# Patient Record
Sex: Female | Born: 1960
Health system: Southern US, Community
[De-identification: ages and names within clinical notes are randomized; demographics above are authoritative.]

## PROBLEM LIST (undated history)

## (undated) DIAGNOSIS — R112 Nausea with vomiting, unspecified: Secondary | ICD-10-CM

## (undated) DIAGNOSIS — E041 Nontoxic single thyroid nodule: Secondary | ICD-10-CM

## (undated) DIAGNOSIS — M8580 Other specified disorders of bone density and structure, unspecified site: Secondary | ICD-10-CM

## (undated) DIAGNOSIS — K589 Irritable bowel syndrome without diarrhea: Secondary | ICD-10-CM

## (undated) DIAGNOSIS — T7840XA Allergy, unspecified, initial encounter: Secondary | ICD-10-CM

## (undated) DIAGNOSIS — E785 Hyperlipidemia, unspecified: Secondary | ICD-10-CM

## (undated) DIAGNOSIS — C801 Malignant (primary) neoplasm, unspecified: Secondary | ICD-10-CM

## (undated) DIAGNOSIS — Z9889 Other specified postprocedural states: Secondary | ICD-10-CM

## (undated) HISTORY — DX: Allergy, unspecified, initial encounter: T78.40XA

## (undated) HISTORY — DX: Irritable bowel syndrome, unspecified: K58.9

## (undated) HISTORY — DX: Hyperlipidemia, unspecified: E78.5

## (undated) HISTORY — DX: Other specified disorders of bone density and structure, unspecified site: M85.80

## (undated) HISTORY — DX: Malignant (primary) neoplasm, unspecified: C80.1

---

## 1986-10-16 HISTORY — PX: DILATION AND CURETTAGE OF UTERUS: SHX78

## 1989-10-16 HISTORY — PX: ABDOMINAL HYSTERECTOMY: SHX81

## 2000-08-16 ENCOUNTER — Encounter: Admission: RE | Admit: 2000-08-16 | Discharge: 2000-08-16 | Payer: Self-pay | Admitting: Family Medicine

## 2000-08-16 ENCOUNTER — Encounter: Payer: Self-pay | Admitting: Family Medicine

## 2001-06-26 ENCOUNTER — Other Ambulatory Visit: Admission: RE | Admit: 2001-06-26 | Discharge: 2001-06-26 | Payer: Self-pay | Admitting: Obstetrics and Gynecology

## 2003-12-08 ENCOUNTER — Encounter: Admission: RE | Admit: 2003-12-08 | Discharge: 2003-12-08 | Payer: Self-pay | Admitting: Family Medicine

## 2003-12-10 ENCOUNTER — Other Ambulatory Visit: Admission: RE | Admit: 2003-12-10 | Discharge: 2003-12-10 | Payer: Self-pay | Admitting: Pediatrics

## 2004-12-08 ENCOUNTER — Encounter: Admission: RE | Admit: 2004-12-08 | Discharge: 2004-12-08 | Payer: Self-pay | Admitting: Family Medicine

## 2005-03-29 ENCOUNTER — Ambulatory Visit: Payer: Self-pay | Admitting: Internal Medicine

## 2005-04-28 ENCOUNTER — Encounter: Admission: RE | Admit: 2005-04-28 | Discharge: 2005-04-28 | Payer: Self-pay | Admitting: Obstetrics and Gynecology

## 2005-04-28 IMAGING — US US PELVIS COMPLETE MODIFY
1 series · 13 of 25 positions shown · non-contrast
Comparison: None.

CLINICAL DATA: 44-year-old female, status-post hysterectomy with pelvic pain. 
TRANSABDOMINAL AND TRANSVAGINAL PELVIC ULTRASOUND:
TECHNIQUE: Both transabdominal and transvaginal ultrasound examinations of the pelvis were performed including evaluation of the uterus, ovaries, adnexal regions, and pelvic cul-de-sac.

[Series 1: unknown · 0.29mm/px · 13 of 59 slices shown]
[im 1/59]
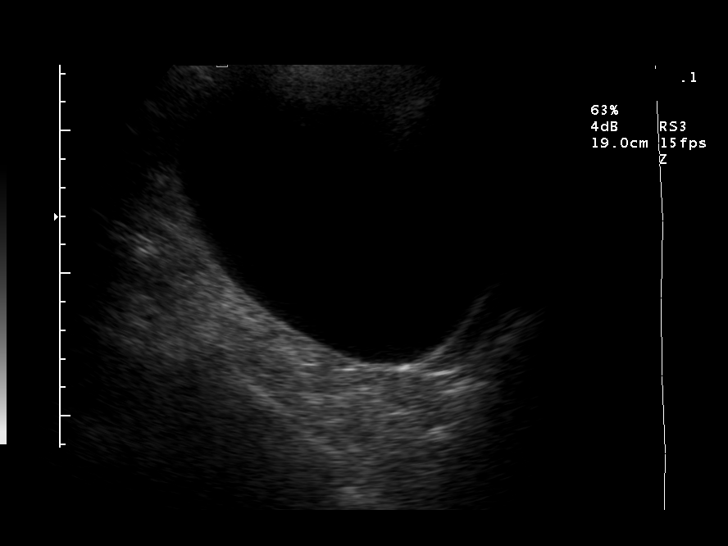
[im 5/59]
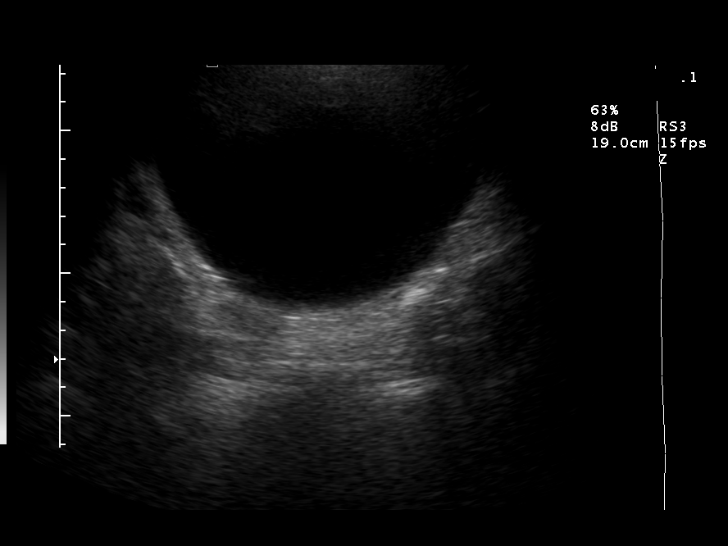
[im 10/59]
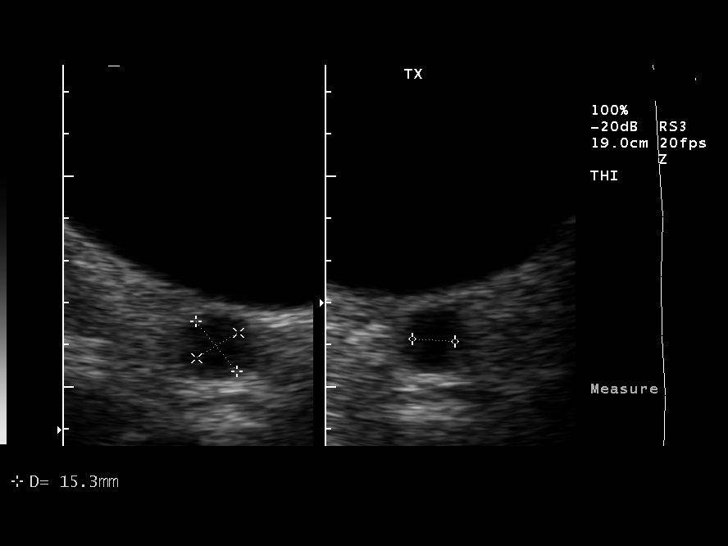
[im 15/59]
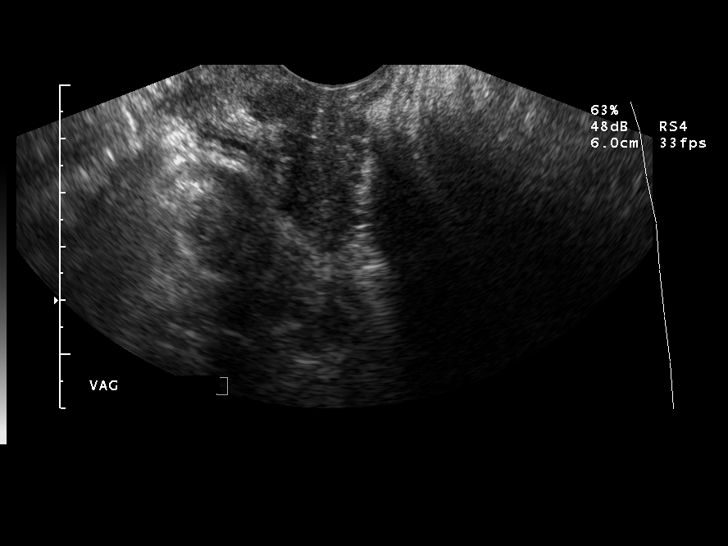
[im 20/59]
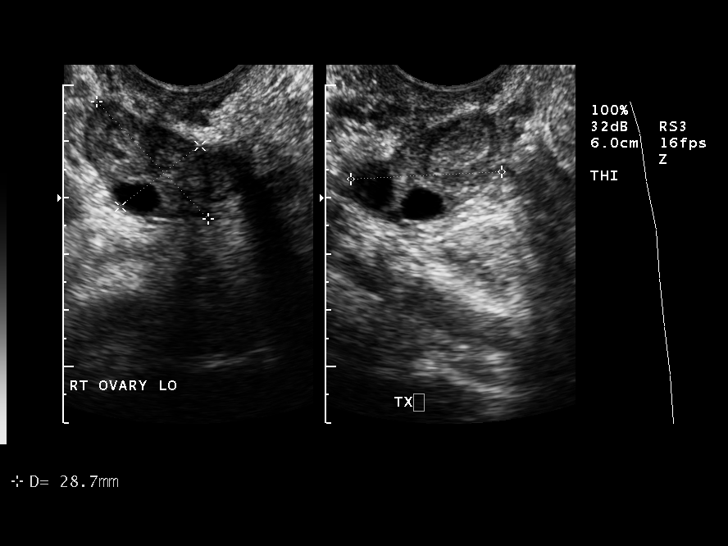
[im 25/59]
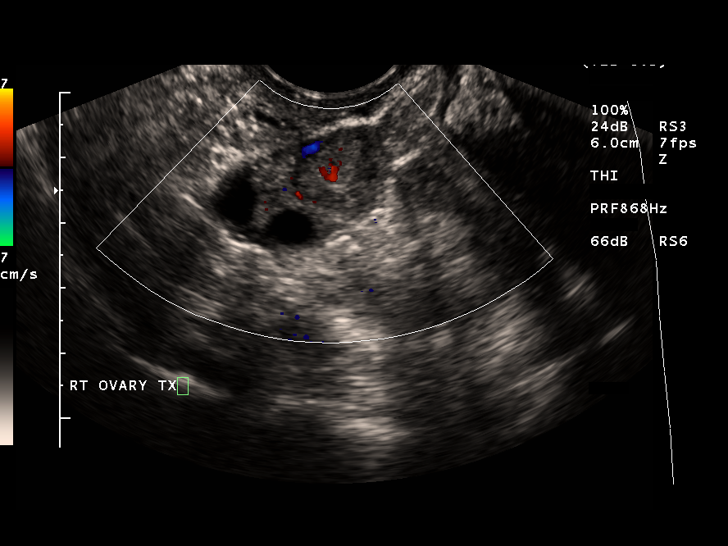
[im 30/59]
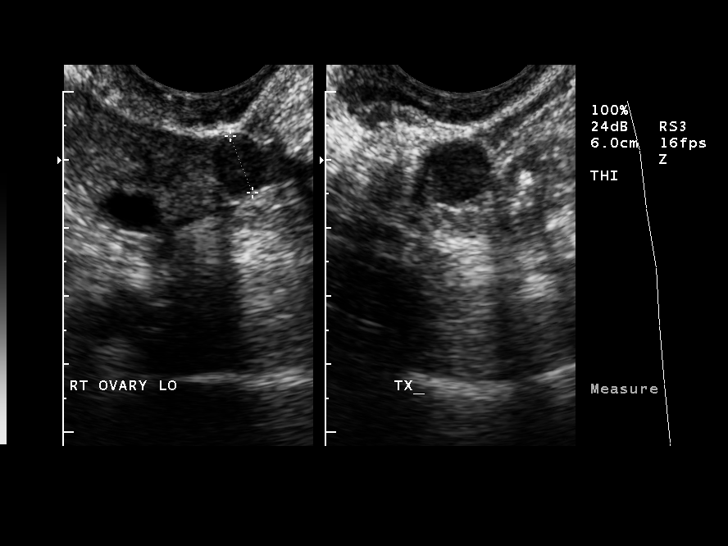
[im 34/59]
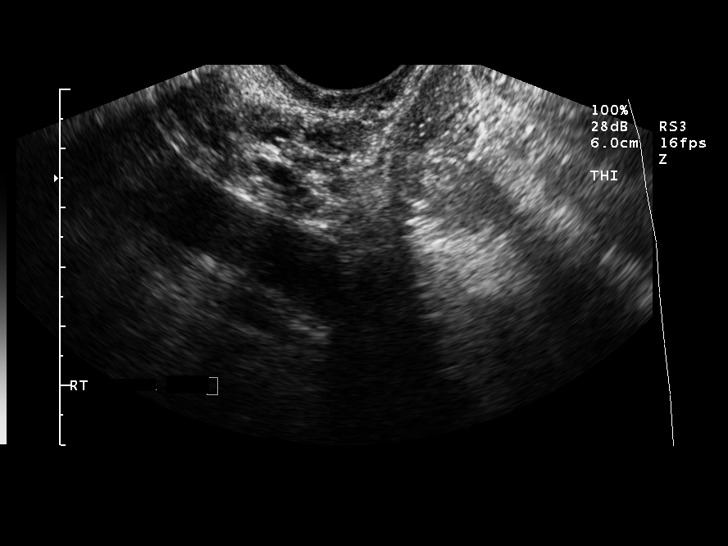
[im 39/59]
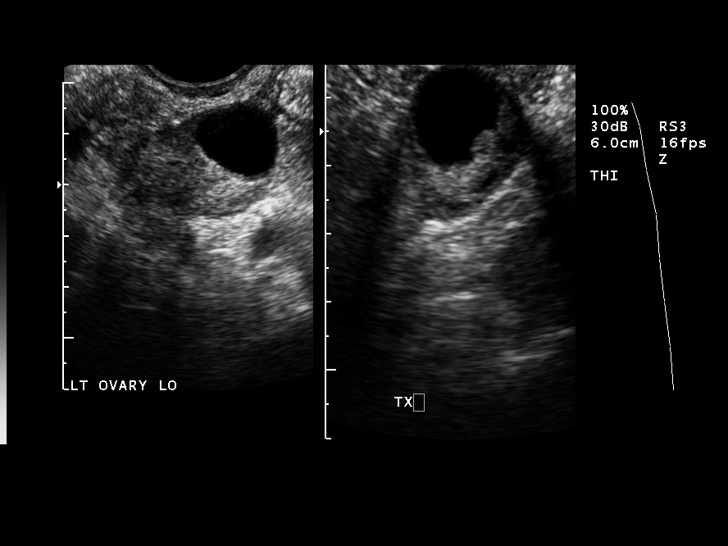
[im 44/59]
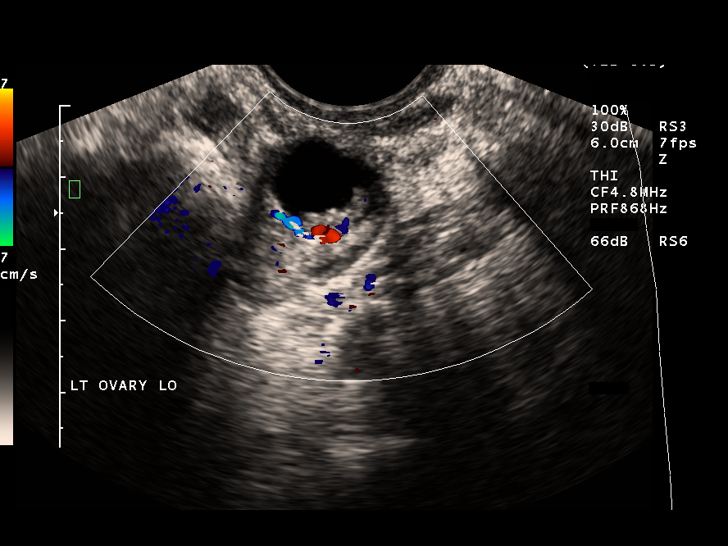
[im 49/59]
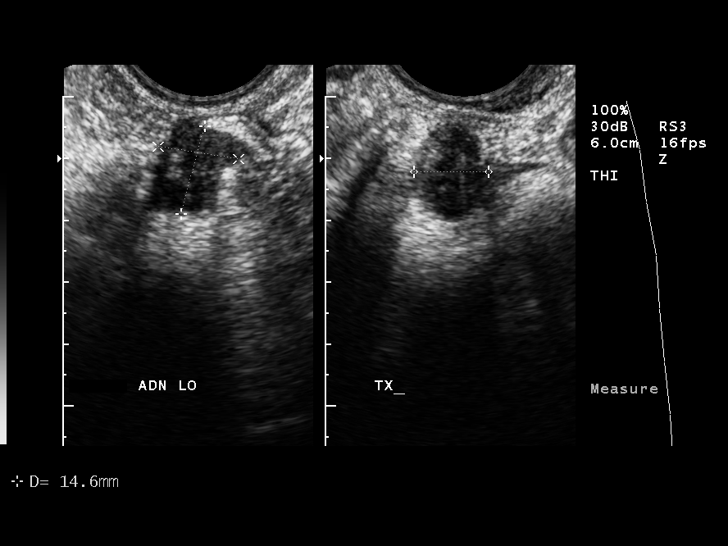
[im 54/59]
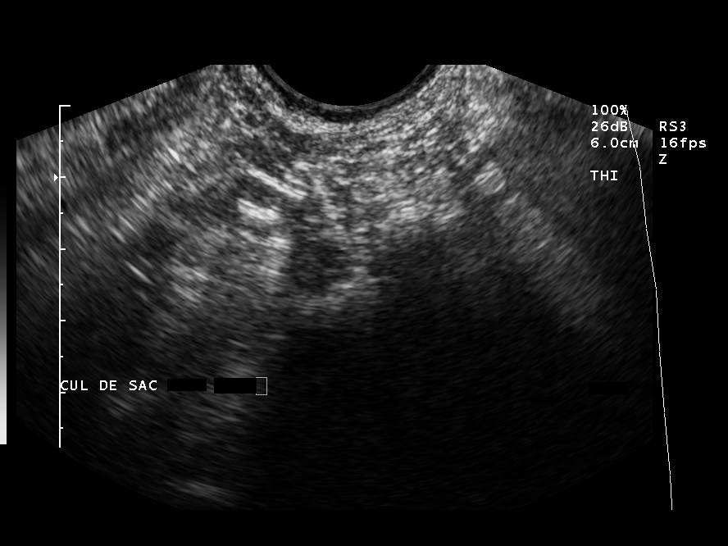
[im 59/59]
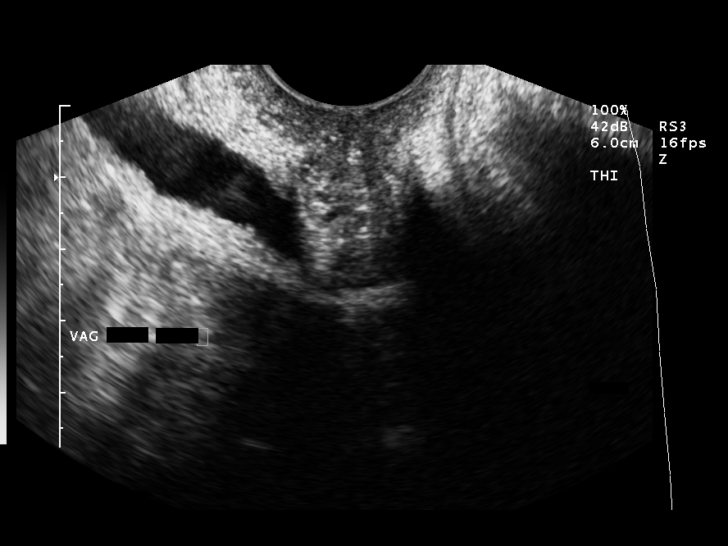

[13 of 25 positions shown; findings below may reference images not displayed]

FINDINGS: The uterus is surgically absent.  
  The right ovary measures 2.9 x 1.8 x 2.7 cm.  Within the right ovary, there is a 16 x 15 x 14 mm hypoechoic solid-appearing area which is non-specific in appearance and may represent a hemorrhagic or debris filled ovarian follicle versus small cyst.  
The left ovary measures 3.3 x 2.0 x 2.0 cm.  The left ovary contains a 19 x 15 x 16 mm complex cyst with septation and an area of marginal nodularity versus debris.  
Additionally, adjacent to the left ovary but separate, there is a 15 x 13 x 12 mm hypoechoic mid  left adnexal abnormality without significant vascularity however this does not appear to peristalse like bowel.  Adjacent adnexal masse is not excluded. 
No free fluid.
IMPRESSION: 1.  Surgically absent uterus.  
2.  16 mm right ovarian hypoechoic lesion, query hemorrhagic or debris-filled cyst.  
3.  19 mm complex left ovarian septated cyst with peripheral nodular component versus layering debris.  
4.  15 mm midleft adnexal paraovarian hypoechoic lesion which appears separate from the ovary.  Adnexal mass is not excluded. 
5.  Further evaluation could be performed with pelvic MRI or followup ultrasound following two menstrual cycles.  l

## 2005-09-01 ENCOUNTER — Ambulatory Visit: Payer: Self-pay | Admitting: Internal Medicine

## 2005-12-12 ENCOUNTER — Encounter: Admission: RE | Admit: 2005-12-12 | Discharge: 2005-12-12 | Payer: Self-pay | Admitting: Obstetrics and Gynecology

## 2005-12-12 IMAGING — MG MM MAMMO SCREENING
4 series · 4 of 4 positions shown · non-contrast
Comparison: none

SCREENING MAMMOGRAM:
There is a fibroglandular pattern.  A possible mass is noted in the left breast.  Spot compression 
views and possibly sonography are recommended for further evaluation.  In the right breast, no 
masses or malignant type calcifications are identified.  Compared with prior studies.

[R CC]
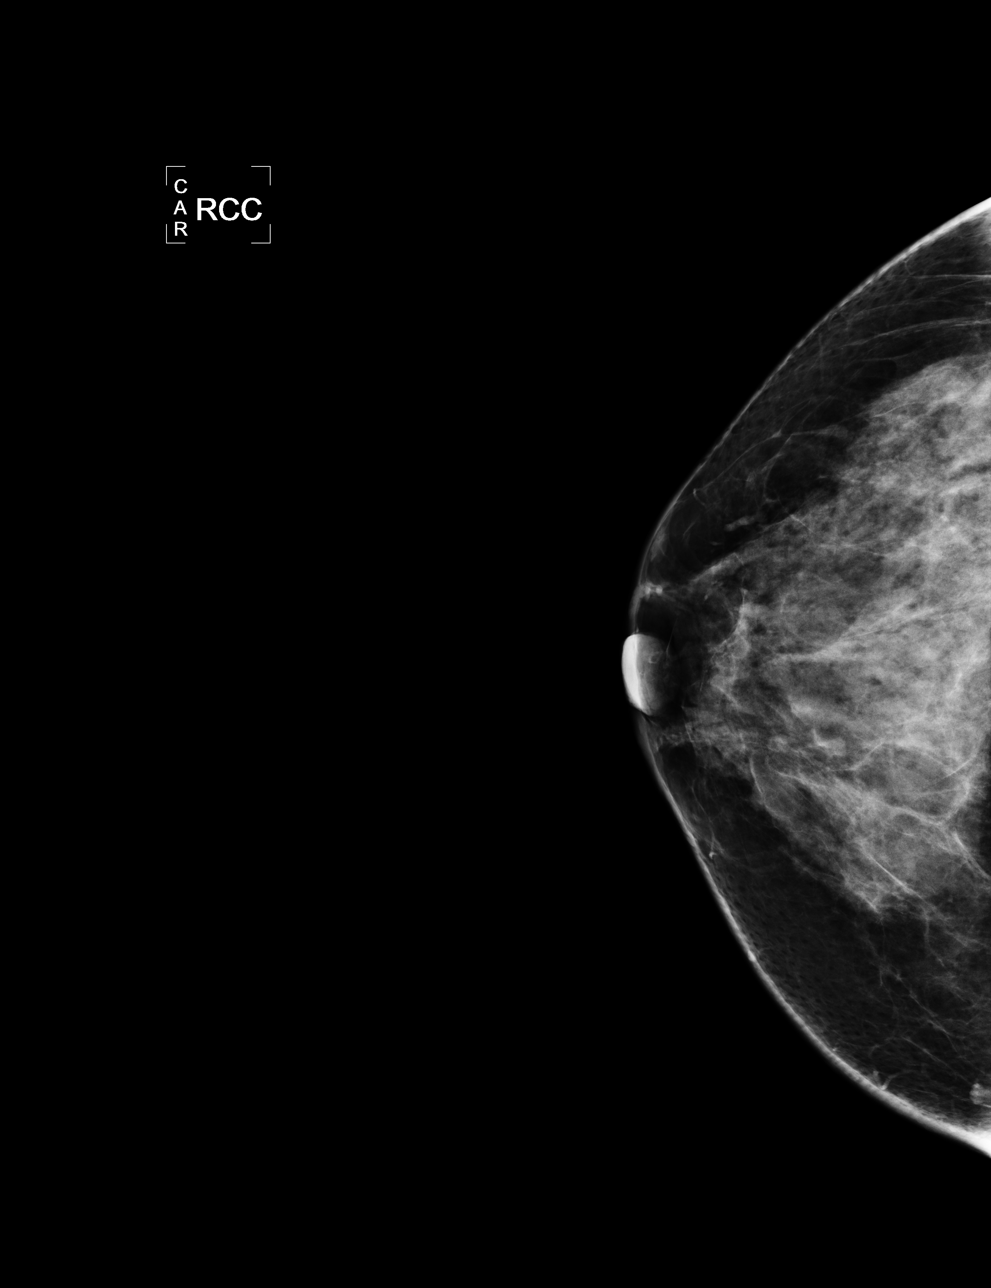

[L CC]
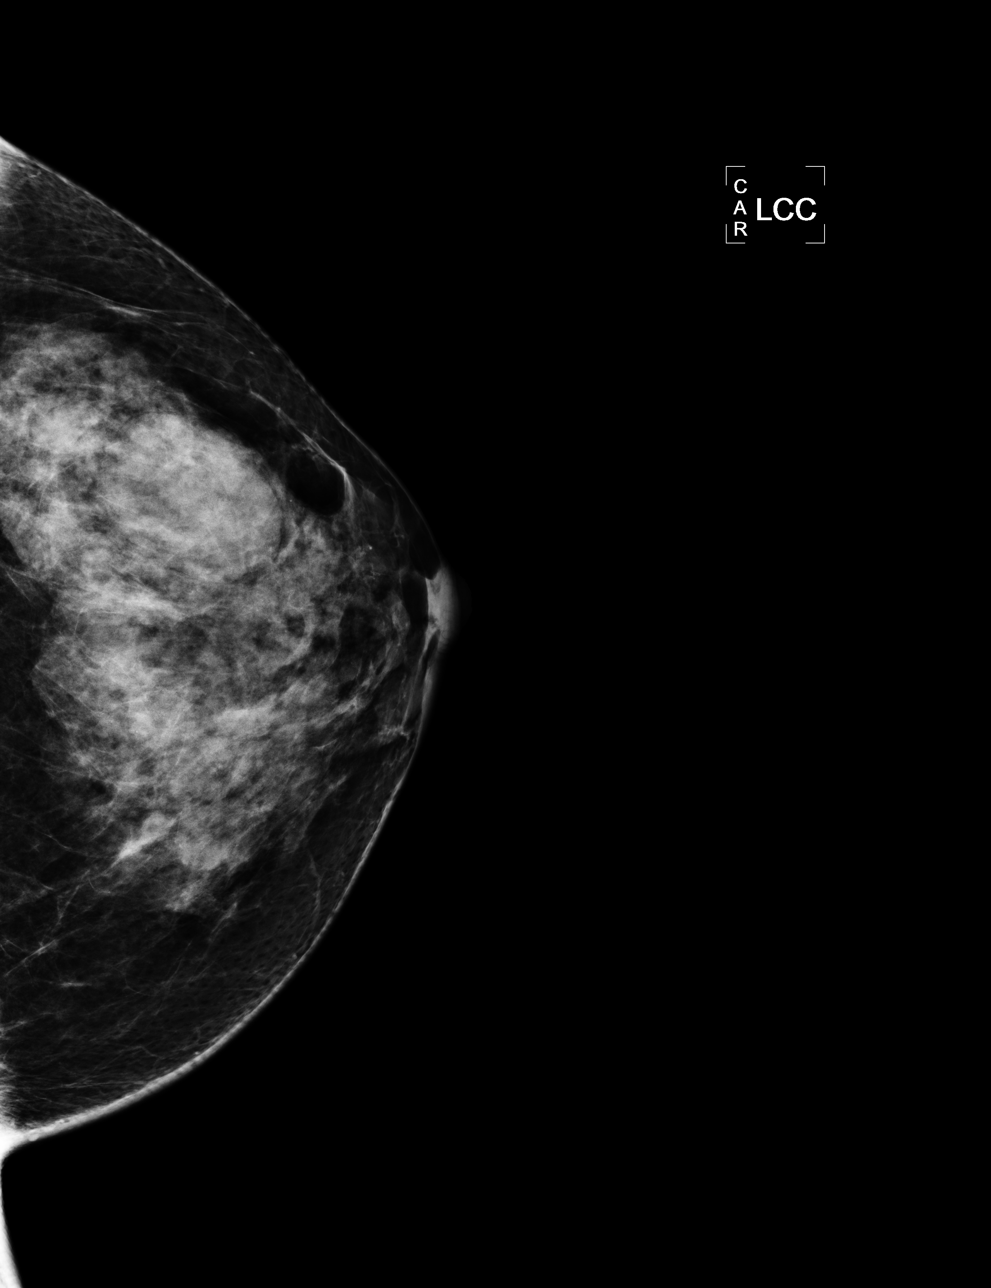

[L MLO]
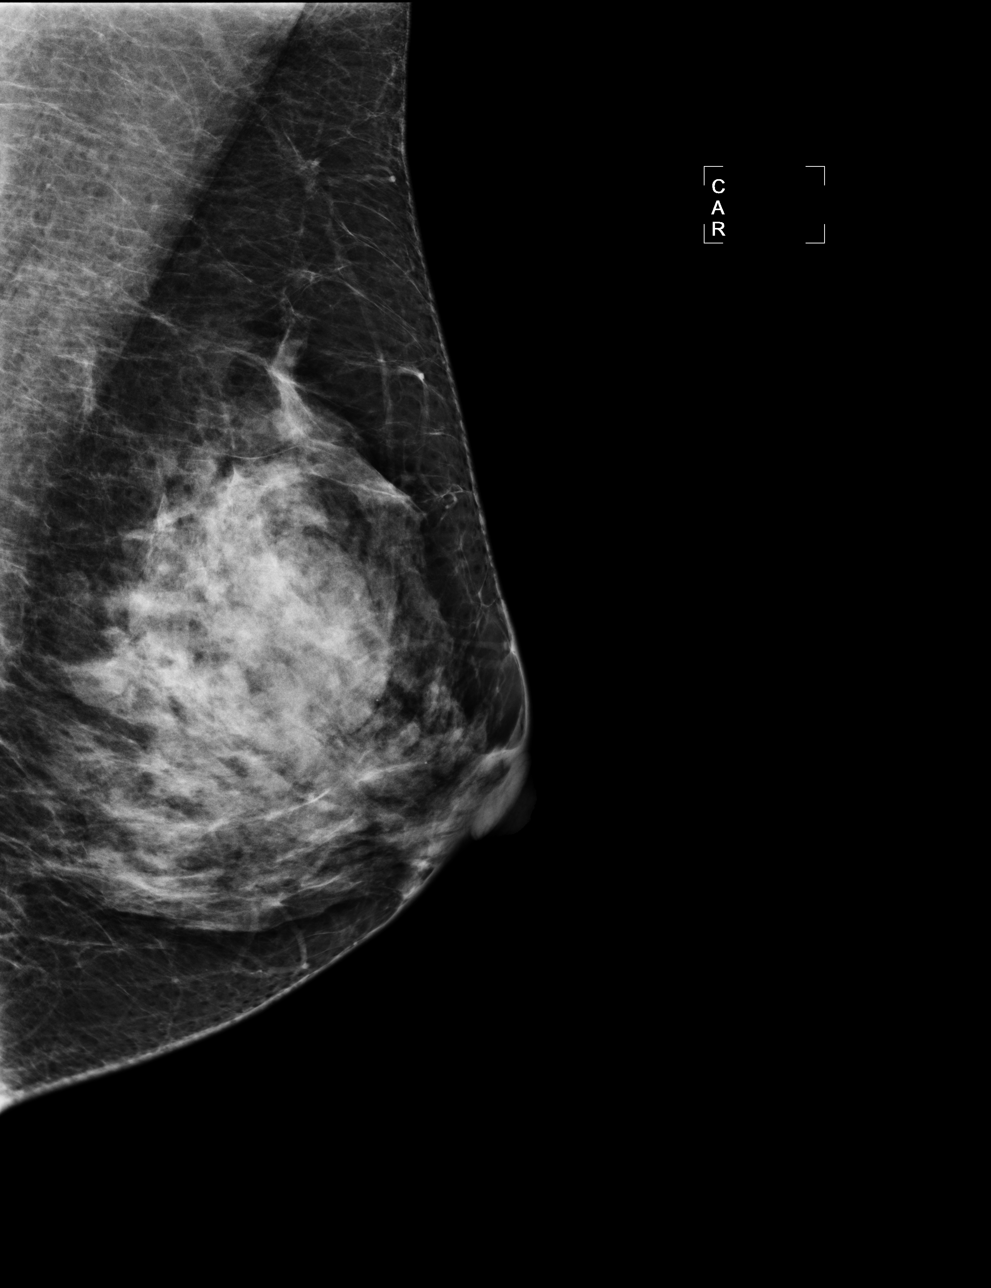

[R MLO]
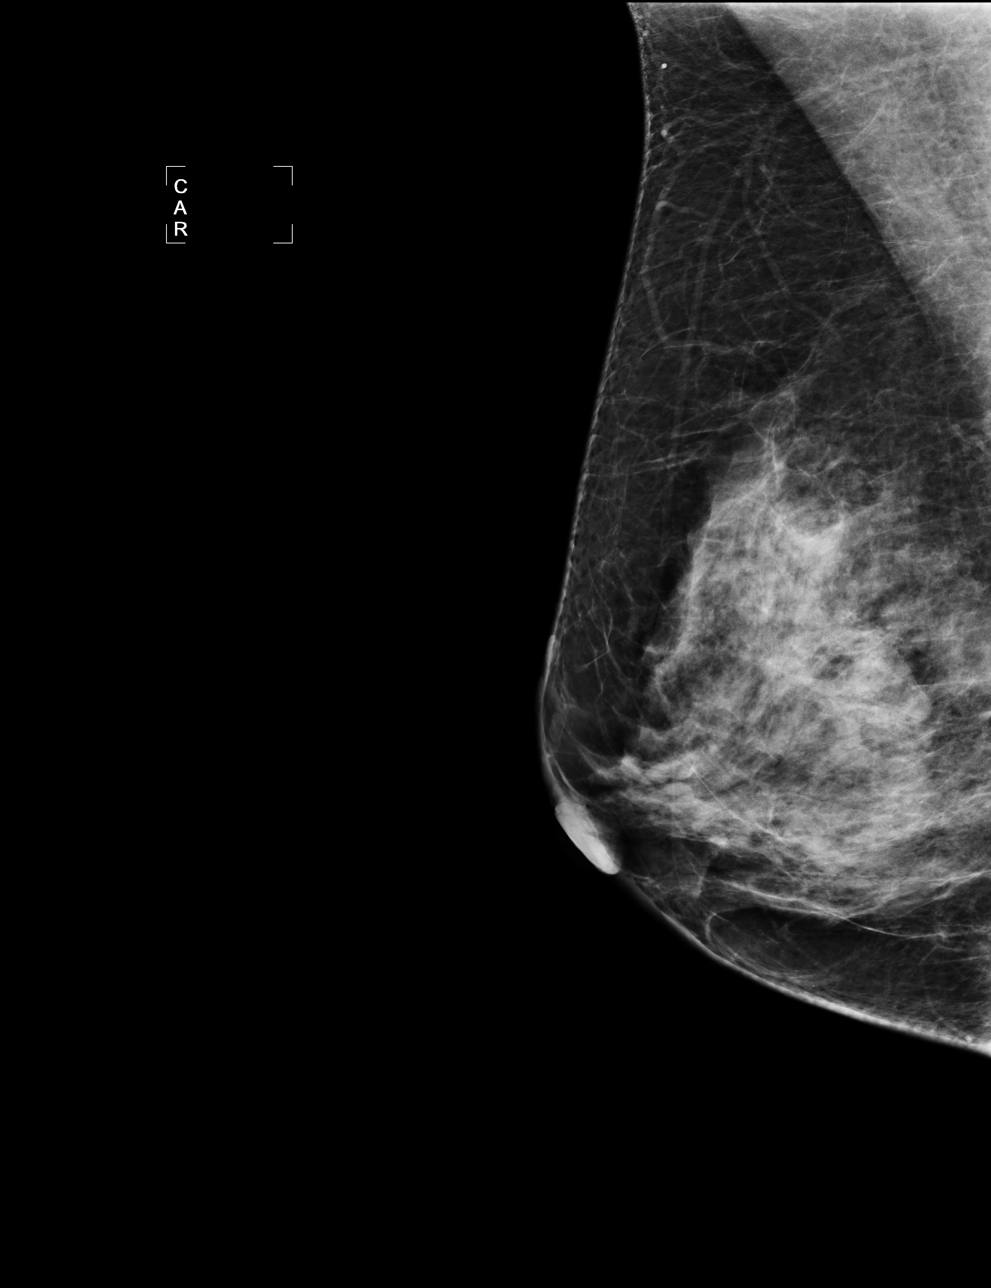

[4 of 4 positions shown; findings below may reference images not displayed]

IMPRESSION: Possible mass, left breast.  Additional evaluation is indicated.  The patient will be contacted for
additional studies and a supplementary report will follow.  No specific mammographic evidence of 
malignancy, right breast.

ASSESSMENT: Need additional imaging evaluation and/or prior mammograms for comparison - BI-RADS 0

Further imaging of the left breast.

## 2005-12-25 ENCOUNTER — Encounter: Admission: RE | Admit: 2005-12-25 | Discharge: 2005-12-25 | Payer: Self-pay | Admitting: Obstetrics and Gynecology

## 2005-12-25 IMAGING — US UNKNOWN US STUDY
1 series · 4 of 4 positions shown · non-contrast
Comparison: none

LEFT BREAST ULTRASOUND:
CLINICAL DATA: The patient returns for evaluation of an obscured mass in the left upper outer 
quadrant noted on screening study of [DATE].

On physical examination, I palpate a mobile 3 cm mass at 2 o'clock in zone 2 on the left.  
Sonography demonstrates a simple cyst in this location measuring 3.0 x 3.1 x 1.3 cm.  No solid 
mass, distortion or shadowing to suggest malignancy is identified.

[Series 1: unknown us study · 4 of 4 slices shown]
[im 1/4]
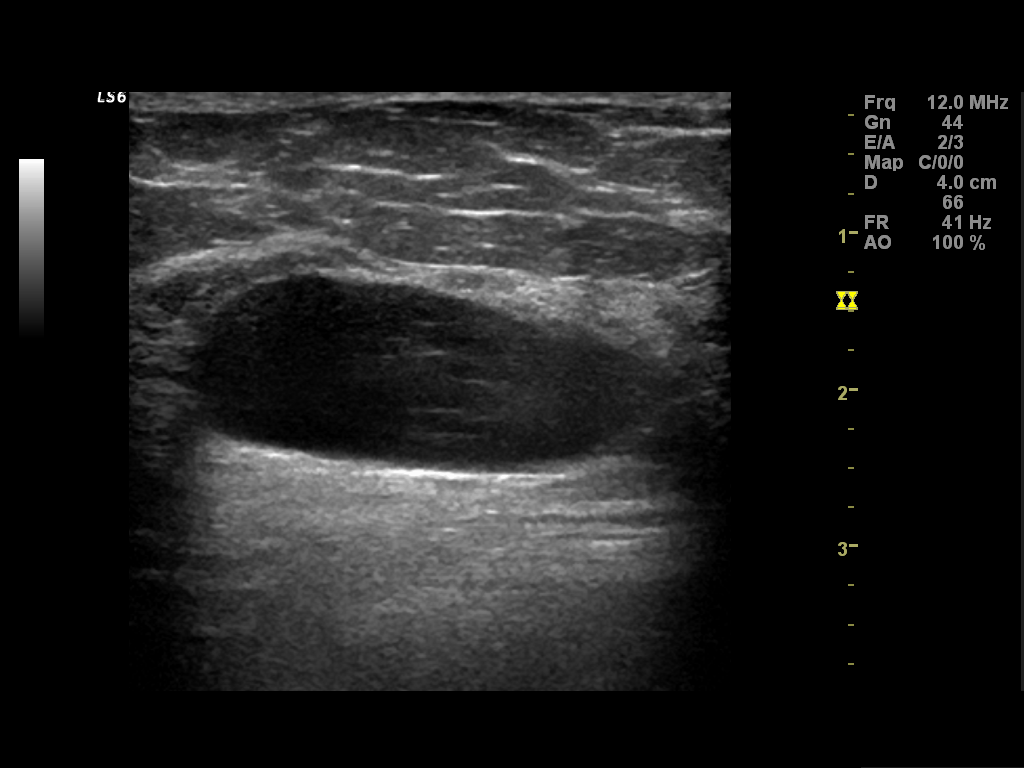
[im 2/4]
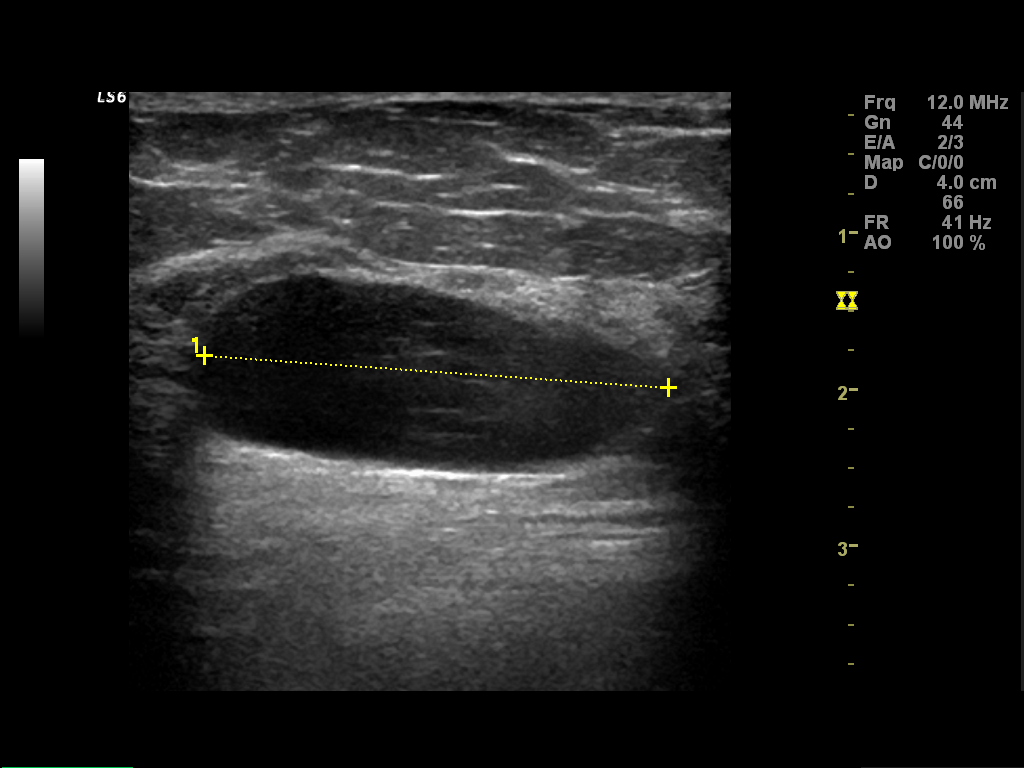
[im 3/4]
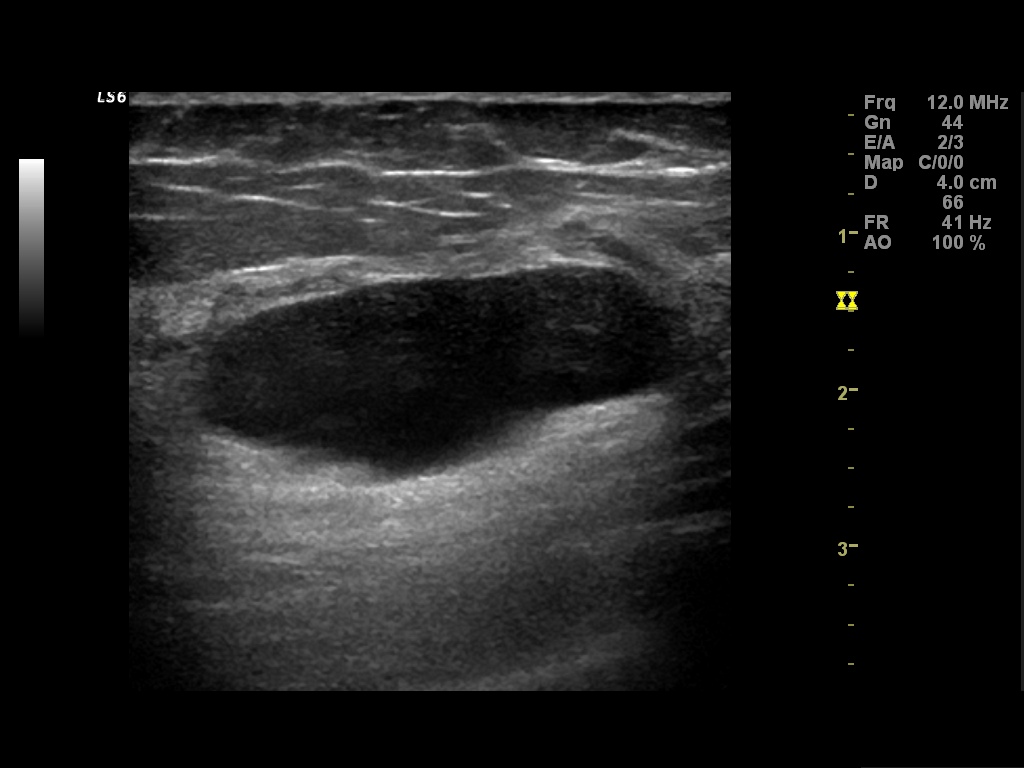
[im 4/4]
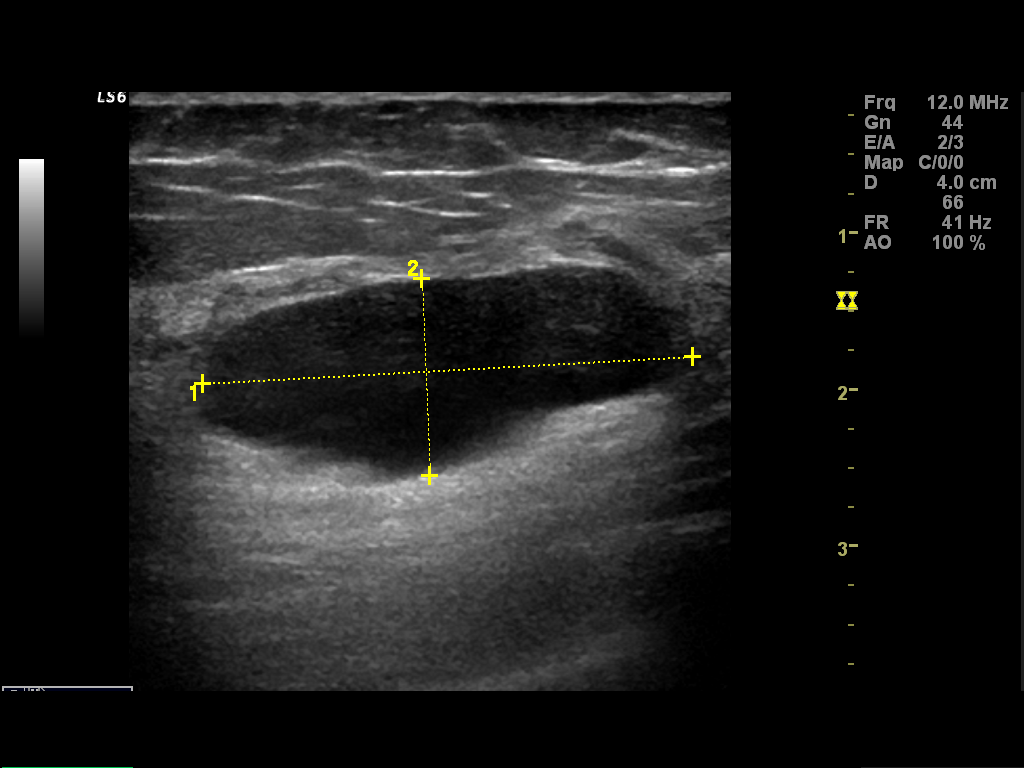

[4 of 4 positions shown; findings below may reference images not displayed]

IMPRESSION: The obscured mass noted on the recent screening mammogram corresponds to a simple cyst at 
sonography.  No mammographic or sonographic evidence of malignancy.  Yearly screening mammography 
is suggested.

ASSESSMENT: Benign - BI-RADS 2

Screening mammogram of both breasts in 1 year.

## 2006-05-29 ENCOUNTER — Encounter: Admission: RE | Admit: 2006-05-29 | Discharge: 2006-05-29 | Payer: Self-pay | Admitting: Internal Medicine

## 2006-05-29 IMAGING — US US SOFT TISSUE HEAD/NECK
1 series · 13 of 25 positions shown · non-contrast
Comparison: Prior thyroid ultrasound performed at [HOSPITAL] dated [DATE] and report and results from a left thyroid nodule needle aspirate biopsy performed at [HOSPITAL] on [DATE].

CLINICAL DATA: Thyroid enlargement with history of left thyroid nodule and previous left thyroid nodule needle aspiration.  The patient complains of periodic dysphagia with sensation of food stuck in her throat. 
SOFT TISSUE HEAD/NECK ULTRASOUND:
TECHNIQUE: Ultrasound examination of the soft tissues was performed in the area of clinical concern.

[Series 1: unknown · 0.07mm/px · 13 of 44 slices shown]
[im 1/44]
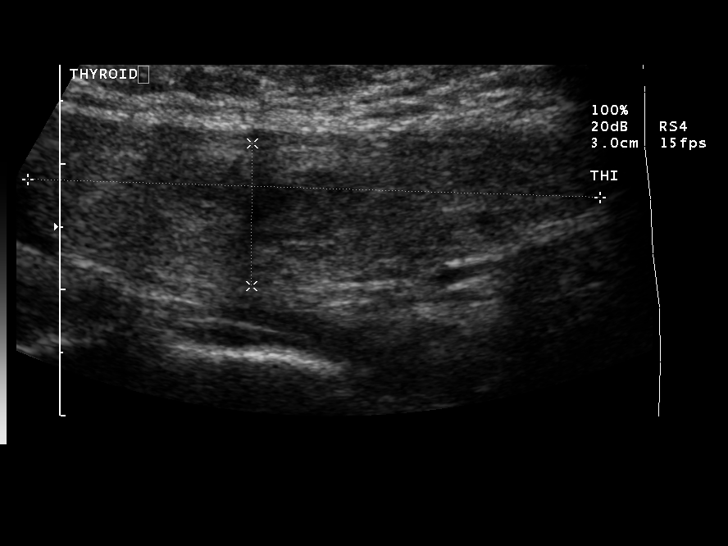
[im 4/44]
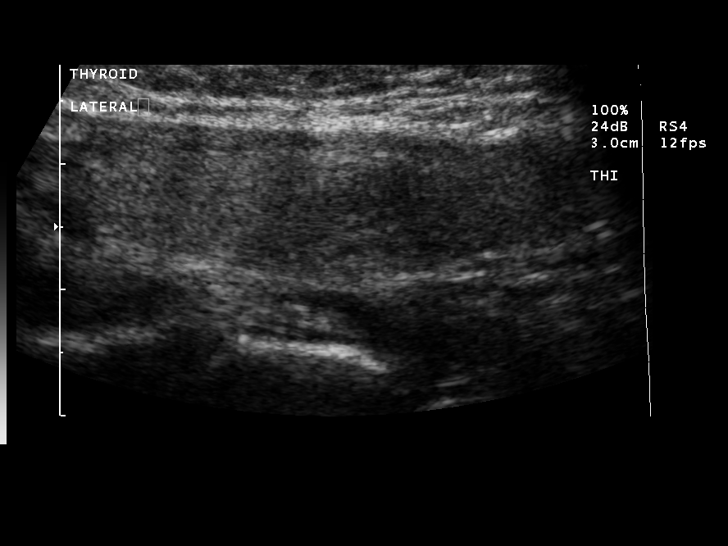
[im 8/44]
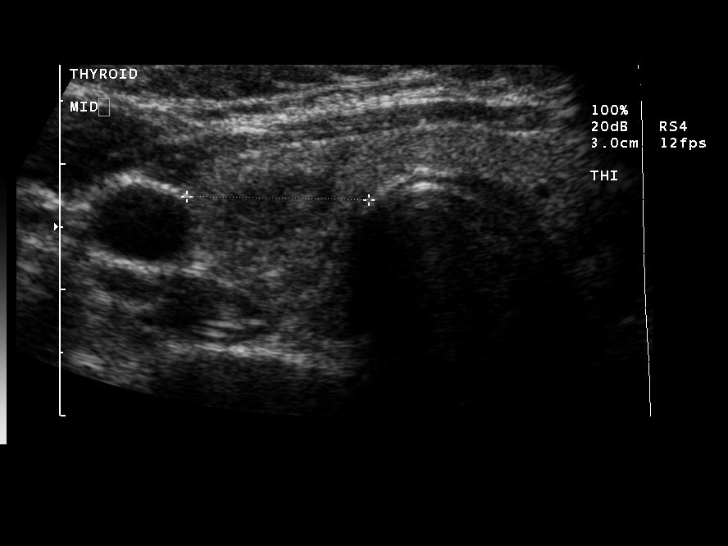
[im 11/44]
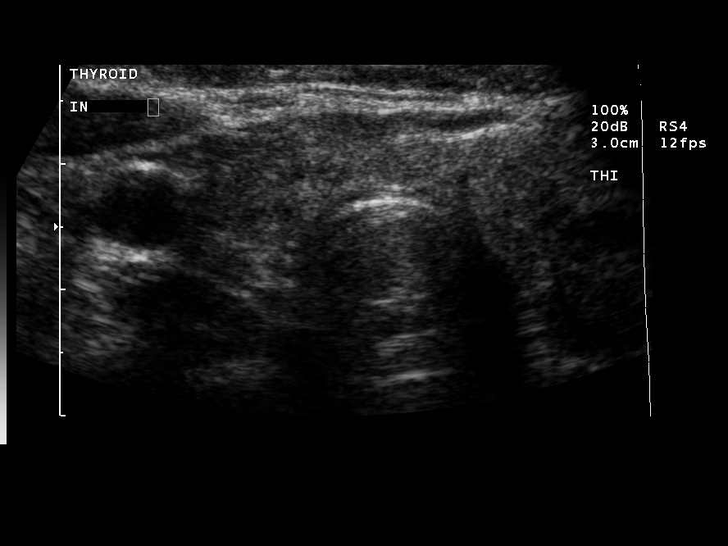
[im 15/44]
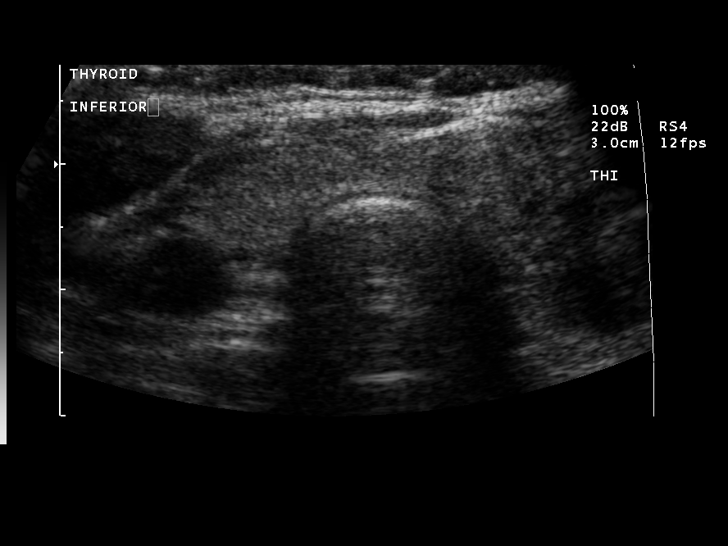
[im 18/44]
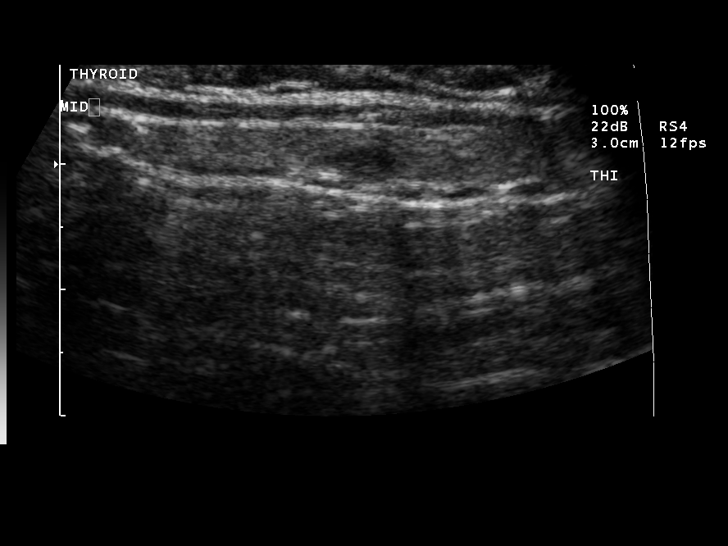
[im 22/44]
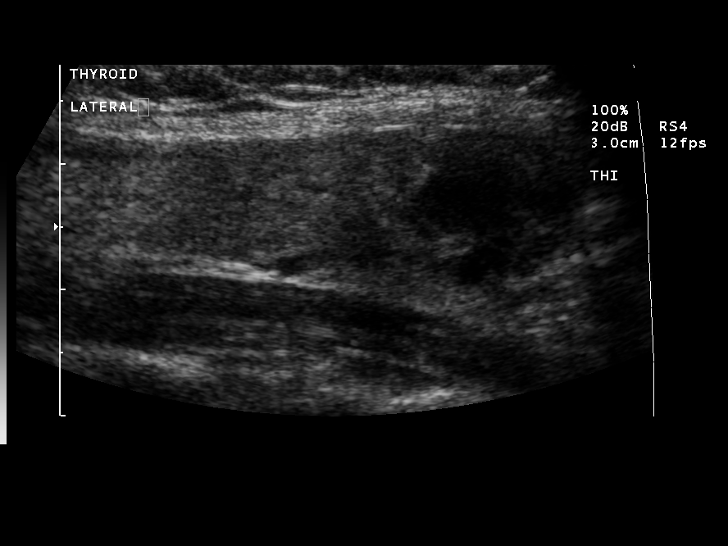
[im 26/44]
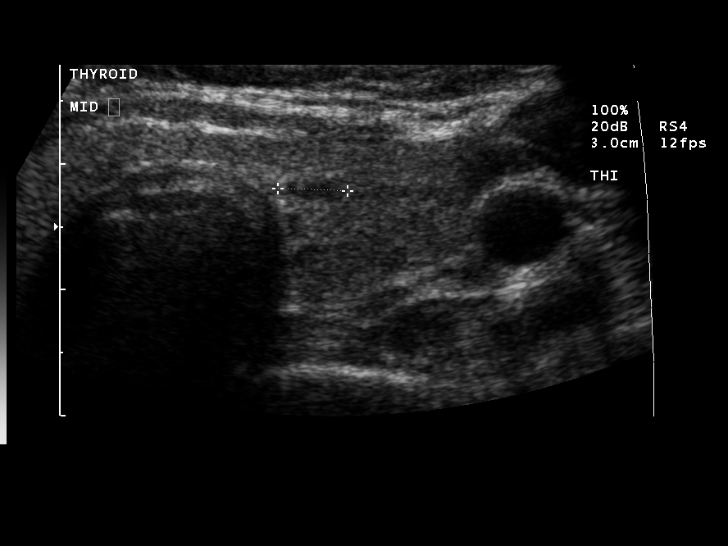
[im 29/44]
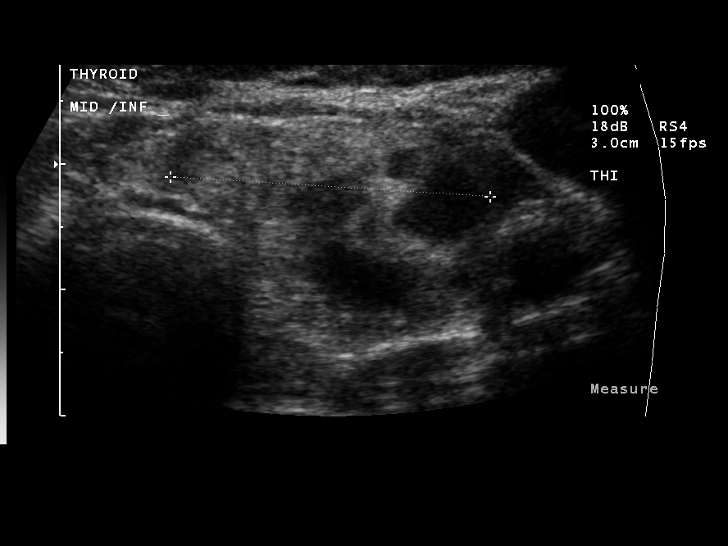
[im 33/44]
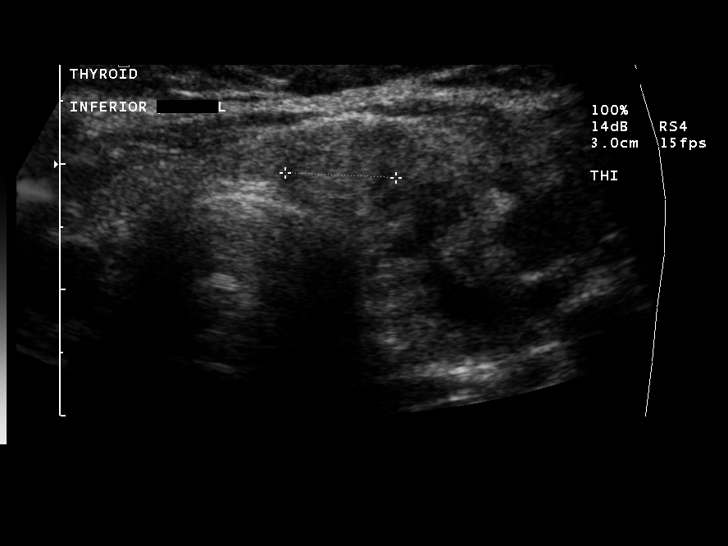
[im 36/44]
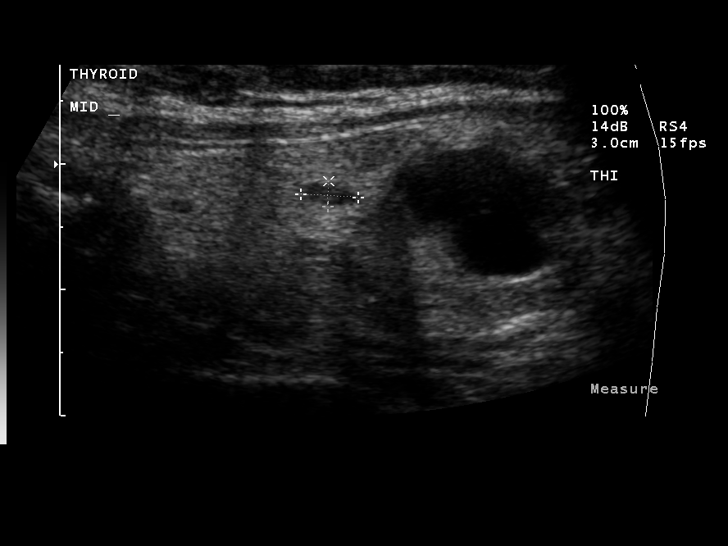
[im 40/44]
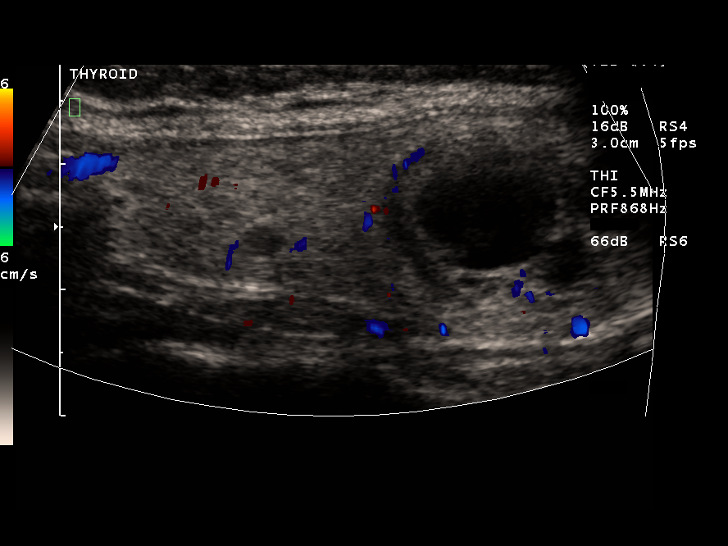
[im 44/44]
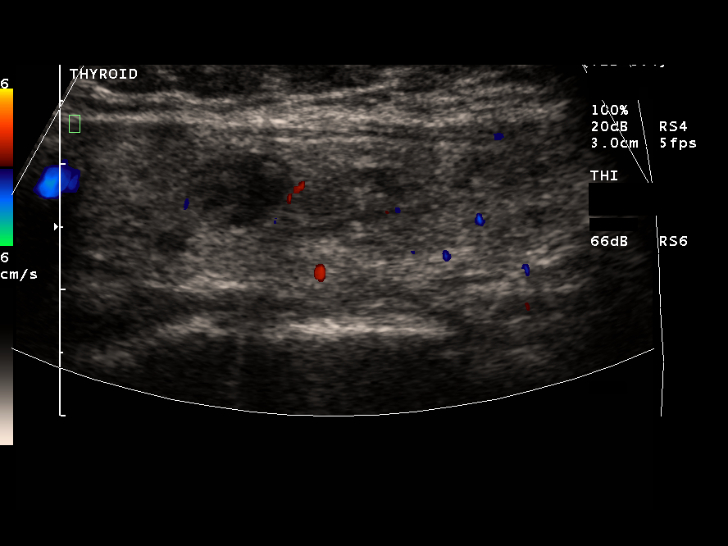

[13 of 25 positions shown; findings below may reference images not displayed]

FINDINGS: The right lobe of the thyroid measures 4.6 x 1.1 x 1.5cm.  The left lobe measures 4.7 x 1.9 x 2.2cm.  The thyroid isthmus measures 4mm in the midline.  Thyroid echotexture is heterogeneous with multiple nodules noted.  There remains a dominant nodule in the inferior aspect of the left lobe which on the current study measures 2.3 x 1.8 x 1.8cm.  This shows little change in overall size since the prior ultrasound examination.  The only change in sonographic appearance is some increase in central cystic component compared to the prior study which showed smaller central cystic areas.  Five other tiny nodular and cystic areas are noted in the left lobe.  The largest measures 1.2cm in greatest diameter.  Others range in size from 5 to 6mm in greatest diameter. 
Three nodular areas are present in the right lobe.  A 9mm predominantly cystic area of nodularity is present in the superior right lobe.  A 10mm predominantly solid area of nodularity is present in the mid portion of the lobe.  A small 5mm solid nodule is present in the inferior right lobe.
IMPRESSION: Overall stable size of a dominant left thyroid nodule.  The only change since the prior ultrasound examination in [5O] is further evidence of central cystic degeneration.  Although the central cystic portion could be sampled and decompressed by needle aspiration, it is doubtful that this would have any affect on the patient?s symptoms given stability of overall size.  The overall size of the gland is also not significantly enlarged.  It may be worth while to obtain a follow up ultrasound to follow thyroid nodules especially if there is any increasing palpable abnormality on physical examination.

## 2006-06-12 ENCOUNTER — Other Ambulatory Visit: Admission: RE | Admit: 2006-06-12 | Discharge: 2006-06-12 | Payer: Self-pay | Admitting: Internal Medicine

## 2006-12-26 ENCOUNTER — Encounter: Admission: RE | Admit: 2006-12-26 | Discharge: 2006-12-26 | Payer: Self-pay | Admitting: Obstetrics and Gynecology

## 2006-12-26 IMAGING — MG MM SCREEN MAMMOGRAM BILATERAL
4 series · 4 of 4 positions shown · non-contrast
Comparison: none

DG SCREEN MAMMOGRAM BILATERAL
Bilateral CC and MLO view(s) were taken.

DIGITAL SCREENING MAMMOGRAM WITH CAD:
There is a dense fibroglandular pattern.  A possible mass is noted in the right breast.  Spot 
compression views and possibly sonography are recommended for further evaluation.  In the left 
breast, no masses or malignant type calcifications are identified.  Compared with prior studies.

[R CC]
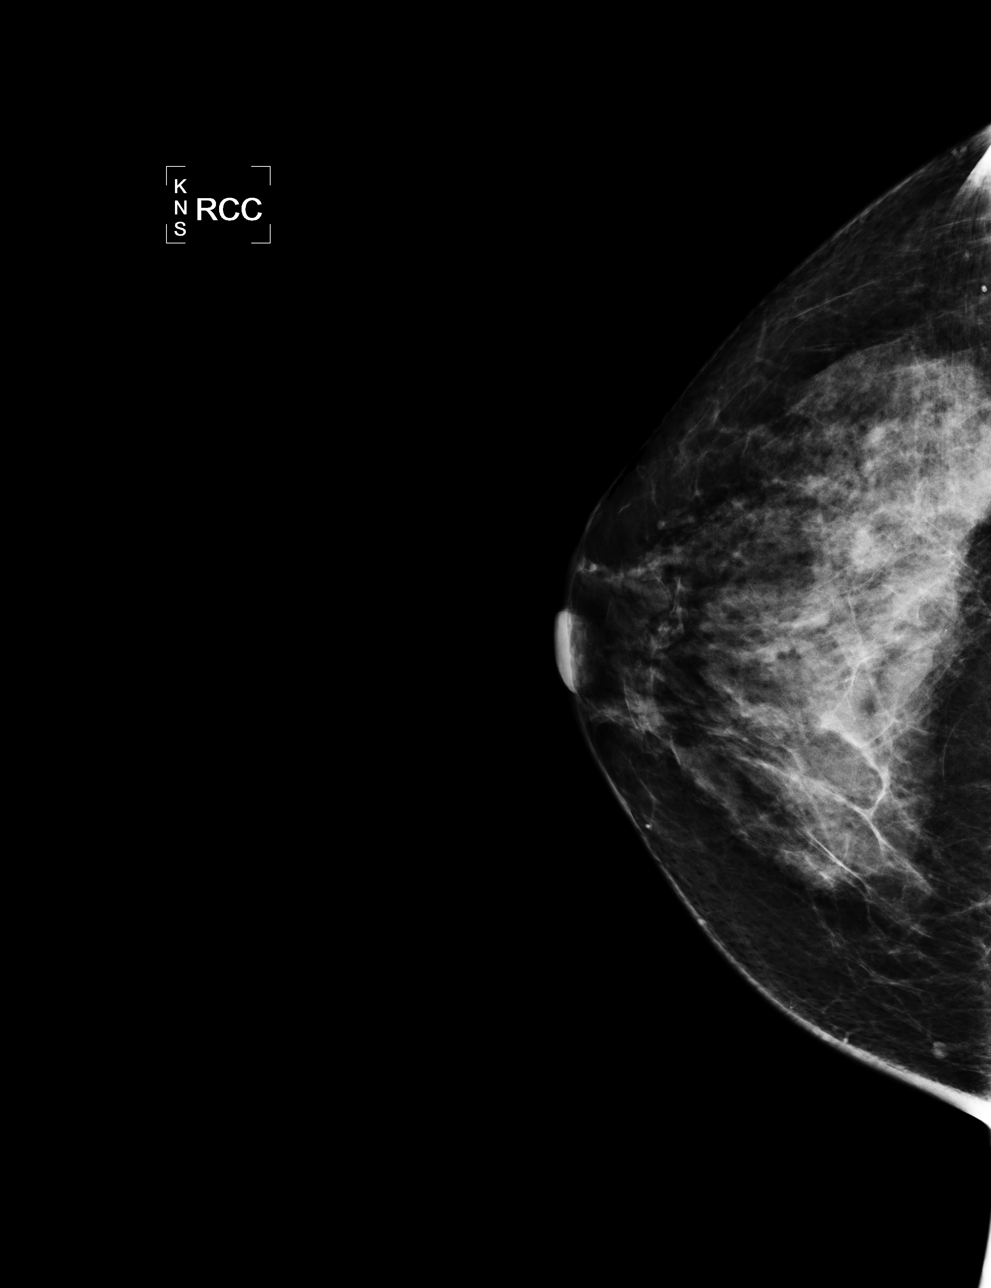

[L CC]
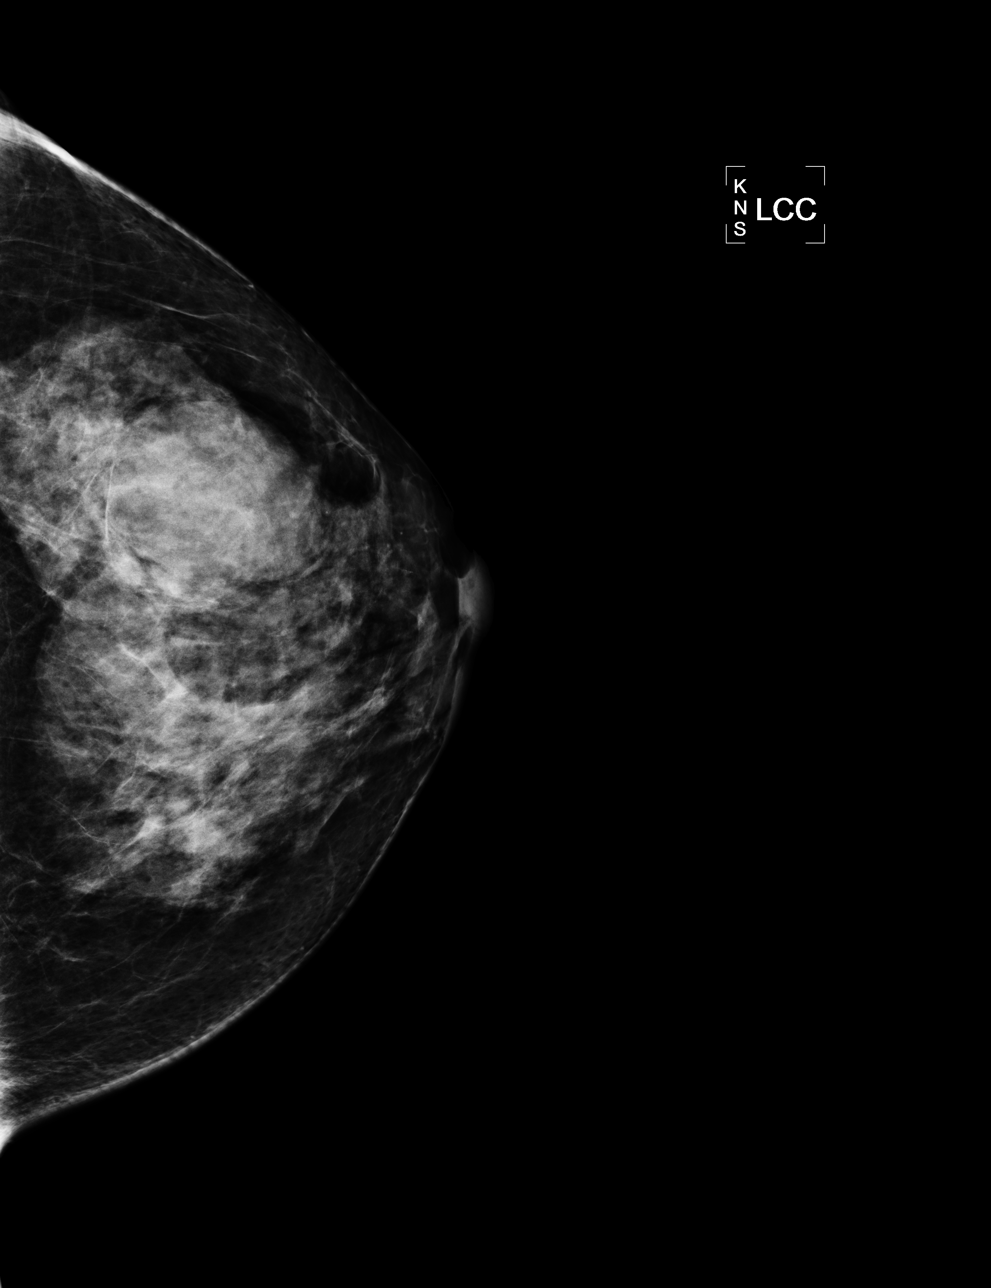

[L MLO]
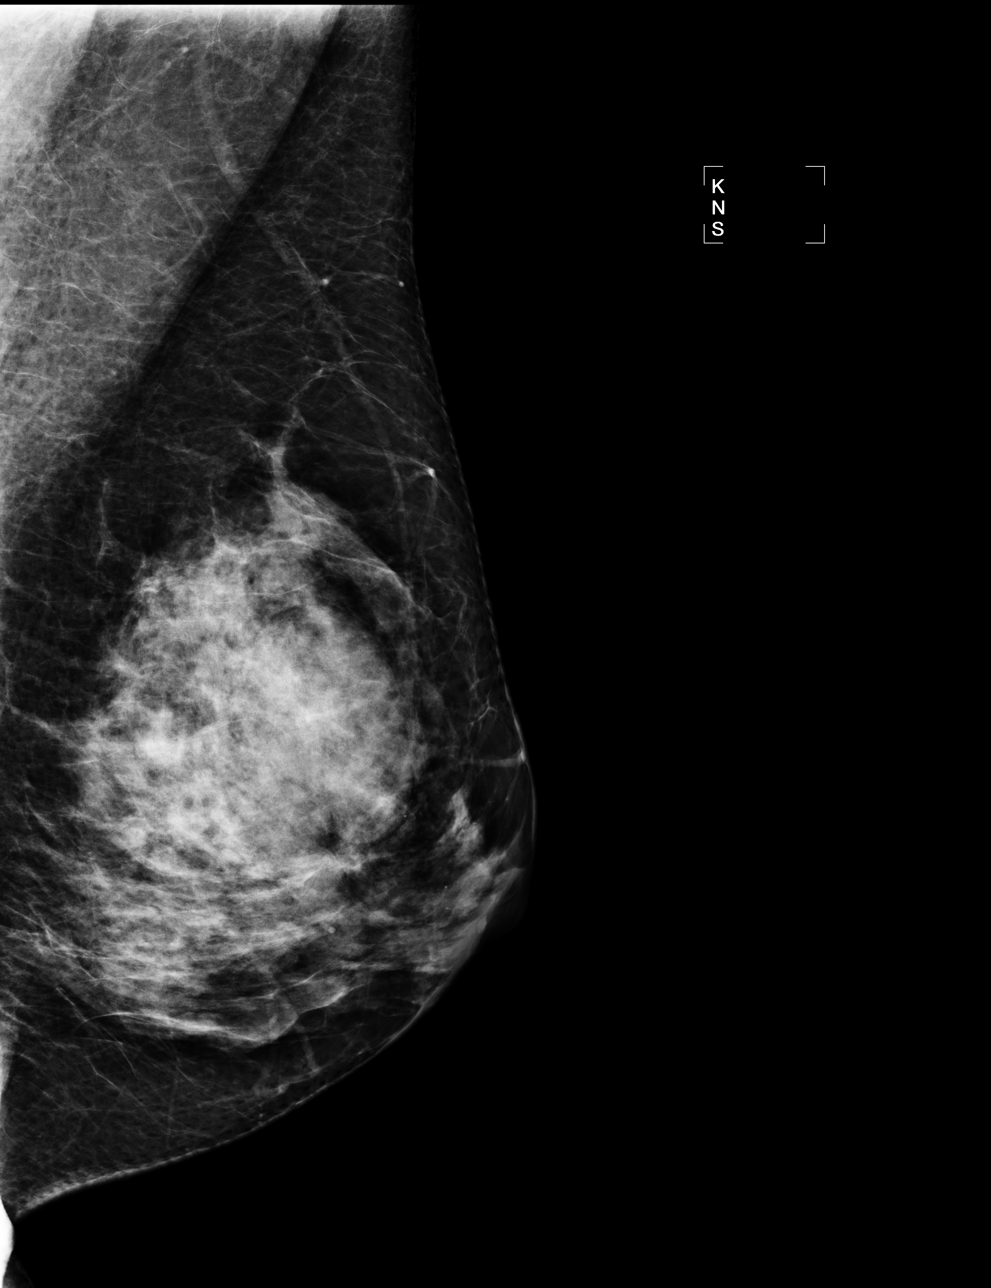

[R MLO]
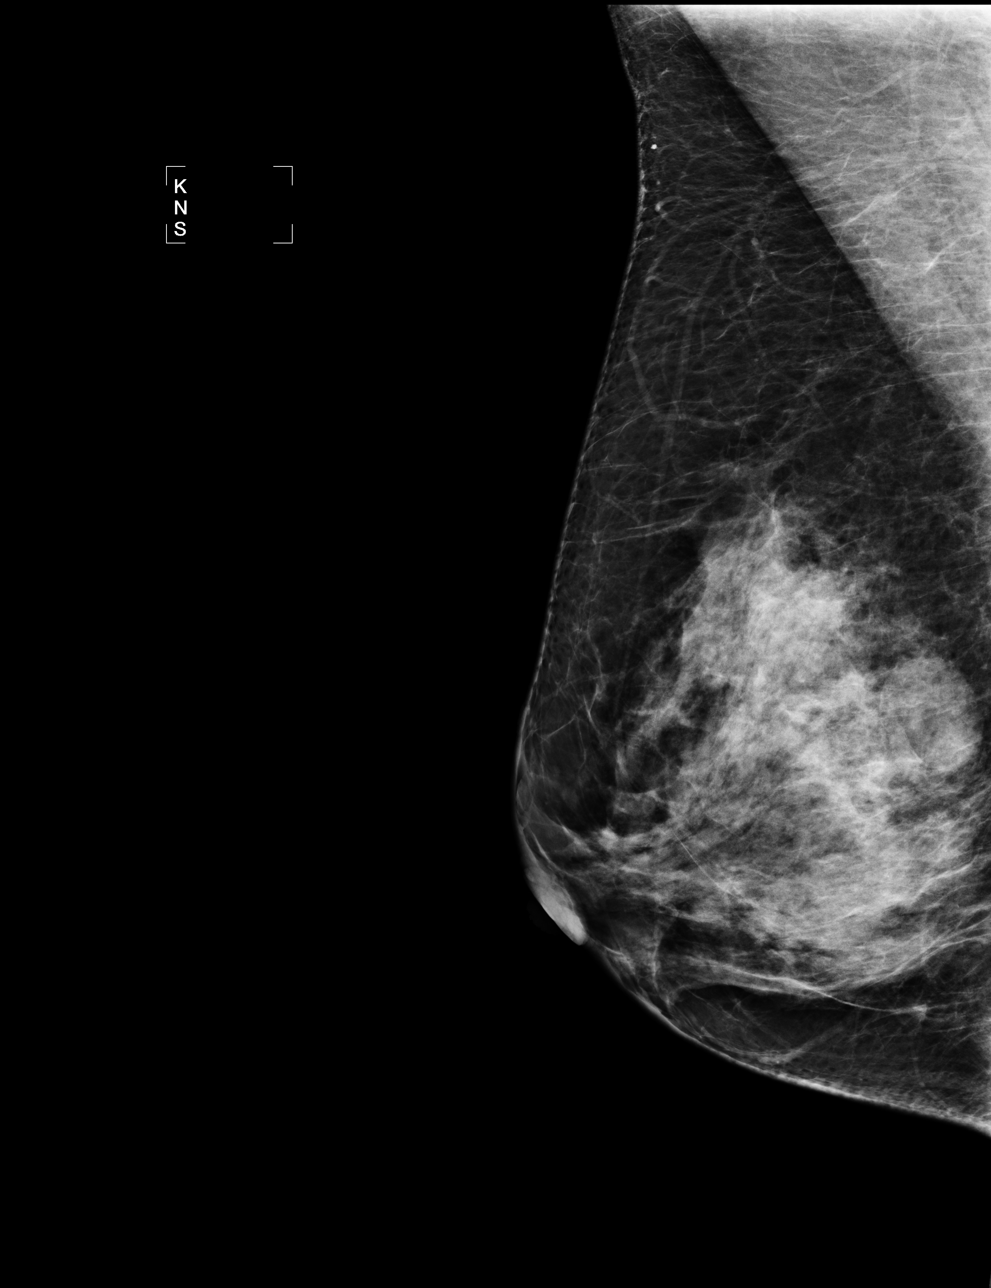

[4 of 4 positions shown; findings below may reference images not displayed]

IMPRESSION: Possible mass, right breast.  Additional evaluation is indicated.  The patient will be contacted 
for additional studies and a supplementary report will follow.  No specific mammographic evidence 
of malignancy, left breast.

ASSESSMENT: Need additional imaging evaluation and/or prior mammograms for comparison - BI-RADS 0

Further imaging of the right breast.
ANALYZED BY COMPUTER AIDED DETECTION. , THIS PROCEDURE WAS A DIGITAL MAMMOGRAM.

## 2007-01-08 ENCOUNTER — Encounter: Admission: RE | Admit: 2007-01-08 | Discharge: 2007-01-08 | Payer: Self-pay | Admitting: Obstetrics and Gynecology

## 2007-01-08 IMAGING — US UNKNOWN US STUDY
1 series · 4 of 4 positions shown · non-contrast
Comparison: [DATE], [DATE], and [DATE].

[REDACTED] RIGHT
CC and MLO view(s) were taken of the right breast.

RIGHT BREAST ULTRASOUND
DIGITAL LIMITED RIGHT  DIAGNOSTIC MAMMOGRAM WITH CAD AND RIGHT BREAST ULTRASOUND:
CLINICAL DATA: Screening call-back for right breast mass.

[Series 1: unknown us study · 4 of 4 slices shown]
[im 1/4]
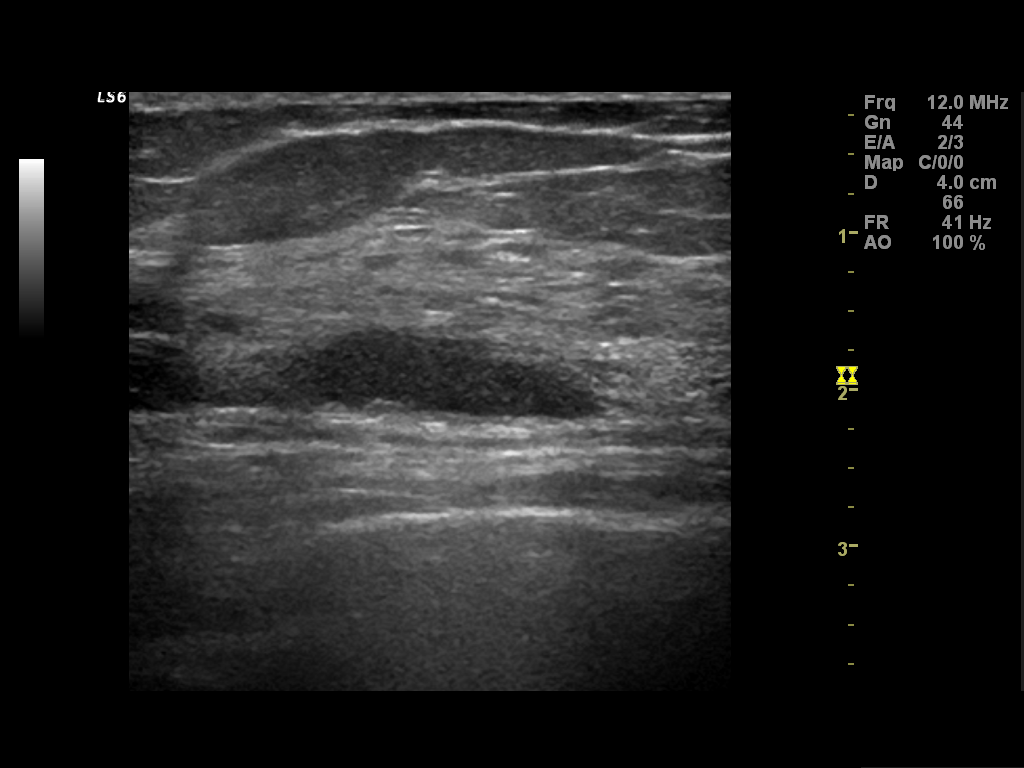
[im 2/4]
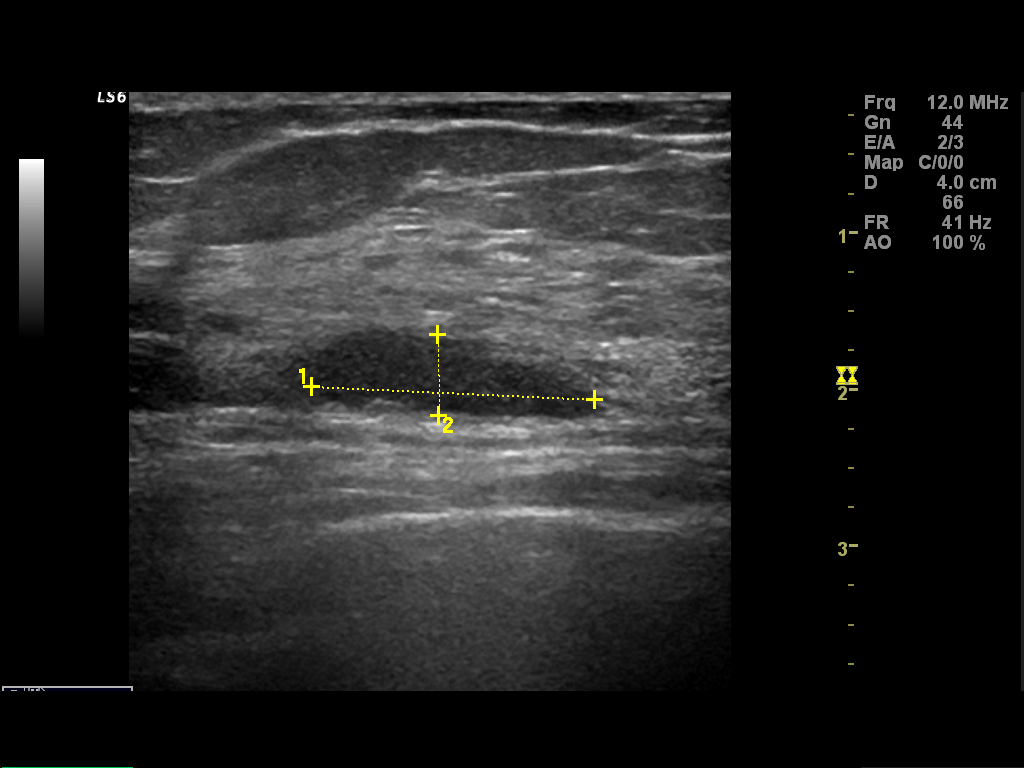
[im 3/4]
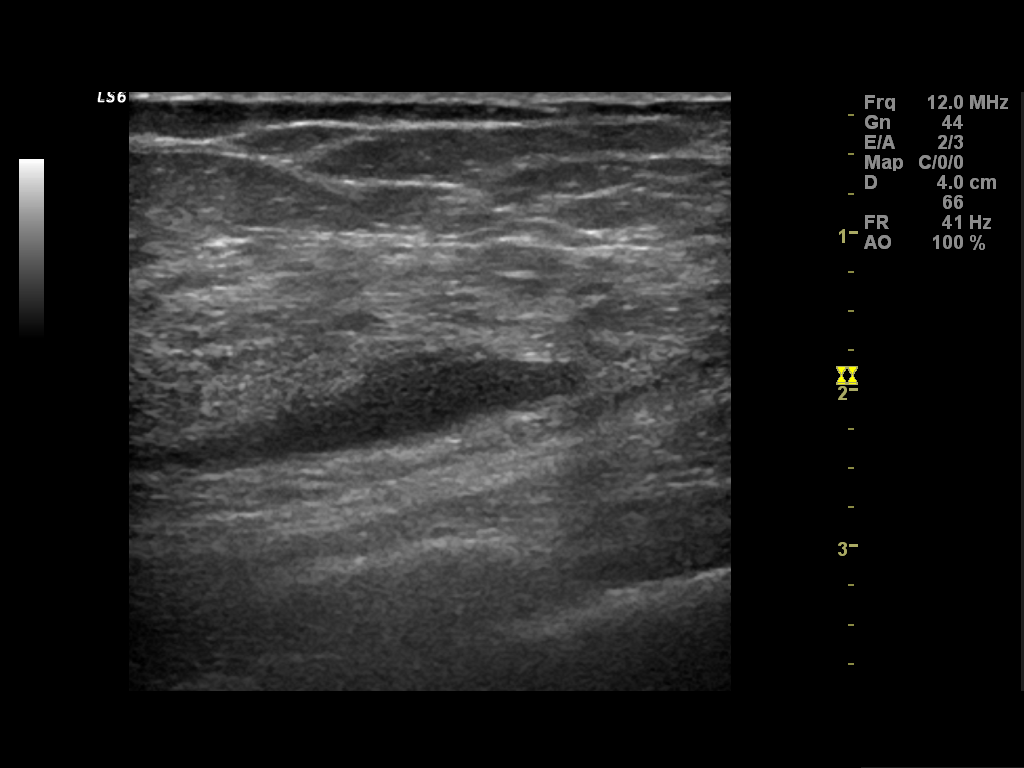
[im 4/4]
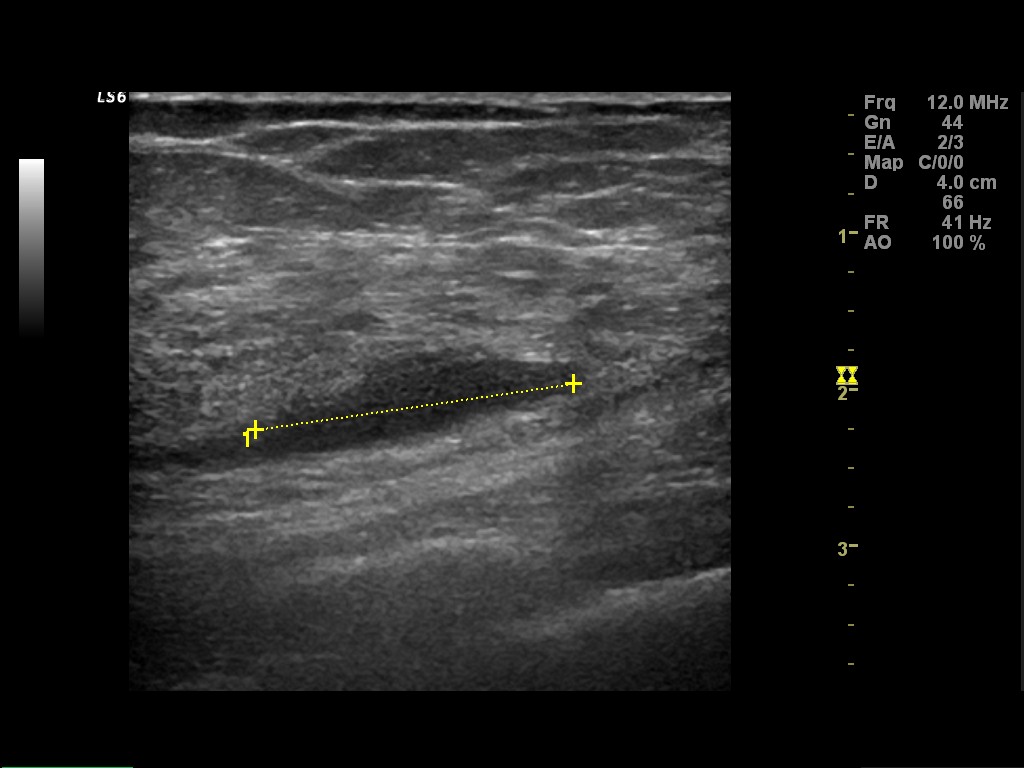

[4 of 4 positions shown; findings below may reference images not displayed]

Additional views confirm the presence of a circumscribed oval mass in the right breast 9 o'clock 
location.

Ultrasound of this area demonstrates a simple-appearing cyst measuring 2.1 x 1.8 x 0.5 cm.
IMPRESSION: No evidence for malignancy; the finding on screening mammography corresponds to a simple-appearing 
cyst in the right breast 9 o'clock location.  Screening mammography recommended in one year.  
Findings and recommendations discussed with the patient and provided in written form at the time of
the study.

ASSESSMENT: Negative - BI-RADS 1

Screening mammogram of both breasts in 1 year.
ANALYZED BY COMPUTER AIDED DETECTION. ,

## 2007-01-08 IMAGING — MG MM DIAGNOSTIC LTD RIGHT
2 series · 2 of 2 positions shown · non-contrast
Comparison: [DATE], [DATE], and [DATE].

[REDACTED] RIGHT
CC and MLO view(s) were taken of the right breast.

RIGHT BREAST ULTRASOUND
DIGITAL LIMITED RIGHT  DIAGNOSTIC MAMMOGRAM WITH CAD AND RIGHT BREAST ULTRASOUND:
CLINICAL DATA: Screening call-back for right breast mass.

[R MLO]
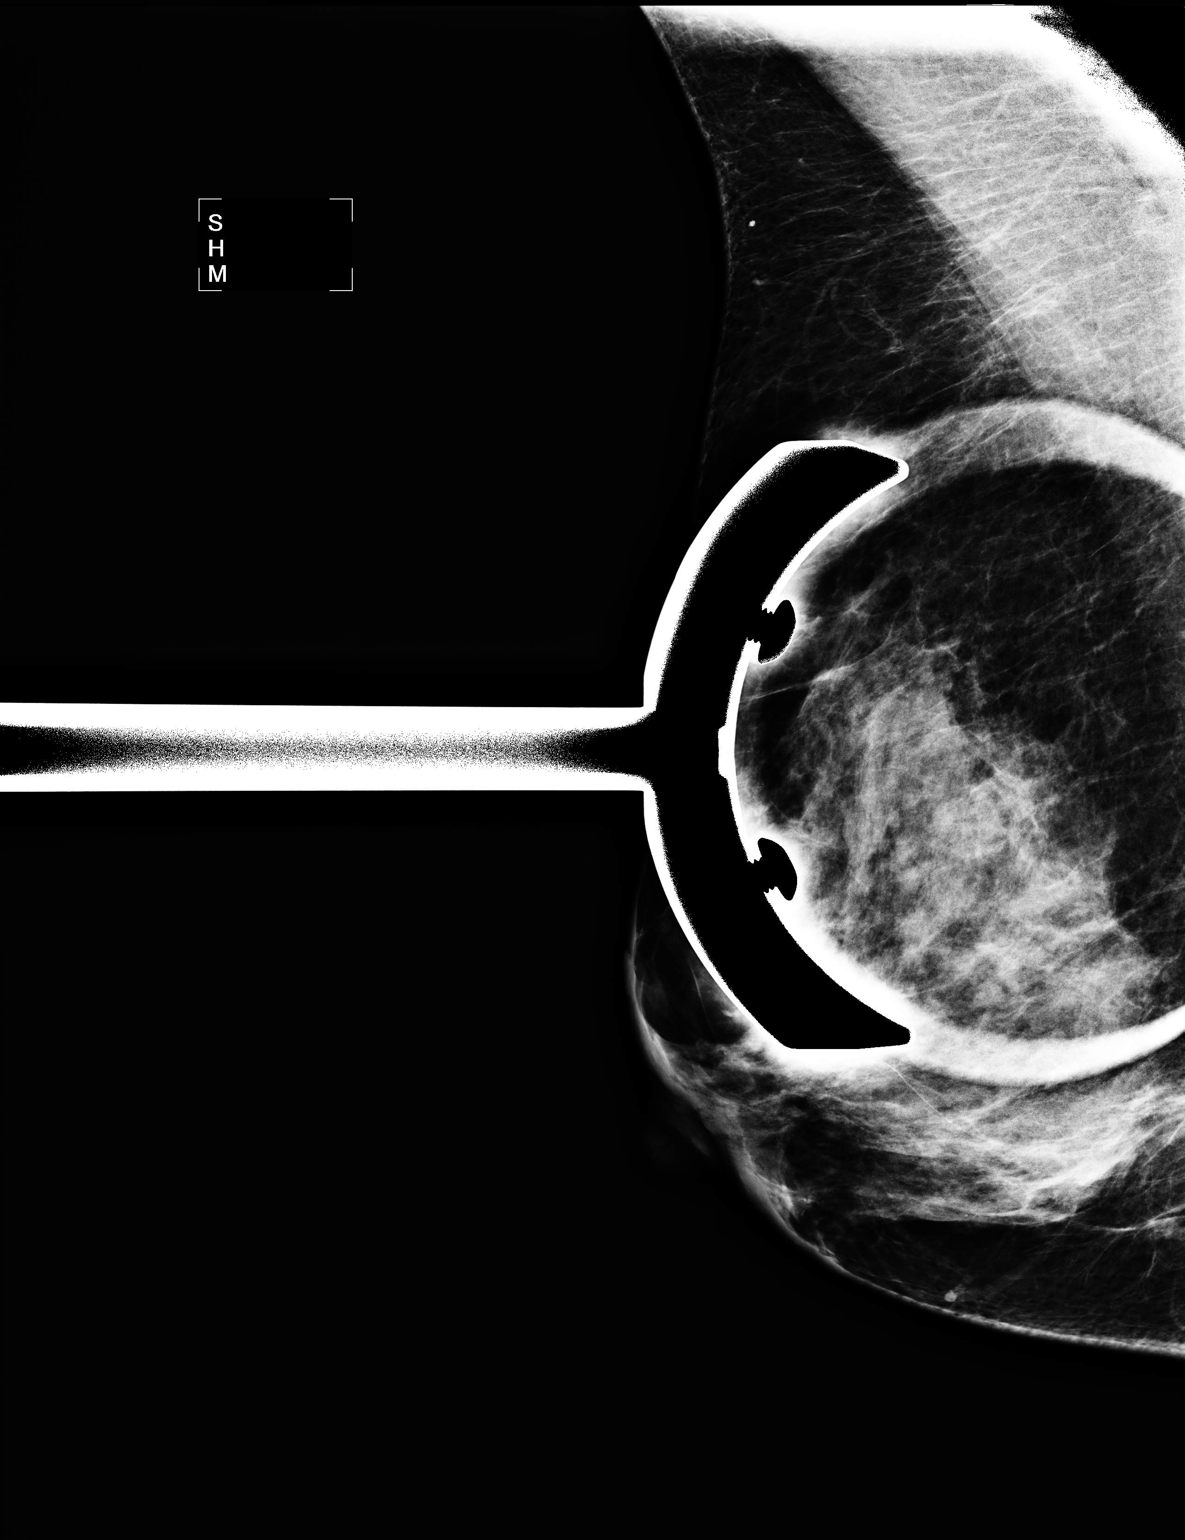

[R XCCL]
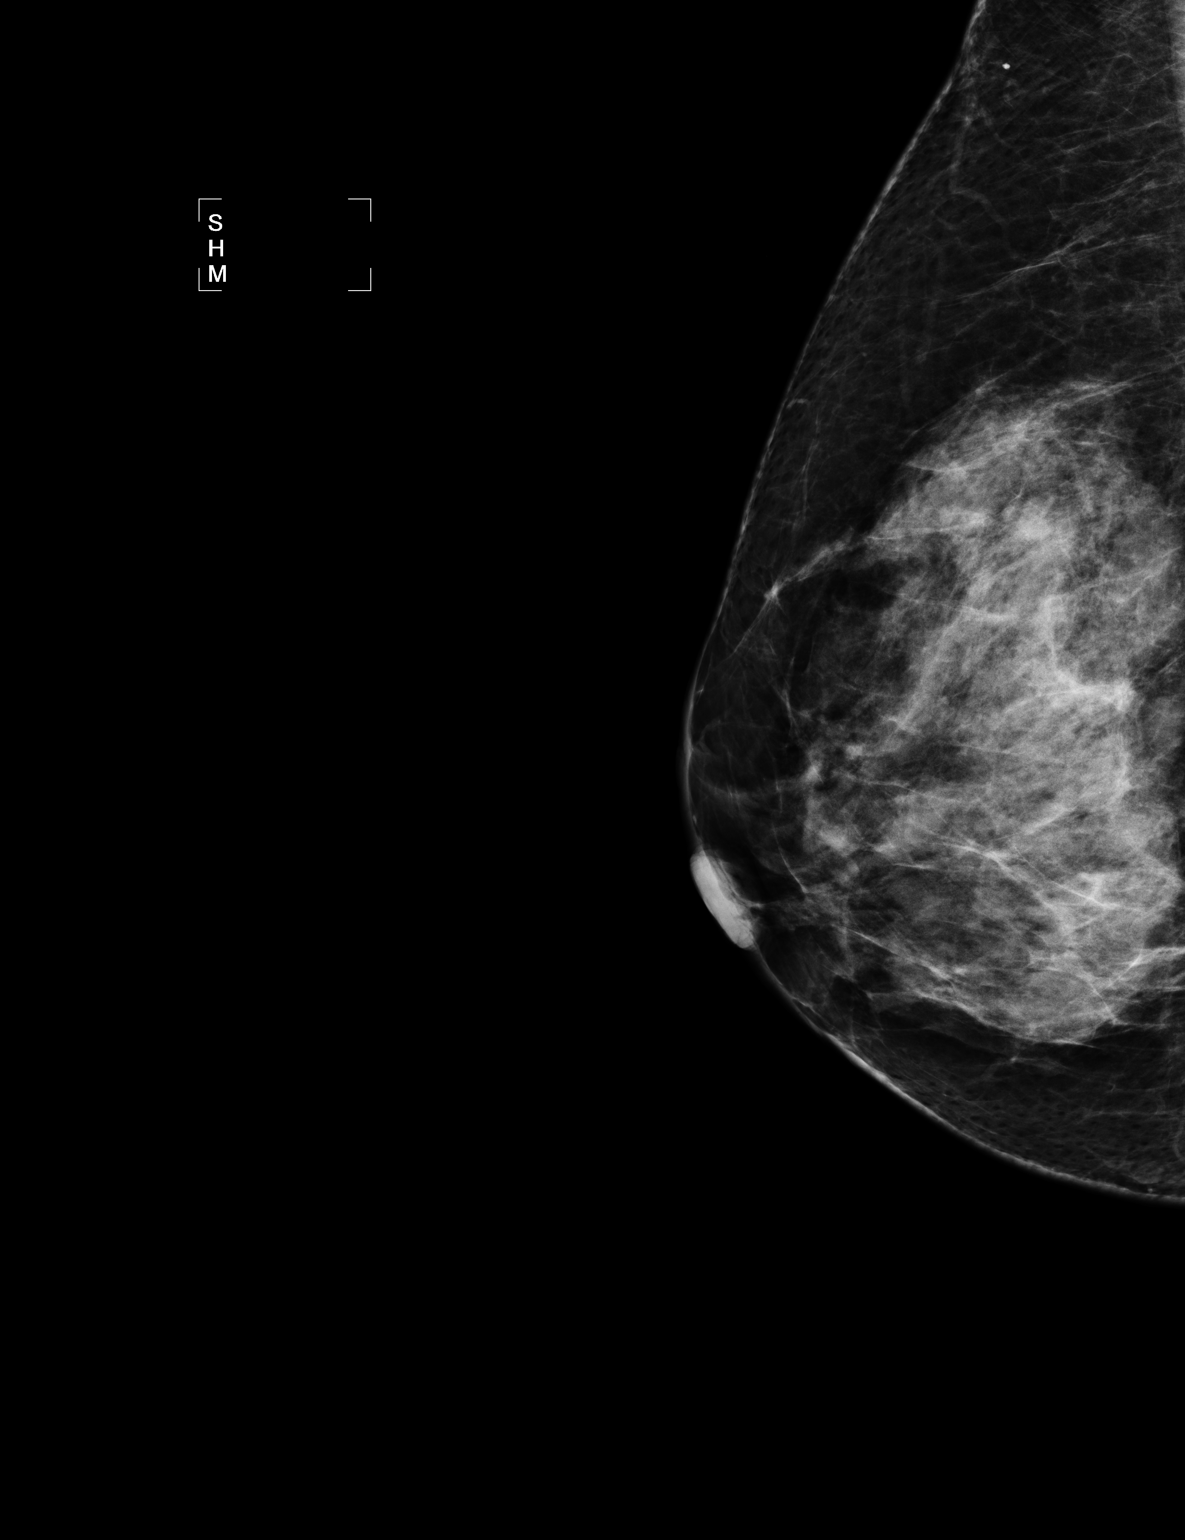

[2 of 2 positions shown; findings below may reference images not displayed]

Additional views confirm the presence of a circumscribed oval mass in the right breast 9 o'clock 
location.

Ultrasound of this area demonstrates a simple-appearing cyst measuring 2.1 x 1.8 x 0.5 cm.
IMPRESSION: No evidence for malignancy; the finding on screening mammography corresponds to a simple-appearing 
cyst in the right breast 9 o'clock location.  Screening mammography recommended in one year.  
Findings and recommendations discussed with the patient and provided in written form at the time of
the study.

ASSESSMENT: Negative - BI-RADS 1

Screening mammogram of both breasts in 1 year.
ANALYZED BY COMPUTER AIDED DETECTION. ,

## 2007-06-25 ENCOUNTER — Ambulatory Visit (HOSPITAL_COMMUNITY): Admission: RE | Admit: 2007-06-25 | Discharge: 2007-06-25 | Payer: Self-pay | Admitting: Internal Medicine

## 2007-06-25 IMAGING — US US SOFT TISSUE HEAD/NECK
1 series · 14 of 25 positions shown · non-contrast
Comparison: Ultrasound of [DATE].

CLINICAL DATA: Enlarged thyroid. 
 THYROID ULTRASOUND:
TECHNIQUE: Ultrasound examination of the thyroid gland and adjacent soft tissue structures was performed.

[Series 1: unknown · 0.07mm/px · 14 of 38 slices shown]
[im 1/38]
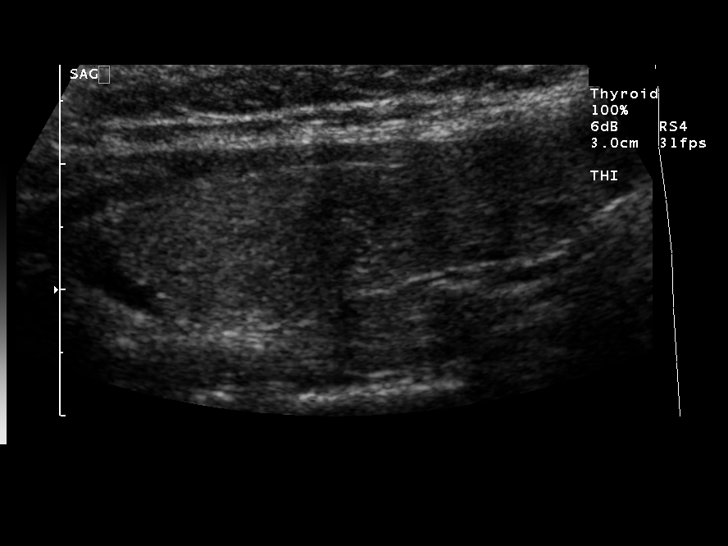
[im 4/38]
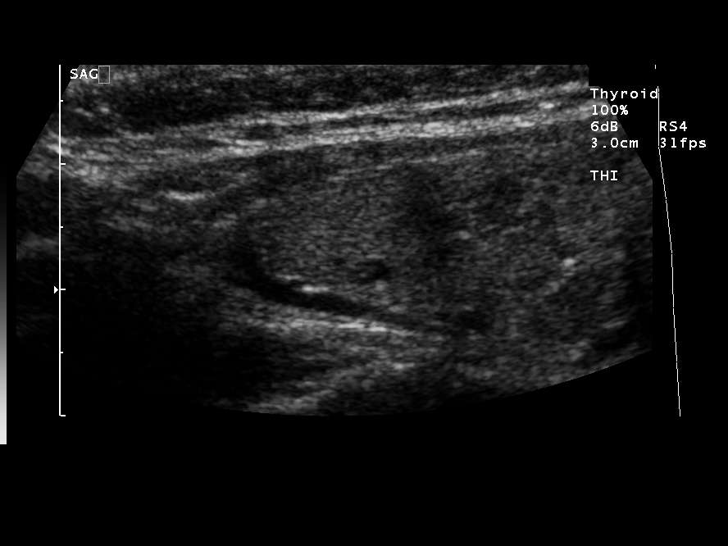
[im 7/38]
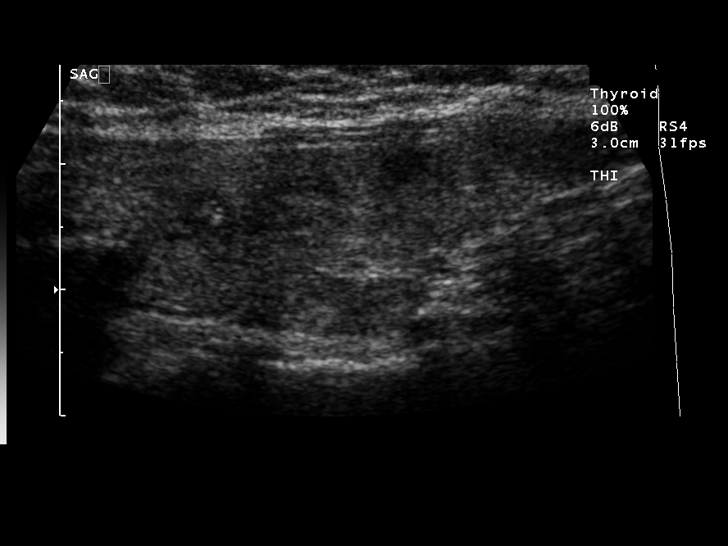
[im 10/38]
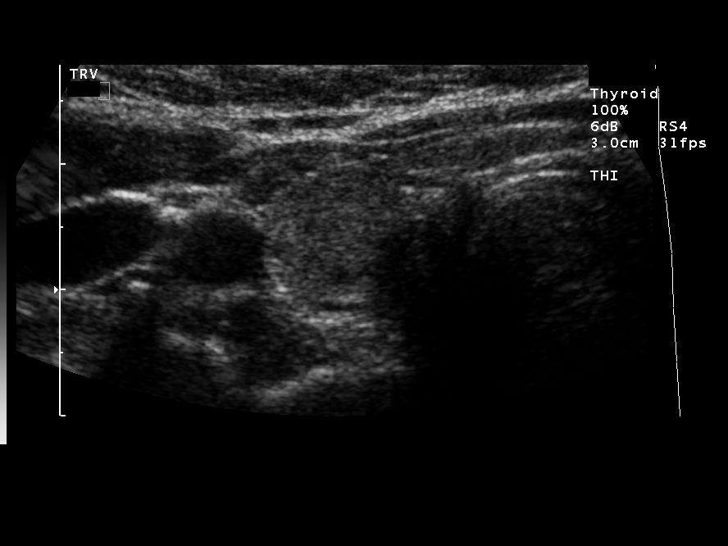
[im 13/38]
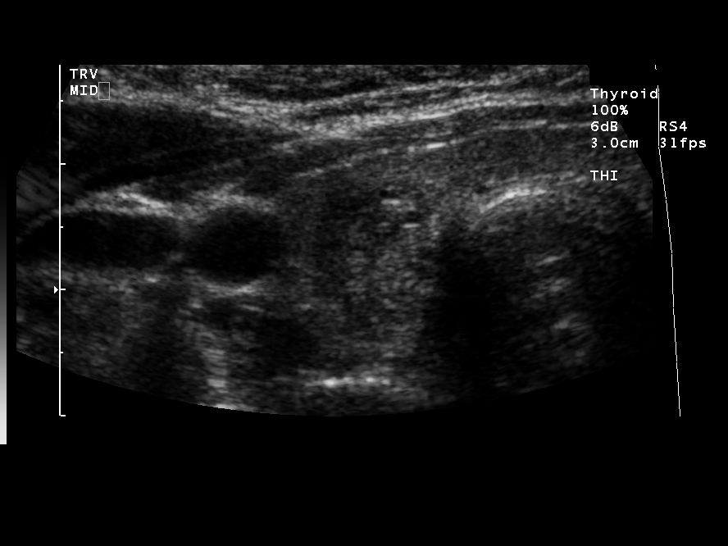
[im 14/38]
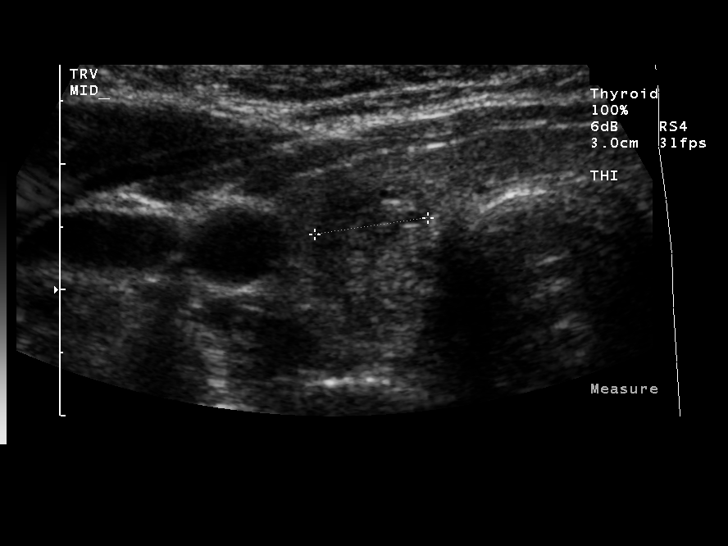
[im 17/38]
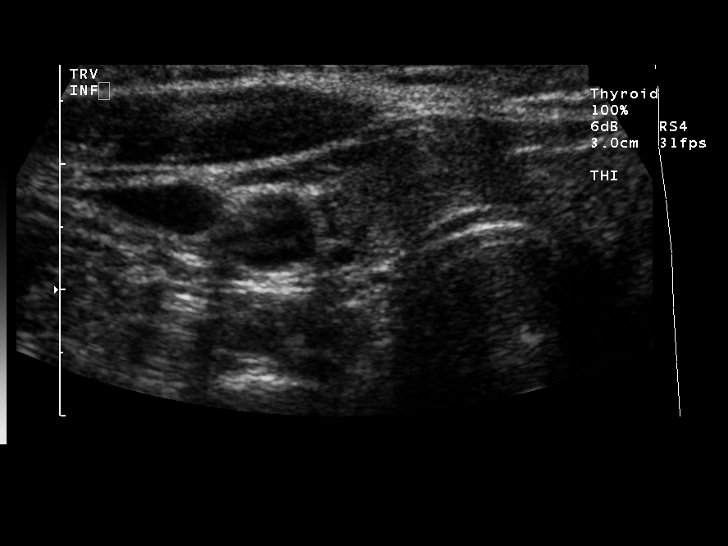
[im 21/38]
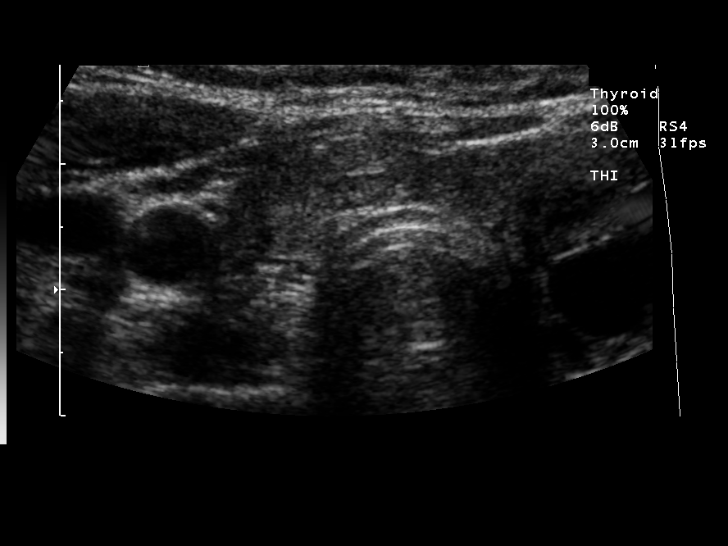
[im 24/38]
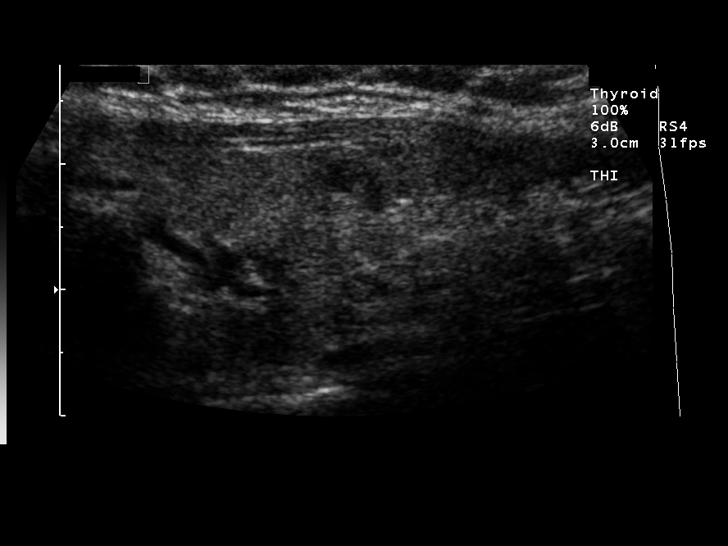
[im 25/38]
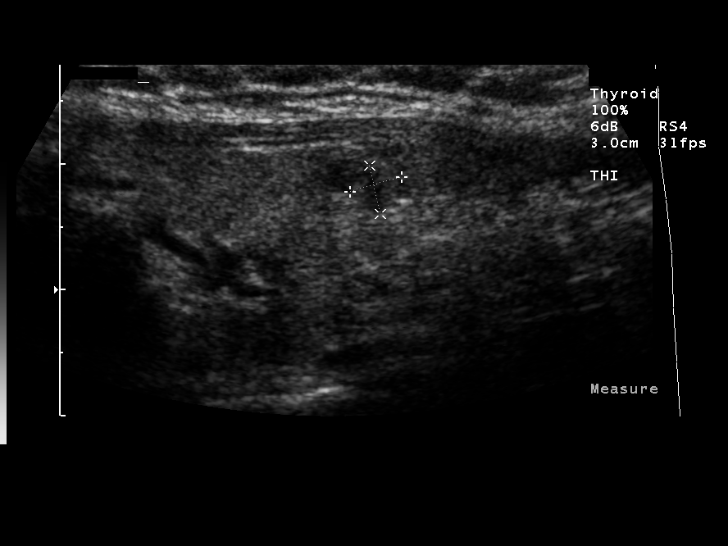
[im 28/38]
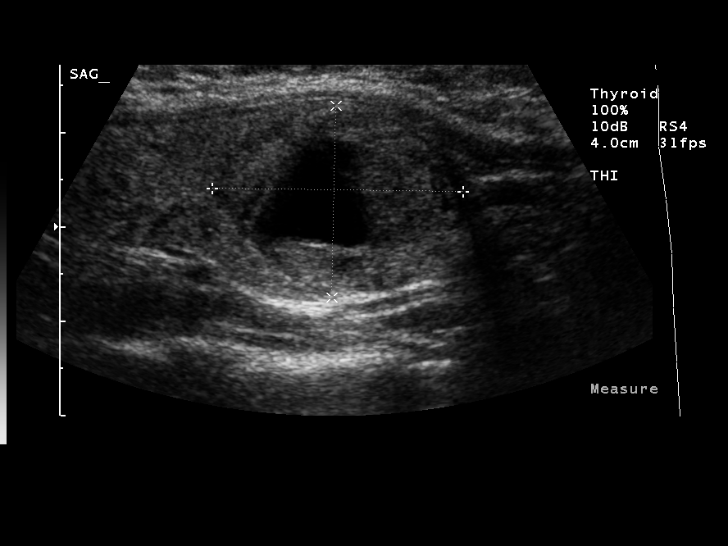
[im 31/38]
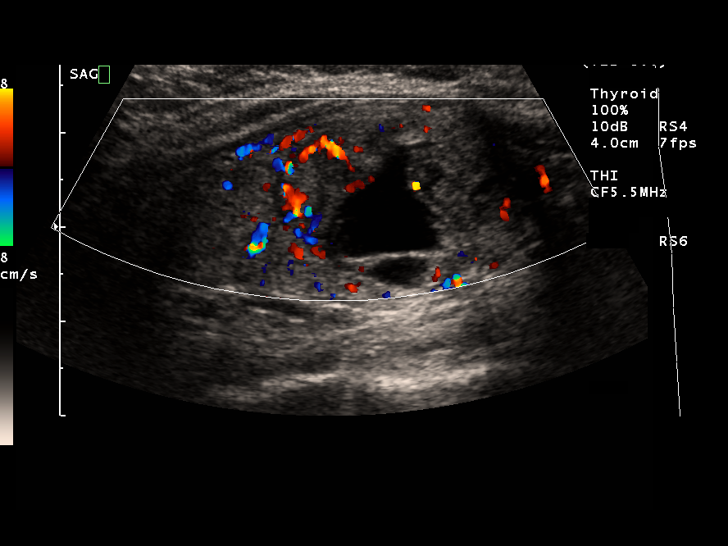
[im 34/38]
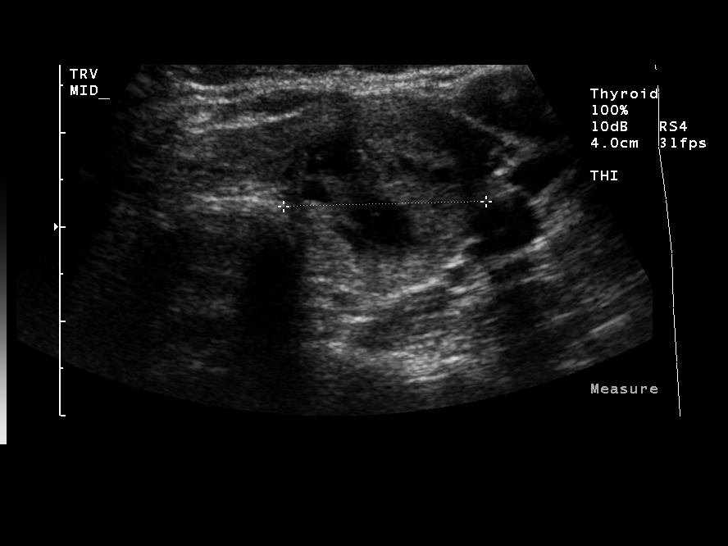
[im 38/38]
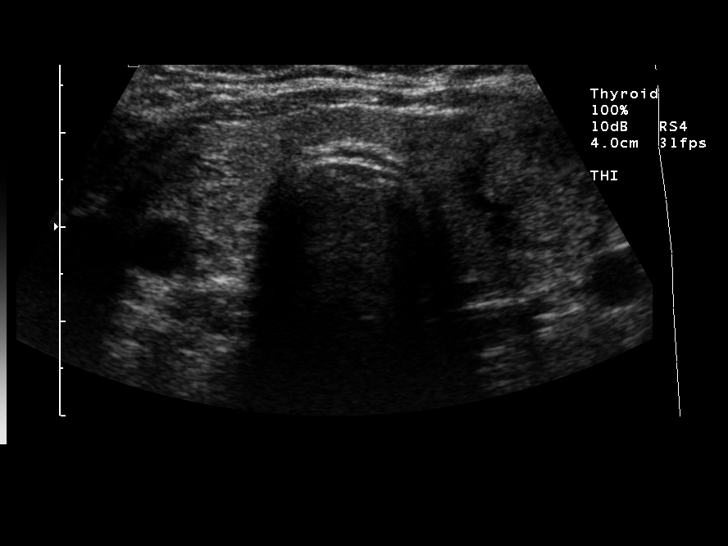

[14 of 25 positions shown; findings below may reference images not displayed]

FINDINGS: The thyroid gland has not changed significantly in size. The right lobe measures 4.5 cm sagittally with a depth of 1.5 cm and width of 1.4 cm.  The left lobe measures 5.2 x 2.2 x 2.1 cm with the isthmus measuring 4 mm.  The gland is inhomogeneous and there are nodules bilaterally.  The largest nodule in the left lower pole has increased slightly in size measuring 2.7 x 2.0 x 2.1 cm compared to a prior measurement of 2.3 x 1.8 x 1.8 cm.  Smaller nodules are noted bilaterally.
IMPRESSION: Slight increase in size of complex nodule in left lower pole now measuring 2.7 x 2.0 x 2.1 cm.  No other significant change.

## 2008-01-06 ENCOUNTER — Ambulatory Visit (HOSPITAL_COMMUNITY): Admission: RE | Admit: 2008-01-06 | Discharge: 2008-01-06 | Payer: Self-pay | Admitting: Internal Medicine

## 2008-01-06 IMAGING — US US SOFT TISSUE HEAD/NECK
1 series · 14 of 25 positions shown · non-contrast
Comparison: Prior studies dated [DATE] and [DATE].

CLINICAL DATA: Goiter with left thyroid nodule.  On Synthroid.  Follow-up.
THYROID ULTRASOUND ? [DATE]:
TECHNIQUE: Ultrasound examination of the thyroid gland and adjacent soft tissue structures was performed.

[Series 1: unknown · 0.09mm/px · 14 of 36 slices shown]
[im 1/36]
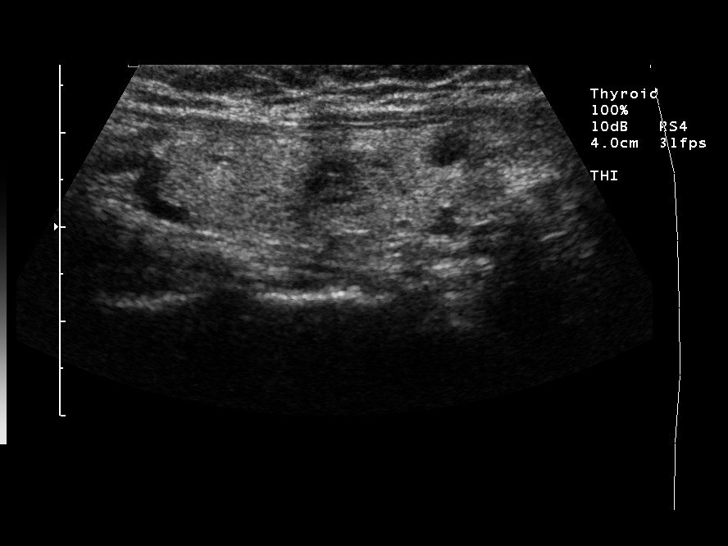
[im 3/36]
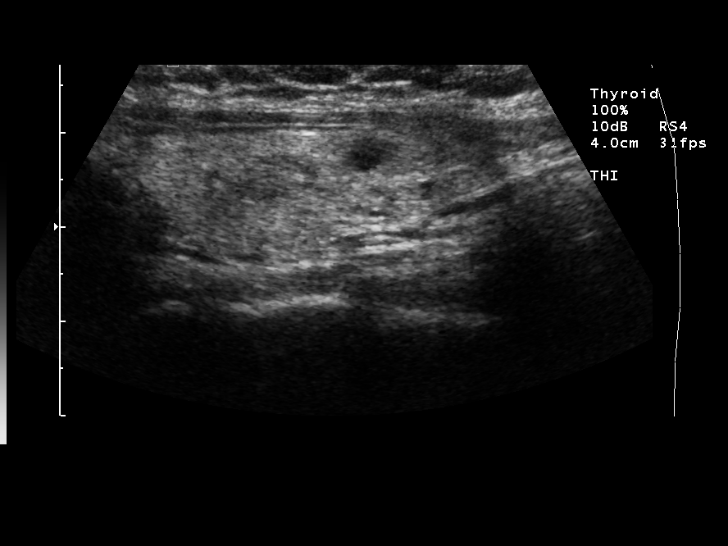
[im 6/36]
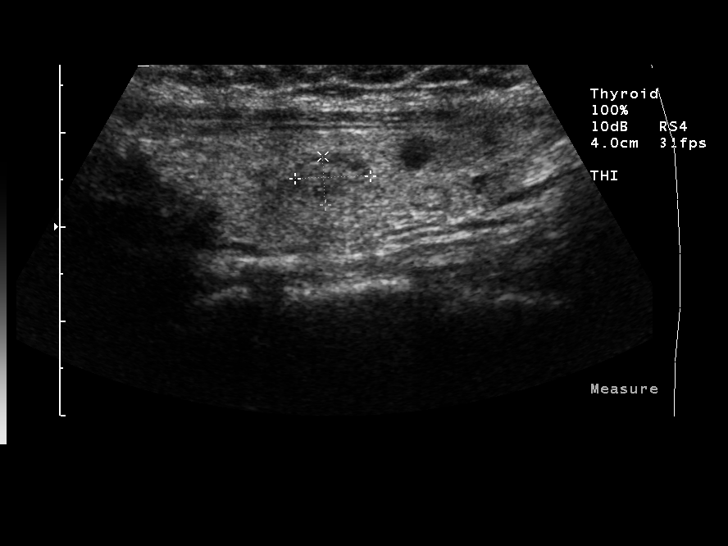
[im 9/36]
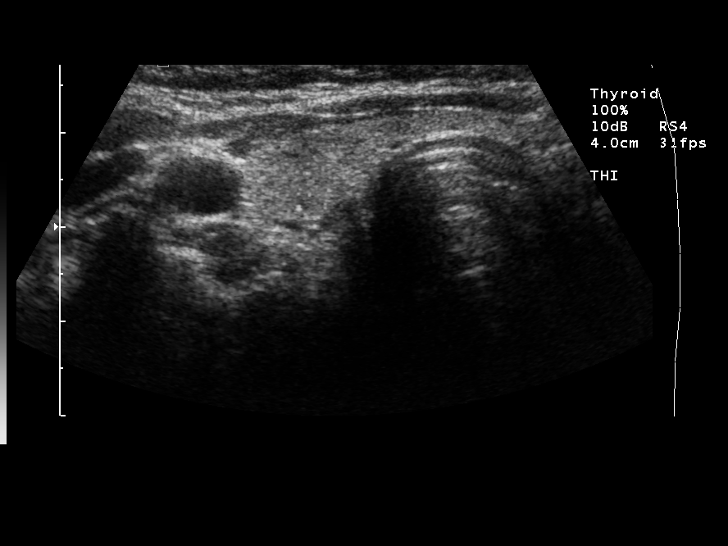
[im 12/36]
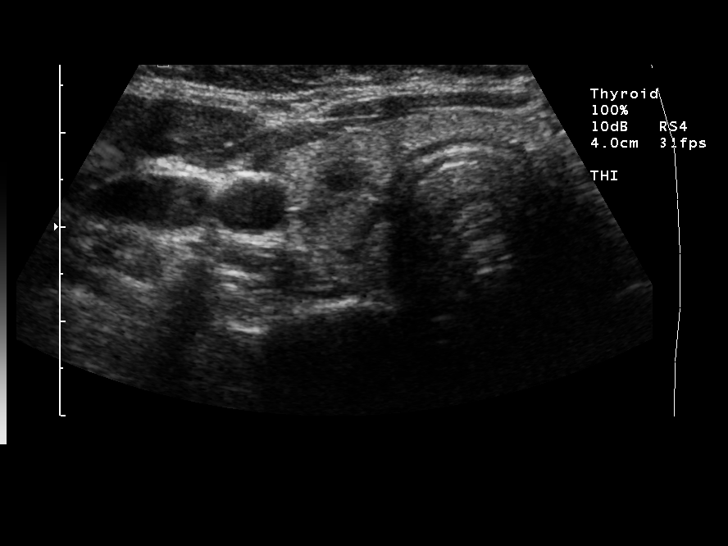
[im 14/36]
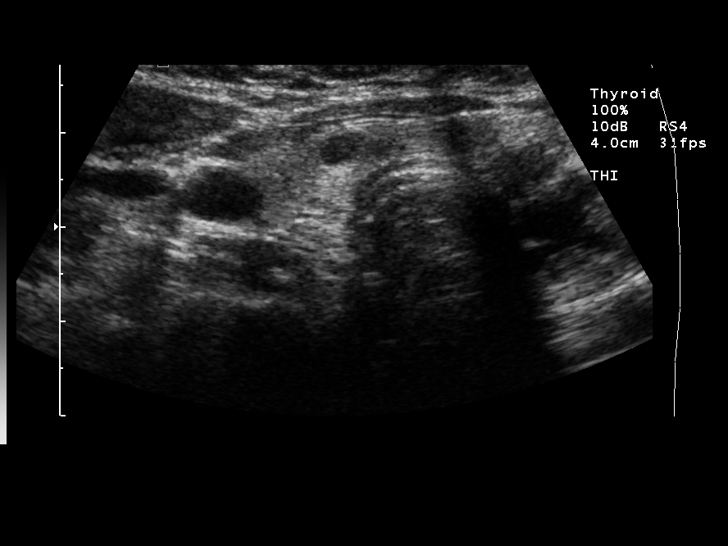
[im 17/36]
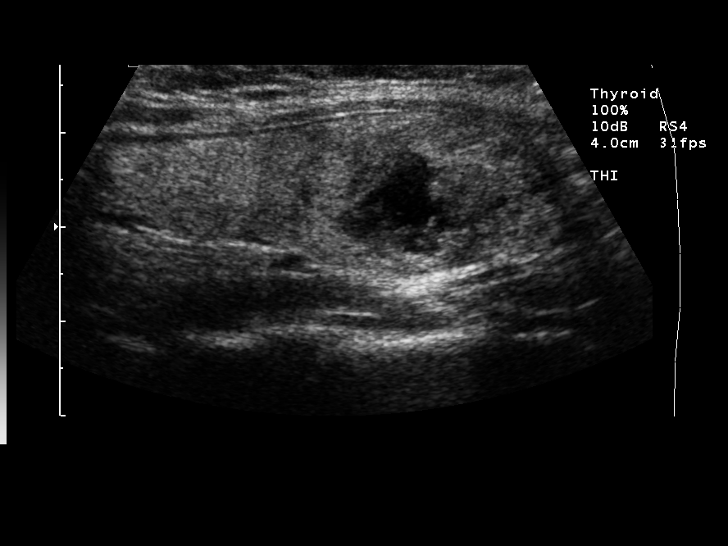
[im 19/36]
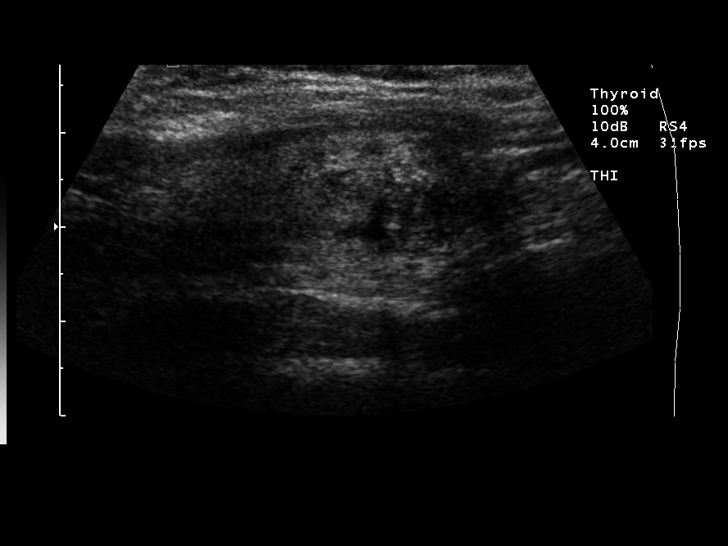
[im 22/36]
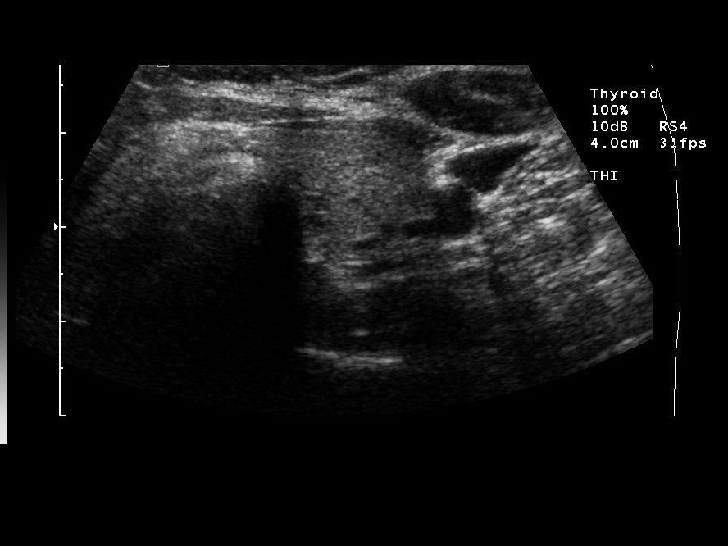
[im 24/36]
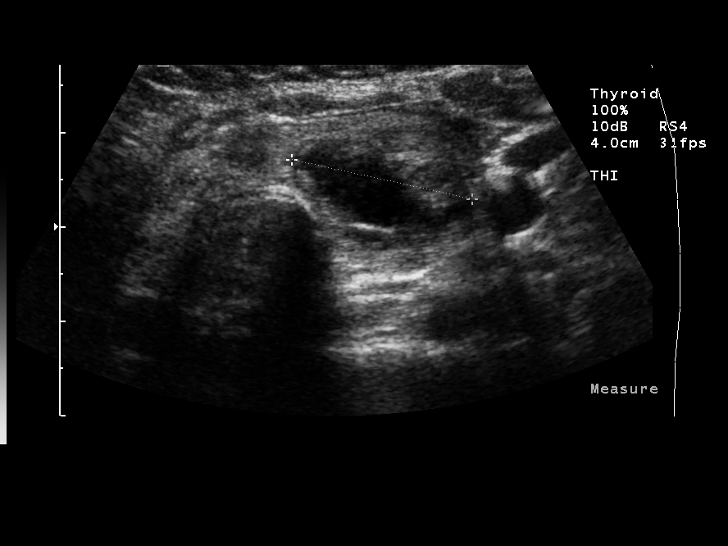
[im 27/36]
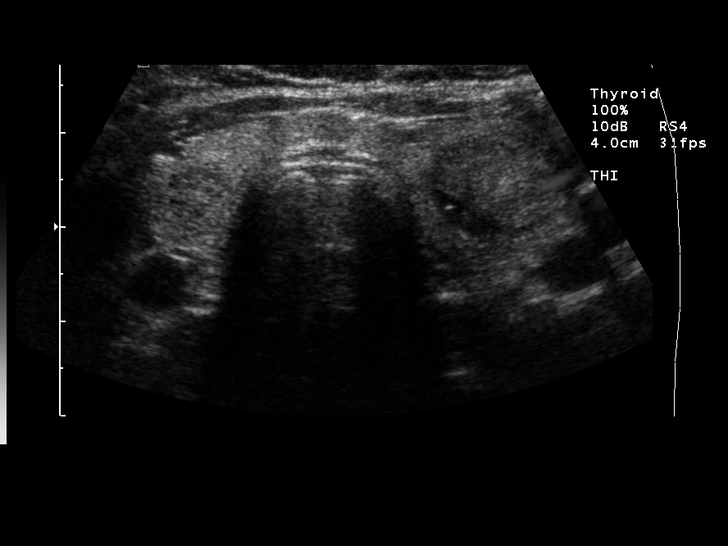
[im 30/36]
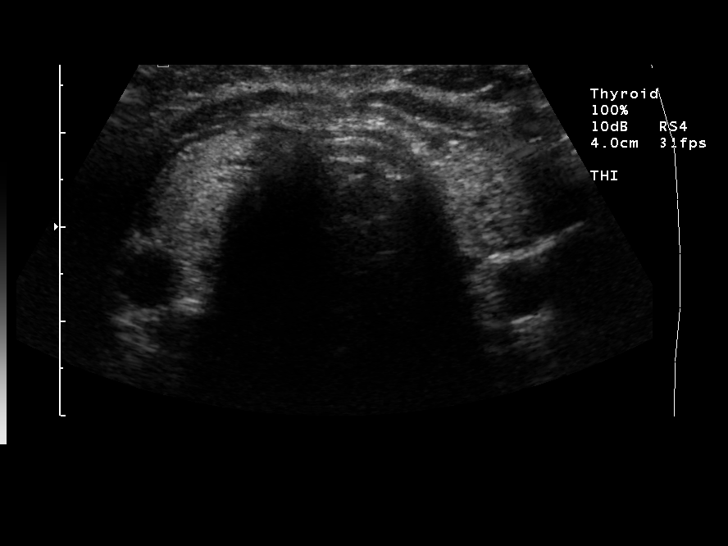
[im 33/36]
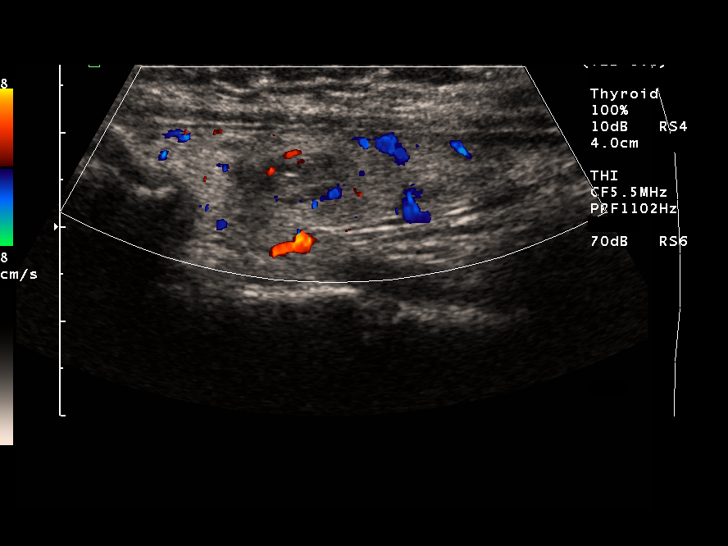
[im 36/36]
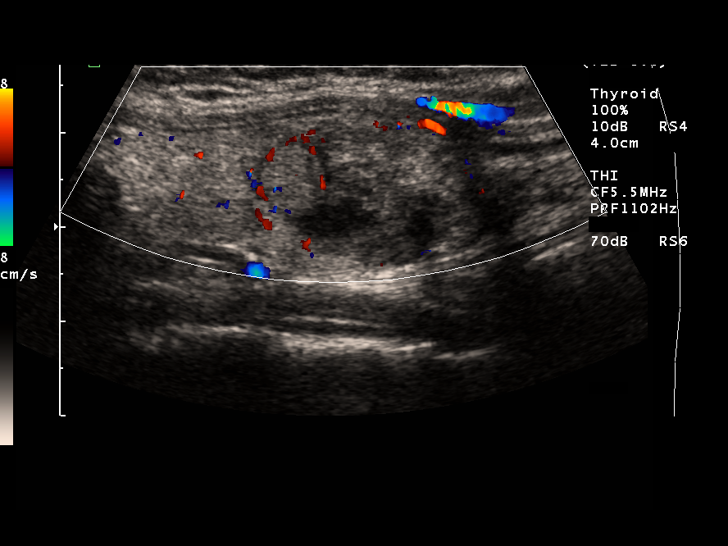

[14 of 25 positions shown; findings below may reference images not displayed]

FINDINGS: Diffuse heterogeneity of the thyroid echotexture is again noted with multiple nodules bilaterally. The overall dimensions of the right lobe are 4.5 x 1.2 x 1.2 cm.  The left lobe measures 5.2 x 1.8 x 2.2 cm.  The dominant nodule inferiorly in the left lobe measures 2.8 x 1.7 x 2.0 cm compared with 2.7 x 2.0 x 2.1 cm on the most recent examination.  Compared with the prior studies, this nodule appears less cystic currently.  It does demonstrate areas of central blood flow with color Doppler.  The additional nodules do not appear significantly changed.
IMPRESSION: 2.  Otherwise stable examination with smaller nodules bilaterally.

## 2008-02-27 ENCOUNTER — Encounter: Admission: RE | Admit: 2008-02-27 | Discharge: 2008-02-27 | Payer: Self-pay | Admitting: Obstetrics and Gynecology

## 2008-02-27 IMAGING — MG MM SCREEN MAMMOGRAM BILATERAL
4 series · 4 of 4 positions shown · non-contrast
Comparison: none

DG SCREEN MAMMOGRAM BILATERAL
Bilateral CC and MLO view(s) were taken.

DIGITAL SCREENING MAMMOGRAM WITH CAD:
The breast tissue is heterogeneously dense.  No masses or malignant type calcifications are 
identified.  Compared with prior studies.

[R CC]
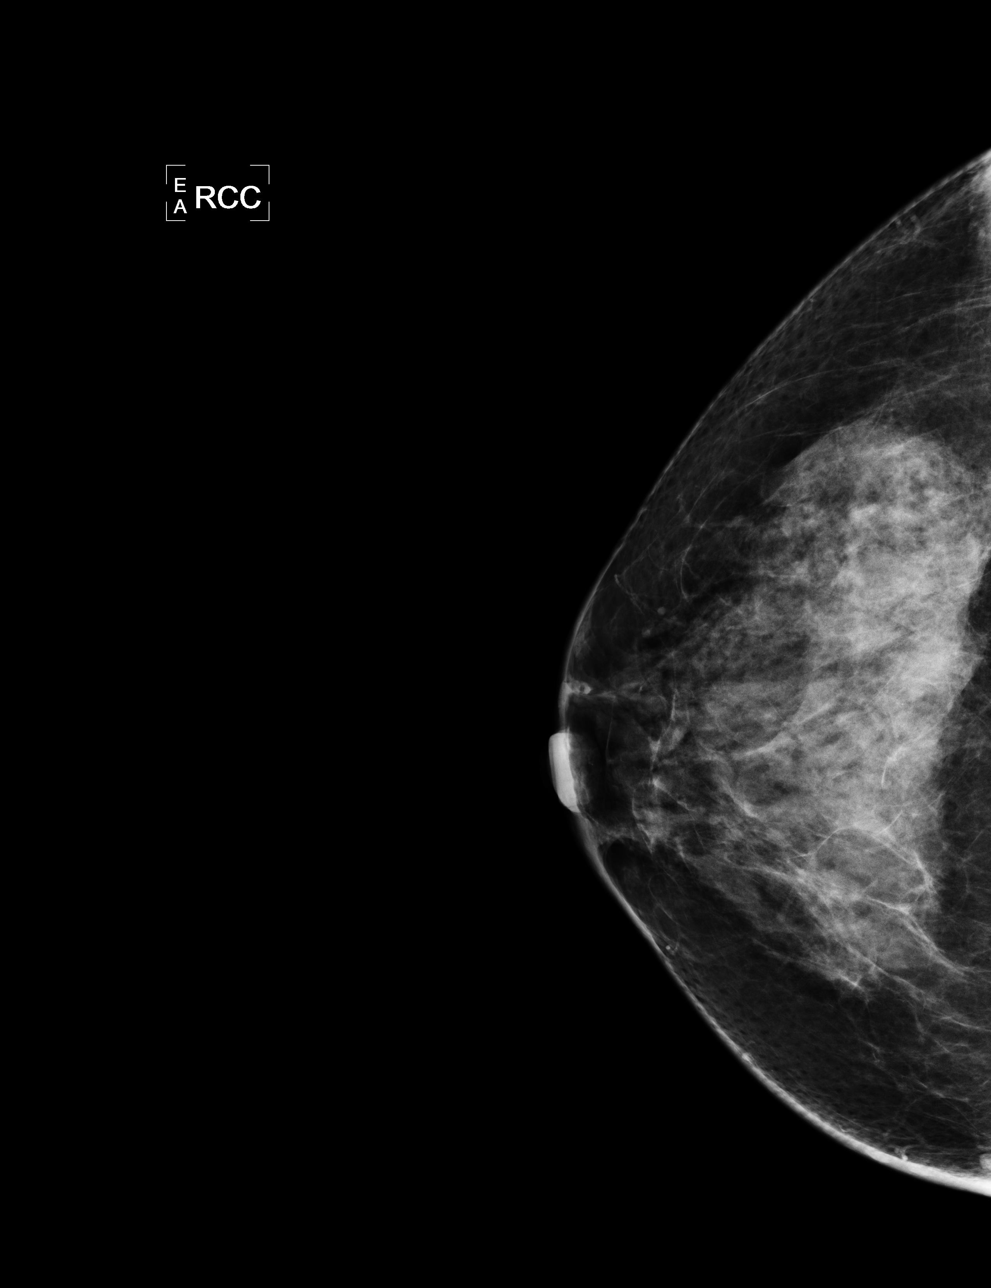

[L CC]
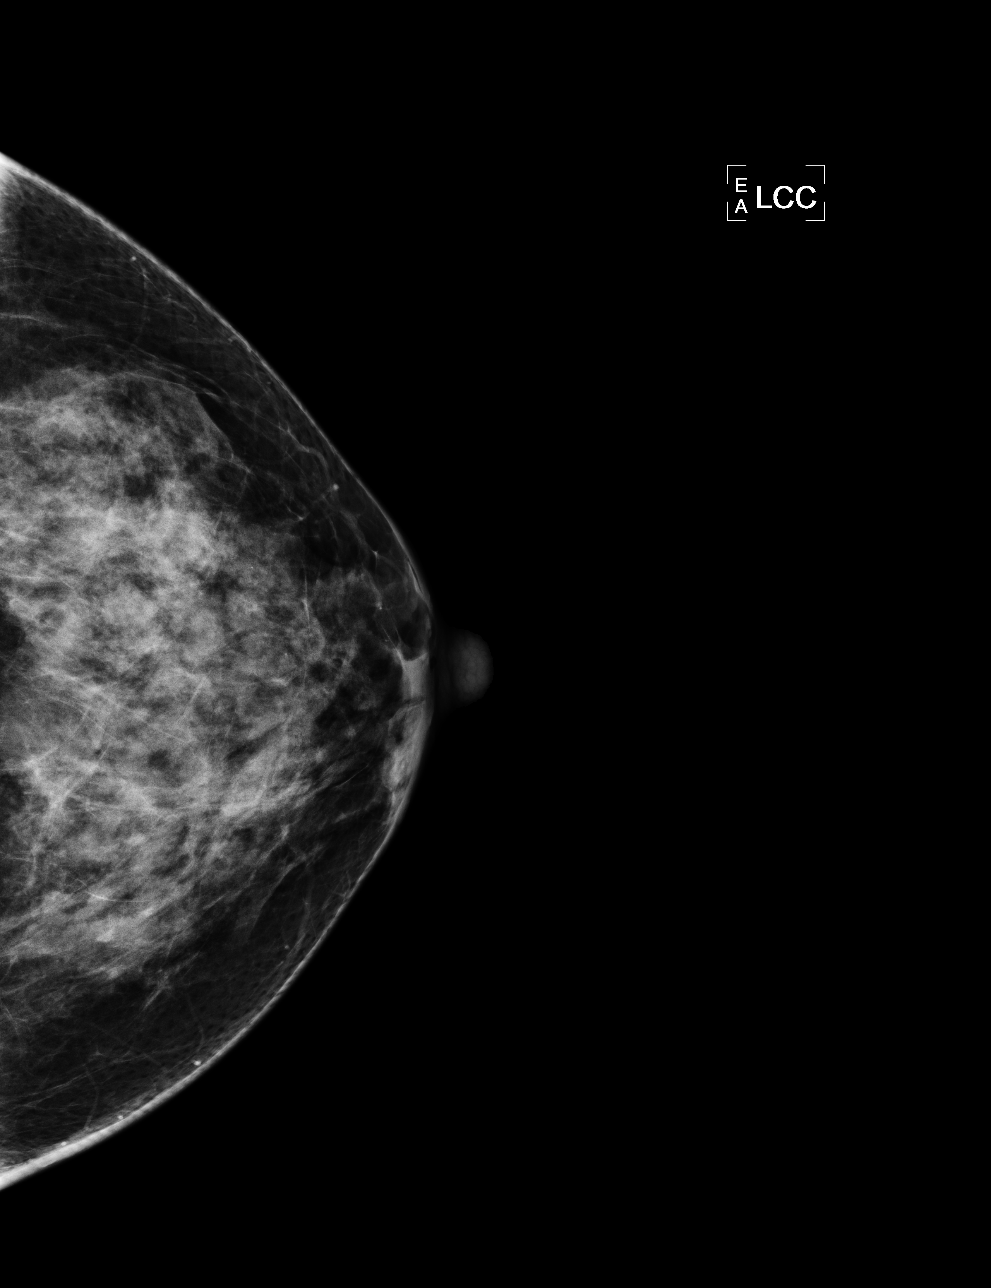

[L MLO]
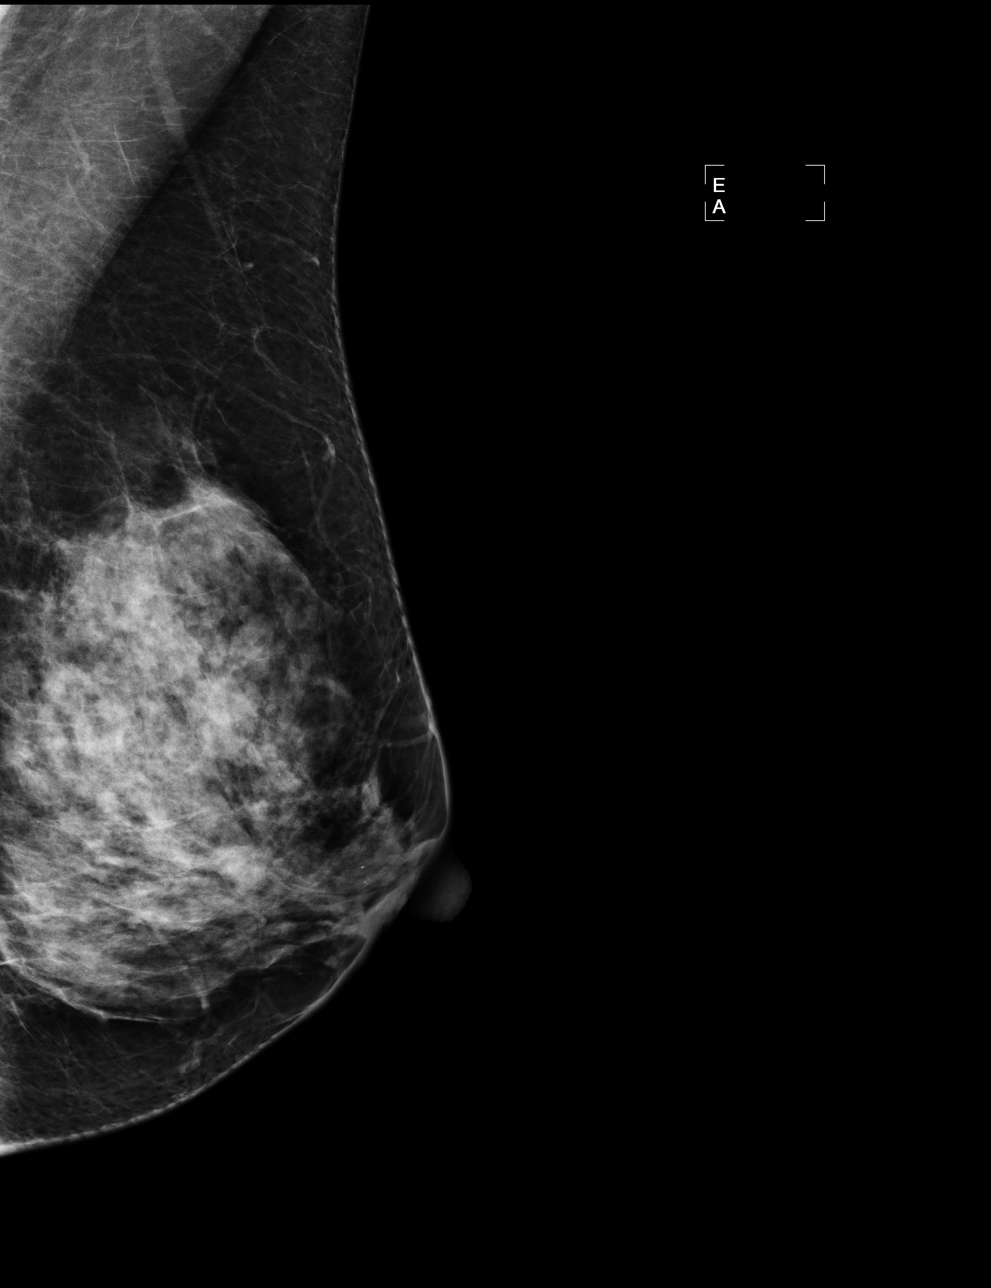

[R MLO]
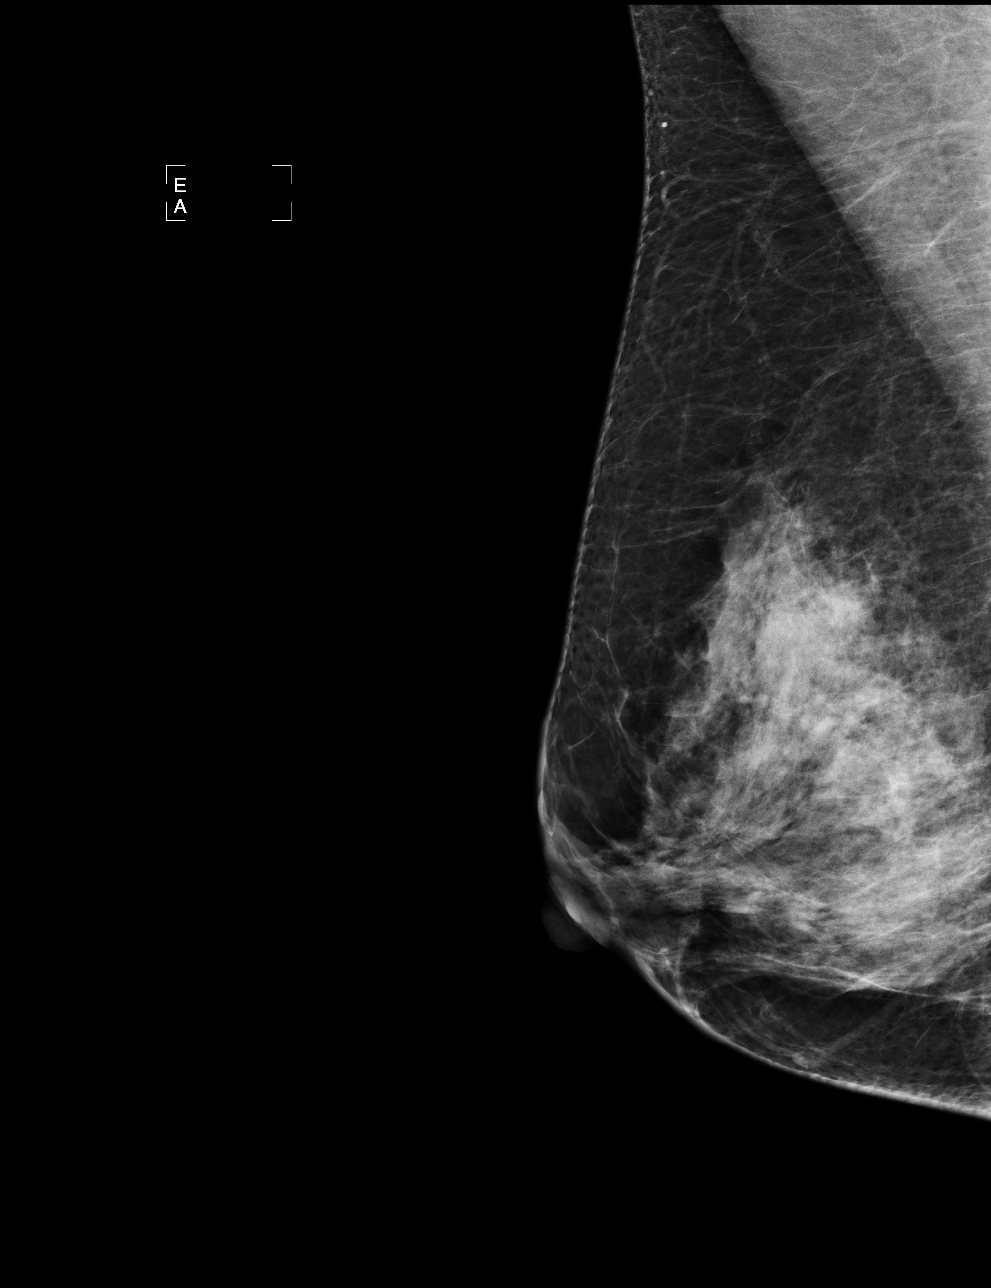

[4 of 4 positions shown; findings below may reference images not displayed]

IMPRESSION: No specific mammographic evidence of malignancy.  Next screening mammogram is recommended in one 
year.

ASSESSMENT: Negative - BI-RADS 1

Screening mammogram in 1 year.
ANALYZED BY COMPUTER AIDED DETECTION. , THIS PROCEDURE WAS A DIGITAL MAMMOGRAM.

## 2008-07-08 ENCOUNTER — Other Ambulatory Visit: Admission: RE | Admit: 2008-07-08 | Discharge: 2008-07-08 | Payer: Self-pay | Admitting: Obstetrics and Gynecology

## 2008-07-08 ENCOUNTER — Ambulatory Visit: Payer: Self-pay | Admitting: Women's Health

## 2008-07-08 ENCOUNTER — Encounter: Payer: Self-pay | Admitting: Women's Health

## 2008-11-30 ENCOUNTER — Ambulatory Visit: Payer: Self-pay | Admitting: Women's Health

## 2009-03-26 ENCOUNTER — Encounter: Admission: RE | Admit: 2009-03-26 | Discharge: 2009-03-26 | Payer: Self-pay | Admitting: Gynecology

## 2009-03-26 IMAGING — MG MM SCREEN MAMMOGRAM BILATERAL
4 series · 4 of 4 positions shown · non-contrast
Comparison: none

DG SCREEN MAMMOGRAM BILATERAL
Bilateral CC and MLO view(s) were taken.

DIGITAL SCREENING MAMMOGRAM WITH CAD:
There are scattered fibroglandular densities.  A possible mass is noted in the right breast.  Spot 
compression views and possibly sonography are recommended for further evaluation.  In the left 
breast, no masses or malignant type calcifications are identified.  Compared with prior studies.

[R CC]
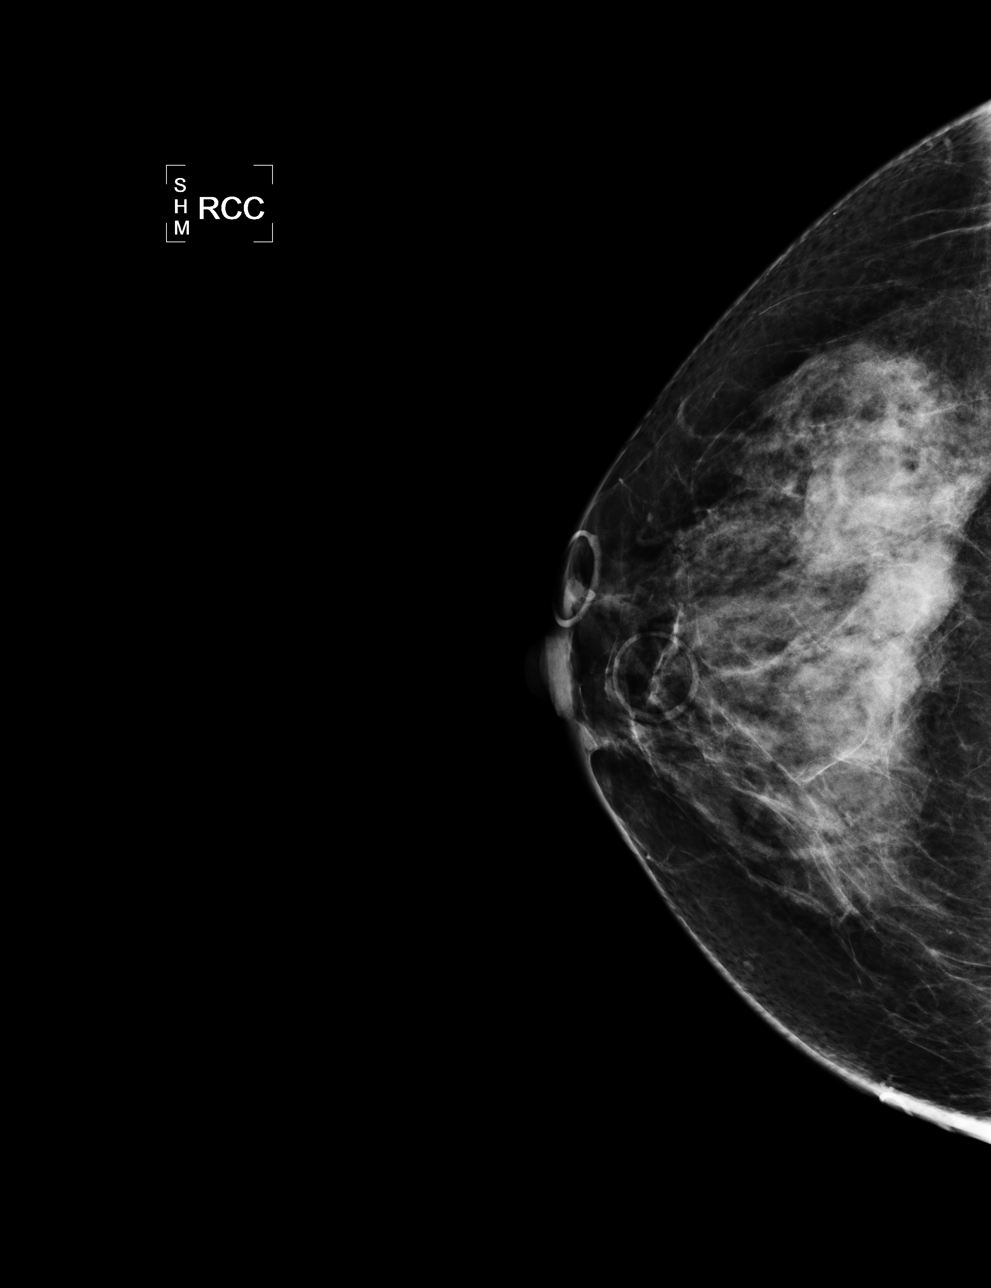

[L CC]
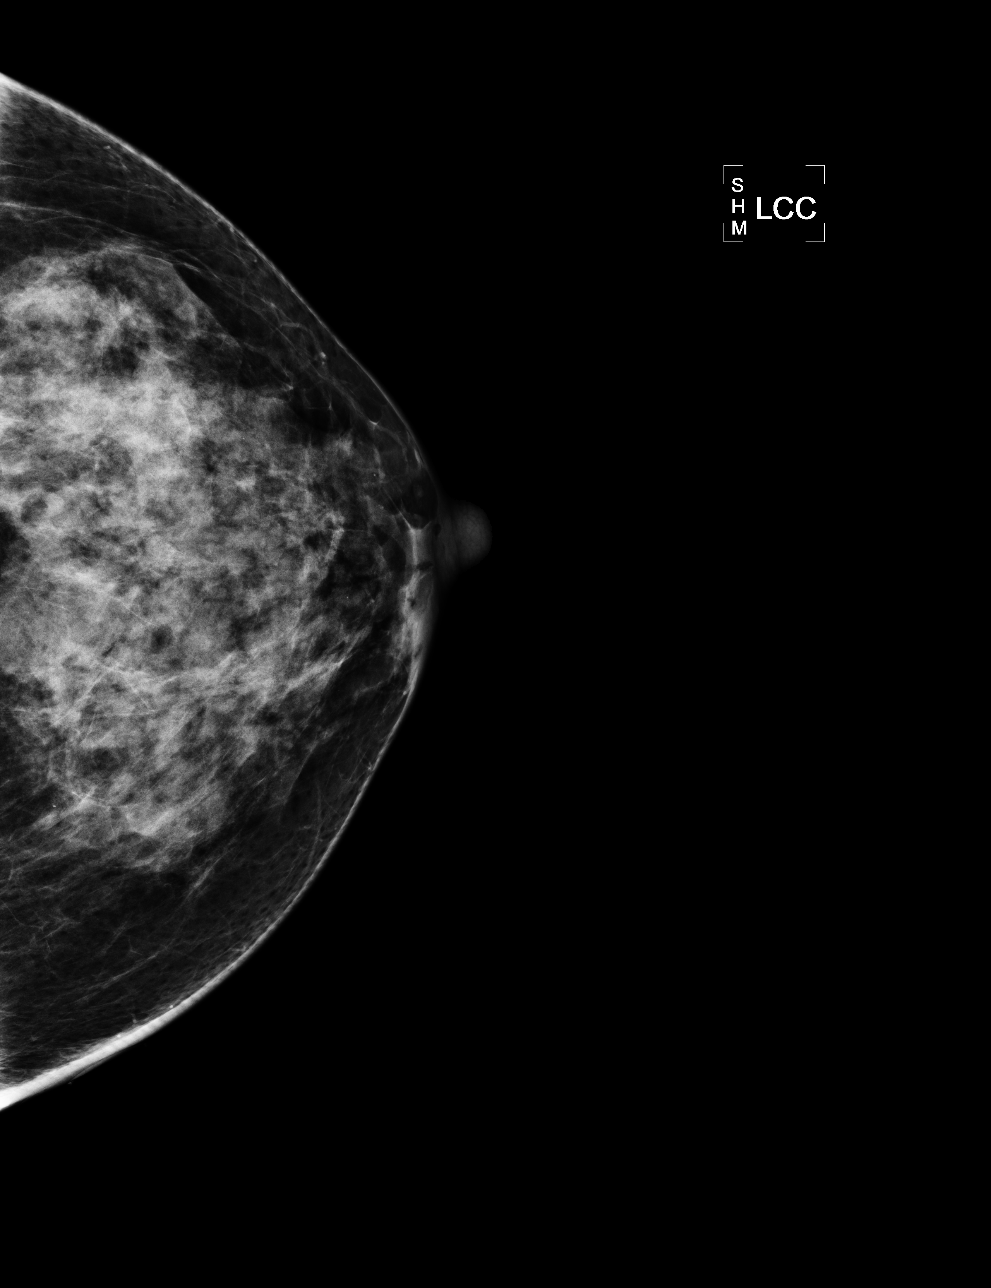

[L MLO]
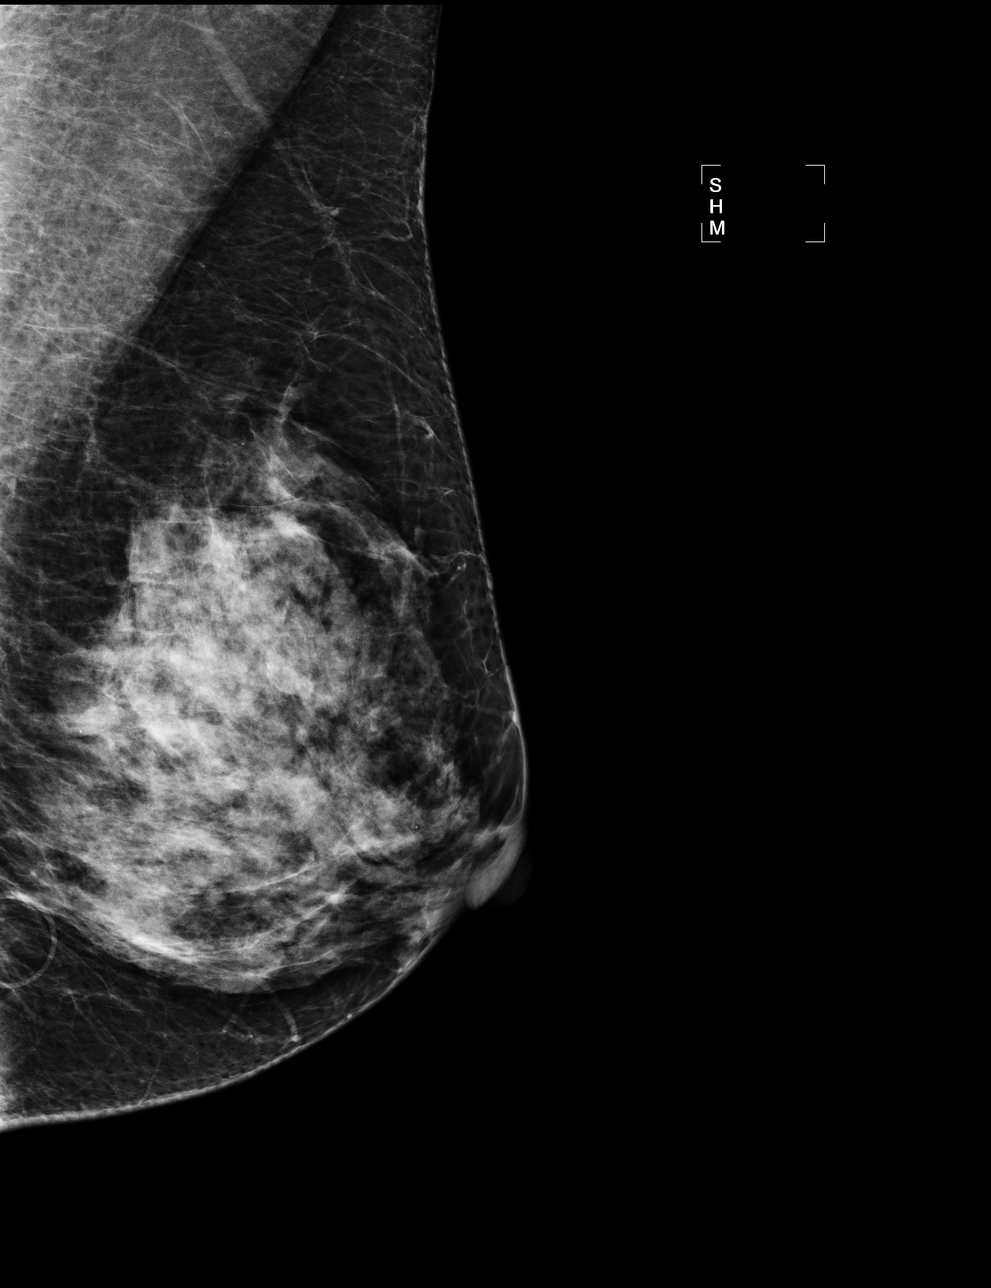

[R MLO]
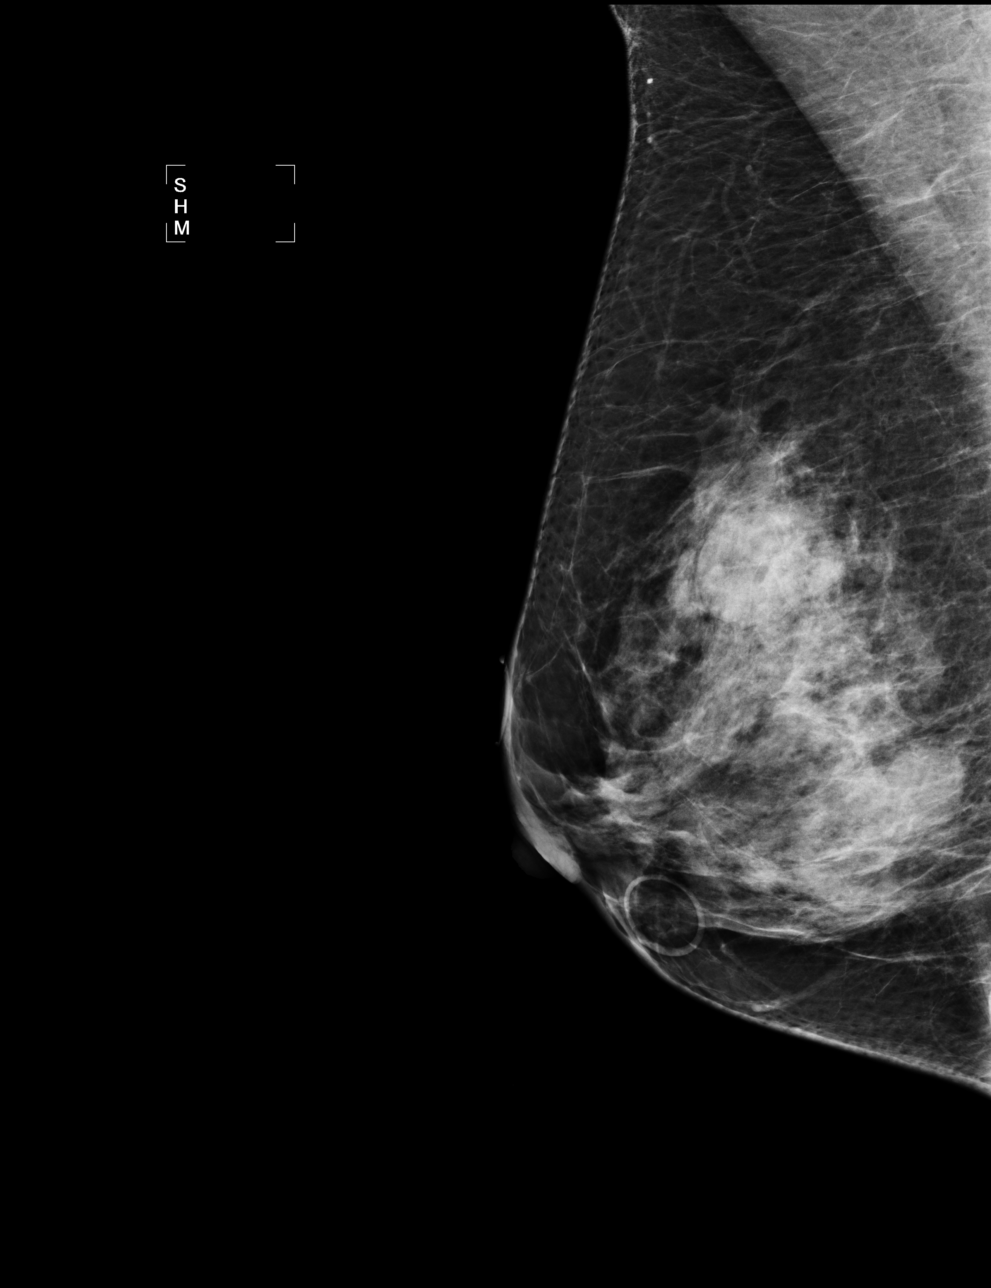

[4 of 4 positions shown; findings below may reference images not displayed]

IMPRESSION: Possible mass, right breast.  Additional evaluation is indicated.  The patient will be contacted 
for additional studies and a supplementary report will follow.  No specific mammographic evidence 
of malignancy, left breast.

ASSESSMENT: Need additional imaging evaluation and/or prior mammograms for comparison - BI-RADS 0

Further imaging of the right breast.
ANALYZED BY COMPUTER AIDED DETECTION. , THIS PROCEDURE WAS A DIGITAL MAMMOGRAM.

## 2009-04-09 ENCOUNTER — Encounter: Admission: RE | Admit: 2009-04-09 | Discharge: 2009-04-09 | Payer: Self-pay | Admitting: Gynecology

## 2009-04-09 IMAGING — MG MM DIAGNOSTIC LTD RIGHT
2 series · 2 of 2 positions shown · non-contrast
Comparison: [DATE] and [DATE]

CLINICAL DATA: Patient presents for additional views of the right
breast as a follow up to recent screening mammogram from [DATE]
demonstrating possible mass in the upper outer right breast.

DIGITAL DIAGNOSTIC  RIGHT  MAMMOGRAM   AND RIGHT BREAST
ULTRASOUND:

[R CC]
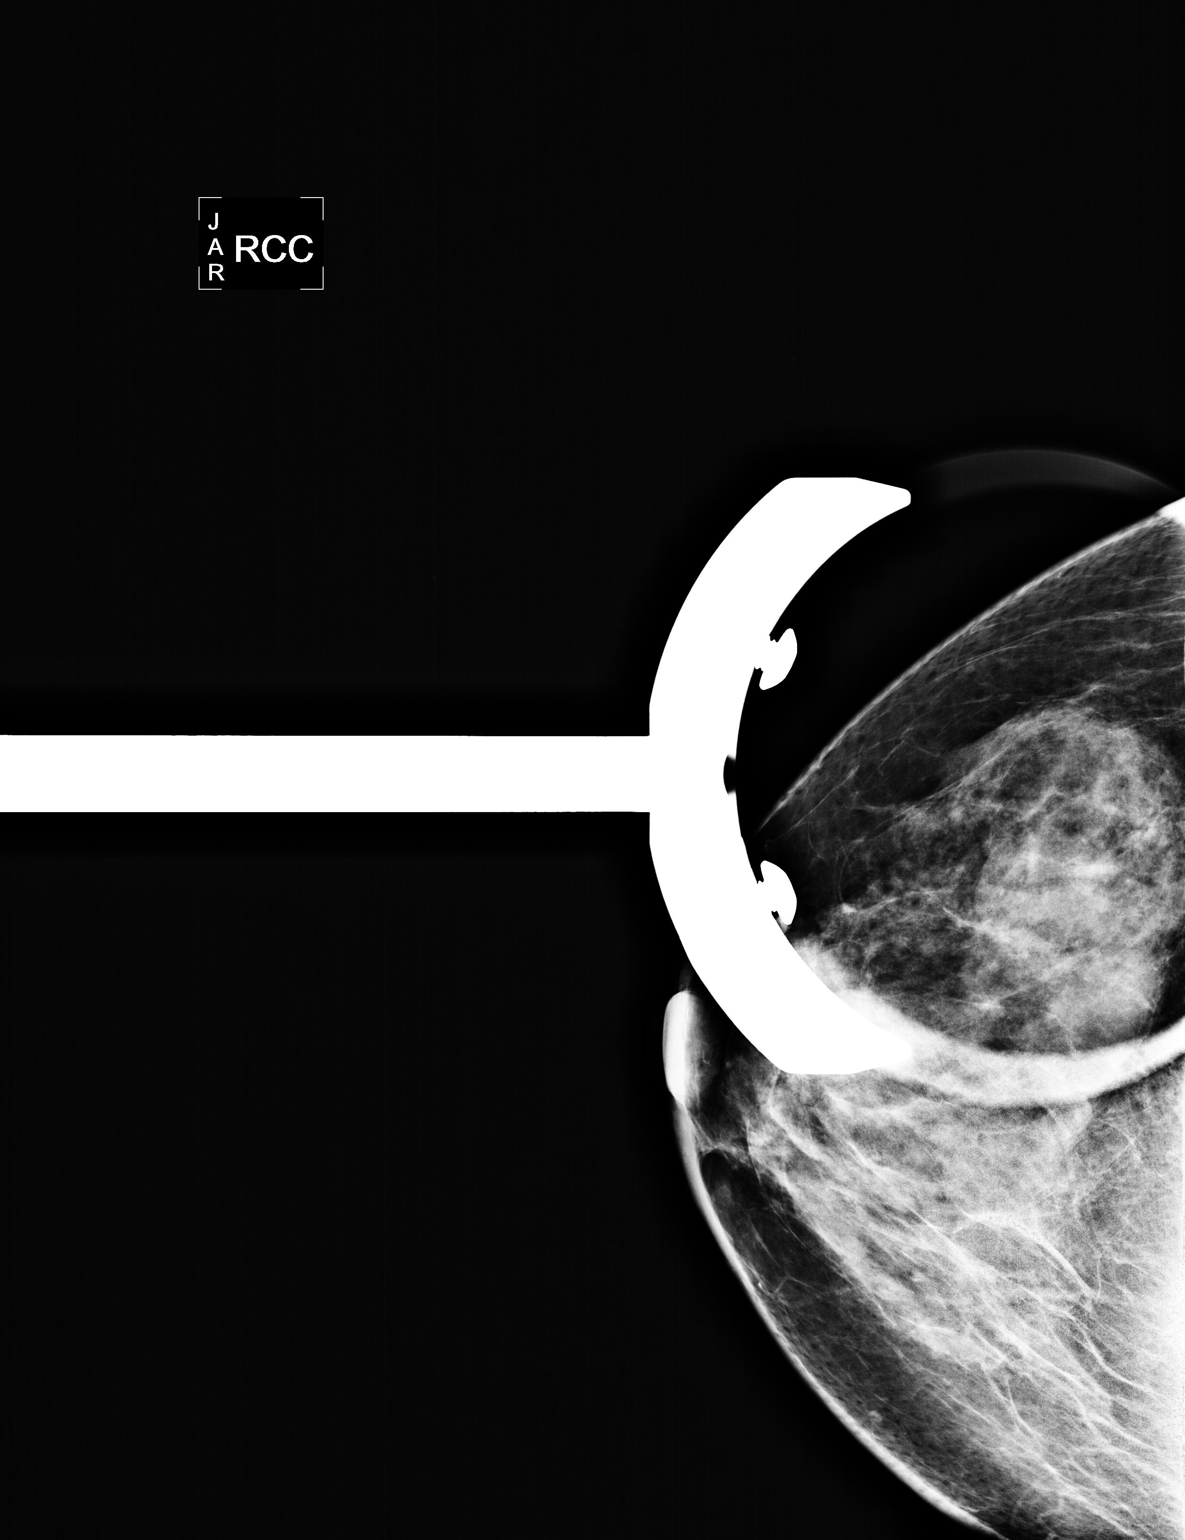

[R MLO]
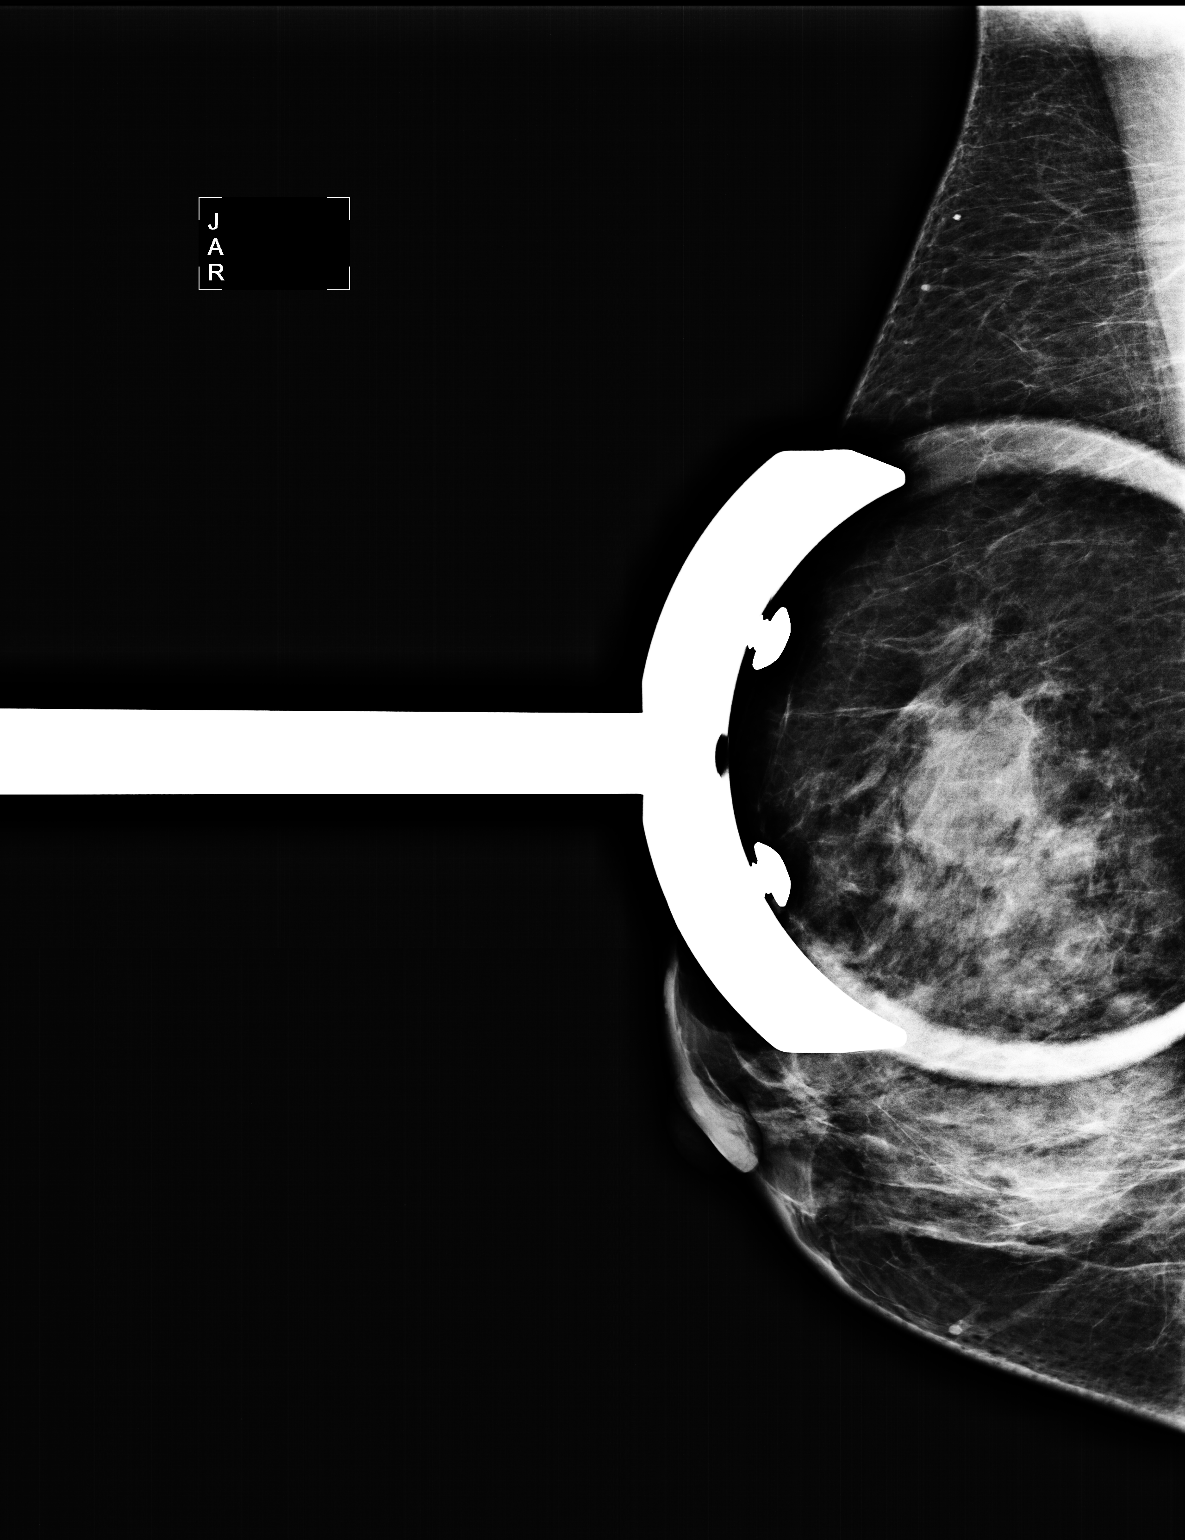

[2 of 2 positions shown; findings below may reference images not displayed]

FINDINGS: Spot compression images again demonstrate to a partially
circumscribed 1.5 to 2 cm mass in the upper outer right breast.

Ultrasound is performed, showing a well defined ovoid 1.8 cm simple
cyst at the 10 o'clock position of the right breast 5 cm from the
nipple corresponding to the mammographic abnormality.
IMPRESSION: 1.8 cm simple cyst at the 10 o'clock position of the right breast.

Recommendations:  Recommend continued annual bilateral screening
mammographic followup.

BI-RADS CATEGORY 2:  Benign finding(s).

## 2009-04-09 IMAGING — US UNKNOWN US STUDY
1 series · 4 of 4 positions shown · non-contrast
Comparison: [DATE] and [DATE]

CLINICAL DATA: Patient presents for additional views of the right
breast as a follow up to recent screening mammogram from [DATE]
demonstrating possible mass in the upper outer right breast.

DIGITAL DIAGNOSTIC  RIGHT  MAMMOGRAM   AND RIGHT BREAST
ULTRASOUND:

[Series 1: unknown us study · 4 of 4 slices shown]
[im 1/4]
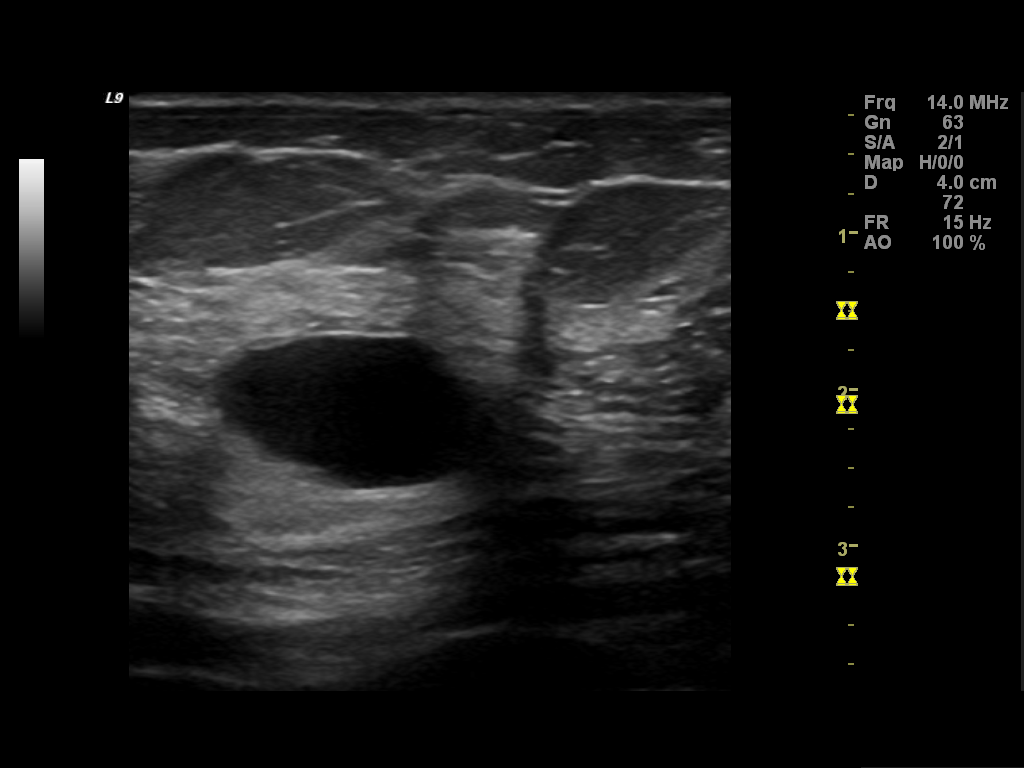
[im 2/4]
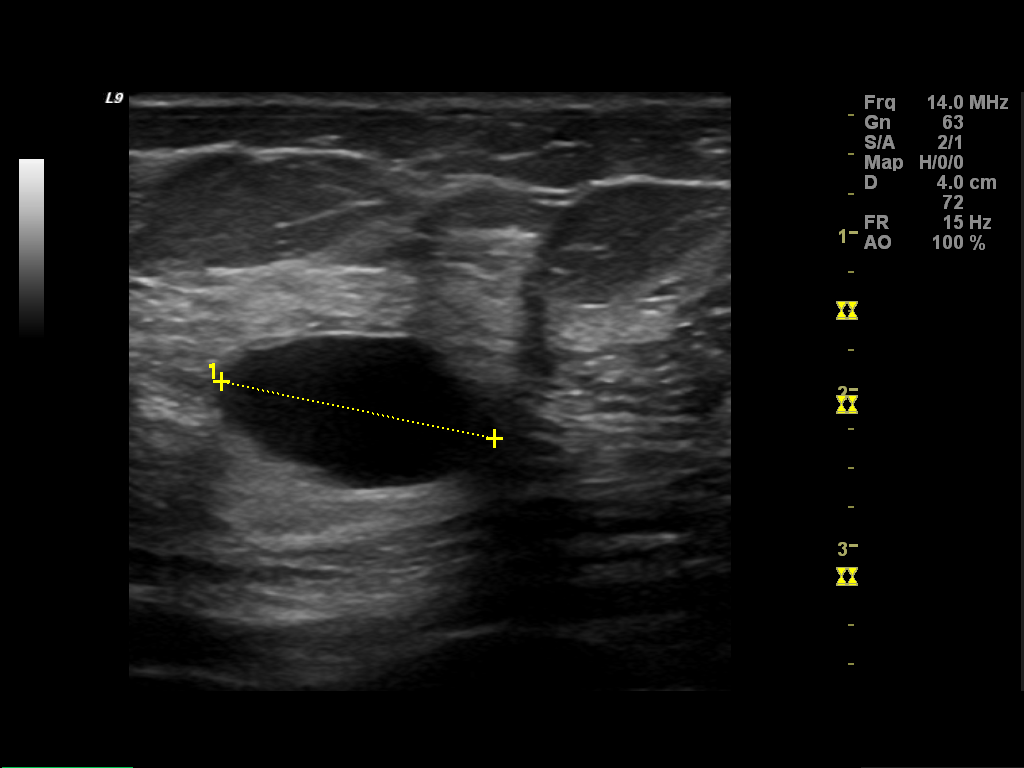
[im 3/4]
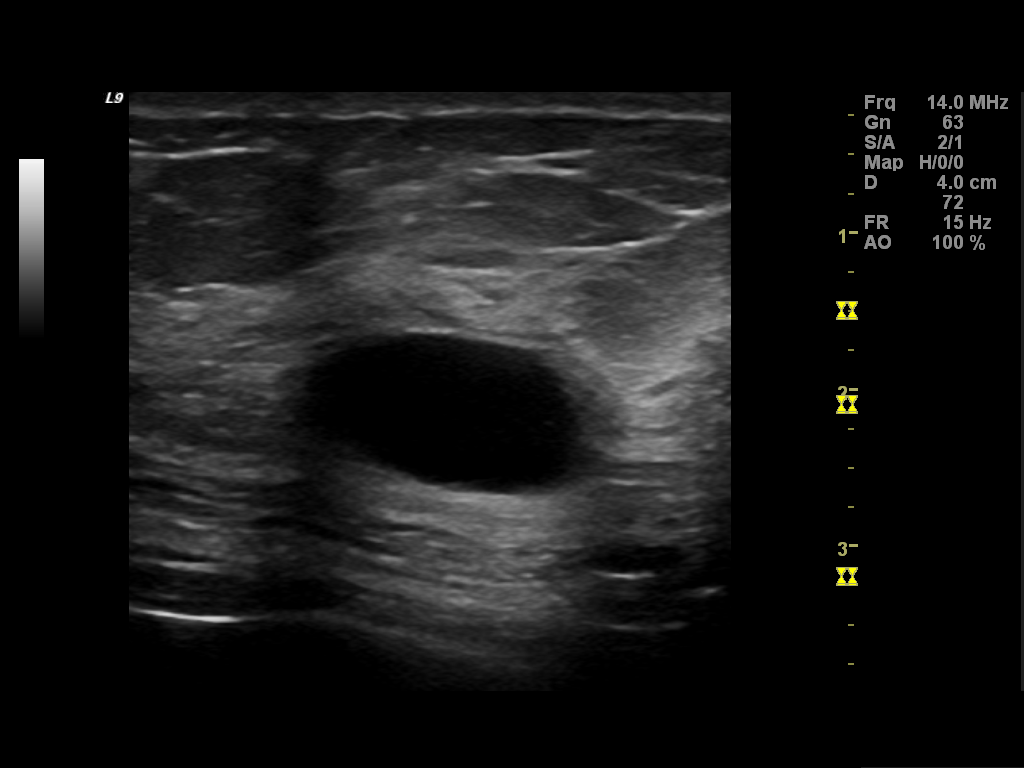
[im 4/4]
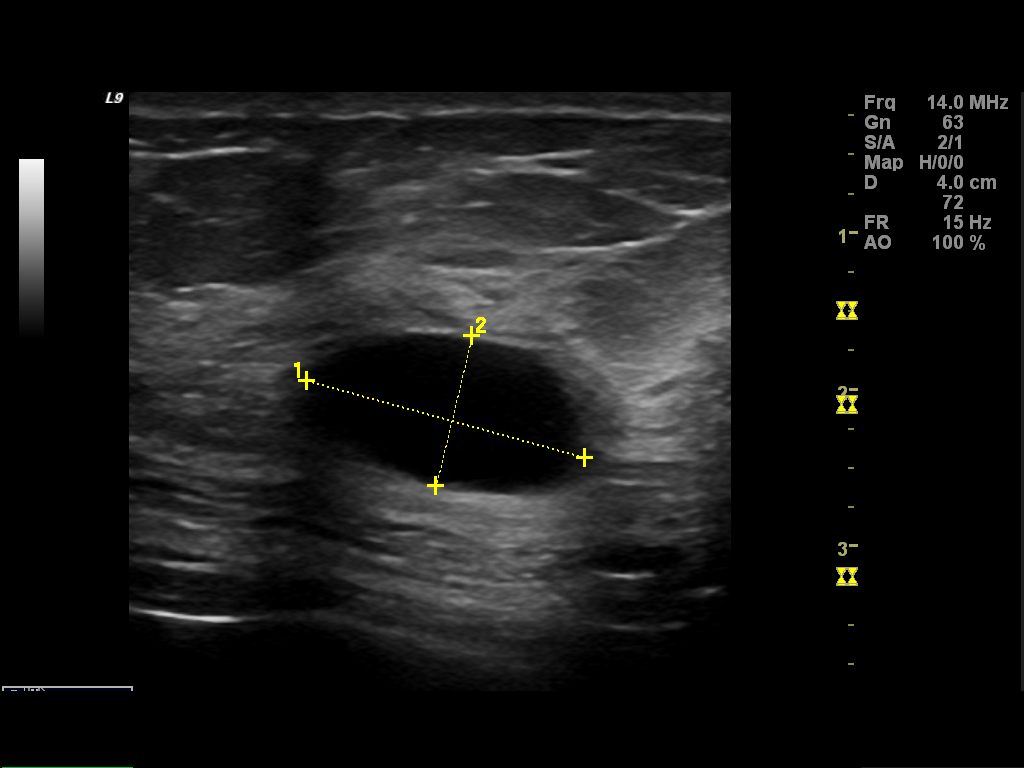

[4 of 4 positions shown; findings below may reference images not displayed]

FINDINGS: Spot compression images again demonstrate to a partially
circumscribed 1.5 to 2 cm mass in the upper outer right breast.

Ultrasound is performed, showing a well defined ovoid 1.8 cm simple
cyst at the 10 o'clock position of the right breast 5 cm from the
nipple corresponding to the mammographic abnormality.
IMPRESSION: 1.8 cm simple cyst at the 10 o'clock position of the right breast.

Recommendations:  Recommend continued annual bilateral screening
mammographic followup.

BI-RADS CATEGORY 2:  Benign finding(s).

## 2009-10-29 ENCOUNTER — Ambulatory Visit: Payer: Self-pay | Admitting: Women's Health

## 2009-11-05 ENCOUNTER — Ambulatory Visit: Payer: Self-pay | Admitting: Women's Health

## 2009-11-05 ENCOUNTER — Other Ambulatory Visit: Admission: RE | Admit: 2009-11-05 | Discharge: 2009-11-05 | Payer: Self-pay | Admitting: Obstetrics and Gynecology

## 2009-12-03 ENCOUNTER — Ambulatory Visit (HOSPITAL_COMMUNITY): Admission: RE | Admit: 2009-12-03 | Discharge: 2009-12-03 | Payer: Self-pay | Admitting: Obstetrics and Gynecology

## 2009-12-03 IMAGING — US US SOFT TISSUE HEAD/NECK
1 series · 14 of 25 positions shown · non-contrast
Comparison: Thyroid ultrasound [DATE]

CLINICAL DATA: Thyroid nodule.  Hyperthyroidism.

THYROID ULTRASOUND
TECHNIQUE: Ultrasound examination of the thyroid gland and
adjacent soft tissues was performed.

[Series 1: us soft tissue head/neck · 0.07mm/px · 14 of 49 slices shown]
[im 1/49]
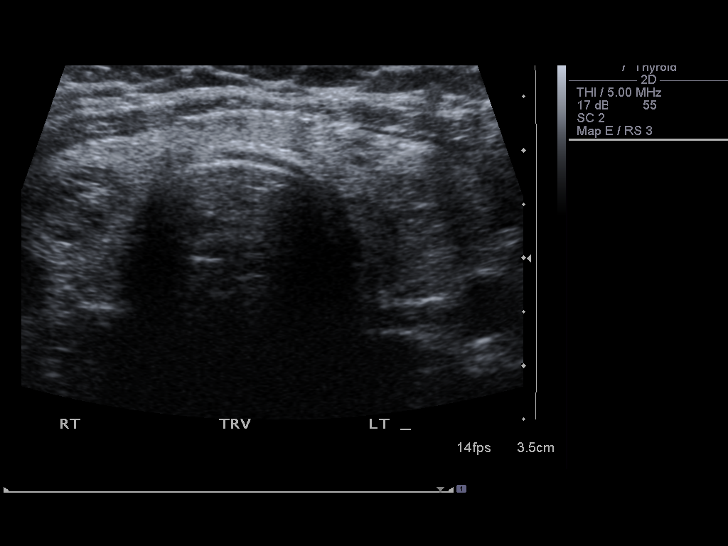
[im 5/49]
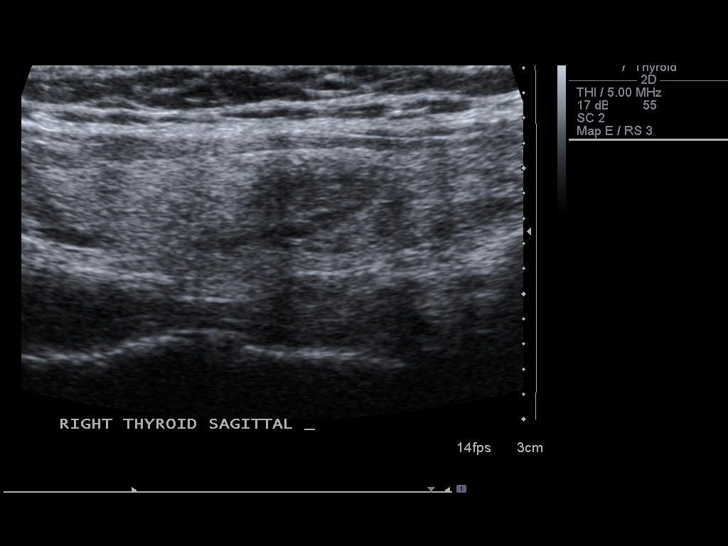
[im 9/49]
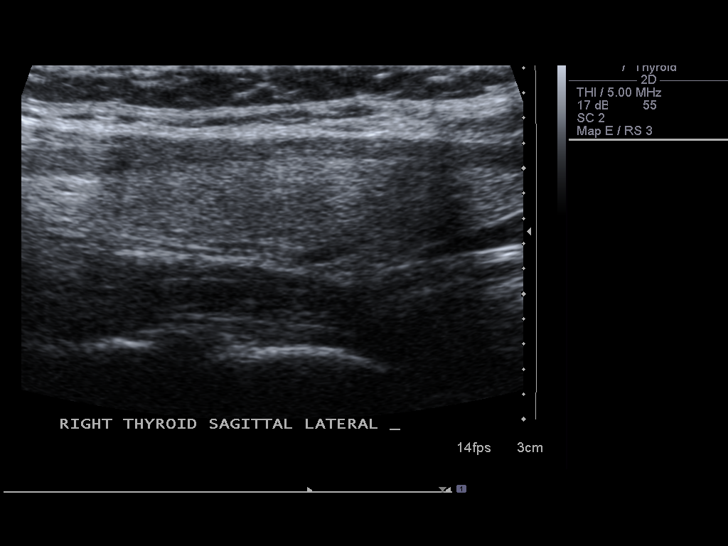
[im 13/49]
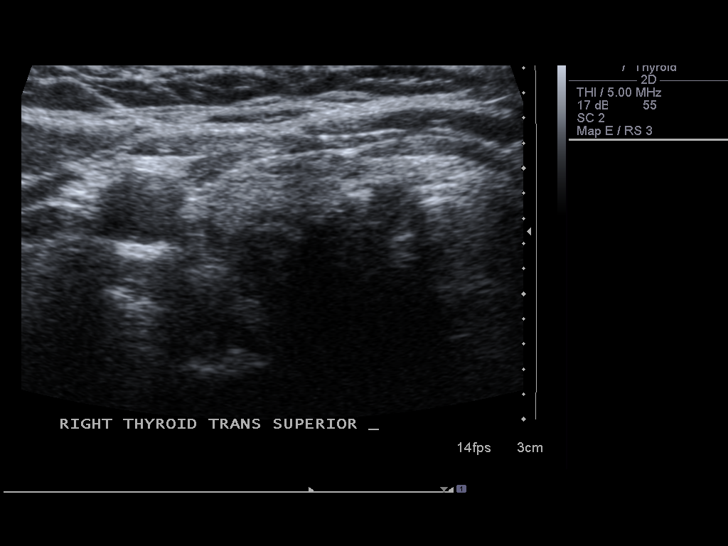
[im 17/49]
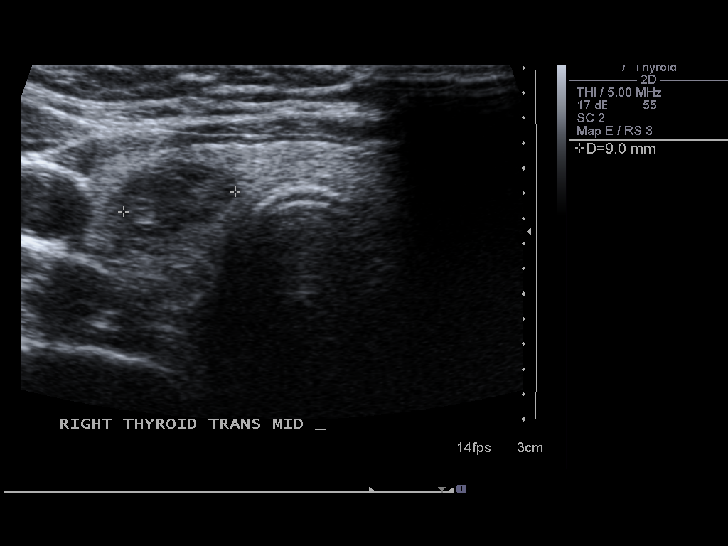
[im 19/49]
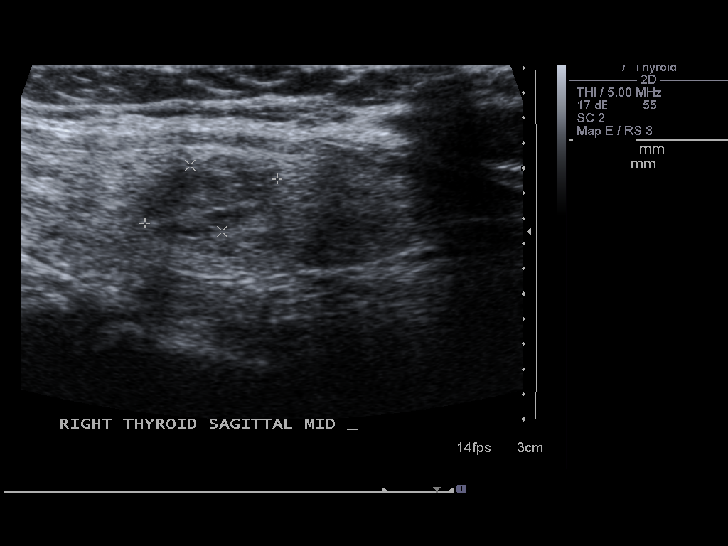
[im 23/49]
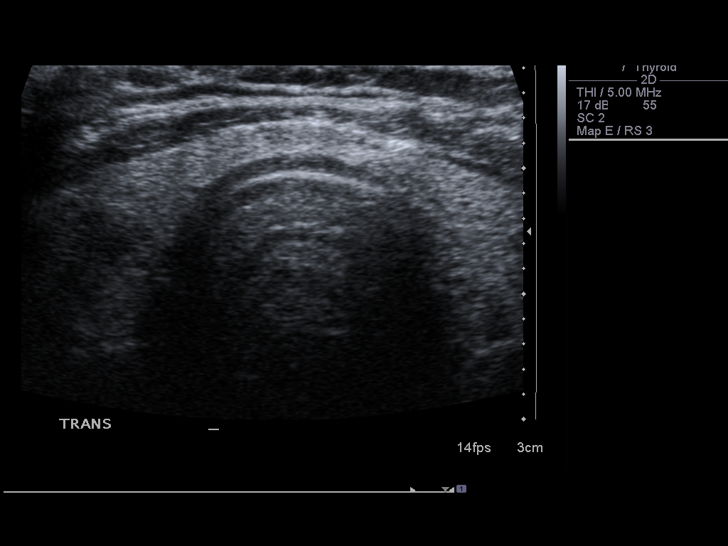
[im 27/49]
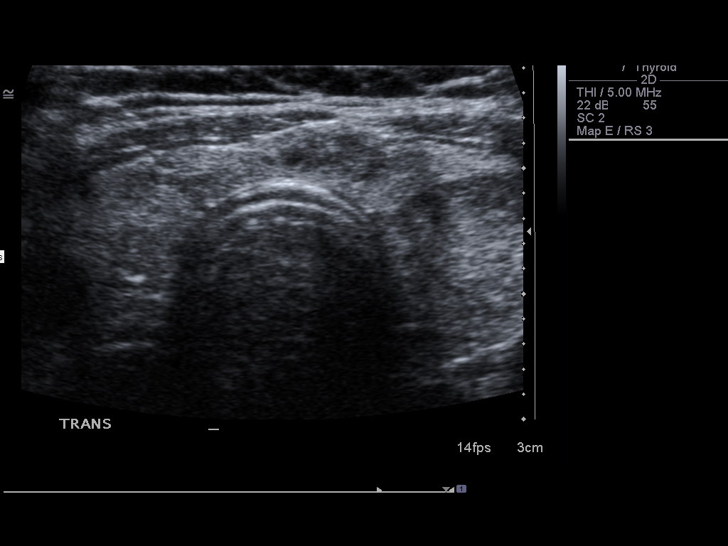
[im 31/49]
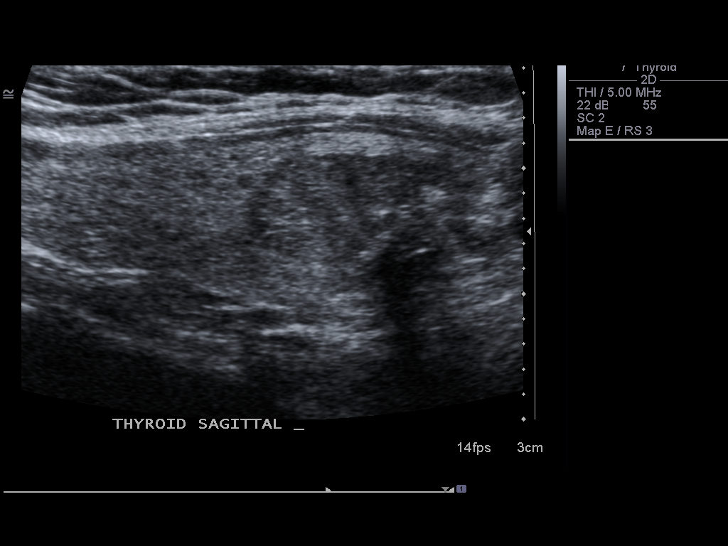
[im 33/49]
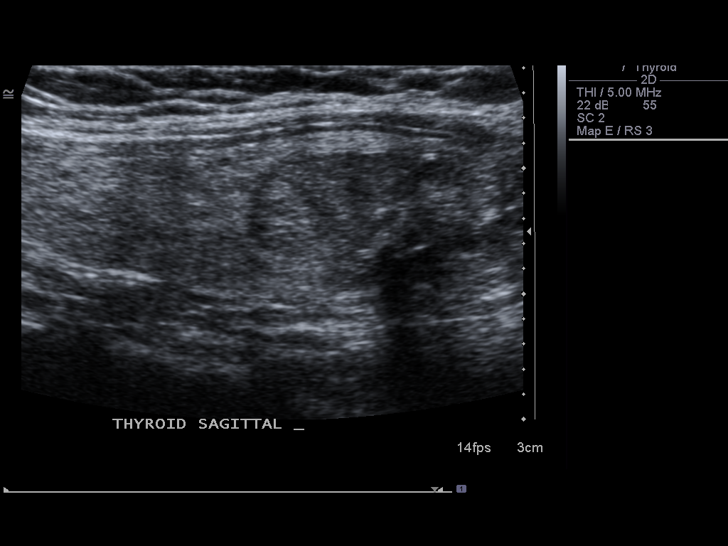
[im 37/49]
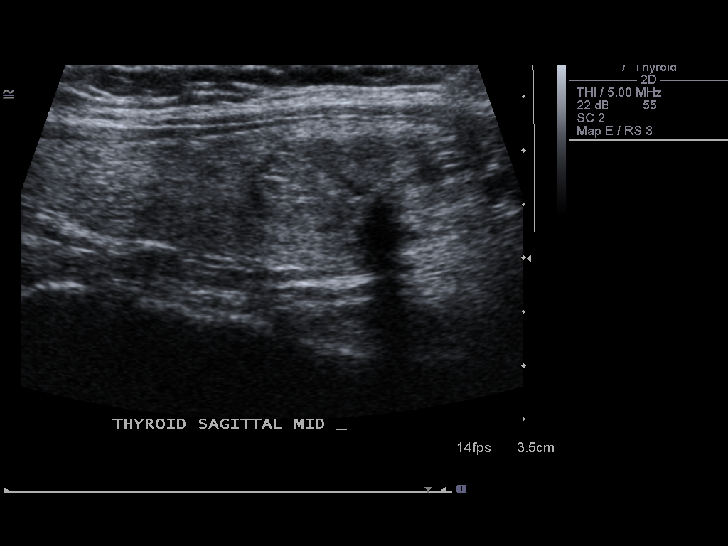
[im 41/49]
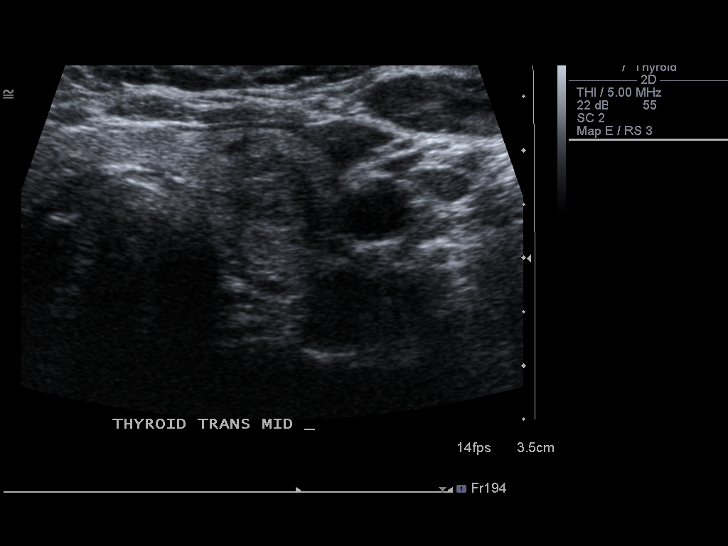
[im 45/49]
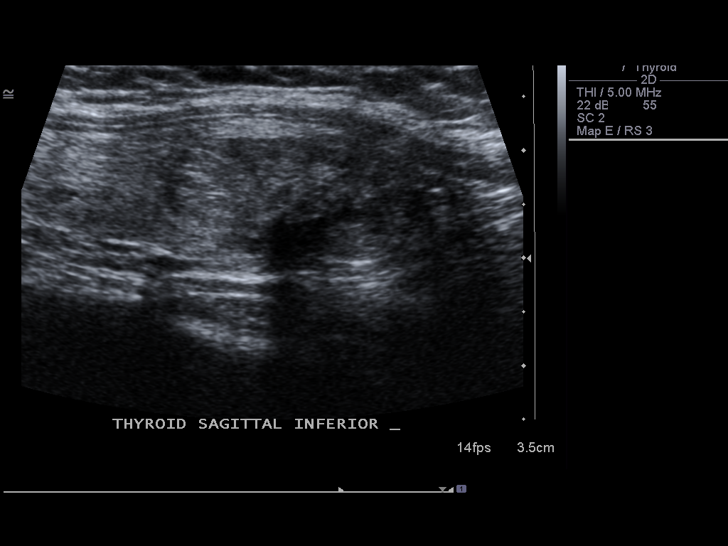
[im 49/49]
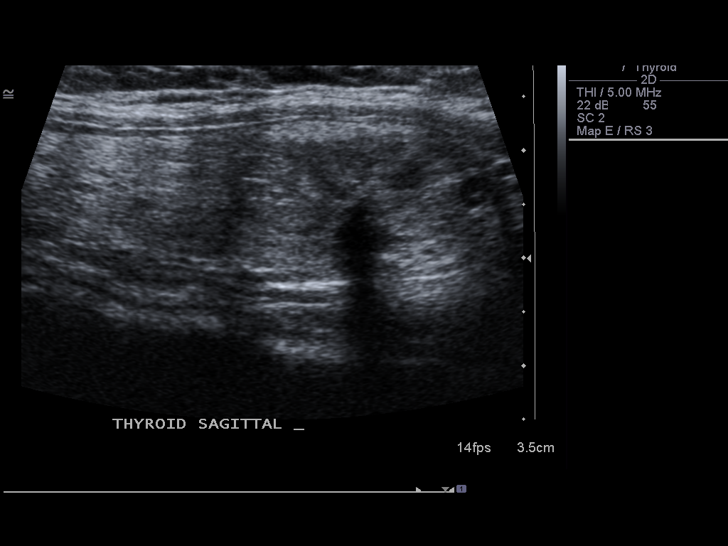

[14 of 25 positions shown; findings below may reference images not displayed]

FINDINGS: The right lobe measures 4.0 x 1.2 x 1.3 cm.  The left
lobe measures 4.7 x 1.3 x 1.3 cm.  The isthmus measures 0.5 cm.
The echo texture is diffusely heterogeneous.  There are bilateral
focal thyroid nodules.  There is a 1.2 x 0.6 x 0.9 cm nodule in the
mid right thyroid lobe.  This previously measured 0.8 x 0.5 x
cm.  There is an isthmic nodule which measures 1.0 x 0.4 x 0.6 cm.
The dominant lesion in the left thyroid lobe measures 2.6 x 1.2 x
1.5 cm.  This is stable.  It previously measured 2.8 x 1.7 x
cm.  No new nodules are seen.
IMPRESSION: 1.  Bilateral thyroid nodules.  The mid right thyroid lobe lesion
has increased slightly in size since the prior examination.  The
dominant nodule in the left thyroid lobe is stable to slightly
smaller.
2.  Recommend follow-up thyroid ultrasound examination in 1 year.

## 2010-02-11 ENCOUNTER — Ambulatory Visit (HOSPITAL_COMMUNITY): Admission: RE | Admit: 2010-02-11 | Discharge: 2010-02-11 | Payer: Self-pay | Admitting: Internal Medicine

## 2010-02-11 IMAGING — RF DG ESOPHAGUS
9 of 15 series · 12 of 24 positions shown · non-contrast
Comparison: None.

CLINICAL DATA: Dysphagia

ESOPHOGRAM/BARIUM SWALLOW
TECHNIQUE: Combined double contrast and single contrast
examination performed using effervescent crystals, thick barium
liquid, and thin barium liquid.
Fluoroscopy time:  1.9 minutes.

[Series 1: run · 2 of 7 slices shown (1 of 9)]
[im 2/7]
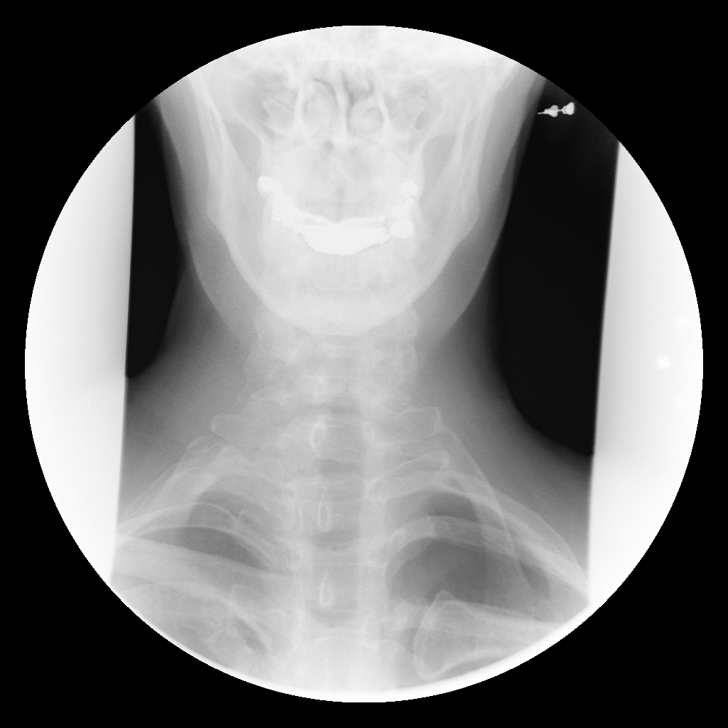
[im 5/7]
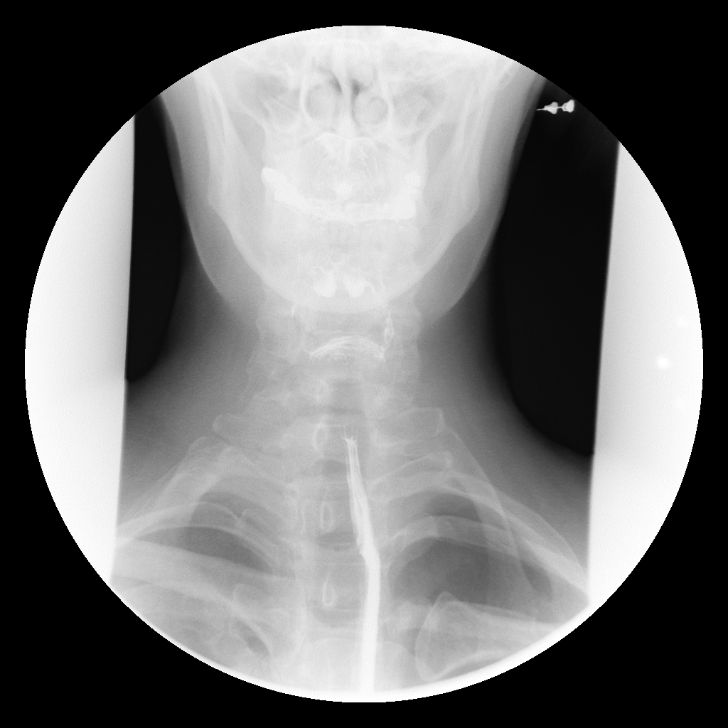

[Series 2: run · 2 of 5 slices shown (2 of 9)]
[im 1/5]
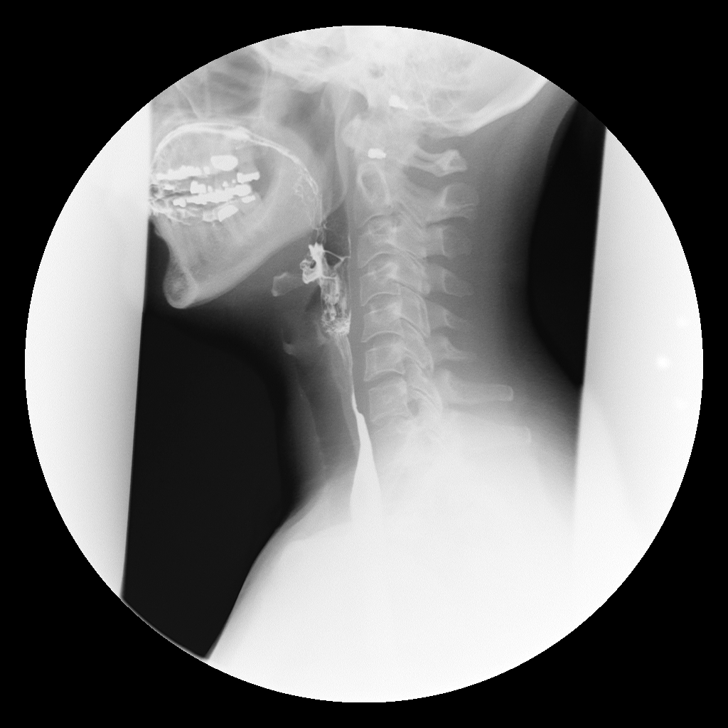
[im 5/5]
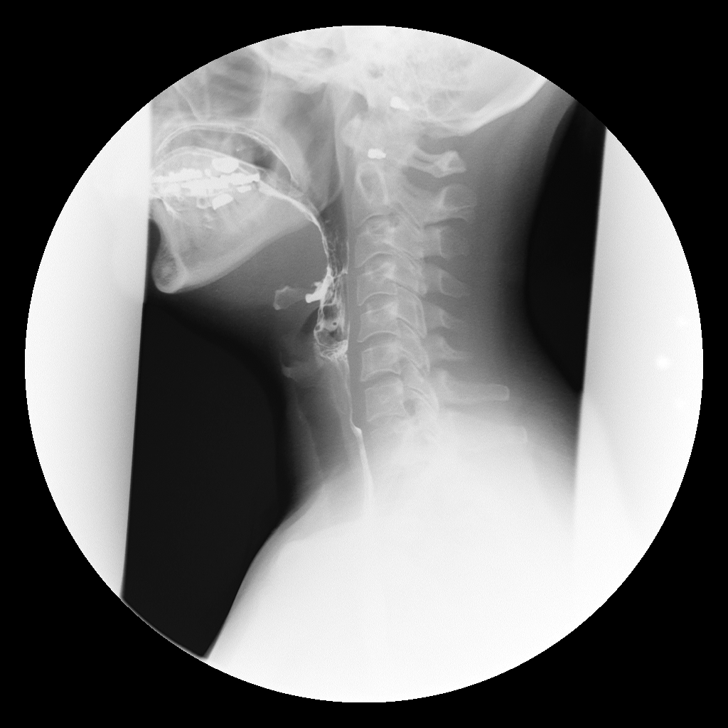

[Series 4: run · 1 of 1 slices shown (3 of 9)]
[im 1/1]
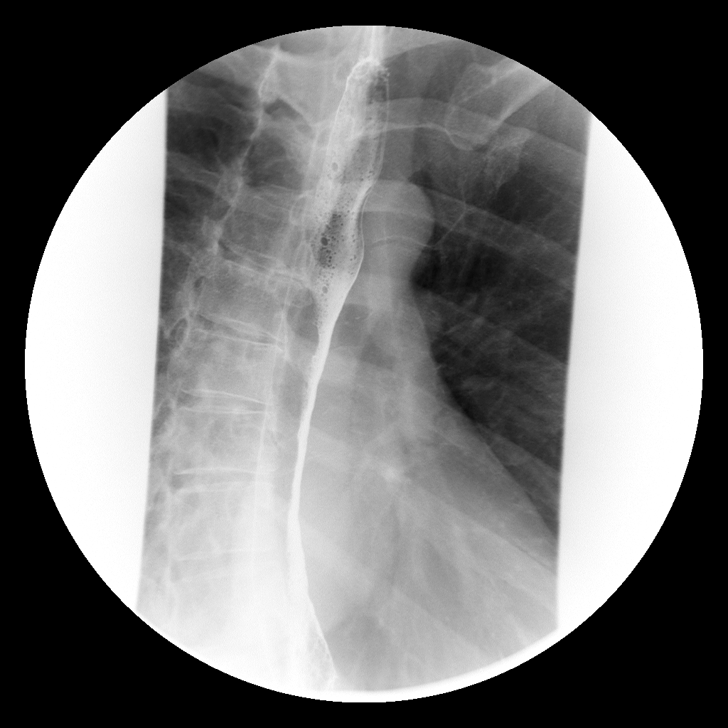

[Series 6: run · 2 of 6 slices shown (4 of 9)]
[im 1/6]
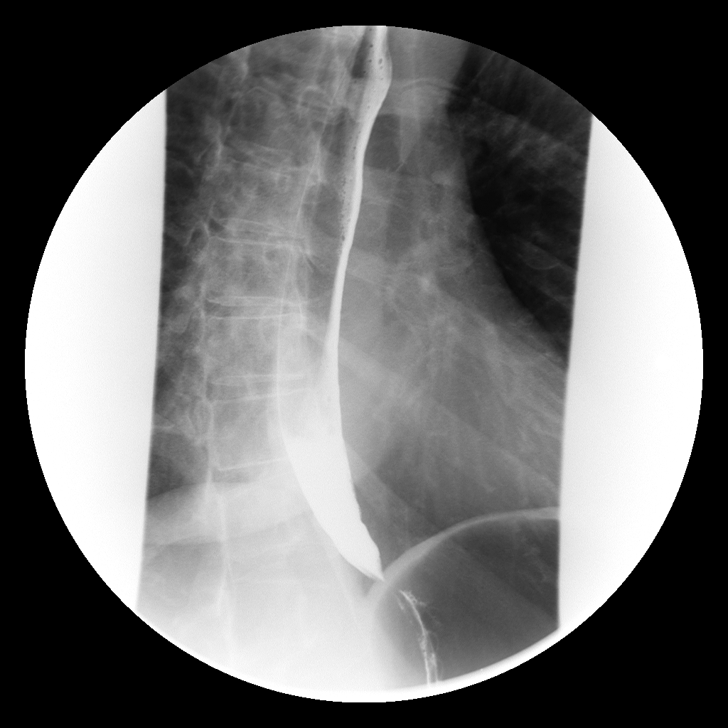
[im 4/6]
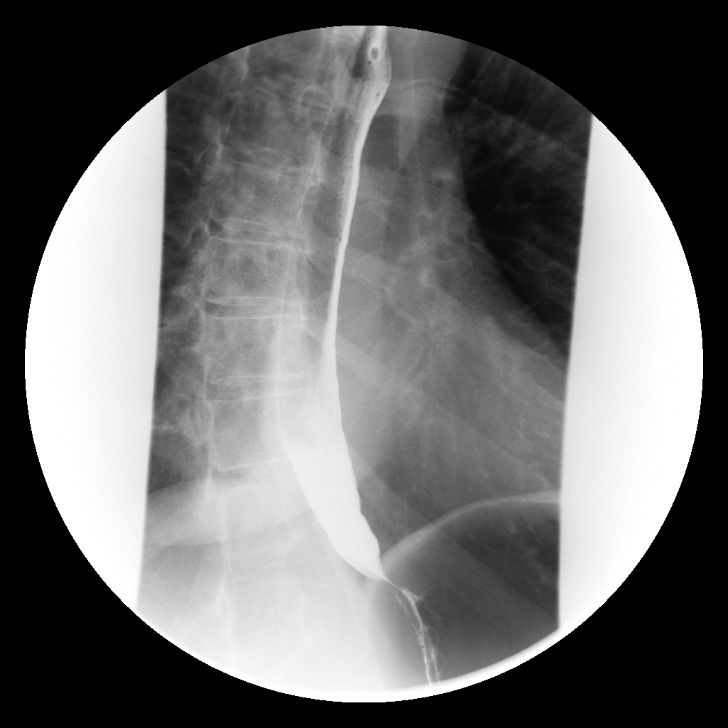

[Series 7: run · 1 of 1 slices shown (5 of 9)]
[im 1/1]
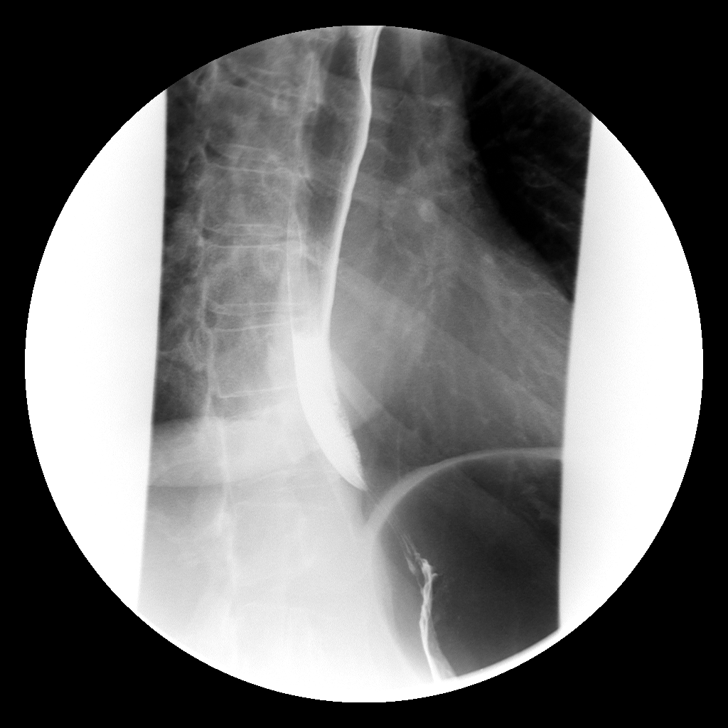

[Series 9: run · 1 of 1 slices shown (6 of 9)]
[im 1/1]
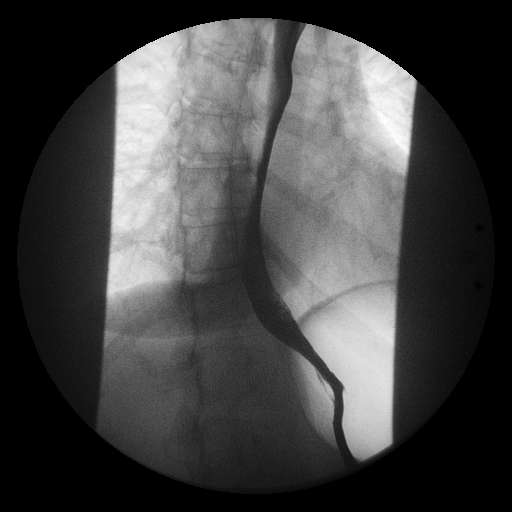

[Series 11: run · 1 of 1 slices shown (7 of 9)]
[im 1/1]
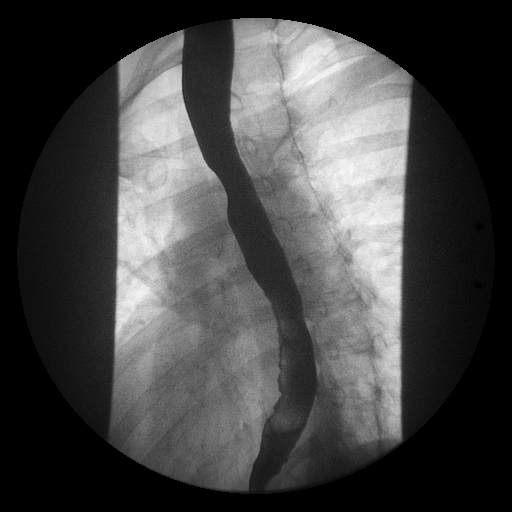

[Series 13: run · 1 of 1 slices shown (8 of 9)]
[im 1/1]
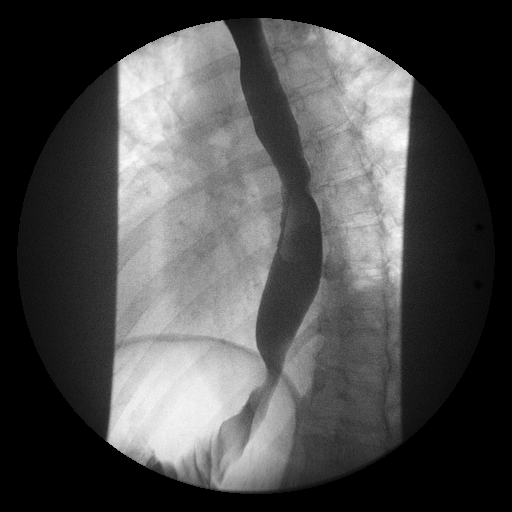

[Series 15: run · 1 of 1 slices shown (9 of 9)]
[im 1/1]
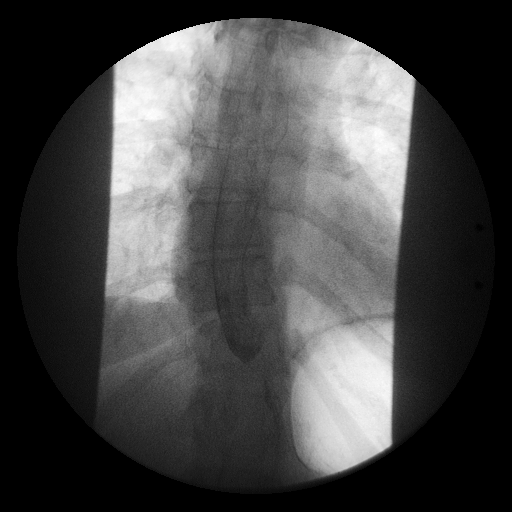

[12 of 24 positions shown; findings below may reference images not displayed]

FINDINGS: Frontal and lateral views of the hypopharynx while
swallowing are normal.

Double contrast imaging of the esophagus shows no evidence for
mucosal ulceration, esophageal mass, esophageal stricture, or
esophageal diverticulum.  No evidence for hiatal hernia.

On the RAO prone positioning, there is destruction of the primary
peristaltic wave in the mid and lower esophagus on all swallows,
consistent with nonspecific esophageal motility disorder.

A 13 mm barium tablet passes readily into the stomach when
administered with water.
IMPRESSION: Nonspecific esophageal motility disorder.  Otherwise normal exam.

## 2010-07-08 ENCOUNTER — Encounter: Admission: RE | Admit: 2010-07-08 | Discharge: 2010-07-08 | Payer: Self-pay | Admitting: Gynecology

## 2010-07-08 IMAGING — MG MM DIGITAL SCREENING
4 series · 4 of 4 positions shown · non-contrast
Comparison: none

DG SCREEN MAMMOGRAM BILATERAL
Bilateral CC and MLO view(s) were taken.

DIGITAL SCREENING MAMMOGRAM WITH CAD:
The breast tissue is extremely dense.  A possible mass is noted in the left breast.  Spot 
compression views and possibly sonography are recommended for further evaluation.  In the right 
breast, no masses or malignant type calcifications are identified.  Compared with prior studies.
Images were processed with CAD.

[R CC]
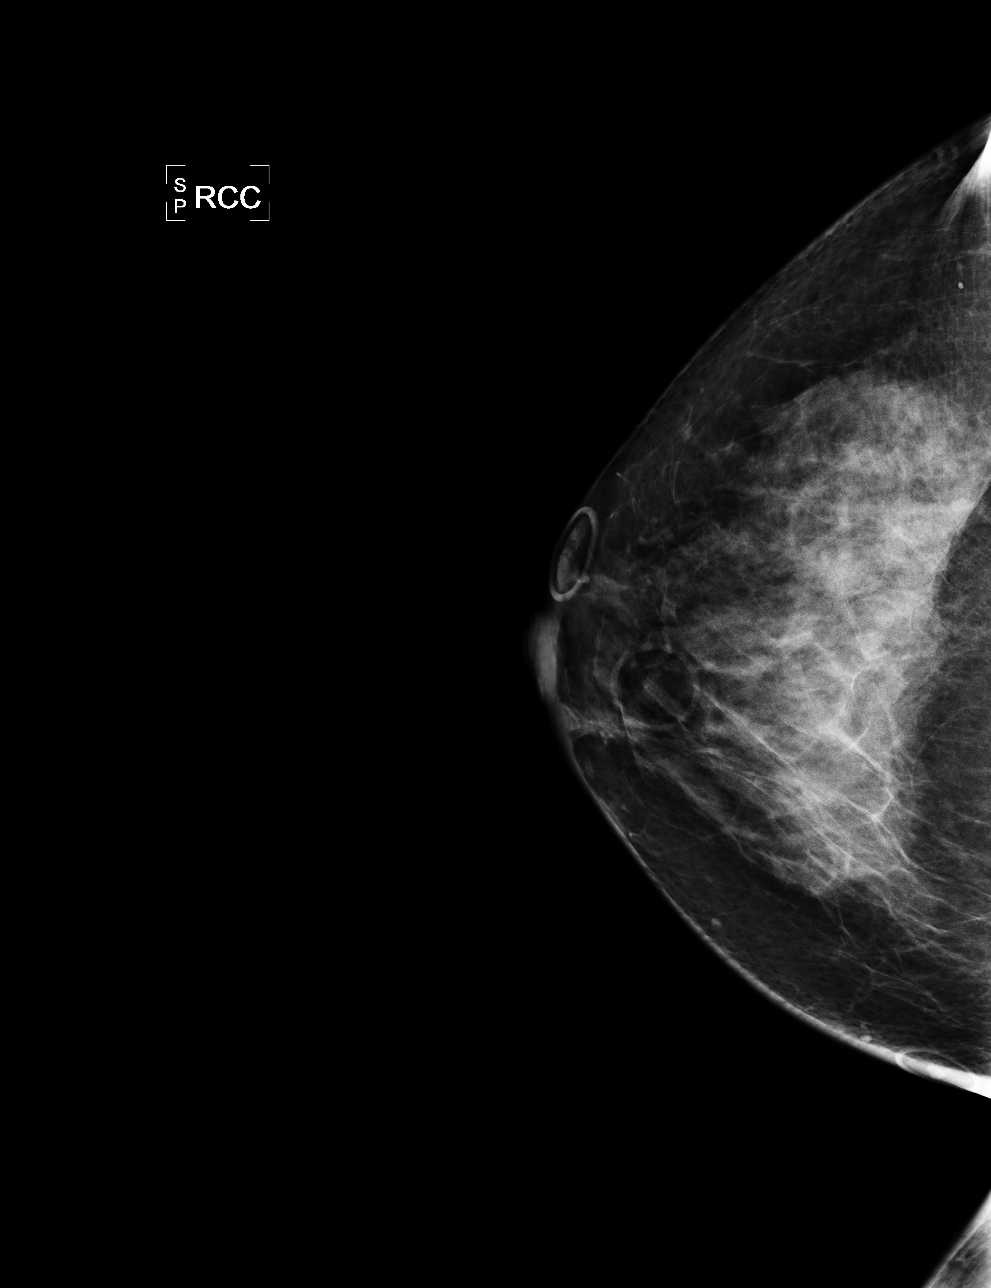

[L CC]
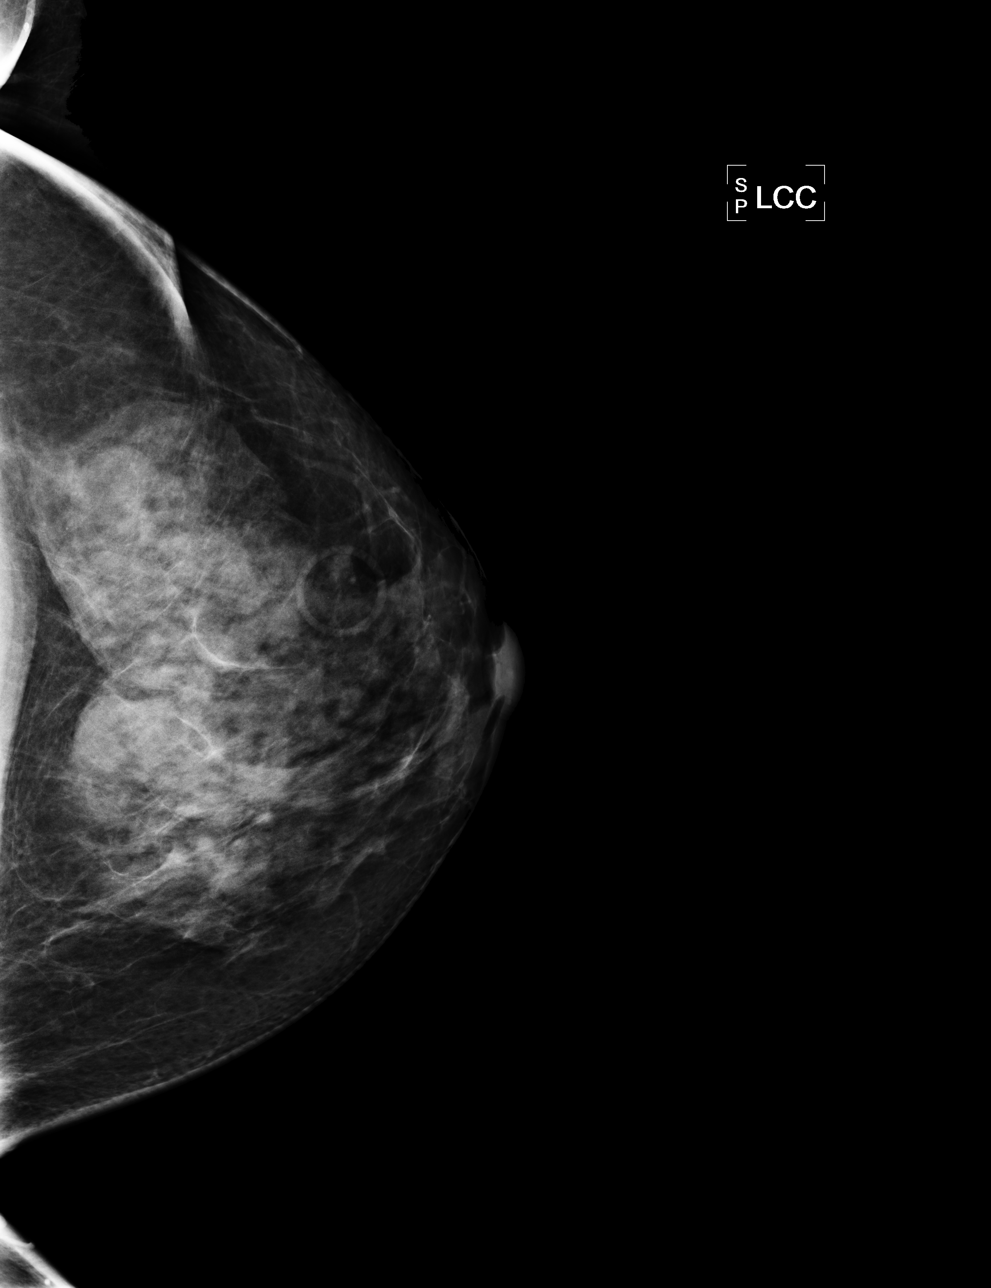

[L MLO]
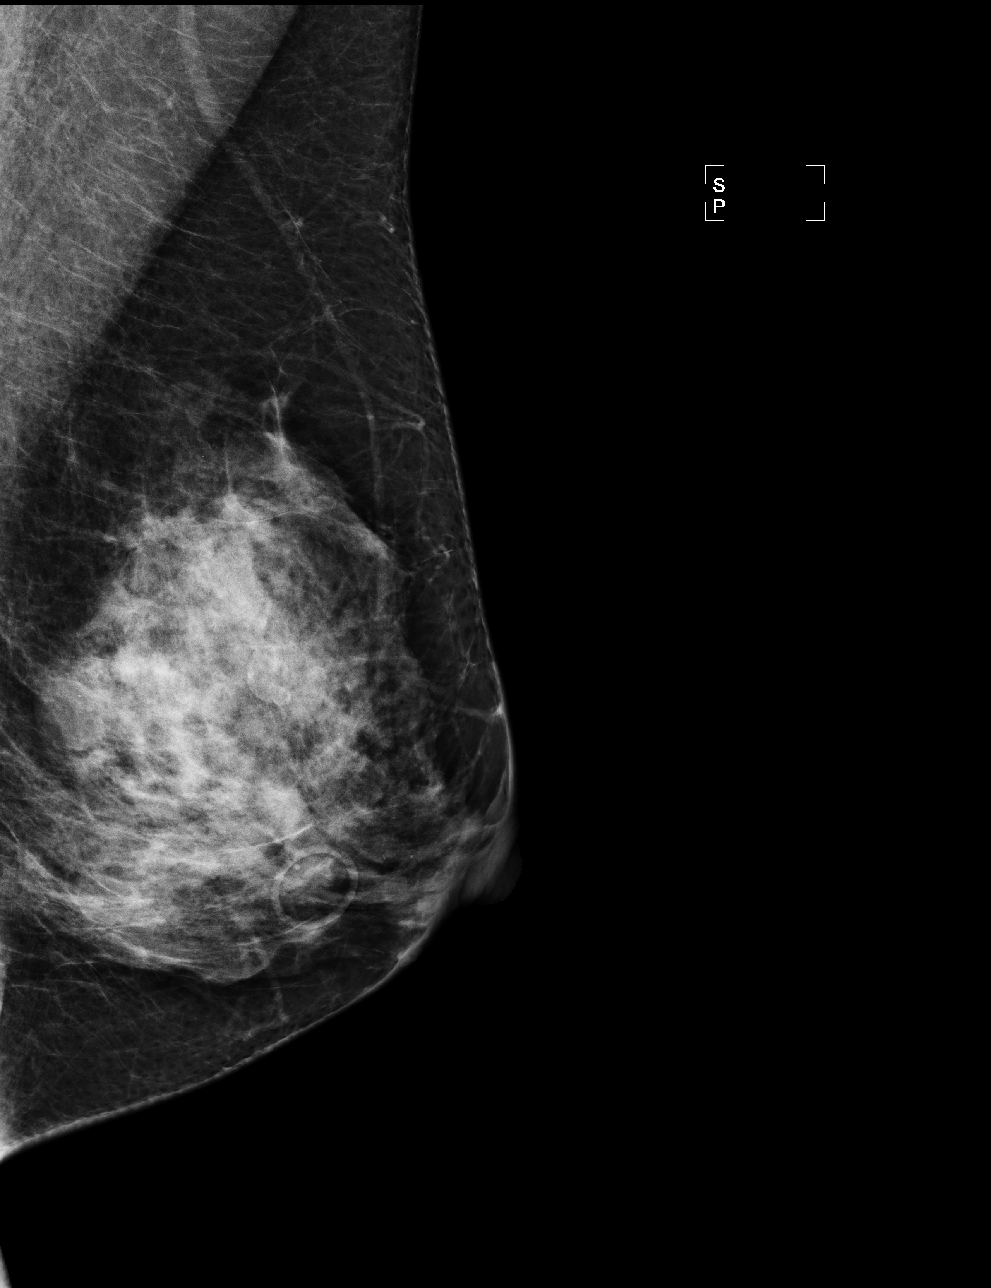

[R MLO]
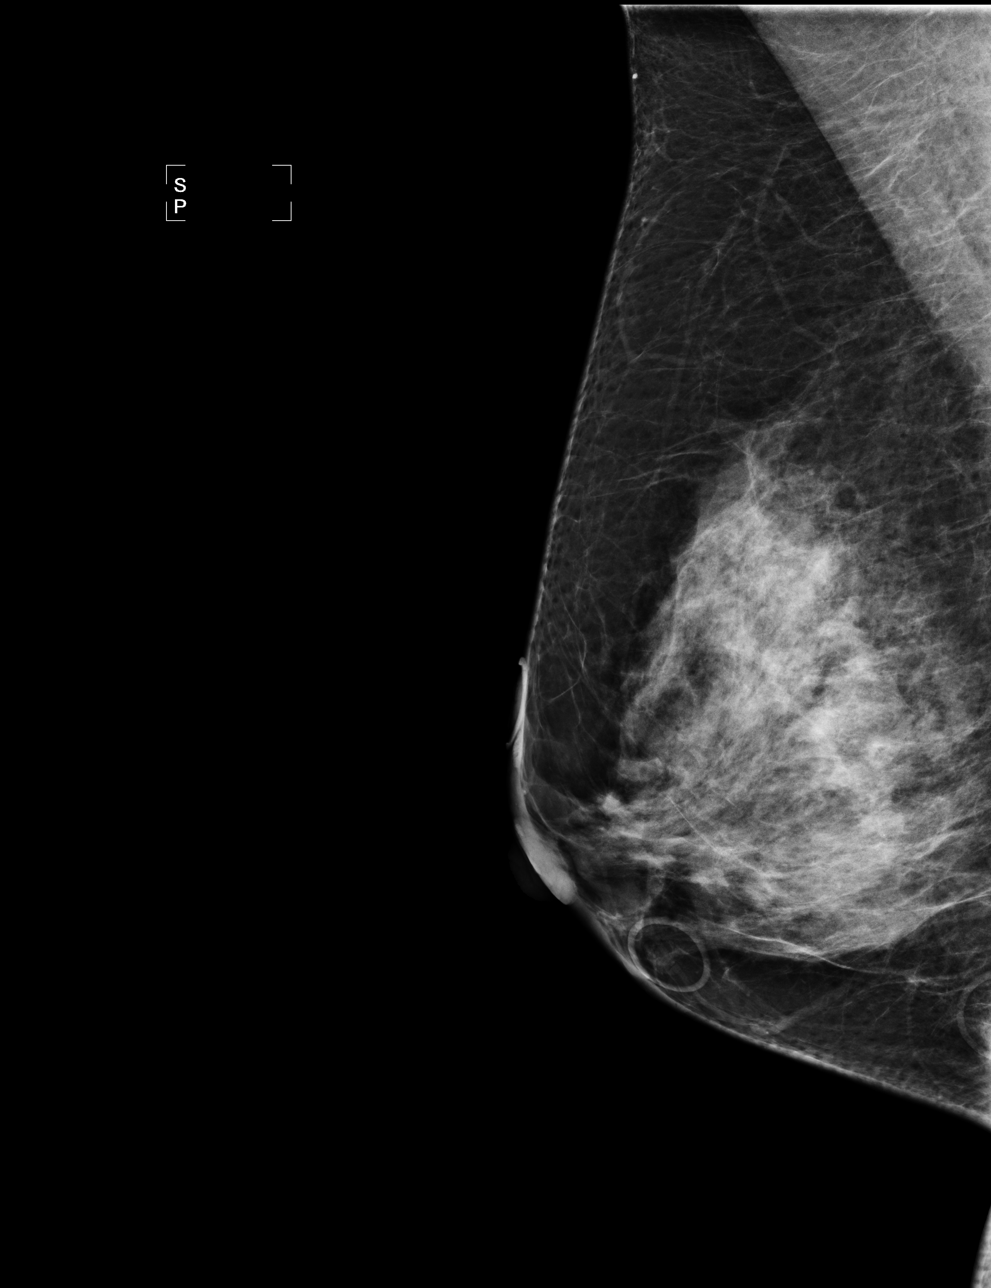

[4 of 4 positions shown; findings below may reference images not displayed]

IMPRESSION: Possible mass, left breast.  Additional evaluation is indicated.  The patient will be contacted for
additional studies and a supplementary report will follow.  No specific mammographic evidence of 
malignancy, right breast.

ASSESSMENT: Need additional imaging evaluation and/or prior mammograms for comparison - BI-RADS 0

Further imaging of the left breast.
,

## 2010-07-15 ENCOUNTER — Encounter: Admission: RE | Admit: 2010-07-15 | Discharge: 2010-07-15 | Payer: Self-pay | Admitting: Obstetrics and Gynecology

## 2010-07-15 IMAGING — MG MM DIGITAL DIAG LTD L
2 series · 2 of 2 positions shown · non-contrast
Comparison: With priors

CLINICAL DATA: Abnormal left screening mammogram

DIGITAL DIAGNOSTIC LEFT MAMMOGRAM WITH CAD

[L MLO]
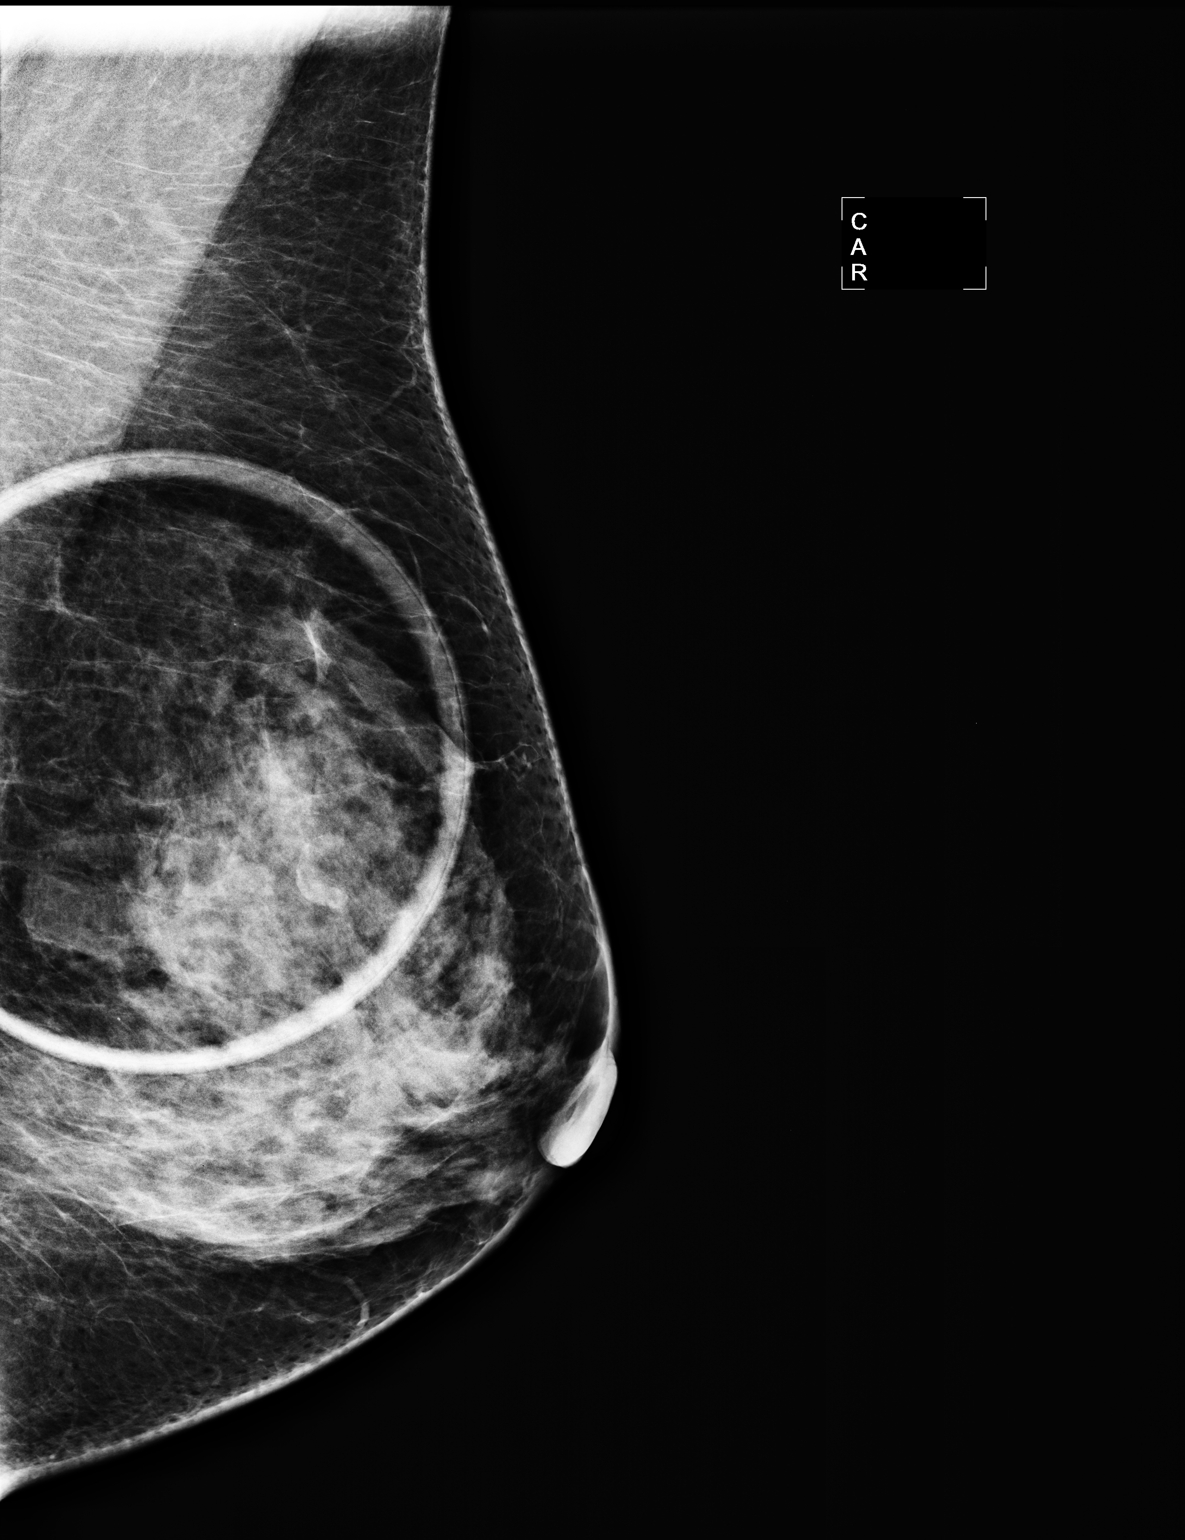

[L ML]
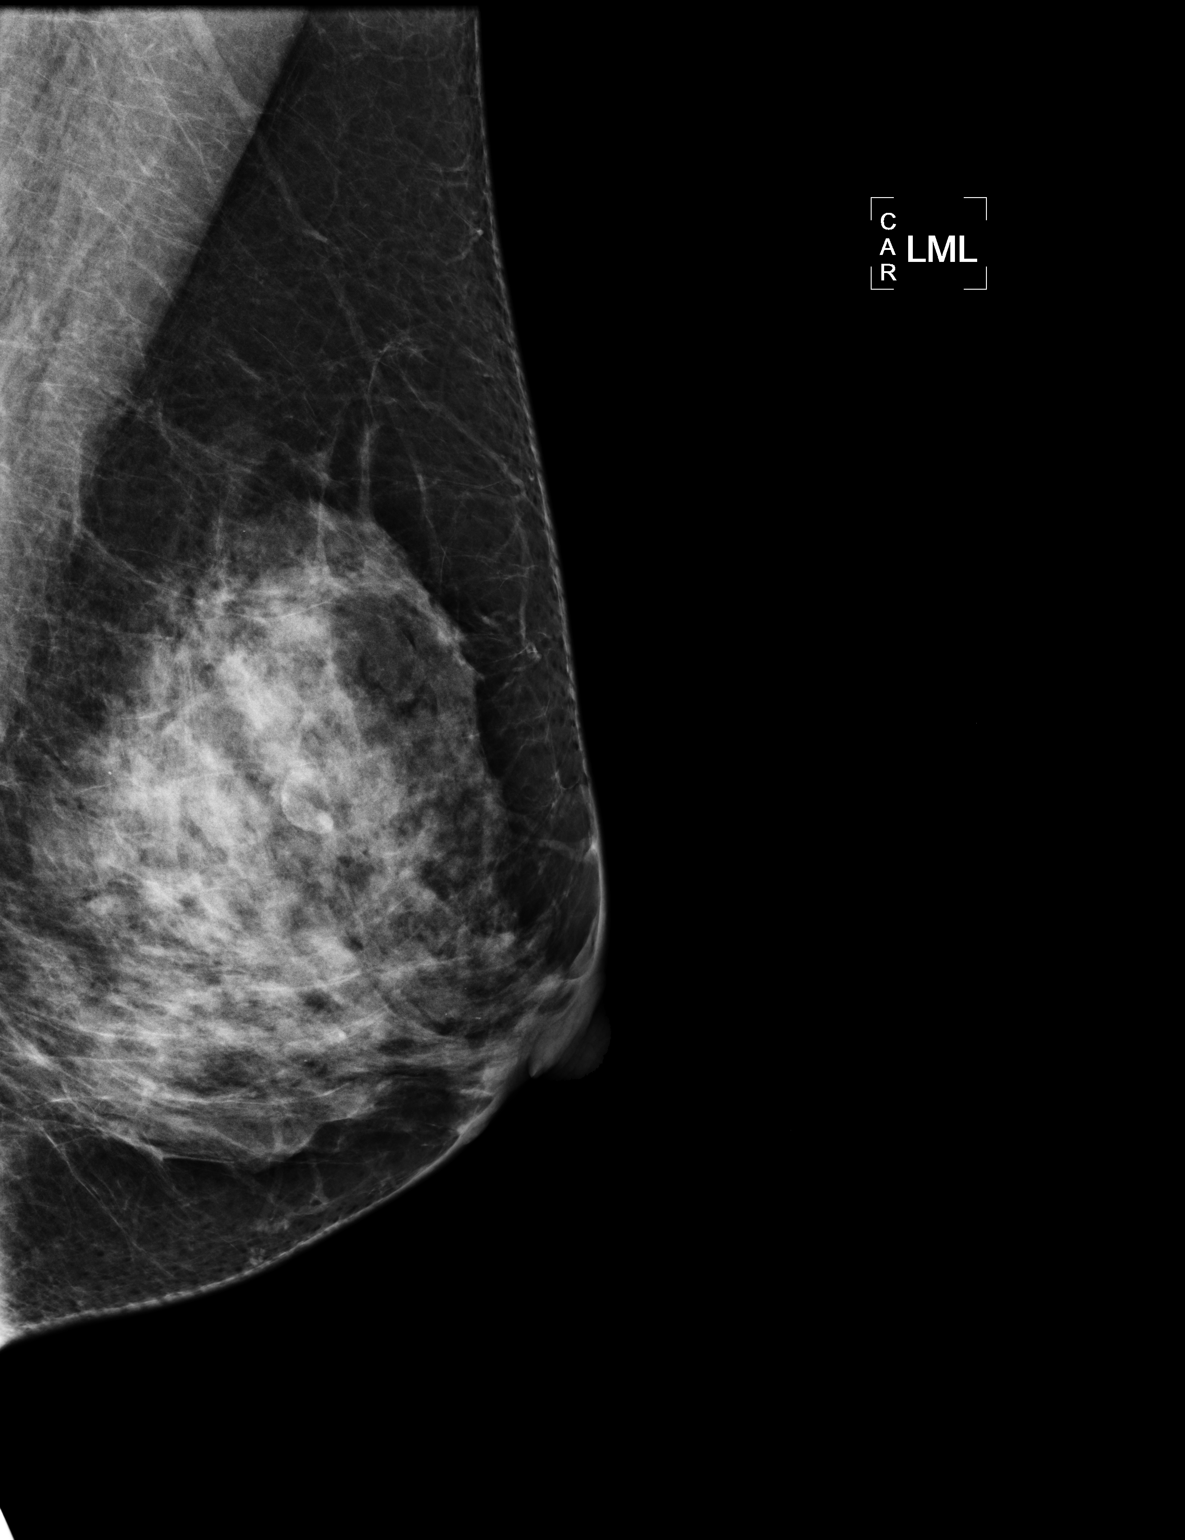

[2 of 2 positions shown; findings below may reference images not displayed]

FINDINGS: Spot compression and true lateral views of the left
breast were performed.  There is a dense fibroglandular pattern.
No persistent mass, distortion or malignant-type
microcalcifications are identified.
Mammographic images were processed with CAD.
IMPRESSION: No evidence of malignancy in the left breast.  Screening mammogram
in 1 year is recommended.

BI-RADS CATEGORY 1:  Negative.

## 2010-07-27 ENCOUNTER — Other Ambulatory Visit: Admission: RE | Admit: 2010-07-27 | Discharge: 2010-07-27 | Payer: Self-pay | Admitting: Obstetrics and Gynecology

## 2010-07-27 ENCOUNTER — Ambulatory Visit: Payer: Self-pay | Admitting: Women's Health

## 2010-11-06 ENCOUNTER — Encounter: Payer: Self-pay | Admitting: Obstetrics and Gynecology

## 2011-06-21 ENCOUNTER — Other Ambulatory Visit: Payer: Self-pay | Admitting: Gynecology

## 2011-06-21 DIAGNOSIS — Z1231 Encounter for screening mammogram for malignant neoplasm of breast: Secondary | ICD-10-CM

## 2011-07-12 ENCOUNTER — Ambulatory Visit: Payer: Self-pay

## 2011-07-18 ENCOUNTER — Ambulatory Visit
Admission: RE | Admit: 2011-07-18 | Discharge: 2011-07-18 | Disposition: A | Discharge status: Home / Self Care | Payer: No Typology Code available for payment source | Source: Ambulatory Visit | Attending: Gynecology | Admitting: Gynecology

## 2011-07-18 DIAGNOSIS — Z1231 Encounter for screening mammogram for malignant neoplasm of breast: Secondary | ICD-10-CM

## 2011-07-18 IMAGING — MG MM DIGITAL SCREENING {[facility]}
4 series · 4 of 4 positions shown · non-contrast
Comparison: none

DG SCREEN MAMMOGRAM BILATERAL
Bilateral CC and MLO view(s) were taken.

DIGITAL SCREENING MAMMOGRAM WITH CAD:
There are scattered fibroglandular densities.  Possible masses are noted in the left breast.  Spot 
compression views and possibly sonography are recommended for further evaluation.  In the right 
breast, no masses or malignant type calcifications are identified.  Compared with prior studies.
Images were processed with CAD.

[R CC]
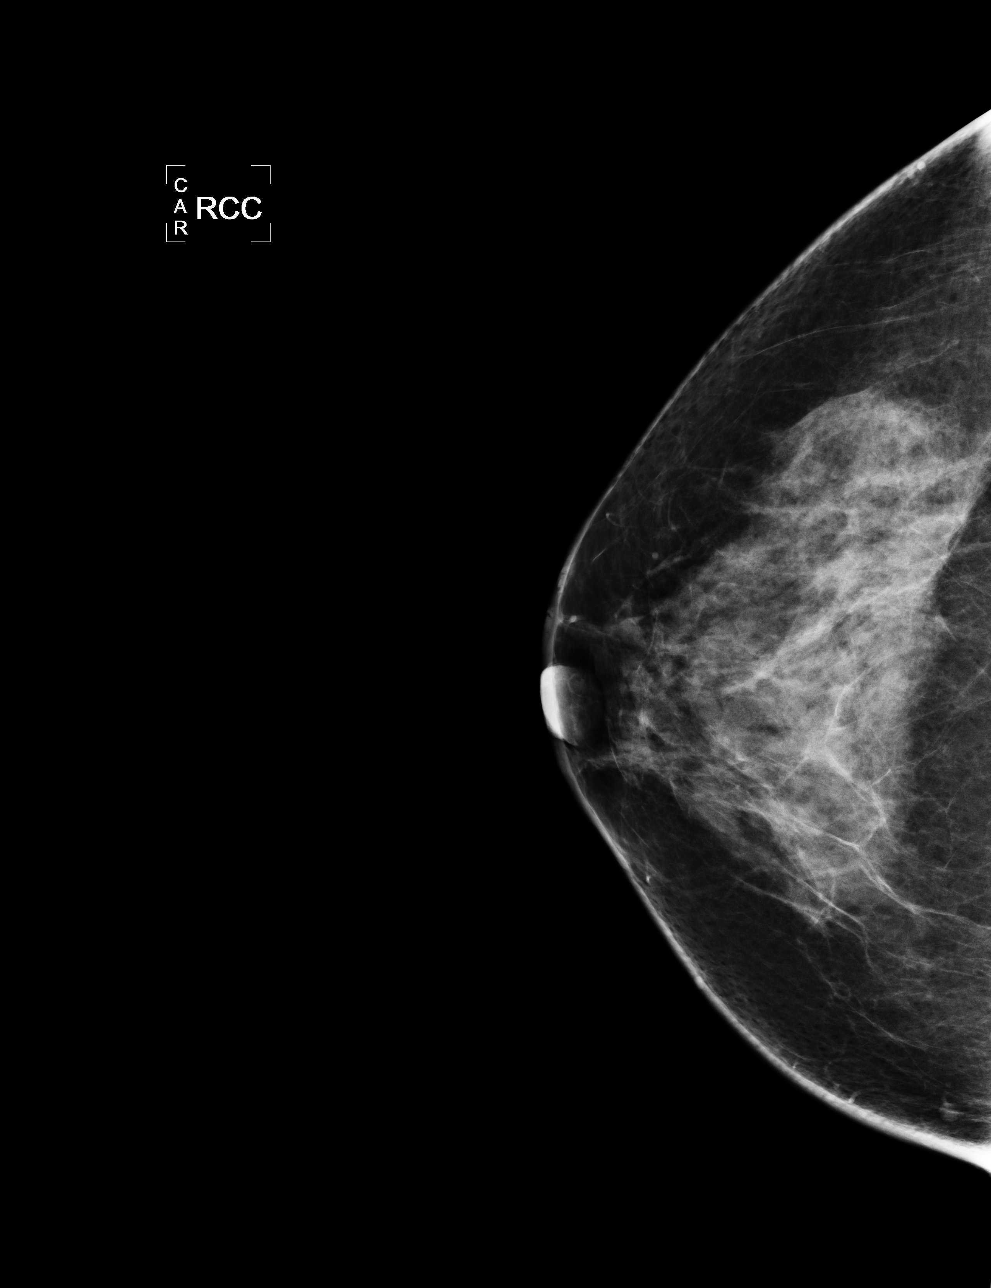

[L CC]
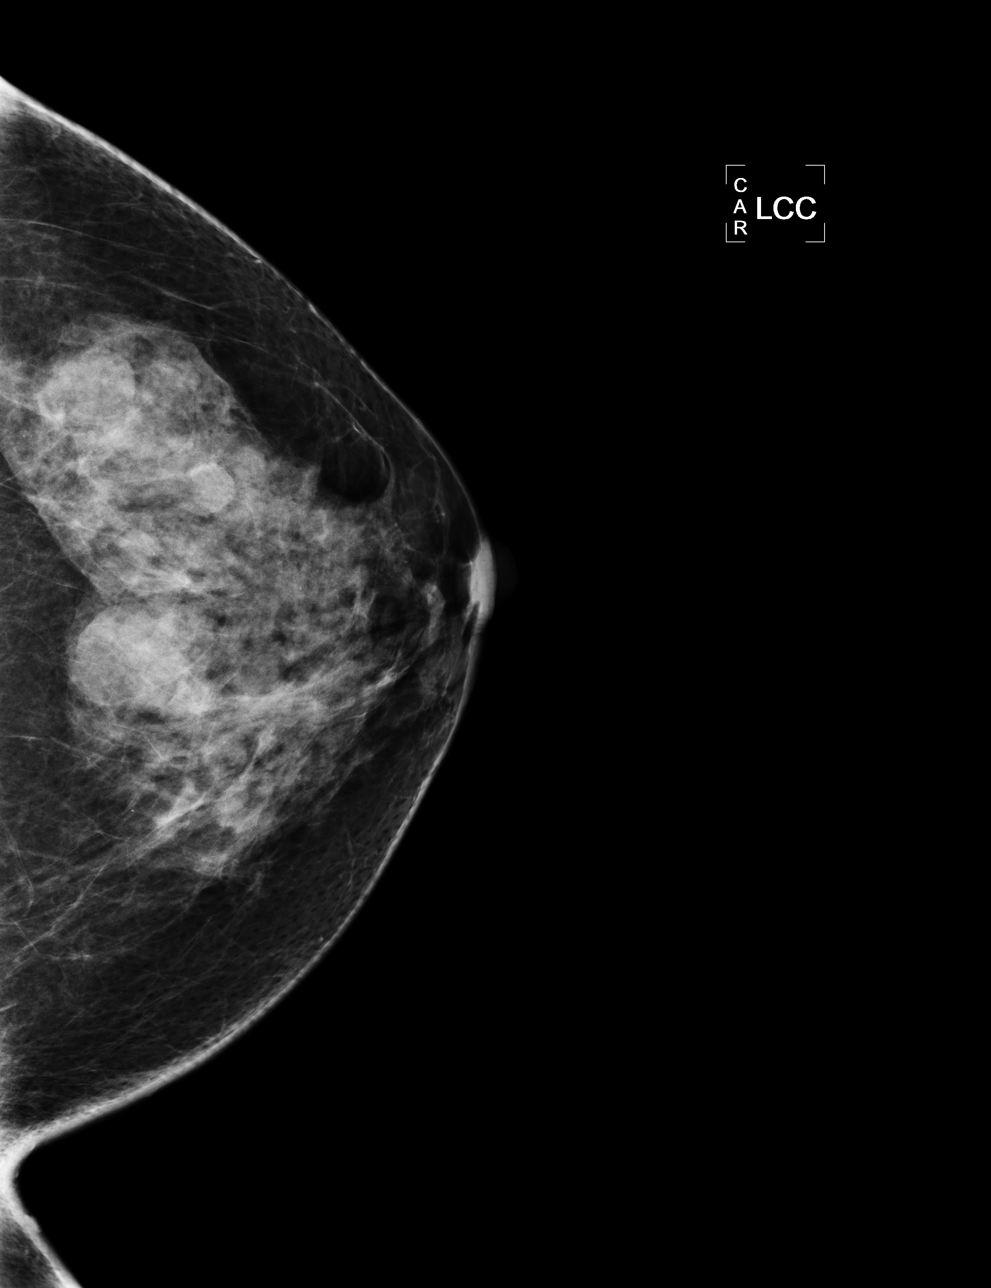

[L MLO]
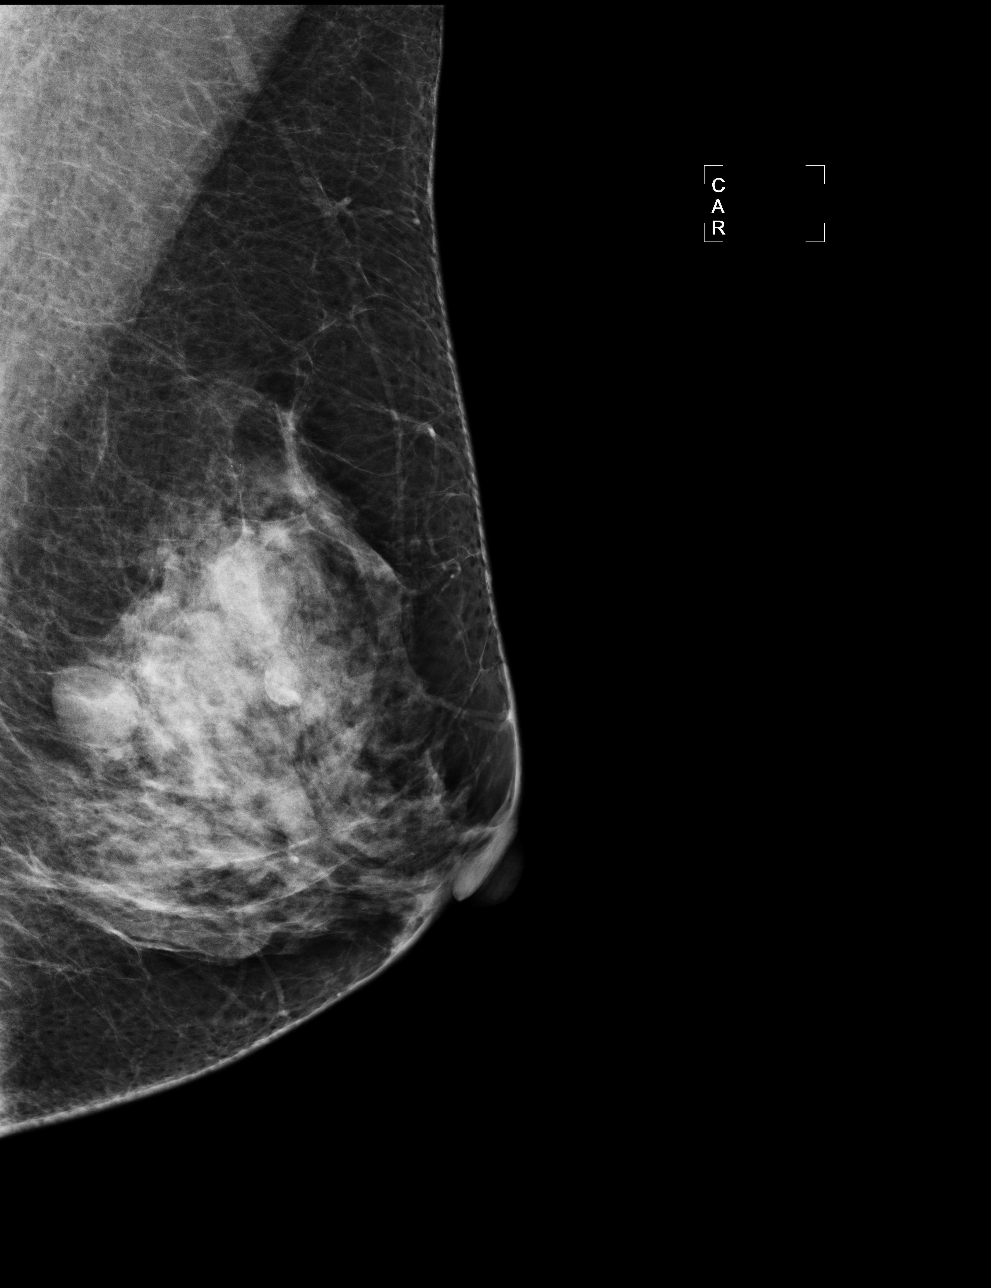

[R MLO]
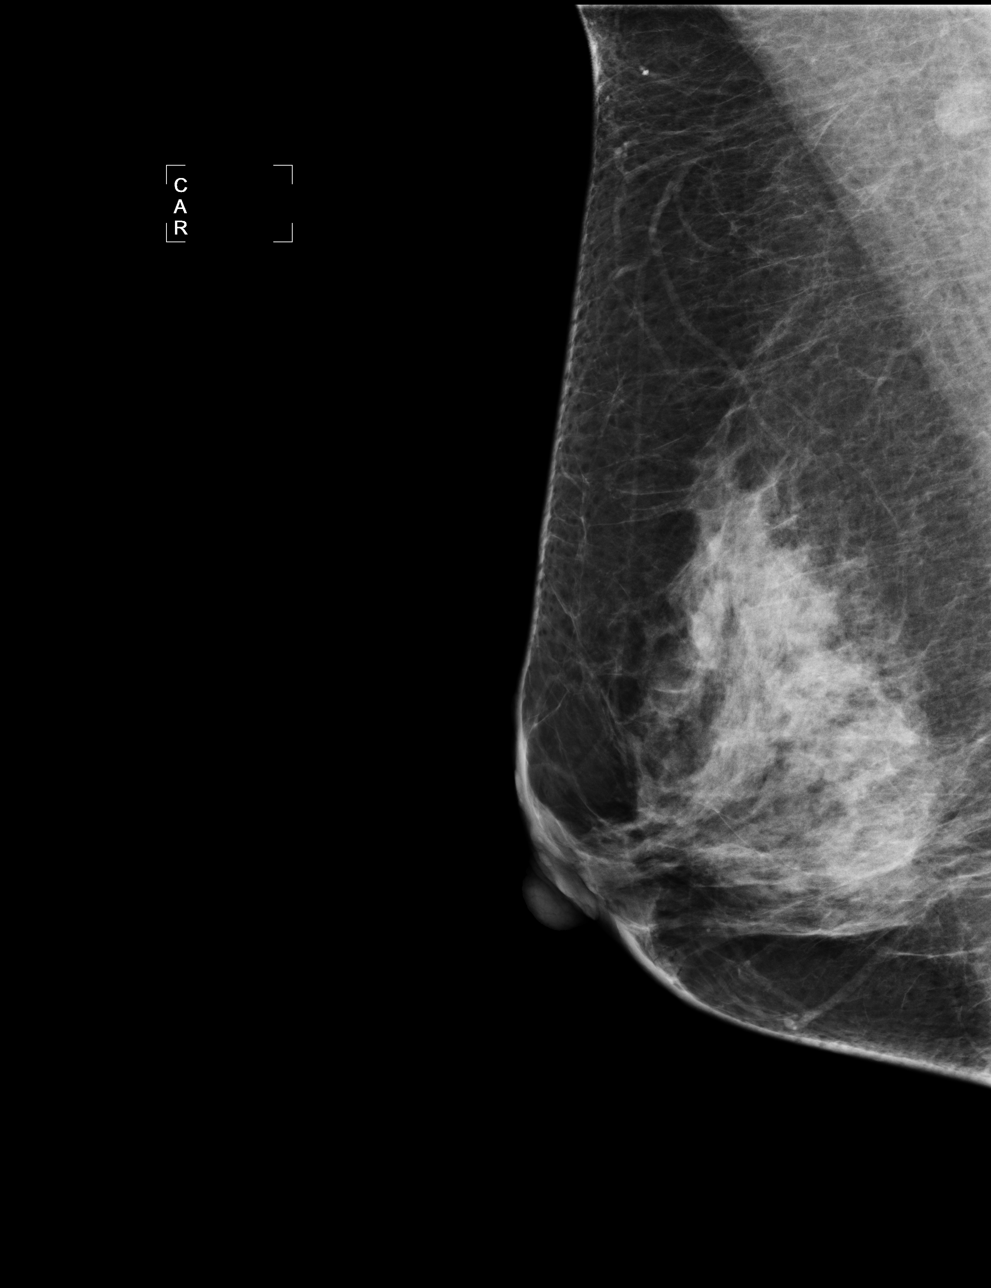

[4 of 4 positions shown; findings below may reference images not displayed]

IMPRESSION: Possible masses, left breast.  Additional evaluation is indicated.  The patient will be contacted 
for additional studies and a supplementary report will follow.  No specific mammographic evidence 
of malignancy, right breast.

ASSESSMENT: Need additional imaging evaluation and/or prior mammograms for comparison - BI-RADS 0

Further imaging of the left breast.
,

## 2011-07-21 ENCOUNTER — Encounter: Payer: Self-pay | Admitting: Anesthesiology

## 2011-07-24 ENCOUNTER — Other Ambulatory Visit: Payer: Self-pay | Admitting: Gynecology

## 2011-07-25 ENCOUNTER — Other Ambulatory Visit: Payer: Self-pay | Admitting: *Deleted

## 2011-07-25 DIAGNOSIS — N63 Unspecified lump in unspecified breast: Secondary | ICD-10-CM

## 2011-07-31 ENCOUNTER — Other Ambulatory Visit (HOSPITAL_COMMUNITY)
Admission: RE | Admit: 2011-07-31 | Discharge: 2011-07-31 | Disposition: A | Discharge status: Home / Self Care | Payer: PRIVATE HEALTH INSURANCE | Source: Ambulatory Visit | Attending: Gynecology | Admitting: Gynecology

## 2011-07-31 ENCOUNTER — Encounter: Payer: Self-pay | Admitting: Women's Health

## 2011-07-31 ENCOUNTER — Ambulatory Visit (INDEPENDENT_AMBULATORY_CARE_PROVIDER_SITE_OTHER): Payer: PRIVATE HEALTH INSURANCE | Admitting: Women's Health

## 2011-07-31 VITALS — BP 110/70 | Ht 66.25 in | Wt 152.0 lb

## 2011-07-31 DIAGNOSIS — N951 Menopausal and female climacteric states: Secondary | ICD-10-CM

## 2011-07-31 DIAGNOSIS — Z01419 Encounter for gynecological examination (general) (routine) without abnormal findings: Secondary | ICD-10-CM

## 2011-07-31 DIAGNOSIS — Z833 Family history of diabetes mellitus: Secondary | ICD-10-CM

## 2011-07-31 DIAGNOSIS — Z780 Asymptomatic menopausal state: Secondary | ICD-10-CM

## 2011-07-31 DIAGNOSIS — M899 Disorder of bone, unspecified: Secondary | ICD-10-CM

## 2011-07-31 DIAGNOSIS — M8580 Other specified disorders of bone density and structure, unspecified site: Secondary | ICD-10-CM

## 2011-07-31 LAB — VITAMIN D 25 HYDROXY (VIT D DEFICIENCY, FRACTURES): Vit D, 25-Hydroxy: 44 ng/mL (ref 30–89)

## 2011-07-31 MED ORDER — ESTRADIOL 0.05 MG/24HR TD PTWK: 1.0000 | MEDICATED_PATCH | TRANSDERMAL | Status: DC

## 2011-07-31 NOTE — Progress Notes (Signed)
SHALANA JARDIN Nov 17, 1960 [id number]    History:    The patient presents for annual exam.  States is having numerous hot flushes, poor sleep.   Past medical history, past surgical history, family history and social history were all reviewed and documented in the EPIC chart.   ROS:  A  ROS was performed and pertinent positives and negatives are included in the history.  Exam:  Filed Vitals:   07/31/11 0809  BP: 110/70    General appearance:  Normal Head/Neck:  Normal, without cervical or supraclavicular adenopathy. Thyroid:  Symmetrical, normal in size, without palpable masses or nodularity. Respiratory  Effort:  Normal  Auscultation:  Clear without wheezing or rhonchi Cardiovascular  Auscultation:  Regular rate, without rubs, murmurs or gallops  Edema/varicosities:  Not grossly evident Abdominal  Soft,nontender, without masses, guarding or rebound.  Liver/spleen:  No organomegaly noted  Hernia:  None appreciated  Skin  Inspection:  Grossly normal  Palpation:  Grossly normal Neurologic/psychiatric  Orientation:  Normal with appropriate conversation.  Mood/affect:  Normal  Genitourinary    Breasts: Examined lying and sitting.     Right: Without masses, retractions, discharge or axillary adenopathy.     Left: Without masses, retractions, discharge or axillary adenopathy.   Inguinal/mons:  Normal without inguinal adenopathy  External genitalia:  Normal  BUS/Urethra/Skene's glands:  Normal  Bladder:  Normal  Vagina:  Normal, atrophic  Cervix:  absent  Uterus:  absent  Adnexa/parametria:     Rt: Without masses or tenderness.   Lt: Without masses or tenderness.  Anus and perineum: Normal  Digital rectal exam: Normal sphincter tone without palpated masses or tenderness  Assessment/Plan:  50 y.o.MWF G0  for annual exam. History of a vag hysterectomy for endometriosis and menorrhagia in 91. States has had increased hot flushes this past year with poor sleep. Options were  reviewed, WH[facility] study reviewed, ERT with risks of blood clots, strokes, breast cancer. Negative colonoscopy in 2010,  DEXA in 2011,  Osteopenia T score -1.6. History of a thyroid nodule with a negative biopsy, normal TSH. She does see Dr. Chestine Spore for followup. Numerous moles, will schedule dermatology skin check.  Postmenopausal with symptoms  Plan: ERT, Climara 0.05 patch one per week, proper use, risks reviewed, will call if no relief of symptoms, vaginal lubricants encouraged for intercourse. SBEs, annual mammogram, encouraged exercise, has had a 4 pound weight gain from last year. Reviewed importance of cutting calories increasing activity for weight maintenance and bone health. Calcium rich diet encouraged, vitamin D 2000 daily encouraged. Will check a CBC, glucose, vitamin D, UA and Pap had a normal lipid profile last year will come fasting next year for lipid profile.   Harrington Challenger [facility], 8:38 AM 07/31/2011

## 2011-08-08 ENCOUNTER — Ambulatory Visit
Admission: RE | Admit: 2011-08-08 | Discharge: 2011-08-08 | Disposition: A | Discharge status: Home / Self Care | Payer: PRIVATE HEALTH INSURANCE | Source: Ambulatory Visit | Attending: Gynecology | Admitting: Gynecology

## 2011-08-08 ENCOUNTER — Other Ambulatory Visit: Payer: Self-pay | Admitting: Gynecology

## 2011-08-08 DIAGNOSIS — N63 Unspecified lump in unspecified breast: Secondary | ICD-10-CM

## 2011-08-08 IMAGING — MG MM DIGITAL DIAGNOSTIC LIMITED*L*
2 series · 2 of 2 positions shown · non-contrast
Comparison: Multiple priors most recently [DATE]

CLINICAL DATA: Abnormal screening, left breast

DIGITAL DIAGNOSTIC LEFT MAMMOGRAM WITHOUT CAD AND LEFT BREAST
ULTRASOUND:

[L CC]
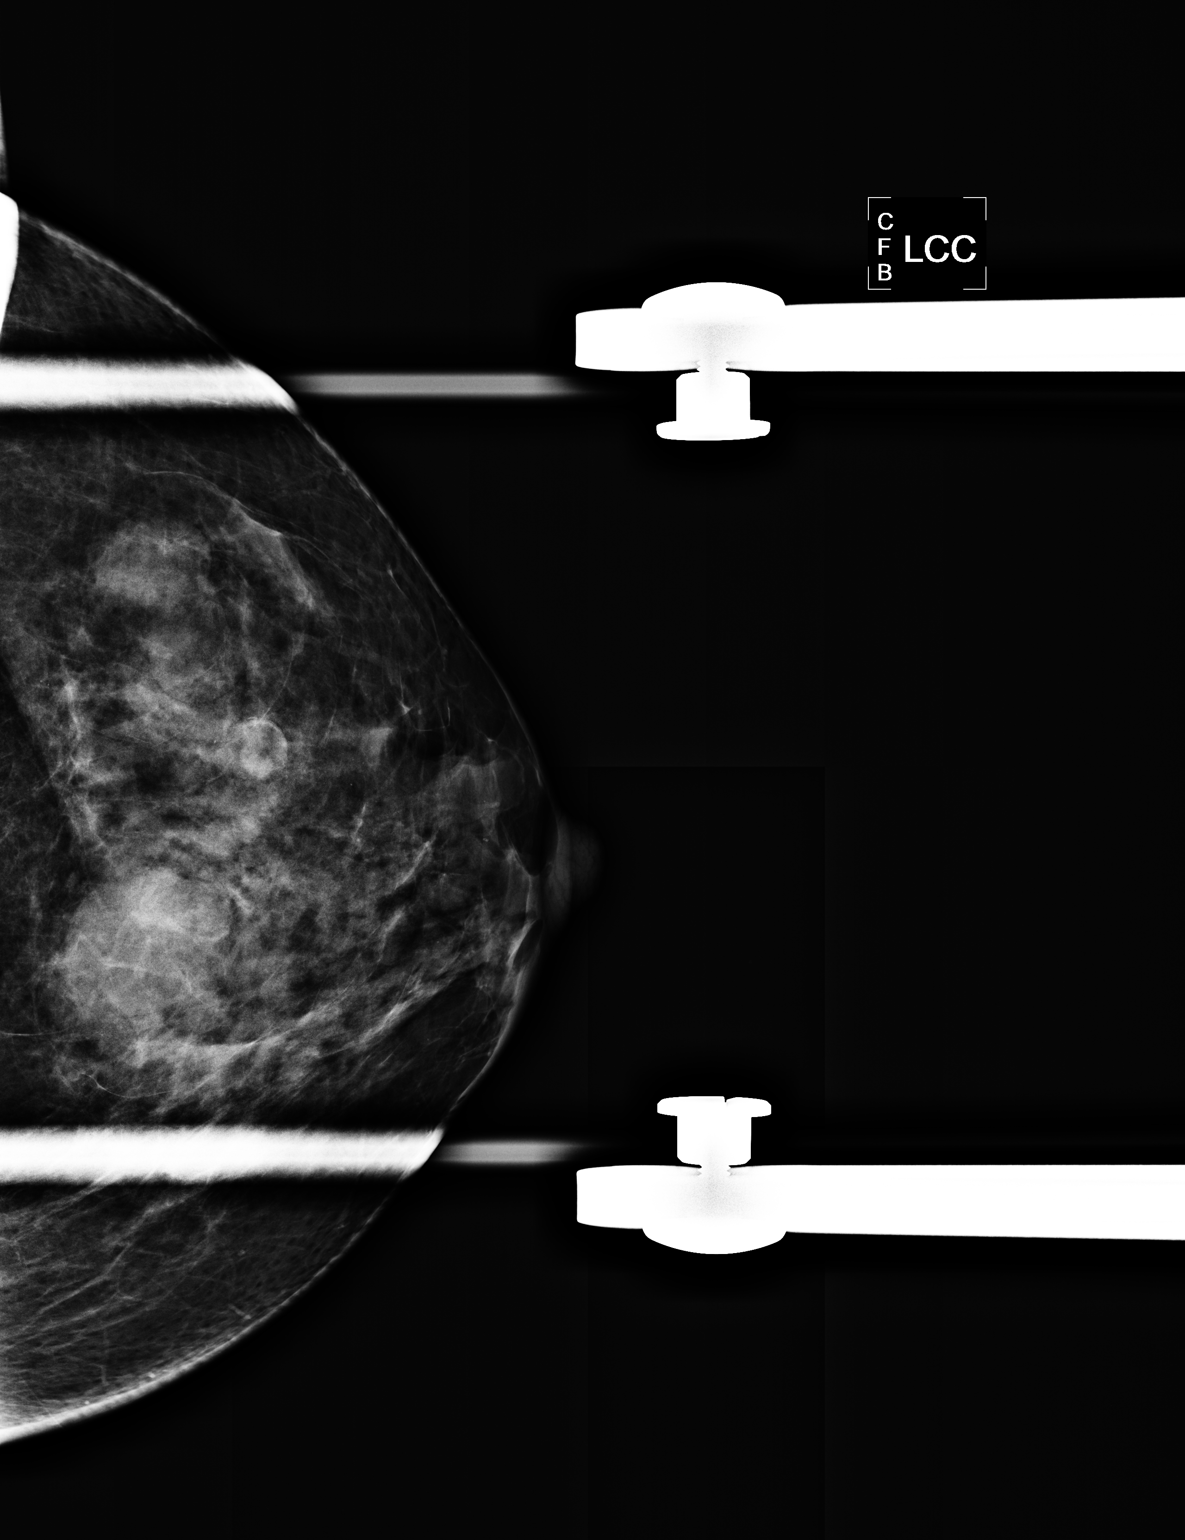

[L MLO]
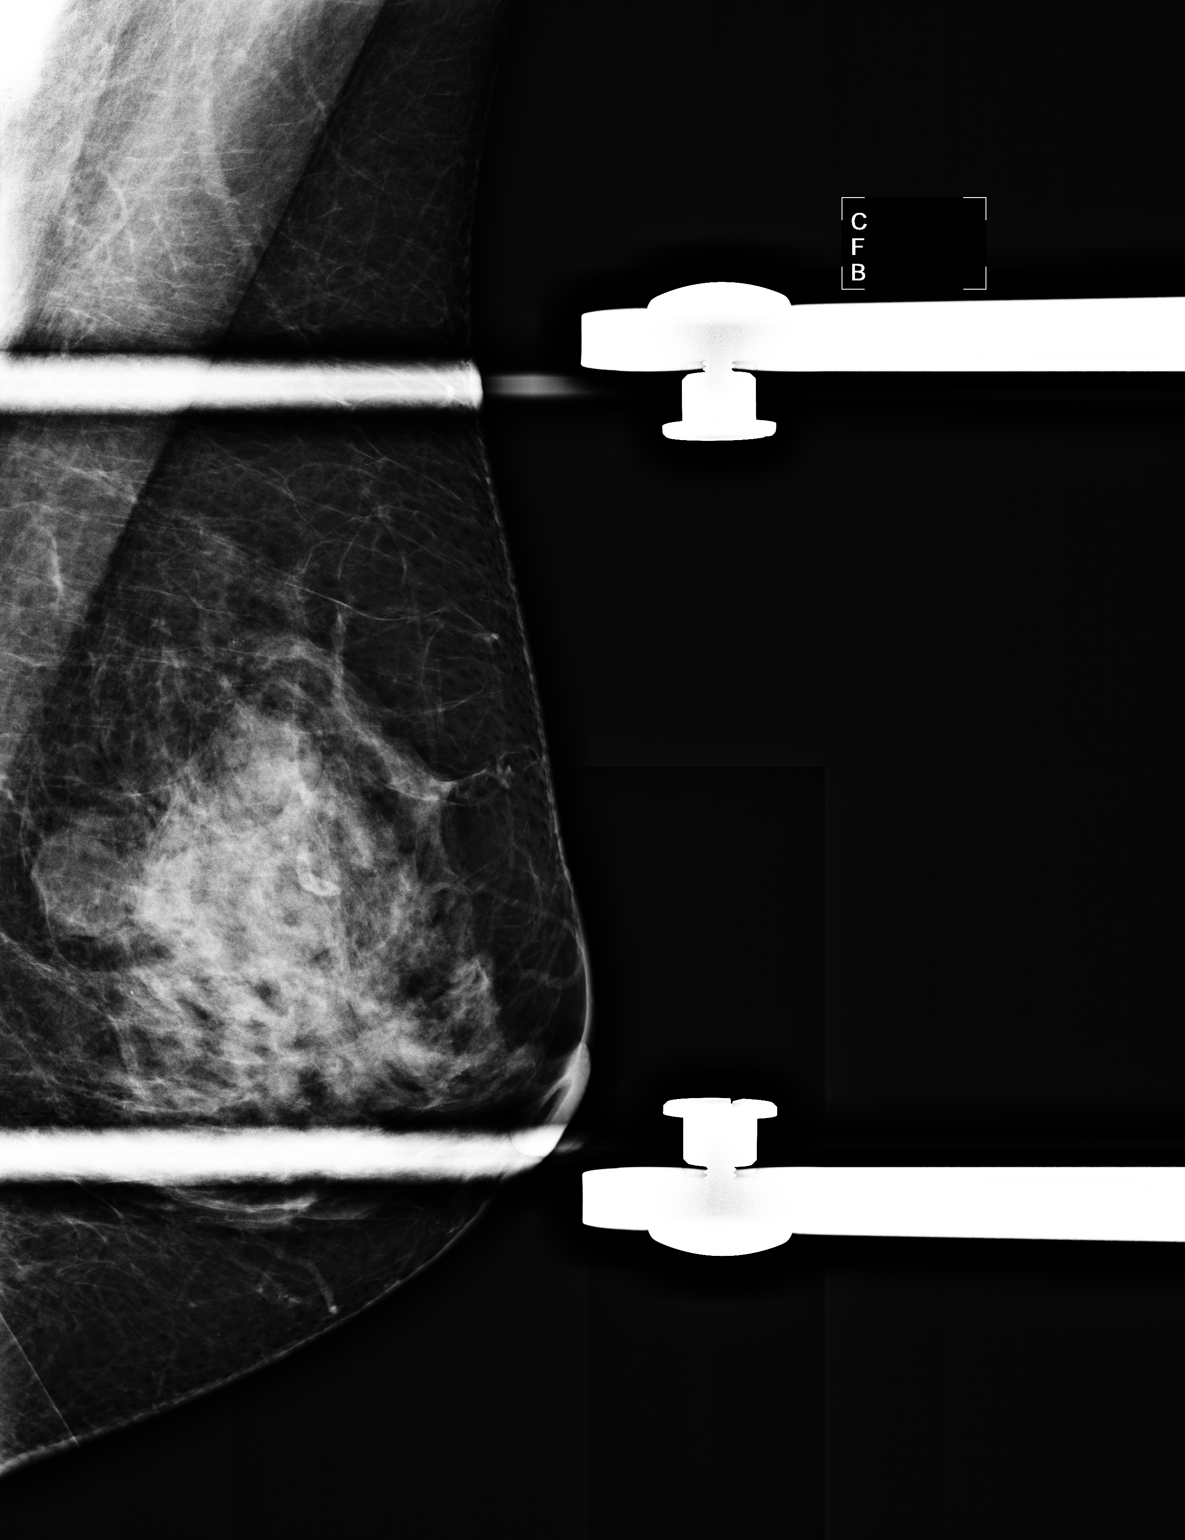

[2 of 2 positions shown; findings below may reference images not displayed]

FINDINGS: Spot compression views in the upper-outer quadrant of
the left breast confirm the presence of a three round, iso- to  low
density masses with circumscribed margins at 12 o'clock,
posteriorly, 2 o'clock posteriorly, and middle third.

On physical exam, I palpate a firm, mobile mass in the 12 o'clock
position, 2 cm the nipple.  No other mass is palpated in the upper-
outer quadrant.

Ultrasound is performed, showing cysts in the 12 o'clock position,
2 cm from the nipple and in the 2 o'clock position, 3 cm the nipple
measuring 2.0 x 1.4 x 1.9 cm and 1.5 x 1.0 x 1.3 cm respectively.
These correspond to the two larger mass is seen mammographically.
A hypoechoic mass with posterior acoustic enhancement and
circumscribed margins is imaged in the 2 o'clock position, 2 cm the
nipple measuring 1.1 x 0.8 x 0.8 cm.  This is likely a mildly
complicated cyst. However, a short-term interval follow-up is
recommended to ensure stability of this area as it is not totally
anechoic.
IMPRESSION: Probably benign findings, left breast detailed above.  A 6-month
follow-up is recommended.

BI-RADS CATEGORY 3:  Probably benign finding(s) - short interval
follow-up suggested.

## 2011-08-08 IMAGING — US US BREAST LEFT
1 series · 12 of 12 positions shown · non-contrast
Comparison: Multiple priors most recently [DATE]

CLINICAL DATA: Abnormal screening, left breast

DIGITAL DIAGNOSTIC LEFT MAMMOGRAM WITHOUT CAD AND LEFT BREAST
ULTRASOUND:

[Series 1: us breast left · 12 of 12 slices shown]
[im 1/12]
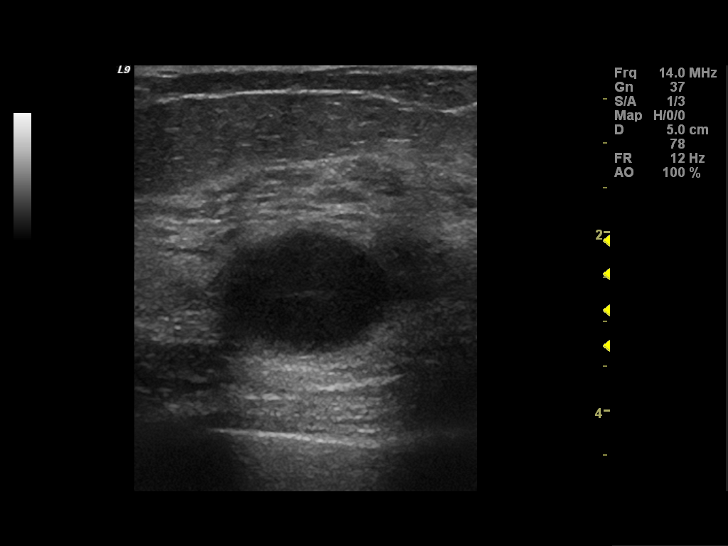
[im 2/12]
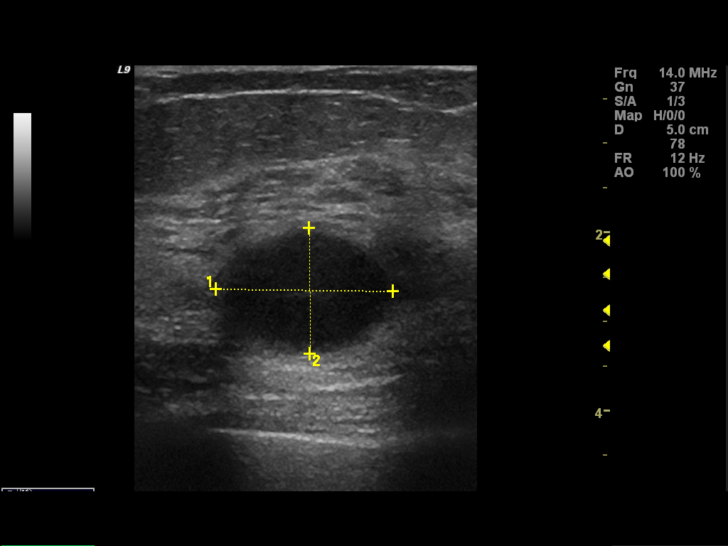
[im 3/12]
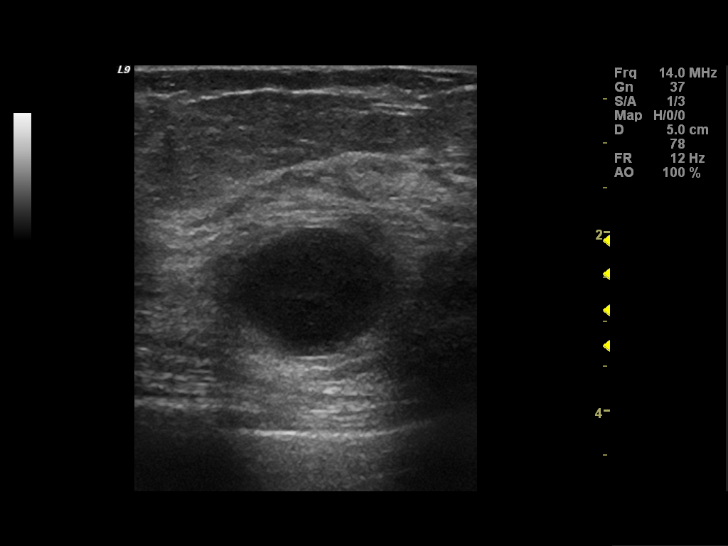
[im 4/12]
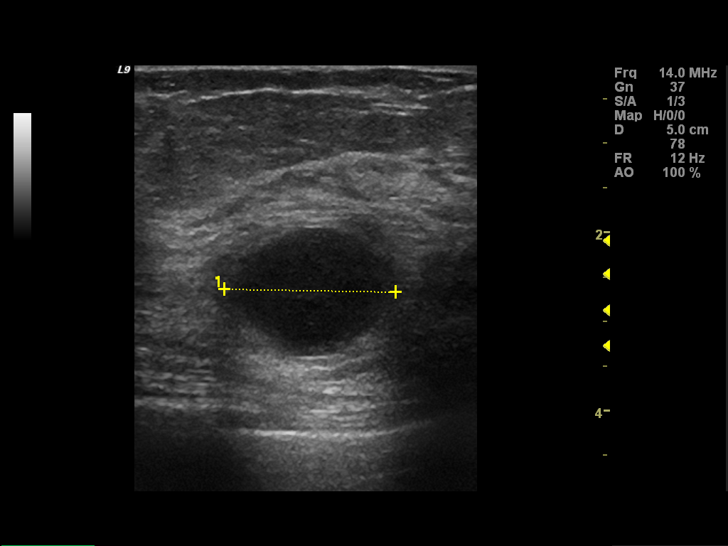
[im 5/12]
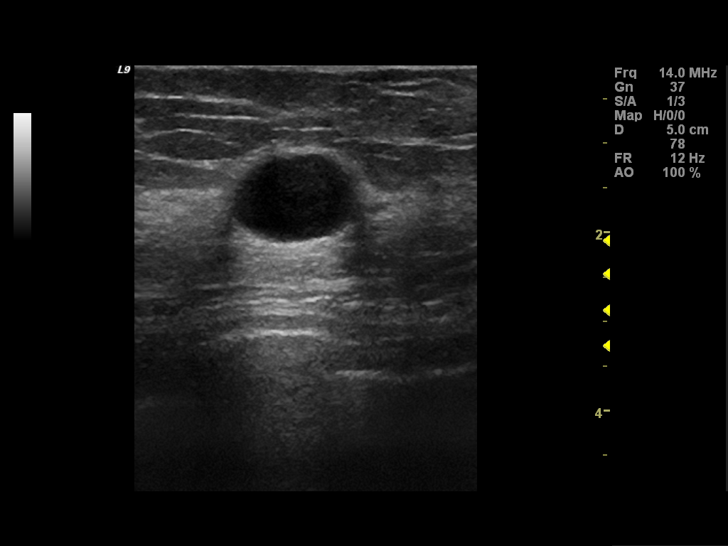
[im 6/12]
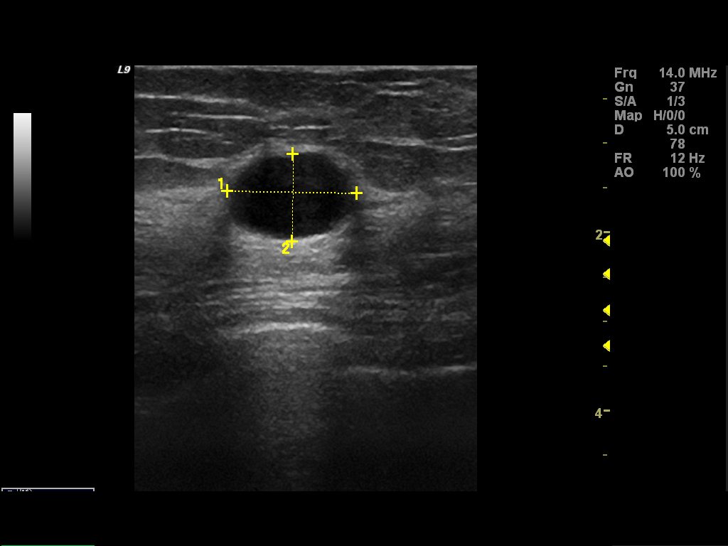
[im 7/12]
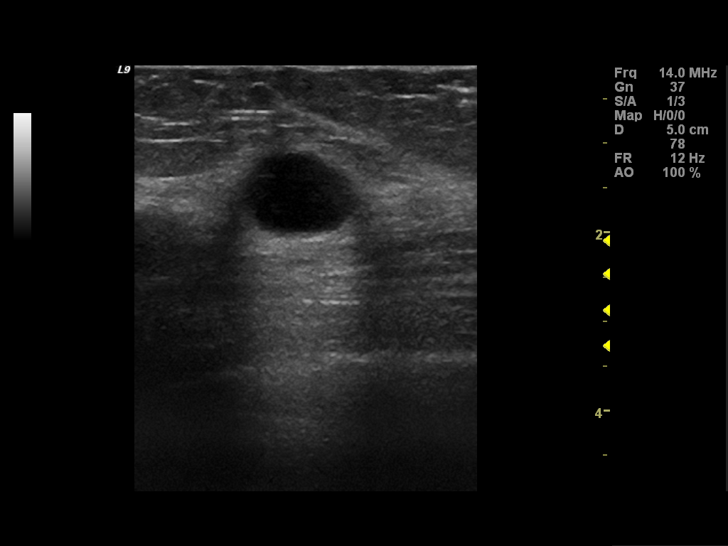
[im 8/12]
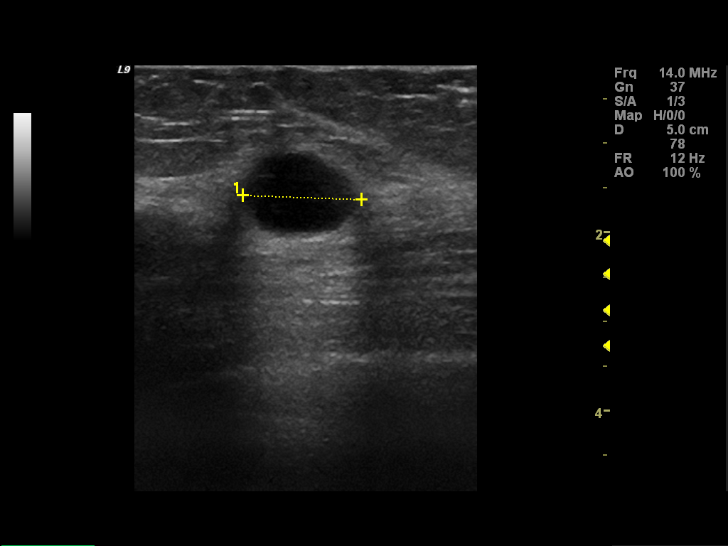
[im 9/12]
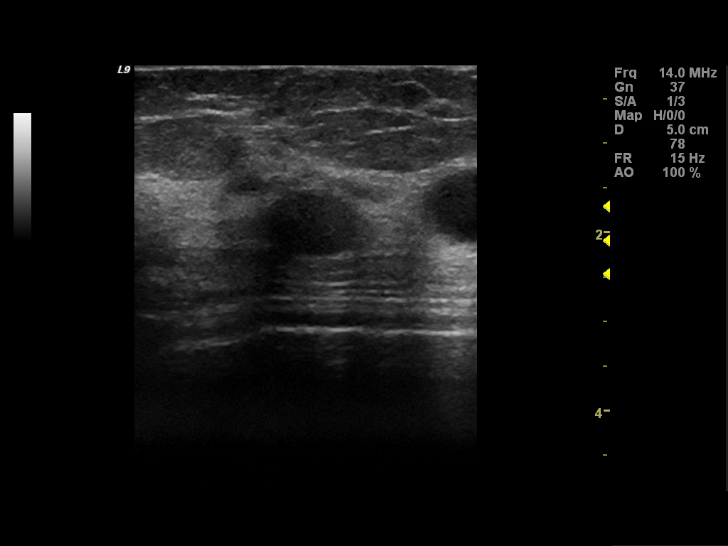
[im 10/12]
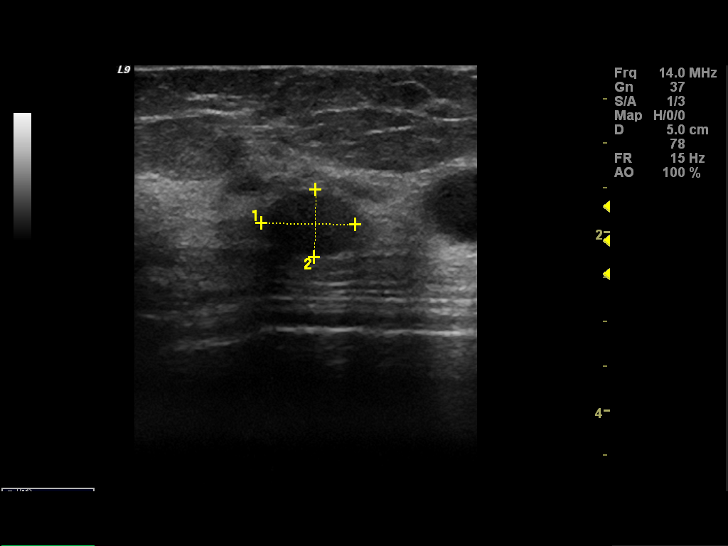
[im 11/12]
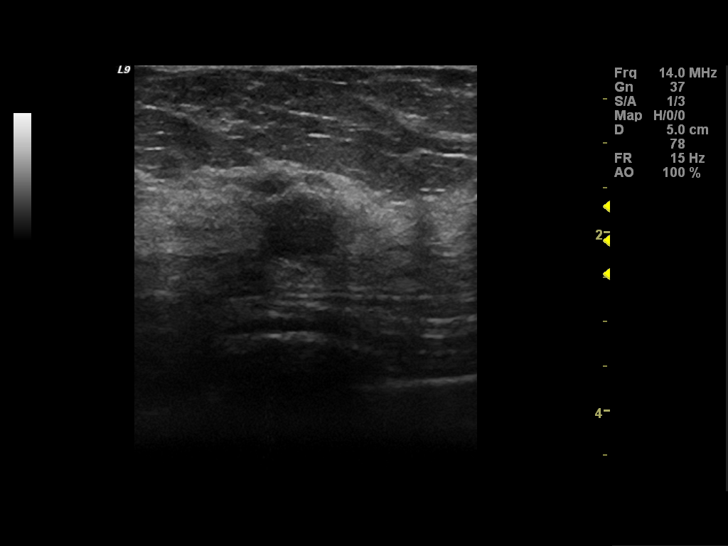
[im 12/12]
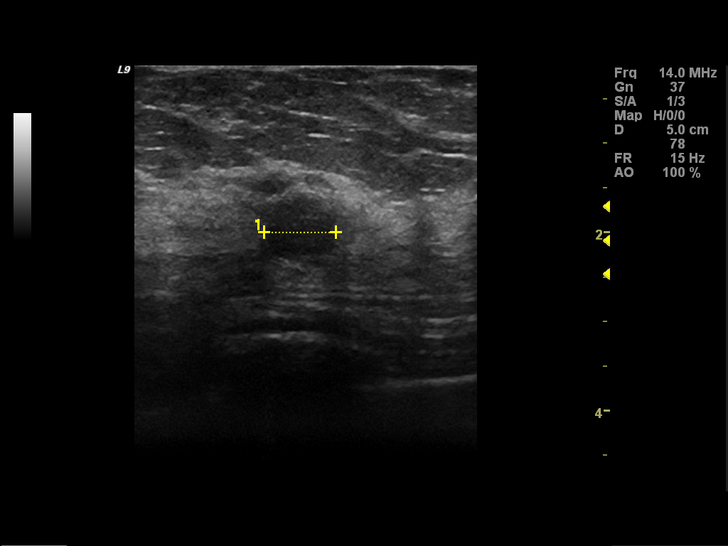

[12 of 12 positions shown; findings below may reference images not displayed]

FINDINGS: Spot compression views in the upper-outer quadrant of
the left breast confirm the presence of a three round, iso- to  low
density masses with circumscribed margins at 12 o'clock,
posteriorly, 2 o'clock posteriorly, and middle third.

On physical exam, I palpate a firm, mobile mass in the 12 o'clock
position, 2 cm the nipple.  No other mass is palpated in the upper-
outer quadrant.

Ultrasound is performed, showing cysts in the 12 o'clock position,
2 cm from the nipple and in the 2 o'clock position, 3 cm the nipple
measuring 2.0 x 1.4 x 1.9 cm and 1.5 x 1.0 x 1.3 cm respectively.
These correspond to the two larger mass is seen mammographically.
A hypoechoic mass with posterior acoustic enhancement and
circumscribed margins is imaged in the 2 o'clock position, 2 cm the
nipple measuring 1.1 x 0.8 x 0.8 cm.  This is likely a mildly
complicated cyst. However, a short-term interval follow-up is
recommended to ensure stability of this area as it is not totally
anechoic.
IMPRESSION: Probably benign findings, left breast detailed above.  A 6-month
follow-up is recommended.

BI-RADS CATEGORY 3:  Probably benign finding(s) - short interval
follow-up suggested.

## 2011-08-11 ENCOUNTER — Telehealth: Payer: Self-pay | Admitting: *Deleted

## 2011-08-11 NOTE — Telephone Encounter (Signed)
Patient had lm requesting lab results, informed all normal.

## 2011-11-20 ENCOUNTER — Ambulatory Visit: Payer: BC Managed Care – PPO | Admitting: Family Medicine

## 2011-11-20 VITALS — BP 130/75 | HR 80 | Temp 97.5°F | Resp 16 | Ht 66.0 in | Wt 152.0 lb

## 2011-11-20 DIAGNOSIS — J310 Chronic rhinitis: Secondary | ICD-10-CM

## 2011-11-20 DIAGNOSIS — J309 Allergic rhinitis, unspecified: Secondary | ICD-10-CM

## 2011-11-20 DIAGNOSIS — J019 Acute sinusitis, unspecified: Secondary | ICD-10-CM

## 2011-11-20 MED ORDER — AZITHROMYCIN 250 MG PO TABS: ORAL_TABLET | ORAL | Status: AC

## 2011-11-20 NOTE — Progress Notes (Signed)
  [facility]:  Office Visit  Chief Complaint:  Chief Complaint  Patient presents with  . Headache    since last night  . Sinus Problem    HPI: Sharon Kirk is a 51 y.o. female who complains of  Sinus headaches x 2 days, congestion runny nose, dry cough, ear pressure. No fevers, chills. + Sick contacts at work. Tried Mucinex Dm, Zytac and Ibuprofen without releif. + HA. No h/o allergies or asthma. Non smoker.   Past Medical History  Diagnosis Date  . Hypothyroid 06/2010    THYROID NODULES, NEG BX 2009  . IBS (irritable bowel syndrome)   . Osteopenia     FEMORAL NECK   Past Surgical History  Procedure Date  . Abdominal hysterectomy 1991  . Dilation and curettage of uterus 1988   History   Social History  . Marital Status: Married    Spouse Name: N/A    Number of Children: N/A  . Years of Education: N/A   Social History Main Topics  . Smoking status: Never Smoker   . Smokeless tobacco: Never Used  . Alcohol Use: No  . Drug Use: No  . Sexually Active: No   Other Topics Concern  . None   Social History Narrative  . None   Family History  Problem Relation Age of Onset  . Diabetes Brother   . Hypertension Brother   . Cancer Maternal Grandmother     COLON  . Cancer Paternal Grandfather     PROSTATE  . Hypertension Mother   . Diabetes Brother    Allergies  Allergen Reactions  . Codeine     ROS: The patient denies fevers, chills, night sweats, unintentional weight loss, chest pain, palpitations, wheezing, dyspnea on exertion, nausea, vomiting, abdominal pain, dysuria, hematuria, melena, numbness, weakness, or tingling.   All other systems have been reviewed and were otherwise negative with the exception of those mentioned in the HPI and as above.    PHYSICAL EXAM: Filed Vitals:   11/20/11 0810  BP: 130/75  Pulse: 80  Temp: 97.5 F (36.4 C)  Resp: 16   Filed Vitals:   11/20/11 0810  Height: 5\' 6"  (1.676 m)  Weight: 152 lb  (68.947 kg)   Body mass index is 24.53 kg/(m^2).  General: Alert, no acute distress HEENT:  Normocephalic, atraumatic, oropharynx patent. No exudates.Bilateral TM normal. + Sinus pressure max bilaterally. Cardiovascular:  Regular rate and rhythm, no rubs murmurs or gallops.  No Carotid bruits, radial pulse intact. No pedal edema.  Respiratory: Clear to auscultation bilaterally.  No wheezes, rales, or rhonchi.  No cyanosis, no use of accessory musculature GI: No organomegaly, abdomen is soft and non-tender, positive bowel sounds.  No masses. Skin: No rashes. Neurologic: Facial musculature symmetric. Psychiatric: Patient is appropriate throughout our interaction. Lymphatic: No axillary or cervical lymphadenopathy Musculoskeletal: Gait intact.     ASSESSMENT/PLAN: Encounter Diagnoses  Name Primary?  . Sinusitis acute Yes  . Rhinitis     1. Patient given Z-pack and sxs treatement. Asked to take Z-pack after 3 days of trying sx treatment if no improvement.  F/u prn   Anelis Hrivnak PHUONG, DO 11/20/2011 8:32 AM

## 2011-11-20 NOTE — Patient Instructions (Signed)

## 2011-11-20 NOTE — Progress Notes (Deleted)
  Subjective:    Patient ID: Sharon Kirk, female    DOB: 1961-01-13, 51 y.o.   MRN: [id number]  HPI    Review of Systems     Objective:   Physical Exam        Assessment & Plan:

## 2011-12-25 ENCOUNTER — Other Ambulatory Visit: Payer: Self-pay | Admitting: Dermatology

## 2012-01-24 ENCOUNTER — Telehealth: Payer: Self-pay | Admitting: *Deleted

## 2012-01-24 DIAGNOSIS — Z780 Asymptomatic menopausal state: Secondary | ICD-10-CM

## 2012-01-24 MED ORDER — ESTRADIOL 0.05 MG/24HR TD PTWK: 1.0000 | MEDICATED_PATCH | TRANSDERMAL | Status: DC

## 2012-01-24 NOTE — Telephone Encounter (Signed)
Pt called requesting 90 day supply of her estradiol 0.5 mg patches to [facility]. rx sent to pharmacy, pt informed with this as well.

## 2012-02-20 ENCOUNTER — Other Ambulatory Visit: Payer: Self-pay | Admitting: Gynecology

## 2012-02-20 DIAGNOSIS — N6002 Solitary cyst of left breast: Secondary | ICD-10-CM

## 2012-02-28 ENCOUNTER — Other Ambulatory Visit: Payer: Self-pay | Admitting: *Deleted

## 2012-02-28 DIAGNOSIS — IMO0001 Reserved for inherently not codable concepts without codable children: Secondary | ICD-10-CM

## 2012-02-28 DIAGNOSIS — N6002 Solitary cyst of left breast: Secondary | ICD-10-CM

## 2012-02-29 ENCOUNTER — Other Ambulatory Visit: Payer: Self-pay

## 2012-02-29 ENCOUNTER — Ambulatory Visit
Admission: RE | Admit: 2012-02-29 | Discharge: 2012-02-29 | Disposition: A | Discharge status: Home / Self Care | Payer: BC Managed Care – PPO | Source: Ambulatory Visit | Attending: Gynecology | Admitting: Gynecology

## 2012-02-29 DIAGNOSIS — N6002 Solitary cyst of left breast: Secondary | ICD-10-CM

## 2012-02-29 DIAGNOSIS — IMO0001 Reserved for inherently not codable concepts without codable children: Secondary | ICD-10-CM

## 2012-02-29 IMAGING — MG MM DIGITAL DIAGNOSTIC UNILAT*L*
2 series · 2 of 2 positions shown · non-contrast
Comparison: [DATE] and [DATE] as well as [DATE] and
[DATE].

CLINICAL DATA: Patient presents for a 6-month follow-up diagnostic
left breast mammogram and ultrasound to evaluate a mass at the 2
o'clock position.

DIGITAL DIAGNOSTIC LEFT MAMMOGRAM WITH CAD AND LEFT BREAST
ULTRASOUND:

[L CC]
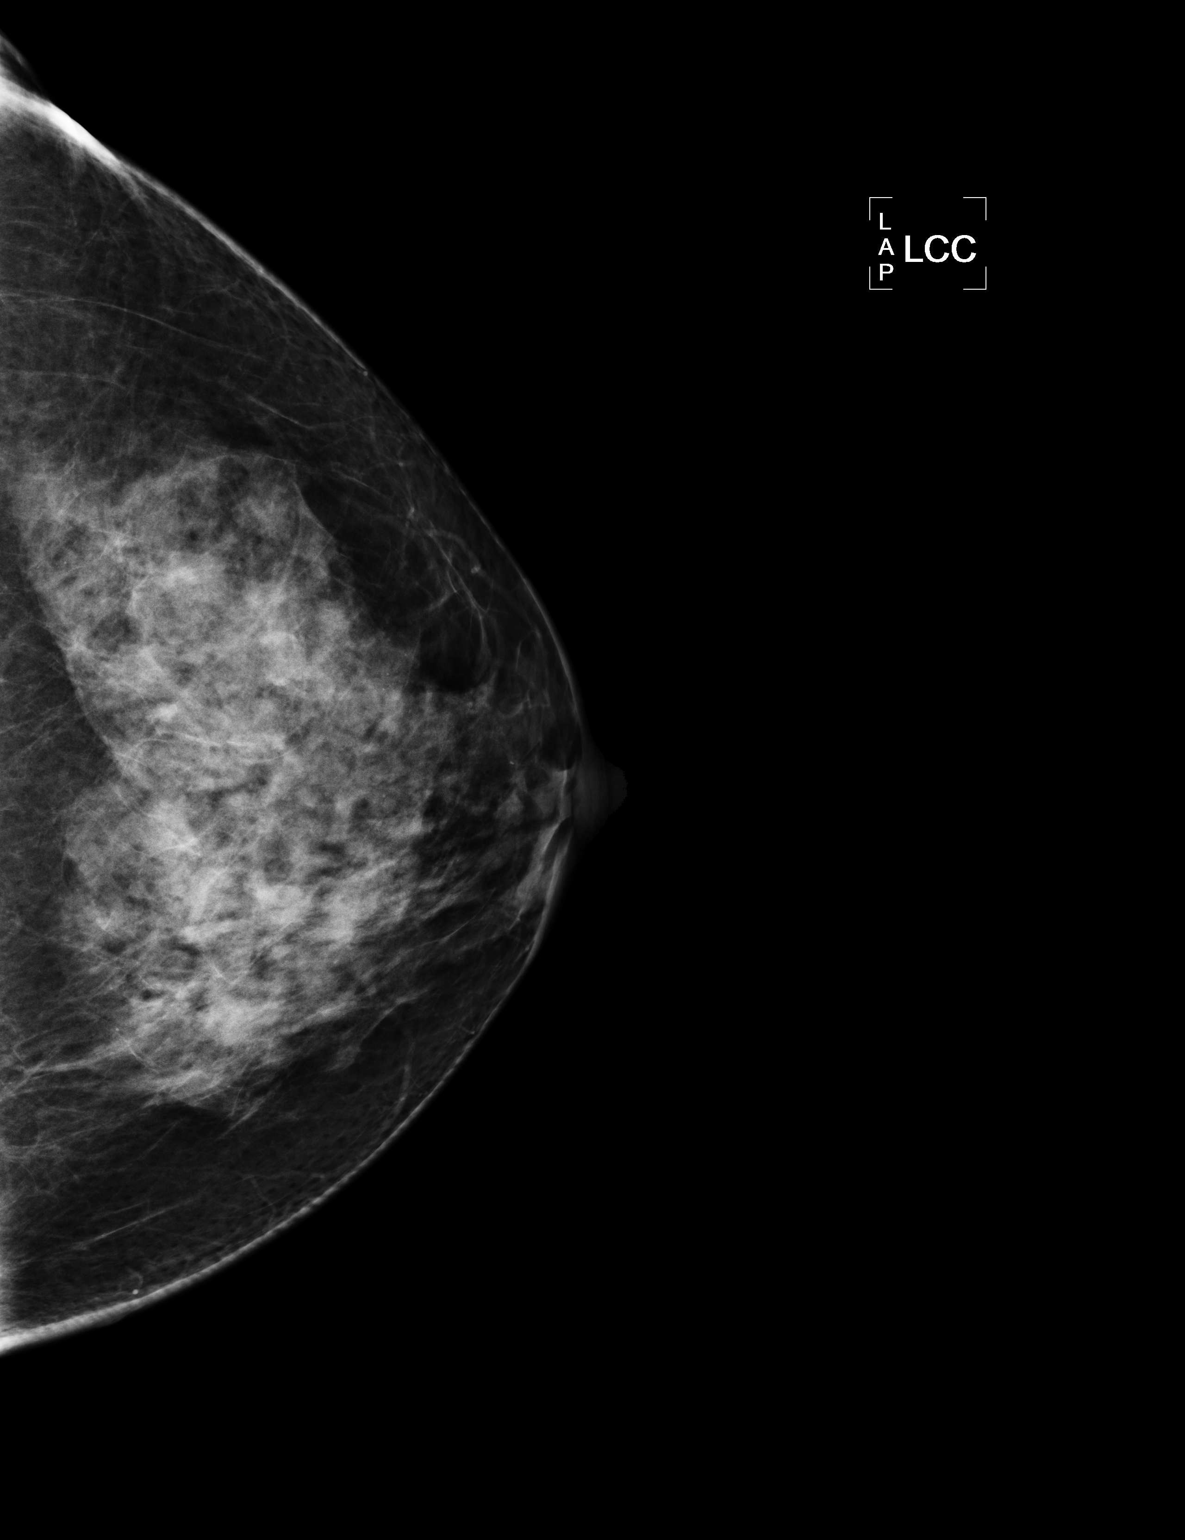

[L MLO]
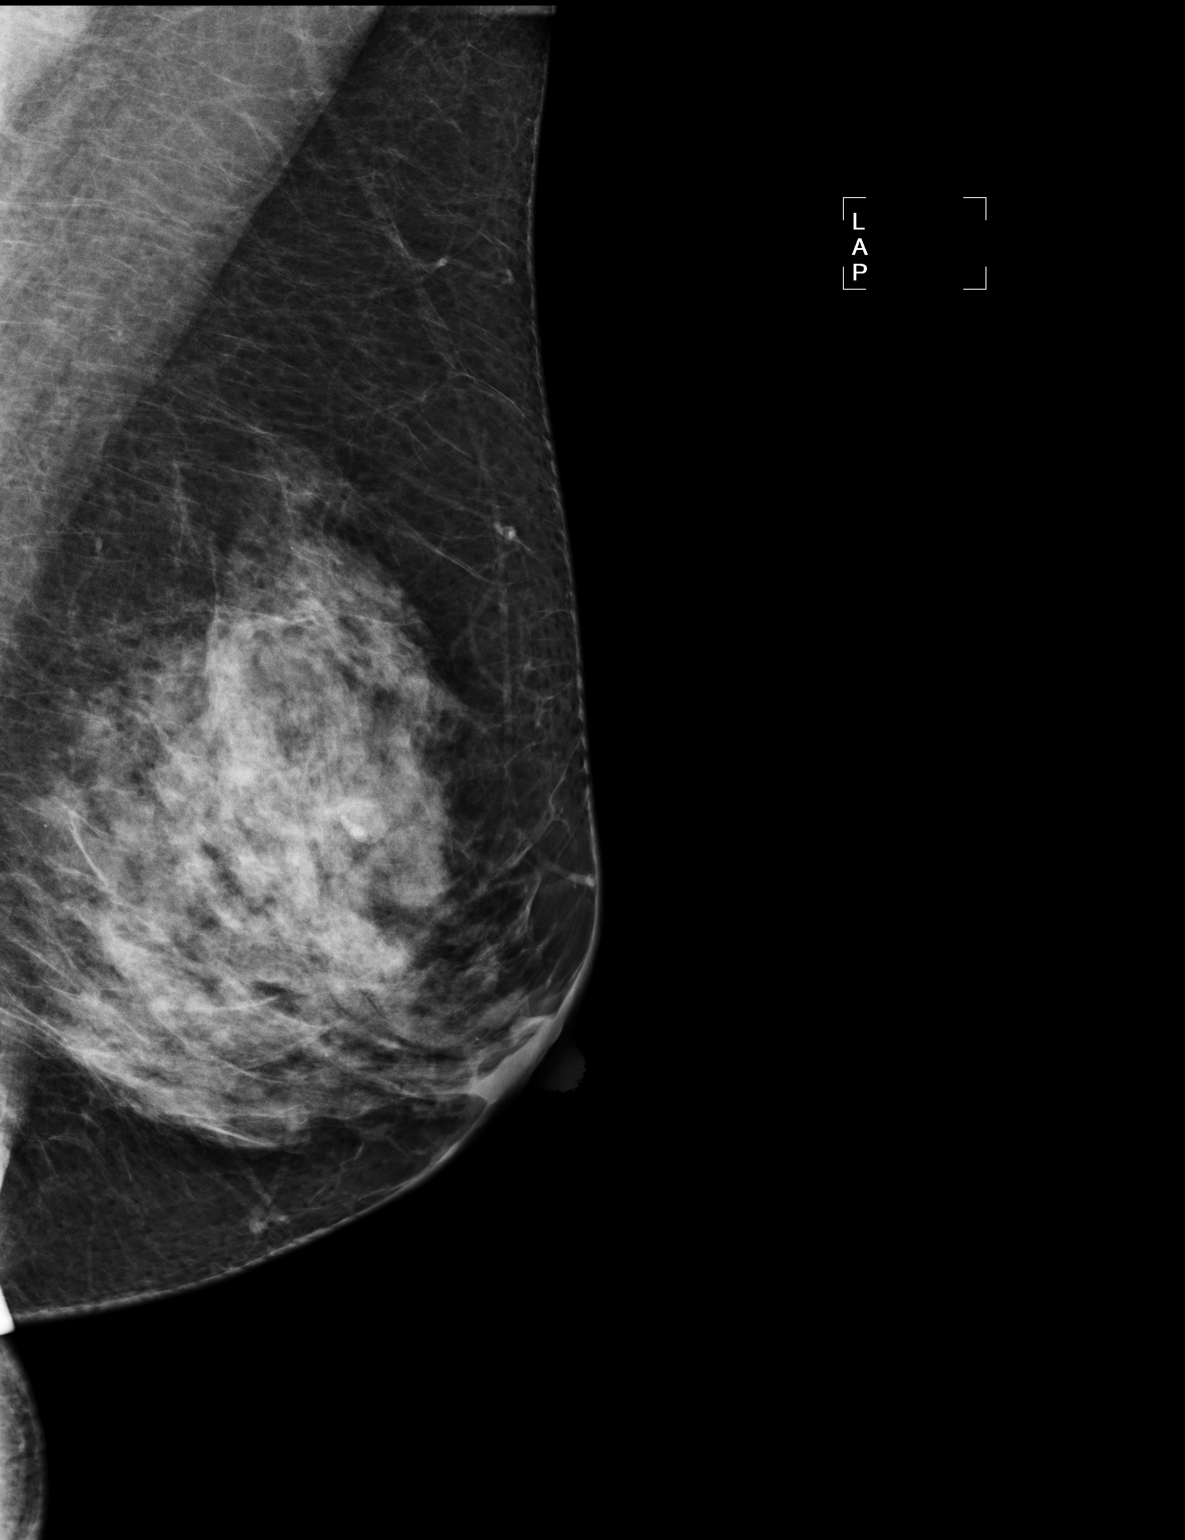

[2 of 2 positions shown; findings below may reference images not displayed]

FINDINGS: Exam demonstrates heterogeneously dense fibroglandular
tissue.  The previously seen cysts at the 12 and 2 o'clock position
have resolved.  The previously seen small mass at the 2 o'clock
position is unchanged to slightly smaller.  Remainder of the exam
is unchanged.
Mammographic images were processed with CAD.

Ultrasound is performed, showing continued evidence of a well-
defined rounded hypoechoic mass with mild increased through
transmission at the 2 o'clock position 2 cm from the nipple.  This
is slightly smaller measuring 7 x 7 x 9 mm and likely represents a
complicated cyst.
IMPRESSION: 7 x 7 x 9 mm probable complicated cyst at the 2 o'clock position of
the left breast 2 cm from the nipple unchanged to slightly smaller.

Recommendations:  Recommend an additional 6-month follow-up
diagnostic left breast mammogram and ultrasound as this will
coincide with the patient's annual mammogram in [DATE].

BI-RADS CATEGORY 3:  Probably benign finding(s) - short interval
follow-up suggested.

## 2012-02-29 IMAGING — US US BREAST*L*
1 series · 4 of 4 positions shown · non-contrast
Comparison: [DATE] and [DATE] as well as [DATE] and
[DATE].

CLINICAL DATA: Patient presents for a 6-month follow-up diagnostic
left breast mammogram and ultrasound to evaluate a mass at the 2
o'clock position.

DIGITAL DIAGNOSTIC LEFT MAMMOGRAM WITH CAD AND LEFT BREAST
ULTRASOUND:

[Series 1: us breast*left* · 4 of 4 slices shown]
[im 1/4]
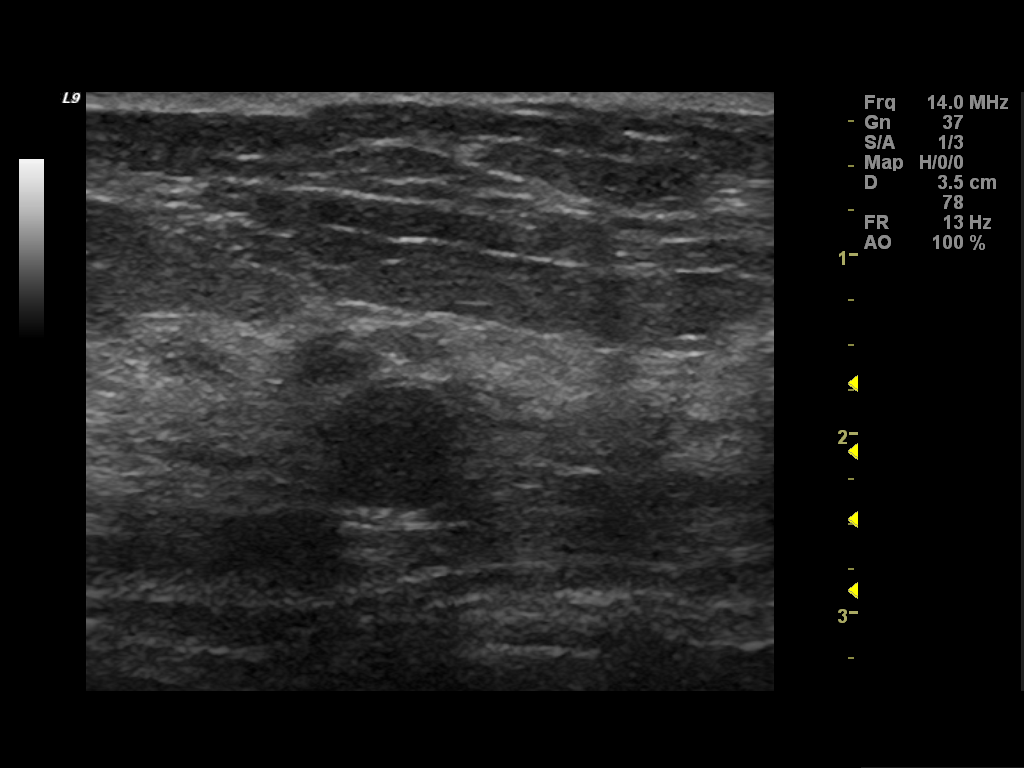
[im 2/4]
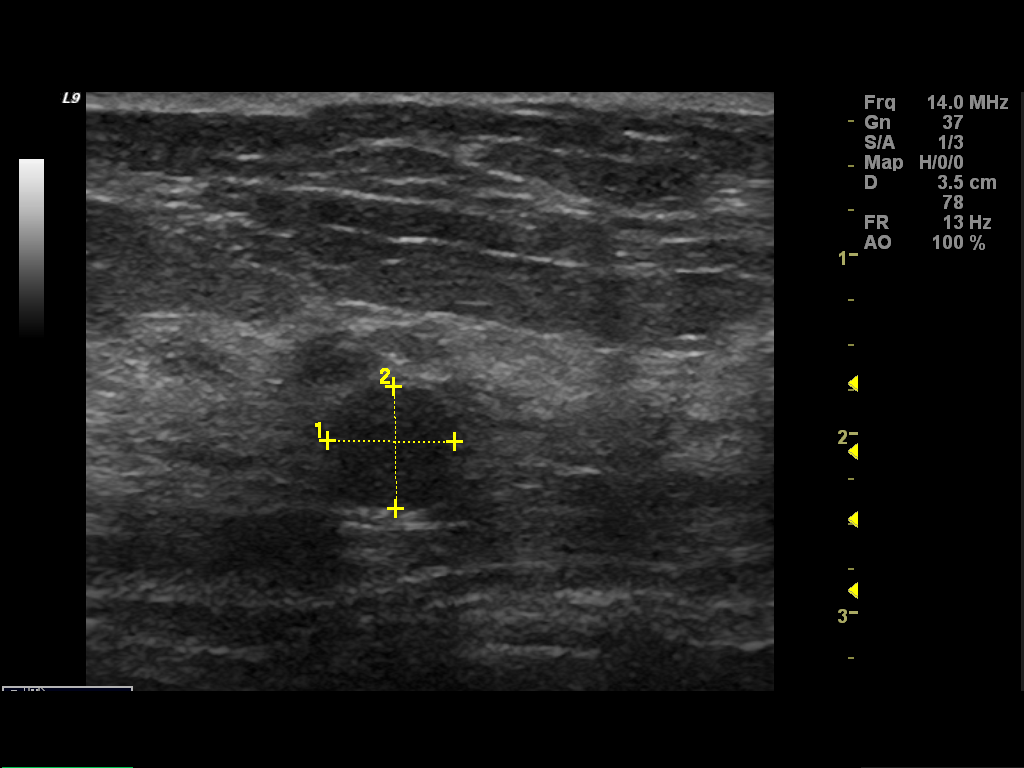
[im 3/4]
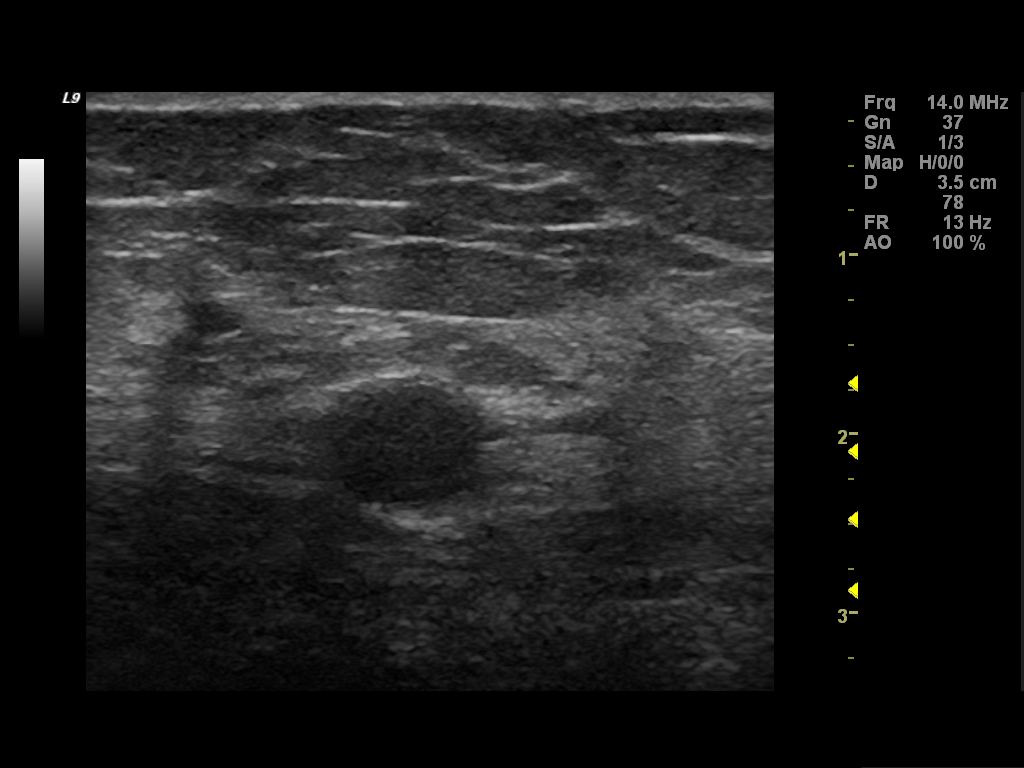
[im 4/4]
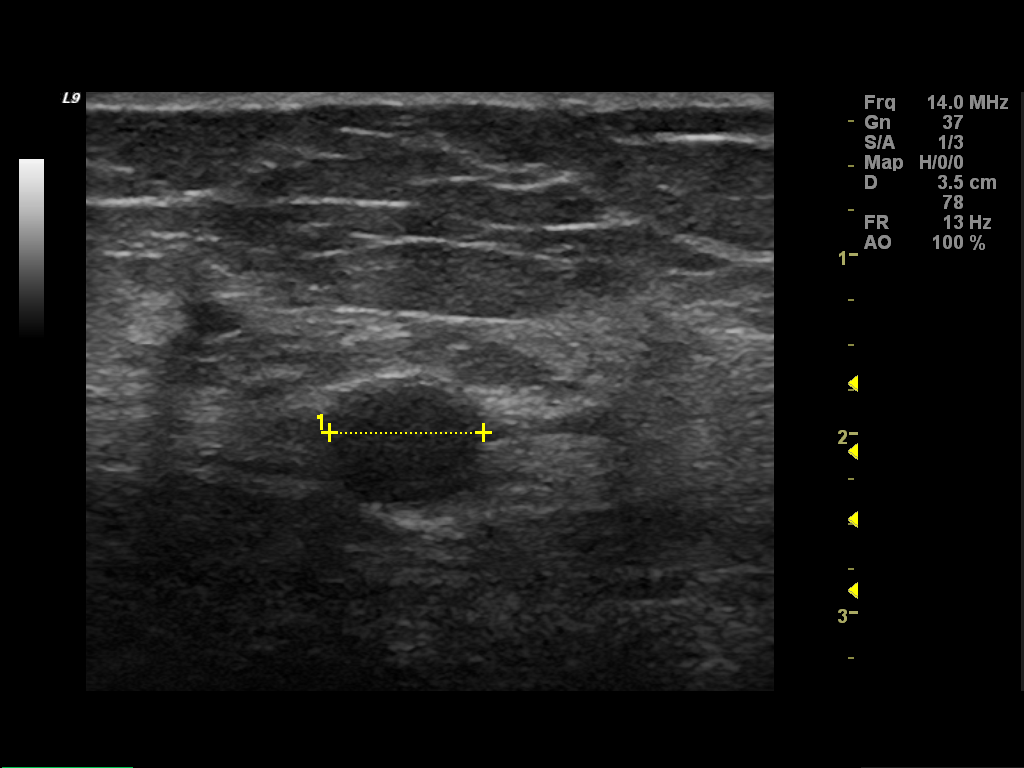

[4 of 4 positions shown; findings below may reference images not displayed]

FINDINGS: Exam demonstrates heterogeneously dense fibroglandular
tissue.  The previously seen cysts at the 12 and 2 o'clock position
have resolved.  The previously seen small mass at the 2 o'clock
position is unchanged to slightly smaller.  Remainder of the exam
is unchanged.
Mammographic images were processed with CAD.

Ultrasound is performed, showing continued evidence of a well-
defined rounded hypoechoic mass with mild increased through
transmission at the 2 o'clock position 2 cm from the nipple.  This
is slightly smaller measuring 7 x 7 x 9 mm and likely represents a
complicated cyst.
IMPRESSION: 7 x 7 x 9 mm probable complicated cyst at the 2 o'clock position of
the left breast 2 cm from the nipple unchanged to slightly smaller.

Recommendations:  Recommend an additional 6-month follow-up
diagnostic left breast mammogram and ultrasound as this will
coincide with the patient's annual mammogram in [DATE].

BI-RADS CATEGORY 3:  Probably benign finding(s) - short interval
follow-up suggested.

## 2012-06-19 ENCOUNTER — Other Ambulatory Visit: Payer: Self-pay | Admitting: *Deleted

## 2012-06-19 DIAGNOSIS — N6009 Solitary cyst of unspecified breast: Secondary | ICD-10-CM

## 2012-07-23 ENCOUNTER — Ambulatory Visit
Admission: RE | Admit: 2012-07-23 | Discharge: 2012-07-23 | Disposition: A | Discharge status: Home / Self Care | Payer: BC Managed Care – PPO | Source: Ambulatory Visit | Attending: Gynecology | Admitting: Gynecology

## 2012-07-23 DIAGNOSIS — N6009 Solitary cyst of unspecified breast: Secondary | ICD-10-CM

## 2012-07-23 IMAGING — US US BREAST*L*
1 series · 8 of 8 positions shown · non-contrast
Comparison: With priors

CLINICAL DATA: Short-term interval follow-up of a probable cyst in
the left breast

DIGITAL DIAGNOSTIC BILATERAL MAMMOGRAM WITH CAD AND LEFT BREAST
ULTRASOUND:

[Series 1: us breast*left* · 8 of 8 slices shown]
[im 1/8]
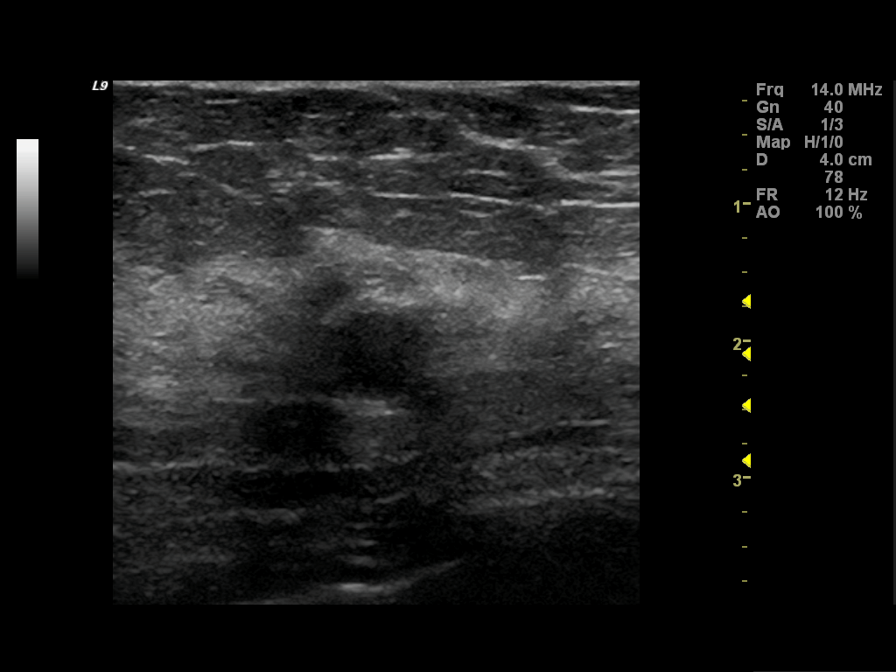
[im 2/8]
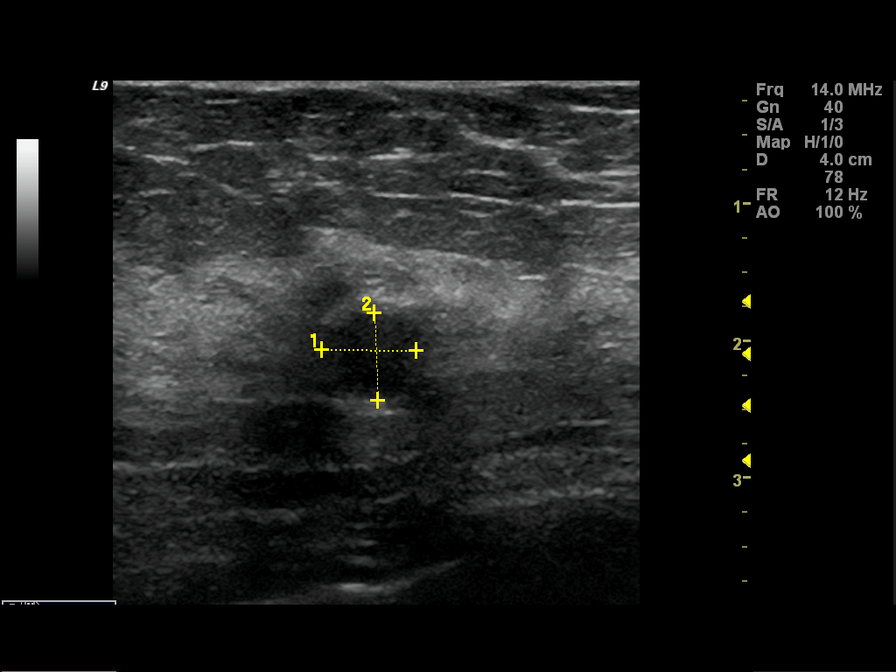
[im 3/8]
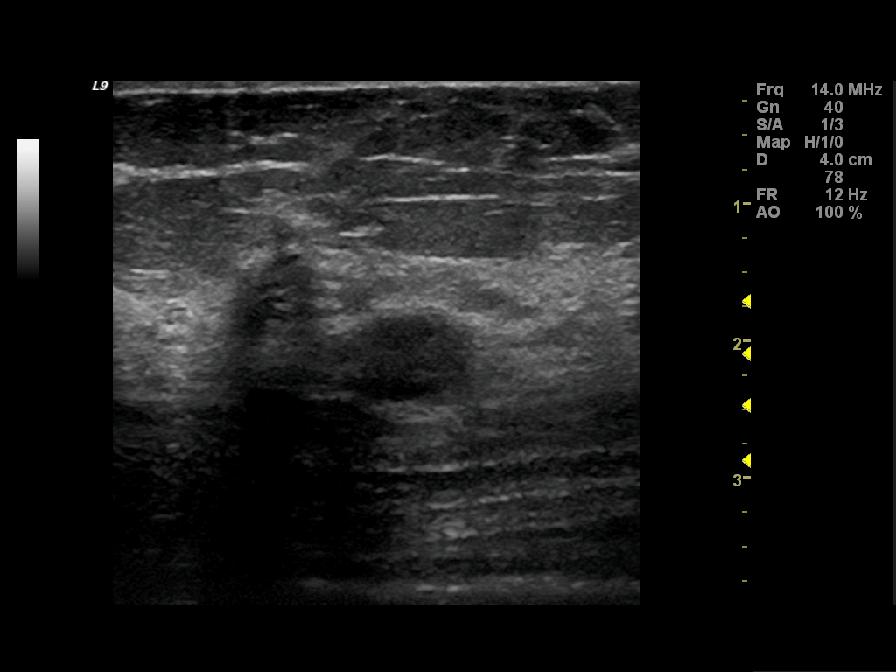
[im 4/8]
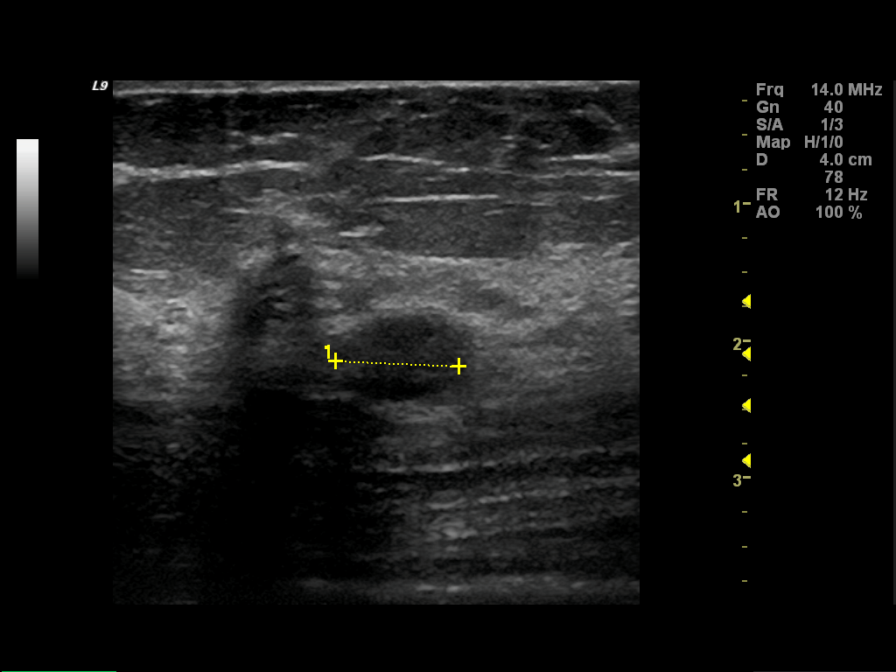
[im 5/8]
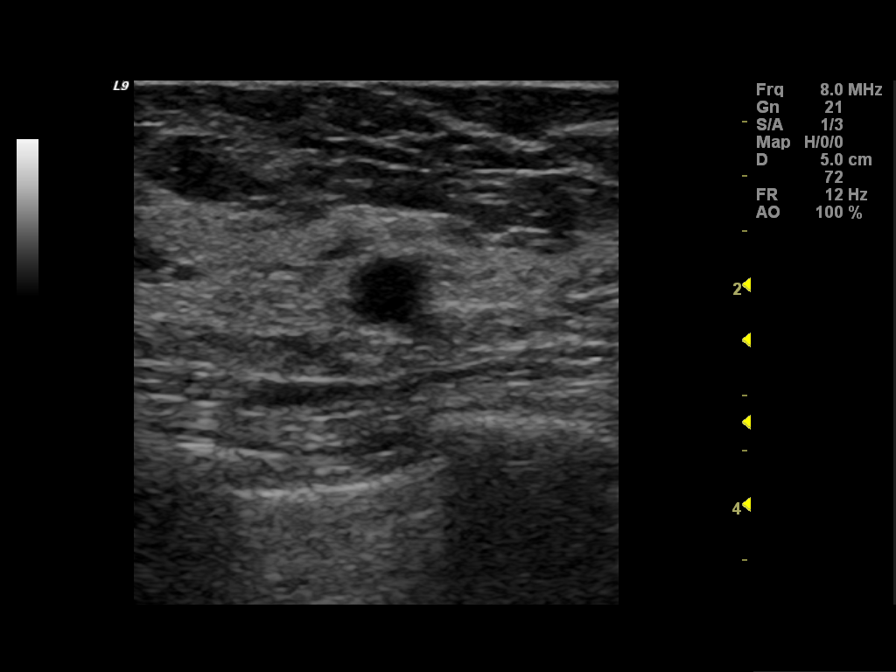
[im 6/8]
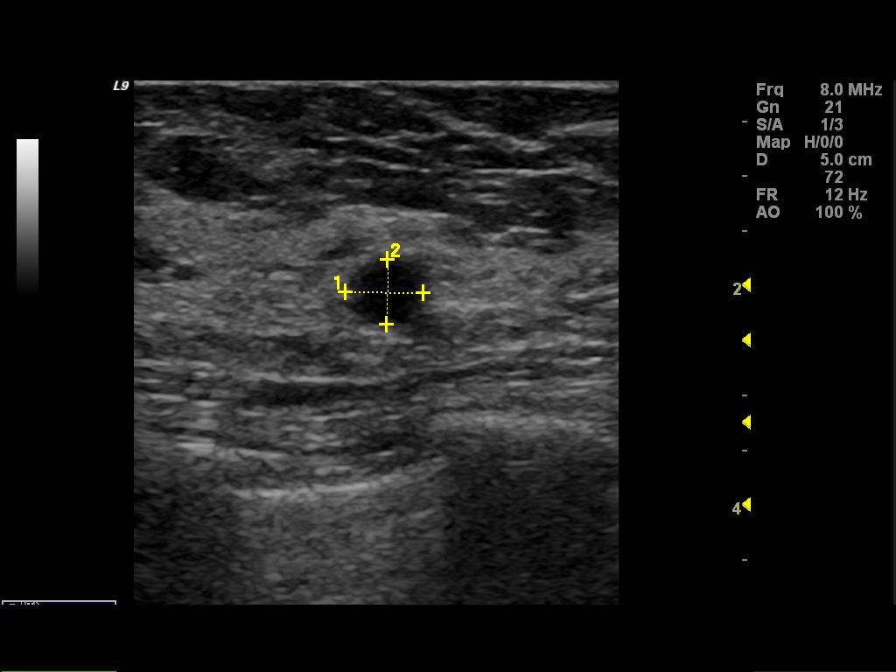
[im 7/8]
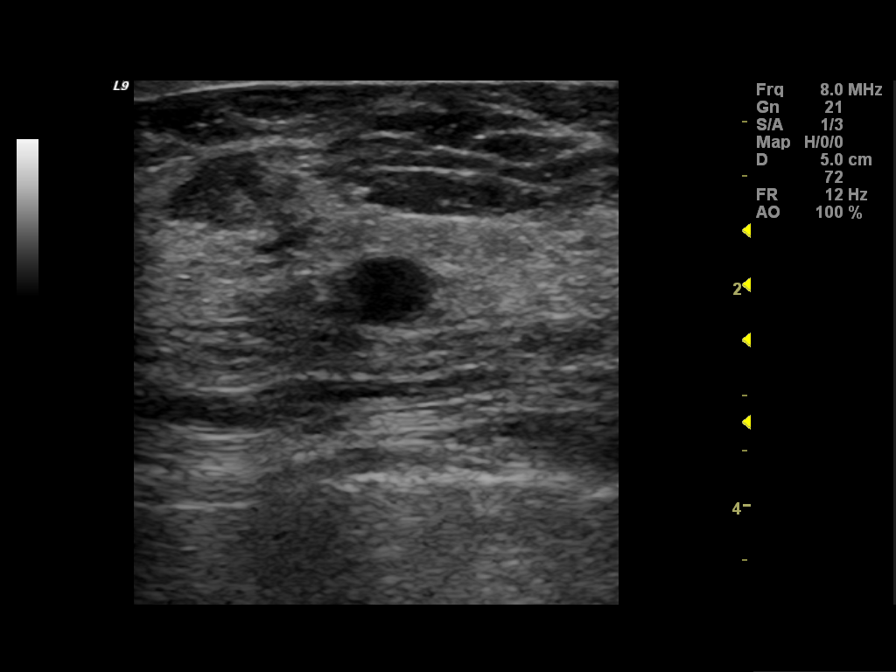
[im 8/8]
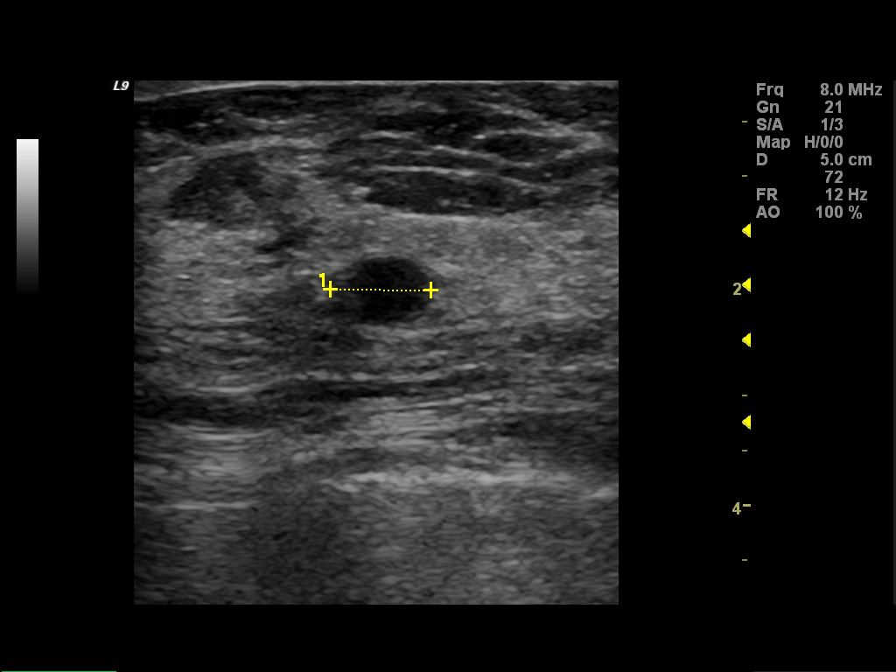

[8 of 8 positions shown; findings below may reference images not displayed]

FINDINGS: There is a dense fibroglandular pattern.  There is no
suspicious mass or malignant-type microcalcifications in either
breast.
Mammographic images were processed with CAD.

On physical exam, I do not palpate a mass in the left breast.

Ultrasound is performed, showing there is a stable, well-
circumscribed, hypoechoic mass with increased through transmission
in the left breast at 2 o'clock 2 cm from the nipple measuring 7 x
7 x 9 mm.  This is stable, when compared to the prior ultrasounds
dated [DATE] and [DATE].
IMPRESSION: Stable appearance of a probable benign lesion in the left breast.

RECOMMENDATION:
Bilateral diagnostic mammogram and left breast ultrasound in 12
months is recommended to complete 2-year follow-up.

BI-RADS CATEGORY 3:  Probably benign finding(s) - short interval
follow-up suggested.

## 2012-07-23 IMAGING — MG MM DIGITAL DIAGNOSTIC BILAT
4 series · 4 of 4 positions shown · non-contrast
Comparison: With priors

CLINICAL DATA: Short-term interval follow-up of a probable cyst in
the left breast

DIGITAL DIAGNOSTIC BILATERAL MAMMOGRAM WITH CAD AND LEFT BREAST
ULTRASOUND:

[R CC]
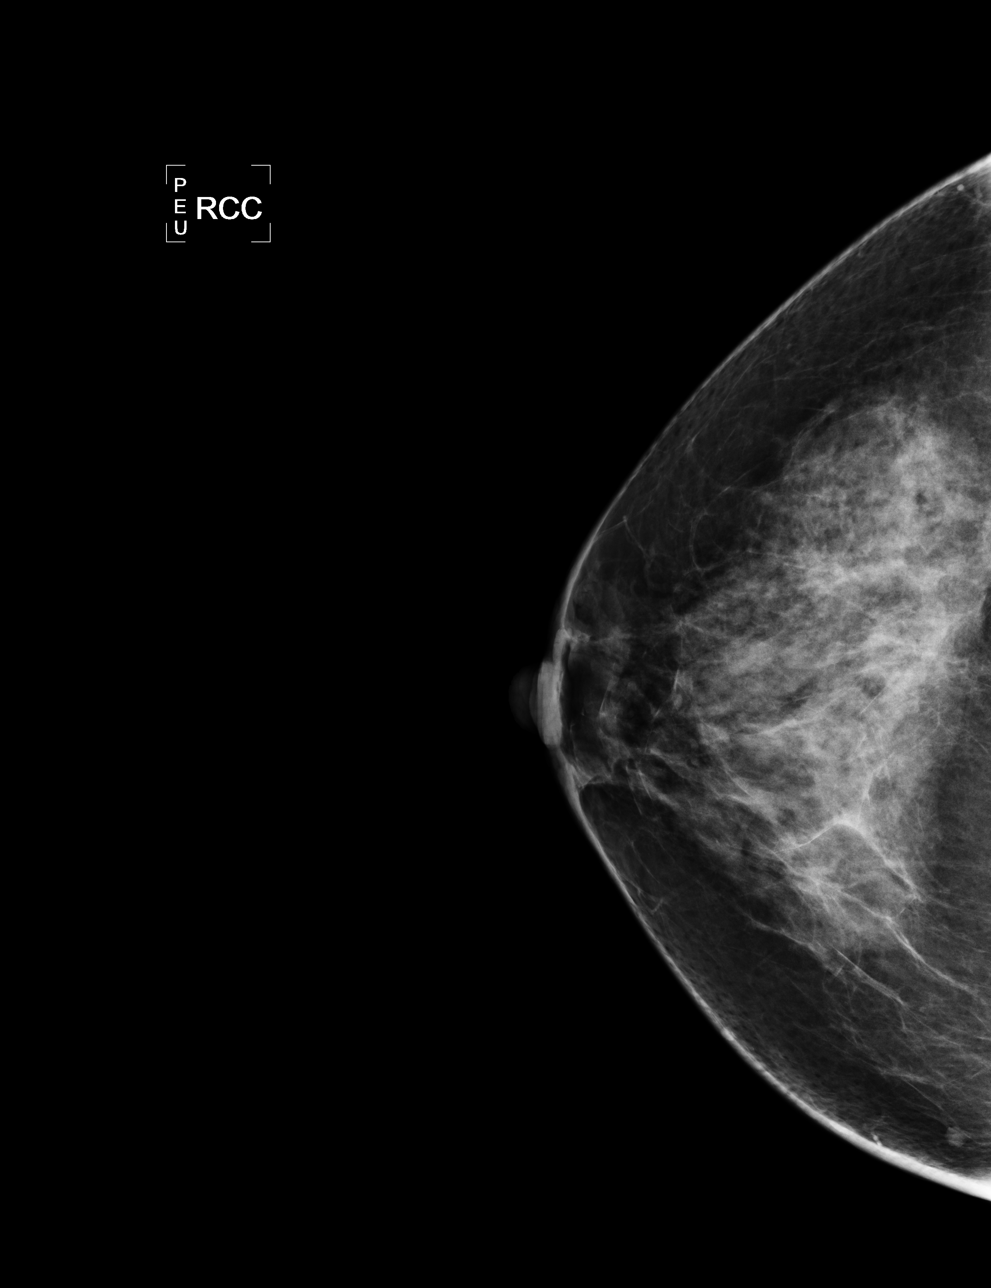

[L CC]
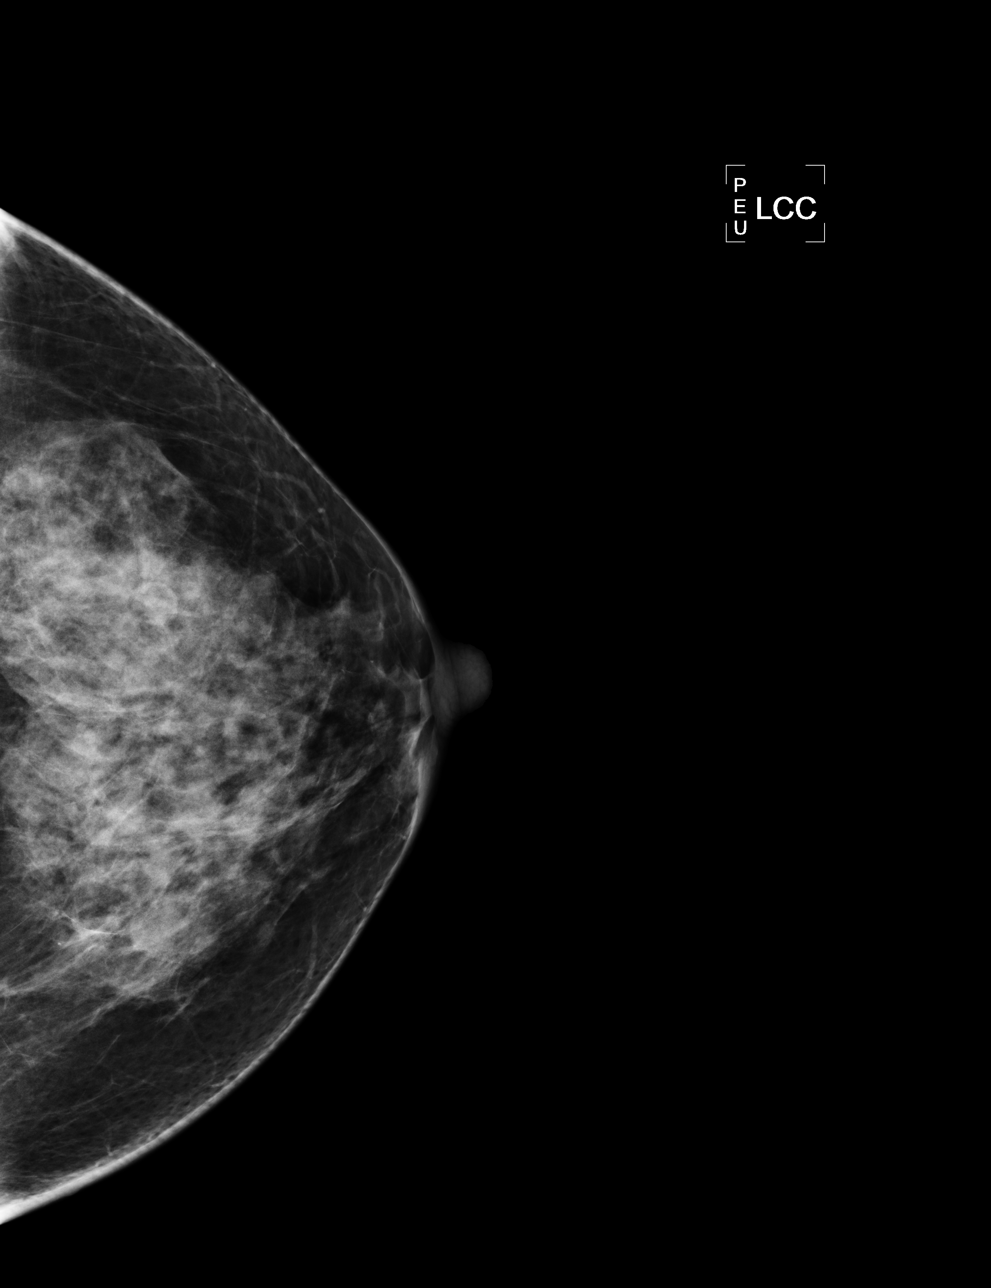

[L MLO]
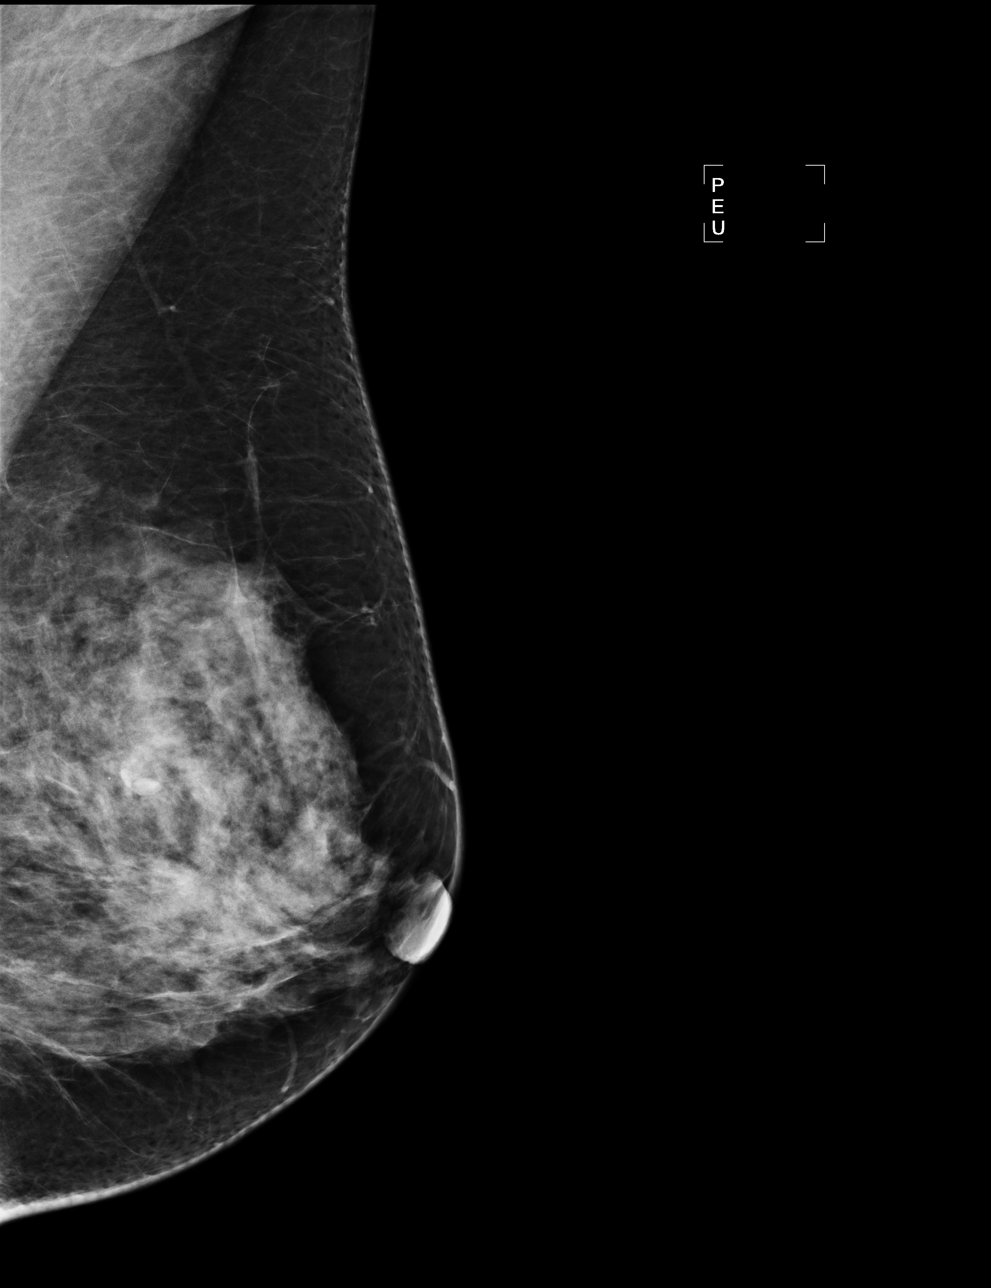

[R MLO]
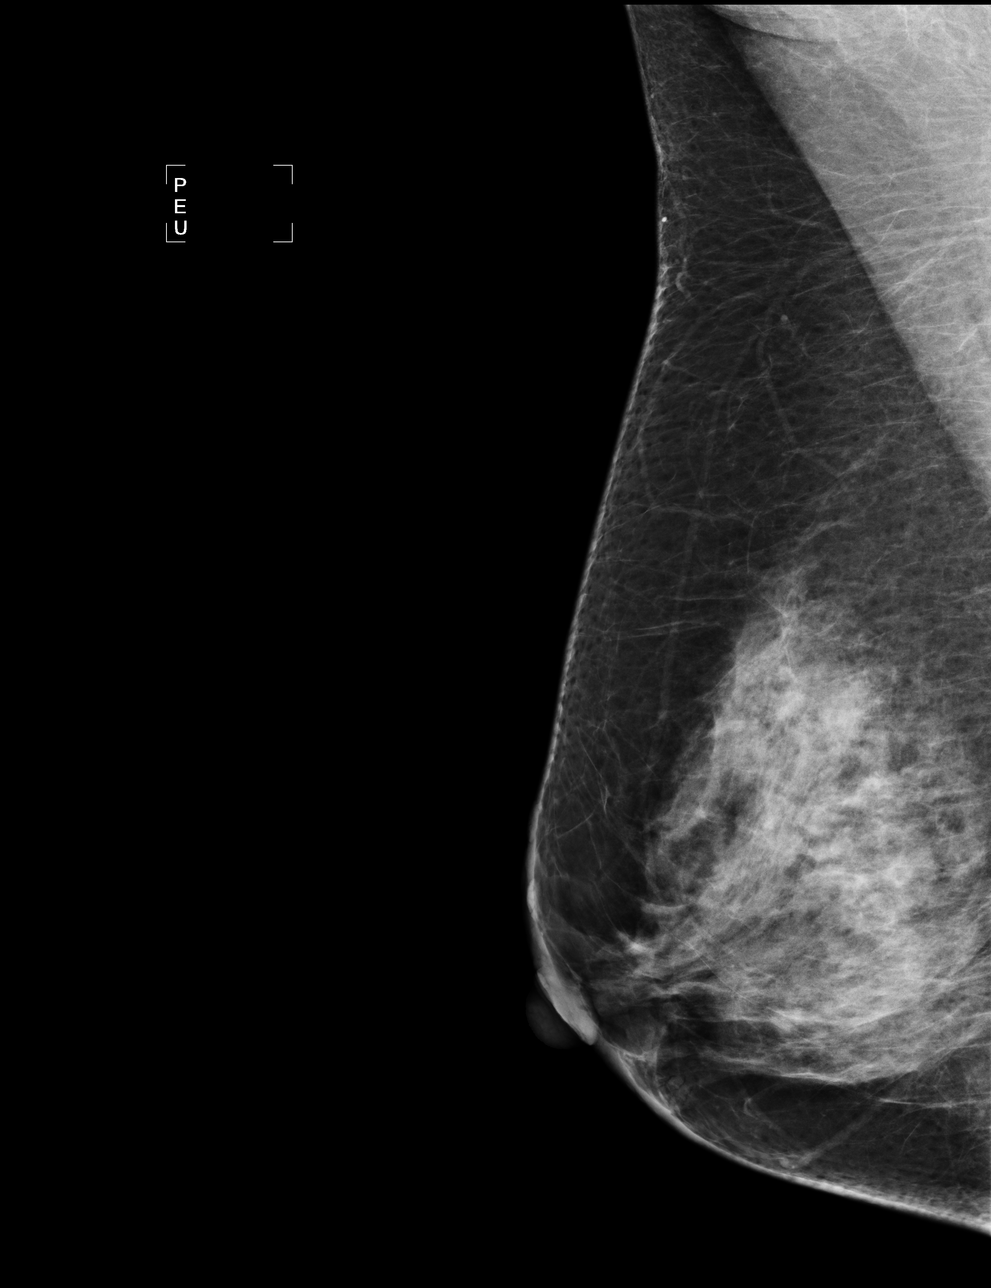

[4 of 4 positions shown; findings below may reference images not displayed]

FINDINGS: There is a dense fibroglandular pattern.  There is no
suspicious mass or malignant-type microcalcifications in either
breast.
Mammographic images were processed with CAD.

On physical exam, I do not palpate a mass in the left breast.

Ultrasound is performed, showing there is a stable, well-
circumscribed, hypoechoic mass with increased through transmission
in the left breast at 2 o'clock 2 cm from the nipple measuring 7 x
7 x 9 mm.  This is stable, when compared to the prior ultrasounds
dated [DATE] and [DATE].
IMPRESSION: Stable appearance of a probable benign lesion in the left breast.

RECOMMENDATION:
Bilateral diagnostic mammogram and left breast ultrasound in 12
months is recommended to complete 2-year follow-up.

BI-RADS CATEGORY 3:  Probably benign finding(s) - short interval
follow-up suggested.

## 2012-08-14 ENCOUNTER — Encounter: Payer: Self-pay | Admitting: Women's Health

## 2012-08-14 ENCOUNTER — Ambulatory Visit (INDEPENDENT_AMBULATORY_CARE_PROVIDER_SITE_OTHER): Payer: BC Managed Care – PPO | Admitting: Women's Health

## 2012-08-14 VITALS — BP 120/64 | Ht 66.0 in | Wt 154.0 lb

## 2012-08-14 DIAGNOSIS — E079 Disorder of thyroid, unspecified: Secondary | ICD-10-CM

## 2012-08-14 DIAGNOSIS — M949 Disorder of cartilage, unspecified: Secondary | ICD-10-CM

## 2012-08-14 DIAGNOSIS — M899 Disorder of bone, unspecified: Secondary | ICD-10-CM

## 2012-08-14 DIAGNOSIS — Z833 Family history of diabetes mellitus: Secondary | ICD-10-CM

## 2012-08-14 DIAGNOSIS — M8580 Other specified disorders of bone density and structure, unspecified site: Secondary | ICD-10-CM

## 2012-08-14 DIAGNOSIS — Z780 Asymptomatic menopausal state: Secondary | ICD-10-CM

## 2012-08-14 DIAGNOSIS — Z13220 Encounter for screening for lipoid disorders: Secondary | ICD-10-CM

## 2012-08-14 DIAGNOSIS — E039 Hypothyroidism, unspecified: Secondary | ICD-10-CM | POA: Insufficient documentation

## 2012-08-14 DIAGNOSIS — Z01419 Encounter for gynecological examination (general) (routine) without abnormal findings: Secondary | ICD-10-CM

## 2012-08-14 LAB — CBC WITH DIFFERENTIAL/PLATELET
Basophils Absolute: 0 10*3/uL (ref 0.0–0.1)
Basophils Relative: 1 % (ref 0–1)
Eosinophils Absolute: 0.1 10*3/uL (ref 0.0–0.7)
Eosinophils Relative: 1 % (ref 0–5)
HCT: 40.6 % (ref 36.0–46.0)
Hemoglobin: 14.5 g/dL (ref 12.0–15.0)
Lymphocytes Relative: 38 % (ref 12–46)
Lymphs Abs: 1.7 10*3/uL (ref 0.7–4.0)
MCH: 29 pg (ref 26.0–34.0)
MCHC: 35.7 g/dL (ref 30.0–36.0)
MCV: 81.2 fL (ref 78.0–100.0)
Monocytes Absolute: 0.2 10*3/uL (ref 0.1–1.0)
Monocytes Relative: 5 % (ref 3–12)
Neutro Abs: 2.4 10*3/uL (ref 1.7–7.7)
Neutrophils Relative %: 55 % (ref 43–77)
Platelets: 215 10*3/uL (ref 150–400)
RBC: 5 MIL/uL (ref 3.87–5.11)
RDW: 13.3 % (ref 11.5–15.5)
WBC: 4.4 10*3/uL (ref 4.0–10.5)

## 2012-08-14 LAB — TSH: TSH: 0.99 u[IU]/mL (ref 0.350–4.500)

## 2012-08-14 LAB — LIPID PANEL
Cholesterol: 236 mg/dL — ABNORMAL HIGH (ref 0–200)
HDL: 49 mg/dL (ref >39)
LDL Cholesterol: 161 mg/dL — ABNORMAL HIGH (ref 0–99)
Total CHOL/HDL Ratio: 4.8 Ratio
Triglycerides: 130 mg/dL (ref <150)
VLDL: 26 mg/dL (ref 0–40)

## 2012-08-14 LAB — GLUCOSE, RANDOM: Glucose, Bld: 83 mg/dL (ref 70–99)

## 2012-08-14 MED ORDER — ESTRADIOL 0.05 MG/24HR TD PTWK: 1.0000 | MEDICATED_PATCH | TRANSDERMAL | Status: DC

## 2012-08-14 NOTE — Patient Instructions (Signed)

## 2012-08-14 NOTE — Progress Notes (Addendum)
Sharon Kirk 1961/04/03 [id number]    History:    The patient presents for annual exam.  Postmenopausal on estradiol patch without complaint.  TVH 1991 for endometriosis and menorrhagia. Osteopenic T score -1.6 femoral neck 2011. Hypothyroid currently on no Synthroid per Dr. Chestine Spore. Ultrasound for thyroid nodules 2011. Normal colonoscopy 2010. History of normal Paps and mammograms. 2013 breast ultrasound  with benign findings.   Past medical history, past surgical history, family history and social history were all reviewed and documented in the EPIC chart. Works at [employer]. Brothers diabetes, mother hypertensive,   ROS:  A  ROS was performed and pertinent positives and negatives are included in the history.  Exam:  Filed Vitals:   08/14/12 0808  BP: 120/64    General appearance:  Normal Head/Neck:  Normal, without cervical or supraclavicular adenopathy. Thyroid:  Symmetrical, normal in size, without palpable masses or nodularity. Respiratory  Effort:  Normal  Auscultation:  Clear without wheezing or rhonchi Cardiovascular  Auscultation:  Regular rate, without rubs, murmurs or gallops  Edema/varicosities:  Not grossly evident Abdominal  Soft,nontender, without masses, guarding or rebound.  Liver/spleen:  No organomegaly noted  Hernia:  None appreciated  Skin  Inspection:  Grossly normal  Palpation:  Grossly normal Neurologic/psychiatric  Orientation:  Normal with appropriate conversation.  Mood/affect:  Normal  Genitourinary    Breasts: Examined lying and sitting.     Right: Without masses, retractions, discharge or axillary adenopathy.     Left: Without masses, retractions, discharge or axillary adenopathy.   Inguinal/mons:  Normal without inguinal adenopathy  External genitalia:  Normal  BUS/Urethra/Skene's glands:  Normal  Bladder:  Normal  Vagina:  Normal  Cervix:  Absent  Uterus:  Absent  Adnexa/parametria:     Rt: Without masses or tenderness.   Lt: Without  masses or tenderness.  Anus and perineum: Normal  Digital rectal exam: Normal sphincter tone without palpated masses or tenderness  Assessment/Plan:  51 y.o. M. WF G0 for annual exam with no complaints.  Postmenopausal TVH estradiol patch Hypothyroid currently on no Synthroid Osteopenic T score -1.6 femoral neck 06/2010  Plan: Estradiol patch 0.05 weekly prescription, proper use, slight risk for blood clots, strokes, breast cancer reviewed and accepted. States has numerous hot flushes with poor sleep without.(Stopped patch for one month and became symptomatic with hot flushes.)  SBE's, continue annual mammogram, 3-D mammography reviewed. DEXA will schedule. Reviewed importance of exercise for bone health, continue vitamin D 2000 daily. CBC, TSH, lipid panel, glucose, UA. Reviewed new screening Pap guidelines. Home Hemoccult card given with instructions to return.    Harrington Challenger [facility], 8:38 AM 08/14/2012

## 2012-08-15 LAB — URINALYSIS W MICROSCOPIC + REFLEX CULTURE
Bilirubin Urine: NEGATIVE
Casts: NONE SEEN
Crystals: NONE SEEN
Glucose, UA: NEGATIVE mg/dL
Hgb urine dipstick: NEGATIVE
Ketones, ur: NEGATIVE mg/dL
Leukocytes, UA: NEGATIVE
Nitrite: NEGATIVE
Protein, ur: NEGATIVE mg/dL
Specific Gravity, Urine: 1.022 (ref 1.005–1.030)
Urobilinogen, UA: 0.2 mg/dL (ref 0.0–1.0)
pH: 7 (ref 5.0–8.0)

## 2012-08-16 ENCOUNTER — Other Ambulatory Visit: Payer: Self-pay | Admitting: Women's Health

## 2012-08-16 DIAGNOSIS — N390 Urinary tract infection, site not specified: Secondary | ICD-10-CM

## 2012-08-16 DIAGNOSIS — E7800 Pure hypercholesterolemia, unspecified: Secondary | ICD-10-CM

## 2012-08-16 MED ORDER — SULFAMETHOXAZOLE-TRIMETHOPRIM 800-160 MG PO TABS: 1.0000 | ORAL_TABLET | Freq: Two times a day (BID) | ORAL | Status: DC

## 2012-08-17 LAB — URINE CULTURE: Colony Count: >=100000

## 2012-08-28 ENCOUNTER — Other Ambulatory Visit: Payer: Self-pay | Admitting: Anesthesiology

## 2012-08-28 DIAGNOSIS — Z1211 Encounter for screening for malignant neoplasm of colon: Secondary | ICD-10-CM

## 2012-08-29 ENCOUNTER — Other Ambulatory Visit: Payer: Self-pay | Admitting: Women's Health

## 2012-08-29 DIAGNOSIS — Z1211 Encounter for screening for malignant neoplasm of colon: Secondary | ICD-10-CM

## 2012-08-30 ENCOUNTER — Other Ambulatory Visit: Payer: BC Managed Care – PPO

## 2012-08-30 DIAGNOSIS — N390 Urinary tract infection, site not specified: Secondary | ICD-10-CM

## 2012-08-31 LAB — URINALYSIS W MICROSCOPIC + REFLEX CULTURE
Bacteria, UA: NONE SEEN
Bilirubin Urine: NEGATIVE
Casts: NONE SEEN
Crystals: NONE SEEN
Glucose, UA: NEGATIVE mg/dL
Hgb urine dipstick: NEGATIVE
Ketones, ur: NEGATIVE mg/dL
Leukocytes, UA: NEGATIVE
Nitrite: NEGATIVE
Protein, ur: NEGATIVE mg/dL
Specific Gravity, Urine: 1.012 (ref 1.005–1.030)
Urobilinogen, UA: 0.2 mg/dL (ref 0.0–1.0)
pH: 6 (ref 5.0–8.0)

## 2012-09-19 ENCOUNTER — Ambulatory Visit (INDEPENDENT_AMBULATORY_CARE_PROVIDER_SITE_OTHER): Payer: BC Managed Care – PPO

## 2012-09-19 ENCOUNTER — Other Ambulatory Visit: Payer: Self-pay | Admitting: Obstetrics and Gynecology

## 2012-09-19 DIAGNOSIS — M8580 Other specified disorders of bone density and structure, unspecified site: Secondary | ICD-10-CM

## 2012-09-19 DIAGNOSIS — M899 Disorder of bone, unspecified: Secondary | ICD-10-CM

## 2013-01-16 ENCOUNTER — Other Ambulatory Visit: Payer: Self-pay | Admitting: Internal Medicine

## 2013-01-16 DIAGNOSIS — E049 Nontoxic goiter, unspecified: Secondary | ICD-10-CM

## 2013-01-21 ENCOUNTER — Ambulatory Visit (HOSPITAL_COMMUNITY): Payer: BC Managed Care – PPO

## 2013-01-22 ENCOUNTER — Ambulatory Visit (HOSPITAL_COMMUNITY)
Admission: RE | Admit: 2013-01-22 | Discharge: 2013-01-22 | Disposition: A | Discharge status: Home / Self Care | Payer: BC Managed Care – PPO | Source: Ambulatory Visit | Attending: Internal Medicine | Admitting: Internal Medicine

## 2013-01-22 DIAGNOSIS — E049 Nontoxic goiter, unspecified: Secondary | ICD-10-CM

## 2013-01-22 DIAGNOSIS — E042 Nontoxic multinodular goiter: Secondary | ICD-10-CM | POA: Insufficient documentation

## 2013-01-22 IMAGING — US US SOFT TISSUE HEAD/NECK
1 series · 13 of 25 positions shown · non-contrast
Comparison: Thyroid ultrasound - [DATE]; [DATE]

CLINICAL DATA: Enlarged thyroid gland

THYROID ULTRASOUND
TECHNIQUE: Ultrasound examination of the thyroid gland and adjacent
soft tissues was performed.

[Series 1: us soft tissue head/neck · 0.08mm/px · 13 of 93 slices shown]
[im 1/93]
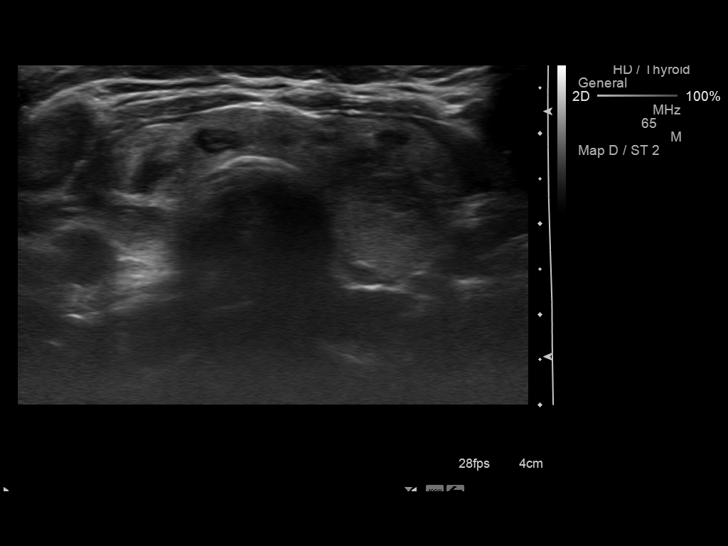
[im 8/93]
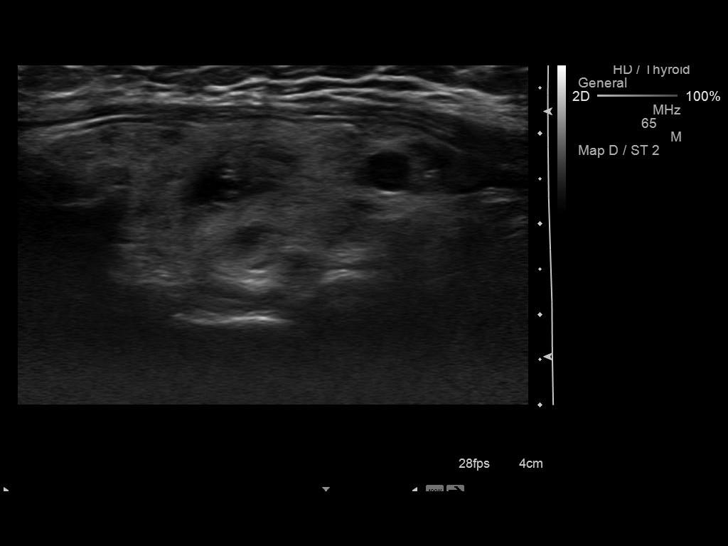
[im 16/93]
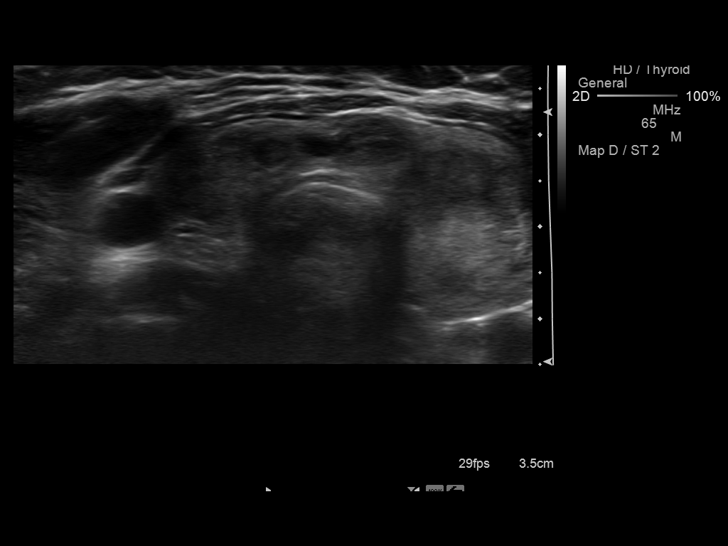
[im 24/93]
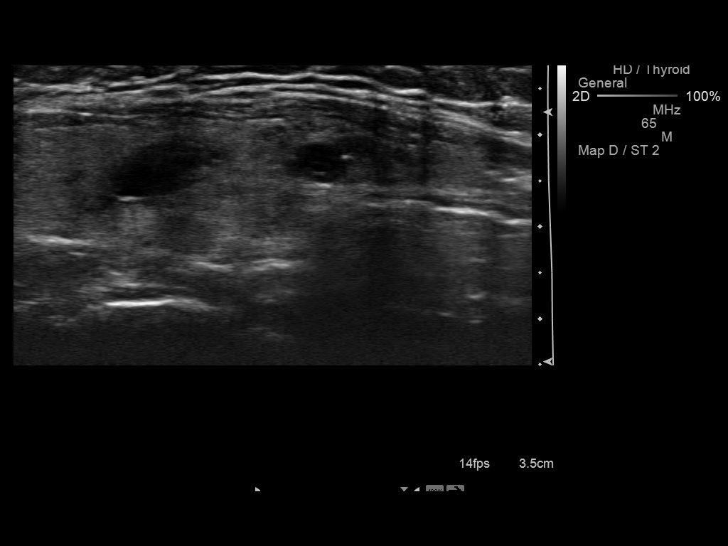
[im 31/93]
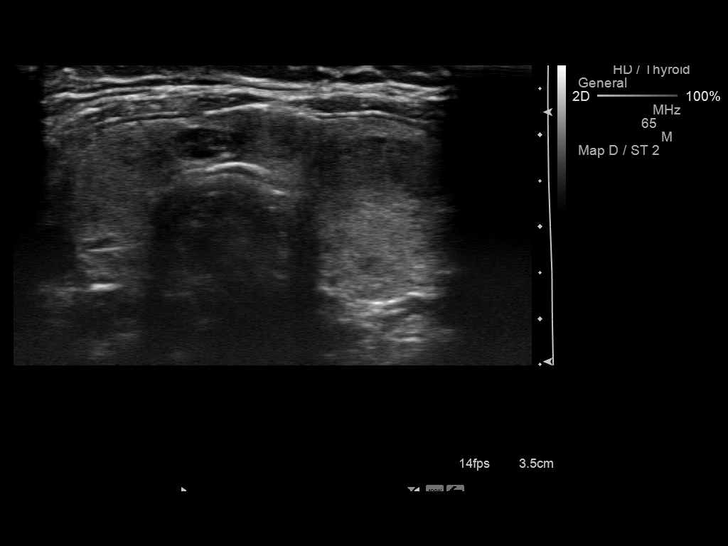
[im 39/93]
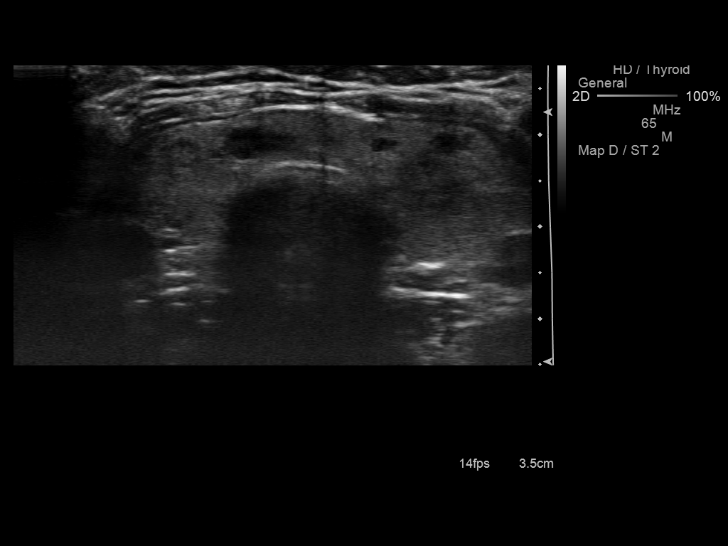
[im 47/93]
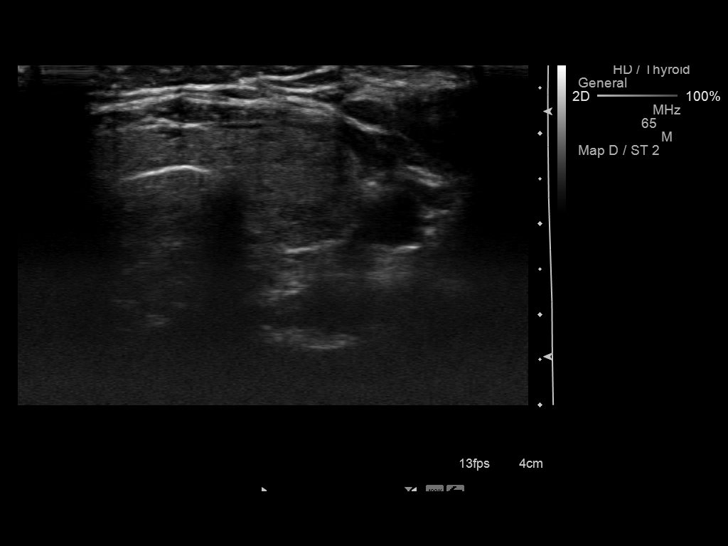
[im 54/93]
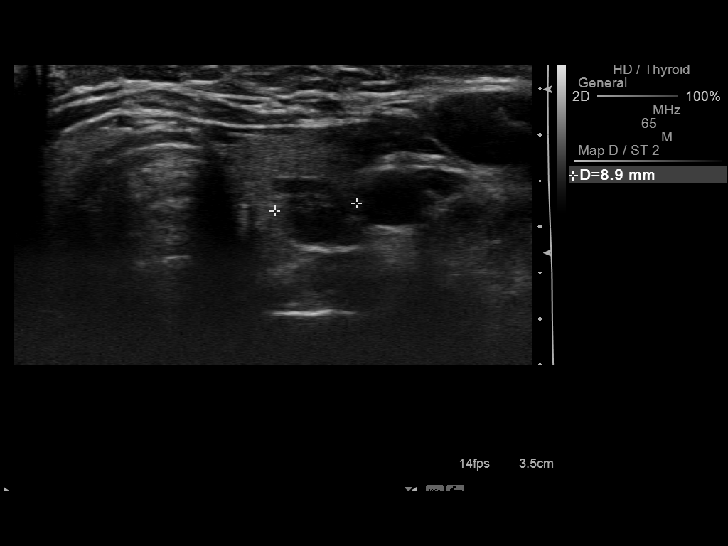
[im 62/93]
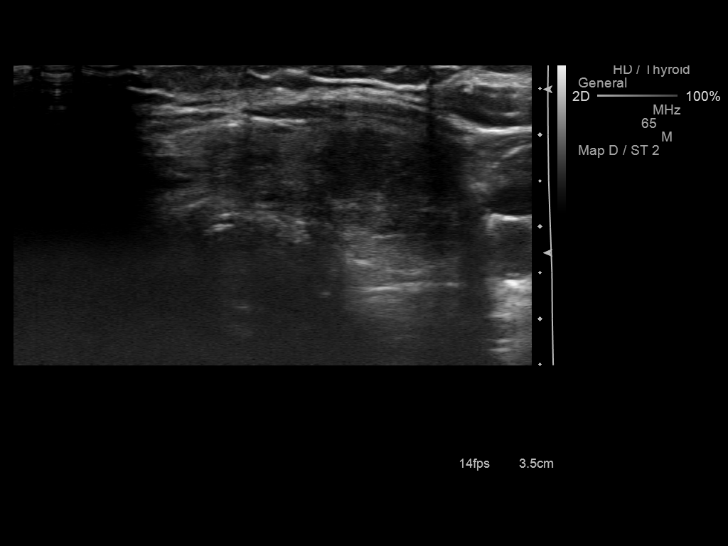
[im 70/93]
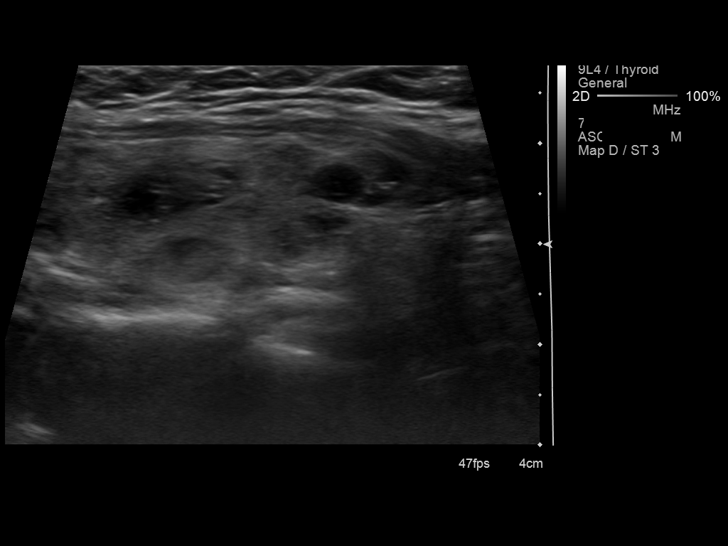
[im 77/93]
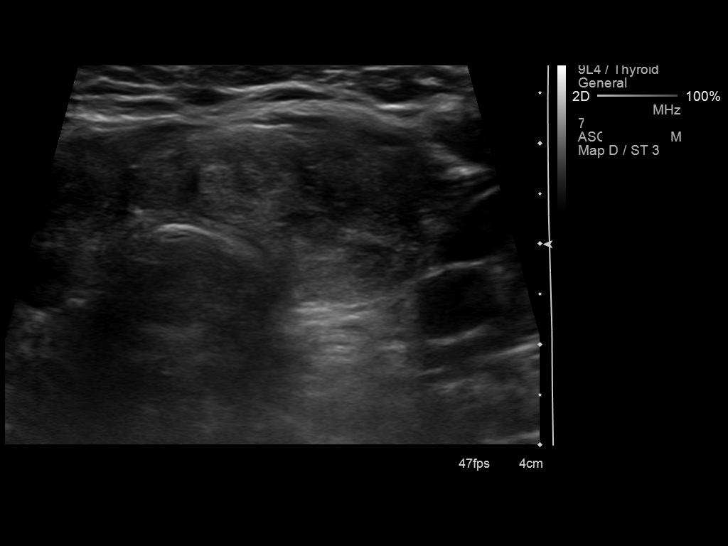
[im 85/93]
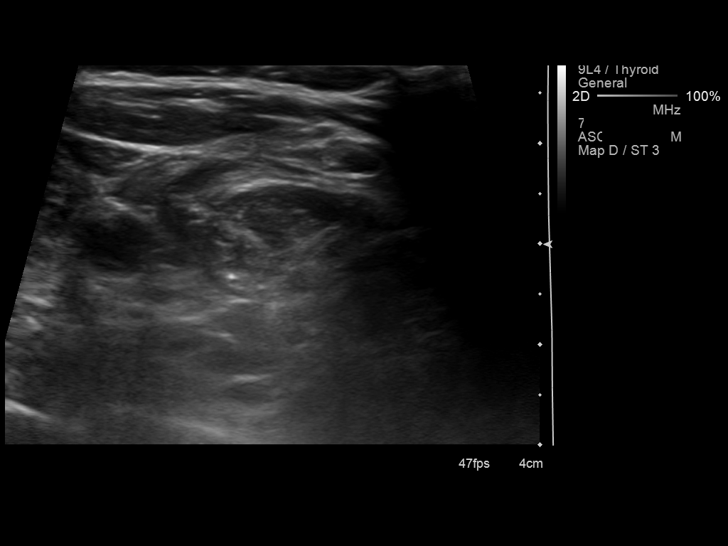
[im 93/93]
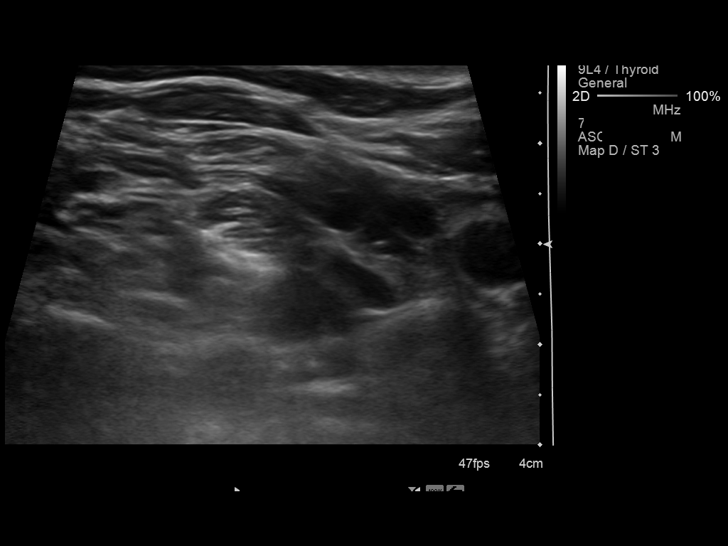

[13 of 25 positions shown; findings below may reference images not displayed]

FINDINGS: There is diffuse heterogeneity of thyroid parenchymal echotexture.

Right thyroid lobe:  4.8 x 1.7 x 1.6 cm
Left thyroid lobe:  5.3 x 2.0 x 2.0 cm
Isthmus:  0.8 cm in diameter.

Focal nodules:

Right, mid/superior- 1.6 x 0.9 x 1.1 cm, partially cystic,
partially solid, minimally increased in size in the interval,
previously, 1.1 cm in greatest dimension.

Right, anterior - 1.2 x 0.7 x 1.1 cm, partially cystic, partially
solid, possibly new

Isthmus - 1.1 x 0.7 x 1.1 cm, slightly hypoechoic, solid, minimally
increased, previously, 1 cm in greatest diameter.

Left, inferior - 2.5 x 1.6 x 2.0 cm, mixed echogenic, solid,
unchanged, previously 2.6 x 1.2 x 1.5 cm.

Left, superior/mid - 1.3 x 0.8 x 0.9 cm, possibly new

Lymphadenopathy:  None visualized.
IMPRESSION: 1.  Overall findings compatible with multinodular goiter.
2.  Minimal increase in the now approximately 1.6 cm nodule within
the mid/superior aspects of the right lobe of the thyroid.  This
nodule now meets size criteria for percutaneous sampling as
indicated.  This recommendation follows the consensus statement:
Management of Thyroid Nodules Detected as US:  Society of
Radiologists in Ultrasound Consensus Conference Statement.
3. Possible development of new bilateral thyroid nodules, the right
measuring 1.2 cm and the left measuring 1.3 cm, neither of which
reaches size criteria for percutaneous sampling.  Continued
attention on 1 year follow-up ultrasound is recommended.
4.  Unchanged size of dominant approximately 2.5 cm nodule within
the inferior aspect of the left lobe of the thyroid.  By report,
this nodule has been previously biopsied - correlation with biopsy
results is recommended, though stability since the [M2] examination
suggests a benign etiology.

## 2013-02-02 ENCOUNTER — Ambulatory Visit (INDEPENDENT_AMBULATORY_CARE_PROVIDER_SITE_OTHER): Payer: BC Managed Care – PPO | Admitting: Physician Assistant

## 2013-02-02 VITALS — BP 103/68 | HR 72 | Temp 98.1°F | Resp 16 | Ht 66.0 in | Wt 160.0 lb

## 2013-02-02 DIAGNOSIS — J329 Chronic sinusitis, unspecified: Secondary | ICD-10-CM

## 2013-02-02 DIAGNOSIS — R059 Cough, unspecified: Secondary | ICD-10-CM

## 2013-02-02 DIAGNOSIS — R05 Cough: Secondary | ICD-10-CM

## 2013-02-02 MED ORDER — BENZONATATE 100 MG PO CAPS: 100.0000 mg | ORAL_CAPSULE | Freq: Three times a day (TID) | ORAL | Status: DC | PRN

## 2013-02-02 MED ORDER — GUAIFENESIN ER 1200 MG PO TB12: 1.0000 | ORAL_TABLET | Freq: Two times a day (BID) | ORAL | Status: DC | PRN

## 2013-02-02 MED ORDER — AMOXICILLIN 875 MG PO TABS: 875.0000 mg | ORAL_TABLET | Freq: Two times a day (BID) | ORAL | Status: DC

## 2013-02-02 NOTE — Patient Instructions (Signed)
Discontinue any decongestant-containing products. Continue the Claritin and saline nasal spray. Get plenty of rest and drink at least 64 ounces of water daily.

## 2013-02-02 NOTE — Progress Notes (Signed)
  Subjective:    Patient ID: Sharon Kirk, female    DOB: November 09, 1960, 52 y.o.   MRN: [id number]  HPI This 52 y.o. female presents for evaluation of "sinus infection."  3 days of nasa; congestion and drainage, with cough. Taking claritin daily without benefit.Tried multiple OTC products without benefit, including saline nasal spray ("and I don't do nasal sprays").  Some fever/chills.  No GI symptoms/GU symptoms.  Has achiness.  Non-productive cough. Facial pain and pressure.  PMH, SurgHx, Soc Hx, FHx, current medications, allergies and problem list reviewed and updated.  Review of Systems As above.  No SOB, Dizziness, CP.    Objective:   Physical Exam Blood pressure 103/68, pulse 72, temperature 98.1 F (36.7 C), temperature source Oral, resp. rate 16, height 5\' 6"  (1.676 m), weight 160 lb (72.576 kg), last menstrual period 06/30/1990, SpO2 99.00%. Body mass index is 25.84 kg/(m^2). Well-developed, well nourished WF who is awake, alert and oriented, in NAD. HEENT: [place]/AT, PERRL, EOMI.  Sclera and conjunctiva are clear.  EAC are patent, TMs are normal in appearance. Nasal mucosa is congested, pink and moist. OP is clear. Neck: supple, non-tender, no lymphadenopathy, thyromegaly. Heart: RRR, no murmur Lungs: normal effort, CTA Extremities: no cyanosis, clubbing or edema. Skin: warm and dry without rash. Psychologic: good mood and appropriate affect, normal speech and behavior.        Assessment & Plan:  Sinusitis - Plan: Guaifenesin (MUCINEX MAXIMUM STRENGTH) 1200 MG TB12, amoxicillin (AMOXIL) 875 MG tablet  Cough - Plan: benzonatate (TESSALON) 100 MG capsule   Patient Instructions  Discontinue any decongestant-containing products. Continue the Claritin and saline nasal spray. Get plenty of rest and drink at least 64 ounces of water daily.    Fernande Bras, PA-C Physician Assistant-Certified [facility]

## 2013-02-03 ENCOUNTER — Encounter: Payer: Self-pay | Admitting: Radiology

## 2013-06-12 ENCOUNTER — Other Ambulatory Visit: Payer: Self-pay | Admitting: Women's Health

## 2013-06-12 DIAGNOSIS — N649 Disorder of breast, unspecified: Secondary | ICD-10-CM

## 2013-07-29 ENCOUNTER — Ambulatory Visit
Admission: RE | Admit: 2013-07-29 | Discharge: 2013-07-29 | Disposition: A | Discharge status: Home / Self Care | Payer: BC Managed Care – PPO | Source: Ambulatory Visit | Attending: Women's Health | Admitting: Women's Health

## 2013-07-29 DIAGNOSIS — N649 Disorder of breast, unspecified: Secondary | ICD-10-CM

## 2013-07-29 IMAGING — MG MM DIAGNOSTIC BILATERAL
4 series · 4 of 4 positions shown · non-contrast
Comparison: Priors including [DATE]

CLINICAL DATA: Patient for 2 year followup of left breast mass

EXAM:
DIGITAL DIAGNOSTIC BILATERAL MAMMOGRAM; ULTRASOUND LEFT BREAST

[R CC]
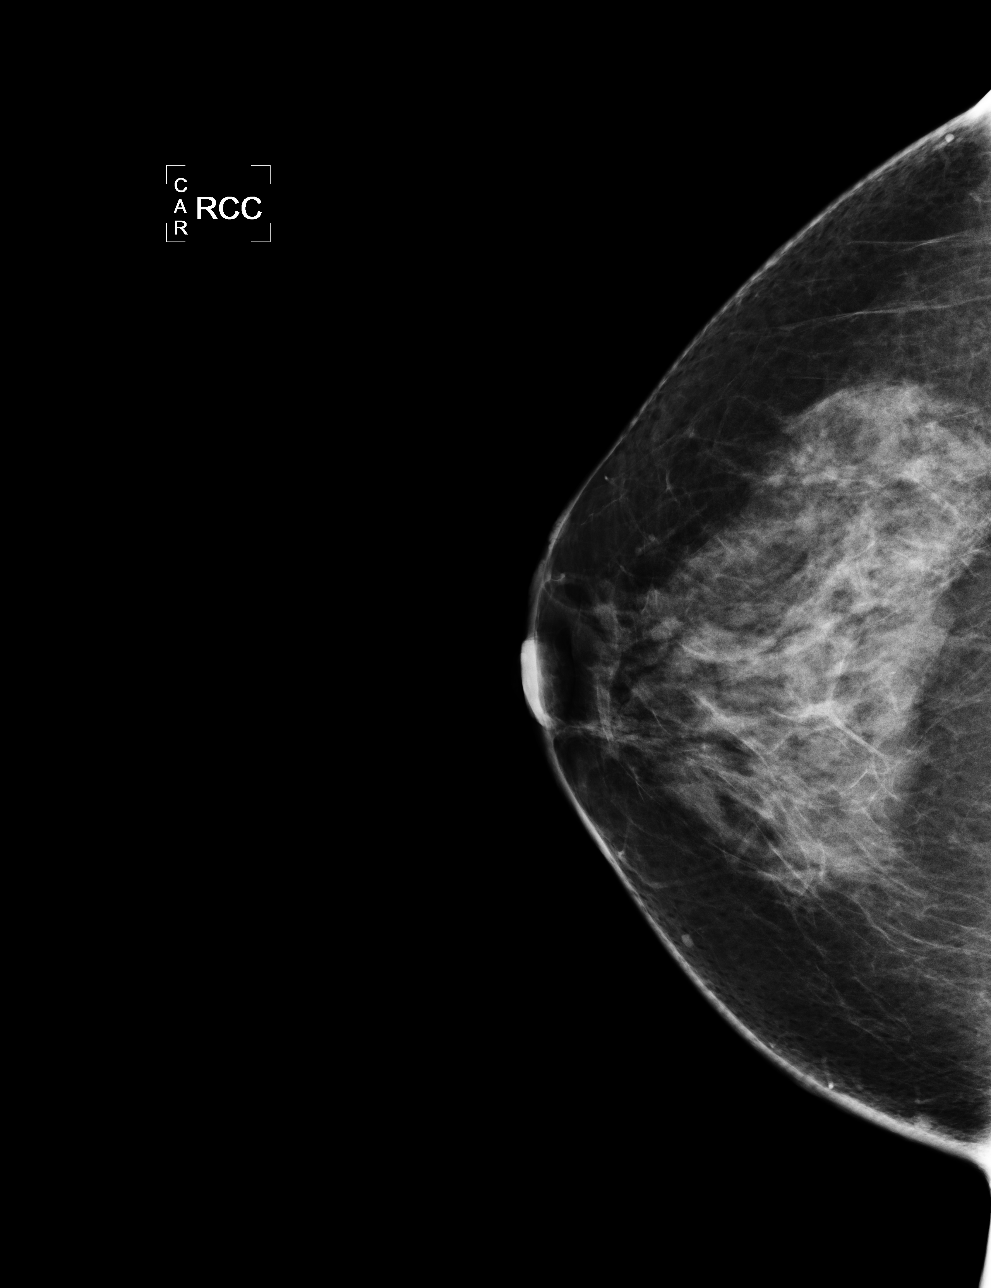

[L CC]
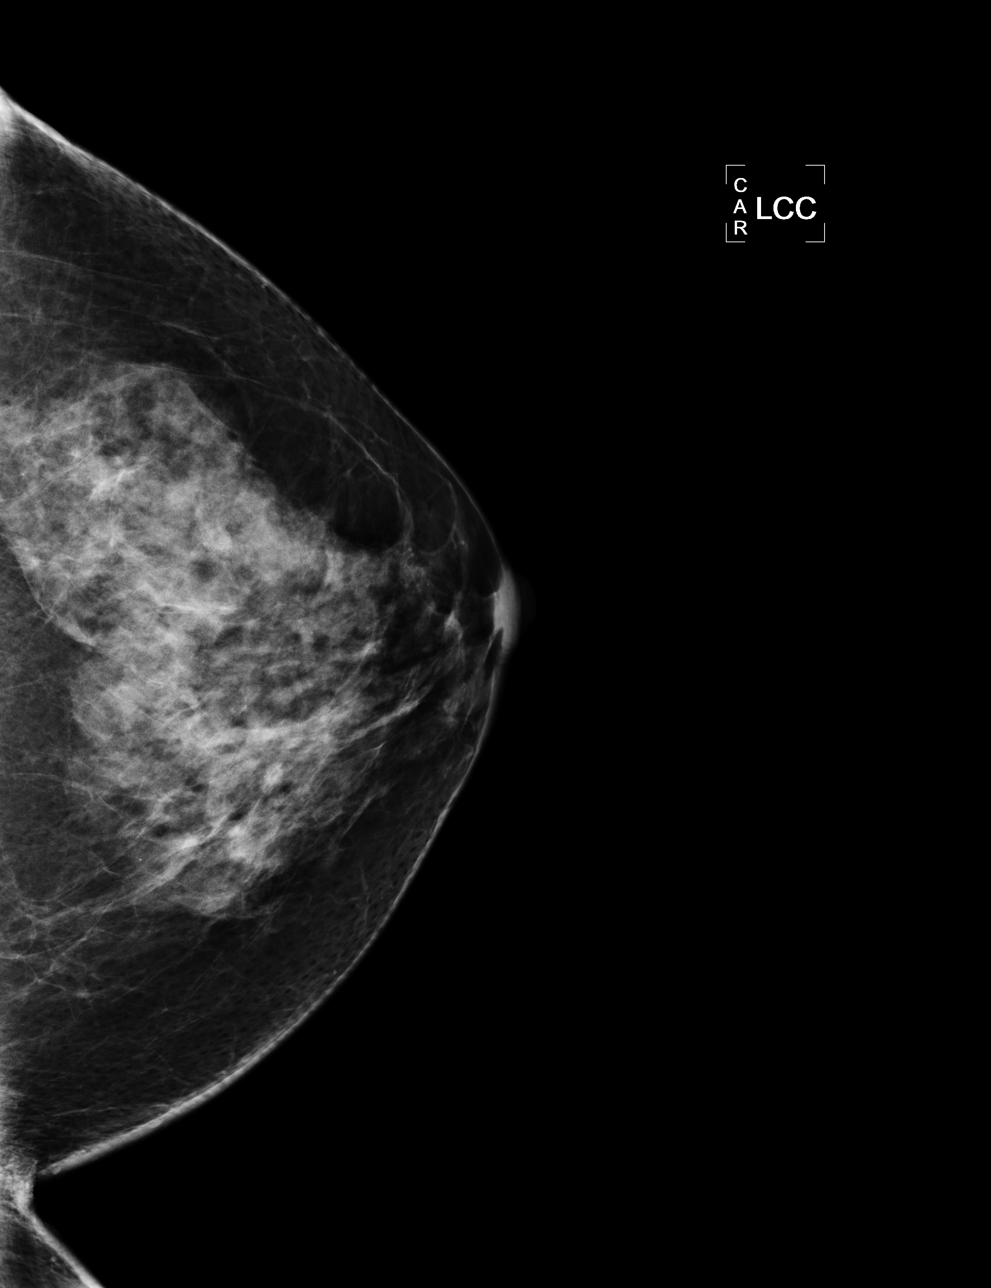

[L MLO]
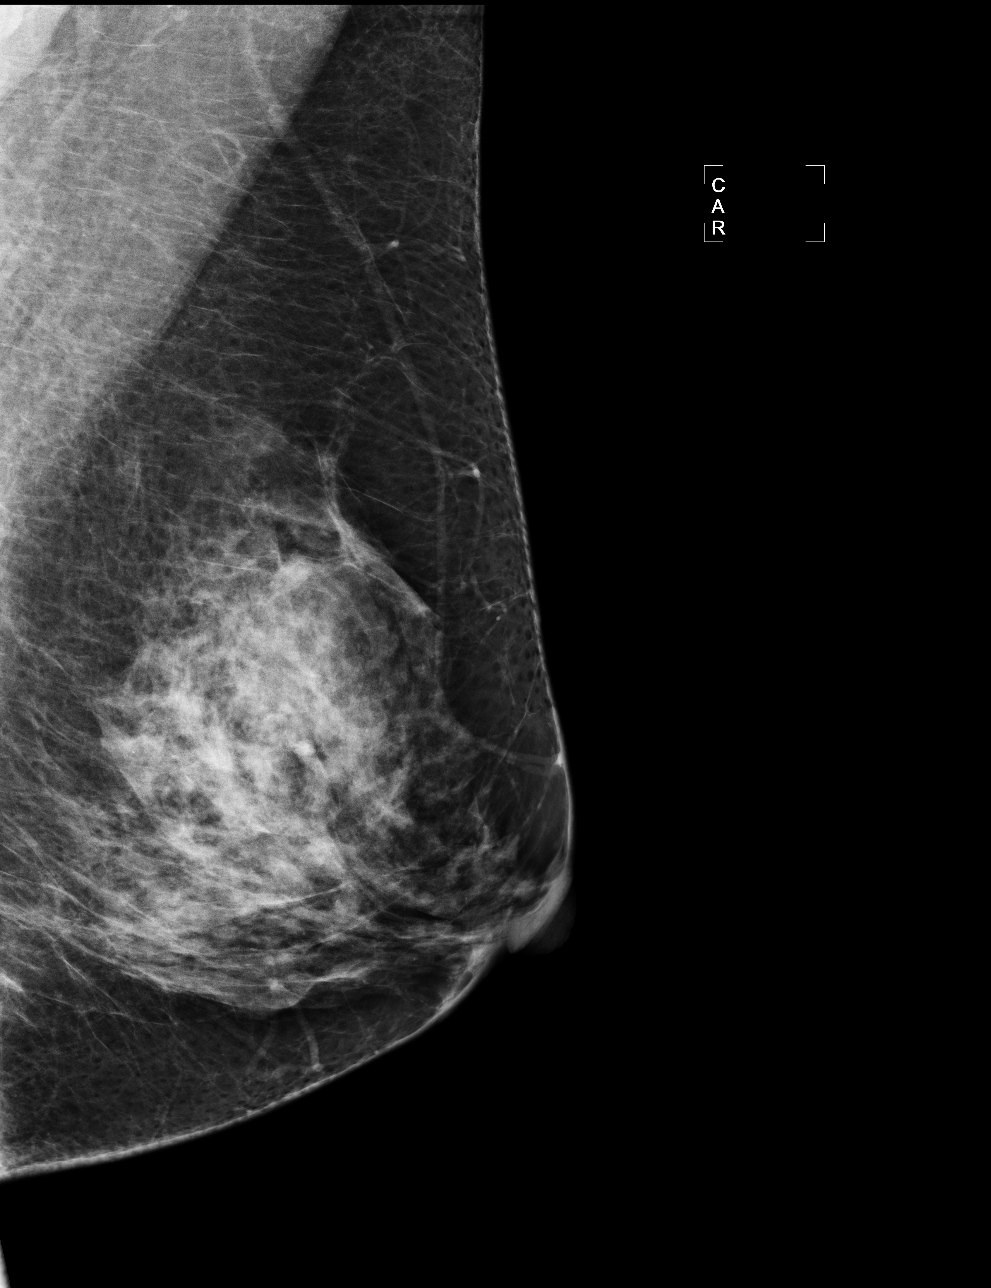

[R MLO]
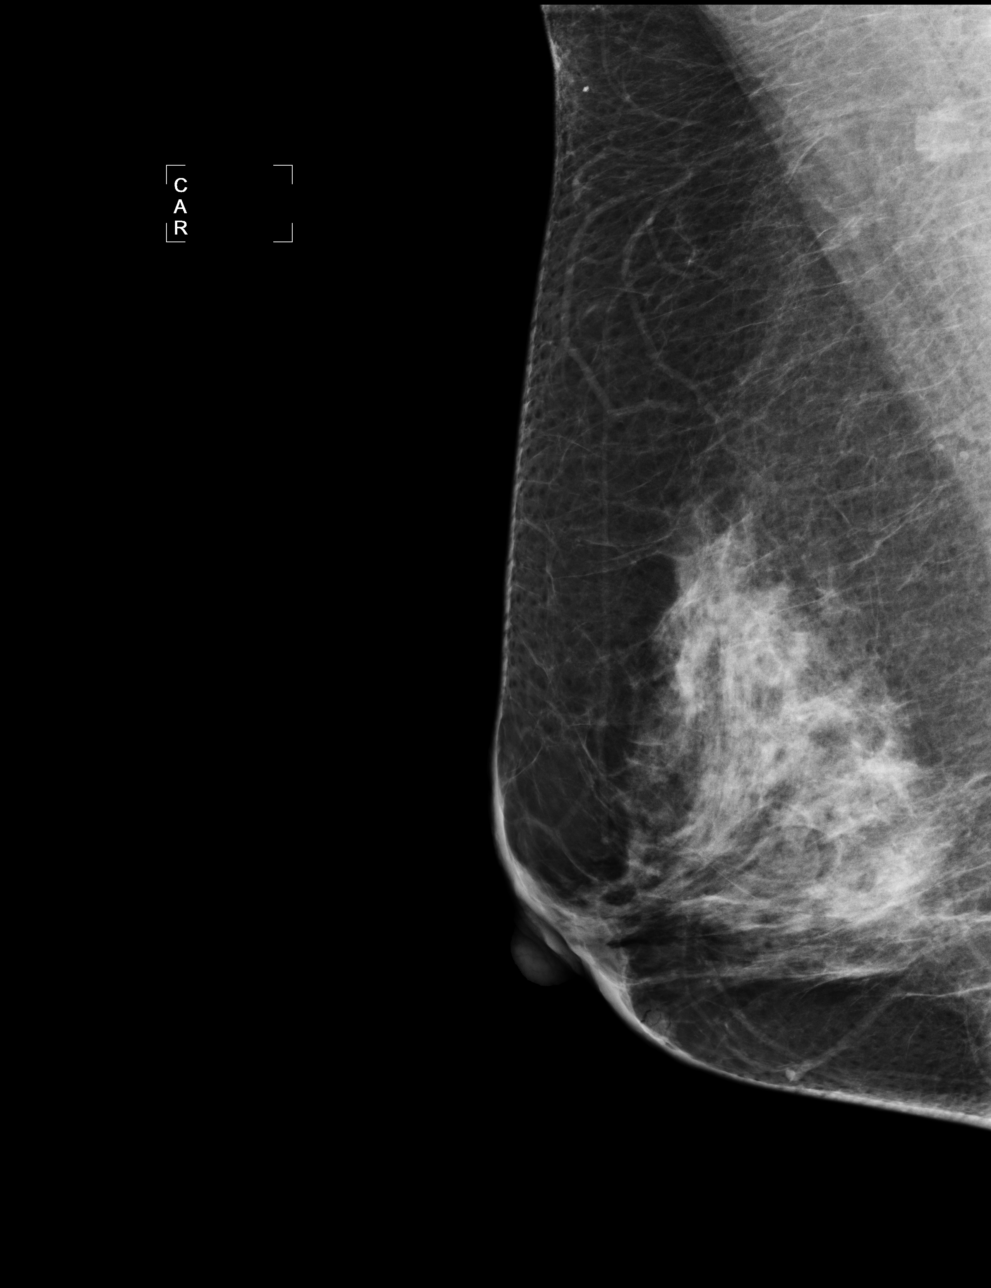

[4 of 4 positions shown; findings below may reference images not displayed]

ACR Breast Density Category c: The breasts are heterogeneously
dense, which may obscure small masses.
FINDINGS: No concerning masses, calcifications or nonsurgical architectural
distortion.

Mammographic images were processed with CAD.

On physical exam I palpated no discrete mass within the left breast.

Ultrasound is performed, showing a 0.7 x 0.5 x 0.7 cm oval
hypoechoic mass within the left breast 2 o'clock position. This is
unchanged in size when compared to prior examinations including
[DATE] where it measured 1.0 x 0.8 x 0.8 cm.
IMPRESSION: Mass within the left breast has been stable for 2 years, compatible
with a benign etiology. No mammographic evidence for malignancy.

RECOMMENDATION:
Screening mammogram in one year.(Code:[WK])

I have discussed the findings and recommendations with the patient.
Results were also provided in writing at the conclusion of the
visit. If applicable, a reminder letter will be sent to the patient
regarding the next appointment.

BI-RADS CATEGORY  2: Benign Finding(s)

## 2013-07-29 IMAGING — US US BREAST*L*
1 series · 4 of 4 positions shown · non-contrast
Comparison: Priors including [DATE]

CLINICAL DATA: Patient for 2 year followup of left breast mass

EXAM:
DIGITAL DIAGNOSTIC BILATERAL MAMMOGRAM; ULTRASOUND LEFT BREAST

[Series 1: us breast*left* · 4 of 4 slices shown]
[im 1/4]
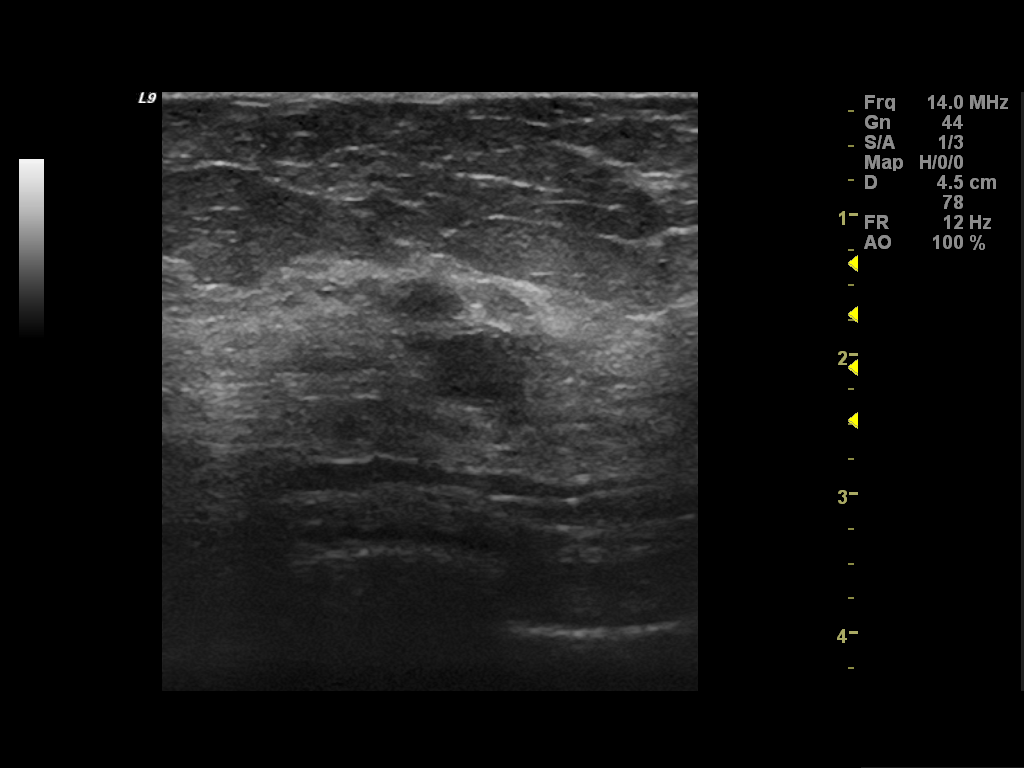
[im 2/4]
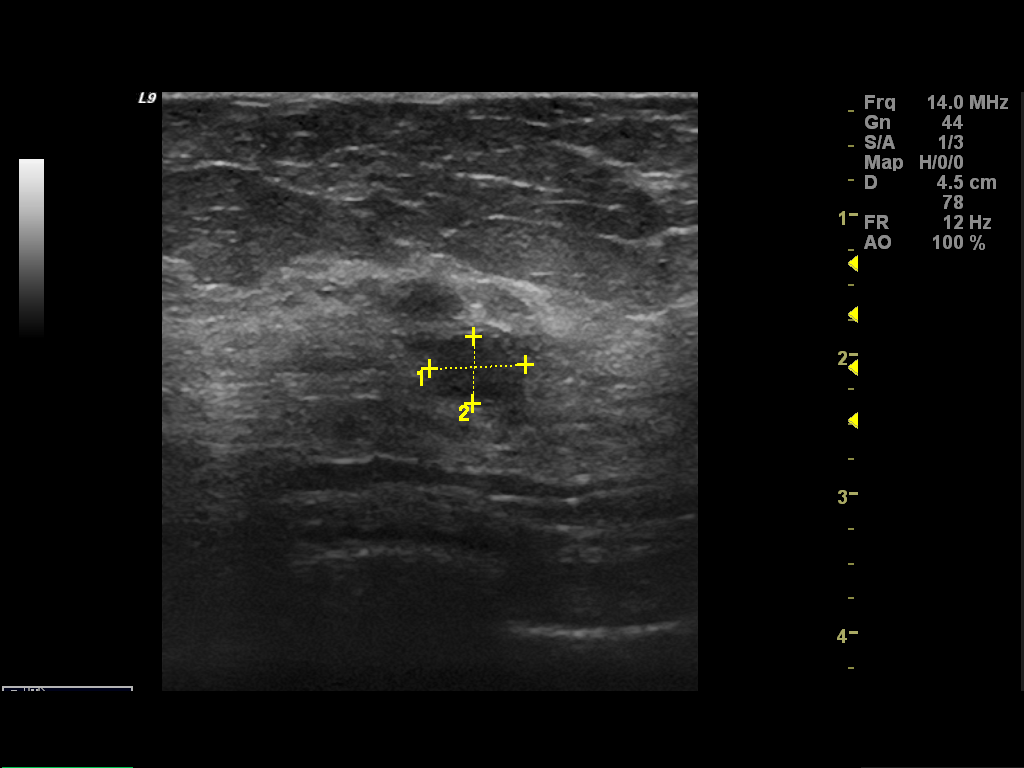
[im 3/4]
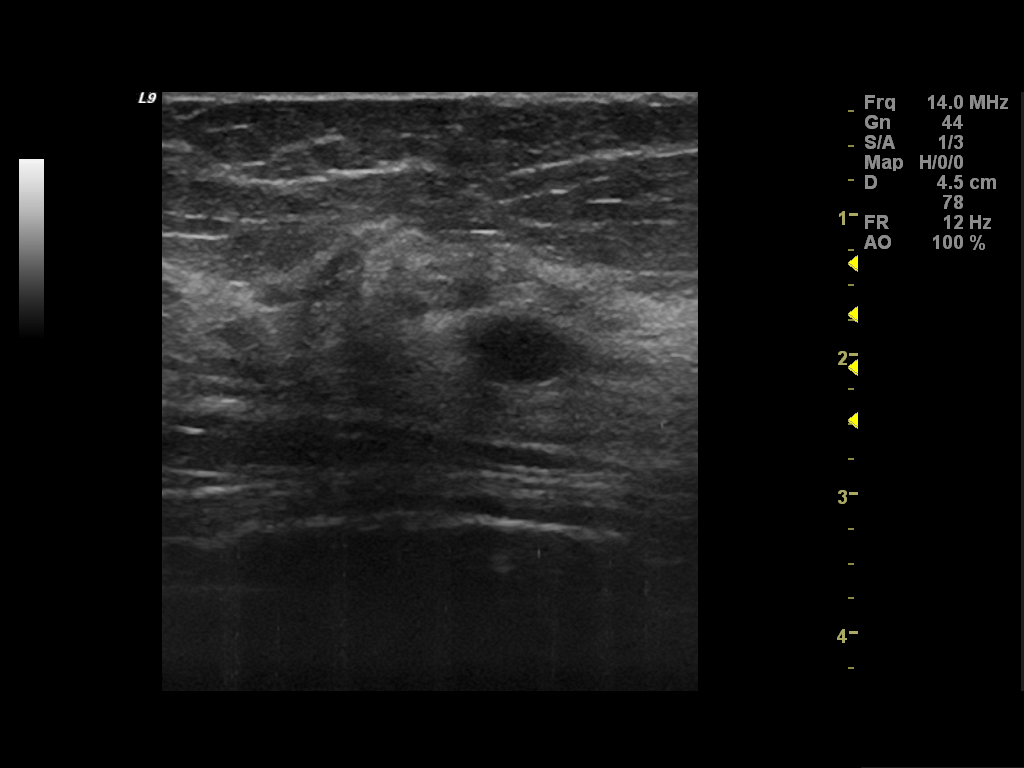
[im 4/4]
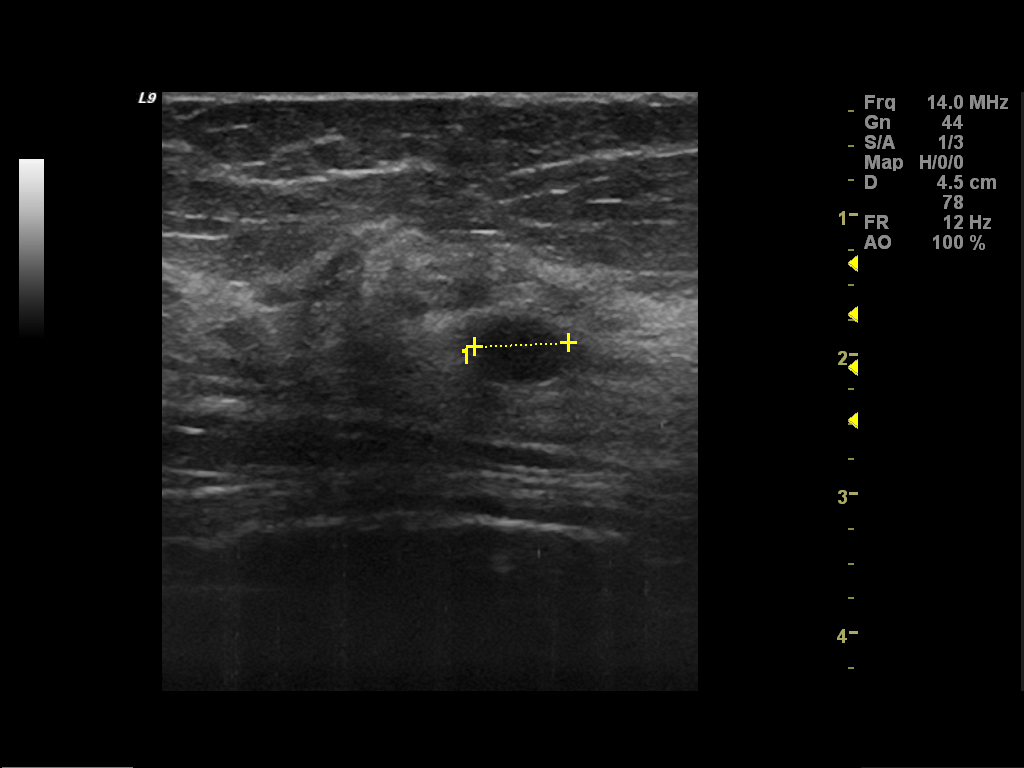

[4 of 4 positions shown; findings below may reference images not displayed]

ACR Breast Density Category c: The breasts are heterogeneously
dense, which may obscure small masses.
FINDINGS: No concerning masses, calcifications or nonsurgical architectural
distortion.

Mammographic images were processed with CAD.

On physical exam I palpated no discrete mass within the left breast.

Ultrasound is performed, showing a 0.7 x 0.5 x 0.7 cm oval
hypoechoic mass within the left breast 2 o'clock position. This is
unchanged in size when compared to prior examinations including
[DATE] where it measured 1.0 x 0.8 x 0.8 cm.
IMPRESSION: Mass within the left breast has been stable for 2 years, compatible
with a benign etiology. No mammographic evidence for malignancy.

RECOMMENDATION:
Screening mammogram in one year.(Code:[WK])

I have discussed the findings and recommendations with the patient.
Results were also provided in writing at the conclusion of the
visit. If applicable, a reminder letter will be sent to the patient
regarding the next appointment.

BI-RADS CATEGORY  2: Benign Finding(s)

## 2013-08-15 ENCOUNTER — Ambulatory Visit (INDEPENDENT_AMBULATORY_CARE_PROVIDER_SITE_OTHER): Payer: BC Managed Care – PPO | Admitting: Women's Health

## 2013-08-15 ENCOUNTER — Encounter: Payer: Self-pay | Admitting: Women's Health

## 2013-08-15 VITALS — BP 108/68 | Ht 65.5 in | Wt 161.0 lb

## 2013-08-15 DIAGNOSIS — M899 Disorder of bone, unspecified: Secondary | ICD-10-CM

## 2013-08-15 DIAGNOSIS — Z01419 Encounter for gynecological examination (general) (routine) without abnormal findings: Secondary | ICD-10-CM

## 2013-08-15 DIAGNOSIS — Z13220 Encounter for screening for lipoid disorders: Secondary | ICD-10-CM

## 2013-08-15 DIAGNOSIS — Z833 Family history of diabetes mellitus: Secondary | ICD-10-CM

## 2013-08-15 DIAGNOSIS — N951 Menopausal and female climacteric states: Secondary | ICD-10-CM

## 2013-08-15 DIAGNOSIS — E039 Hypothyroidism, unspecified: Secondary | ICD-10-CM

## 2013-08-15 DIAGNOSIS — Z780 Asymptomatic menopausal state: Secondary | ICD-10-CM

## 2013-08-15 DIAGNOSIS — M8580 Other specified disorders of bone density and structure, unspecified site: Secondary | ICD-10-CM

## 2013-08-15 LAB — CBC WITH DIFFERENTIAL/PLATELET
Basophils Absolute: 0 10*3/uL (ref 0.0–0.1)
Basophils Relative: 0 % (ref 0–1)
Eosinophils Absolute: 0.1 10*3/uL (ref 0.0–0.7)
Eosinophils Relative: 1 % (ref 0–5)
HCT: 43.9 % (ref 36.0–46.0)
Hemoglobin: 15 g/dL (ref 12.0–15.0)
Lymphocytes Relative: 35 % (ref 12–46)
Lymphs Abs: 1.6 10*3/uL (ref 0.7–4.0)
MCH: 28.7 pg (ref 26.0–34.0)
MCHC: 34.2 g/dL (ref 30.0–36.0)
MCV: 84.1 fL (ref 78.0–100.0)
Monocytes Absolute: 0.3 10*3/uL (ref 0.1–1.0)
Monocytes Relative: 5 % (ref 3–12)
Neutro Abs: 2.8 10*3/uL (ref 1.7–7.7)
Neutrophils Relative %: 59 % (ref 43–77)
Platelets: 287 10*3/uL (ref 150–400)
RBC: 5.22 MIL/uL — ABNORMAL HIGH (ref 3.87–5.11)
RDW: 14.3 % (ref 11.5–15.5)
WBC: 4.7 10*3/uL (ref 4.0–10.5)

## 2013-08-15 LAB — LIPID PANEL
Cholesterol: 231 mg/dL — ABNORMAL HIGH (ref 0–200)
HDL: 51 mg/dL (ref >39)
LDL Cholesterol: 148 mg/dL — ABNORMAL HIGH (ref 0–99)
Total CHOL/HDL Ratio: 4.5 Ratio
Triglycerides: 159 mg/dL — ABNORMAL HIGH (ref <150)
VLDL: 32 mg/dL (ref 0–40)

## 2013-08-15 LAB — GLUCOSE, RANDOM: Glucose, Bld: 88 mg/dL (ref 70–99)

## 2013-08-15 MED ORDER — ESTRADIOL 0.05 MG/24HR TD PTWK: 1.0000 | MEDICATED_PATCH | TRANSDERMAL | Status: DC

## 2013-08-15 NOTE — Patient Instructions (Signed)
Health Recommendations for Postmenopausal Women Respected and ongoing research has looked at the most common causes of death, disability, and poor quality of life in postmenopausal women. The causes include heart disease, diseases of blood vessels, diabetes, depression, cancer, and bone loss (osteoporosis). Many things can be done to help lower the chances of developing these and other common problems: CARDIOVASCULAR DISEASE Heart Disease: A heart attack is a medical emergency. Know the signs and symptoms of a heart attack. Below are things women can do to reduce their risk for heart disease.   Do not smoke. If you smoke, quit.  Aim for a healthy weight. Being overweight causes many preventable deaths. Eat a healthy and balanced diet and drink an adequate amount of liquids.  Get moving. Make a commitment to be more physically active. Aim for 30 minutes of activity on most, if not all days of the week.  Eat for heart health. Choose a diet that is low in saturated fat and cholesterol and eliminate trans fat. Include whole grains, vegetables, and fruits. Read and understand the labels on food containers before buying.  Know your numbers. Ask your caregiver to check your blood pressure, cholesterol (total, HDL, LDL, triglycerides) and blood glucose. Work with your caregiver on improving your entire clinical picture.  High blood pressure. Limit or stop your table salt intake (try salt substitute and food seasonings). Avoid salty foods and drinks. Read labels on food containers before buying. Eating well and exercising can help control high blood pressure. STROKE  Stroke is a medical emergency. Stroke may be the result of a blood clot in a blood vessel in the brain or by a brain hemorrhage (bleeding). Know the signs and symptoms of a stroke. To lower the risk of developing a stroke:  Avoid fatty foods.  Quit smoking.  Control your diabetes, blood pressure, and irregular heart rate. THROMBOPHLEBITIS  (BLOOD CLOT) OF THE LEG  Becoming overweight and leading a stationary lifestyle may also contribute to developing blood clots. Controlling your diet and exercising will help lower the risk of developing blood clots. CANCER SCREENING  Breast Cancer: Take steps to reduce your risk of breast cancer.  You should practice "breast self-awareness." This means understanding the normal appearance and feel of your breasts and should include breast self-examination. Any changes detected, no matter how small, should be reported to your caregiver.  After age 40, you should have a clinical breast exam (CBE) every year.  Starting at age 40, you should consider having a mammogram (breast X-ray) every year.  If you have a family history of breast cancer, talk to your caregiver about genetic screening.  If you are at high risk for breast cancer, talk to your caregiver about having an MRI and a mammogram every year.  Intestinal or Stomach Cancer: Tests to consider are a rectal exam, fecal occult blood, sigmoidoscopy, and colonoscopy. Women who are high risk may need to be screened at an earlier age and more often.  Cervical Cancer:  Beginning at age 30, you should have a Pap test every 3 years as long as the past 3 Pap tests have been normal.  If you have had past treatment for cervical cancer or a condition that could lead to cancer, you need Pap tests and screening for cancer for at least 20 years after your treatment.  If you had a hysterectomy for a problem that was not cancer or a condition that could lead to cancer, then you no longer need Pap tests.    If you are between ages 65 and 70, and you have had normal Pap tests going back 10 years, you no longer need Pap tests.  If Pap tests have been discontinued, risk factors (such as a new sexual partner) need to be reassessed to determine if screening should be resumed.  Some medical problems can increase the chance of getting cervical cancer. In these  cases, your caregiver may recommend more frequent screening and Pap tests.  Uterine Cancer: If you have vaginal bleeding after reaching menopause, you should notify your caregiver.  Ovarian cancer: Other than yearly pelvic exams, there are no reliable tests available to screen for ovarian cancer at this time except for yearly pelvic exams.  Lung Cancer: Yearly chest X-rays can detect lung cancer and should be done on high risk women, such as cigarette smokers and women with chronic lung disease (emphysema).  Skin Cancer: A complete body skin exam should be done at your yearly examination. Avoid overexposure to the sun and ultraviolet light lamps. Use a strong sun block cream when in the sun. All of these things are important in lowering the risk of skin cancer. MENOPAUSE Menopause Symptoms: Hormone therapy products are effective for treating symptoms associated with menopause:  Moderate to severe hot flashes.  Night sweats.  Mood swings.  Headaches.  Tiredness.  Loss of sex drive.  Insomnia.  Other symptoms. Hormone replacement carries certain risks, especially in older women. Women who use or are thinking about using estrogen or estrogen with progestin treatments should discuss that with their caregiver. Your caregiver will help you understand the benefits and risks. The ideal dose of hormone replacement therapy is not known. The Food and Drug Administration (FDA) has concluded that hormone therapy should be used only at the lowest doses and for the shortest amount of time to reach treatment goals.  OSTEOPOROSIS Protecting Against Bone Loss and Preventing Fracture: If you use hormone therapy for prevention of bone loss (osteoporosis), the risks for bone loss must outweigh the risk of the therapy. Ask your caregiver about other medications known to be safe and effective for preventing bone loss and fractures. To guard against bone loss or fractures, the following is recommended:  If  you are less than age 50, take 1000 mg of calcium and at least 600 mg of Vitamin D per day.  If you are greater than age 50 but less than age 70, take 1200 mg of calcium and at least 600 mg of Vitamin D per day.  If you are greater than age 70, take 1200 mg of calcium and at least 800 mg of Vitamin D per day. Smoking and excessive alcohol intake increases the risk of osteoporosis. Eat foods rich in calcium and vitamin D and do weight bearing exercises several times a week as your caregiver suggests. DIABETES Diabetes Melitus: If you have Type I or Type 2 diabetes, you should keep your blood sugar under control with diet, exercise and recommended medication. Avoid too many sweets, starchy and fatty foods. Being overweight can make control more difficult. COGNITION AND MEMORY Cognition and Memory: Menopausal hormone therapy is not recommended for the prevention of cognitive disorders such as Alzheimer's disease or memory loss.  DEPRESSION  Depression may occur at any age, but is common in elderly women. The reasons may be because of physical, medical, social (loneliness), or financial problems and needs. If you are experiencing depression because of medical problems and control of symptoms, talk to your caregiver about this. Physical activity and   exercise may help with mood and sleep. Community and volunteer involvement may help your sense of value and worth. If you have depression and you feel that the problem is getting worse or becoming severe, talk to your caregiver about treatment options that are best for you. ACCIDENTS  Accidents are common and can be serious in the elderly woman. Prepare your house to prevent accidents. Eliminate throw rugs, place hand bars in the bath, shower and toilet areas. Avoid wearing high heeled shoes or walking on wet, snowy, and icy areas. Limit or stop driving if you have vision or hearing problems, or you feel you are unsteady with you movements and  reflexes. HEPATITIS C Hepatitis C is a type of viral infection affecting the liver. It is spread mainly through contact with blood from an infected person. It can be treated, but if left untreated, it can lead to severe liver damage over years. Many people who are infected do not know that the virus is in their blood. If you are a "baby-boomer", it is recommended that you have one screening test for Hepatitis C. IMMUNIZATIONS  Several immunizations are important to consider having during your senior years, including:   Tetanus, diptheria, and pertussis booster shot.  Influenza every year before the flu season begins.  Pneumonia vaccine.  Shingles vaccine.  Others as indicated based on your specific needs. Talk to your caregiver about these. Document Released: 11/24/2005 Document Revised: 09/18/2012 Document Reviewed: 07/20/2008 Exit[employer] Patient Information 2014 [employer].  

## 2013-08-15 NOTE — Progress Notes (Signed)
Sharon Kirk 07-27-1961 [id number]    History:    The patient presents for annual exam.  Postmenopausal on estradiol 0.05 patch weekly with good relief of symptoms. TVH 91 for endometriosis and menorrhagia.  Normal Paps and mammograms. Breast ultrasound 2013 with repeat  stable to return to annual screening. 2013 DEXA T score -1 at left femoral neck.Negative colonoscopy 2010. Hypothyroid had been on no medications recently started on 50 mcg even days and 75 mcg 5 days of the week. She's had a negative ultrasound for nodules in 2011. Lipid panel 2013 cholesterol 236, LDL 161  Past medical history, past surgical history, family history and social history were all reviewed and documented in the EPIC chart. Works [employer]. Mother hypertension, brother diabetes.   ROS:  A  ROS was performed and pertinent positives and negatives are included in the history.  Exam:  Filed Vitals:   08/15/13 0811  BP: 108/68    General appearance:  Normal Head/Neck:  Normal, without cervical or supraclavicular adenopathy. Thyroid:  Symmetrical, normal in size, without palpable masses or nodularity. Respiratory  Effort:  Normal  Auscultation:  Clear without wheezing or rhonchi Cardiovascular  Auscultation:  Regular rate, without rubs, murmurs or gallops  Edema/varicosities:  Not grossly evident Abdominal  Soft,nontender, without masses, guarding or rebound.  Liver/spleen:  No organomegaly noted  Hernia:  None appreciated  Skin  Inspection:  Grossly normal  Palpation:  Grossly normal Neurologic/psychiatric  Orientation:  Normal with appropriate conversation.  Mood/affect:  Normal  Genitourinary    Breasts: Examined lying and sitting.     Right: Without masses, retractions, discharge or axillary adenopathy.     Left: Without masses, retractions, discharge or axillary adenopathy.   Inguinal/mons:  Normal without inguinal adenopathy  External genitalia:  Normal  BUS/Urethra/Skene's glands:   Normal  Bladder:  Normal  Vagina:  Normal  Cervix:  Absent  Adnexa/parametria:     Rt: Without masses or tenderness.   Lt: Without masses or tenderness.  Anus and perineum: Normal  Digital rectal exam: Normal sphincter tone without palpated masses or tenderness  Assessment/Plan:  52 y.o. M. WF G0 for annual exam with no complaints.  Postmenopausal TVH on estradiol patch Elevated cholesterol Hypothyroid recent start on Synthroid managed Dr. Kemper Durie  Plan: Estradiol patch 0.05 weekly, prescription, proper use, risk for blood clots, strokes, breast cancer reviewed. States has numerous hot flushes when off. SBE's, continue annual mammogram, increase regular exercise, decrease saturated fat, Fish oil supplement daily, vitamin D 2000 daily. Reviewed importance of regular exercise, home safety and fall prevention discussed. Reviewed if lipid panel remains elevated refer to primary care for possible medication.   Harrington [facility], 8:53 AM 08/15/2013

## 2013-08-16 LAB — URINALYSIS W MICROSCOPIC + REFLEX CULTURE
Bilirubin Urine: NEGATIVE
Casts: NONE SEEN
Crystals: NONE SEEN
Glucose, UA: NEGATIVE mg/dL
Hgb urine dipstick: NEGATIVE
Ketones, ur: NEGATIVE mg/dL
Leukocytes, UA: NEGATIVE
Nitrite: NEGATIVE
Protein, ur: NEGATIVE mg/dL
Specific Gravity, Urine: 1.019 (ref 1.005–1.030)
Urobilinogen, UA: 0.2 mg/dL (ref 0.0–1.0)
pH: 8 (ref 5.0–8.0)

## 2013-08-18 ENCOUNTER — Other Ambulatory Visit: Payer: Self-pay | Admitting: Women's Health

## 2013-08-18 DIAGNOSIS — E7800 Pure hypercholesterolemia, unspecified: Secondary | ICD-10-CM

## 2013-08-18 LAB — URINE CULTURE: Colony Count: >=100000

## 2013-08-18 MED ORDER — SULFAMETHOXAZOLE-TMP DS 800-160 MG PO TABS: 1.0000 | ORAL_TABLET | Freq: Two times a day (BID) | ORAL | Status: DC

## 2013-08-21 ENCOUNTER — Other Ambulatory Visit: Payer: Self-pay

## 2013-08-29 ENCOUNTER — Other Ambulatory Visit: Payer: Self-pay | Admitting: Women's Health

## 2013-08-29 DIAGNOSIS — N390 Urinary tract infection, site not specified: Secondary | ICD-10-CM

## 2013-09-05 ENCOUNTER — Other Ambulatory Visit: Payer: BC Managed Care – PPO

## 2013-09-05 DIAGNOSIS — N390 Urinary tract infection, site not specified: Secondary | ICD-10-CM

## 2013-09-05 LAB — URINALYSIS W MICROSCOPIC + REFLEX CULTURE
Bacteria, UA: NONE SEEN
Bilirubin Urine: NEGATIVE
Casts: NONE SEEN
Crystals: NONE SEEN
Glucose, UA: NEGATIVE mg/dL
Hgb urine dipstick: NEGATIVE
Ketones, ur: NEGATIVE mg/dL
Leukocytes, UA: NEGATIVE
Nitrite: NEGATIVE
Protein, ur: NEGATIVE mg/dL
RBC / HPF: NONE SEEN RBC/hpf (ref <3)
Specific Gravity, Urine: 1.025 (ref 1.005–1.030)
Urobilinogen, UA: 0.2 mg/dL (ref 0.0–1.0)
WBC, UA: NONE SEEN WBC/hpf (ref <3)
pH: 6.5 (ref 5.0–8.0)

## 2013-11-12 ENCOUNTER — Other Ambulatory Visit: Payer: BC Managed Care – PPO

## 2013-11-12 DIAGNOSIS — E7800 Pure hypercholesterolemia, unspecified: Secondary | ICD-10-CM

## 2013-11-12 LAB — LIPID PANEL
Cholesterol: 220 mg/dL — ABNORMAL HIGH (ref 0–200)
HDL: 51 mg/dL (ref >39)
LDL Cholesterol: 145 mg/dL — ABNORMAL HIGH (ref 0–99)
Total CHOL/HDL Ratio: 4.3 Ratio
Triglycerides: 122 mg/dL (ref <150)
VLDL: 24 mg/dL (ref 0–40)

## 2014-01-01 ENCOUNTER — Ambulatory Visit (INDEPENDENT_AMBULATORY_CARE_PROVIDER_SITE_OTHER): Payer: BC Managed Care – PPO | Admitting: Emergency Medicine

## 2014-01-01 VITALS — BP 120/80 | HR 85 | Temp 97.8°F | Resp 16 | Ht 65.5 in | Wt 163.0 lb

## 2014-01-01 DIAGNOSIS — J029 Acute pharyngitis, unspecified: Secondary | ICD-10-CM

## 2014-01-01 DIAGNOSIS — R6889 Other general symptoms and signs: Secondary | ICD-10-CM

## 2014-01-01 LAB — POCT CBC
Granulocyte percent: 78.4 %G (ref 37–80)
HCT, POC: 45.7 % (ref 37.7–47.9)
Hemoglobin: 14.6 g/dL (ref 12.2–16.2)
Lymph, poc: 0.8 (ref 0.6–3.4)
MCH, POC: 28.3 pg (ref 27–31.2)
MCHC: 31.9 g/dL (ref 31.8–35.4)
MCV: 88.8 fL (ref 80–97)
MID (cbc): 0.3 (ref 0–0.9)
MPV: 8.3 fL (ref 0–99.8)
POC Granulocyte: 4 (ref 2–6.9)
POC LYMPH PERCENT: 14.8 %L (ref 10–50)
POC MID %: 6.8 %M (ref 0–12)
Platelet Count, POC: 240 10*3/uL (ref 142–424)
RBC: 5.15 M/uL (ref 4.04–5.48)
RDW, POC: 14.9 %
WBC: 5.1 10*3/uL (ref 4.6–10.2)

## 2014-01-01 LAB — POCT INFLUENZA A/B
Influenza A, POC: NEGATIVE
Influenza B, POC: NEGATIVE

## 2014-01-01 LAB — POCT RAPID STREP A (OFFICE): Rapid Strep A Screen: NEGATIVE

## 2014-01-01 MED ORDER — FIRST-DUKES MOUTHWASH MT SUSP: OROMUCOSAL | Status: DC

## 2014-01-01 NOTE — Progress Notes (Signed)
   Subjective:    Patient ID: Sharon Kirk, female    DOB: 16-Sep-1961, 53 y.o.   MRN: [id number]  HPI 53 year old female pt presents with cough, chills, body aches, congestion, and sore throat. It started Monday. The sore throat has progressively become worse; she can hardly swallow. Pt visited a rest home on Sunday. No flu shot this year. Pt has tried OTC medication and salt water gargle; nothing has seemed to help much.    Review of Systems  Constitutional: Positive for chills.  HENT: Positive for congestion, sore throat, trouble swallowing and voice change.   Respiratory: Positive for cough.        Objective:   Physical Exam patient is alert and cooperative she is nontoxic appearing. Her TMs were clear nose was slightly congested. She has had a tonsillectomy. There is mild redness of the posterior pharynx. She has a large nontender goiter her chest was clear to auscultation and percussion her heart was regular rate without murmurs.        Results for orders placed in visit on 01/01/14  POCT INFLUENZA A/B      Result Value Ref Range   Influenza A, POC Negative     Influenza B, POC Negative    POCT RAPID STREP A (OFFICE)      Result Value Ref Range   Rapid Strep A Screen Negative  Negative  POCT CBC      Result Value Ref Range   WBC 5.1  4.6 - 10.2 K/uL   Lymph, poc 0.8  0.6 - 3.4   POC LYMPH PERCENT 14.8  10 - 50 %L   MID (cbc) 0.3  0 - 0.9   POC MID % 6.8  0 - 12 %M   POC Granulocyte 4.0  2 - 6.9   Granulocyte percent 78.4  37 - 80 %G   RBC 5.15  4.04 - 5.48 M/uL   Hemoglobin 14.6  12.2 - 16.2 g/dL   HCT, POC 45.7  37.7 - 47.9 %   MCV 88.8  80 - 97 fL   MCH, POC 28.3  27 - 31.2 pg   MCHC 31.9  31.8 - 35.4 g/dL   RDW, POC 14.9     Platelet Count, POC 240  142 - 424 K/uL   MPV 8.3  0 - 99.8 fL    Assessment & Plan:  This appears to be a viral-type pharyngitis. We'll treat symptomatically for now. She is outside of the window for Flu  treatment.

## 2014-01-01 NOTE — Patient Instructions (Signed)
Sore Throat A sore throat is pain, burning, irritation, or scratchiness of the throat. There is often pain or tenderness when swallowing or talking. A sore throat may be accompanied by other symptoms, such as coughing, sneezing, fever, and swollen neck glands. A sore throat is often the first sign of another sickness, such as a cold, flu, strep throat, or mononucleosis (commonly known as mono). Most sore throats go away without medical treatment. CAUSES  The most common causes of a sore throat include:  A viral infection, such as a cold, flu, or mono.  A bacterial infection, such as strep throat, tonsillitis, or whooping cough.  Seasonal allergies.  Dryness in the air.  Irritants, such as smoke or pollution.  Gastroesophageal reflux disease (GERD). HOME CARE INSTRUCTIONS   Only take over-the-counter medicines as directed by your caregiver.  Drink enough fluids to keep your urine clear or pale yellow.  Rest as needed.  Try using throat sprays, lozenges, or sucking on hard candy to ease any pain (if older than 4 years or as directed).  Sip warm liquids, such as broth, herbal tea, or warm water with honey to relieve pain temporarily. You may also eat or drink cold or frozen liquids such as frozen ice pops.  Gargle with salt water (mix 1 tsp salt with 8 oz of water).  Do not smoke and avoid secondhand smoke.  Put a cool-mist humidifier in your bedroom at night to moisten the air. You can also turn on a hot shower and sit in the bathroom with the door closed for 5 10 minutes. SEEK IMMEDIATE MEDICAL CARE IF:  You have difficulty breathing.  You are unable to swallow fluids, soft foods, or your saliva.  You have increased swelling in the throat.  Your sore throat does not get better in 7 days.  You have nausea and vomiting.  You have a fever or persistent symptoms for more than 2 3 days.  You have a fever and your symptoms suddenly get worse. MAKE SURE YOU:   Understand  these instructions.  Will watch your condition.  Will get help right away if you are not doing well or get worse. Document Released: 11/09/2004 Document Revised: 09/18/2012 Document Reviewed: 06/09/2012 Exit[employer] Patient Information 2014 [employer].  

## 2014-01-03 ENCOUNTER — Telehealth: Payer: Self-pay

## 2014-01-03 LAB — CULTURE, GROUP A STREP: Organism ID, Bacteria: NORMAL

## 2014-01-03 NOTE — Telephone Encounter (Signed)
Patient called stated she seen Dr. Everlene Farrier on Thursday around 8:30, Patient states she is not feeling better. Complains of aches and pains, dry cough, ears throbbing. Patient request for Dr. Everlene Farrier to call in an antibiotic at [facility] on [address] [phone].

## 2014-01-04 NOTE — Telephone Encounter (Signed)
Patient notified and voiced understanding.

## 2014-01-04 NOTE — Telephone Encounter (Signed)
Dr. Perfecto Kingdom note indicates that pt has a viral illness.  Antibiotics will not be effective.  Recommend ibuprofen and/or tylenol or aches and pains.  Lots of water.  Continue Mucinex DM.  If she feels like she is worsening, she should come back in

## 2014-01-04 NOTE — Telephone Encounter (Signed)
The patient called again to state she is not feeling better, and now has a lot of mucus in her chest.  The patient stated she is taking Mucinex DM with little relief.  The patient requests return call and an antibiotic called in to the [facility] on [address].  The patient requests return call at [phone].

## 2014-06-02 ENCOUNTER — Other Ambulatory Visit: Payer: Self-pay

## 2014-06-02 DIAGNOSIS — Z1231 Encounter for screening mammogram for malignant neoplasm of breast: Secondary | ICD-10-CM

## 2014-07-30 ENCOUNTER — Ambulatory Visit
Admission: RE | Admit: 2014-07-30 | Discharge: 2014-07-30 | Disposition: A | Discharge status: Home / Self Care | Payer: BC Managed Care – PPO | Source: Ambulatory Visit

## 2014-07-30 ENCOUNTER — Encounter: Payer: Self-pay | Admitting: Women's Health

## 2014-07-30 DIAGNOSIS — Z1231 Encounter for screening mammogram for malignant neoplasm of breast: Secondary | ICD-10-CM

## 2014-08-20 ENCOUNTER — Ambulatory Visit (INDEPENDENT_AMBULATORY_CARE_PROVIDER_SITE_OTHER): Payer: BC Managed Care – PPO | Admitting: Women's Health

## 2014-08-20 ENCOUNTER — Encounter: Payer: Self-pay | Admitting: Women's Health

## 2014-08-20 VITALS — BP 110/80 | Ht 66.0 in | Wt 158.0 lb

## 2014-08-20 DIAGNOSIS — Z780 Asymptomatic menopausal state: Secondary | ICD-10-CM

## 2014-08-20 DIAGNOSIS — Z833 Family history of diabetes mellitus: Secondary | ICD-10-CM

## 2014-08-20 DIAGNOSIS — Z01419 Encounter for gynecological examination (general) (routine) without abnormal findings: Secondary | ICD-10-CM

## 2014-08-20 DIAGNOSIS — Z13220 Encounter for screening for lipoid disorders: Secondary | ICD-10-CM

## 2014-08-20 LAB — CBC WITH DIFFERENTIAL/PLATELET
Basophils Absolute: 0 10*3/uL (ref 0.0–0.1)
Basophils Relative: 0 % (ref 0–1)
Eosinophils Absolute: 0 10*3/uL (ref 0.0–0.7)
Eosinophils Relative: 1 % (ref 0–5)
HCT: 41.8 % (ref 36.0–46.0)
Hemoglobin: 14 g/dL (ref 12.0–15.0)
Lymphocytes Relative: 35 % (ref 12–46)
Lymphs Abs: 1.5 10*3/uL (ref 0.7–4.0)
MCH: 28.2 pg (ref 26.0–34.0)
MCHC: 33.5 g/dL (ref 30.0–36.0)
MCV: 84.3 fL (ref 78.0–100.0)
Monocytes Absolute: 0.3 10*3/uL (ref 0.1–1.0)
Monocytes Relative: 6 % (ref 3–12)
Neutro Abs: 2.5 10*3/uL (ref 1.7–7.7)
Neutrophils Relative %: 58 % (ref 43–77)
Platelets: 246 10*3/uL (ref 150–400)
RBC: 4.96 MIL/uL (ref 3.87–5.11)
RDW: 14.3 % (ref 11.5–15.5)
WBC: 4.3 10*3/uL (ref 4.0–10.5)

## 2014-08-20 LAB — LIPID PANEL
Cholesterol: 203 mg/dL — ABNORMAL HIGH (ref 0–200)
HDL: 56 mg/dL (ref >39)
LDL Cholesterol: 130 mg/dL — ABNORMAL HIGH (ref 0–99)
Total CHOL/HDL Ratio: 3.6 Ratio
Triglycerides: 84 mg/dL (ref <150)
VLDL: 17 mg/dL (ref 0–40)

## 2014-08-20 LAB — GLUCOSE, RANDOM: Glucose, Bld: 86 mg/dL (ref 70–99)

## 2014-08-20 MED ORDER — ESTRADIOL 0.05 MG/24HR TD PTWK: 0.0500 mg | MEDICATED_PATCH | TRANSDERMAL | Status: DC

## 2014-08-20 NOTE — Progress Notes (Signed)
Sharon Kirk 1961/06/29 [id number]    History:    Presents for annual exam.  TVH for menorrhagia and endometriosis on Climara patch with occasional hot flushes. Normal Pap and mammogram history. 2010 normal negative colonoscopy. 2013  DEXA Tscore -1. Numerous moles, has an annual skin check Dr. Allyson Sabal.  Past medical history, past surgical history, family history and social history were all reviewed and documented in the EPIC chart. Works at [employer]. Mother hypertension. Brother diabetes.  ROS:  A  12 point ROS was performed and pertinent positives and negatives are included.  Exam:  Filed Vitals:   08/20/14 0800  BP: 110/80    General appearance:  Normal Thyroid:  Symmetrical, normal in size, without palpable masses or nodularity. Respiratory  Auscultation:  Clear without wheezing or rhonchi Cardiovascular  Auscultation:  Regular rate, without rubs, murmurs or gallops  Edema/varicosities:  Not grossly evident Abdominal  Soft,nontender, without masses, guarding or rebound.  Liver/spleen:  No organomegaly noted  Hernia:  None appreciated  Skin  Inspection:  Grossly normal   Breasts: Examined lying and sitting.     Right: Without masses, retractions, discharge or axillary adenopathy.     Left: Without masses, retractions, discharge or axillary adenopathy. Gentitourinary   Inguinal/mons:  Normal without inguinal adenopathy  External genitalia:  Normal  BUS/Urethra/Skene's glands:  Normal  Vagina:  Normal  Cervix: absent  Uterus: absent  Adnexa/parametria:     Rt: Without masses or tenderness.   Lt: Without masses or tenderness.  Anus and perineum: Normal  Digital rectal exam: Normal sphincter tone without palpated masses or tenderness  Assessment/Plan:  53 y.o. MWF G0 for annual exam.     TVH on HRT with occasional hot flushes hypothyroid-primary care manages  Plan: Climara patch 0.05 weekly, prescription, proper use, risk for blood clots, strokes, breast cancer reviewed.  SBE's, continue annual mammogram 3-D tomography reviewed and encouraged history of dense breasts. Encouraged regular exercise and calcium rich diet, vitamin D 2000,  history of normal vitamin D level. Repeat DEXA next year, CBC, lipid panel, glucose, UA, health screening form completed for work.  Huel Cote [facility], 8:38 AM 08/20/2014

## 2014-08-20 NOTE — Patient Instructions (Signed)
Health Recommendations for Postmenopausal Women Respected and ongoing research has looked at the most common causes of death, disability, and poor quality of life in postmenopausal women. The causes include heart disease, diseases of blood vessels, diabetes, depression, cancer, and bone loss (osteoporosis). Many things can be done to help lower the chances of developing these and other common problems. CARDIOVASCULAR DISEASE Heart Disease: A heart attack is a medical emergency. Know the signs and symptoms of a heart attack. Below are things women can do to reduce their risk for heart disease.   Do not smoke. If you smoke, quit.  Aim for a healthy weight. Being overweight causes many preventable deaths. Eat a healthy and balanced diet and drink an adequate amount of liquids.  Get moving. Make a commitment to be more physically active. Aim for 30 minutes of activity on most, if not all days of the week.  Eat for heart health. Choose a diet that is low in saturated fat and cholesterol and eliminate trans fat. Include whole grains, vegetables, and fruits. Read and understand the labels on food containers before buying.  Know your numbers. Ask your caregiver to check your blood pressure, cholesterol (total, HDL, LDL, triglycerides) and blood glucose. Work with your caregiver on improving your entire clinical picture.  High blood pressure. Limit or stop your table salt intake (try salt substitute and food seasonings). Avoid salty foods and drinks. Read labels on food containers before buying. Eating well and exercising can help control high blood pressure. STROKE  Stroke is a medical emergency. Stroke may be the result of a blood clot in a blood vessel in the brain or by a brain hemorrhage (bleeding). Know the signs and symptoms of a stroke. To lower the risk of developing a stroke:  Avoid fatty foods.  Quit smoking.  Control your diabetes, blood pressure, and irregular heart rate. THROMBOPHLEBITIS  (BLOOD CLOT) OF THE LEG  Becoming overweight and leading a stationary lifestyle may also contribute to developing blood clots. Controlling your diet and exercising will help lower the risk of developing blood clots. CANCER SCREENING  Breast Cancer: Take steps to reduce your risk of breast cancer.  You should practice "breast self-awareness." This means understanding the normal appearance and feel of your breasts and should include breast self-examination. Any changes detected, no matter how small, should be reported to your caregiver.  After age 40, you should have a clinical breast exam (CBE) every year.  Starting at age 40, you should consider having a mammogram (breast X-ray) every year.  If you have a family history of breast cancer, talk to your caregiver about genetic screening.  If you are at high risk for breast cancer, talk to your caregiver about having an MRI and a mammogram every year.  Intestinal or Stomach Cancer: Tests to consider are a rectal exam, fecal occult blood, sigmoidoscopy, and colonoscopy. Women who are high risk may need to be screened at an earlier age and more often.  Cervical Cancer:  Beginning at age 30, you should have a Pap test every 3 years as long as the past 3 Pap tests have been normal.  If you have had past treatment for cervical cancer or a condition that could lead to cancer, you need Pap tests and screening for cancer for at least 20 years after your treatment.  If you had a hysterectomy for a problem that was not cancer or a condition that could lead to cancer, then you no longer need Pap tests.    If you are between ages 65 and 70, and you have had normal Pap tests going back 10 years, you no longer need Pap tests.  If Pap tests have been discontinued, risk factors (such as a new sexual partner) need to be reassessed to determine if screening should be resumed.  Some medical problems can increase the chance of getting cervical cancer. In these  cases, your caregiver may recommend more frequent screening and Pap tests.  Uterine Cancer: If you have vaginal bleeding after reaching menopause, you should notify your caregiver.  Ovarian Cancer: Other than yearly pelvic exams, there are no reliable tests available to screen for ovarian cancer at this time except for yearly pelvic exams.  Lung Cancer: Yearly chest X-rays can detect lung cancer and should be done on high risk women, such as cigarette smokers and women with chronic lung disease (emphysema).  Skin Cancer: A complete body skin exam should be done at your yearly examination. Avoid overexposure to the sun and ultraviolet light lamps. Use a strong sun block cream when in the sun. All of these things are important for lowering the risk of skin cancer. MENOPAUSE Menopause Symptoms: Hormone therapy products are effective for treating symptoms associated with menopause:  Moderate to severe hot flashes.  Night sweats.  Mood swings.  Headaches.  Tiredness.  Loss of sex drive.  Insomnia.  Other symptoms. Hormone replacement carries certain risks, especially in older women. Women who use or are thinking about using estrogen or estrogen with progestin treatments should discuss that with their caregiver. Your caregiver will help you understand the benefits and risks. The ideal dose of hormone replacement therapy is not known. The Food and Drug Administration (FDA) has concluded that hormone therapy should be used only at the lowest doses and for the shortest amount of time to reach treatment goals.  OSTEOPOROSIS Protecting Against Bone Loss and Preventing Fracture If you use hormone therapy for prevention of bone loss (osteoporosis), the risks for bone loss must outweigh the risk of the therapy. Ask your caregiver about other medications known to be safe and effective for preventing bone loss and fractures. To guard against bone loss or fractures, the following is recommended:  If  you are younger than age 50, take 1000 mg of calcium and at least 600 mg of Vitamin D per day.  If you are older than age 50 but younger than age 70, take 1200 mg of calcium and at least 600 mg of Vitamin D per day.  If you are older than age 70, take 1200 mg of calcium and at least 800 mg of Vitamin D per day. Smoking and excessive alcohol intake increases the risk of osteoporosis. Eat foods rich in calcium and vitamin D and do weight bearing exercises several times a week as your caregiver suggests. DIABETES Diabetes Mellitus: If you have type I or type 2 diabetes, you should keep your blood sugar under control with diet, exercise, and recommended medication. Avoid starchy and fatty foods, and too many sweets. Being overweight can make diabetes control more difficult. COGNITION AND MEMORY Cognition and Memory: Menopausal hormone therapy is not recommended for the prevention of cognitive disorders such as Alzheimer's disease or memory loss.  DEPRESSION  Depression may occur at any age, but it is common in elderly women. This may be because of physical, medical, social (loneliness), or financial problems and needs. If you are experiencing depression because of medical problems and control of symptoms, talk to your caregiver about this. Physical   activity and exercise may help with mood and sleep. Community and volunteer involvement may improve your sense of value and worth. If you have depression and you feel that the problem is getting worse or becoming severe, talk to your caregiver about which treatment options are best for you. ACCIDENTS  Accidents are common and can be serious in elderly woman. Prepare your house to prevent accidents. Eliminate throw rugs, place hand bars in bath, shower, and toilet areas. Avoid wearing high heeled shoes or walking on wet, snowy, and icy areas. Limit or stop driving if you have vision or hearing problems, or if you feel you are unsteady with your movements and  reflexes. HEPATITIS C Hepatitis C is a type of viral infection affecting the liver. It is spread mainly through contact with blood from an infected person. It can be treated, but if left untreated, it can lead to severe liver damage over the years. Many people who are infected do not know that the virus is in their blood. If you are a "baby-boomer", it is recommended that you have one screening test for Hepatitis C. IMMUNIZATIONS  Several immunizations are important to consider having during your senior years, including:   Tetanus, diphtheria, and pertussis booster shot.  Influenza every year before the flu season begins.  Pneumonia vaccine.  Shingles vaccine.  Others, as indicated based on your specific needs. Talk to your caregiver about these. Document Released: 11/24/2005 Document Revised: 02/16/2014 Document Reviewed: 07/20/2008 ExitCare Patient Information 2015 [employer]. This information is not intended to replace advice given to you by your health care provider. Make sure you discuss any questions you have with your health care provider.  

## 2014-08-21 ENCOUNTER — Other Ambulatory Visit: Payer: Self-pay | Admitting: Women's Health

## 2014-08-21 ENCOUNTER — Telehealth: Payer: Self-pay

## 2014-08-21 ENCOUNTER — Encounter: Payer: Self-pay | Admitting: Women's Health

## 2014-08-21 DIAGNOSIS — Z780 Asymptomatic menopausal state: Secondary | ICD-10-CM

## 2014-08-21 LAB — URINALYSIS W MICROSCOPIC + REFLEX CULTURE
Bilirubin Urine: NEGATIVE
Casts: NONE SEEN
Crystals: NONE SEEN
Glucose, UA: NEGATIVE mg/dL
Hgb urine dipstick: NEGATIVE
Ketones, ur: NEGATIVE mg/dL
Leukocytes, UA: NEGATIVE
Nitrite: NEGATIVE
Protein, ur: NEGATIVE mg/dL
Specific Gravity, Urine: 1.02 (ref 1.005–1.030)
Urobilinogen, UA: 0.2 mg/dL (ref 0.0–1.0)
pH: 7 (ref 5.0–8.0)

## 2014-08-21 MED ORDER — ESTRADIOL 0.05 MG/24HR TD PTWK: 0.0500 mg | MEDICATED_PATCH | TRANSDERMAL | Status: DC

## 2014-08-21 NOTE — Telephone Encounter (Signed)
Patient called to request that her est patch be sent to [facility] instead of [facility].  Rx reordered to [facility]. Patient informed.

## 2014-08-24 LAB — URINE CULTURE: Colony Count: >=100000

## 2014-08-25 ENCOUNTER — Other Ambulatory Visit: Payer: Self-pay | Admitting: Women's Health

## 2014-08-25 MED ORDER — NITROFURANTOIN MONOHYD MACRO 100 MG PO CAPS: 100.0000 mg | ORAL_CAPSULE | Freq: Two times a day (BID) | ORAL | Status: DC

## 2015-05-12 ENCOUNTER — Other Ambulatory Visit: Payer: Self-pay | Admitting: Internal Medicine

## 2015-05-12 DIAGNOSIS — E042 Nontoxic multinodular goiter: Secondary | ICD-10-CM

## 2015-05-18 ENCOUNTER — Ambulatory Visit (HOSPITAL_COMMUNITY)
Admission: RE | Admit: 2015-05-18 | Discharge: 2015-05-18 | Disposition: A | Discharge status: Home / Self Care | Payer: BLUE CROSS/BLUE SHIELD | Source: Ambulatory Visit | Attending: Internal Medicine | Admitting: Internal Medicine

## 2015-05-18 DIAGNOSIS — E042 Nontoxic multinodular goiter: Secondary | ICD-10-CM | POA: Insufficient documentation

## 2015-05-18 DIAGNOSIS — E039 Hypothyroidism, unspecified: Secondary | ICD-10-CM | POA: Diagnosis not present

## 2015-07-02 ENCOUNTER — Other Ambulatory Visit: Payer: Self-pay

## 2015-07-02 DIAGNOSIS — Z1231 Encounter for screening mammogram for malignant neoplasm of breast: Secondary | ICD-10-CM

## 2015-07-08 ENCOUNTER — Other Ambulatory Visit: Payer: Self-pay | Admitting: General Surgery

## 2015-07-08 ENCOUNTER — Other Ambulatory Visit: Payer: Self-pay

## 2015-07-08 DIAGNOSIS — E041 Nontoxic single thyroid nodule: Secondary | ICD-10-CM

## 2015-07-08 NOTE — Addendum Note (Signed)
Addended by: Odis Hollingshead on: 07/08/2015 02:22 PM   Modules accepted: Orders

## 2015-07-17 HISTORY — PX: THYROID SURGERY: SHX805

## 2015-08-05 ENCOUNTER — Ambulatory Visit
Admission: RE | Admit: 2015-08-05 | Discharge: 2015-08-05 | Disposition: A | Discharge status: Home / Self Care | Payer: BLUE CROSS/BLUE SHIELD | Source: Ambulatory Visit | Attending: General Surgery | Admitting: General Surgery

## 2015-08-05 ENCOUNTER — Other Ambulatory Visit (HOSPITAL_COMMUNITY)
Admission: RE | Admit: 2015-08-05 | Discharge: 2015-08-05 | Disposition: A | Discharge status: Home / Self Care | Payer: BLUE CROSS/BLUE SHIELD | Source: Ambulatory Visit | Attending: General Surgery | Admitting: General Surgery

## 2015-08-05 ENCOUNTER — Encounter: Payer: Self-pay | Admitting: Women's Health

## 2015-08-05 ENCOUNTER — Ambulatory Visit
Admission: RE | Admit: 2015-08-05 | Discharge: 2015-08-05 | Disposition: A | Discharge status: Home / Self Care | Payer: BLUE CROSS/BLUE SHIELD | Source: Ambulatory Visit

## 2015-08-05 DIAGNOSIS — E041 Nontoxic single thyroid nodule: Secondary | ICD-10-CM | POA: Insufficient documentation

## 2015-08-05 DIAGNOSIS — Z1231 Encounter for screening mammogram for malignant neoplasm of breast: Secondary | ICD-10-CM

## 2015-08-05 IMAGING — US US THYROID BIOPSY
1 series · 13 of 15 positions shown · non-contrast
Comparison: US soft tissue head/neck [DATE]

MEDICATIONS:
5 cc 1% lidocaine

COMPLICATIONS:
None immediate

INDICATION: Indeterminate thyroid nodule

L thyroid nodule 3.2 x 1.8 x 2.0 cm
EXAM:
ULTRASOUND GUIDED FINE NEEDLE ASPIRATION OF INDETERMINATE THYROID
NODULE
TECHNIQUE: Informed written consent was obtained from the patient after a
discussion of the risks, benefits and alternatives to treatment.
Questions regarding the procedure were encouraged and answered. A
timeout was performed prior to the initiation of the procedure.

[Series 1: us thyroid biopsy · 0.08mm/px · 15 acquisitions, 13 frames shown]
[im 1/15]
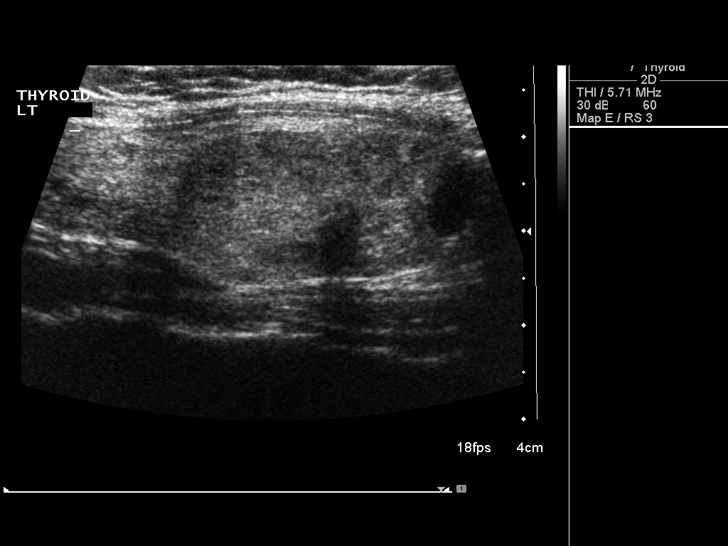
[im 2/15]
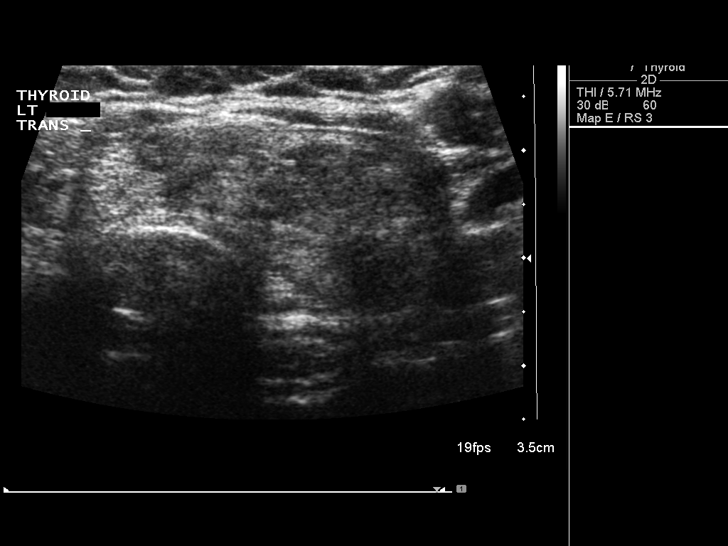
[im 3/15]
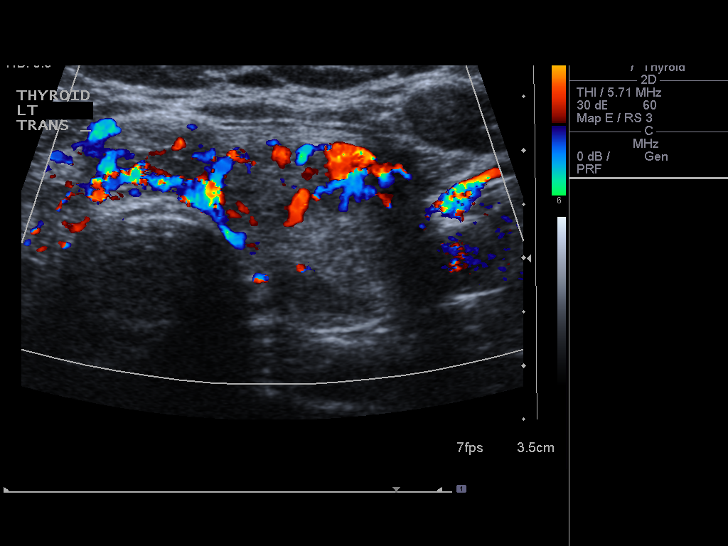
[im 5/15]
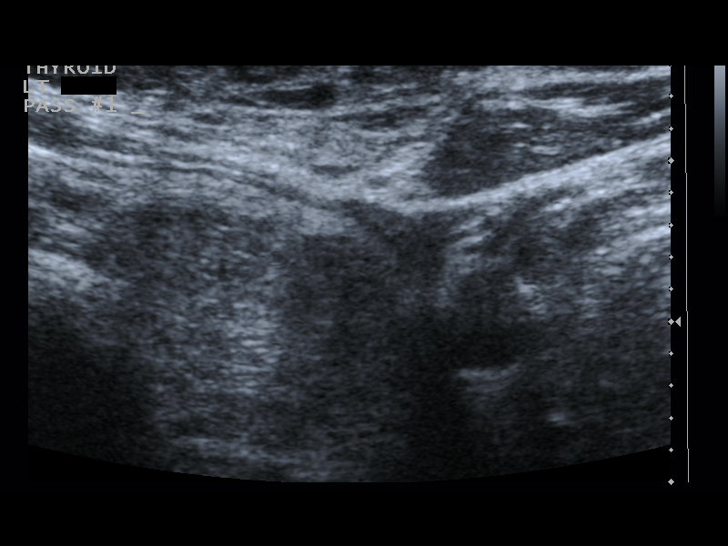
[im 6/15]
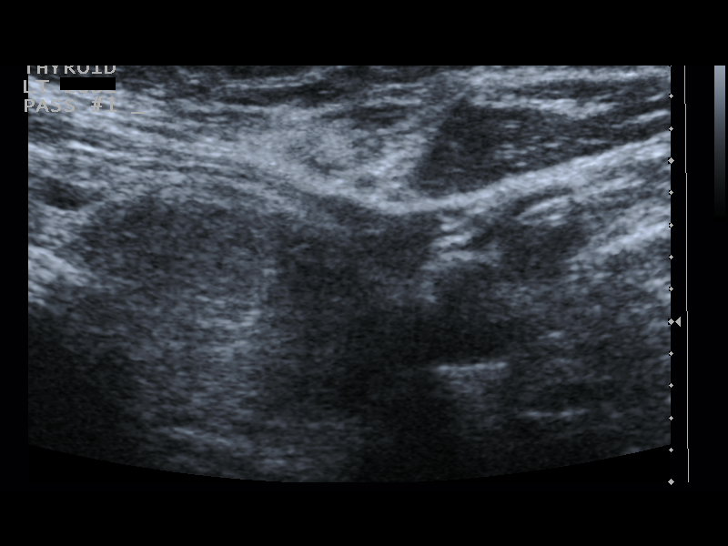
[im 7/15]
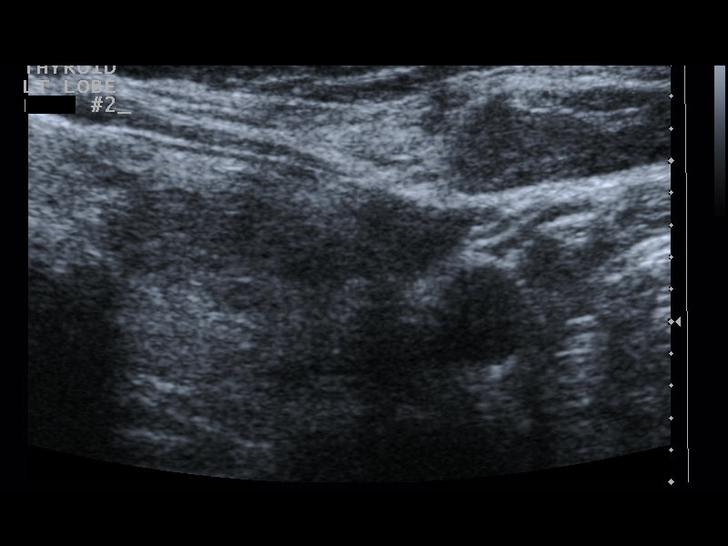
[im 8/15]
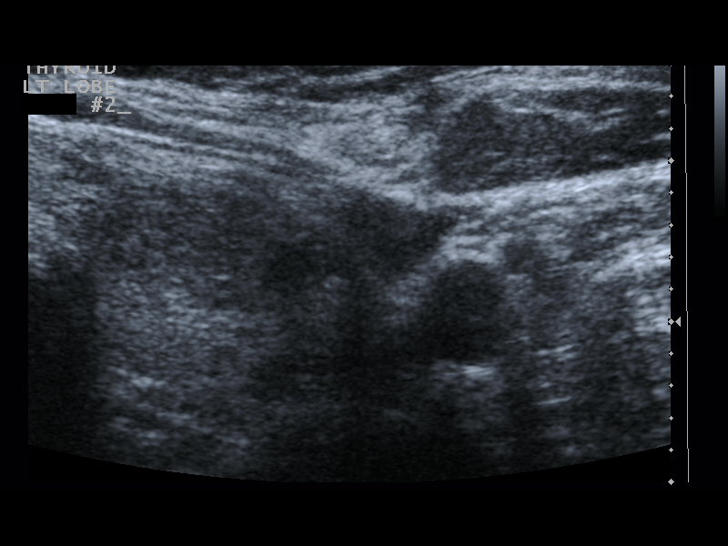
[im 9/15]
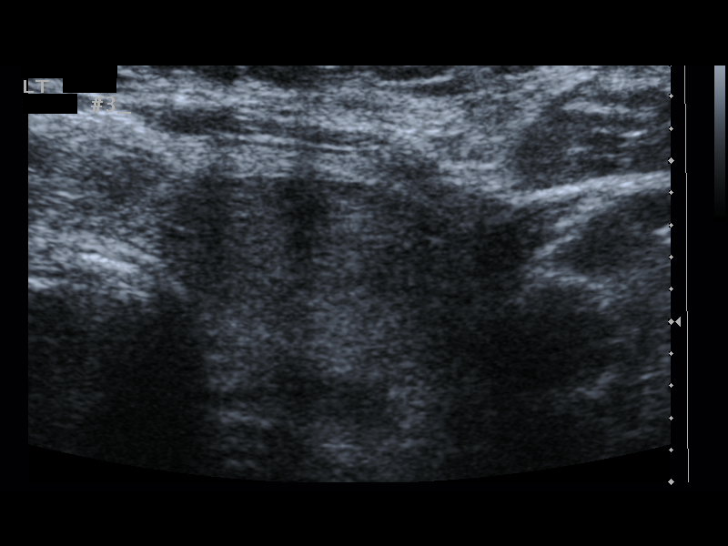
[im 10/15]
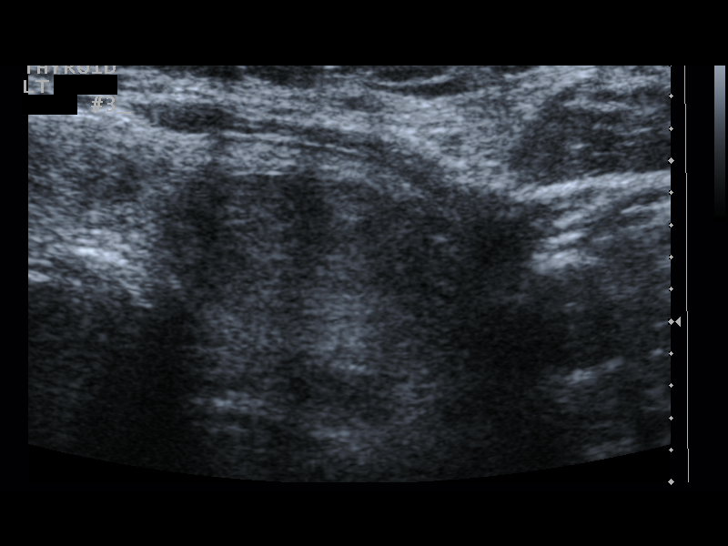
[im 11/15]
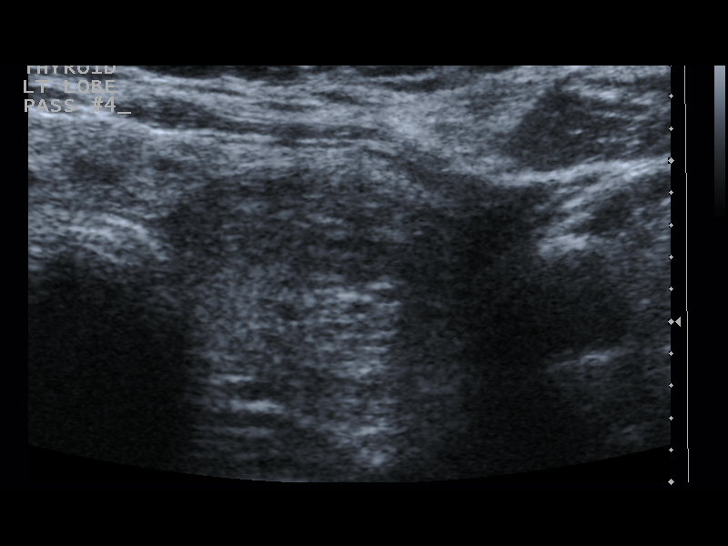
[im 13/15]
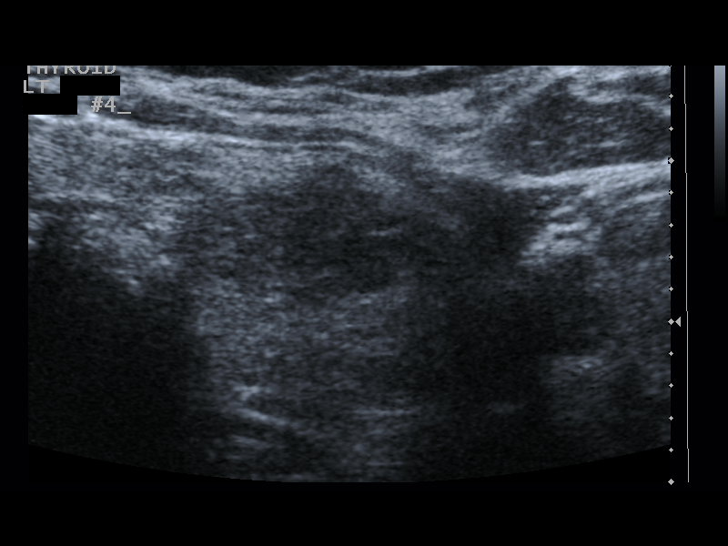
[im 14/15]
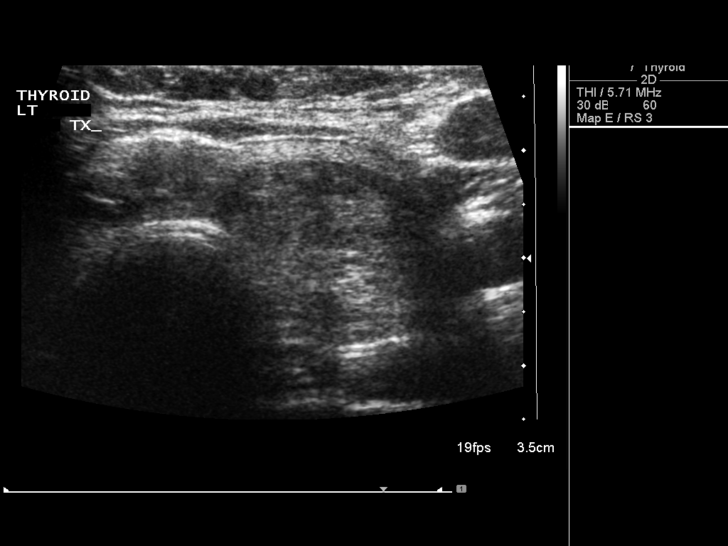
[im 15/15]
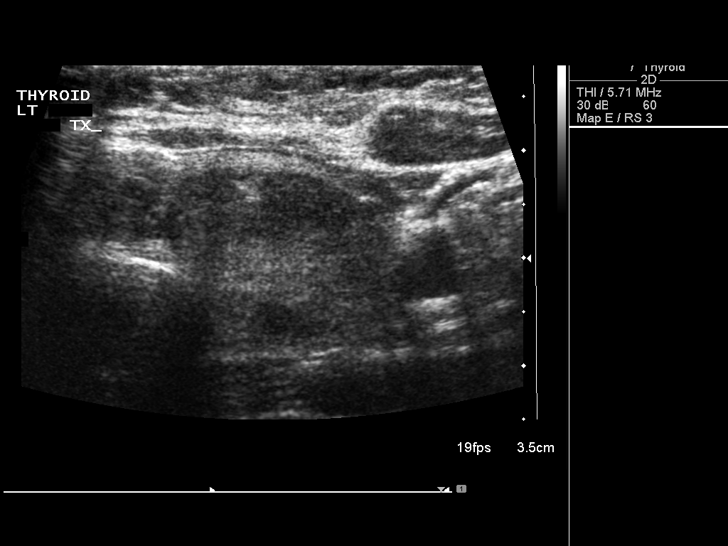

[13 of 15 positions shown; findings below may reference images not displayed]

Pre-procedural ultrasound scanning demonstrated L thyroid nodule

The procedure was planned. The neck was prepped in the usual sterile
fashion, and a sterile drape was applied covering the operative
field. A timeout was performed prior to the initiation of the
procedure. Local anesthesia was provided with 1% lidocaine.

Under direct ultrasound guidance, 4 FNA biopsies were performed of
the Lt thyroid nodule with a 25 gauge needle. The samples were
prepared and submitted to pathology.

Limited post procedural scanning was negative for hematoma or
additional complication. Dressings were placed. The patient
tolerated the above procedures procedure well without immediate
postprocedural complication.
IMPRESSION: Technically successful ultrasound guided fine needle aspiration of L
thyroid nodule

Read by:  HALDER

## 2015-08-24 ENCOUNTER — Encounter: Payer: Self-pay | Admitting: Women's Health

## 2015-08-24 ENCOUNTER — Ambulatory Visit (INDEPENDENT_AMBULATORY_CARE_PROVIDER_SITE_OTHER): Payer: BLUE CROSS/BLUE SHIELD | Admitting: Women's Health

## 2015-08-24 VITALS — BP 118/80 | Ht 65.0 in | Wt 139.0 lb

## 2015-08-24 DIAGNOSIS — Z79890 Hormone replacement therapy (postmenopausal): Secondary | ICD-10-CM | POA: Diagnosis not present

## 2015-08-24 DIAGNOSIS — Z01419 Encounter for gynecological examination (general) (routine) without abnormal findings: Secondary | ICD-10-CM | POA: Diagnosis not present

## 2015-08-24 DIAGNOSIS — E038 Other specified hypothyroidism: Secondary | ICD-10-CM

## 2015-08-24 MED ORDER — ESTRADIOL 0.0375 MG/24HR TD PTTW: 1.0000 | MEDICATED_PATCH | TRANSDERMAL | Status: DC

## 2015-08-24 NOTE — Progress Notes (Signed)
Sharon Kirk [birth year] [id number]    History:    Presents for annual exam.  TVH for menorrhagia has tried to wean off Climara patch last year but has had increased hot flushes, poor sleep and requests refill. Normal Pap and mammogram history. 2010 negative colonoscopy. 2013 normal DEXA. 07/2015 benign thyroid nodule on Synthroid.  Past medical history, past surgical history, family history and social history were all reviewed and documented in the EPIC chart. Customer service at [employer]  ROS:  A ROS was performed and pertinent positives and negatives are included.  Exam:  Filed Vitals:   08/24/15 0809  BP: 118/80    General appearance:  Normal Thyroid:  Symmetrical, normal in size, without palpable masses or nodularity. Respiratory  Auscultation:  Clear without wheezing or rhonchi Cardiovascular  Auscultation:  Regular rate, without rubs, murmurs or gallops  Edema/varicosities:  Not grossly evident Abdominal  Soft,nontender, without masses, guarding or rebound.  Liver/spleen:  No organomegaly noted  Hernia:  None appreciated  Skin  Inspection:  Grossly normal   Breasts: Examined lying and sitting.     Right: Without masses, retractions, discharge or axillary adenopathy.     Left: Without masses, retractions, discharge or axillary adenopathy. Gentitourinary   Inguinal/mons:  Normal without inguinal adenopathy  External genitalia:  Normal  BUS/Urethra/Skene's glands:  Normal  Vagina:  Normal  Cervix:  And uterus absent  Adnexa/parametria:     Rt: Without masses or tenderness.   Lt: Without masses or tenderness.  Anus and perineum: Normal  Digital rectal exam: Normal sphincter tone without palpated masses or tenderness  Assessment/Plan:  54 y.o. [name] G0 for annual exam with no complaints.  TVH for menorrhagia on HRT Hypothyroid on Synthroid-primary care manages labs and meds Many moles-annual skin check  Plan: Vivelle dot 0.0375, patch twice weekly, will call if  problems with change. Reviewed risks of blood clots, strokes and breast cancer. SBE's, continue annual screening mammogram, 3-D tomography reviewed and encouraged history of dense breasts. Exercise, calcium rich diet, vitamin D 1000 daily encouraged will have vitamin D level checked at primary care with lab draw today. Home safety, fall prevention and importance of weightbearing exercise reviewed. U[facility]uel [facility], 8:39 AM 08/24/2015

## 2015-08-24 NOTE — Patient Instructions (Signed)

## 2015-08-25 LAB — URINALYSIS W MICROSCOPIC + REFLEX CULTURE
Bacteria, UA: NONE SEEN [HPF]
Bilirubin Urine: NEGATIVE
Casts: NONE SEEN [LPF]
Crystals: NONE SEEN [HPF]
Glucose, UA: NEGATIVE
Hgb urine dipstick: NEGATIVE
Ketones, ur: NEGATIVE
Leukocytes, UA: NEGATIVE
Nitrite: NEGATIVE
Protein, ur: NEGATIVE
RBC / HPF: NONE SEEN RBC/HPF (ref <=2)
Specific Gravity, Urine: 1.003 (ref 1.001–1.035)
Squamous Epithelial / LPF: NONE SEEN [HPF] (ref <=5)
WBC, UA: NONE SEEN WBC/HPF (ref <=5)
Yeast: NONE SEEN [HPF]
pH: 7 (ref 5.0–8.0)

## 2016-03-22 ENCOUNTER — Other Ambulatory Visit: Payer: Self-pay | Admitting: Women's Health

## 2016-03-22 DIAGNOSIS — Z1231 Encounter for screening mammogram for malignant neoplasm of breast: Secondary | ICD-10-CM

## 2016-04-04 DIAGNOSIS — E042 Nontoxic multinodular goiter: Secondary | ICD-10-CM | POA: Diagnosis not present

## 2016-04-04 DIAGNOSIS — E039 Hypothyroidism, unspecified: Secondary | ICD-10-CM | POA: Diagnosis not present

## 2016-04-08 IMAGING — US US THYROID
1 series · 13 of 25 positions shown · non-contrast
Comparison: [DATE], [DATE]

CLINICAL DATA: Thyroid nodules, previous left thyroid biopsy
[DATE]

EXAM:
THYROID ULTRASOUND
TECHNIQUE: Ultrasound examination of the thyroid gland and adjacent soft
tissues was performed.

[Series 1: us thyroid · 0.08mm/px · 13 of 60 slices shown]
[im 1/60]
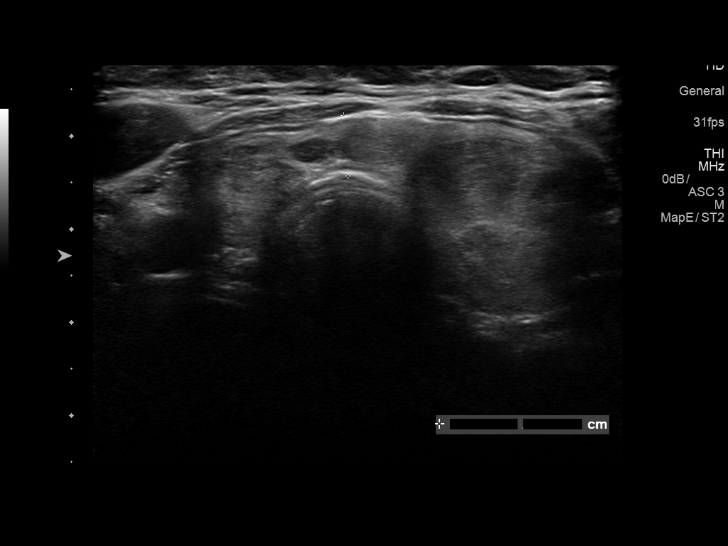
[im 5/60]
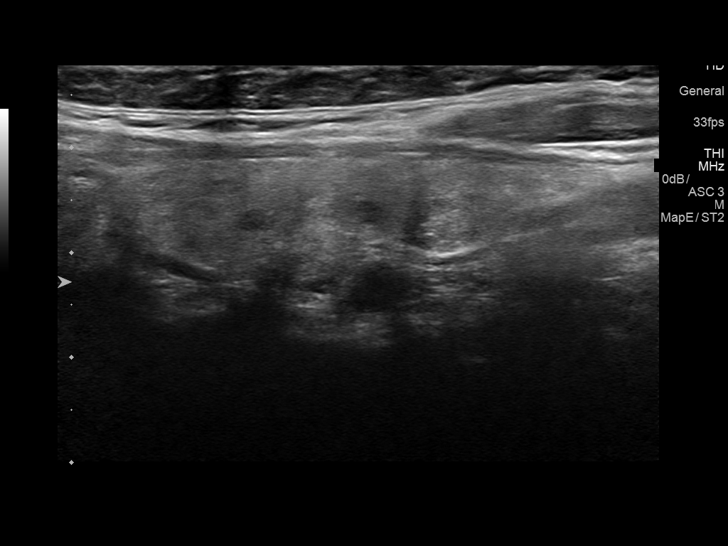
[im 10/60]
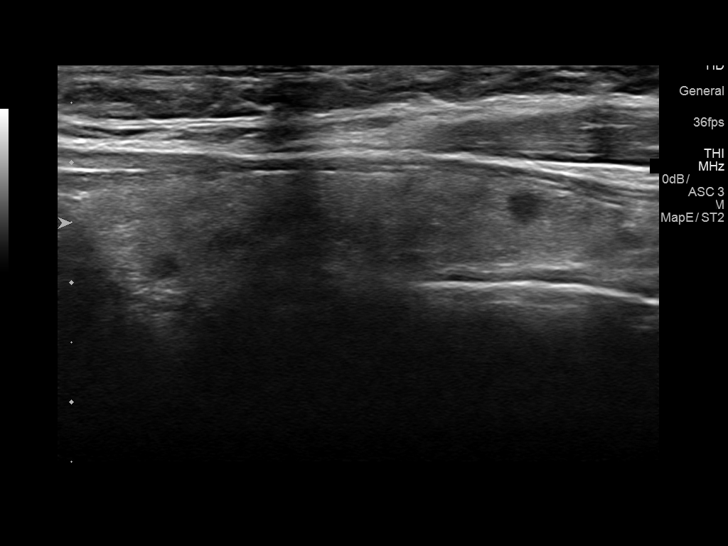
[im 15/60]
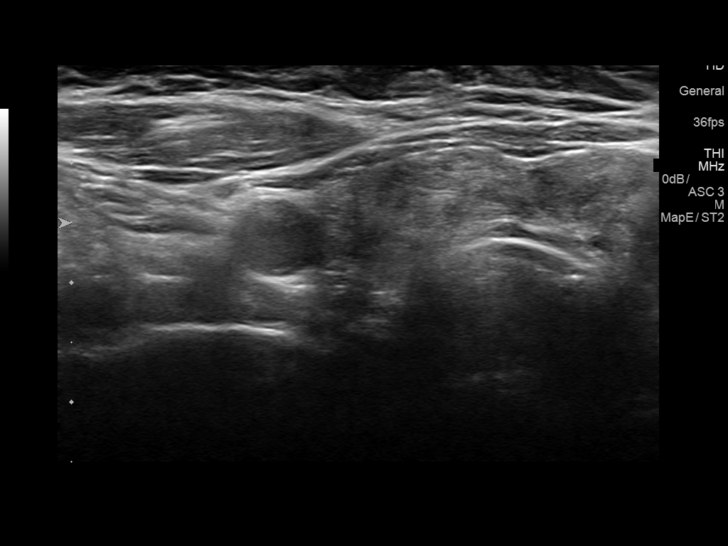
[im 20/60]
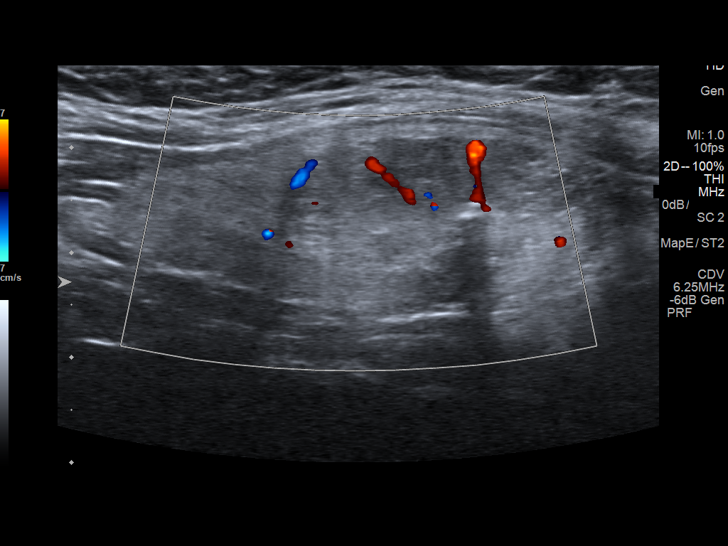
[im 25/60]
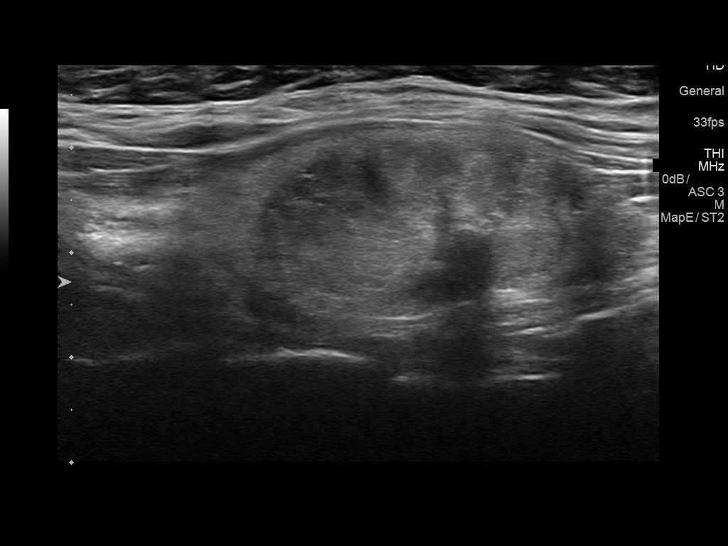
[im 30/60]
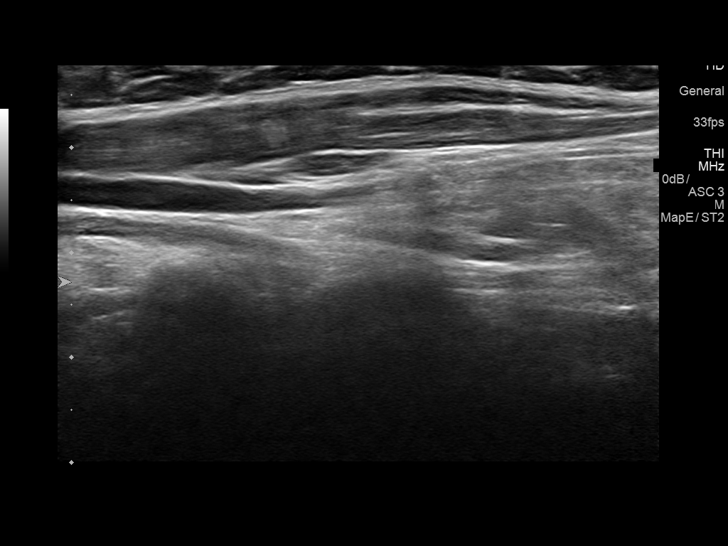
[im 35/60]
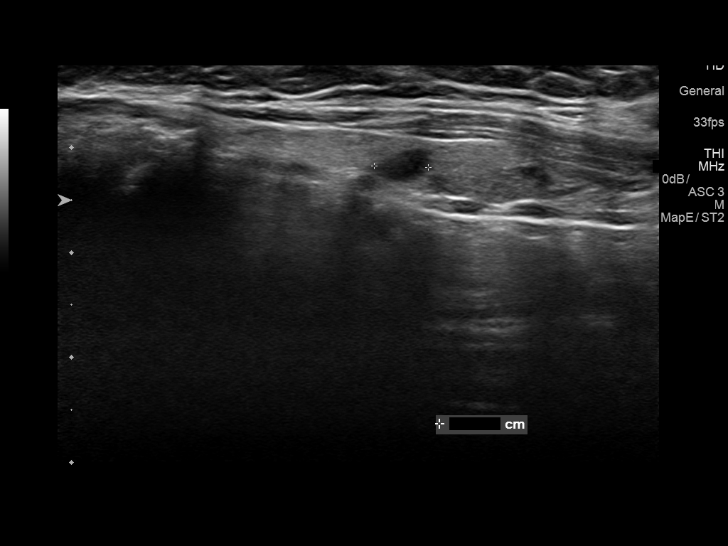
[im 40/60]
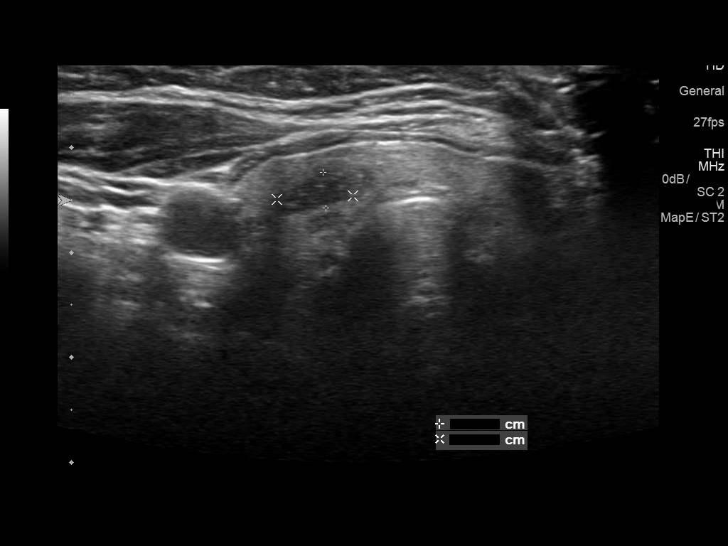
[im 45/60]
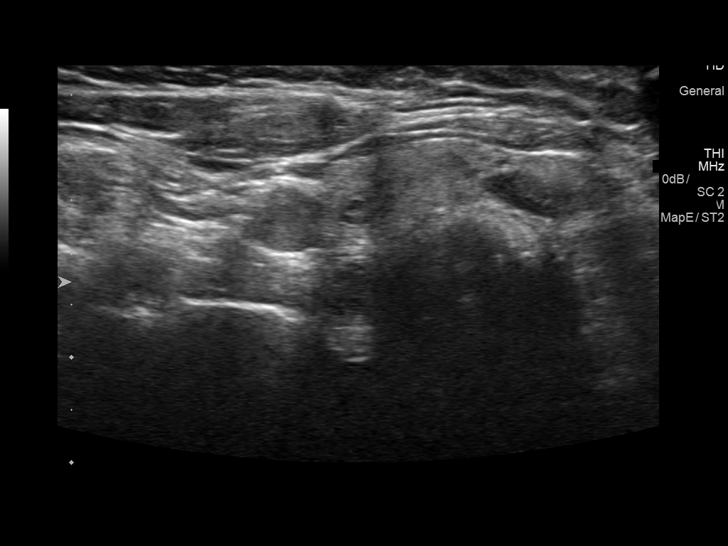
[im 50/60]
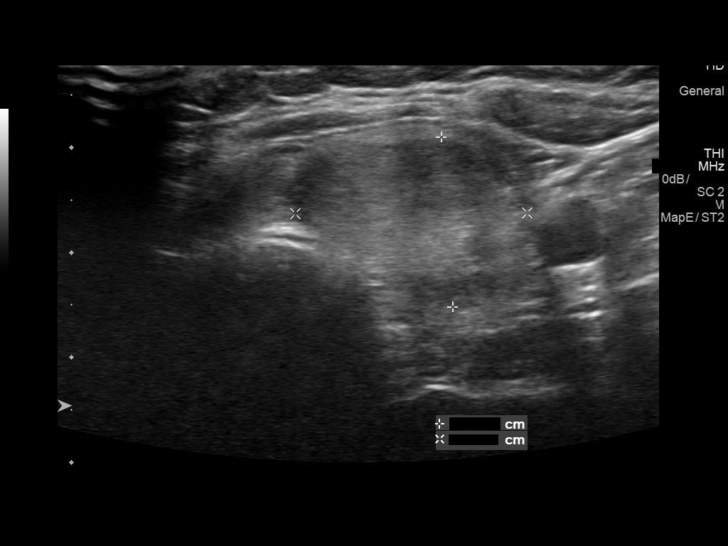
[im 55/60]
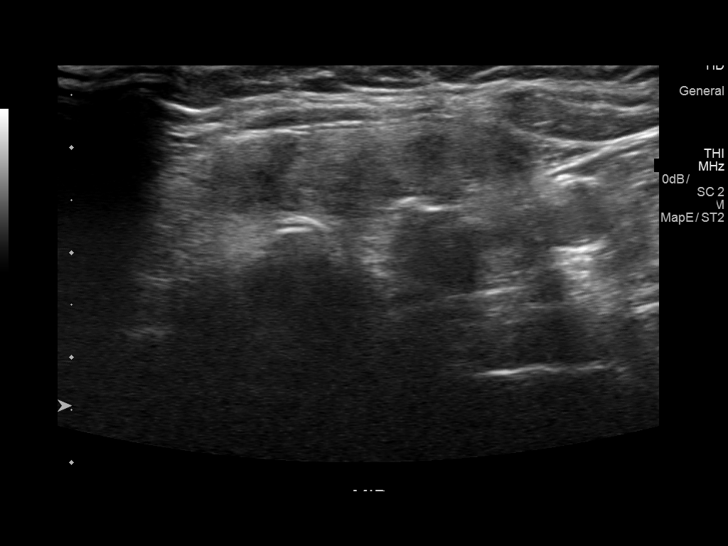
[im 60/60]
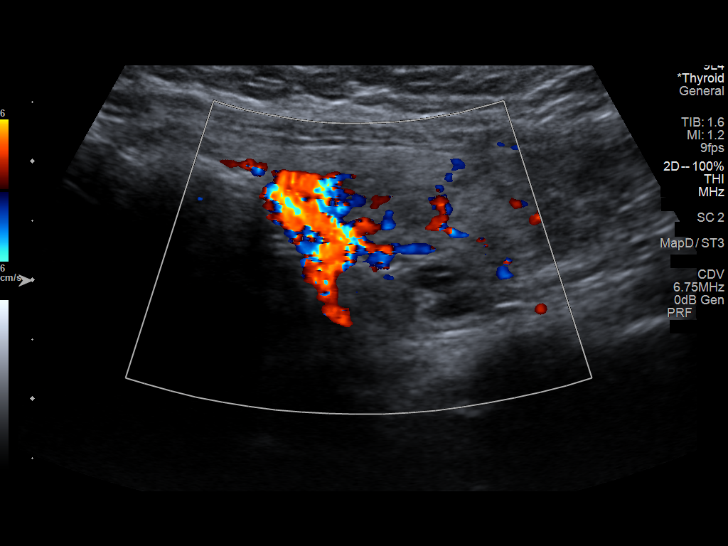

[13 of 25 positions shown; findings below may reference images not displayed]

FINDINGS: Parenchymal Echotexture: Moderately heterogenous

Isthmus: 7 mm, previously 9 mm

Right lobe: 4.7 x 1.2 x 1.2 cm, previously 4.9 x 1.8 x 1.8 cm

Left lobe: 5.1 x 2.0 x 2.2 cm, previously 5.4 x 1.9 x 1.9 cm

_________________________________________________________

Estimated total number of nodules >/= 1 cm: 1

Number of spongiform nodules >/=  2 cm not described below (TR1): 0

Number of mixed cystic and solid nodules >/= 1.5 cm not described
below (TR2): 0

_________________________________________________________

Nodule # 4:

Prior biopsy: Yes

Location: Left; Inferior

Maximum size: 3.3, previously 3.2 cm; Other 2 dimensions: 1.6 x
cm, previously, 1.8 x 2.0 cm

Composition: solid/almost completely solid (2)

Echogenicity: isoechoic (1)

Shape: not taller-than-wide (0)

Margins: ill-defined (0)

Echogenic foci: none (0)

ACR TI-RADS total points: 3.

ACR TI-RADS risk category:  TR3 (3 points).

Significant change in size (>/= 20% in two dimensions and minimal
increase of 2 mm): No

Change in features: No

Change in ACR TI-RADS risk category: No

ACR TI-RADS recommendations:

**Given size (>/= 2.5 cm) and appearance, fine needle aspiration of
this mildly suspicious nodule should be considered based on TI-RADS
criteria. Please note nodule has been previously biopsied. Correlate
with prior pathology.

_________________________________________________________

There are additional bilateral subcentimeter cystic and hypoechoic
nodules noted all measuring 8 mm or less in size. These would not
meet criteria for biopsy or any follow-up. No significant interval
change.

No adenopathy
IMPRESSION: 3.3 cm left inferior TR 3 nodule meets criteria for biopsy which has
already been performed [DATE]. Correlate with prior pathology.

Otherwise stable thyroid ultrasound

The above is in keeping with the ACR TI-RADS recommendations - [HOSPITAL] [JG];[DATE].

## 2016-04-26 DIAGNOSIS — D225 Melanocytic nevi of trunk: Secondary | ICD-10-CM | POA: Diagnosis not present

## 2016-04-26 DIAGNOSIS — D2261 Melanocytic nevi of right upper limb, including shoulder: Secondary | ICD-10-CM | POA: Diagnosis not present

## 2016-04-26 DIAGNOSIS — D485 Neoplasm of uncertain behavior of skin: Secondary | ICD-10-CM | POA: Diagnosis not present

## 2016-04-26 DIAGNOSIS — C4372 Malignant melanoma of left lower limb, including hip: Secondary | ICD-10-CM | POA: Diagnosis not present

## 2016-04-26 DIAGNOSIS — D224 Melanocytic nevi of scalp and neck: Secondary | ICD-10-CM | POA: Diagnosis not present

## 2016-04-26 DIAGNOSIS — D2262 Melanocytic nevi of left upper limb, including shoulder: Secondary | ICD-10-CM | POA: Diagnosis not present

## 2016-04-26 DIAGNOSIS — D2272 Melanocytic nevi of left lower limb, including hip: Secondary | ICD-10-CM | POA: Diagnosis not present

## 2016-04-26 DIAGNOSIS — C4361 Malignant melanoma of right upper limb, including shoulder: Secondary | ICD-10-CM | POA: Diagnosis not present

## 2016-05-02 DIAGNOSIS — E039 Hypothyroidism, unspecified: Secondary | ICD-10-CM | POA: Diagnosis not present

## 2016-05-02 DIAGNOSIS — E042 Nontoxic multinodular goiter: Secondary | ICD-10-CM | POA: Diagnosis not present

## 2016-05-09 ENCOUNTER — Ambulatory Visit: Payer: Self-pay | Admitting: General Surgery

## 2016-05-09 DIAGNOSIS — C4361 Malignant melanoma of right upper limb, including shoulder: Secondary | ICD-10-CM | POA: Diagnosis not present

## 2016-05-09 DIAGNOSIS — C4372 Malignant melanoma of left lower limb, including hip: Secondary | ICD-10-CM | POA: Diagnosis not present

## 2016-05-09 NOTE — H&P (Signed)
Sharon Kirk 05/09/2016 11:23 AM Location: [facility] Patient #: [id number] DOB: 1960/10/30 Married / Language: English / Race: White Female  History of Present Illness Sharon Hollingshead MD; 05/09/2016 12:12 PM) Patient words: Melanoma, left lower leg.  The patient is a 55 year old female.   Note:She is referred by Dr. Pablo Ledger for consultation regarding an immediate thickness melanoma of the left lower leg and a thin melanoma of the right shoulder. She went to be evaluated because of some atypical moles and multiple shave biopsies were done. She had 2 melanomas as above. The left lower leg melanoma is 1.25 mm thick with peripheral margins involved and deep margin free of tumor. There is some ulceration present. No satellitosis. The right shoulder melanoma is 0.3 mm thick with pressure margins involved but deep margin free of tumor. No ulceration or satellitosis. She denies getting any sun burns to these areas. She denies a family history of melanoma.  Allergies (April Staton, CMA; 05/09/2016 11:28 AM) OxyCODONE HCl *ANALGESICS - OPIOID* HYDROcodone Bitartrate *CHEMICALS* Codeine Phosphate *ANALGESICS - OPIOID* Nausea.  Medication History (April Staton, CMA; 05/09/2016 11:29 AM) Synthroid (75MCG Tablet, Oral) Active. Estradiol (0.0375MG /24HR Patch TW, Transdermal) Active. Calcium Carbonate (600MG  Tablet, Oral daily) Active. Vitamin D (1000UNIT Tablet, Oral daily) Active. Levothyroxine Sodium (50MCG Tablet, Oral daily) Active. Magnesium (200MG  Tablet, Oral daily) Active. (Pt unsure of mg strength) Fish Oil (1200MG  Capsule, Oral daily) Active. Claritin (5MG /5ML Syrup, Oral as directed) Active. Medications Reconciled     Review of Systems Sharon Hollingshead MD; 05/09/2016 12:17 PM)  Note: Denies any sun burns to the areas.  No difficulty with enlarged thyroid since I saw her last.   Vitals (April Staton CMA; 05/09/2016 11:29 AM) 05/09/2016  11:29 AM Weight: 160.25 lb Height: 65in Height was reported by patient. Body Surface Area: 1.8 m Body Mass Index: 26.67 kg/m  Temp.: 97.1F(Oral)  Pulse: 67 (Regular)  P.OX: 98% (Room air) BP: 122/72 (Sitting, Left Arm, Standard)      Physical Exam Sharon Hollingshead MD; 05/09/2016 12:14 PM)  The physical exam findings are as follows: Note:General: WDWN in NAD. Pleasant and cooperative.  HEENT: [place]/AT, no external nasal or ear masses, mucous membranes are moist  CV: RRR, no murmur, no edema  ABDOMEN: Soft, nontender, nondistended, no masses, no organomegaly  MUSCULOSKELETAL: FROM, good muscle tone, no edema, no venous stasis changes, normal station and gait  LYMPHATIC: No palpable inguinal adenopathy.  SKIN: 1.2 cm open wound left lateral lower leg that is clean. 1 cm healing wound right shoulder adjacent to a mole.  NEUROLOGIC: Alert and oriented, answers questions appropriately, normal gait and station.  PSYCHIATRIC: Normal mood, affect , and behavior.    Assessment & Plan Sharon Hollingshead MD; 05/09/2016 12:12 PM)  MELANOMA OF SHOULDER, RIGHT (C43.61) Impression: This is thin (0.3 mm). Margins involved.  Plan: Wide excision with 1 cm margins.  MELANOMA OF LOWER LEG, LEFT (C43.72) Impression: This is intermediate thickness (1.25 mm). Peripheral margins positive.  Plan: Wide local excision with 1-2 cm margins. Left inguinal sentinel lymph node biopsy. I discussed the procedures and risks with her. Risks include but not limited to bleeding, infection, wound healing problems, need for partial open wound and skin graft in the future, excessive bleeding, nerve injury and weakness of left lower leg, left inguinal seroma, left lower extremity edema. We did discuss that if the mapping was negative for the left leg melanoma, I would not proceed with any lymph node biopsy. She  seems to understand all this and would like to proceed.  Jackolyn Confer, MD

## 2016-05-10 ENCOUNTER — Ambulatory Visit: Payer: Self-pay | Admitting: General Surgery

## 2016-05-10 DIAGNOSIS — C4372 Malignant melanoma of left lower limb, including hip: Secondary | ICD-10-CM

## 2016-05-22 ENCOUNTER — Encounter (HOSPITAL_BASED_OUTPATIENT_CLINIC_OR_DEPARTMENT_OTHER)
Admission: RE | Admit: 2016-05-22 | Discharge: 2016-05-22 | Disposition: A | Discharge status: Home / Self Care | Payer: BLUE CROSS/BLUE SHIELD | Source: Ambulatory Visit | Attending: General Surgery | Admitting: General Surgery

## 2016-05-22 ENCOUNTER — Encounter (HOSPITAL_BASED_OUTPATIENT_CLINIC_OR_DEPARTMENT_OTHER): Payer: Self-pay | Admitting: *Deleted

## 2016-05-22 DIAGNOSIS — E039 Hypothyroidism, unspecified: Secondary | ICD-10-CM | POA: Diagnosis not present

## 2016-05-22 DIAGNOSIS — C4361 Malignant melanoma of right upper limb, including shoulder: Secondary | ICD-10-CM | POA: Diagnosis not present

## 2016-05-22 DIAGNOSIS — C4372 Malignant melanoma of left lower limb, including hip: Secondary | ICD-10-CM | POA: Diagnosis not present

## 2016-05-22 LAB — COMPREHENSIVE METABOLIC PANEL
ALT: 17 U/L (ref 14–54)
AST: 18 U/L (ref 15–41)
Albumin: 4.2 g/dL (ref 3.5–5.0)
Alkaline Phosphatase: 58 U/L (ref 38–126)
Anion gap: 7 (ref 5–15)
BUN: 9 mg/dL (ref 6–20)
CO2: 30 mmol/L (ref 22–32)
Calcium: 9.7 mg/dL (ref 8.9–10.3)
Chloride: 104 mmol/L (ref 101–111)
Creatinine, Ser: 0.51 mg/dL (ref 0.44–1.00)
GFR calc Af Amer: >60 mL/min (ref >60)
GFR calc non Af Amer: >60 mL/min (ref >60)
Glucose, Bld: 96 mg/dL (ref 65–99)
Potassium: 4.4 mmol/L (ref 3.5–5.1)
Sodium: 141 mmol/L (ref 135–145)
Total Bilirubin: 0.4 mg/dL (ref 0.3–1.2)
Total Protein: 6.6 g/dL (ref 6.5–8.1)

## 2016-05-22 LAB — CBC WITH DIFFERENTIAL/PLATELET
Basophils Absolute: 0 10*3/uL (ref 0.0–0.1)
Basophils Relative: 0 %
Eosinophils Absolute: 0.1 10*3/uL (ref 0.0–0.7)
Eosinophils Relative: 1 %
HCT: 43.9 % (ref 36.0–46.0)
Hemoglobin: 14.6 g/dL (ref 12.0–15.0)
Lymphocytes Relative: 39 %
Lymphs Abs: 1.9 10*3/uL (ref 0.7–4.0)
MCH: 28.6 pg (ref 26.0–34.0)
MCHC: 33.3 g/dL (ref 30.0–36.0)
MCV: 85.9 fL (ref 78.0–100.0)
Monocytes Absolute: 0.3 10*3/uL (ref 0.1–1.0)
Monocytes Relative: 7 %
Neutro Abs: 2.6 10*3/uL (ref 1.7–7.7)
Neutrophils Relative %: 53 %
Platelets: 229 10*3/uL (ref 150–400)
RBC: 5.11 MIL/uL (ref 3.87–5.11)
RDW: 13.3 % (ref 11.5–15.5)
WBC: 4.8 10*3/uL (ref 4.0–10.5)

## 2016-05-23 ENCOUNTER — Ambulatory Visit (HOSPITAL_BASED_OUTPATIENT_CLINIC_OR_DEPARTMENT_OTHER)
Admission: RE | Admit: 2016-05-23 | Discharge: 2016-05-23 | Disposition: A | Discharge status: Home / Self Care | Payer: BLUE CROSS/BLUE SHIELD | Source: Ambulatory Visit | Attending: General Surgery | Admitting: General Surgery

## 2016-05-23 ENCOUNTER — Encounter (HOSPITAL_BASED_OUTPATIENT_CLINIC_OR_DEPARTMENT_OTHER)
Admission: RE | Disposition: A | Discharge status: Home / Self Care | Payer: Self-pay | Source: Ambulatory Visit | Attending: General Surgery

## 2016-05-23 ENCOUNTER — Encounter (HOSPITAL_COMMUNITY)
Admission: RE | Admit: 2016-05-23 | Discharge: 2016-05-23 | Disposition: A | Discharge status: Home / Self Care | Payer: BLUE CROSS/BLUE SHIELD | Source: Ambulatory Visit | Attending: General Surgery | Admitting: General Surgery

## 2016-05-23 ENCOUNTER — Ambulatory Visit (HOSPITAL_BASED_OUTPATIENT_CLINIC_OR_DEPARTMENT_OTHER): Payer: BLUE CROSS/BLUE SHIELD | Admitting: Anesthesiology

## 2016-05-23 ENCOUNTER — Encounter (HOSPITAL_BASED_OUTPATIENT_CLINIC_OR_DEPARTMENT_OTHER): Payer: Self-pay | Admitting: *Deleted

## 2016-05-23 DIAGNOSIS — C4361 Malignant melanoma of right upper limb, including shoulder: Secondary | ICD-10-CM | POA: Diagnosis not present

## 2016-05-23 DIAGNOSIS — C439 Malignant melanoma of skin, unspecified: Secondary | ICD-10-CM | POA: Diagnosis not present

## 2016-05-23 DIAGNOSIS — C774 Secondary and unspecified malignant neoplasm of inguinal and lower limb lymph nodes: Secondary | ICD-10-CM | POA: Diagnosis not present

## 2016-05-23 DIAGNOSIS — E039 Hypothyroidism, unspecified: Secondary | ICD-10-CM | POA: Insufficient documentation

## 2016-05-23 DIAGNOSIS — L989 Disorder of the skin and subcutaneous tissue, unspecified: Secondary | ICD-10-CM | POA: Diagnosis not present

## 2016-05-23 DIAGNOSIS — C4372 Malignant melanoma of left lower limb, including hip: Secondary | ICD-10-CM | POA: Insufficient documentation

## 2016-05-23 HISTORY — DX: Nontoxic single thyroid nodule: E04.1

## 2016-05-23 HISTORY — PX: MELANOMA EXCISION: SHX5266

## 2016-05-23 HISTORY — DX: Nausea with vomiting, unspecified: R11.2

## 2016-05-23 HISTORY — PX: SENTINEL NODE BIOPSY: SHX6608

## 2016-05-23 HISTORY — DX: Other specified postprocedural states: Z98.890

## 2016-05-23 SURGERY — EXCISION, MELANOMA
Anesthesia: General | Site: Shoulder | Laterality: Right

## 2016-05-23 MED ORDER — PROPOFOL 10 MG/ML IV BOLUS
INTRAVENOUS | Status: AC
  Filled 2016-05-23: qty 20

## 2016-05-23 MED ORDER — FENTANYL CITRATE (PF) 100 MCG/2ML IJ SOLN
INTRAMUSCULAR | Status: AC
  Filled 2016-05-23: qty 2

## 2016-05-23 MED ORDER — BACITRACIN-NEOMYCIN-POLYMYXIN OINTMENT TUBE
TOPICAL_OINTMENT | CUTANEOUS | Status: DC | PRN
  Administered 2016-05-23 (×2): 1 via TOPICAL

## 2016-05-23 MED ORDER — BUPIVACAINE HCL (PF) 0.5 % IJ SOLN
INTRAMUSCULAR | Status: AC
  Filled 2016-05-23: qty 30

## 2016-05-23 MED ORDER — PROMETHAZINE HCL 25 MG/ML IJ SOLN: 6.2500 mg | INTRAMUSCULAR | Status: DC | PRN

## 2016-05-23 MED ORDER — ONDANSETRON HCL 4 MG/2ML IJ SOLN
INTRAMUSCULAR | Status: AC
  Filled 2016-05-23: qty 2

## 2016-05-23 MED ORDER — DEXAMETHASONE SODIUM PHOSPHATE 4 MG/ML IJ SOLN
INTRAMUSCULAR | Status: DC | PRN
  Administered 2016-05-23: 10 mg via INTRAVENOUS

## 2016-05-23 MED ORDER — ONDANSETRON HCL 4 MG PO TABS: 4.0000 mg | ORAL_TABLET | Freq: Three times a day (TID) | ORAL | 0 refills | Status: DC | PRN

## 2016-05-23 MED ORDER — SCOPOLAMINE 1 MG/3DAYS TD PT72
MEDICATED_PATCH | TRANSDERMAL | Status: AC
  Filled 2016-05-23: qty 1

## 2016-05-23 MED ORDER — LIDOCAINE 2% (20 MG/ML) 5 ML SYRINGE
INTRAMUSCULAR | Status: DC | PRN
Start: 2016-05-23 — End: 2016-05-23
  Administered 2016-05-23: 100 mg via INTRAVENOUS

## 2016-05-23 MED ORDER — CHLORHEXIDINE GLUCONATE CLOTH 2 % EX PADS: 6.0000 | MEDICATED_PAD | Freq: Once | CUTANEOUS | Status: DC

## 2016-05-23 MED ORDER — BUPIVACAINE HCL (PF) 0.5 % IJ SOLN
INTRAMUSCULAR | Status: DC | PRN
  Administered 2016-05-23: 14.5 mL
  Administered 2016-05-23: 4.5 mL
  Administered 2016-05-23: 6 mL

## 2016-05-23 MED ORDER — EPHEDRINE 5 MG/ML INJ
INTRAVENOUS | Status: AC
  Filled 2016-05-23: qty 10

## 2016-05-23 MED ORDER — HYDROCODONE-ACETAMINOPHEN 5-325 MG PO TABS: 1.0000 | ORAL_TABLET | ORAL | 0 refills | Status: DC | PRN

## 2016-05-23 MED ORDER — CEFAZOLIN SODIUM-DEXTROSE 2-4 GM/100ML-% IV SOLN
INTRAVENOUS | Status: AC
  Filled 2016-05-23: qty 100

## 2016-05-23 MED ORDER — PHENYLEPHRINE HCL 10 MG/ML IJ SOLN
INTRAMUSCULAR | Status: DC | PRN
  Administered 2016-05-23 (×2): 40 ug via INTRAVENOUS

## 2016-05-23 MED ORDER — PROPOFOL 10 MG/ML IV BOLUS
INTRAVENOUS | Status: DC | PRN
  Administered 2016-05-23: 200 mg via INTRAVENOUS

## 2016-05-23 MED ORDER — BACITRACIN-NEOMYCIN-POLYMYXIN 400-5-5000 EX OINT
TOPICAL_OINTMENT | CUTANEOUS | Status: AC
  Filled 2016-05-23: qty 1

## 2016-05-23 MED ORDER — SCOPOLAMINE 1 MG/3DAYS TD PT72
1.0000 | MEDICATED_PATCH | Freq: Once | TRANSDERMAL | Status: DC | PRN
  Administered 2016-05-23: 1.5 mg via TRANSDERMAL

## 2016-05-23 MED ORDER — TECHNETIUM TC 99M SULFUR COLLOID FILTERED
0.5000 | Freq: Once | INTRAVENOUS | Status: AC | PRN
  Administered 2016-05-23: 0.5 via INTRADERMAL

## 2016-05-23 MED ORDER — ONDANSETRON HCL 4 MG/2ML IJ SOLN
INTRAMUSCULAR | Status: DC | PRN
  Administered 2016-05-23: 4 mg via INTRAVENOUS

## 2016-05-23 MED ORDER — FENTANYL CITRATE (PF) 100 MCG/2ML IJ SOLN
50.0000 ug | INTRAMUSCULAR | Status: AC | PRN
  Administered 2016-05-23: 12.5 ug via INTRAVENOUS
  Administered 2016-05-23: 50 ug via INTRAVENOUS
  Administered 2016-05-23: 25 ug via INTRAVENOUS

## 2016-05-23 MED ORDER — MIDAZOLAM HCL 2 MG/2ML IJ SOLN
1.0000 mg | INTRAMUSCULAR | Status: DC | PRN
  Administered 2016-05-23: 1 mg via INTRAVENOUS
  Administered 2016-05-23: 2 mg via INTRAVENOUS

## 2016-05-23 MED ORDER — MIDAZOLAM HCL 2 MG/2ML IJ SOLN
INTRAMUSCULAR | Status: AC
  Filled 2016-05-23: qty 2

## 2016-05-23 MED ORDER — LIDOCAINE 2% (20 MG/ML) 5 ML SYRINGE
INTRAMUSCULAR | Status: AC
  Filled 2016-05-23: qty 5

## 2016-05-23 MED ORDER — SODIUM CHLORIDE 0.9 % IJ SOLN
INTRAMUSCULAR | Status: AC
  Filled 2016-05-23: qty 10

## 2016-05-23 MED ORDER — DEXAMETHASONE SODIUM PHOSPHATE 10 MG/ML IJ SOLN
INTRAMUSCULAR | Status: AC
  Filled 2016-05-23: qty 1

## 2016-05-23 MED ORDER — FENTANYL CITRATE (PF) 100 MCG/2ML IJ SOLN
25.0000 ug | INTRAMUSCULAR | Status: DC | PRN
  Administered 2016-05-23: 25 ug via INTRAVENOUS

## 2016-05-23 MED ORDER — GLYCOPYRROLATE 0.2 MG/ML IJ SOLN: 0.2000 mg | Freq: Once | INTRAMUSCULAR | Status: DC | PRN

## 2016-05-23 MED ORDER — METHYLENE BLUE 0.5 % INJ SOLN
INTRAVENOUS | Status: AC
  Filled 2016-05-23: qty 10

## 2016-05-23 MED ORDER — PHENYLEPHRINE 40 MCG/ML (10ML) SYRINGE FOR IV PUSH (FOR BLOOD PRESSURE SUPPORT)
PREFILLED_SYRINGE | INTRAVENOUS | Status: AC
  Filled 2016-05-23: qty 10

## 2016-05-23 MED ORDER — LACTATED RINGERS IV SOLN
INTRAVENOUS | Status: DC
  Administered 2016-05-23 (×2): via INTRAVENOUS

## 2016-05-23 MED ORDER — EPHEDRINE SULFATE 50 MG/ML IJ SOLN
INTRAMUSCULAR | Status: DC | PRN
  Administered 2016-05-23 (×5): 10 mg via INTRAVENOUS

## 2016-05-23 MED ORDER — CEFAZOLIN SODIUM-DEXTROSE 2-4 GM/100ML-% IV SOLN
2.0000 g | INTRAVENOUS | Status: AC
  Administered 2016-05-23: 2 g via INTRAVENOUS

## 2016-05-23 SURGICAL SUPPLY — 62 items
APPLIER CLIP 9.375 MED OPEN (MISCELLANEOUS)
BENZOIN TINCTURE PRP APPL 2/3 (GAUZE/BANDAGES/DRESSINGS) ×3 IMPLANT
BLADE CLIPPER SURG (BLADE) ×6 IMPLANT
BLADE SURG 15 STRL LF DISP TIS (BLADE) ×6 IMPLANT
BLADE SURG 15 STRL SS (BLADE) ×3
BNDG GAUZE ELAST 4 BULKY (GAUZE/BANDAGES/DRESSINGS) ×3 IMPLANT
CANISTER SUCT 1200ML W/VALVE (MISCELLANEOUS) ×3 IMPLANT
CHLORAPREP W/TINT 26ML (MISCELLANEOUS) ×6 IMPLANT
CLIP APPLIE 9.375 MED OPEN (MISCELLANEOUS) IMPLANT
CLIP TI MEDIUM 6 (CLIP) IMPLANT
COVER BACK TABLE 60X90IN (DRAPES) ×3 IMPLANT
COVER MAYO STAND STRL (DRAPES) ×6 IMPLANT
COVER PROBE W GEL 5X96 (DRAPES) ×3 IMPLANT
DECANTER SPIKE VIAL GLASS SM (MISCELLANEOUS) IMPLANT
DRAPE LAPAROTOMY 100X72 PEDS (DRAPES) IMPLANT
DRAPE LAPAROTOMY TRNSV 102X78 (DRAPE) ×3 IMPLANT
DRAPE UTILITY XL STRL (DRAPES) ×9 IMPLANT
DRSG ADAPTIC 3X8 NADH LF (GAUZE/BANDAGES/DRESSINGS) ×3 IMPLANT
DRSG TEGADERM 4X4.75 (GAUZE/BANDAGES/DRESSINGS) ×3 IMPLANT
ELECT COATED BLADE 2.86 ST (ELECTRODE) ×6 IMPLANT
ELECT REM PT RETURN 9FT ADLT (ELECTROSURGICAL) ×3
ELECTRODE REM PT RTRN 9FT ADLT (ELECTROSURGICAL) ×2 IMPLANT
GAUZE SPONGE 4X4 12PLY STRL (GAUZE/BANDAGES/DRESSINGS) ×3 IMPLANT
GLOVE BIO SURGEON STRL SZ8.5 (GLOVE) ×3 IMPLANT
GLOVE BIOGEL PI IND STRL 7.0 (GLOVE) ×2 IMPLANT
GLOVE BIOGEL PI IND STRL 8.5 (GLOVE) ×4 IMPLANT
GLOVE BIOGEL PI INDICATOR 7.0 (GLOVE) ×1
GLOVE BIOGEL PI INDICATOR 8.5 (GLOVE) ×2
GLOVE ECLIPSE 8.0 STRL XLNG CF (GLOVE) ×9 IMPLANT
GLOVE SURG SS PI 7.0 STRL IVOR (GLOVE) ×3 IMPLANT
GOWN STRL REUS W/ TWL LRG LVL3 (GOWN DISPOSABLE) ×8 IMPLANT
GOWN STRL REUS W/TWL LRG LVL3 (GOWN DISPOSABLE) ×4
LIQUID BAND (GAUZE/BANDAGES/DRESSINGS) IMPLANT
NDL SAFETY ECLIPSE 18X1.5 (NEEDLE) IMPLANT
NEEDLE HYPO 18GX1.5 SHARP (NEEDLE)
NEEDLE HYPO 25X1 1.5 SAFETY (NEEDLE) ×3 IMPLANT
NEEDLE HYPO 25X5/8 SAFETYGLIDE (NEEDLE) IMPLANT
NS IRRIG 1000ML POUR BTL (IV SOLUTION) ×3 IMPLANT
PACK BASIN DAY SURGERY FS (CUSTOM PROCEDURE TRAY) ×3 IMPLANT
PENCIL BUTTON HOLSTER BLD 10FT (ELECTRODE) ×3 IMPLANT
SHEET MEDIUM DRAPE 40X70 STRL (DRAPES) ×3 IMPLANT
SLEEVE SCD COMPRESS KNEE MED (MISCELLANEOUS) ×3 IMPLANT
SPONGE GAUZE 4X4 12PLY STER LF (GAUZE/BANDAGES/DRESSINGS) IMPLANT
SPONGE LAP 18X18 X RAY DECT (DISPOSABLE) ×3 IMPLANT
SPONGE LAP 4X18 X RAY DECT (DISPOSABLE) IMPLANT
STAPLER VISISTAT 35W (STAPLE) IMPLANT
STOCKINETTE IMPERVIOUS LG (DRAPES) IMPLANT
STRIP CLOSURE SKIN 1/2X4 (GAUZE/BANDAGES/DRESSINGS) ×3 IMPLANT
SUT ETHILON 3 0 PS 1 (SUTURE) ×21 IMPLANT
SUT MNCRL AB 3-0 PS2 18 (SUTURE) ×3 IMPLANT
SUT MNCRL AB 4-0 PS2 18 (SUTURE) IMPLANT
SUT SILK 2 0 FS (SUTURE) ×6 IMPLANT
SUT VIC AB 3-0 SH 27 (SUTURE) ×2
SUT VIC AB 3-0 SH 27X BRD (SUTURE) ×4 IMPLANT
SUT VIC AB 4-0 BRD 54 (SUTURE) IMPLANT
SUT VICRYL 3-0 CR8 SH (SUTURE) ×3 IMPLANT
SYR CONTROL 10ML LL (SYRINGE) ×3 IMPLANT
SYR TB 1ML LL NO SAFETY (SYRINGE) IMPLANT
TOWEL OR 17X24 6PK STRL BLUE (TOWEL DISPOSABLE) ×6 IMPLANT
TOWEL OR NON WOVEN STRL DISP B (DISPOSABLE) IMPLANT
TUBE CONNECTING 20X1/4 (TUBING) ×3 IMPLANT
YANKAUER SUCT BULB TIP NO VENT (SUCTIONS) ×3 IMPLANT

## 2016-05-23 NOTE — Anesthesia Preprocedure Evaluation (Signed)
Anesthesia Evaluation  Patient identified by MRN, date of birth, ID band Patient awake    Reviewed: Allergy & Precautions, NPO status , Patient's Chart, lab work & pertinent test results  History of Anesthesia Complications (+) PONV and history of anesthetic complications  Airway Mallampati: II  TM Distance: >3 FB Neck ROM: Full    Dental no notable dental hx.    Pulmonary neg pulmonary ROS,    Pulmonary exam normal        Cardiovascular negative cardio ROS Normal cardiovascular exam     Neuro/Psych negative neurological ROS     GI/Hepatic negative GI ROS, Neg liver ROS,   Endo/Other  Hypothyroidism   Renal/GU negative Renal ROS     Musculoskeletal negative musculoskeletal ROS (+)   Abdominal   Peds  Hematology negative hematology ROS (+)   Anesthesia Other Findings Day of surgery medications reviewed with the patient.  Reproductive/Obstetrics                             Anesthesia Physical Anesthesia Plan  ASA: I  Anesthesia Plan: General   Post-op Pain Management:    Induction: Intravenous  Airway Management Planned: LMA  Additional Equipment: None  Intra-op Plan:   Post-operative Plan: Extubation in OR  Informed Consent: I have reviewed the patients History and Physical, chart, labs and discussed the procedure including the risks, benefits and alternatives for the proposed anesthesia with the patient or authorized representative who has indicated his/her understanding and acceptance.   Dental advisory given  Plan Discussed with:   Anesthesia Plan Comments:         Anesthesia Quick Evaluation

## 2016-05-23 NOTE — Anesthesia Postprocedure Evaluation (Signed)
Anesthesia Post Note  Patient: Sharon Kirk  Procedure(s) Performed: Procedure(s) (LRB): WIDE  EXCISION RIGHT SHOULDER MELANOMA AND WIDE EXCISION LEFT LOWER LEG MELANOMA (Right) LEFT INGUINAL SENTINEL LYMPH  NODE BIOPSY (Left)  Patient location during evaluation: PACU Anesthesia Type: General Level of consciousness: awake and alert Pain management: pain level controlled Vital Signs Assessment: post-procedure vital signs reviewed and stable Respiratory status: spontaneous breathing, nonlabored ventilation, respiratory function stable and patient connected to nasal cannula oxygen Cardiovascular status: blood pressure returned to baseline and stable Postop Assessment: no signs of nausea or vomiting Anesthetic complications: no    Last Vitals:  Vitals:   05/23/16 1345 05/23/16 1400  BP: 122/75 126/78  Pulse: 69 64  Resp: 16 10  Temp:      Last Pain:  Vitals:   05/23/16 1400  TempSrc:   PainSc: 0-No pain                 Reginal Lutes

## 2016-05-23 NOTE — Progress Notes (Signed)
Emotional support during injections 

## 2016-05-23 NOTE — Transfer of Care (Signed)
Immediate Anesthesia Transfer of Care Note  Patient: Sharon Kirk  Procedure(s) Performed: Procedure(s) with comments: WIDE  EXCISION RIGHT SHOULDER MELANOMA AND WIDE EXCISION LEFT LOWER LEG MELANOMA (Right) - right shoulder and left lower leg sites LEFT INGUINAL SENTINEL LYMPH  NODE BIOPSY (Left)  Patient Location: PACU  Anesthesia Type:General  Level of Consciousness: sedated  Airway & Oxygen Therapy: Patient Spontanous Breathing and Patient connected to face mask oxygen  Post-op Assessment: Report given to RN and Post -op Vital signs reviewed and stable  Post vital signs: Reviewed and stable  Last Vitals:  Vitals:   05/23/16 1000 05/23/16 1015  BP: 105/76 100/74  Pulse: 63 (!) 54  Resp: 18 (!) 9  Temp:      Last Pain:  Vitals:   05/23/16 0905  TempSrc: Oral      Patients Stated Pain Goal: 0 ([id number])  Complications: No apparent anesthesia complications

## 2016-05-23 NOTE — Discharge Instructions (Addendum)
Apply ice packs to all areas for 3 days as much as possible.  No heavy or strenuous activity until released to do so by MD.  Keep the bandages dry.  Remove the shoulder and calf  bandages in 3 days .  You may then shower and apply a dry bandage to these areas daily.  Remove the left groin bandage in 5 days.  No lifting over 10 pounds with right arm until stitches are removed.  Also, minimize lifting your right arm over your head.  Call for heavy bleeding from wounds, significant drainage from wounds or signs of infection as we discussed [id number]  Take pain medication as directed.  May also take Ibuprofen.  May return to desk work in 5-7 days if comfortable and not taking pain medicine.       Post Anesthesia Home Care Instructions  Activity: Get plenty of rest for the remainder of the day. A responsible adult should stay with you for 24 hours following the procedure.  For the next 24 hours, DO NOT: -Drive a car -Paediatric nurse -Drink alcoholic beverages -Take any medication unless instructed by your physician -Make any legal decisions or sign important papers.  Meals: Start with liquid foods such as gelatin or soup. Progress to regular foods as tolerated. Avoid greasy, spicy, heavy foods. If nausea and/or vomiting occur, drink only clear liquids until the nausea and/or vomiting subsides. Call your physician if vomiting continues.  Special Instructions/Symptoms: Your throat may feel dry or sore from the anesthesia or the breathing tube placed in your throat during surgery. If this causes discomfort, gargle with warm salt water. The discomfort should disappear within 24 hours.  If you had a scopolamine patch placed behind your ear for the management of post- operative nausea and/or vomiting:  1. The medication in the patch is effective for 72 hours, after which it should be removed.  Wrap patch in a tissue and discard in the trash. Wash hands thoroughly with soap and  water. 2. You may remove the patch earlier than 72 hours if you experience unpleasant side effects which may include dry mouth, dizziness or visual disturbances. 3. Avoid touching the patch. Wash your hands with soap and water after contact with the patch.

## 2016-05-23 NOTE — Anesthesia Procedure Notes (Signed)
Procedure Name: LMA Insertion Date/Time: 05/23/2016 11:19 AM Performed by: Lieutenant Diego Pre-anesthesia Checklist: Patient identified, Emergency Drugs available, Suction available and Patient being monitored Patient Re-evaluated:Patient Re-evaluated prior to inductionOxygen Delivery Method: Circle system utilized Preoxygenation: Pre-oxygenation with 100% oxygen Intubation Type: IV induction Ventilation: Mask ventilation without difficulty LMA: LMA inserted LMA Size: 4.0 Number of attempts: 1 Airway Equipment and Method: Bite block Placement Confirmation: positive ETCO2 and breath sounds checked- equal and bilateral Tube secured with: Tape Dental Injury: Teeth and Oropharynx as per pre-operative assessment

## 2016-05-23 NOTE — Op Note (Signed)
Operative Note  TREAZURE SCHONE female 55 y.o. 05/23/2016  PREOPERATIVE DX:  1. Intermediate thickness melanoma left posterior calf. 2. Thin melanoma right shoulder  POSTOPERATIVE DX:  Same  PROCEDURE:   1. Left inguinal lymphatic mapping. 2.  Wide local excision of left posterior calf melanoma. 3. Wide local excision of right shoulder melanoma. 4. Left inguinal sentinel left biopsy (2 lymph nodes).         Surgeon: Odis Hollingshead   Assistants: none  Anesthesia: General LMA anesthesia  Indications:   This is a 55 year old female who had multiple moles biopsied. A thin melanoma was discovered on the right shoulder. The intermediate thickness melanoma was discovered  on the left posterior calf. She now presents for the above procedures.    Procedure Detail:  She underwent a radioactive injection around the calf melanoma. She was seen in the holding area. The right shoulder, left groin, and left calf were marked with my initials. She is brought to the operating room placed supine on the operating table and a general anesthetic was administered.  Lymphatic mapping, using the neoprobe count was performed at the left groin area and 2 areas of increased counts were noted. One in the inferior groin and one in superior groin.    The left lower extremity was internally rotated  And padded appropriately exploding the left calf area. This area was sterilely prepped and draped.  A timeout was performed.  2 cm of measured from the margins of the biopsy site. Local anesthetic consisting of Percent plain Marcaine was infiltrated. An elliptical incision was made through the skin, subcutaneous tissue all the way down to the fascia. The specimen was then dissected free from the underlying fascia.  The 12:00 aspect of the fascia was marked with a silk suture and then passed off the field to be sent to pathology. I undermined some of the skin and subcutaneous tissue to decrease tension on the closure. Bleeding  was controlled electrocautery. Once hemostasis was adequate, I reapproximated the skin with interrupted  3-0 nylon sutures. Antibiotic ointment and a sterile dressing were applied.  The hair in the left groin was clipped. The right shoulder and left groin were then sterilely prepped and draped. Another timeout was performed.  The right shoulder was approached.  1 cm was measured away from the biopsy site edges.  Local anesthetic was infiltrated in the skin and subcutaneous tissues around the area. An elliptical incision was then made through the skin and subcutaneous tissue down to the fascia and  The specimen was dissected free from the fascia using electrocautery. The 12:00 portion of the specimen was marked with a silk suture. The specimen was then handed off the field to be taken to pathology. Bleeding was controlled electrocautery. Once hemostasis was adequate, the skin was approximated with interrupted 3-0 nylon sutures. Subsequently, antibiotic ointment and sterile dressing were applied.  Next, left groin was approached. A neoprobe was used to localize to areas of increased activity. A longitudinal incision was made just lateral to the groin crease after infiltration of local anesthetic. Subcutaneous tissues were dissected with electrocautery.The inferior lymph node with increased counts was identified first. Lymphatics were ligated with 3-0 Vicryl ties and the lymph node was removed. Following this, the area of the superior increased counts were identified. Using blunt dissection I was able to find a second lymph node that was palpable and had increased counts. Lymphatics were ligated with 3-0 Vicryl ties and the lymph node was then removed. Both  sentinel lymph nodes were then passed off the field to be sent to pathology. No other areas of increased counts in the left groin were noted.  Left groin wound was then irrigated. Hemostasis was adequate. The subcutaneous tissues were closed in 2 layers with  running 3-0 Vicryl suture. The skin was closed with a running 3-0 Monocryl subcuticular stitch. Steri-Strips and a sterile dressing were applied.  She tolerated all procedures well without any complications. She was taken to the recovery room in satisfactory condition. Estimated blood loss was less than 200 cc.         Specimens: Right shoulder tissue, left posterior calf tissue, 2 left inguinal sentinel lymph nodes.        Complications:  * No complications entered in OR log *         Disposition: PACU - hemodynamically stable.         Condition: stable

## 2016-05-23 NOTE — Interval H&P Note (Signed)
History and Physical Interval Note:  05/23/2016 11:00 AM  Sharon Kirk  has presented today for surgery, with the diagnosis of Melanomas  The various methods of treatment have been discussed with the patient and family. After consideration of risks, benefits and other options for treatment, the patient has consented to  Procedure(s): WIDE  EXCISION RIGHT SHOULDER MELANOMA AND WIDE EXCISION LEFT LOWER LEG MELANOMA (N/A) LEFT INGUINAL SENTINEL LYMPH  NODE BIOPSY (Left) as a surgical intervention .  The patient's history has been reviewed, patient examined, no change in status, stable for surgery.  I have reviewed the patient's chart and labs.  Questions were answered to the patient's satisfaction.     Felissa Blouch [name]

## 2016-05-23 NOTE — H&P (View-Only) (Signed)
Sharon Kirk 05/09/2016 11:23 AM Location: [facility] Patient #: [id number] DOB: 1960-10-24 Married / Language: English / Race: White Female  History of Present Illness Odis Hollingshead MD; 05/09/2016 12:12 PM) Patient words: Melanoma, left lower leg.  The patient is a 55 year old female.   Note:She is referred by Dr. Pablo Ledger for consultation regarding an immediate thickness melanoma of the left lower leg and a thin melanoma of the right shoulder. She went to be evaluated because of some atypical moles and multiple shave biopsies were done. She had 2 melanomas as above. The left lower leg melanoma is 1.25 mm thick with peripheral margins involved and deep margin free of tumor. There is some ulceration present. No satellitosis. The right shoulder melanoma is 0.3 mm thick with pressure margins involved but deep margin free of tumor. No ulceration or satellitosis. She denies getting any sun burns to these areas. She denies a family history of melanoma.  Allergies (April Staton, CMA; 05/09/2016 11:28 AM) OxyCODONE HCl *ANALGESICS - OPIOID* HYDROcodone Bitartrate *CHEMICALS* Codeine Phosphate *ANALGESICS - OPIOID* Nausea.  Medication History (April Staton, CMA; 05/09/2016 11:29 AM) Synthroid (75MCG Tablet, Oral) Active. Estradiol (0.0375MG /24HR Patch TW, Transdermal) Active. Calcium Carbonate (600MG  Tablet, Oral daily) Active. Vitamin D (1000UNIT Tablet, Oral daily) Active. Levothyroxine Sodium (50MCG Tablet, Oral daily) Active. Magnesium (200MG  Tablet, Oral daily) Active. (Pt unsure of mg strength) Fish Oil (1200MG  Capsule, Oral daily) Active. Claritin (5MG /5ML Syrup, Oral as directed) Active. Medications Reconciled     Review of Systems Odis Hollingshead MD; 05/09/2016 12:17 PM)  Note: Denies any sun burns to the areas.  No difficulty with enlarged thyroid since I saw her last.   Vitals (April Staton CMA; 05/09/2016 11:29 AM) 05/09/2016  11:29 AM Weight: 160.25 lb Height: 65in Height was reported by patient. Body Surface Area: 1.8 m Body Mass Index: 26.67 kg/m  Temp.: 97.56F(Oral)  Pulse: 67 (Regular)  P.OX: 98% (Room air) BP: 122/72 (Sitting, Left Arm, Standard)      Physical Exam Odis Hollingshead MD; 05/09/2016 12:14 PM)  The physical exam findings are as follows: Note:General: WDWN in NAD. Pleasant and cooperative.  HEENT: [place]/AT, no external nasal or ear masses, mucous membranes are moist  CV: RRR, no murmur, no edema  ABDOMEN: Soft, nontender, nondistended, no masses, no organomegaly  MUSCULOSKELETAL: FROM, good muscle tone, no edema, no venous stasis changes, normal station and gait  LYMPHATIC: No palpable inguinal adenopathy.  SKIN: 1.2 cm open wound left lateral lower leg that is clean. 1 cm healing wound right shoulder adjacent to a mole.  NEUROLOGIC: Alert and oriented, answers questions appropriately, normal gait and station.  PSYCHIATRIC: Normal mood, affect , and behavior.    Assessment & Plan Odis Hollingshead MD; 05/09/2016 12:12 PM)  MELANOMA OF SHOULDER, RIGHT (C43.61) Impression: This is thin (0.3 mm). Margins involved.  Plan: Wide excision with 1 cm margins.  MELANOMA OF LOWER LEG, LEFT (C43.72) Impression: This is intermediate thickness (1.25 mm). Peripheral margins positive.  Plan: Wide local excision with 1-2 cm margins. Left inguinal sentinel lymph node biopsy. I discussed the procedures and risks with her. Risks include but not limited to bleeding, infection, wound healing problems, need for partial open wound and skin graft in the future, excessive bleeding, nerve injury and weakness of left lower leg, left inguinal seroma, left lower extremity edema. We did discuss that if the mapping was negative for the left leg melanoma, I would not proceed with any lymph node biopsy. She  seems to understand all this and would like to proceed.  Jackolyn Confer, MD

## 2016-05-25 ENCOUNTER — Encounter (HOSPITAL_BASED_OUTPATIENT_CLINIC_OR_DEPARTMENT_OTHER): Payer: Self-pay | Admitting: General Surgery

## 2016-05-30 ENCOUNTER — Encounter: Payer: Self-pay | Admitting: General Surgery

## 2016-05-30 NOTE — Progress Notes (Signed)
Sharon Kirk 05/30/2016 3:51 PM Location: [facility] Patient #: [id number] DOB: 1961/07/08 Married / Language: English / Race: White Female  History of Present Illness Odis Hollingshead MD; 05/30/2016 4:29 PM) The patient is a 55 year old female.   Note:Procedure: 1. Left inguinal lymphatic mapping. 2. Wide local excision of left posterior calf melanoma. 3. Wide local excision of right shoulder melanoma. 4. Left inguinal sentinel left biopsy (2 lymph nodes).  Date: 05/23/16  Pathology: Shoulder->PT1aN0(clinical) superficial spreading. Left lower leg->PT2bN1 superficial spreading.  History: She presents today for her first postoperative visit and is doing well. We went over her pathology.  Exam: General- Is in NAD. Musculoskeletal-right shoulder wound is clean and intact with sutures present. Left lower leg wound clean and intact with sutures present.  GU- left groin wound clean and intact.  Allergies (Sonya Bynum, CMA; 05/30/2016 3:52 PM) OxyCODONE HCl *ANALGESICS - OPIOID* HYDROcodone Bitartrate *CHEMICALS* Codeine Phosphate *ANALGESICS - OPIOID* Nausea.  Medication History Marjean Donna, CMA; 05/30/2016 3:52 PM) Synthroid (75MCG Tablet, Oral) Active. Estradiol (0.0375MG /24HR Patch TW, Transdermal) Active. Calcium Carbonate (600MG  Tablet, Oral daily) Active. Vitamin D (1000UNIT Tablet, Oral daily) Active. Levothyroxine Sodium (50MCG Tablet, Oral daily) Active. Magnesium (200MG  Tablet, Oral daily) Active. (Pt unsure of mg strength) Fish Oil (1200MG  Capsule, Oral daily) Active. Claritin (5MG /5ML Syrup, Oral as directed) Active. Medications Reconciled    Vitals (Sonya Bynum CMA; 05/30/2016 3:51 PM) 05/30/2016 3:51 PM Weight: 160 lb Height: 65in Body Surface Area: 1.8 m Body Mass Index: 26.63 kg/m  Temp.: 97.51F(Temporal)  Pulse: 75 (Regular)  BP: 126/80 (Sitting, Left Arm, Standard)      Assessment & Plan Odis Hollingshead MD; 05/30/2016 4:21 PM)  MELANOMA OF LOWER LEG, LEFT (C43.72) Impression: PT2bN1 superficial spreading. Wound looks good.  Plan: Refer to Dr. Osker Mason. RTC one week.  MELANOMA OF SHOULDER, RIGHT (C43.61) Impression: PT1aN0(clinical)-wound healing well.  Jackolyn Confer, MD

## 2016-06-01 ENCOUNTER — Encounter: Payer: Self-pay | Admitting: Oncology

## 2016-06-01 ENCOUNTER — Telehealth: Payer: Self-pay | Admitting: Oncology

## 2016-06-01 NOTE — Telephone Encounter (Signed)
Patient returned my call to schedule appt with [facility] on Thursday 8/24 at 2pm. Patient agreed. Demographics verified. Letter to the referring and mailed to the patient.

## 2016-06-07 ENCOUNTER — Telehealth: Payer: Self-pay | Admitting: *Deleted

## 2016-06-07 NOTE — Telephone Encounter (Signed)
Turn call received from patient.  Answered questions about Valet Parking, Registation and appointment expectations.

## 2016-06-07 NOTE — Telephone Encounter (Signed)
Left message on cell phone regarding new patient appointment tomorrow with Dr. Alen Blew. Informed patient to call [phone] if she had any questions.

## 2016-06-08 ENCOUNTER — Ambulatory Visit: Payer: Self-pay | Admitting: General Surgery

## 2016-06-08 ENCOUNTER — Ambulatory Visit (HOSPITAL_BASED_OUTPATIENT_CLINIC_OR_DEPARTMENT_OTHER): Payer: BLUE CROSS/BLUE SHIELD | Admitting: Oncology

## 2016-06-08 ENCOUNTER — Telehealth: Payer: Self-pay | Admitting: Oncology

## 2016-06-08 DIAGNOSIS — C439 Malignant melanoma of skin, unspecified: Secondary | ICD-10-CM

## 2016-06-08 DIAGNOSIS — C4361 Malignant melanoma of right upper limb, including shoulder: Secondary | ICD-10-CM

## 2016-06-08 DIAGNOSIS — C4372 Malignant melanoma of left lower limb, including hip: Secondary | ICD-10-CM

## 2016-06-08 NOTE — H&P (Signed)
Sharon Kirk 06/08/2016 11:00 AM Location: [facility] Patient #: [id number] DOB: 1961/03/26 Married / Language: English / Race: White Female  History of Present Illness Odis Hollingshead MD; 06/08/2016 12:09 PM) The patient is a 55 year old female.   Note: Procedure: 1. Left inguinal lymphatic mapping. 2. Wide local excision of left posterior calf melanoma. 3. Wide local excision of right shoulder melanoma. 4. Left inguinal sentinel left biopsy (2 lymph nodes).  Date: 05/23/16  Pathology: Shoulder->PT1aN0(clinical) superficial spreading. Left lower leg->PT2bN1 superficial spreading.  History: She presents today for another postop visit. She noticed some swelling in her left foot that began 2 days ago and is noticed in the evening. Otherwise she is doing well. She has an appointment with Dr. Alen Blew this afternoon.  Exam: General- Is in NAD. Musculoskeletal-right shoulder wound is clean and intact with sutures present. Left lower leg wound clean and intact with sutures present. All sutures were removed, benzoin and steri strips were applied. Slight left foot swelling noted.  GU- left groin wound clean and intact.  Allergies (Sonya Bynum, CMA; 06/08/2016 11:00 AM) OxyCODONE HCl *ANALGESICS - OPIOID* HYDROcodone Bitartrate *CHEMICALS* Codeine Phosphate *ANALGESICS - OPIOID* Nausea.  Medication History Marjean Donna, CMA; 06/08/2016 11:00 AM) Synthroid (75MCG Tablet, Oral) Active. Estradiol (0.0375MG /24HR Patch TW, Transdermal) Active. Calcium Carbonate (600MG  Tablet, Oral daily) Active. Vitamin D (1000UNIT Tablet, Oral daily) Active. Levothyroxine Sodium (50MCG Tablet, Oral daily) Active. Magnesium (200MG  Tablet, Oral daily) Active. (Pt unsure of mg strength) Fish Oil (1200MG  Capsule, Oral daily) Active. Claritin (5MG /5ML Syrup, Oral as directed) Active. Medications Reconciled    Vitals (Sonya Bynum CMA; 06/08/2016 11:00 AM) 06/08/2016 11:00  AM Weight: 159 lb Height: 65in Body Surface Area: 1.79 m Body Mass Index: 26.46 kg/m  Temp.: 97.56F(Temporal)  Pulse: 68 (Regular)  BP: 124/76 (Sitting, Left Arm, Standard)      Assessment & Plan Odis Hollingshead MD; 06/08/2016 12:11 PM)  POST-OPERATIVE STATE [phone]) Impression: All wounds are healing well. Some swelling in the left foot.  Plan: Start resume her normal activities. We will send her to the vascular lab for a left lower extremity duplex study just to make sure she doesn't have a DVT. Return visit one month.  Current Plans Follow up with [place] in the office in 1 month.  Jackolyn Confer, MD

## 2016-06-08 NOTE — Telephone Encounter (Signed)
Gave patient avs report and appointments for August thru October. Central radiology will call re scan - patient aware.

## 2016-06-08 NOTE — Progress Notes (Signed)
Reason for Referral: Melanoma.   HPI: 55 year old woman currently lives in [place] area and have done so for many years. She is a rather healthy woman with history of thyroid nodules and currently taking thyroid supplement regarding that. She noted to have a lesion on her left calf and was evaluated by dermatology. And during her skin examination shows found to have 2 lesions. One on her right shoulder and the other one was on her left lower leg and both biopsied and found to be melanoma. Based on these findings she was referred to Dr. Zella Richer for wide surgical excision and sentinel lymph node sampling. On 05/23/2016 she underwent wide local excision of a left posterior calf melanoma as well as of the right shoulder melanoma. Left inguinal sentinel lymph node sampling was also completed. The pathology was as follows:    Her right shoulder melanoma was measuring 0.72 mm with Clark level III. No lymphovascular invasion or ulceration. The final pathological staging was T1 NA.  Her left lower leg melanoma showed a 1.25 mm maximal thickness without ulceration and no lymphovascular invasion. 2 lymph nodes were sampled with one lymph node showed positive for metastatic malignant melanoma measuring 0.25 x 0.95 mm in dimension.  She recovered fairly well from this procedure and had her stitches removed on 06/08/2016. She resumed work-related duties without any hindrance or decline.   She denied any previous skin cancer history. She denied any excessive skin exposure but she does have fair skin with multiple benign nevi.  She does not report any headaches, blurry vision, syncope or seizures. She does not report any fevers, chills, sweats or weight loss. She does not report any chest pain, palpitation orthopnea or leg edema. She does not report any cough, wheezing or hemoptysis. She is not abort any nausea, vomiting or abdominal pain. She does not report any frequency urgency or hesitancy. She does not  report any skeletal complaints. Remaining review of systems unremarkable.   Past Medical History:  Diagnosis Date  . Allergy   . IBS (irritable bowel syndrome)   . PONV (postoperative nausea and vomiting)   . Thyroid nodule   :  Past Surgical History:  Procedure Laterality Date  . ABDOMINAL HYSTERECTOMY  1991  . DILATION AND CURETTAGE OF UTERUS  1988  . MELANOMA EXCISION Right 05/23/2016   Procedure: WIDE  EXCISION RIGHT SHOULDER MELANOMA AND WIDE EXCISION LEFT LOWER LEG MELANOMA;  Surgeon: Jackolyn Confer, MD;  Location: [facility];  Service: General;  Laterality: Right;  right shoulder and left lower leg sites  . SENTINEL NODE BIOPSY Left 05/23/2016   Procedure: LEFT INGUINAL SENTINEL LYMPH  NODE BIOPSY;  Surgeon: Jackolyn Confer, MD;  Location: [facility];  Service: General;  Laterality: Left;  . THYROID SURGERY  07/2015   NODULE  :   Current Outpatient Prescriptions:  .  calcium carbonate (OS-CAL) 600 MG TABS, Take 600 mg by mouth 2 (two) times daily with a meal.  , Disp: , Rfl:  .  cholecalciferol (VITAMIN D) 1000 UNITS tablet, Take 5,000 Units by mouth daily. , Disp: , Rfl:  .  Cyanocobalamin (VITAMIN B-12 CR PO), Take 1 tablet by mouth daily., Disp: , Rfl:  .  estradiol (VIVELLE-DOT) 0.0375 MG/24HR, Place 1 patch onto the skin 2 (two) times a week., Disp: 24 patch, Rfl: 4 .  HYDROcodone-acetaminophen (NORCO/VICODIN) 5-325 MG tablet, Take 1-2 tablets by mouth every 4 (four) hours as needed for moderate pain or severe pain., Disp: 40 tablet,  Rfl: 0 .  levothyroxine (SYNTHROID, LEVOTHROID) 50 MCG tablet, Take 50 mcg by mouth daily before breakfast., Disp: , Rfl:  .  MAGNESIUM PO, Take 1 tablet by mouth every other day., Disp: , Rfl:  .  Omega-3 Fatty Acids (FISH OIL) 1200 MG CAPS, Take 2 capsules by mouth daily. , Disp: , Rfl:  .  ondansetron (ZOFRAN) 4 MG tablet, Take 1 tablet (4 mg total) by mouth every 8 (eight) hours as needed for nausea., Disp: 20  tablet, Rfl: 0 .  OVER THE COUNTER MEDICATION, Multiple vitamin qd , Disp: , Rfl: :  Allergies  Allergen Reactions  . Codeine Nausea Only and Other (See Comments)    Hallucinations; confusion  :  Family History  Problem Relation Age of Onset  . Diabetes Brother   . Hypertension Brother   . Cancer Maternal Grandmother     COLON  . Cancer Paternal Grandfather     PROSTATE  . Hypertension Mother   . Diabetes Brother   . Heart attack Father   :  Social History   Social History  . Marital status: Married    Spouse name: Audiological scientist  . Number of children: 0  . Years of education: 12   Occupational History  . CUSTOMER SERVICE [employer]   Social History Main Topics  . Smoking status: Never Smoker  . Smokeless tobacco: Never Used  . Alcohol use No  . Drug use: No  . Sexual activity: Yes    Partners: Male    Birth control/ protection: Surgical   Other Topics Concern  . Not on file   Social History Narrative   Lives with her husband.  :  Pertinent items are noted in HPI.  Exam: Blood pressure 113/68, pulse 67, temperature 97.8 F (36.6 C), temperature source Oral, resp. rate 18, height _0  (1.651 m), weight 160 lb 11.2 oz (72.9 kg), last menstrual period 06/30/1990, SpO2 100 %.  ECOG 0 General appearance: alert and cooperative appeared without distress. Head: Normocephalic, without obvious abnormality Throat: lips, mucosa, and tongue normal; teeth and gums normal Neck: no adenopathy Back: negative Resp: clear to auscultation bilaterally Chest wall: no tenderness Cardio: regular rate and rhythm, S1, S2 normal, no murmur, click, rub or gallop GI: soft, non-tender; bowel sounds normal; no masses,  no organomegaly Extremities: extremities normal, atraumatic, no cyanosis or edema. Her left calf incision appear to be well healed without erythema or induration. Skin examination: Benign nevi noted. No ulcers or lesions.   CBC    Component Value Date/Time   WBC 4.8  05/22/2016 0900   RBC 5.11 05/22/2016 0900   HGB 14.6 05/22/2016 0900   HCT 43.9 05/22/2016 0900   PLT 229 05/22/2016 0900   MCV 85.9 05/22/2016 0900   MCV 88.8 01/01/2014 1101   MCH 28.6 05/22/2016 0900   MCHC 33.3 05/22/2016 0900   RDW 13.3 05/22/2016 0900   LYMPHSABS 1.9 05/22/2016 0900   MONOABS 0.3 05/22/2016 0900   EOSABS 0.1 05/22/2016 0900   BASOSABS 0.0 05/22/2016 0900     Chemistry      Component Value Date/Time   NA 141 05/22/2016 0900   K 4.4 05/22/2016 0900   CL 104 05/22/2016 0900   CO2 30 05/22/2016 0900   BUN 9 05/22/2016 0900   CREATININE 0.51 05/22/2016 0900      Component Value Date/Time   CALCIUM 9.7 05/22/2016 0900   ALKPHOS 58 05/22/2016 0900   AST 18 05/22/2016 0900   ALT  17 05/22/2016 0900   BILITOT 0.4 05/22/2016 0900       Nm Sentinel Node Inj-no Rpt (melanoma)  Result Date: 05/23/2016 CLINICAL DATA:  Sulfur colloid was injected by the nuclear medicine technologist for melanoma sentinel node.    Assessment and Plan:   55 year old woman with the following issues:  1. Superficial spreading melanoma of the left lower extremity diagnosed in August 2017. She was found to have a 1.25 mm thickness with anatomical level III and no ulceration or lymphovascular invasion. She is status post local excision followed by sentinel lymph node sampling with 2 lymph nodes sampled one was involved with metastatic melanoma. The measurement was 0.25 x 0.95 mm in dimension of the capsule. Her final pathological stage stage is T2bN1.  The natural course of this disease was discussed today in detail with the patient and her husband. We have discussed the predisposing characteristics of her melanoma which include benign nevi as well as sun exposure any in nature. She does not have any family history of melanoma but does have history of colon cancer.  Given the advanced nature of her melanoma she will require staging workup to rule out metastatic disease. Once that is  accomplished, and no evidence of disease detected and she is documented to be stage III disease. At that time adjuvant therapy is warranted given her high risk features.  Different strategies for adjuvant therapy were reviewed which include high dose interferon, pegylated interferon on as well as ipilimumab at a higher dose currently at 10 mg/kg. Risks and benefits of these drugs were reviewed today and it was felt that she is a better candidate for it ipilimumab.  Complications associated with this medication were discussed today in detail. Autoimmune colitis, pneumonitis thyroid disease, and others a potential complications. She is in excellent health and shape and she'll be a good candidate for this therapy. She is interested in pursuing this therapy if her imaging studies do not show stage IV disease.  She will receive it ipilimumab at 10 mg/kg every 21 days for 4 total treatments. Maintenance dosing every 3 months for 3 years would be a possibility after that.  We discussed specifically autoimmune colitis as a very common feature associated with this medication. Drug discontinuation, steroid therapy among others a potential possibilities to do with these findings.  After discussion today, she is agreeable to proceed after chemotherapy education class and staging scans.  If her staging PET scan show stage IV disease, BRAF testing will be mandatory and different treatment options will be contemplated at that time.  2. Superficial spreading melanoma of the right shoulder diagnosed in August 2017. She is status post wide local excision and found to have a 0.72 mm. These findings indicate a pathological staging of T1 NA.  Her fluid will examination did not reveal any bulky adenopathy in the axilla or inguinal area and have recommended active surveillance regarding this particular melanoma.  We have discussed different strategies for skin protection and skin damage avoidance. She will continue to  follow with dermatology and Gen. surgery for active surveillance.  3. Follow-up: Will be around September 15 to start adjuvant unless her scan shows any abnormal findings.

## 2016-06-09 ENCOUNTER — Other Ambulatory Visit: Payer: Self-pay | Admitting: General Surgery

## 2016-06-09 DIAGNOSIS — C4372 Malignant melanoma of left lower limb, including hip: Secondary | ICD-10-CM

## 2016-06-15 ENCOUNTER — Encounter: Payer: Self-pay | Admitting: *Deleted

## 2016-06-15 ENCOUNTER — Other Ambulatory Visit: Payer: BLUE CROSS/BLUE SHIELD

## 2016-06-21 DIAGNOSIS — C4361 Malignant melanoma of right upper limb, including shoulder: Secondary | ICD-10-CM | POA: Diagnosis not present

## 2016-06-21 DIAGNOSIS — Z4801 Encounter for change or removal of surgical wound dressing: Secondary | ICD-10-CM | POA: Diagnosis not present

## 2016-06-21 DIAGNOSIS — C4372 Malignant melanoma of left lower limb, including hip: Secondary | ICD-10-CM | POA: Diagnosis not present

## 2016-06-21 DIAGNOSIS — T8189XA Other complications of procedures, not elsewhere classified, initial encounter: Secondary | ICD-10-CM | POA: Diagnosis not present

## 2016-06-23 DIAGNOSIS — Z4801 Encounter for change or removal of surgical wound dressing: Secondary | ICD-10-CM | POA: Diagnosis not present

## 2016-06-23 DIAGNOSIS — C4361 Malignant melanoma of right upper limb, including shoulder: Secondary | ICD-10-CM | POA: Diagnosis not present

## 2016-06-23 DIAGNOSIS — C4372 Malignant melanoma of left lower limb, including hip: Secondary | ICD-10-CM | POA: Diagnosis not present

## 2016-06-26 DIAGNOSIS — C4372 Malignant melanoma of left lower limb, including hip: Secondary | ICD-10-CM | POA: Diagnosis not present

## 2016-06-26 DIAGNOSIS — C4361 Malignant melanoma of right upper limb, including shoulder: Secondary | ICD-10-CM | POA: Diagnosis not present

## 2016-06-26 DIAGNOSIS — Z4801 Encounter for change or removal of surgical wound dressing: Secondary | ICD-10-CM | POA: Diagnosis not present

## 2016-06-28 ENCOUNTER — Encounter (HOSPITAL_COMMUNITY)
Admission: RE | Admit: 2016-06-28 | Discharge: 2016-06-28 | Disposition: A | Discharge status: Home / Self Care | Payer: BLUE CROSS/BLUE SHIELD | Source: Ambulatory Visit | Attending: Oncology | Admitting: Oncology

## 2016-06-28 DIAGNOSIS — C439 Malignant melanoma of skin, unspecified: Secondary | ICD-10-CM

## 2016-06-28 DIAGNOSIS — C4372 Malignant melanoma of left lower limb, including hip: Secondary | ICD-10-CM | POA: Diagnosis not present

## 2016-06-28 DIAGNOSIS — C4371 Malignant melanoma of right lower limb, including hip: Secondary | ICD-10-CM | POA: Diagnosis not present

## 2016-06-28 DIAGNOSIS — T8189XA Other complications of procedures, not elsewhere classified, initial encounter: Secondary | ICD-10-CM | POA: Diagnosis not present

## 2016-06-28 DIAGNOSIS — Z4801 Encounter for change or removal of surgical wound dressing: Secondary | ICD-10-CM | POA: Diagnosis not present

## 2016-06-28 DIAGNOSIS — C4361 Malignant melanoma of right upper limb, including shoulder: Secondary | ICD-10-CM | POA: Diagnosis not present

## 2016-06-28 LAB — GLUCOSE, CAPILLARY: Glucose-Capillary: 98 mg/dL (ref 65–99)

## 2016-06-28 IMAGING — PT NM PET TUM IMG INITIAL (PI) WHOLE BODY
7 of 8 series · 20 of 25 positions shown · non-contrast
Comparison: None.

CLINICAL DATA: Initial treatment strategy for two sites of melanoma
in the left posterior calf and right shoulder status post wide local
excision [DATE]. One of two left inguinal sentinel lymph nodes
was positive for metastatic malignant melanoma.

EXAM:
NUCLEAR MEDICINE PET WHOLE BODY
TECHNIQUE: 7.7 mCi F-18 FDG was injected intravenously. Full-ring PET imaging
was performed from the vertex to the feet after the radiotracer. CT
data was obtained and used for attenuation correction and anatomic
localization.
FASTING BLOOD GLUCOSE:  Value:  98 mg/dl

[Series 3: ct wb 5.0 hd_fov · axial · 5.0mm · 1.17mm/px · z∈[-380,+1408]mm · 4 of 447 slices shown]
[im 1/447  full-range]
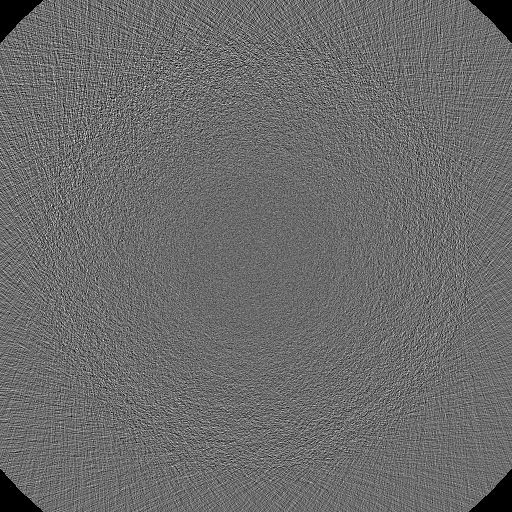
[im 112/447  soft-tissue]
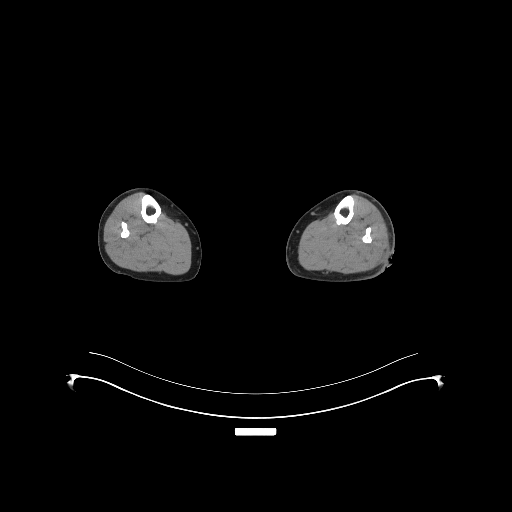
[im 335/447  soft-tissue]
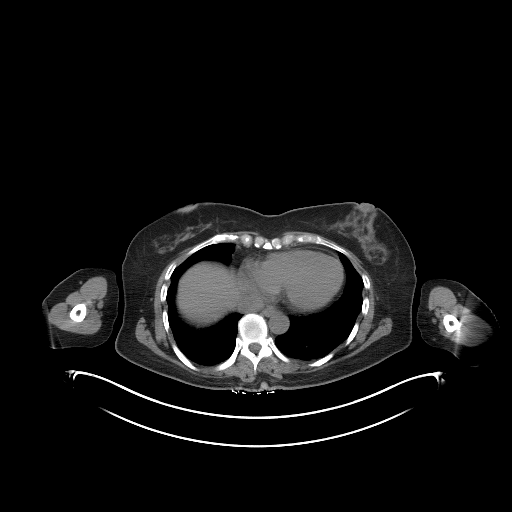
[im 447/447  soft-tissue]
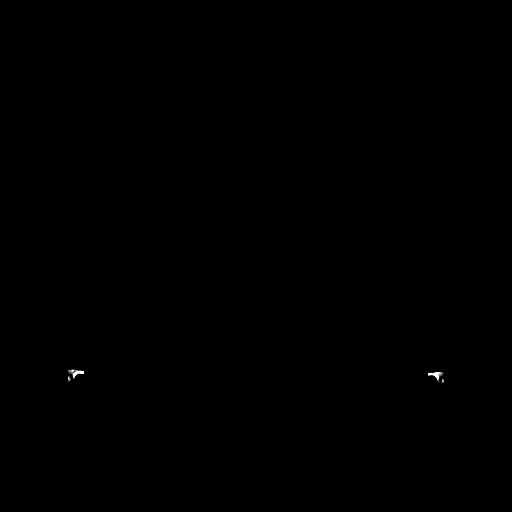

[Series 4: pet wb ac · axial · 5.0mm · 4.07mm/px · z∈[-380,+1408]mm · 4 of 448 slices shown]
[im 1/448]
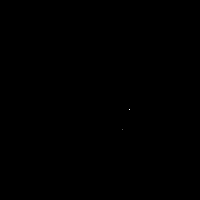
[im 112/448]
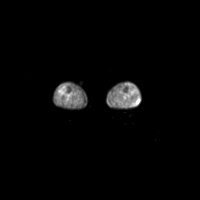
[im 336/448]
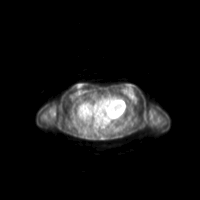
[im 448/448]
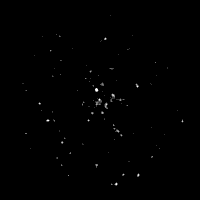

[Series 7: ct wb 5.0 b70f (id)_bone · axial · 5.0mm · 0.62mm/px · 1 of 64 slices shown]
[im 1/64  soft-tissue]
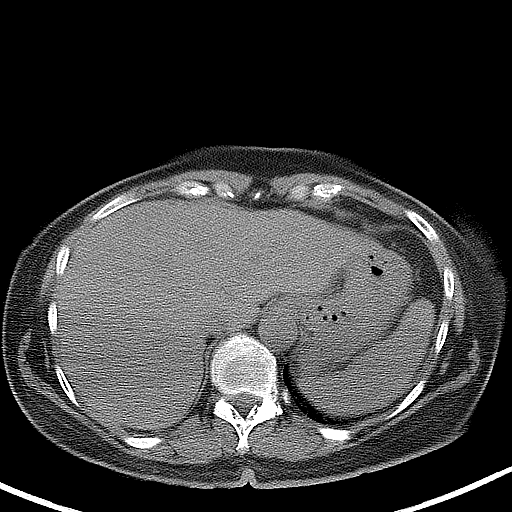

[Series 8: pet wb nac · axial · 5.0mm · 4.07mm/px · z∈[-372,+1408]mm · 5 of 446 slices shown]
[im 1/446  full-range]
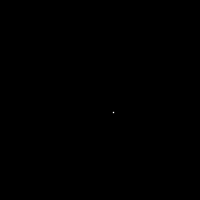
[im 179/446]
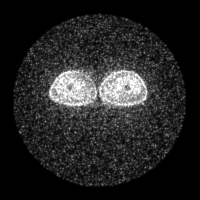
[im 268/446]
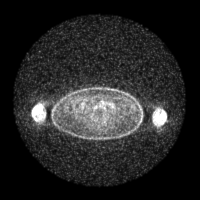
[im 357/446]
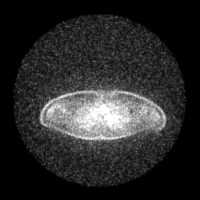
[im 446/446]
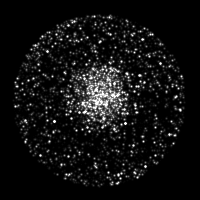

[Series 605: mip collection · coronal · 3.70mm/px · 1 of 32 slices shown]
[im 1/32]
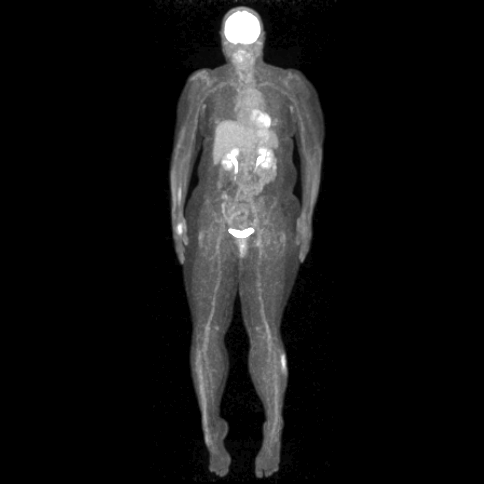

[Series 606: range-ac ct wb 5.0 hd_fov-tra-<alpha range> · 4 of 420 slices shown]
[im 1/420]
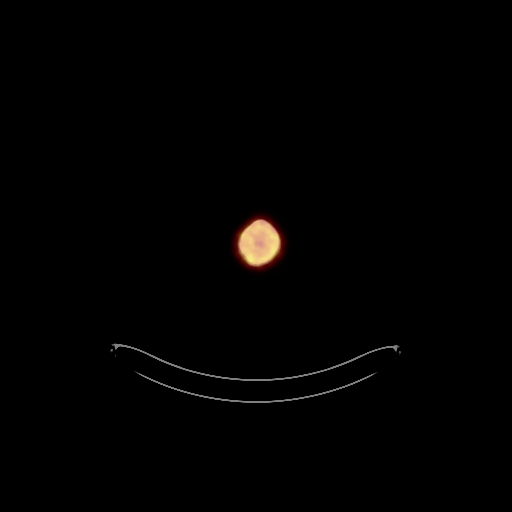
[im 105/420]
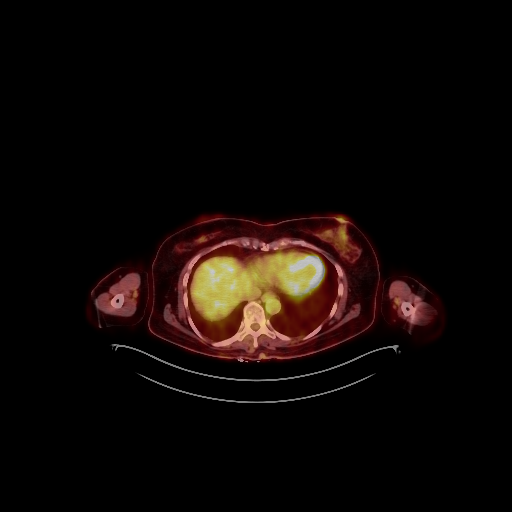
[im 210/420]
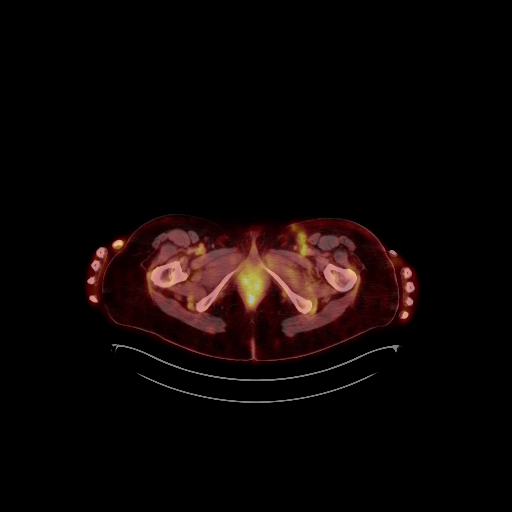
[im 420/420]
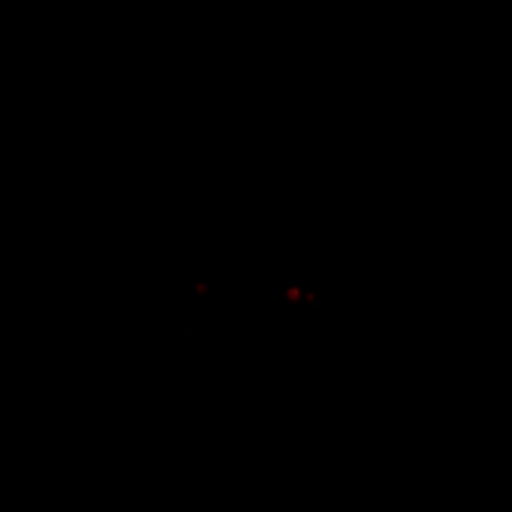

[Series 1032: results mm oncology reading · 0.60mm/px · 1 of 4 slices shown]
[im 1/4]
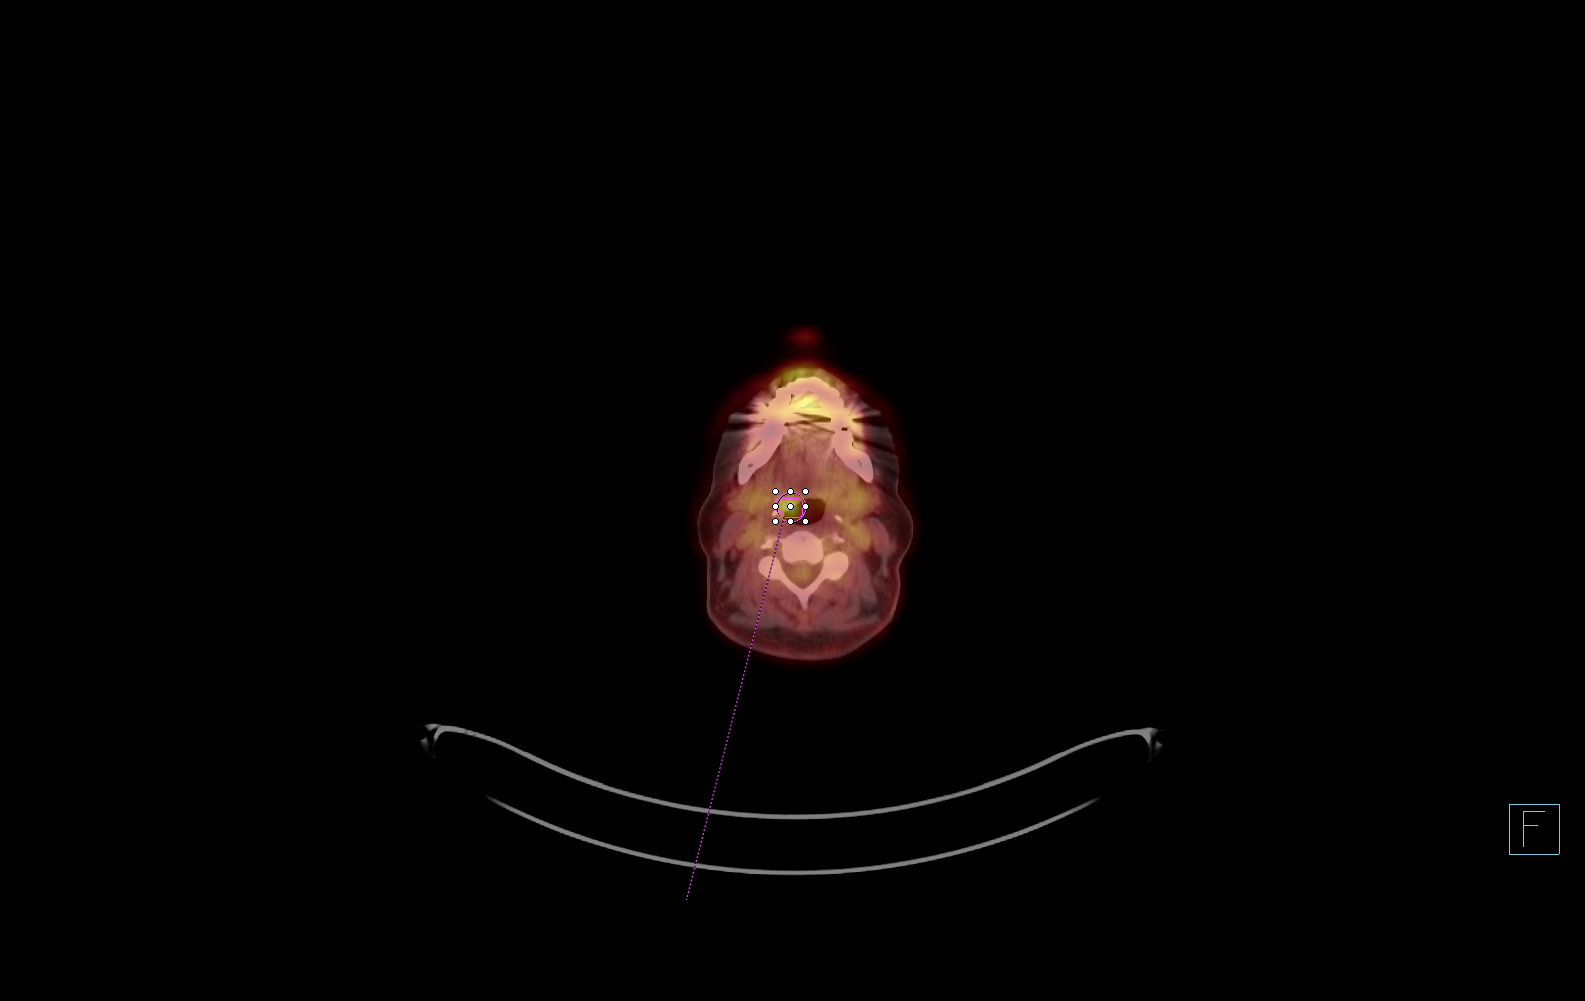

[20 of 25 positions shown; findings below may reference images not displayed]

FINDINGS: Head/Neck: No hypermetabolic lymph nodes in the neck. Non
hypermetabolic mild multinodular goiter with dominant partially
calcified 2.0 cm left thyroid lobe nodule. There is mild asymmetric
hypermetabolism at the right tongue base (max SUV 7.6) with mild
asymmetric soft tissue fullness in this location on the CT images
(series 3/image 56).

Chest: No hypermetabolic axillary, mediastinal or hilar nodes. Left
anterior descending coronary atherosclerosis. No pleural effusions.
No acute consolidative airspace disease, lung masses or significant
pulmonary nodules. Mild focal subcutaneous fat stranding in the
anterior right shoulder with low level metabolism (max SUV 3.1),
consistent with postsurgical change. No fluid collections or
discrete masses.

Abdomen/Pelvis: No abnormal hypermetabolic activity within the
liver, pancreas, adrenal glands, or spleen. No hypermetabolic lymph
nodes in the abdomen or pelvis. Hysterectomy. Mild hypermetabolism
associated with curvilinear subcutaneous fat stranding in the left
inguinal region (max SUV 4.6) is most consistent with post biopsy
change. No discrete mass or fluid collection. Atherosclerotic
nonaneurysmal abdominal aorta.

Skeleton: No focal hypermetabolic activity to suggest skeletal
metastasis.

Extremities: No hypermetabolic activity to suggest metastasis.
Hypermetabolism in the palmar right hand and right forearm is
consistent with injection related activity. Hypermetabolism (max SUV
0.3) in the lateral left calf with overlying bandage, most
consistent with postsurgical change. No discrete mass or fluid
collection.
IMPRESSION: 1. No findings suspicious for hypermetabolic metastatic disease.
2. Hypermetabolism in the lateral left calf with overlying bandage,
most consistent with postsurgical change. Low level metabolism in
the anterior right shoulder with associated subcutaneous fat
stranding, most consistent with postsurgical change. Curvilinear
subcutaneous fat stranding with associated mild hypermetabolism in
the left inguinal region, most consistent with post biopsy change.
No discrete masses or fluid collections in these locations.
3. Mild asymmetric hypermetabolism at the right tongue base with
associated mild asymmetric soft tissue fullness in this location on
the CT images. This finding is nonspecific and could represent
asymmetric tongue base lymphoid hyperplasia, although correlation
with direct visualization is advised to exclude other etiologies.
4. Additional findings include non hypermetabolic mild multinodular
goiter, 1 vessel coronary atherosclerosis and aortic
atherosclerosis.

## 2016-06-28 MED ORDER — FLUDEOXYGLUCOSE F - 18 (FDG) INJECTION
7.7400 | Freq: Once | INTRAVENOUS | Status: AC | PRN
  Administered 2016-06-28: 7.74 via INTRAVENOUS

## 2016-06-29 ENCOUNTER — Other Ambulatory Visit: Payer: Self-pay | Admitting: *Deleted

## 2016-06-30 ENCOUNTER — Encounter: Payer: Self-pay | Admitting: *Deleted

## 2016-06-30 ENCOUNTER — Other Ambulatory Visit (HOSPITAL_BASED_OUTPATIENT_CLINIC_OR_DEPARTMENT_OTHER): Payer: BLUE CROSS/BLUE SHIELD

## 2016-06-30 ENCOUNTER — Ambulatory Visit (HOSPITAL_BASED_OUTPATIENT_CLINIC_OR_DEPARTMENT_OTHER): Payer: BLUE CROSS/BLUE SHIELD | Admitting: Oncology

## 2016-06-30 ENCOUNTER — Ambulatory Visit: Payer: BLUE CROSS/BLUE SHIELD

## 2016-06-30 VITALS — BP 110/73 | HR 71 | Temp 98.2°F | Resp 18 | Ht 65.0 in | Wt 160.8 lb

## 2016-06-30 DIAGNOSIS — C4372 Malignant melanoma of left lower limb, including hip: Secondary | ICD-10-CM

## 2016-06-30 DIAGNOSIS — C4361 Malignant melanoma of right upper limb, including shoulder: Secondary | ICD-10-CM | POA: Diagnosis not present

## 2016-06-30 DIAGNOSIS — C439 Malignant melanoma of skin, unspecified: Secondary | ICD-10-CM

## 2016-06-30 DIAGNOSIS — Z4801 Encounter for change or removal of surgical wound dressing: Secondary | ICD-10-CM | POA: Diagnosis not present

## 2016-06-30 LAB — COMPREHENSIVE METABOLIC PANEL
ALT: 21 U/L (ref 0–55)
AST: 14 U/L (ref 5–34)
Albumin: 3.6 g/dL (ref 3.5–5.0)
Alkaline Phosphatase: 69 U/L (ref 40–150)
Anion Gap: 9 mEq/L (ref 3–11)
BUN: 12.2 mg/dL (ref 7.0–26.0)
CO2: 23 mEq/L (ref 22–29)
Calcium: 9.1 mg/dL (ref 8.4–10.4)
Chloride: 110 mEq/L — ABNORMAL HIGH (ref 98–109)
Creatinine: 0.7 mg/dL (ref 0.6–1.1)
EGFR: >90 mL/min/{1.73_m2} (ref >90)
Glucose: 91 mg/dl (ref 70–140)
Potassium: 4.3 mEq/L (ref 3.5–5.1)
Sodium: 143 mEq/L (ref 136–145)
Total Bilirubin: <0.3 mg/dL (ref 0.20–1.20)
Total Protein: 6.5 g/dL (ref 6.4–8.3)

## 2016-06-30 LAB — CBC WITH DIFFERENTIAL/PLATELET
BASO%: 0.9 % (ref 0.0–2.0)
Basophils Absolute: 0 10*3/uL (ref 0.0–0.1)
EOS%: 1.2 % (ref 0.0–7.0)
Eosinophils Absolute: 0.1 10*3/uL (ref 0.0–0.5)
HCT: 41 % (ref 34.8–46.6)
HGB: 13.5 g/dL (ref 11.6–15.9)
LYMPH%: 29.6 % (ref 14.0–49.7)
MCH: 28.1 pg (ref 25.1–34.0)
MCHC: 33 g/dL (ref 31.5–36.0)
MCV: 85.2 fL (ref 79.5–101.0)
MONO#: 0.3 10*3/uL (ref 0.1–0.9)
MONO%: 7.6 % (ref 0.0–14.0)
NEUT#: 2.6 10*3/uL (ref 1.5–6.5)
NEUT%: 60.7 % (ref 38.4–76.8)
Platelets: 210 10*3/uL (ref 145–400)
RBC: 4.82 10*6/uL (ref 3.70–5.45)
RDW: 13.6 % (ref 11.2–14.5)
WBC: 4.2 10*3/uL (ref 3.9–10.3)
lymph#: 1.3 10*3/uL (ref 0.9–3.3)

## 2016-06-30 NOTE — Progress Notes (Signed)
[place], [employer], Enrollment form to [phone]. Patient given her copy.

## 2016-06-30 NOTE — Progress Notes (Signed)
Per Dr. Alen Blew, I called Stacy Needle, account reimbursement at [facility], regarding patient starting Nivolumab. Her number is [phone].

## 2016-06-30 NOTE — Progress Notes (Signed)
Hematology and Oncology Follow Up Visit  Sharon Kirk [id number] May 13, 1961 55 y.o. 06/30/2016 9:14 AM Sharon Kirk, MDClark, Sharon Broach, MD   Principle Diagnosis: 55 year old woman with the following issues:  1. Superficial spreading melanoma of the lower extremity diagnosed in August 2017. She was found to have 1.25 mm thickness without ulceration or lymphovascular invasion. She had 1 out of 2 lymph nodes positive. The size of the lymph node measuring 0.25 x 0.95 mm with pathological staging T2bN1.   2. 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 These findings indicate a pathological staging of T1 NA.   Prior Therapy: She is status post local excision followed by sentinel lymph node sampling with 2 lymph nodes sampled one was involved with metastatic melanoma. The measurement was 0.25 x 0.95 mm in dimension of the capsule. Her final pathological stage stage is T2bN1. This was completed on 05/23/2016.   Current therapy:  Under evaluation for adjuvant immunotherapy.  Interim History: Sharon Kirk presents today for a follow-up visit. Since the last visit, she reports feeling very well without any major changes. She does report delayed wound healing on her left lower extremity and currently requiring a wound VAC. Otherwise she does not report any changes in her health. She remains active and perform activities of daily living. She does not report any constitutional symptoms or changes in her mental status.  She does not report any headaches, blurry vision, syncope or seizures. She does not report any fevers, chills, sweats or weight loss. She does not report any chest pain, palpitation orthopnea or leg edema. She does not report any cough, wheezing or hemoptysis. She is not abort any nausea, vomiting or abdominal pain. She does not report any frequency urgency or hesitancy. She does not report  any skeletal complaints. Remaining review of systems unremarkable.    Medications: I have reviewed the patient's current medications.  Current Outpatient Prescriptions  Medication Sig Dispense Refill  . calcium carbonate (OS-CAL) 600 MG TABS Take 600 mg by mouth 2 (two) times daily with a meal.      . cholecalciferol (VITAMIN D) 1000 UNITS tablet Take 5,000 Units by mouth daily.     . Cyanocobalamin (VITAMIN B-12 CR PO) Take 1 tablet by mouth daily.    Marland Kitchen estradiol (VIVELLE-DOT) 0.0375 MG/24HR Place 1 patch onto the skin 2 (two) times a week. 24 patch 4  . levothyroxine (SYNTHROID, LEVOTHROID) 50 MCG tablet Take 50 mcg by mouth daily before breakfast.    . MAGNESIUM PO Take 1 tablet by mouth every other day.    . Omega-3 Fatty Acids (FISH OIL) 1200 MG CAPS Take 2 capsules by mouth daily.     . ondansetron (ZOFRAN) 4 MG tablet Take 1 tablet (4 mg total) by mouth every 8 (eight) hours as needed for nausea. 20 tablet 0  . OVER THE COUNTER MEDICATION Multiple vitamin qd      No current facility-administered medications for this visit.      Allergies:  Allergies  Allergen Reactions  . Codeine Nausea Only and Other (See Comments)    Hallucinations; confusion    Past Medical History, Surgical history, Social history, and Family History were reviewed and updated.   Physical Exam: Blood pressure 110/73, pulse 71, temperature 98.2 F (36.8 C), temperature source Oral, resp. rate 18, height 5\' 5"  (1.651 m), weight 160 lb 12.8 oz (72.9 kg), last menstrual period  06/30/1990, SpO2 100 %. ECOG: 0 General appearance: alert and cooperative Head: Normocephalic, without obvious abnormality Neck: no adenopathy Lymph nodes: Cervical, supraclavicular, and axillary nodes normal. Heart:regular rate and rhythm, S1, S2 normal, no murmur, click, rub or gallop Lung:chest clear, no wheezing, rales, normal symmetric air entry Abdomin: soft, non-tender, without masses or organomegaly EXT:no erythema,  induration, or nodules. Wound VAC noted on her lower extremity.   Lab Results: Lab Results  Component Value Date   WBC 4.2 06/30/2016   HGB 13.5 06/30/2016   HCT 41.0 06/30/2016   MCV 85.2 06/30/2016   PLT 210 06/30/2016     Chemistry      Component Value Date/Time   NA 141 05/22/2016 0900   K 4.4 05/22/2016 0900   CL 104 05/22/2016 0900   CO2 30 05/22/2016 0900   BUN 9 05/22/2016 0900   CREATININE 0.51 05/22/2016 0900      Component Value Date/Time   CALCIUM 9.7 05/22/2016 0900   ALKPHOS 58 05/22/2016 0900   AST 18 05/22/2016 0900   ALT 17 05/22/2016 0900   BILITOT 0.4 05/22/2016 0900       Radiological Studies:  CLINICAL DATA:  Initial treatment strategy for two sites of melanoma in the left posterior calf and right shoulder status post wide local excision 05/23/2016. One of two left inguinal sentinel lymph nodes was positive for metastatic malignant melanoma.  EXAM: NUCLEAR MEDICINE PET WHOLE BODY  TECHNIQUE: 7.7 mCi F-18 FDG was injected intravenously. Full-ring PET imaging was performed from the vertex to the feet after the radiotracer. CT data was obtained and used for attenuation correction and anatomic localization.  FASTING BLOOD GLUCOSE:  Value:  98 mg/dl  COMPARISON:  None.  FINDINGS: Head/Neck: No hypermetabolic lymph nodes in the neck. Non hypermetabolic mild multinodular goiter with dominant partially calcified 2.0 cm left thyroid lobe nodule. There is mild asymmetric hypermetabolism at the right tongue base (max SUV 7.6) with mild asymmetric soft tissue fullness in this location on the CT images (series 3/image 56).  Chest: No hypermetabolic axillary, mediastinal or hilar nodes. Left anterior descending coronary atherosclerosis. No pleural effusions. No acute consolidative airspace disease, lung masses or significant pulmonary nodules. Mild focal subcutaneous fat stranding in the anterior right shoulder with low level metabolism  (max SUV 3.1), consistent with postsurgical change. No fluid collections or discrete masses.  Abdomen/Pelvis: No abnormal hypermetabolic activity within the liver, pancreas, adrenal glands, or spleen. No hypermetabolic lymph nodes in the abdomen or pelvis. Hysterectomy. Mild hypermetabolism associated with curvilinear subcutaneous fat stranding in the left inguinal region (max SUV 4.6) is most consistent with post biopsy change. No discrete mass or fluid collection. Atherosclerotic nonaneurysmal abdominal aorta.  Skeleton: No focal hypermetabolic activity to suggest skeletal metastasis.  Extremities: No hypermetabolic activity to suggest metastasis. Hypermetabolism in the palmar right hand and right forearm is consistent with injection related activity. Hypermetabolism (max SUV 0.3) in the lateral left calf with overlying bandage, most consistent with postsurgical change. No discrete mass or fluid collection.  IMPRESSION: 1. No findings suspicious for hypermetabolic metastatic disease. 2. Hypermetabolism in the lateral left calf with overlying bandage, most consistent with postsurgical change. Low level metabolism in the anterior right shoulder with associated subcutaneous fat stranding, most consistent with postsurgical change. Curvilinear subcutaneous fat stranding with associated mild hypermetabolism in the left inguinal region, most consistent with post biopsy change. No discrete masses or fluid collections in these locations. 3. Mild asymmetric hypermetabolism at the right tongue base with associated mild  asymmetric soft tissue fullness in this location on the CT images. This finding is nonspecific and could represent asymmetric tongue base lymphoid hyperplasia, although correlation with direct visualization is advised to exclude other etiologies. 4. Additional findings include non hypermetabolic mild multinodular goiter, 1 vessel coronary atherosclerosis and  aortic atherosclerosis.   Impression and Plan:  55 year old woman with the following issues:  1. Superficial spreading melanoma of the left lower extremity diagnosed in August 2017. She was found to have a 1.25 mm thickness with anatomical level III and no ulceration or lymphovascular invasion. She is status post local excision followed by sentinel lymph node sampling with 2 lymph nodes sampled one was involved with metastatic melanoma. The measurement was 0.25 x 0.95 mm in dimension of the capsule. Her final pathological stage stage is T2bN1.  Her PET/CT scanon 06/28/2016 was discussed today and showed no evidence of metastatic disease.  Given these findings, she is eligible for adjuvant immunotherapy. We have discussed on last visit the use of ipilimumab in the setting and she was willing to proceed. Unfortunately, she was denied this therapy by her insurance company citing lack of lymph node dissection as the reason. Despite the discussion with the medical director the denial was up held. I feel that lymph node dissection is an appropriate in this setting given the fact that she does not have any clinical lymphadenopathy and there is clear data to support no improvement and overall survival with elective lymph node dissection.  In the interim, the data from [place] 238 study that compared Nivolumab to Ipilimumab revealed significant progression of his arrival in favor of the former.  These results published online: [place] [employer] : September 10, 2017DOI: 10.1056/[id number]   Given these findings and the better tolerability of Nivolumab, the plan is to proceed with this medication instead in the near future. She will be given a dose of 3 mg/kg every 2 weeks for total of 4 doses and then every 3 months for total of 12 months.  Location so she with this medication were reviewed again and she is agreeable to proceed. I anticipate the start of this therapy the first week of  October 2017.  The reason for the delayed start, would be to allow for wound healing as well as getting the appropriate approval for this medication.   2. Superficial spreading melanoma of the right shoulder diagnosed in August 2017. She is status post wide local excision and found to have a 0.72 mm. These findings indicate a pathological staging of T1 NA.  Her physical examination does not reveal any evidence of recurrent disease. I have recommended continued follow-up with dermatology.  3. Follow-up: Will be in the next few weeks to start adjuvant therapy.   [facility], MD 9/15/20179:14 AM

## 2016-07-03 ENCOUNTER — Encounter: Payer: Self-pay | Admitting: Oncology

## 2016-07-03 DIAGNOSIS — C4361 Malignant melanoma of right upper limb, including shoulder: Secondary | ICD-10-CM | POA: Diagnosis not present

## 2016-07-03 DIAGNOSIS — Z4801 Encounter for change or removal of surgical wound dressing: Secondary | ICD-10-CM | POA: Diagnosis not present

## 2016-07-03 DIAGNOSIS — C4372 Malignant melanoma of left lower limb, including hip: Secondary | ICD-10-CM | POA: Diagnosis not present

## 2016-07-03 NOTE — Progress Notes (Signed)
Email sent to me by Darlena to enroll in drug replacement for [facility]. Reviewed chart and did not see orders for [facility]. Contacted Stacey RN and asked if patient was going to be receiving Yervoy. She states no. Relayed information back to [place].

## 2016-07-05 DIAGNOSIS — C4361 Malignant melanoma of right upper limb, including shoulder: Secondary | ICD-10-CM | POA: Diagnosis not present

## 2016-07-05 DIAGNOSIS — C4372 Malignant melanoma of left lower limb, including hip: Secondary | ICD-10-CM | POA: Diagnosis not present

## 2016-07-05 DIAGNOSIS — Z4801 Encounter for change or removal of surgical wound dressing: Secondary | ICD-10-CM | POA: Diagnosis not present

## 2016-07-12 ENCOUNTER — Ambulatory Visit
Admission: RE | Admit: 2016-07-12 | Discharge: 2016-07-12 | Disposition: A | Discharge status: Home / Self Care | Payer: BLUE CROSS/BLUE SHIELD | Source: Ambulatory Visit | Attending: General Surgery | Admitting: General Surgery

## 2016-07-12 DIAGNOSIS — C4372 Malignant melanoma of left lower limb, including hip: Secondary | ICD-10-CM

## 2016-07-12 DIAGNOSIS — R600 Localized edema: Secondary | ICD-10-CM | POA: Diagnosis not present

## 2016-07-12 IMAGING — US US EXTREM LOW VENOUS*L*
1 series · 13 of 24 positions shown · non-contrast
Comparison: None.

CLINICAL DATA: Left calf melanoma removed on [DATE] with a
wound VAC with persistent left lower extremity edema. Evaluate for
DVT.



[Series 1: us extrem low venous*left* · 13 of 35 slices shown]
[im 1/35]
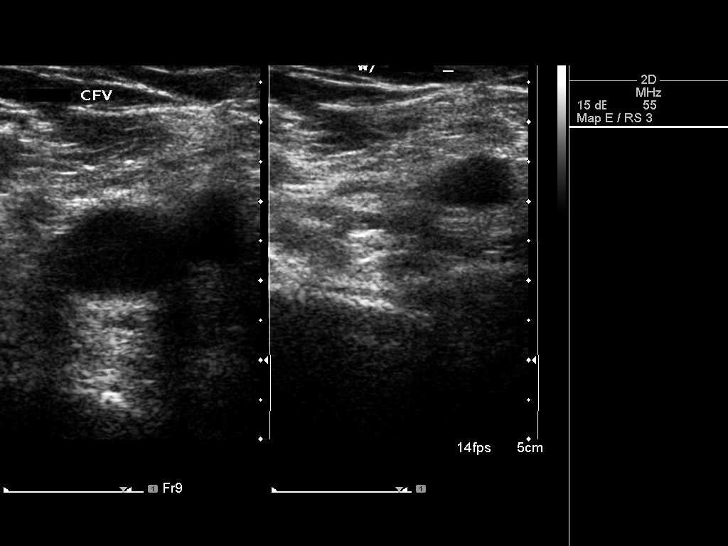
[im 3/35]
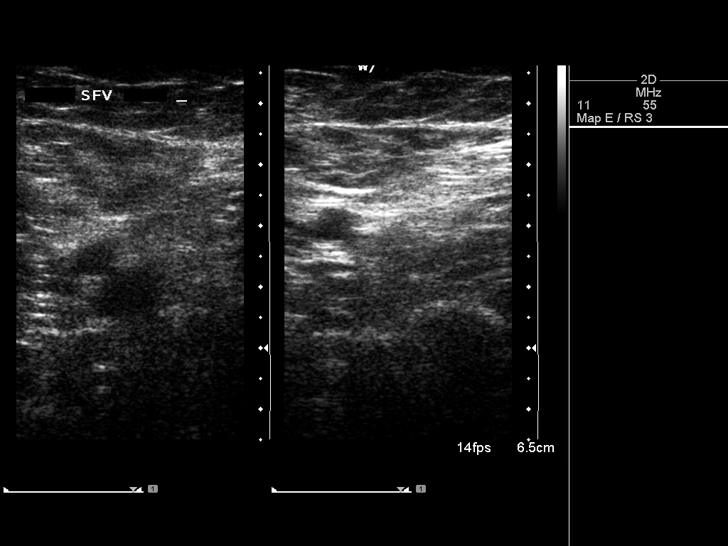
[im 6/35]
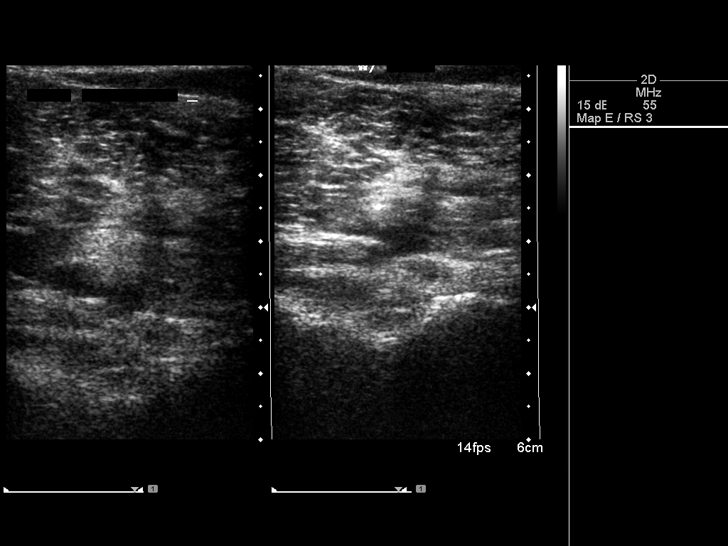
[im 9/35]
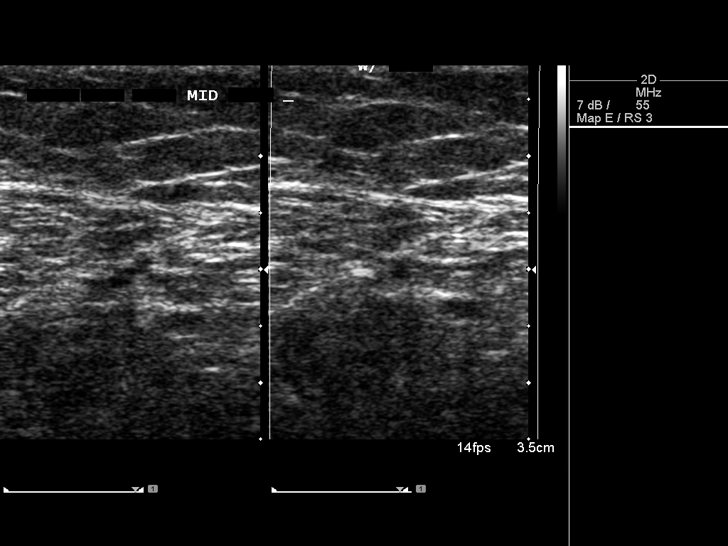
[im 12/35]
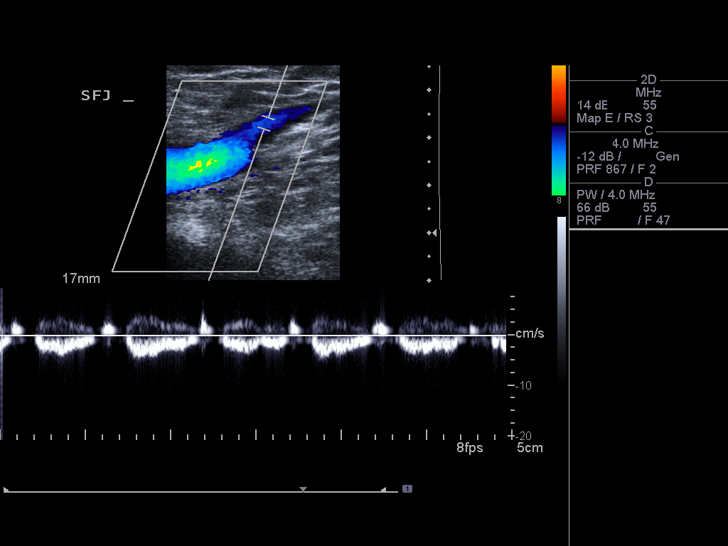
[im 15/35]
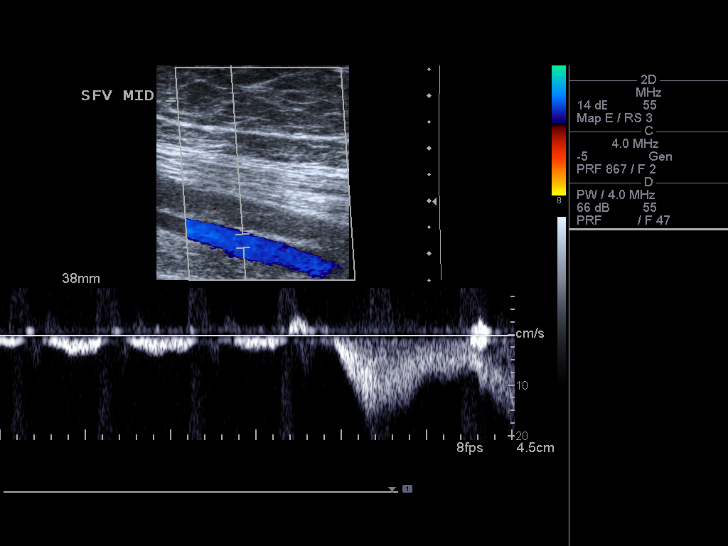
[im 18/35]
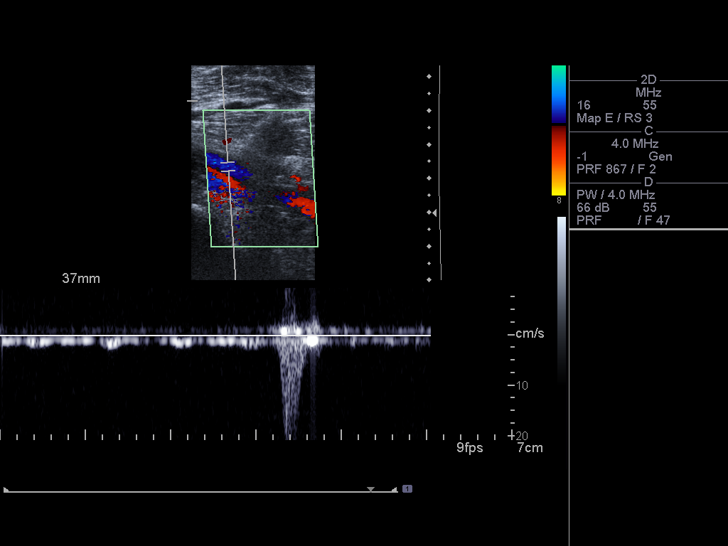
[im 20/35]
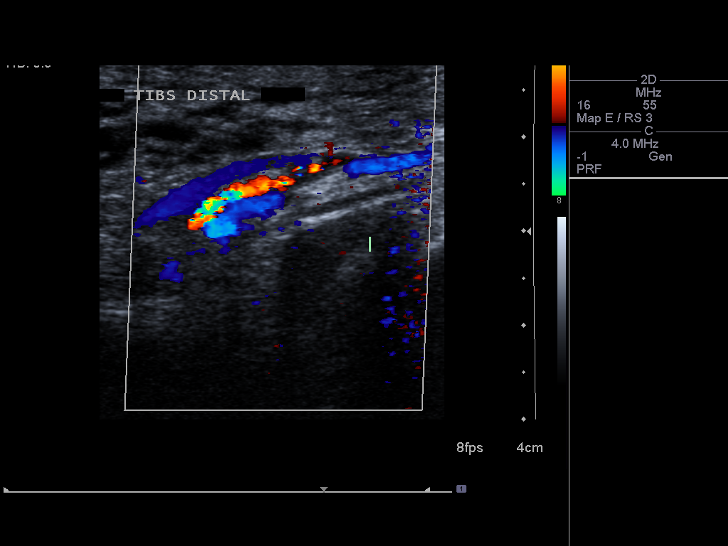
[im 23/35]
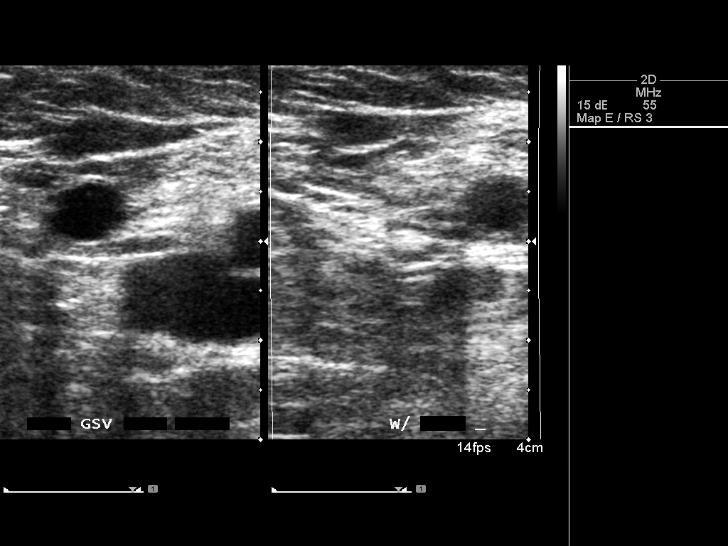
[im 26/35]
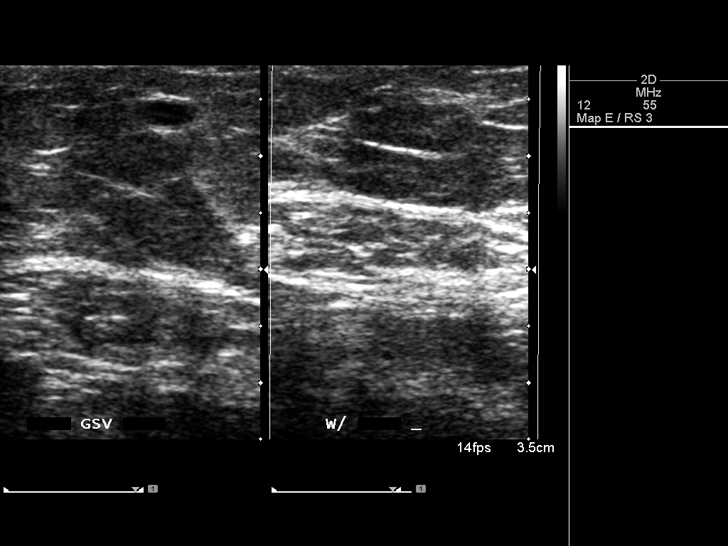
[im 29/35]
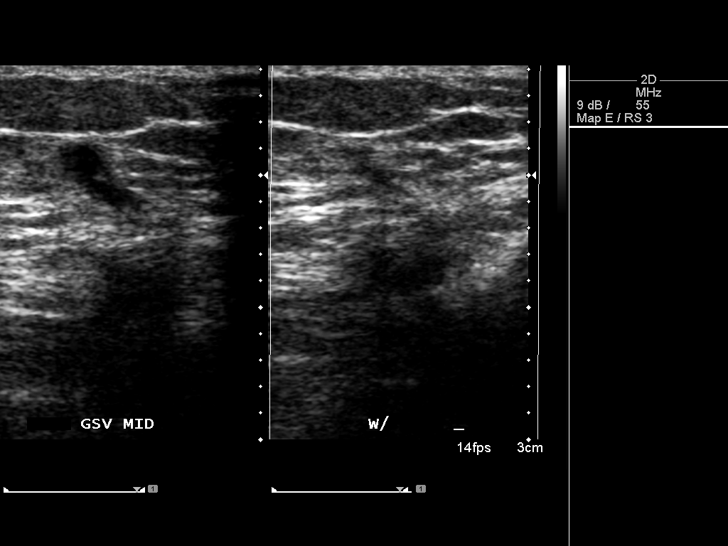
[im 32/35]
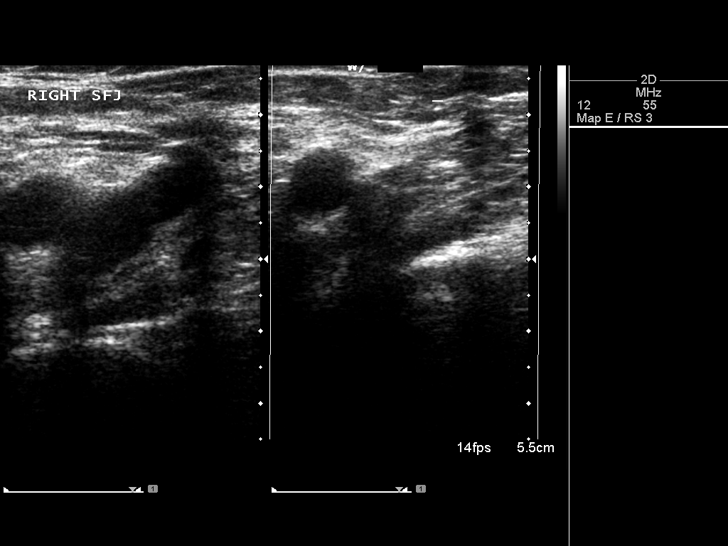
[im 35/35]
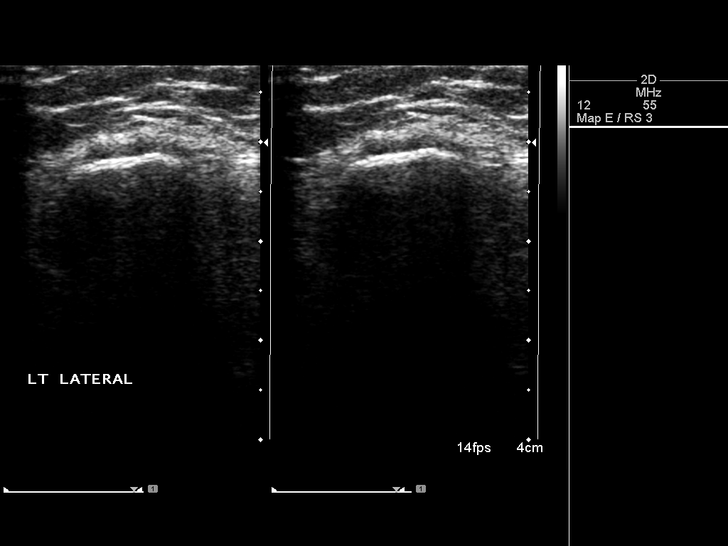

[13 of 24 positions shown; findings below may reference images not displayed]

FINDINGS: Contralateral Common Femoral Vein: Respiratory phasicity is normal
and symmetric with the symptomatic side. No evidence of thrombus.
Normal compressibility.

Common Femoral Vein: No evidence of thrombus. Normal
compressibility, respiratory phasicity and response to augmentation.

Saphenofemoral Junction: No evidence of thrombus. Normal
compressibility and flow on color Doppler imaging.

Profunda Femoral Vein: No evidence of thrombus. Normal
compressibility and flow on color Doppler imaging.

Femoral Vein: No evidence of thrombus. Normal compressibility,
respiratory phasicity and response to augmentation.

Popliteal Vein: No evidence of thrombus. Normal compressibility,
respiratory phasicity and response to augmentation.

Calf Veins: No evidence of thrombus. Normal compressibility and flow
on color Doppler imaging.

Superficial Great Saphenous Vein: No evidence of thrombus. Normal
compressibility and flow on color Doppler imaging.

Venous Reflux:  None.

Other Findings: A minimal amount of subcutaneous edema is noted at
the level of the left calf and lower leg.
IMPRESSION: No evidence of DVT within the left lower extremity.

## 2016-07-19 ENCOUNTER — Other Ambulatory Visit: Payer: Self-pay | Admitting: Oncology

## 2016-07-19 ENCOUNTER — Encounter: Payer: Self-pay | Admitting: Pharmacist

## 2016-07-19 NOTE — Progress Notes (Signed)
In follow up and preparation for pt to come Fri 10/6 for Opdivo, I contacted [employer] access support (ph# [phone]) to check on the status of pts application for assistance. They needed information regarding adjuvant treatment and I informed them this is for adjuvant treatment. I gave them permission to refer the patient over to the [employer] Patient Assistance program since their benefits analysis shows [employer] "will not cover as there is a denial on file for [phone], diagnosis code C43.9.  Coverage is based on medical necessity and this pts dx does not meet medical necessity based on FDA guidelines." It will take a few hours to submit the case (Reference # [id number]) to patient assistance and they asked me to follow up tomorrow by calling [phone].  Kennith Center, Pharm.D., CPP 07/19/2016@4 :34 PM

## 2016-07-20 ENCOUNTER — Encounter: Payer: Self-pay | Admitting: Pharmacist

## 2016-07-21 ENCOUNTER — Ambulatory Visit (HOSPITAL_BASED_OUTPATIENT_CLINIC_OR_DEPARTMENT_OTHER): Payer: BLUE CROSS/BLUE SHIELD | Admitting: Oncology

## 2016-07-21 ENCOUNTER — Telehealth: Payer: Self-pay | Admitting: *Deleted

## 2016-07-21 ENCOUNTER — Ambulatory Visit: Payer: BLUE CROSS/BLUE SHIELD

## 2016-07-21 ENCOUNTER — Telehealth: Payer: Self-pay | Admitting: Oncology

## 2016-07-21 ENCOUNTER — Other Ambulatory Visit (HOSPITAL_BASED_OUTPATIENT_CLINIC_OR_DEPARTMENT_OTHER): Payer: BLUE CROSS/BLUE SHIELD

## 2016-07-21 VITALS — BP 107/70 | HR 62 | Temp 98.0°F | Resp 20 | Ht 65.0 in | Wt 161.3 lb

## 2016-07-21 DIAGNOSIS — C4372 Malignant melanoma of left lower limb, including hip: Secondary | ICD-10-CM

## 2016-07-21 DIAGNOSIS — C439 Malignant melanoma of skin, unspecified: Secondary | ICD-10-CM

## 2016-07-21 DIAGNOSIS — C4361 Malignant melanoma of right upper limb, including shoulder: Secondary | ICD-10-CM | POA: Diagnosis not present

## 2016-07-21 LAB — COMPREHENSIVE METABOLIC PANEL
ALT: 13 U/L (ref 0–55)
AST: 13 U/L (ref 5–34)
Albumin: 3.8 g/dL (ref 3.5–5.0)
Alkaline Phosphatase: 67 U/L (ref 40–150)
Anion Gap: 9 mEq/L (ref 3–11)
BUN: 9.4 mg/dL (ref 7.0–26.0)
CO2: 23 mEq/L (ref 22–29)
Calcium: 9.3 mg/dL (ref 8.4–10.4)
Chloride: 110 mEq/L — ABNORMAL HIGH (ref 98–109)
Creatinine: 0.7 mg/dL (ref 0.6–1.1)
EGFR: >90 mL/min/{1.73_m2} (ref >90)
Glucose: 96 mg/dl (ref 70–140)
Potassium: 4 mEq/L (ref 3.5–5.1)
Sodium: 142 mEq/L (ref 136–145)
Total Bilirubin: 0.36 mg/dL (ref 0.20–1.20)
Total Protein: 6.5 g/dL (ref 6.4–8.3)

## 2016-07-21 LAB — CBC WITH DIFFERENTIAL/PLATELET
BASO%: 1 % (ref 0.0–2.0)
Basophils Absolute: 0 10*3/uL (ref 0.0–0.1)
EOS%: 1.6 % (ref 0.0–7.0)
Eosinophils Absolute: 0.1 10*3/uL (ref 0.0–0.5)
HCT: 42.5 % (ref 34.8–46.6)
HGB: 14 g/dL (ref 11.6–15.9)
LYMPH%: 32 % (ref 14.0–49.7)
MCH: 28.1 pg (ref 25.1–34.0)
MCHC: 33.1 g/dL (ref 31.5–36.0)
MCV: 85 fL (ref 79.5–101.0)
MONO#: 0.3 10*3/uL (ref 0.1–0.9)
MONO%: 7.5 % (ref 0.0–14.0)
NEUT#: 2.3 10*3/uL (ref 1.5–6.5)
NEUT%: 57.9 % (ref 38.4–76.8)
Platelets: 222 10*3/uL (ref 145–400)
RBC: 5 10*6/uL (ref 3.70–5.45)
RDW: 14 % (ref 11.2–14.5)
WBC: 4 10*3/uL (ref 3.9–10.3)
lymph#: 1.3 10*3/uL (ref 0.9–3.3)

## 2016-07-21 NOTE — Telephone Encounter (Signed)
Call from pt asking for clarification on why she couldn't get "free drug" during visit today. Informed her that pharmacy is awaiting approval from the drug company for replacement drug. The clinic will be billed for drug if replacement is denied. Pt voiced understanding.

## 2016-07-21 NOTE — Telephone Encounter (Signed)
Pt states she was sent home today and told insurance has not approved her treatment yet.  She says Dr. Alen Blew told her treatment would be "free" so she asks how it could be denied if it is supposed to be free?  Informed her will have someone call her back with the answer.  S/w Charge RN, Tanya, in Infusion room. She will call pt back to explain.

## 2016-07-21 NOTE — Progress Notes (Signed)
Hematology and Oncology Follow Up Visit  KADIJATOU NEIGHBOURS [id number] 1961/07/14 55 y.o. 07/21/2016 8:56 AM [name], MDClark, Don Broach, MD   Principle Diagnosis: 55 year old woman with the following issues:  1. Superficial spreading melanoma of the lower extremity diagnosed in August 2017. She was found to have 1.25 mm thickness without ulceration or lymphovascular invasion. She had 1 out of 2 lymph nodes positive. The size of the lymph node measuring 0.25 x 0.95 mm with pathological staging T2bN1.   2. 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 These findings indicate a pathological staging of T1 NA.   Prior Therapy: She is status post local excision followed by sentinel lymph node sampling with 2 lymph nodes sampled one was involved with metastatic melanoma. The measurement was 0.25 x 0.95 mm in dimension of the capsule. Her final pathological stage stage is T2bN1. This was completed on 05/23/2016.   Current therapy:  Under evaluation for adjuvant immunotherapy. The preference is to receive Nivolumab total of 240 mg dose every 2 weeks for total of 4 doses. Tentative start on 07/21/2016.  Interim History: [name] presents today for a follow-up visit. Since the last visit, she reports no major changes in her health. Her wound is healing properly and a wound VAC has been removed. She has developed left lower extremity swelling which is improving at this time. She did have lower extremity Dopplers that ruled out DVT. She remains active and perform activities of daily living. She does not report any constitutional symptoms or changes in her mental status.  She does not report any headaches, blurry vision, syncope or seizures. She does not report any fevers, chills, sweats or weight loss. She does not report any chest pain, palpitation orthopnea or leg edema. She does not report any cough,  wheezing or hemoptysis. She is not abort any nausea, vomiting or abdominal pain. She does not report any frequency urgency or hesitancy. She does not report any skeletal complaints. Remaining review of systems unremarkable.    Medications: I have reviewed the patient's current medications.  Current Outpatient Prescriptions  Medication Sig Dispense Refill  . calcium carbonate (OS-CAL) 600 MG TABS Take 600 mg by mouth 2 (two) times daily with a meal.      . cholecalciferol (VITAMIN D) 1000 UNITS tablet Take 5,000 Units by mouth daily.     . Cyanocobalamin (VITAMIN B-12 CR PO) Take 1 tablet by mouth daily.    Marland Kitchen estradiol (VIVELLE-DOT) 0.0375 MG/24HR Place 1 patch onto the skin 2 (two) times a week. 24 patch 4  . levothyroxine (SYNTHROID, LEVOTHROID) 50 MCG tablet Take 50 mcg by mouth daily before breakfast.    . MAGNESIUM PO Take 1 tablet by mouth every other day.    . Omega-3 Fatty Acids (FISH OIL) 1200 MG CAPS Take 2 capsules by mouth daily.     Marland Kitchen OVER THE COUNTER MEDICATION Multiple vitamin qd     . ondansetron (ZOFRAN) 4 MG tablet Take 1 tablet (4 mg total) by mouth every 8 (eight) hours as needed for nausea. (Patient not taking: Reported on 07/21/2016) 20 tablet 0   No current facility-administered medications for this visit.      Allergies:  Allergies  Allergen Reactions  . Codeine Nausea Only and Other (See Comments)    Hallucinations; confusion    Past Medical History, Surgical history, Social history, and Family History were reviewed and  updated.   Physical Exam: Blood pressure 107/70, pulse 62, temperature 98 F (36.7 C), temperature source Oral, resp. rate 20, height 5\' 5"  (1.651 m), weight 161 lb 4.8 oz (73.2 kg), last menstrual period 06/30/1990, SpO2 99 %. ECOG: 0 General appearance: alert and cooperative appeared without distress. Head: Normocephalic, without obvious abnormality no oral ulcers or lesions. Neck: no adenopathy Lymph nodes: Cervical, supraclavicular, and  axillary nodes normal. Heart:regular rate and rhythm, S1, S2 normal, no murmur, click, rub or gallop Lung:chest clear, no wheezing, rales, normal symmetric air entry Abdomin: soft, non-tender, without masses or organomegaly EXT:no erythema, induration, or nodules. Wound healing nicely on the left leg. Mild edema noted.   Lab Results: Lab Results  Component Value Date   WBC 4.0 07/21/2016   HGB 14.0 07/21/2016   HCT 42.5 07/21/2016   MCV 85.0 07/21/2016   PLT 222 07/21/2016     Chemistry      Component Value Date/Time   NA 143 06/30/2016 0826   K 4.3 06/30/2016 0826   CL 104 05/22/2016 0900   CO2 23 06/30/2016 0826   BUN 12.2 06/30/2016 0826   CREATININE 0.7 06/30/2016 0826      Component Value Date/Time   CALCIUM 9.1 06/30/2016 0826   ALKPHOS 69 06/30/2016 0826   AST 14 06/30/2016 0826   ALT 21 06/30/2016 0826   BILITOT <0.30 06/30/2016 0826        Impression and Plan:  55 year old woman with the following issues:  1. Superficial spreading melanoma of the left lower extremity diagnosed in August 2017. She was found to have a 1.25 mm thickness with anatomical level III and no ulceration or lymphovascular invasion. She is status post local excision followed by sentinel lymph node sampling with 2 lymph nodes sampled one was involved with metastatic melanoma. The measurement was 0.25 x 0.95 mm in dimension of the capsule. Her final pathological stage stage is T2bN1.  Her PET/CT scanon 06/28/2016 was reviewed again and showed no evidence of metastatic disease. She is under consideration to start Nivolumab based on the data from [place] 238 study that compared Nivolumab to Ipilimumab revealed significant progression of his arrival in favor of the former.  These results published online: [place] [employer] : September 10, 2017DOI: 10.1056/[id number]  We are awaiting assistant program and product replacement to obtain this medication. Risks and benefits were  reviewed with the patient and she is agreeable to proceed once the product is available. She'll require 4 doses every 2 weeks.  If she does not receive her first treatment today and will be rescheduled for 2 weeks from now.   2. Superficial spreading melanoma of the right shoulder diagnosed in August 2017. She is status post wide local excision and found to have a 0.72 mm. These findings indicate a pathological staging of T1 NA.  Her physical examination does not reveal any evidence of recurrent disease. I have recommended continued follow-up with dermatology.  3. Immune function surveillance: That will include thyroid function surveillance. I continue to counsel her about pneumonitis, dermatitis among other associated with this medication.  4. Follow-up: Will be in 2 weeks for her next dose of therapy.   [id number], MD 10/6/20178:56 AM

## 2016-07-21 NOTE — Telephone Encounter (Signed)
Avs report and appointment schedule given to patient per 07/21/16 los. °

## 2016-07-25 ENCOUNTER — Telehealth: Payer: Self-pay | Admitting: *Deleted

## 2016-07-25 NOTE — Progress Notes (Signed)
Lm on patient's answering machine, that opdivo was here, per carol in pharmacy

## 2016-07-25 NOTE — Telephone Encounter (Signed)
"  I need the extension for administration.  I come in and sent home.  I take time off work for n othing.  I've been approved for free medication, scheduled to come in and sent home being told the drug is not available.  You all have the drug on hand."   This nurse consulted [place] patient's mobile number explaining approval received last Friday for a total of $100,000 of medication.  We're awaiting arrival of medication today from the company.  Asked she call back tomorrow to ensure receipt because this refrigerated medication is what she will receive 07-28-2016.  Apologized for any inconvenience due to our communication with [employer].

## 2016-07-27 DIAGNOSIS — E890 Postprocedural hypothyroidism: Secondary | ICD-10-CM | POA: Diagnosis not present

## 2016-07-27 DIAGNOSIS — E039 Hypothyroidism, unspecified: Secondary | ICD-10-CM | POA: Diagnosis not present

## 2016-07-28 ENCOUNTER — Ambulatory Visit (HOSPITAL_BASED_OUTPATIENT_CLINIC_OR_DEPARTMENT_OTHER): Payer: BLUE CROSS/BLUE SHIELD

## 2016-07-28 VITALS — BP 113/77 | HR 76 | Temp 97.9°F | Resp 18

## 2016-07-28 DIAGNOSIS — C4361 Malignant melanoma of right upper limb, including shoulder: Secondary | ICD-10-CM | POA: Diagnosis not present

## 2016-07-28 DIAGNOSIS — Z5112 Encounter for antineoplastic immunotherapy: Secondary | ICD-10-CM | POA: Diagnosis not present

## 2016-07-28 DIAGNOSIS — C4372 Malignant melanoma of left lower limb, including hip: Secondary | ICD-10-CM | POA: Diagnosis not present

## 2016-07-28 DIAGNOSIS — C439 Malignant melanoma of skin, unspecified: Secondary | ICD-10-CM

## 2016-07-28 MED ORDER — SODIUM CHLORIDE 0.9 % IV SOLN
Freq: Once | INTRAVENOUS | Status: AC
  Administered 2016-07-28: 09:00:00 via INTRAVENOUS

## 2016-07-28 MED ORDER — SODIUM CHLORIDE 0.9 % IV SOLN
240.0000 mg | Freq: Once | INTRAVENOUS | Status: AC
  Administered 2016-07-28: 240 mg via INTRAVENOUS
  Filled 2016-07-28: qty 20

## 2016-07-28 NOTE — Patient Instructions (Signed)
[facility] Discharge Instructions for Patients Receiving Chemotherapy  Today you received the following chemotherapy agents: Optivo  To help prevent nausea and vomiting after your treatment, we encourage you to take your nausea medication as directed.  If you develop nausea and vomiting that is not controlled by your nausea medication, call the clinic.   BELOW ARE SYMPTOMS THAT SHOULD BE REPORTED IMMEDIATELY:  *FEVER GREATER THAN 100.5 F  *CHILLS WITH OR WITHOUT FEVER  NAUSEA AND VOMITING THAT IS NOT CONTROLLED WITH YOUR NAUSEA MEDICATION  *UNUSUAL SHORTNESS OF BREATH  *UNUSUAL BRUISING OR BLEEDING  TENDERNESS IN MOUTH AND THROAT WITH OR WITHOUT PRESENCE OF ULCERS  *URINARY PROBLEMS  *BOWEL PROBLEMS  UNUSUAL RASH Items with * indicate a potential emergency and should be followed up as soon as possible.  Feel free to call the clinic you have any questions or concerns. The clinic phone number is [phone].  Please show the CHEMO ALERT CARD at check-in to the Emergency Department and triage nurse.   

## 2016-08-02 ENCOUNTER — Encounter: Payer: Self-pay | Admitting: Oncology

## 2016-08-02 NOTE — Progress Notes (Signed)
Sharon Kirk in coding inquired about assistance dates for DR. Called [employer] to request approval letter via fax for Obdivo. Fax received. Emailed to [employer] and drug replacement communication team the approval dates.

## 2016-08-04 ENCOUNTER — Other Ambulatory Visit: Payer: BLUE CROSS/BLUE SHIELD

## 2016-08-10 ENCOUNTER — Ambulatory Visit
Admission: RE | Admit: 2016-08-10 | Discharge: 2016-08-10 | Disposition: A | Discharge status: Home / Self Care | Payer: BLUE CROSS/BLUE SHIELD | Source: Ambulatory Visit | Attending: Women's Health | Admitting: Women's Health

## 2016-08-10 ENCOUNTER — Other Ambulatory Visit: Payer: Self-pay | Admitting: *Deleted

## 2016-08-10 DIAGNOSIS — Z1231 Encounter for screening mammogram for malignant neoplasm of breast: Secondary | ICD-10-CM | POA: Diagnosis not present

## 2016-08-11 ENCOUNTER — Ambulatory Visit (HOSPITAL_BASED_OUTPATIENT_CLINIC_OR_DEPARTMENT_OTHER): Payer: BLUE CROSS/BLUE SHIELD | Admitting: Oncology

## 2016-08-11 ENCOUNTER — Encounter: Payer: Self-pay | Admitting: Women's Health

## 2016-08-11 ENCOUNTER — Other Ambulatory Visit (HOSPITAL_BASED_OUTPATIENT_CLINIC_OR_DEPARTMENT_OTHER): Payer: BLUE CROSS/BLUE SHIELD

## 2016-08-11 ENCOUNTER — Ambulatory Visit (HOSPITAL_BASED_OUTPATIENT_CLINIC_OR_DEPARTMENT_OTHER): Payer: BLUE CROSS/BLUE SHIELD

## 2016-08-11 VITALS — BP 106/65 | HR 64 | Temp 97.7°F | Resp 18 | Ht 65.0 in | Wt 163.0 lb

## 2016-08-11 DIAGNOSIS — C4361 Malignant melanoma of right upper limb, including shoulder: Secondary | ICD-10-CM

## 2016-08-11 DIAGNOSIS — C4372 Malignant melanoma of left lower limb, including hip: Secondary | ICD-10-CM

## 2016-08-11 DIAGNOSIS — C439 Malignant melanoma of skin, unspecified: Secondary | ICD-10-CM

## 2016-08-11 DIAGNOSIS — Z5112 Encounter for antineoplastic immunotherapy: Secondary | ICD-10-CM | POA: Diagnosis not present

## 2016-08-11 LAB — CBC WITH DIFFERENTIAL/PLATELET
BASO%: 0.7 % (ref 0.0–2.0)
Basophils Absolute: 0 10*3/uL (ref 0.0–0.1)
EOS%: 1.8 % (ref 0.0–7.0)
Eosinophils Absolute: 0.1 10*3/uL (ref 0.0–0.5)
HCT: 42.8 % (ref 34.8–46.6)
HGB: 14.1 g/dL (ref 11.6–15.9)
LYMPH%: 29.6 % (ref 14.0–49.7)
MCH: 28.2 pg (ref 25.1–34.0)
MCHC: 32.9 g/dL (ref 31.5–36.0)
MCV: 85.5 fL (ref 79.5–101.0)
MONO#: 0.4 10*3/uL (ref 0.1–0.9)
MONO%: 8.8 % (ref 0.0–14.0)
NEUT#: 2.8 10*3/uL (ref 1.5–6.5)
NEUT%: 59.1 % (ref 38.4–76.8)
Platelets: 243 10*3/uL (ref 145–400)
RBC: 5 10*6/uL (ref 3.70–5.45)
RDW: 13.9 % (ref 11.2–14.5)
WBC: 4.7 10*3/uL (ref 3.9–10.3)
lymph#: 1.4 10*3/uL (ref 0.9–3.3)

## 2016-08-11 LAB — COMPREHENSIVE METABOLIC PANEL
ALT: 19 U/L (ref 0–55)
AST: 15 U/L (ref 5–34)
Albumin: 3.8 g/dL (ref 3.5–5.0)
Alkaline Phosphatase: 74 U/L (ref 40–150)
Anion Gap: 8 mEq/L (ref 3–11)
BUN: 13.2 mg/dL (ref 7.0–26.0)
CO2: 28 mEq/L (ref 22–29)
Calcium: 9.8 mg/dL (ref 8.4–10.4)
Chloride: 107 mEq/L (ref 98–109)
Creatinine: 0.7 mg/dL (ref 0.6–1.1)
EGFR: >90 mL/min/{1.73_m2} (ref >90)
Glucose: 83 mg/dl (ref 70–140)
Potassium: 4.5 mEq/L (ref 3.5–5.1)
Sodium: 144 mEq/L (ref 136–145)
Total Bilirubin: 0.42 mg/dL (ref 0.20–1.20)
Total Protein: 7 g/dL (ref 6.4–8.3)

## 2016-08-11 MED ORDER — SODIUM CHLORIDE 0.9 % IV SOLN
Freq: Once | INTRAVENOUS | Status: AC
  Administered 2016-08-11: 11:00:00 via INTRAVENOUS

## 2016-08-11 MED ORDER — SODIUM CHLORIDE 0.9 % IV SOLN
240.0000 mg | Freq: Once | INTRAVENOUS | Status: AC
  Administered 2016-08-11: 240 mg via INTRAVENOUS
  Filled 2016-08-11: qty 4

## 2016-08-11 NOTE — Patient Instructions (Signed)
[facility] Discharge Instructions for Patients Receiving Chemotherapy  Today you received the following chemotherapy agents: Optivo  To help prevent nausea and vomiting after your treatment, we encourage you to take your nausea medication as directed.  If you develop nausea and vomiting that is not controlled by your nausea medication, call the clinic.   BELOW ARE SYMPTOMS THAT SHOULD BE REPORTED IMMEDIATELY:  *FEVER GREATER THAN 100.5 F  *CHILLS WITH OR WITHOUT FEVER  NAUSEA AND VOMITING THAT IS NOT CONTROLLED WITH YOUR NAUSEA MEDICATION  *UNUSUAL SHORTNESS OF BREATH  *UNUSUAL BRUISING OR BLEEDING  TENDERNESS IN MOUTH AND THROAT WITH OR WITHOUT PRESENCE OF ULCERS  *URINARY PROBLEMS  *BOWEL PROBLEMS  UNUSUAL RASH Items with * indicate a potential emergency and should be followed up as soon as possible.  Feel free to call the clinic you have any questions or concerns. The clinic phone number is [phone].  Please show the CHEMO ALERT CARD at check-in to the Emergency Department and triage nurse.   

## 2016-08-11 NOTE — Progress Notes (Signed)
Hematology and Oncology Follow Up Visit  Sharon Kirk [id number] [birth year] 55 y.o. 08/11/2016 9:27 AM [name], MDClark, Don Broach, MD   Principle Diagnosis: 55 year old woman with the following issues:  1. Superficial spreading melanoma of the lower extremity diagnosed in August 2017. She was found to have 1.25 mm thickness without ulceration or lymphovascular invasion. She had 1 out of 2 lymph nodes positive. The size of the lymph node measuring 0.25 x 0.95 mm with pathological staging T2bN1.   2. 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 These findings indicate a pathological staging of T1 NA.   Prior Therapy: She is status post local excision followed by sentinel lymph node sampling with 2 lymph nodes sampled one was involved with metastatic melanoma. The measurement was 0.25 x 0.95 mm in dimension of the capsule. Her final pathological stage stage is T2bN1. This was completed on 05/23/2016.   Current therapy:  Nivolumab total of 240 mg dose every 2 weeks for total of 4 doses. Started on 07/21/2016. She is here for cycle 2 of therapy.  Interim History: Mrs. Sharon Kirk presents today for a follow-up visit. Since the last visit, she received the first dose of Nivolumab and tolerated it without any major complications. She did report mild headaches on the day of the infusion and the following day and have resolved spontaneously. She remains active and perform activities of daily living. She does not report any constitutional symptoms or changes in her mental status. She denied any falls or syncope. She denied any shortness of breath, cough or wheezing.  She does not report any headaches, blurry vision, syncope or seizures. She does not report any fevers, chills, sweats or weight loss. She does not report any chest pain, palpitation orthopnea or leg edema. She does not report any cough,  wheezing or hemoptysis. She is not abort any nausea, vomiting or abdominal pain. She does not report any frequency urgency or hesitancy. She does not report any skeletal complaints. Remaining review of systems unremarkable.    Medications: I have reviewed the patient's current medications.  Current Outpatient Prescriptions  Medication Sig Dispense Refill  . calcium carbonate (OS-CAL) 600 MG TABS Take 600 mg by mouth 2 (two) times daily with a meal.      . cholecalciferol (VITAMIN D) 1000 UNITS tablet Take 5,000 Units by mouth daily.     . Cyanocobalamin (VITAMIN B-12 CR PO) Take 1 tablet by mouth daily.    Marland Kitchen estradiol (VIVELLE-DOT) 0.0375 MG/24HR Place 1 patch onto the skin 2 (two) times a week. 24 patch 4  . levothyroxine (SYNTHROID, LEVOTHROID) 50 MCG tablet Take 50 mcg by mouth daily before breakfast.    . MAGNESIUM PO Take 1 tablet by mouth every other day.    . Omega-3 Fatty Acids (FISH OIL) 1200 MG CAPS Take 2 capsules by mouth daily.     . ondansetron (ZOFRAN) 4 MG tablet Take 1 tablet (4 mg total) by mouth every 8 (eight) hours as needed for nausea. 20 tablet 0  . OVER THE COUNTER MEDICATION Multiple vitamin qd      No current facility-administered medications for this visit.      Allergies:  Allergies  Allergen Reactions  . Codeine Nausea Only and Other (See Comments)    Hallucinations; confusion    Past Medical History, Surgical history, Social history, and Family History were reviewed and updated.   Physical  Exam: Blood pressure 106/65, pulse 64, temperature 97.7 F (36.5 C), temperature source Oral, resp. rate 18, height 5\' 5"  (1.651 m), weight 163 lb (73.9 kg), last menstrual period 06/30/1990, SpO2 100 %. ECOG: 0 General appearance: Alert, awake: Without distress. Head: Normocephalic, without obvious abnormality no oral ulcers or lesions. Neck: no adenopathy Lymph nodes: Cervical, supraclavicular, and axillary nodes normal. Heart:regular rate and rhythm, S1, S2  normal, no murmur, click, rub or gallop Lung:chest clear, no wheezing, rales, normal symmetric air entry Abdomin: soft, non-tender, without masses or organomegaly EXT:. Mild edema noted on her left lower extremity. Wound well healed.   Lab Results: Lab Results  Component Value Date   WBC 4.7 08/11/2016   HGB 14.1 08/11/2016   HCT 42.8 08/11/2016   MCV 85.5 08/11/2016   PLT 243 08/11/2016     Chemistry      Component Value Date/Time   NA 144 08/11/2016 0828   K 4.5 08/11/2016 0828   CL 104 05/22/2016 0900   CO2 28 08/11/2016 0828   BUN 13.2 08/11/2016 0828   CREATININE 0.7 08/11/2016 0828      Component Value Date/Time   CALCIUM 9.8 08/11/2016 0828   ALKPHOS 74 08/11/2016 0828   AST 15 08/11/2016 0828   ALT 19 08/11/2016 0828   BILITOT 0.42 08/11/2016 0828        Impression and Plan:  55 year old woman with the following issues:  1. Superficial spreading melanoma of the left lower extremity diagnosed in August 2017. She was found to have a 1.25 mm thickness with anatomical level III and no ulceration or lymphovascular invasion. She is status post local excision followed by sentinel lymph node sampling with 2 lymph nodes sampled one was involved with metastatic melanoma. The measurement was 0.25 x 0.95 mm in dimension of the capsule. Her final pathological stage stage is T2bN1.  Her PET/CT scanon 06/28/2016 was reviewed again and showed no evidence of metastatic disease.   She is receiving Nivolumab and have tolerated it well. The plan is to proceed with cycle 2 of therapy of planned 4 cycles.   This is based on the data from [place] 238 study that compared Nivolumab to Ipilimumab revealed significant progression of his arrival in favor of the former.These results published online: [place] [facility] : September 10, 2017DOI: 10.1056/[id number]   2. Superficial spreading melanoma of the right shoulder diagnosed in August 2017. She is status post wide  local excision and found to have a 0.72 mm. These findings indicate a pathological staging of T1 NA.  Her physical examination does not reveal any evidence of recurrent disease. I have recommended continued follow-up with dermatology.  3. Immune function surveillance: That will include thyroid function surveillance. I continue to counsel her about pneumonitis, dermatitis among other associated with this medication.  4. Follow-up: Will be in 2 weeks for her next dose of therapy.   [id number], MD 10/27/20179:27 AM

## 2016-08-18 ENCOUNTER — Ambulatory Visit: Payer: BLUE CROSS/BLUE SHIELD | Admitting: Oncology

## 2016-08-18 ENCOUNTER — Other Ambulatory Visit: Payer: BLUE CROSS/BLUE SHIELD

## 2016-08-24 ENCOUNTER — Encounter: Payer: BLUE CROSS/BLUE SHIELD | Admitting: Women's Health

## 2016-08-25 ENCOUNTER — Ambulatory Visit (HOSPITAL_BASED_OUTPATIENT_CLINIC_OR_DEPARTMENT_OTHER): Payer: BLUE CROSS/BLUE SHIELD

## 2016-08-25 ENCOUNTER — Other Ambulatory Visit (HOSPITAL_BASED_OUTPATIENT_CLINIC_OR_DEPARTMENT_OTHER): Payer: BLUE CROSS/BLUE SHIELD

## 2016-08-25 VITALS — BP 103/64 | HR 61 | Temp 97.7°F | Resp 17

## 2016-08-25 DIAGNOSIS — E032 Hypothyroidism due to medicaments and other exogenous substances: Secondary | ICD-10-CM

## 2016-08-25 DIAGNOSIS — C439 Malignant melanoma of skin, unspecified: Secondary | ICD-10-CM

## 2016-08-25 DIAGNOSIS — C4361 Malignant melanoma of right upper limb, including shoulder: Secondary | ICD-10-CM

## 2016-08-25 DIAGNOSIS — Z5112 Encounter for antineoplastic immunotherapy: Secondary | ICD-10-CM

## 2016-08-25 DIAGNOSIS — C4372 Malignant melanoma of left lower limb, including hip: Secondary | ICD-10-CM

## 2016-08-25 DIAGNOSIS — Z79899 Other long term (current) drug therapy: Secondary | ICD-10-CM | POA: Diagnosis not present

## 2016-08-25 LAB — TSH: TSH: 0.57 m(IU)/L (ref 0.308–3.960)

## 2016-08-25 LAB — CBC WITH DIFFERENTIAL/PLATELET
BASO%: 0.8 % (ref 0.0–2.0)
Basophils Absolute: 0 10*3/uL (ref 0.0–0.1)
EOS%: 1.5 % (ref 0.0–7.0)
Eosinophils Absolute: 0.1 10*3/uL (ref 0.0–0.5)
HCT: 41.2 % (ref 34.8–46.6)
HGB: 13.8 g/dL (ref 11.6–15.9)
LYMPH%: 27.9 % (ref 14.0–49.7)
MCH: 28.1 pg (ref 25.1–34.0)
MCHC: 33.5 g/dL (ref 31.5–36.0)
MCV: 83.9 fL (ref 79.5–101.0)
MONO#: 0.4 10*3/uL (ref 0.1–0.9)
MONO%: 8.7 % (ref 0.0–14.0)
NEUT#: 2.6 10*3/uL (ref 1.5–6.5)
NEUT%: 61.1 % (ref 38.4–76.8)
Platelets: 221 10*3/uL (ref 145–400)
RBC: 4.91 10*6/uL (ref 3.70–5.45)
RDW: 13.6 % (ref 11.2–14.5)
WBC: 4.2 10*3/uL (ref 3.9–10.3)
lymph#: 1.2 10*3/uL (ref 0.9–3.3)

## 2016-08-25 LAB — COMPREHENSIVE METABOLIC PANEL
ALT: 15 U/L (ref 0–55)
AST: 14 U/L (ref 5–34)
Albumin: 3.7 g/dL (ref 3.5–5.0)
Alkaline Phosphatase: 72 U/L (ref 40–150)
Anion Gap: 10 mEq/L (ref 3–11)
BUN: 14.1 mg/dL (ref 7.0–26.0)
CO2: 21 mEq/L — ABNORMAL LOW (ref 22–29)
Calcium: 9.3 mg/dL (ref 8.4–10.4)
Chloride: 107 mEq/L (ref 98–109)
Creatinine: 0.6 mg/dL (ref 0.6–1.1)
EGFR: >90 mL/min/{1.73_m2} (ref >90)
Glucose: 100 mg/dl (ref 70–140)
Potassium: 4.2 mEq/L (ref 3.5–5.1)
Sodium: 138 mEq/L (ref 136–145)
Total Bilirubin: 0.39 mg/dL (ref 0.20–1.20)
Total Protein: 6.8 g/dL (ref 6.4–8.3)

## 2016-08-25 MED ORDER — NIVOLUMAB CHEMO INJECTION 100 MG/10ML
240.0000 mg | Freq: Once | INTRAVENOUS | Status: AC
  Administered 2016-08-25: 240 mg via INTRAVENOUS
  Filled 2016-08-25: qty 20

## 2016-08-25 MED ORDER — SODIUM CHLORIDE 0.9 % IV SOLN
Freq: Once | INTRAVENOUS | Status: AC
  Administered 2016-08-25: 09:00:00 via INTRAVENOUS

## 2016-08-25 NOTE — Patient Instructions (Signed)
[facility] Discharge Instructions for Patients Receiving Chemotherapy  Today you received the following chemotherapy agents Opdivo.  To help prevent nausea and vomiting after your treatment, we encourage you to take your nausea medication as directed.   If you develop nausea and vomiting that is not controlled by your nausea medication, call the clinic.   BELOW ARE SYMPTOMS THAT SHOULD BE REPORTED IMMEDIATELY:  *FEVER GREATER THAN 100.5 F  *CHILLS WITH OR WITHOUT FEVER  NAUSEA AND VOMITING THAT IS NOT CONTROLLED WITH YOUR NAUSEA MEDICATION  *UNUSUAL SHORTNESS OF BREATH  *UNUSUAL BRUISING OR BLEEDING  TENDERNESS IN MOUTH AND THROAT WITH OR WITHOUT PRESENCE OF ULCERS  *URINARY PROBLEMS  *BOWEL PROBLEMS  UNUSUAL RASH Items with * indicate a potential emergency and should be followed up as soon as possible.  Feel free to call the clinic you have any questions or concerns. The clinic phone number is [phone].  Please show the CHEMO ALERT CARD at check-in to the Emergency Department and triage nurse.    

## 2016-08-25 NOTE — Progress Notes (Signed)
Residual bruising at PIV site L FA post infusion. Infiltration concern. Had Lanelle Bal, RN visualize and believes same as pre-infusion. Pt complaint of soreness, but no burning or pain during nivolumab infusion. Gave pt hot packs to help reabsorb bruised area. Reinforced importance to call if site worsens, other than typical bruising. Pt verbalized understanding.  Asymptomatic upon d/c. AVS printed.

## 2016-08-29 ENCOUNTER — Ambulatory Visit (INDEPENDENT_AMBULATORY_CARE_PROVIDER_SITE_OTHER): Payer: BLUE CROSS/BLUE SHIELD | Admitting: Women's Health

## 2016-08-29 ENCOUNTER — Encounter: Payer: Self-pay | Admitting: Women's Health

## 2016-08-29 VITALS — BP 112/70 | Ht 66.0 in | Wt 162.0 lb

## 2016-08-29 DIAGNOSIS — Z13820 Encounter for screening for osteoporosis: Secondary | ICD-10-CM | POA: Diagnosis not present

## 2016-08-29 DIAGNOSIS — Z79890 Hormone replacement therapy (postmenopausal): Secondary | ICD-10-CM | POA: Diagnosis not present

## 2016-08-29 DIAGNOSIS — Z01419 Encounter for gynecological examination (general) (routine) without abnormal findings: Secondary | ICD-10-CM | POA: Diagnosis not present

## 2016-08-29 DIAGNOSIS — Z13220 Encounter for screening for lipoid disorders: Secondary | ICD-10-CM | POA: Diagnosis not present

## 2016-08-29 LAB — LIPID PANEL
Cholesterol: 233 mg/dL — ABNORMAL HIGH (ref <200)
HDL: 54 mg/dL (ref >50)
LDL Cholesterol: 149 mg/dL — ABNORMAL HIGH (ref <100)
Total CHOL/HDL Ratio: 4.3 Ratio (ref <5.0)
Triglycerides: 152 mg/dL — ABNORMAL HIGH (ref <150)
VLDL: 30 mg/dL (ref <30)

## 2016-08-29 MED ORDER — LEVOTHYROXINE SODIUM 50 MCG PO TABS: 50.0000 ug | ORAL_TABLET | Freq: Every day | ORAL | 4 refills | Status: DC

## 2016-08-29 MED ORDER — ESTRADIOL 0.05 MG/24HR TD PTTW: 1.0000 | MEDICATED_PATCH | TRANSDERMAL | 4 refills | Status: DC

## 2016-08-29 NOTE — Progress Notes (Signed)
Sharon Kirk November 21, 1960 [id number]    History:    Presents for annual exam. 1991TVH for menorrhagia on Vivelle-Dot 0.0375 with hot flushes causing poor sleep. Normal Pap and mammogram history. 04/2016 left lower leg melanoma with positive lymph node currently on Optivo. 2016 benign thyroid nodule. 2013 normal DEXA. 2010 negative colonoscopy. Hypothyroid primary care manages.  Past medical history, past surgical history, family history and social history were all reviewed and documented in the EPIC chart. Works at [employer] in [employer], art.  ROS:  A ROS was performed and pertinent positives and negatives are included.  Exam:  Vitals:   08/29/16 0811  BP: 112/70  Weight: 162 lb (73.5 kg)  Height: 5\' 6"  (1.676 m)   Body mass index is 26.15 kg/m.   General appearance:  Normal Thyroid:  Symmetrical, normal in size, without palpable masses or nodularity. Respiratory  Auscultation:  Clear without wheezing or rhonchi Cardiovascular  Auscultation:  Regular rate, without rubs, murmurs or gallops  Edema/varicosities:  Not grossly evident Abdominal  Soft,nontender, without masses, guarding or rebound.  Liver/spleen:  No organomegaly noted  Hernia:  None appreciated  Skin  Inspection:  Grossly normal   Breasts: Examined lying and sitting.     Right: Without masses, retractions, discharge or axillary adenopathy.     Left: Without masses, retractions, discharge or axillary adenopathy. Gentitourinary   Inguinal/mons:  Normal without inguinal adenopathy  External genitalia:  Normal  BUS/Urethra/Skene's glands:  Normal  Vagina:  Normal  Cervix:  And uterus absent  Adnexa/parametria:     Rt: Without masses or tenderness.   Lt: Without masses or tenderness.  Anus and perineum: Normal  Digital rectal exam: Normal sphincter tone without palpated masses or tenderness  Assessment/Plan:  55 y.o. MWF G0 for annual exam.   1991 TVH for menorrhagia on HRT with hot flashes 04/2016 left lower leg  melanoma with positive lymph node on optivo last dose of 4 doses      scheduled next week Hypothyroid on Synthroid 50 g  Plan: Reviewed normal TSH, CMP, CBC. Will check lipid panel, vitamin D, UA. Schedule DEXA. Reviewed importance of decreasing carbs in diet (25 pound wt gain) increasing exercise can start with 10 minute increments. Continues to have some left leg swelling. Aware of importance of avoiding colds, flus and viruses. SBE's, continue annual 3-D screening mammogram history of dense breasts. Having increased hot flushes will try Vivelle-Dot 0.05 patch twice weekly, instructed to call if no relief of hot flushes.   Huel Cote [facility], 8:18 AM 08/29/2016

## 2016-08-29 NOTE — Patient Instructions (Signed)

## 2016-08-30 ENCOUNTER — Encounter: Payer: Self-pay | Admitting: Women's Health

## 2016-08-30 ENCOUNTER — Other Ambulatory Visit: Payer: Self-pay | Admitting: Women's Health

## 2016-08-30 ENCOUNTER — Encounter: Payer: Self-pay | Admitting: Gynecology

## 2016-08-30 DIAGNOSIS — E7800 Pure hypercholesterolemia, unspecified: Secondary | ICD-10-CM

## 2016-08-30 LAB — URINALYSIS W MICROSCOPIC + REFLEX CULTURE
Bacteria, UA: NONE SEEN [HPF]
Bilirubin Urine: NEGATIVE
Casts: NONE SEEN [LPF]
Crystals: NONE SEEN [HPF]
Glucose, UA: NEGATIVE
Hgb urine dipstick: NEGATIVE
Ketones, ur: NEGATIVE
Leukocytes, UA: NEGATIVE
Nitrite: NEGATIVE
Protein, ur: NEGATIVE
RBC / HPF: NONE SEEN RBC/HPF (ref <=2)
Specific Gravity, Urine: 1.004 (ref 1.001–1.035)
WBC, UA: NONE SEEN WBC/HPF (ref <=5)
Yeast: NONE SEEN [HPF]
pH: 7 (ref 5.0–8.0)

## 2016-08-30 LAB — VITAMIN D 25 HYDROXY (VIT D DEFICIENCY, FRACTURES): Vit D, 25-Hydroxy: 38 ng/mL (ref 30–100)

## 2016-09-01 ENCOUNTER — Other Ambulatory Visit: Payer: BLUE CROSS/BLUE SHIELD

## 2016-09-08 ENCOUNTER — Ambulatory Visit (HOSPITAL_BASED_OUTPATIENT_CLINIC_OR_DEPARTMENT_OTHER): Payer: BLUE CROSS/BLUE SHIELD

## 2016-09-08 ENCOUNTER — Other Ambulatory Visit (HOSPITAL_BASED_OUTPATIENT_CLINIC_OR_DEPARTMENT_OTHER): Payer: BLUE CROSS/BLUE SHIELD

## 2016-09-08 VITALS — BP 105/67 | HR 66 | Temp 97.8°F | Resp 18

## 2016-09-08 DIAGNOSIS — C4361 Malignant melanoma of right upper limb, including shoulder: Secondary | ICD-10-CM | POA: Diagnosis not present

## 2016-09-08 DIAGNOSIS — C439 Malignant melanoma of skin, unspecified: Secondary | ICD-10-CM

## 2016-09-08 DIAGNOSIS — Z5112 Encounter for antineoplastic immunotherapy: Secondary | ICD-10-CM

## 2016-09-08 DIAGNOSIS — C4372 Malignant melanoma of left lower limb, including hip: Secondary | ICD-10-CM | POA: Diagnosis not present

## 2016-09-08 LAB — COMPREHENSIVE METABOLIC PANEL
ALT: 16 U/L (ref 0–55)
AST: 13 U/L (ref 5–34)
Albumin: 3.6 g/dL (ref 3.5–5.0)
Alkaline Phosphatase: 61 U/L (ref 40–150)
Anion Gap: 9 mEq/L (ref 3–11)
BUN: 11.2 mg/dL (ref 7.0–26.0)
CO2: 23 mEq/L (ref 22–29)
Calcium: 9.1 mg/dL (ref 8.4–10.4)
Chloride: 108 mEq/L (ref 98–109)
Creatinine: 0.7 mg/dL (ref 0.6–1.1)
EGFR: >90 mL/min/{1.73_m2} (ref >90)
Glucose: 87 mg/dl (ref 70–140)
Potassium: 3.9 mEq/L (ref 3.5–5.1)
Sodium: 140 mEq/L (ref 136–145)
Total Bilirubin: 0.42 mg/dL (ref 0.20–1.20)
Total Protein: 6.6 g/dL (ref 6.4–8.3)

## 2016-09-08 LAB — CBC WITH DIFFERENTIAL/PLATELET
BASO%: 0.7 % (ref 0.0–2.0)
Basophils Absolute: 0 10*3/uL (ref 0.0–0.1)
EOS%: 1.7 % (ref 0.0–7.0)
Eosinophils Absolute: 0.1 10*3/uL (ref 0.0–0.5)
HCT: 39.8 % (ref 34.8–46.6)
HGB: 13.5 g/dL (ref 11.6–15.9)
LYMPH%: 29.3 % (ref 14.0–49.7)
MCH: 28.6 pg (ref 25.1–34.0)
MCHC: 33.9 g/dL (ref 31.5–36.0)
MCV: 84.3 fL (ref 79.5–101.0)
MONO#: 0.4 10*3/uL (ref 0.1–0.9)
MONO%: 9.3 % (ref 0.0–14.0)
NEUT#: 2.5 10*3/uL (ref 1.5–6.5)
NEUT%: 59 % (ref 38.4–76.8)
Platelets: 208 10*3/uL (ref 145–400)
RBC: 4.72 10*6/uL (ref 3.70–5.45)
RDW: 13.5 % (ref 11.2–14.5)
WBC: 4.2 10*3/uL (ref 3.9–10.3)
lymph#: 1.2 10*3/uL (ref 0.9–3.3)

## 2016-09-08 MED ORDER — SODIUM CHLORIDE 0.9 % IV SOLN
Freq: Once | INTRAVENOUS | Status: AC
  Administered 2016-09-08: 09:00:00 via INTRAVENOUS

## 2016-09-08 MED ORDER — SODIUM CHLORIDE 0.9 % IV SOLN
240.0000 mg | Freq: Once | INTRAVENOUS | Status: AC
  Administered 2016-09-08: 240 mg via INTRAVENOUS
  Filled 2016-09-08: qty 20

## 2016-09-08 NOTE — Patient Instructions (Signed)
[facility] Discharge Instructions for Patients Receiving Chemotherapy  Today you received the following chemotherapy agents Opdivo.  To help prevent nausea and vomiting after your treatment, we encourage you to take your nausea medication as directed.   If you develop nausea and vomiting that is not controlled by your nausea medication, call the clinic.   BELOW ARE SYMPTOMS THAT SHOULD BE REPORTED IMMEDIATELY:  *FEVER GREATER THAN 100.5 F  *CHILLS WITH OR WITHOUT FEVER  NAUSEA AND VOMITING THAT IS NOT CONTROLLED WITH YOUR NAUSEA MEDICATION  *UNUSUAL SHORTNESS OF BREATH  *UNUSUAL BRUISING OR BLEEDING  TENDERNESS IN MOUTH AND THROAT WITH OR WITHOUT PRESENCE OF ULCERS  *URINARY PROBLEMS  *BOWEL PROBLEMS  UNUSUAL RASH Items with * indicate a potential emergency and should be followed up as soon as possible.  Feel free to call the clinic you have any questions or concerns. The clinic phone number is [phone].  Please show the CHEMO ALERT CARD at check-in to the Emergency Department and triage nurse.    

## 2016-09-18 ENCOUNTER — Encounter: Payer: Self-pay | Admitting: Gynecology

## 2016-09-18 ENCOUNTER — Ambulatory Visit (INDEPENDENT_AMBULATORY_CARE_PROVIDER_SITE_OTHER): Payer: BLUE CROSS/BLUE SHIELD

## 2016-09-18 DIAGNOSIS — Z13820 Encounter for screening for osteoporosis: Secondary | ICD-10-CM

## 2016-09-18 DIAGNOSIS — M899 Disorder of bone, unspecified: Secondary | ICD-10-CM | POA: Diagnosis not present

## 2016-09-19 ENCOUNTER — Encounter: Payer: Self-pay | Admitting: Oncology

## 2016-09-19 ENCOUNTER — Other Ambulatory Visit: Payer: Self-pay | Admitting: Gynecology

## 2016-09-19 DIAGNOSIS — M899 Disorder of bone, unspecified: Secondary | ICD-10-CM

## 2016-09-19 DIAGNOSIS — Z13820 Encounter for screening for osteoporosis: Secondary | ICD-10-CM

## 2016-09-19 NOTE — Progress Notes (Signed)
Received BMS re-enrollment application for Obdivo. Patient and physician need to sign to submit to BMS. Called patient and left voicemail to return my call.

## 2016-09-20 ENCOUNTER — Encounter: Payer: Self-pay | Admitting: Oncology

## 2016-09-20 NOTE — Progress Notes (Signed)
Patient returned my call. Advised her of BMS enrollment and needing signature. Patient states she is done with her treatment and wanted to know if this is something new. Advised patient they(BMS) automatically sends it out when it is time to renew and information would only be needed if she is still taking the drug. Patient verbalized understanding.

## 2016-10-10 DIAGNOSIS — L432 Lichenoid drug reaction: Secondary | ICD-10-CM | POA: Diagnosis not present

## 2016-10-10 DIAGNOSIS — D0361 Melanoma in situ of right upper limb, including shoulder: Secondary | ICD-10-CM | POA: Diagnosis not present

## 2016-10-10 DIAGNOSIS — D225 Melanocytic nevi of trunk: Secondary | ICD-10-CM | POA: Diagnosis not present

## 2016-10-10 DIAGNOSIS — D2261 Melanocytic nevi of right upper limb, including shoulder: Secondary | ICD-10-CM | POA: Diagnosis not present

## 2016-10-10 DIAGNOSIS — Z85820 Personal history of malignant melanoma of skin: Secondary | ICD-10-CM | POA: Diagnosis not present

## 2016-10-10 DIAGNOSIS — D2262 Melanocytic nevi of left upper limb, including shoulder: Secondary | ICD-10-CM | POA: Diagnosis not present

## 2016-10-10 DIAGNOSIS — L821 Other seborrheic keratosis: Secondary | ICD-10-CM | POA: Diagnosis not present

## 2016-10-10 DIAGNOSIS — D485 Neoplasm of uncertain behavior of skin: Secondary | ICD-10-CM | POA: Diagnosis not present

## 2016-10-26 DIAGNOSIS — C4361 Malignant melanoma of right upper limb, including shoulder: Secondary | ICD-10-CM | POA: Diagnosis not present

## 2016-10-26 DIAGNOSIS — C4372 Malignant melanoma of left lower limb, including hip: Secondary | ICD-10-CM | POA: Diagnosis not present

## 2016-11-07 DIAGNOSIS — L905 Scar conditions and fibrosis of skin: Secondary | ICD-10-CM | POA: Diagnosis not present

## 2016-11-07 DIAGNOSIS — D0361 Melanoma in situ of right upper limb, including shoulder: Secondary | ICD-10-CM | POA: Diagnosis not present

## 2016-11-15 DIAGNOSIS — R194 Change in bowel habit: Secondary | ICD-10-CM | POA: Diagnosis not present

## 2016-11-15 DIAGNOSIS — R159 Full incontinence of feces: Secondary | ICD-10-CM | POA: Diagnosis not present

## 2016-11-30 DIAGNOSIS — E039 Hypothyroidism, unspecified: Secondary | ICD-10-CM | POA: Diagnosis not present

## 2016-11-30 DIAGNOSIS — E890 Postprocedural hypothyroidism: Secondary | ICD-10-CM | POA: Diagnosis not present

## 2017-01-09 DIAGNOSIS — D2261 Melanocytic nevi of right upper limb, including shoulder: Secondary | ICD-10-CM | POA: Diagnosis not present

## 2017-01-09 DIAGNOSIS — L820 Inflamed seborrheic keratosis: Secondary | ICD-10-CM | POA: Diagnosis not present

## 2017-01-09 DIAGNOSIS — D225 Melanocytic nevi of trunk: Secondary | ICD-10-CM | POA: Diagnosis not present

## 2017-01-09 DIAGNOSIS — Z85820 Personal history of malignant melanoma of skin: Secondary | ICD-10-CM | POA: Diagnosis not present

## 2017-01-09 DIAGNOSIS — D2262 Melanocytic nevi of left upper limb, including shoulder: Secondary | ICD-10-CM | POA: Diagnosis not present

## 2017-02-21 DIAGNOSIS — B3749 Other urogenital candidiasis: Secondary | ICD-10-CM | POA: Diagnosis not present

## 2017-02-21 DIAGNOSIS — R159 Full incontinence of feces: Secondary | ICD-10-CM | POA: Diagnosis not present

## 2017-02-28 ENCOUNTER — Encounter: Payer: Self-pay | Admitting: Gynecology

## 2017-03-02 ENCOUNTER — Other Ambulatory Visit: Payer: Self-pay | Admitting: Women's Health

## 2017-03-02 DIAGNOSIS — Z1231 Encounter for screening mammogram for malignant neoplasm of breast: Secondary | ICD-10-CM

## 2017-03-21 DIAGNOSIS — R194 Change in bowel habit: Secondary | ICD-10-CM | POA: Diagnosis not present

## 2017-03-21 DIAGNOSIS — R159 Full incontinence of feces: Secondary | ICD-10-CM | POA: Diagnosis not present

## 2017-03-21 DIAGNOSIS — R150 Incomplete defecation: Secondary | ICD-10-CM | POA: Diagnosis not present

## 2017-03-27 ENCOUNTER — Encounter: Payer: Self-pay | Admitting: Women's Health

## 2017-03-27 NOTE — Telephone Encounter (Signed)
Sharon Kirk, I am sure you want her to have annual exam. If you just tell me what you would like for me to tell her I am happy to call her and keep you from having to be on the phone.

## 2017-04-10 DIAGNOSIS — D2261 Melanocytic nevi of right upper limb, including shoulder: Secondary | ICD-10-CM | POA: Diagnosis not present

## 2017-04-10 DIAGNOSIS — D2262 Melanocytic nevi of left upper limb, including shoulder: Secondary | ICD-10-CM | POA: Diagnosis not present

## 2017-04-10 DIAGNOSIS — D225 Melanocytic nevi of trunk: Secondary | ICD-10-CM | POA: Diagnosis not present

## 2017-04-10 DIAGNOSIS — Z85820 Personal history of malignant melanoma of skin: Secondary | ICD-10-CM | POA: Diagnosis not present

## 2017-04-11 DIAGNOSIS — R194 Change in bowel habit: Secondary | ICD-10-CM | POA: Diagnosis not present

## 2017-04-11 DIAGNOSIS — R159 Full incontinence of feces: Secondary | ICD-10-CM | POA: Diagnosis not present

## 2017-04-24 DIAGNOSIS — D485 Neoplasm of uncertain behavior of skin: Secondary | ICD-10-CM | POA: Diagnosis not present

## 2017-04-24 DIAGNOSIS — L988 Other specified disorders of the skin and subcutaneous tissue: Secondary | ICD-10-CM | POA: Diagnosis not present

## 2017-04-25 DIAGNOSIS — R159 Full incontinence of feces: Secondary | ICD-10-CM | POA: Diagnosis not present

## 2017-04-25 DIAGNOSIS — R194 Change in bowel habit: Secondary | ICD-10-CM | POA: Diagnosis not present

## 2017-05-01 DIAGNOSIS — R159 Full incontinence of feces: Secondary | ICD-10-CM | POA: Diagnosis not present

## 2017-05-01 DIAGNOSIS — R194 Change in bowel habit: Secondary | ICD-10-CM | POA: Diagnosis not present

## 2017-05-24 DIAGNOSIS — E039 Hypothyroidism, unspecified: Secondary | ICD-10-CM | POA: Diagnosis not present

## 2017-05-24 DIAGNOSIS — M2550 Pain in unspecified joint: Secondary | ICD-10-CM | POA: Diagnosis not present

## 2017-05-24 DIAGNOSIS — E890 Postprocedural hypothyroidism: Secondary | ICD-10-CM | POA: Diagnosis not present

## 2017-06-11 ENCOUNTER — Ambulatory Visit (INDEPENDENT_AMBULATORY_CARE_PROVIDER_SITE_OTHER): Payer: BLUE CROSS/BLUE SHIELD | Admitting: Women's Health

## 2017-06-11 ENCOUNTER — Encounter: Payer: Self-pay | Admitting: Women's Health

## 2017-06-11 VITALS — BP 122/74 | Ht 66.0 in | Wt 162.0 lb

## 2017-06-11 DIAGNOSIS — N898 Other specified noninflammatory disorders of vagina: Secondary | ICD-10-CM

## 2017-06-11 DIAGNOSIS — N952 Postmenopausal atrophic vaginitis: Secondary | ICD-10-CM

## 2017-06-11 DIAGNOSIS — R350 Frequency of micturition: Secondary | ICD-10-CM | POA: Diagnosis not present

## 2017-06-11 LAB — URINALYSIS W MICROSCOPIC + REFLEX CULTURE
Bacteria, UA: NONE SEEN [HPF]
Bilirubin Urine: NEGATIVE
Casts: NONE SEEN [LPF]
Crystals: NONE SEEN [HPF]
Glucose, UA: NEGATIVE
Hgb urine dipstick: NEGATIVE
Ketones, ur: NEGATIVE
Leukocytes, UA: NEGATIVE
Nitrite: NEGATIVE
Protein, ur: NEGATIVE
RBC / HPF: NONE SEEN RBC/HPF (ref <=2)
Specific Gravity, Urine: 1.015 (ref 1.001–1.035)
WBC, UA: NONE SEEN WBC/HPF (ref <=5)
Yeast: NONE SEEN [HPF]
pH: 6 (ref 5.0–8.0)

## 2017-06-11 LAB — WET PREP FOR TRICH, YEAST, CLUE
Clue Cells Wet Prep HPF POC: NONE SEEN
Trich, Wet Prep: NONE SEEN
WBC, Wet Prep HPF POC: NONE SEEN
Yeast Wet Prep HPF POC: NONE SEEN

## 2017-06-11 MED ORDER — ESTRADIOL 2 MG VA RING: 2.0000 mg | VAGINAL_RING | VAGINAL | 3 refills | Status: DC

## 2017-06-11 NOTE — Progress Notes (Signed)
Presents with several issues, states is having increased urinary frequency with burning, attributes frequency to increase fluid intake. Occasional stool incontinence which she has seen a physical therapist for weak rectal sphincter per Dr. Allene Pyo. Burning sensation upon initiation of urination that she attributes to a skin rash. Denies pain at end of stream of urination, abdominal pain or fever. Has had frequency for months, stool incontinence started end of May 2018, states does take a magnesium and fiber supplement. TVH on Vivelle 0.5 patch with good relief of hot flushes. Significant medical history 04/2016 melanoma has bi annual skin checks. Hypothyroid on Synthroid.    Exam: Appears well. No CVAT. Abdomen soft, external genitalia erythematous at posterior fourchette extending down toward rectum. Speculum exam with no visible discharge, wet prep negative. UA: Negative leukocytes, no wbc's or bacteria noted.  Vaginal irritation/genitourinary syndrome of menopause  Plan: Options reviewed will try Estring 2 mg per vagina every 3 months, prescription, proper use, reviewed minimal systemic absorption but may help with the frequency. Encouraged to continue fluids but to limit magnesium and fiber supplements. Instructed to call if continued problems.

## 2017-07-23 DIAGNOSIS — L820 Inflamed seborrheic keratosis: Secondary | ICD-10-CM | POA: Diagnosis not present

## 2017-07-23 DIAGNOSIS — Z85820 Personal history of malignant melanoma of skin: Secondary | ICD-10-CM | POA: Diagnosis not present

## 2017-07-23 DIAGNOSIS — D225 Melanocytic nevi of trunk: Secondary | ICD-10-CM | POA: Diagnosis not present

## 2017-07-23 DIAGNOSIS — D485 Neoplasm of uncertain behavior of skin: Secondary | ICD-10-CM | POA: Diagnosis not present

## 2017-07-23 DIAGNOSIS — D2262 Melanocytic nevi of left upper limb, including shoulder: Secondary | ICD-10-CM | POA: Diagnosis not present

## 2017-07-23 DIAGNOSIS — D2261 Melanocytic nevi of right upper limb, including shoulder: Secondary | ICD-10-CM | POA: Diagnosis not present

## 2017-08-07 DIAGNOSIS — D2261 Melanocytic nevi of right upper limb, including shoulder: Secondary | ICD-10-CM | POA: Diagnosis not present

## 2017-08-07 DIAGNOSIS — E890 Postprocedural hypothyroidism: Secondary | ICD-10-CM | POA: Diagnosis not present

## 2017-08-07 DIAGNOSIS — D485 Neoplasm of uncertain behavior of skin: Secondary | ICD-10-CM | POA: Diagnosis not present

## 2017-08-13 ENCOUNTER — Ambulatory Visit
Admission: RE | Admit: 2017-08-13 | Discharge: 2017-08-13 | Disposition: A | Discharge status: Home / Self Care | Payer: BLUE CROSS/BLUE SHIELD | Source: Ambulatory Visit | Attending: Women's Health | Admitting: Women's Health

## 2017-08-13 DIAGNOSIS — Z1231 Encounter for screening mammogram for malignant neoplasm of breast: Secondary | ICD-10-CM

## 2017-08-17 ENCOUNTER — Encounter: Payer: Self-pay | Admitting: Women's Health

## 2017-08-22 DIAGNOSIS — H04123 Dry eye syndrome of bilateral lacrimal glands: Secondary | ICD-10-CM | POA: Diagnosis not present

## 2017-09-05 ENCOUNTER — Encounter: Payer: BLUE CROSS/BLUE SHIELD | Admitting: Women's Health

## 2017-09-18 ENCOUNTER — Ambulatory Visit (INDEPENDENT_AMBULATORY_CARE_PROVIDER_SITE_OTHER): Payer: BLUE CROSS/BLUE SHIELD | Admitting: Women's Health

## 2017-09-18 ENCOUNTER — Encounter: Payer: Self-pay | Admitting: Women's Health

## 2017-09-18 VITALS — BP 120/80 | Ht 66.0 in | Wt 160.0 lb

## 2017-09-18 DIAGNOSIS — Z01419 Encounter for gynecological examination (general) (routine) without abnormal findings: Secondary | ICD-10-CM | POA: Diagnosis not present

## 2017-09-18 DIAGNOSIS — Z13820 Encounter for screening for osteoporosis: Secondary | ICD-10-CM

## 2017-09-18 DIAGNOSIS — Z79890 Hormone replacement therapy (postmenopausal): Secondary | ICD-10-CM | POA: Diagnosis not present

## 2017-09-18 MED ORDER — ESTRADIOL 0.05 MG/24HR TD PTTW: 1.0000 | MEDICATED_PATCH | TRANSDERMAL | 4 refills | Status: DC

## 2017-09-18 NOTE — Progress Notes (Signed)
[name] 11-30-1960 [id number]    History:    Presents for annual exam. 1991 TVH for menorrhagia on Vivelle patch. States is having to use 2 patches every Saturday, was not changing them twice weekly. 2013 normal DEXA. Normal Pap and mammogram history. 2010 negative colonoscopy. Hypothyroid endocrinologist manages, Dr. Carlis Kirk is retiring and asked for a referral. Primary care manages labs. Melanoma 04/2016 on left lower leg has also had several spots on her right arm, six-month follow-ups. Had been on Optiva last year.  Past medical history, past surgical history, family history and social history were all reviewed and documented in the EPIC chart. Works at [employer].  ROS:  A ROS was performed and pertinent positives and negatives are included.  Exam:  Vitals:   09/18/17 0802  BP: 120/80  Weight: 160 lb (72.6 kg)  Height: 5\' 6"  (1.676 m)   Body mass index is 25.82 kg/m.   General appearance:  Normal Thyroid:  Symmetrical, normal in size, without palpable masses or nodularity. Respiratory  Auscultation:  Clear without wheezing or rhonchi Cardiovascular  Auscultation:  Regular rate, without rubs, murmurs or gallops  Edema/varicosities:  Not grossly evident Abdominal  Soft,nontender, without masses, guarding or rebound.  Liver/spleen:  No organomegaly noted  Hernia:  None appreciated  Skin  Inspection:  Grossly normal   Breasts: Examined lying and sitting.     Right: Without masses, retractions, discharge or axillary adenopathy.     Left: Without masses, retractions, discharge or axillary adenopathy. Gentitourinary   Inguinal/mons:  Normal without inguinal adenopathy  External genitalia:  Normal  BUS/Urethra/Skene's glands:  Normal  Vagina:  Normal  Cervix:  And uterus absent Adnexa/parametria:     Rt: Without masses or tenderness.   Lt: Without masses or tenderness.  Anus and perineum: Normal  Digital rectal exam: Normal sphincter tone without palpated masses or  tenderness  Assessment/Plan:  56 y.o. MWF G0  for annual exam with complaint of hot flushes.  66 TVH for menorrhagia on Vivelle patch 04/2016 melanoma dermatologist managing Hypothyroid-endocrinologist managing, history of benign nodules Primary care manages labs  Plan: Referral to Dr. Orpah Kirk (endocrinologist). Aware of importance of follow-up with dermatologist.  Vivelle patch 0.05 patch twice weekly, had been using once weekly. Will call if continued problems. Reviewed slight risk for blood clots, strokes and breast cancer. Prescription given. SBE's, exercise, calcium rich diet, vitamin D 2000 daily encouraged. Repeat DEXA. Reviewed importance of weightbearing exercise, home safety and fall prevention discussed.    [place], 8:28 AM 09/18/2017

## 2017-09-18 NOTE — Patient Instructions (Signed)
Health Maintenance for Postmenopausal Women Menopause is a normal process in which your reproductive ability comes to an end. This process happens gradually over a span of months to years, usually between the ages of 22 and 9. Menopause is complete when you have missed 12 consecutive menstrual periods. It is important to talk with your health care provider about some of the most common conditions that affect postmenopausal women, such as heart disease, cancer, and bone loss (osteoporosis). Adopting a healthy lifestyle and getting preventive care can help to promote your health and wellness. Those actions can also lower your chances of developing some of these common conditions. What should I know about menopause? During menopause, you may experience a number of symptoms, such as:  Moderate-to-severe hot flashes.  Night sweats.  Decrease in sex drive.  Mood swings.  Headaches.  Tiredness.  Irritability.  Memory problems.  Insomnia.  Choosing to treat or not to treat menopausal changes is an individual decision that you make with your health care provider. What should I know about hormone replacement therapy and supplements? Hormone therapy products are effective for treating symptoms that are associated with menopause, such as hot flashes and night sweats. Hormone replacement carries certain risks, especially as you become older. If you are thinking about using estrogen or estrogen with progestin treatments, discuss the benefits and risks with your health care provider. What should I know about heart disease and stroke? Heart disease, heart attack, and stroke become more likely as you age. This may be due, in part, to the hormonal changes that your body experiences during menopause. These can affect how your body processes dietary fats, triglycerides, and cholesterol. Heart attack and stroke are both medical emergencies. There are many things that you can do to help prevent heart disease  and stroke:  Have your blood pressure checked at least every 1-2 years. High blood pressure causes heart disease and increases the risk of stroke.  If you are 53-22 years old, ask your health care provider if you should take aspirin to prevent a heart attack or a stroke.  Do not use any tobacco products, including cigarettes, chewing tobacco, or electronic cigarettes. If you need help quitting, ask your health care provider.  It is important to eat a healthy diet and maintain a healthy weight. ? Be sure to include plenty of vegetables, fruits, low-fat dairy products, and lean protein. ? Avoid eating foods that are high in solid fats, added sugars, or salt (sodium).  Get regular exercise. This is one of the most important things that you can do for your health. ? Try to exercise for at least 150 minutes each week. The type of exercise that you do should increase your heart rate and make you sweat. This is known as moderate-intensity exercise. ? Try to do strengthening exercises at least twice each week. Do these in addition to the moderate-intensity exercise.  Know your numbers.Ask your health care provider to check your cholesterol and your blood glucose. Continue to have your blood tested as directed by your health care provider.  What should I know about cancer screening? There are several types of cancer. Take the following steps to reduce your risk and to catch any cancer development as early as possible. Breast Cancer  Practice breast self-awareness. ? This means understanding how your breasts normally appear and feel. ? It also means doing regular breast self-exams. Let your health care provider know about any changes, no matter how small.  If you are 40  or older, have a clinician do a breast exam (clinical breast exam or CBE) every year. Depending on your age, family history, and medical history, it may be recommended that you also have a yearly breast X-ray (mammogram).  If you  have a family history of breast cancer, talk with your health care provider about genetic screening.  If you are at high risk for breast cancer, talk with your health care provider about having an MRI and a mammogram every year.  Breast cancer (BRCA) gene test is recommended for women who have family members with BRCA-related cancers. Results of the assessment will determine the need for genetic counseling and BRCA1 and for BRCA2 testing. BRCA-related cancers include these types: ? Breast. This occurs in males or females. ? Ovarian. ? Tubal. This may also be called fallopian tube cancer. ? Cancer of the abdominal or pelvic lining (peritoneal cancer). ? Prostate. ? Pancreatic.  Cervical, Uterine, and Ovarian Cancer Your health care provider may recommend that you be screened regularly for cancer of the pelvic organs. These include your ovaries, uterus, and vagina. This screening involves a pelvic exam, which includes checking for microscopic changes to the surface of your cervix (Pap test).  For women ages 21-65, health care providers may recommend a pelvic exam and a Pap test every three years. For women ages 79-65, they may recommend the Pap test and pelvic exam, combined with testing for human papilloma virus (HPV), every five years. Some types of HPV increase your risk of cervical cancer. Testing for HPV may also be done on women of any age who have unclear Pap test results.  Other health care providers may not recommend any screening for nonpregnant women who are considered low risk for pelvic cancer and have no symptoms. Ask your health care provider if a screening pelvic exam is right for you.  If you have had past treatment for cervical cancer or a condition that could lead to cancer, you need Pap tests and screening for cancer for at least 20 years after your treatment. If Pap tests have been discontinued for you, your risk factors (such as having a new sexual partner) need to be  reassessed to determine if you should start having screenings again. Some women have medical problems that increase the chance of getting cervical cancer. In these cases, your health care provider may recommend that you have screening and Pap tests more often.  If you have a family history of uterine cancer or ovarian cancer, talk with your health care provider about genetic screening.  If you have vaginal bleeding after reaching menopause, tell your health care provider.  There are currently no reliable tests available to screen for ovarian cancer.  Lung Cancer Lung cancer screening is recommended for adults 69-62 years old who are at high risk for lung cancer because of a history of smoking. A yearly low-dose CT scan of the lungs is recommended if you:  Currently smoke.  Have a history of at least 30 pack-years of smoking and you currently smoke or have quit within the past 15 years. A pack-year is smoking an average of one pack of cigarettes per day for one year.  Yearly screening should:  Continue until it has been 15 years since you quit.  Stop if you develop a health problem that would prevent you from having lung cancer treatment.  Colorectal Cancer  This type of cancer can be detected and can often be prevented.  Routine colorectal cancer screening usually begins at  age 42 and continues through age 45.  If you have risk factors for colon cancer, your health care provider may recommend that you be screened at an earlier age.  If you have a family history of colorectal cancer, talk with your health care provider about genetic screening.  Your health care provider may also recommend using home test kits to check for hidden blood in your stool.  A small camera at the end of a tube can be used to examine your colon directly (sigmoidoscopy or colonoscopy). This is done to check for the earliest forms of colorectal cancer.  Direct examination of the colon should be repeated every  5-10 years until age 71. However, if early forms of precancerous polyps or small growths are found or if you have a family history or genetic risk for colorectal cancer, you may need to be screened more often.  Skin Cancer  Check your skin from head to toe regularly.  Monitor any moles. Be sure to tell your health care provider: ? About any new moles or changes in moles, especially if there is a change in a mole's shape or color. ? If you have a mole that is larger than the size of a pencil eraser.  If any of your family members has a history of skin cancer, especially at a Berle Fitz age, talk with your health care provider about genetic screening.  Always use sunscreen. Apply sunscreen liberally and repeatedly throughout the day.  Whenever you are outside, protect yourself by wearing long sleeves, pants, a wide-brimmed hat, and sunglasses.  What should I know about osteoporosis? Osteoporosis is a condition in which bone destruction happens more quickly than new bone creation. After menopause, you may be at an increased risk for osteoporosis. To help prevent osteoporosis or the bone fractures that can happen because of osteoporosis, the following is recommended:  If you are 46-71 years old, get at least 1,000 mg of calcium and at least 600 mg of vitamin D per day.  If you are older than age 55 but younger than age 65, get at least 1,200 mg of calcium and at least 600 mg of vitamin D per day.  If you are older than age 54, get at least 1,200 mg of calcium and at least 800 mg of vitamin D per day.  Smoking and excessive alcohol intake increase the risk of osteoporosis. Eat foods that are rich in calcium and vitamin D, and do weight-bearing exercises several times each week as directed by your health care provider. What should I know about how menopause affects my mental health? Depression may occur at any age, but it is more common as you become older. Common symptoms of depression  include:  Low or sad mood.  Changes in sleep patterns.  Changes in appetite or eating patterns.  Feeling an overall lack of motivation or enjoyment of activities that you previously enjoyed.  Frequent crying spells.  Talk with your health care provider if you think that you are experiencing depression. What should I know about immunizations? It is important that you get and maintain your immunizations. These include:  Tetanus, diphtheria, and pertussis (Tdap) booster vaccine.  Influenza every year before the flu season begins.  Pneumonia vaccine.  Shingles vaccine.  Your health care provider may also recommend other immunizations. This information is not intended to replace advice given to you by your health care provider. Make sure you discuss any questions you have with your health care provider. Document Released: 11/24/2005  Document Revised: 04/21/2016 Document Reviewed: 07/06/2015 Elsevier Interactive Patient Education  2018 Elsevier Inc.  

## 2017-09-19 ENCOUNTER — Telehealth: Payer: Self-pay | Admitting: *Deleted

## 2017-09-19 DIAGNOSIS — E039 Hypothyroidism, unspecified: Secondary | ICD-10-CM

## 2017-09-19 NOTE — Telephone Encounter (Signed)
-----   Message from Huel Cote, NP sent at 09/18/2017  8:38 AM EST ----- Had been a patient of Dr. Carlis Abbott who is retiring. Hypothyroid with thyroid nodules. Please schedule appointment with Dr. Orpah Greek. thanks

## 2017-09-19 NOTE — Telephone Encounter (Signed)
Referral placed [place] office will contact pt to schedule.

## 2017-10-02 ENCOUNTER — Encounter: Payer: Self-pay | Admitting: *Deleted

## 2017-10-02 NOTE — Telephone Encounter (Signed)
Dr.Gherge office has left message for pt to call. I sent pt my chart message as well.

## 2017-11-01 DIAGNOSIS — H16143 Punctate keratitis, bilateral: Secondary | ICD-10-CM | POA: Diagnosis not present

## 2017-11-01 DIAGNOSIS — H43393 Other vitreous opacities, bilateral: Secondary | ICD-10-CM | POA: Diagnosis not present

## 2017-11-01 DIAGNOSIS — H04123 Dry eye syndrome of bilateral lacrimal glands: Secondary | ICD-10-CM | POA: Diagnosis not present

## 2017-11-01 DIAGNOSIS — H2513 Age-related nuclear cataract, bilateral: Secondary | ICD-10-CM | POA: Diagnosis not present

## 2017-12-12 ENCOUNTER — Ambulatory Visit (INDEPENDENT_AMBULATORY_CARE_PROVIDER_SITE_OTHER): Payer: BLUE CROSS/BLUE SHIELD | Admitting: Internal Medicine

## 2017-12-12 ENCOUNTER — Encounter: Payer: Self-pay | Admitting: Internal Medicine

## 2017-12-12 VITALS — BP 100/66 | HR 68 | Ht 66.0 in | Wt 163.0 lb

## 2017-12-12 DIAGNOSIS — E042 Nontoxic multinodular goiter: Secondary | ICD-10-CM

## 2017-12-12 DIAGNOSIS — E039 Hypothyroidism, unspecified: Secondary | ICD-10-CM

## 2017-12-12 LAB — T4, FREE: Free T4: 0.83 ng/dL (ref 0.60–1.60)

## 2017-12-12 LAB — TSH: TSH: 0.53 u[IU]/mL (ref 0.35–4.50)

## 2017-12-12 NOTE — Progress Notes (Addendum)
Patient ID: Sharon Kirk, female   DOB: 1961/10/07, 57 y.o.   MRN: [id number]    HPI  Sharon Kirk is a 57 y.o.-year-old femaleemale, referred by her PCP, Elon Alas, NP , for management of hypothyroidism and thyroid nodules.  She previously saw Dr. Jeanann Lewandowsky, who retired.  She has a history of thyroid nodules.  Per review of the chart, she had a thyroid nodule biopsy in 06/2010, but I do not have access to review these results (in Dr. Carlis Abbott 's office).  Most recent thyroid ultrasound (05/18/2015): She had a 3.2 cm left thyroid nodule and other smaller thyroid nodules scattered throughout her thyroid.  08/05/2015: FNA of the 3.2 cm thyroid nodule was benign  In 2014 >> started on Levothyroxine 50 mcg initially to shrink the nodules.  It is possible that she developed hypothyroidism afterwards and became dependent on the levothyroxine.  She takes the thyroid hormone: - fasting - with water - separated by >30 min from b'fast  - + calcium and vit D at night - no iron, PPIs, multivitamins  - stopped Biotin 1-2 mo ago.  I reviewed pt's thyroid tests: 08/2017: normal, reportedly Lab Results  Component Value Date   TSH 0.570 08/25/2016   TSH 0.990 08/14/2012    Pt denies: - feeling nodules in neck - hoarseness >> in pm - dysphagia >> increasing - choking - SOB with lying down - coughing >> started 2 mo ago >> especially after water  Pt mentions: - weight gain - fatigue - constipation  - dry skin  - hot flashes  But denies: - cold intolerance - depression - hair loss  She has no FH of thyroid disorders. No FH of thyroid cancer.  No h/o radiation tx to head or neck. No recent use of iodine supplements.  Pt. also has a history of metastatic melanoma in LNs.  ROS: Constitutional: + see HPI, + excessive urination Eyes: no blurry vision, no xerophthalmia ENT: no sore throat, + see HPI Cardiovascular: no CP/SOB/palpitations/leg swelling Respiratory: no  cough/SOB Gastrointestinal: no N/V/D/C Musculoskeletal: + Both muscle/joint aches, especially after walking Skin: no rashes, + easy bruising Neurological: no tremors/numbness/tingling/dizziness, + headaches Psychiatric: no depression/anxiety  Past Medical History:  Diagnosis Date  . Allergy   . Cancer ([place])    Melanoma  . Hyperlipidemia   . IBS (irritable bowel syndrome)   . Osteopenia   . PONV (postoperative nausea and vomiting)   . Thyroid nodule    Past Surgical History:  Procedure Laterality Date  . ABDOMINAL HYSTERECTOMY  1991  . DILATION AND CURETTAGE OF UTERUS  1988  . MELANOMA EXCISION Right 05/23/2016   Procedure: WIDE  EXCISION RIGHT SHOULDER MELANOMA AND WIDE EXCISION LEFT LOWER LEG MELANOMA;  Surgeon: Jackolyn Confer, MD;  Location: [facility];  Service: General;  Laterality: Right;  right shoulder and left lower leg sites  . SENTINEL NODE BIOPSY Left 05/23/2016   Procedure: LEFT INGUINAL SENTINEL LYMPH  NODE BIOPSY;  Surgeon: Jackolyn Confer, MD;  Location: [facility];  Service: General;  Laterality: Left;  . THYROID SURGERY  07/2015   NODULE   Social History   Socioeconomic History  . Marital status: Married    Spouse name: Audiological scientist  . Number of children: 0  . Years of education: 33  . Highest education level: Not on file  Social Needs  . Financial resource strain: Not on file  . Food insecurity - worry: Not on file  . Food  insecurity - inability: Not on file  . Transportation needs - medical: Not on file  . Transportation needs - non-medical: Not on file  Occupational History  . Occupation: Research scientist (physical sciences): [employer]  Tobacco Use  . Smoking status: Never Smoker  . Smokeless tobacco: Never Used  Substance and Sexual Activity  . Alcohol use: No  . Drug use: No  . Sexual activity: Yes    Partners: Male    Birth control/protection: Surgical  Other Topics Concern  . Not on file  Social History Narrative    Lives with her husband.   Current Outpatient Medications on File Prior to Visit  Medication Sig Dispense Refill  . calcium carbonate (OS-CAL) 600 MG TABS Take 600 mg by mouth 2 (two) times daily with a meal.      . cholecalciferol (VITAMIN D) 1000 UNITS tablet Take 5,000 Units by mouth daily.     . Cyanocobalamin (VITAMIN B-12 CR PO) Take 1 tablet by mouth daily.    Marland Kitchen estradiol (VIVELLE-DOT) 0.05 MG/24HR patch Place 1 patch (0.05 mg total) onto the skin 2 (two) times a week. 24 patch 4  . levothyroxine (SYNTHROID, LEVOTHROID) 50 MCG tablet Take 1 tablet (50 mcg total) by mouth daily before breakfast. 90 tablet 4  . Omega-3 Fatty Acids (FISH OIL) 1200 MG CAPS Take 2 capsules by mouth daily.     Marland Kitchen OVER THE COUNTER MEDICATION Multiple vitamin qd     . estradiol (ESTRING) 2 MG vaginal ring Place 2 mg vaginally every 3 (three) months. follow package directions (Patient not taking: Reported on 12/12/2017) 1 each 3   No current facility-administered medications on file prior to visit.    Allergies  Allergen Reactions  . Codeine Nausea Only and Other (See Comments)    Hallucinations; confusion   Family History  Problem Relation Age of Onset  . Diabetes Brother   . Hypertension Brother   . Cancer Maternal Grandmother        COLON  . Cancer Paternal Grandfather        PROSTATE  . Hypertension Mother   . Diabetes Brother   . Heart attack Father     PE: BP 100/66   Pulse 68   Ht 5\' 6"  (1.676 m)   Wt 163 lb (73.9 kg)   LMP 06/30/1990   SpO2 98%   BMI 26.31 kg/m  Wt Readings from Last 3 Encounters:  12/12/17 163 lb (73.9 kg)  09/18/17 160 lb (72.6 kg)  06/11/17 162 lb (73.5 kg)   Constitutional: overweight, in NAD Eyes: PERRLA, EOMI, no exophthalmos ENT: moist mucous membranes, + thyromegaly L>R, lumpy-bumpy thyroid at palpation, no cervical lymphadenopathy Cardiovascular: RRR, No MRG Respiratory: CTA B Gastrointestinal: abdomen soft, NT, ND, BS+ Musculoskeletal: no deformities,  strength intact in all 4 Skin: moist, warm, no rashes Neurological: no tremor with outstretched hands, DTR normal in all 4  ASSESSMENT: 1. Hypothyroidism  2. Thyroid nodules  PLAN:  1. Patient  was initially started on levothyroxine therapy to shrink her thyroid nodules, not because her TFTs were abnormal.  It is plausible that she developed hypothyroidism after starting levothyroxine, but we may be able to reduce the levothyroxine dose and even possibly stopping the future if her thyroid gland function recovers.  She agrees with this plan.  She is currently on the low dose levothyroxine, 50 mcg daily. - she appears euthyroid.  - Latest TFTs reviewed and they were normal in 08/2016, and reportedly in  08/2017 (I do not have these values) - We discussed about correct intake of levothyroxine, fasting, with water, separated by at least 30 minutes from breakfast, and separated by more than 4 hours from calcium, iron, multivitamins, acid reflux medications (PPIs). - will check thyroid tests today: TSH, free T4 and see if we can adjust the dose of levothyroxine down after the results are back - If labs today are abnormal, she will need to return in ~6 weeks for repeat labs - Otherwise, I will see her back in 6 months  2.  Thyroid nodules - Patient has a long history of thyroid nodules, of which the dominant one in the left lobe was biopsied with benign results in 2016.  The rest of the nodules was much smaller and not worrisome per review of the ultrasound report from 2016.  Compared to previous ultrasounds these nodules were either stable or smaller. - However, she does complain of neck compression symptoms, which have worsened - We discussed that if the thyroid nodules have grown or look particularly worrisome, we would need to  biopsy them.  If they are not concerning and they are of the same size or smaller, the decision to undergo thyroidectomy is hers, depending on the level of her discomfort.   She tells me that she would like to avoid surgery for now, as she does not feel that her symptoms are particularly bothersome. - We discussed about the possible use of other methods to shrink her nodules, for example radiofrequency ablation, which is not widely available at this time but I think it will become available in the next few years.  We cannot use ethanol ablation since her nodules are not cystic. - we will repeat her thyroid ultrasound now  Component     Latest Ref Rng & Units 12/12/2017  TSH     0.35 - 4.50 uIU/mL 0.53  T4,Free(Direct)     0.60 - 1.60 ng/dL 0.83  TSH is on the low side we will attempt to decrease the dose of levothyroxine to 25 >> micrograms and have her back for labs in 1.5 months.  If labs are still normal, will stop levothyroxine.  Status:  Final result Visible to patient:  No (Not Released) Dx:  Multiple thyroid nodules  Details   Reading Physician Reading Date Result Priority  Greggory Keen, MD 12/29/2017     Narrative    CLINICAL DATA: Thyroid nodules, previous left thyroid biopsy 08/05/2015  EXAM: THYROID ULTRASOUND  TECHNIQUE: Ultrasound examination of the thyroid gland and adjacent soft tissues was performed.  COMPARISON: 08/05/2015, 05/18/2015  FINDINGS: Parenchymal Echotexture: Moderately heterogenous  Isthmus: 7 mm, previously 9 mm  Right lobe: 4.7 x 1.2 x 1.2 cm, previously 4.9 x 1.8 x 1.8 cm  Left lobe: 5.1 x 2.0 x 2.2 cm, previously 5.4 x 1.9 x 1.9 cm  _________________________________________________________  Estimated total number of nodules >/= 1 cm: 1  Number of spongiform nodules >/= 2 cm not described below (TR1): 0  Number of mixed cystic and solid nodules >/= 1.5 cm not described below ([place]): 0  _________________________________________________________  Nodule # 4:  Prior biopsy: Yes  Location: Left; Inferior  Maximum size: 3.3, previously 3.2 cm; Other 2 dimensions: 1.6 x 2.2 cm, previously, 1.8 x 2.0  cm  Composition: solid/almost completely solid (2)  Echogenicity: isoechoic (1)  Shape: not taller-than-wide (0)  Margins: ill-defined (0)  Echogenic foci: none (0)  ACR TI-RADS total points: 3.  ACR TI-RADS risk category: TR3 (3  points).  Significant change in size (>/= 20% in two dimensions and minimal increase of 2 mm): No  Change in features: No  Change in ACR TI-RADS risk category: No  ACR TI-RADS recommendations:  **Given size (>/= 2.5 cm) and appearance, fine needle aspiration of this mildly suspicious nodule should be considered based on TI-RADS criteria. Please note nodule has been previously biopsied. Correlate with prior pathology.  _________________________________________________________  There are additional bilateral subcentimeter cystic and hypoechoic nodules noted all measuring 8 mm or less in size. These would not meet criteria for biopsy or any follow-up. No significant interval change.  No adenopathy  IMPRESSION: 3.3 cm left inferior TR 3 nodule meets criteria for biopsy which has already been performed 08/05/2015. Correlate with prior pathology.  Otherwise stable thyroid ultrasound  The above is in keeping with the ACR TI-RADS recommendations - J Am Coll Radiol 2017;14:587-595.       Will continue to keep an eye on the dominant nodule. No intervention needed for now.  Philemon Kingdom, MD PhD [facility] Endocrinology

## 2017-12-12 NOTE — Patient Instructions (Signed)
Please stop at the lab.  Please continue levothyroxine 50 mcg daily.  Take the thyroid hormone every day, with water, at least 30 minutes before breakfast, separated by at least 4 hours from: - acid reflux medications - calcium - iron - multivitamins  We will schedule a thyroid ultrasound for you.  Please let me know if you have not been called to schedule this in 1 week.  Please come back for a follow-up appointment in 6 months.   Thyroid Nodule A thyroid nodule is an isolatedgrowth of thyroid cells that forms a lump in your thyroid gland. The thyroid gland is a butterfly-shaped gland. It is found in the lower front of your neck. This gland sends chemical messengers (hormones) through your blood to all parts of your body. These hormones are important in regulating your body temperature and helping your body to use energy. Thyroid nodules are common. Most are not cancerous (are benign). You may have one nodule or several nodules. Different types of thyroid nodules include:  Nodules that grow and fill with fluid (thyroid cysts).  Nodules that produce too much thyroid hormone (hot nodules or hyperthyroid).  Nodules that produce no thyroid hormone (cold nodules or hypothyroid).  Nodules that form from cancer cells (thyroid cancers).  What are the causes? Usually, the cause of this condition is not known. What increases the risk? Factors that make this condition more likely to develop include:  Increasing age. Thyroid nodules become more common in people who are older than 59 years of age.  Gender. ? Benign thyroid nodules are more common in women. ? Cancerous (malignant) thyroid nodules are more common in men.  A family history that includes: ? Thyroid nodules. ? Pheochromocytoma. ? Thyroid carcinoma. ? Hyperparathyroidism.  Certain kinds of thyroid diseases, such as Hashimoto thyroiditis.  Lack of iodine.  A history of head and neck radiation, such as from  X-rays.  What are the signs or symptoms? It is common for this condition to cause no symptoms. If you have symptoms, they may include:  A lump in your lower neck.  Feeling a lump or tickle in your throat.  Pain in your neck, jaw, or ear.  Having trouble swallowing.  Hot nodules may cause symptoms that include:  Weight loss.  Warm, flushed skin.  Feeling hot.  Feeling nervous.  A racing heartbeat.  Cold nodules may cause symptoms that include:  Weight gain.  Dry skin.  Brittle hair. This may also occur with hair loss.  Feeling cold.  Fatigue.  Thyroid cancer nodules may cause symptoms that include:  Hard nodules that feel stuck to the thyroid gland.  Hoarseness.  Lumps in the glands near your thyroid (lymph nodes).  How is this diagnosed? A thyroid nodule may be felt by your health care provider during a physical exam. This condition may also be diagnosed based on your symptoms. You may also have tests, including:  An ultrasound. This may be done to confirm the diagnosis.  A biopsy. This involves taking a sample from the nodule and looking at it under a microscope to see if the nodule is benign.  Blood tests to make sure that your thyroid is working properly.  Imaging tests such as MRI or CT scan may be done if: ? Your nodule is large. ? Your nodule is blocking your airway. ? Cancer is suspected.  How is this treated? Treatment depends on the cause and size of your nodule or nodules. If the nodule is benign, treatment may  not be necessary. Your health care provider may monitor the nodule to see if it goes away without treatment. If the nodule continues to grow, is cancerous, or does not go away:  It may need to be drained with a needle.  It may need to be removed with surgery.  If you have surgery, part or all of your thyroid gland may need to be removed as well. Follow these instructions at home:  Pay attention to any changes in your  nodule.  Take over-the-counter and prescription medicines only as told by your health care provider.  Keep all follow-up visits as told by your health care provider. This is important. Contact a health care provider if:  Your voice changes.  You have trouble swallowing.  You have pain in your neck, ear, or jaw that is getting worse.  Your nodule gets bigger.  Your nodule starts to make it harder for you to breathe. Get help right away if:  You have a sudden fever.  You feel very weak.  Your muscles look like they are shrinking (muscle wasting).  You have mood swings.  You feel very restless.  You feel confused.  You are seeing or hearing things that other people do not see or hear (having hallucinations).  You feel suddenly nauseous or throw up.  You suddenly have diarrhea.  You have chest pain.  There is a loss of consciousness. This information is not intended to replace advice given to you by your health care provider. Make sure you discuss any questions you have with your health care provider. Document Released: 08/25/2004 Document Revised: 06/04/2016 Document Reviewed: 01/13/2015 Elsevier Interactive Patient Education  2018 [employer].

## 2017-12-13 MED ORDER — LEVOTHYROXINE SODIUM 50 MCG PO TABS: 25.0000 ug | ORAL_TABLET | Freq: Every day | ORAL | 4 refills | Status: DC

## 2017-12-14 ENCOUNTER — Encounter: Payer: Self-pay | Admitting: Internal Medicine

## 2017-12-28 ENCOUNTER — Ambulatory Visit
Admission: RE | Admit: 2017-12-28 | Discharge: 2017-12-28 | Disposition: A | Discharge status: Home / Self Care | Payer: BLUE CROSS/BLUE SHIELD | Source: Ambulatory Visit | Attending: Internal Medicine | Admitting: Internal Medicine

## 2017-12-28 DIAGNOSIS — E041 Nontoxic single thyroid nodule: Secondary | ICD-10-CM | POA: Diagnosis not present

## 2018-01-23 DIAGNOSIS — Z85820 Personal history of malignant melanoma of skin: Secondary | ICD-10-CM | POA: Diagnosis not present

## 2018-01-23 DIAGNOSIS — D225 Melanocytic nevi of trunk: Secondary | ICD-10-CM | POA: Diagnosis not present

## 2018-01-23 DIAGNOSIS — D2261 Melanocytic nevi of right upper limb, including shoulder: Secondary | ICD-10-CM | POA: Diagnosis not present

## 2018-01-23 DIAGNOSIS — H903 Sensorineural hearing loss, bilateral: Secondary | ICD-10-CM | POA: Diagnosis not present

## 2018-01-23 DIAGNOSIS — L821 Other seborrheic keratosis: Secondary | ICD-10-CM | POA: Diagnosis not present

## 2018-01-30 DIAGNOSIS — H10413 Chronic giant papillary conjunctivitis, bilateral: Secondary | ICD-10-CM | POA: Diagnosis not present

## 2018-01-30 DIAGNOSIS — H04123 Dry eye syndrome of bilateral lacrimal glands: Secondary | ICD-10-CM | POA: Diagnosis not present

## 2018-03-12 ENCOUNTER — Other Ambulatory Visit: Payer: Self-pay | Admitting: Women's Health

## 2018-03-12 DIAGNOSIS — Z1231 Encounter for screening mammogram for malignant neoplasm of breast: Secondary | ICD-10-CM

## 2018-06-11 ENCOUNTER — Ambulatory Visit (INDEPENDENT_AMBULATORY_CARE_PROVIDER_SITE_OTHER): Payer: BLUE CROSS/BLUE SHIELD | Admitting: Internal Medicine

## 2018-06-11 ENCOUNTER — Other Ambulatory Visit: Payer: Self-pay | Admitting: Internal Medicine

## 2018-06-11 ENCOUNTER — Encounter: Payer: Self-pay | Admitting: Internal Medicine

## 2018-06-11 ENCOUNTER — Other Ambulatory Visit: Payer: Self-pay | Admitting: Women's Health

## 2018-06-11 VITALS — BP 102/64 | HR 68 | Ht 65.95 in | Wt 160.4 lb

## 2018-06-11 DIAGNOSIS — E039 Hypothyroidism, unspecified: Secondary | ICD-10-CM

## 2018-06-11 DIAGNOSIS — E042 Nontoxic multinodular goiter: Secondary | ICD-10-CM | POA: Diagnosis not present

## 2018-06-11 DIAGNOSIS — M8580 Other specified disorders of bone density and structure, unspecified site: Secondary | ICD-10-CM

## 2018-06-11 LAB — TSH: TSH: 0.36 u[IU]/mL (ref 0.35–4.50)

## 2018-06-11 LAB — T4, FREE: Free T4: 0.9 ng/dL (ref 0.60–1.60)

## 2018-06-11 NOTE — Patient Instructions (Signed)
Please continue Levothyroxine 50 mcg daily.  Take the thyroid hormone every day, with water, at least 30 minutes before breakfast, separated by at least 4 hours from: - acid reflux medications - calcium - iron - multivitamins  Please stop at the lab.  Please come back for a follow-up appointment in 1 year. 

## 2018-06-11 NOTE — Progress Notes (Signed)
Patient ID: Sharon Kirk, female   DOB: [birth year], 59 y.o.   MRN: [id number]    HPI  Sharon Kirk is a 57 y.o.-year-old female, initially referred by her PCP, Elon Alas, NP , returning for follow-up for hypothyroidism and thyroid nodules.  She previously saw Dr. Jeanann Lewandowsky, who retired.  Last visit with me 6 months ago.  Reviewed and addended history: Thyroid nodule biopsy in 06/2010, but I do not have access to review these results (in Dr. Carlis Abbott 's office).  Thyroid ultrasound (05/18/2015): She had a 3.2 cm left thyroid nodule and other smaller thyroid nodules scattered throughout her thyroid.  08/05/2015: FNA of the 3.2 cm thyroid nodule >> benign  Thyroid ultrasound (12/29/2017): Stable 3.3 x 1.6 x 2.2 cm isoechoic left thyroid nodule and stable, smaller, thyroid nodules scattered throughout her thyroid  In 2014, she started on levothyroxine 50 mcg daily initially to shrink the nodules.  She remained on levothyroxine afterwards.  At last visit, I rec'd decrease the dose to 25 mcg daily in an effort to taper her levothyroxine to off.  She did not return for TFTs afterwards and she tells me today that she never decreased the dose.  Patient takes her levothyroxine: - in am - fasting - at least 30 min from b'fast - no Fe, MVI, PPIs - + Calcium and vitamin D at night - + Biotin - last dose 1 week ago  Reviewed patient's TFTs: Lab Results  Component Value Date   TSH 0.53 12/12/2017   TSH 0.570 08/25/2016   TSH 0.990 08/14/2012   FREET4 0.83 12/12/2017   08/2017: normal, reportedly  Pt denies: - feeling nodules in neck - hoarseness - choking - SOB with lying down  But she does complain of dysphagia (cough).  This is not new. 2011 Ba swallow >> nonspecific Es motility disorder.  She has no FH of thyroid disorders. No FH of thyroid cancer. No h/o radiation tx to head or neck.  No seaweed or kelp. No recent contrast studies. No herbal supplements. No Biotin use. No recent  steroids use.   Pt. also has a history of metastatic melanoma in LNs.  ROS: Constitutional: + weight gain/no weight loss, no fatigue, + subjective hyperthermia, no subjective hypothermia Eyes: no blurry vision, no xerophthalmia ENT: no sore throat, + see HPI Cardiovascular: no CP/no SOB/no palpitations/no leg swelling Respiratory: + cough/no SOB/no wheezing Gastrointestinal: no N/no V/no D/no C/no acid reflux Musculoskeletal: + muscle aches/+ joint aches Skin: no rashes, no hair loss Neurological: no tremors/no numbness/no tingling/no dizziness  I reviewed pt's medications, allergies, PMH, social hx, family hx, and changes were documented in the history of present illness. Otherwise, unchanged from my initial visit note.  Past Medical History:  Diagnosis Date  . Allergy   . Cancer ([place])    Melanoma  . Hyperlipidemia   . IBS (irritable bowel syndrome)   . Osteopenia   . PONV (postoperative nausea and vomiting)   . Thyroid nodule    Past Surgical History:  Procedure Laterality Date  . ABDOMINAL HYSTERECTOMY  1991  . DILATION AND CURETTAGE OF UTERUS  1988  . MELANOMA EXCISION Right 05/23/2016   Procedure: WIDE  EXCISION RIGHT SHOULDER MELANOMA AND WIDE EXCISION LEFT LOWER LEG MELANOMA;  Surgeon: Jackolyn Confer, MD;  Location: [facility];  Service: General;  Laterality: Right;  right shoulder and left lower leg sites  . SENTINEL NODE BIOPSY Left 05/23/2016   Procedure: LEFT INGUINAL SENTINEL LYMPH  NODE  BIOPSY;  Surgeon: Jackolyn Confer, MD;  Location: [facility];  Service: General;  Laterality: Left;  . THYROID SURGERY  07/2015   NODULE   Social History   Socioeconomic History  . Marital status: Married    Spouse name: Audiological scientist  . Number of children: 0  . Years of education: 28  . Highest education level: Not on file  Occupational History  . Occupation: Research scientist (physical sciences): [employer]  Social Needs  . Financial resource strain: Not  on file  . Food insecurity:    Worry: Not on file    Inability: Not on file  . Transportation needs:    Medical: Not on file    Non-medical: Not on file  Tobacco Use  . Smoking status: Never Smoker  . Smokeless tobacco: Never Used  Substance and Sexual Activity  . Alcohol use: No  . Drug use: No  . Sexual activity: Yes    Partners: Male    Birth control/protection: Surgical  Lifestyle  . Physical activity:    Days per week: Not on file    Minutes per session: Not on file  . Stress: Not on file  Relationships  . Social connections:    Talks on phone: Not on file    Gets together: Not on file    Attends religious service: Not on file    Active member of club or organization: Not on file    Attends meetings of clubs or organizations: Not on file    Relationship status: Not on file  . Intimate partner violence:    Fear of current or ex partner: Not on file    Emotionally abused: Not on file    Physically abused: Not on file    Forced sexual activity: Not on file  Other Topics Concern  . Not on file  Social History Narrative   Lives with her husband.   Current Outpatient Medications on File Prior to Visit  Medication Sig Dispense Refill  . calcium carbonate (OS-CAL) 600 MG TABS Take 600 mg by mouth 2 (two) times daily with a meal.      . cholecalciferol (VITAMIN D) 1000 UNITS tablet Take 5,000 Units by mouth daily.     . Cyanocobalamin (VITAMIN B-12 CR PO) Take 1 tablet by mouth daily.    Marland Kitchen estradiol (ESTRING) 2 MG vaginal ring Place 2 mg vaginally every 3 (three) months. follow package directions (Patient not taking: Reported on 12/12/2017) 1 each 3  . estradiol (VIVELLE-DOT) 0.05 MG/24HR patch Place 1 patch (0.05 mg total) onto the skin 2 (two) times a week. 24 patch 4  . levothyroxine (SYNTHROID, LEVOTHROID) 50 MCG tablet Take 0.5 tablets (25 mcg total) by mouth daily before breakfast. 45 tablet 4  . Omega-3 Fatty Acids (FISH OIL) 1200 MG CAPS Take 2 capsules by mouth  daily.     Marland Kitchen OVER THE COUNTER MEDICATION Multiple vitamin qd      No current facility-administered medications on file prior to visit.    Allergies  Allergen Reactions  . Codeine Nausea Only and Other (See Comments)    Hallucinations; confusion   Family History  Problem Relation Age of Onset  . Diabetes Brother   . Hypertension Brother   . Cancer Maternal Grandmother        COLON  . Cancer Paternal Grandfather        PROSTATE  . Hypertension Mother   . Diabetes Brother   . Heart attack Father  PE: BP 102/64   Pulse 68   Ht 5' 5.95" (1.675 m)   Wt 160 lb 6.4 oz (72.8 kg)   LMP 06/30/1990   SpO2 97%   BMI 25.93 kg/m  Wt Readings from Last 3 Encounters:  06/11/18 160 lb 6.4 oz (72.8 kg)  12/12/17 163 lb (73.9 kg)  09/18/17 160 lb (72.6 kg)   Constitutional: overweight, in NAD Eyes: PERRLA, EOMI, no exophthalmos ENT: moist mucous membranes, + lumpy bumpy thyroid on palpation, no cervical lymphadenopathy Cardiovascular: RRR, No MRG Respiratory: CTA B Gastrointestinal: abdomen soft, NT, ND, BS+ Musculoskeletal: no deformities, strength intact in all 4 Skin: moist, warm, no rashes Neurological: no tremor with outstretched hands, DTR normal in all 4  ASSESSMENT: 1. Hypothyroidism  2. Thyroid nodules  Thyroid ultrasound (12/29/2017):  Parenchymal Echotexture: Moderately heterogenous Isthmus: 7 mm, previously 9 mm Right lobe: 4.7 x 1.2 x 1.2 cm, previously 4.9 x 1.8 x 1.8 cm Left lobe: 5.1 x 2.0 x 2.2 cm, previously 5.4 x 1.9 x 1.9 cm _____________________________________________________ Nodule # 4: Prior biopsy: Yes Location: Left; Inferior Maximum size: 3.3, previously 3.2 cm; Other 2 dimensions: 1.6 x 2.2 cm, previously, 1.8 x 2.0 cm Composition: solid/almost completely solid (2) Echogenicity: isoechoic (1) Change in features: No *Given size (>/= 2.5 cm) and appearance, fine needle aspiration of this mildly suspicious nodule should be considered based on  TI-RADS criteria. Please note nodule has been previously biopsied. Correlate with prior pathology. __________________________________________________  There are additional bilateral subcentimeter cystic and hypoechoic nodules noted all measuring 8 mm or less in size. These would not meet criteria for biopsy or any follow-up. No significant interval change.  No adenopathy  IMPRESSION: 3.3 cm left inferior TR 3 nodule meets criteria for biopsy which has already been performed 08/05/2015. Correlate with prior pathology.  Otherwise stable thyroid ultrasound  PLAN:  1. Patient was initially started on levothyroxine therapy to shrink her thyroid nodules, not because her TFTs were abnormal.  It is plausible that she developed hypothyroidism afterwards, but at last visit, I advised her to decrease the dose of her levothyroxine (from 50 mcg to 25 mcg daily).  She did not do that (she forgot).  - latest thyroid labs reviewed with pt >> normal  - she continues on LT4 50 mcg daily >> will try to decrease to 25 mcg daily depending on her labs today - pt feels good on this dose. - we discussed about taking the thyroid hormone every day, with water, >30 minutes before breakfast, separated by >4 hours from acid reflux medications, calcium, iron, multivitamins. Pt. is taking it correctly. - will check thyroid tests today: TSH and fT4 - If labs are abnormal, she will need to return for repeat TFTs in 1.5 months  2.  Thyroid nodules -She has a long history of thyroid nodules, of which the dominant one in the left lobe was biopsied with benign results in 2016.  We repeated a thyroid ultrasound after last visit and this was stable.  Her dominant nodule did not increase in size and it appeared isoechoic, which is low risk for cancer. - However, she does complain of some neck compression symptoms - We again discussed the thyroidectomy is an option but she does not feel that her neck compression symptoms are  bothering her enough to desire surgery - We will continue to follow her clinically  Office Visit on 06/11/2018  Component Date Value Ref Range Status  . TSH 06/11/2018 0.36  0.35 - 4.50  uIU/mL Final  . Free T4 06/11/2018 0.90  0.60 - 1.60 ng/dL Final   Comment: Specimens from patients who are undergoing biotin therapy and /or ingesting biotin supplements may contain high levels of biotin.  The higher biotin concentration in these specimens interferes with this Free T4 assay.  Specimens that contain high levels  of biotin may cause false high results for this Free T4 assay.  Please interpret results in light of the total clinical presentation of the patient.     TSH is at the lower limit of normal.  We will go ahead with a plan to decrease the dose of her levothyroxine and hopefully stop in the near future.  I will have her come back for labs in 1.5 months.  Philemon Kingdom, MD PhD [facility] Endocrinology

## 2018-08-01 DIAGNOSIS — Z85820 Personal history of malignant melanoma of skin: Secondary | ICD-10-CM | POA: Diagnosis not present

## 2018-08-01 DIAGNOSIS — D2272 Melanocytic nevi of left lower limb, including hip: Secondary | ICD-10-CM | POA: Diagnosis not present

## 2018-08-01 DIAGNOSIS — D2262 Melanocytic nevi of left upper limb, including shoulder: Secondary | ICD-10-CM | POA: Diagnosis not present

## 2018-08-01 DIAGNOSIS — L570 Actinic keratosis: Secondary | ICD-10-CM | POA: Diagnosis not present

## 2018-08-01 DIAGNOSIS — D2261 Melanocytic nevi of right upper limb, including shoulder: Secondary | ICD-10-CM | POA: Diagnosis not present

## 2018-08-14 ENCOUNTER — Ambulatory Visit
Admission: RE | Admit: 2018-08-14 | Discharge: 2018-08-14 | Disposition: A | Discharge status: Home / Self Care | Payer: BLUE CROSS/BLUE SHIELD | Source: Ambulatory Visit | Attending: Women's Health | Admitting: Women's Health

## 2018-08-14 DIAGNOSIS — Z1231 Encounter for screening mammogram for malignant neoplasm of breast: Secondary | ICD-10-CM | POA: Diagnosis not present

## 2018-08-14 IMAGING — MG DIGITAL SCREENING BILATERAL MAMMOGRAM WITH TOMO AND CAD
6 series · 6 of 14 positions shown · non-contrast
Comparison: Previous exam(s).

CLINICAL DATA: Screening.

EXAM:
DIGITAL SCREENING BILATERAL MAMMOGRAM WITH TOMO AND CAD

[L CC synth-2D]
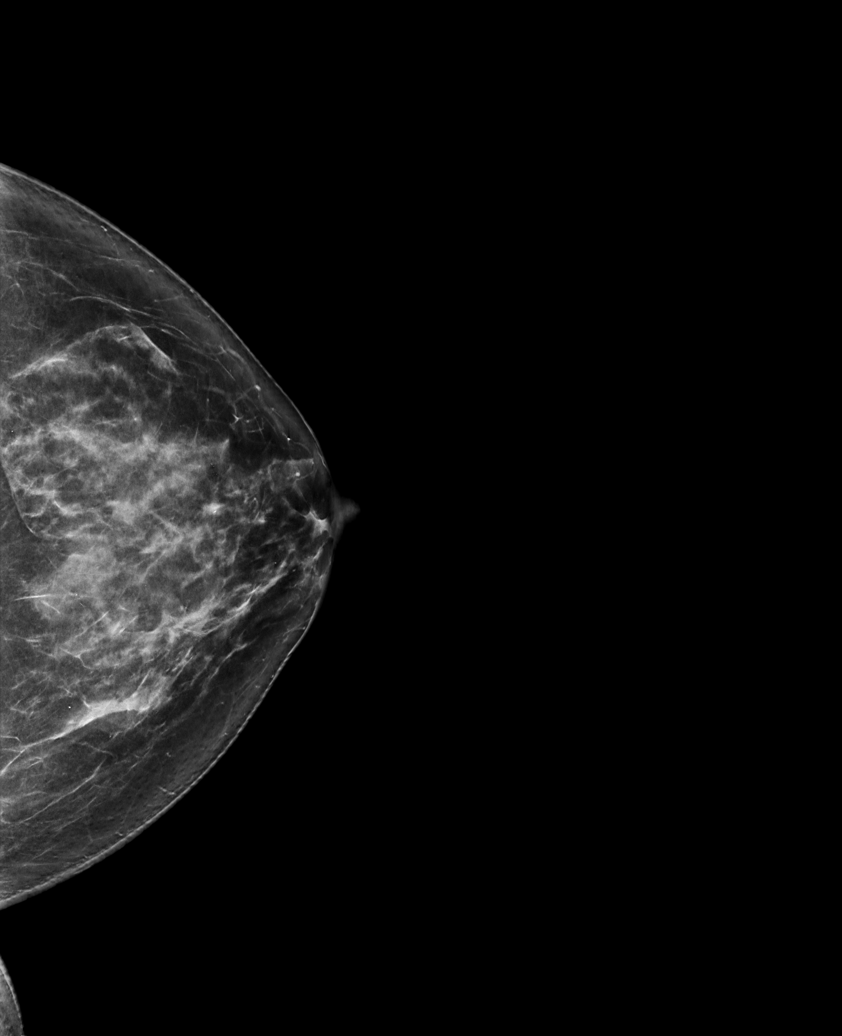

[R MLO synth-2D]
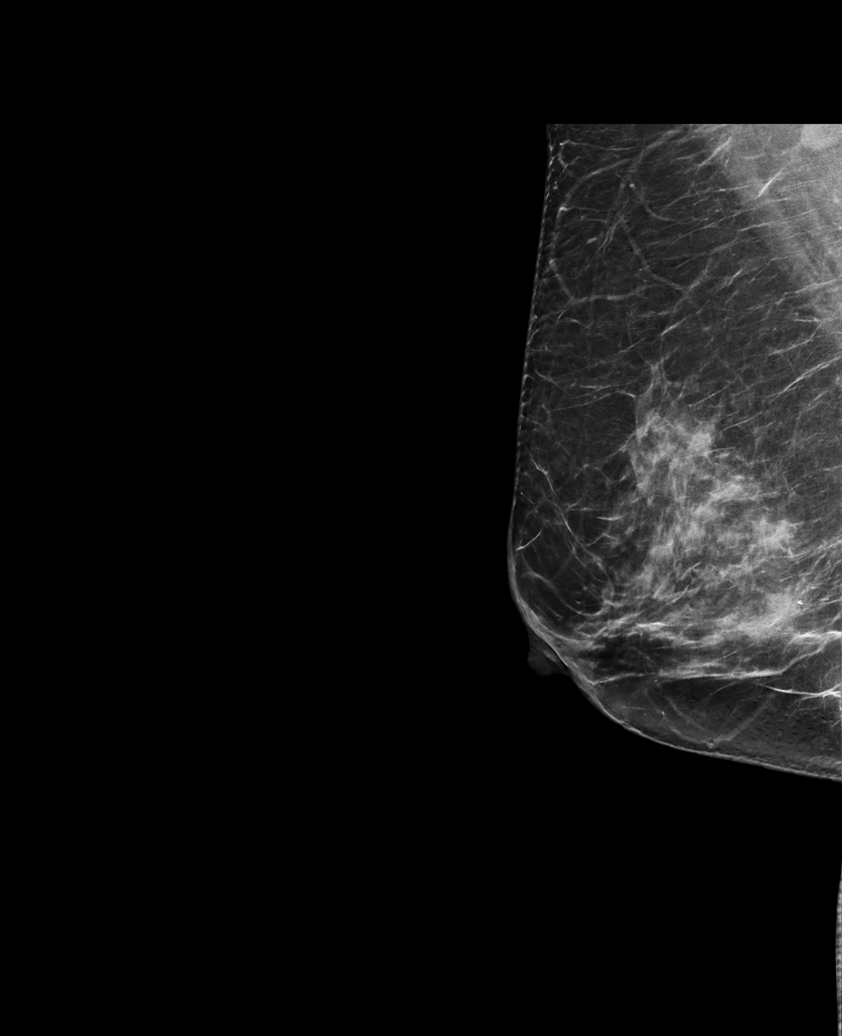

[L MLO synth-2D]
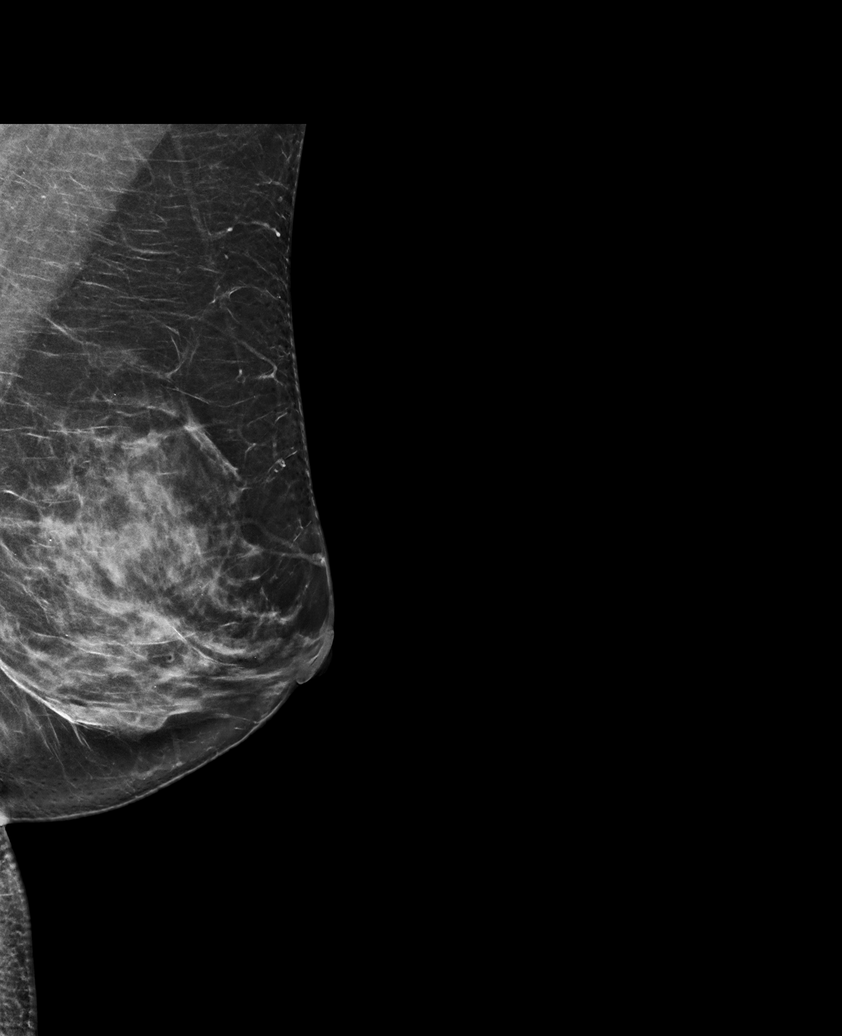

[R CC synth-2D]
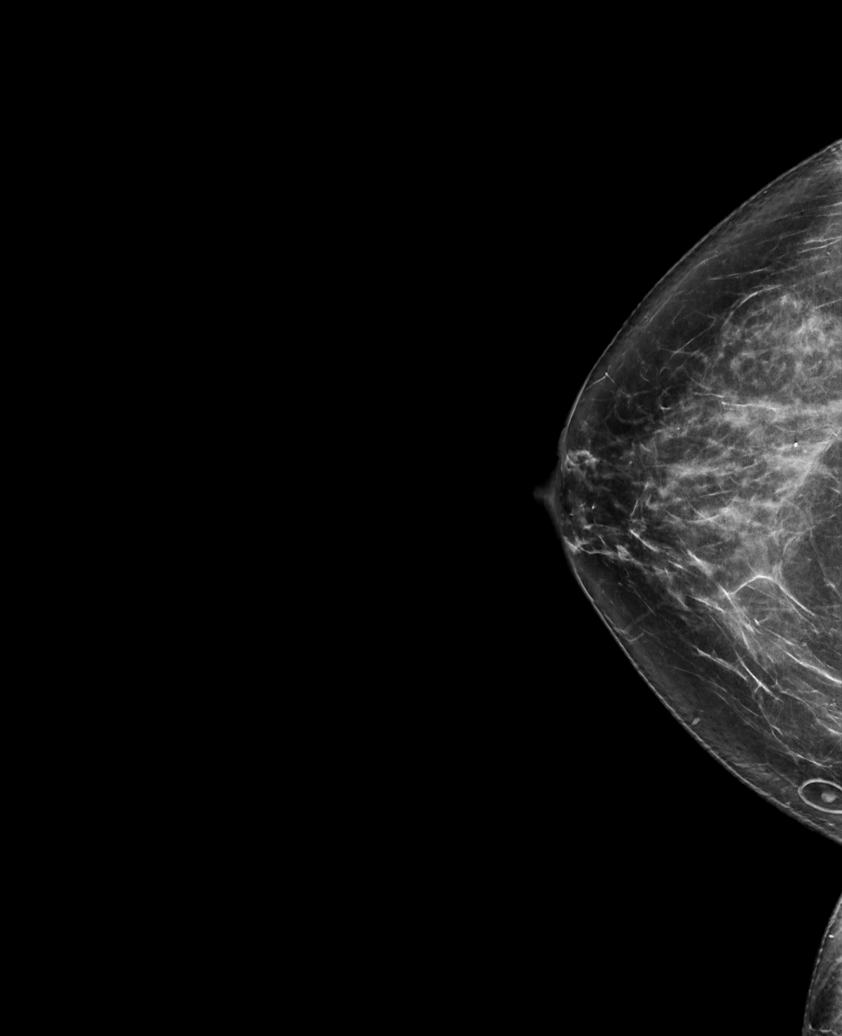

[L MLO tomo · tomo slice 41/81.0]
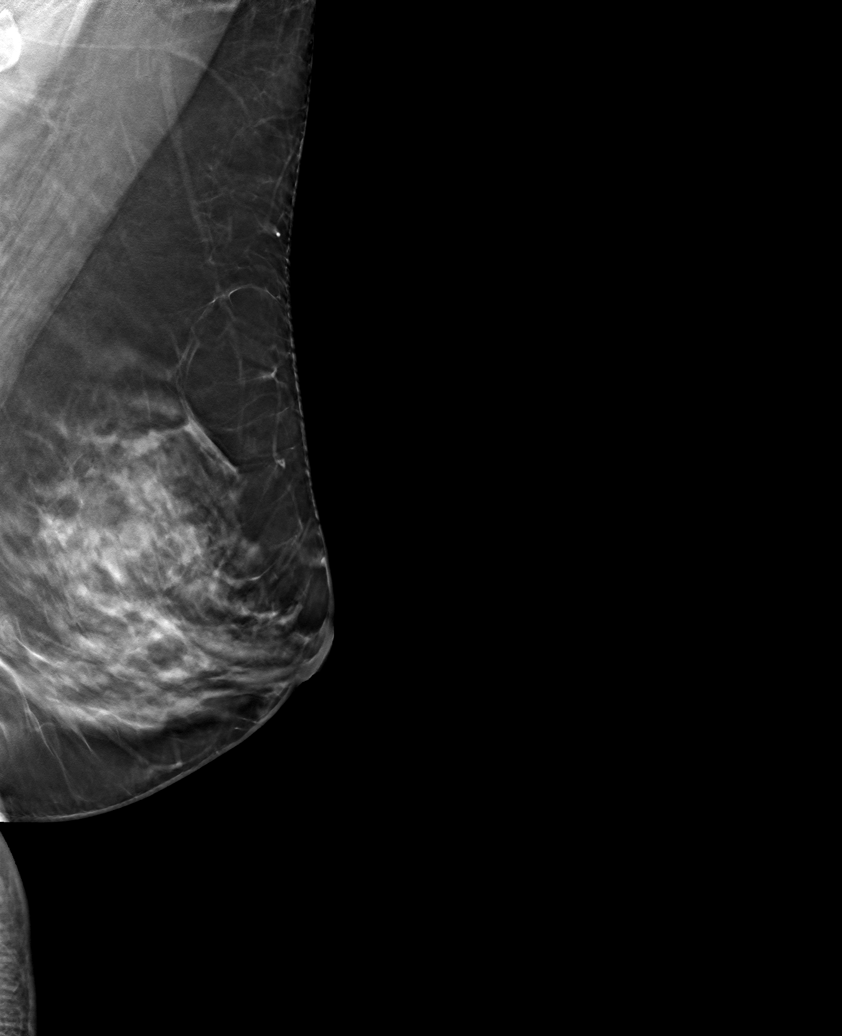

[L CC tomo · tomo slice 40/79.0]
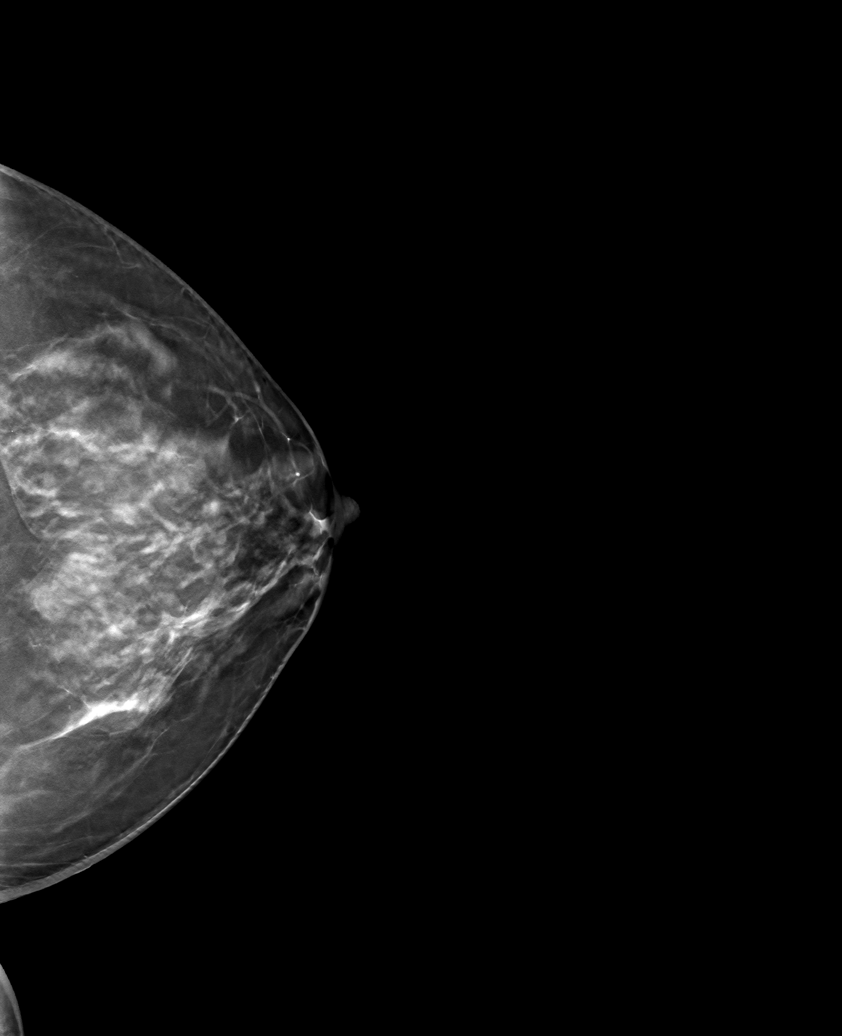

[6 of 14 positions shown; findings below may reference images not displayed]

ACR Breast Density Category c: The breast tissue is heterogeneously
dense, which may obscure small masses.
FINDINGS: There are no findings suspicious for malignancy. Images were
processed with CAD.
IMPRESSION: No mammographic evidence of malignancy. A result letter of this
screening mammogram will be mailed directly to the patient.

RECOMMENDATION:
Screening mammogram in one year. (Code:[5V])

BI-RADS CATEGORY  1: Negative.

## 2018-09-17 ENCOUNTER — Other Ambulatory Visit: Payer: Self-pay | Admitting: Women's Health

## 2018-09-17 DIAGNOSIS — Z13820 Encounter for screening for osteoporosis: Secondary | ICD-10-CM

## 2018-09-19 ENCOUNTER — Ambulatory Visit (INDEPENDENT_AMBULATORY_CARE_PROVIDER_SITE_OTHER): Payer: BLUE CROSS/BLUE SHIELD

## 2018-09-19 DIAGNOSIS — M8589 Other specified disorders of bone density and structure, multiple sites: Secondary | ICD-10-CM | POA: Diagnosis not present

## 2018-09-19 DIAGNOSIS — Z13820 Encounter for screening for osteoporosis: Secondary | ICD-10-CM | POA: Diagnosis not present

## 2018-09-21 ENCOUNTER — Other Ambulatory Visit: Payer: Self-pay | Admitting: Nurse Practitioner

## 2018-09-25 ENCOUNTER — Encounter: Payer: Self-pay | Admitting: Women's Health

## 2018-09-25 ENCOUNTER — Ambulatory Visit (INDEPENDENT_AMBULATORY_CARE_PROVIDER_SITE_OTHER): Payer: BLUE CROSS/BLUE SHIELD | Admitting: Women's Health

## 2018-09-25 VITALS — BP 120/76 | Ht 66.0 in | Wt 164.2 lb

## 2018-09-25 DIAGNOSIS — Z01419 Encounter for gynecological examination (general) (routine) without abnormal findings: Secondary | ICD-10-CM

## 2018-09-25 DIAGNOSIS — Z13220 Encounter for screening for lipoid disorders: Secondary | ICD-10-CM

## 2018-09-25 MED ORDER — ESTRADIOL 10 MCG VA TABS: ORAL_TABLET | VAGINAL | 4 refills | Status: DC

## 2018-09-25 MED ORDER — ESTRADIOL 0.5 MG PO TABS
0.5000 mg | ORAL_TABLET | Freq: Every day | ORAL | 4 refills | Status: DC
Start: 2018-09-25 — End: 2019-09-30

## 2018-09-25 NOTE — Patient Instructions (Signed)
Health Maintenance for Postmenopausal Women Menopause is a normal process in which your reproductive ability comes to an end. This process happens gradually over a span of months to years, usually between the ages of 22 and 9. Menopause is complete when you have missed 12 consecutive menstrual periods. It is important to talk with your health care provider about some of the most common conditions that affect postmenopausal women, such as heart disease, cancer, and bone loss (osteoporosis). Adopting a healthy lifestyle and getting preventive care can help to promote your health and wellness. Those actions can also lower your chances of developing some of these common conditions. What should I know about menopause? During menopause, you may experience a number of symptoms, such as:  Moderate-to-severe hot flashes.  Night sweats.  Decrease in sex drive.  Mood swings.  Headaches.  Tiredness.  Irritability.  Memory problems.  Insomnia.  Choosing to treat or not to treat menopausal changes is an individual decision that you make with your health care provider. What should I know about hormone replacement therapy and supplements? Hormone therapy products are effective for treating symptoms that are associated with menopause, such as hot flashes and night sweats. Hormone replacement carries certain risks, especially as you become older. If you are thinking about using estrogen or estrogen with progestin treatments, discuss the benefits and risks with your health care provider. What should I know about heart disease and stroke? Heart disease, heart attack, and stroke become more likely as you age. This may be due, in part, to the hormonal changes that your body experiences during menopause. These can affect how your body processes dietary fats, triglycerides, and cholesterol. Heart attack and stroke are both medical emergencies. There are many things that you can do to help prevent heart disease  and stroke:  Have your blood pressure checked at least every 1-2 years. High blood pressure causes heart disease and increases the risk of stroke.  If you are 53-22 years old, ask your health care provider if you should take aspirin to prevent a heart attack or a stroke.  Do not use any tobacco products, including cigarettes, chewing tobacco, or electronic cigarettes. If you need help quitting, ask your health care provider.  It is important to eat a healthy diet and maintain a healthy weight. ? Be sure to include plenty of vegetables, fruits, low-fat dairy products, and lean protein. ? Avoid eating foods that are high in solid fats, added sugars, or salt (sodium).  Get regular exercise. This is one of the most important things that you can do for your health. ? Try to exercise for at least 150 minutes each week. The type of exercise that you do should increase your heart rate and make you sweat. This is known as moderate-intensity exercise. ? Try to do strengthening exercises at least twice each week. Do these in addition to the moderate-intensity exercise.  Know your numbers.Ask your health care provider to check your cholesterol and your blood glucose. Continue to have your blood tested as directed by your health care provider.  What should I know about cancer screening? There are several types of cancer. Take the following steps to reduce your risk and to catch any cancer development as early as possible. Breast Cancer  Practice breast self-awareness. ? This means understanding how your breasts normally appear and feel. ? It also means doing regular breast self-exams. Let your health care provider know about any changes, no matter how small.  If you are 40  or older, have a clinician do a breast exam (clinical breast exam or CBE) every year. Depending on your age, family history, and medical history, it may be recommended that you also have a yearly breast X-ray (mammogram).  If you  have a family history of breast cancer, talk with your health care provider about genetic screening.  If you are at high risk for breast cancer, talk with your health care provider about having an MRI and a mammogram every year.  Breast cancer (BRCA) gene test is recommended for women who have family members with BRCA-related cancers. Results of the assessment will determine the need for genetic counseling and BRCA1 and for BRCA2 testing. BRCA-related cancers include these types: ? Breast. This occurs in males or females. ? Ovarian. ? Tubal. This may also be called fallopian tube cancer. ? Cancer of the abdominal or pelvic lining (peritoneal cancer). ? Prostate. ? Pancreatic.  Cervical, Uterine, and Ovarian Cancer Your health care provider may recommend that you be screened regularly for cancer of the pelvic organs. These include your ovaries, uterus, and vagina. This screening involves a pelvic exam, which includes checking for microscopic changes to the surface of your cervix (Pap test).  For women ages 21-65, health care providers may recommend a pelvic exam and a Pap test every three years. For women ages 79-65, they may recommend the Pap test and pelvic exam, combined with testing for human papilloma virus (HPV), every five years. Some types of HPV increase your risk of cervical cancer. Testing for HPV may also be done on women of any age who have unclear Pap test results.  Other health care providers may not recommend any screening for nonpregnant women who are considered low risk for pelvic cancer and have no symptoms. Ask your health care provider if a screening pelvic exam is right for you.  If you have had past treatment for cervical cancer or a condition that could lead to cancer, you need Pap tests and screening for cancer for at least 20 years after your treatment. If Pap tests have been discontinued for you, your risk factors (such as having a new sexual partner) need to be  reassessed to determine if you should start having screenings again. Some women have medical problems that increase the chance of getting cervical cancer. In these cases, your health care provider may recommend that you have screening and Pap tests more often.  If you have a family history of uterine cancer or ovarian cancer, talk with your health care provider about genetic screening.  If you have vaginal bleeding after reaching menopause, tell your health care provider.  There are currently no reliable tests available to screen for ovarian cancer.  Lung Cancer Lung cancer screening is recommended for adults 69-62 years old who are at high risk for lung cancer because of a history of smoking. A yearly low-dose CT scan of the lungs is recommended if you:  Currently smoke.  Have a history of at least 30 pack-years of smoking and you currently smoke or have quit within the past 15 years. A pack-year is smoking an average of one pack of cigarettes per day for one year.  Yearly screening should:  Continue until it has been 15 years since you quit.  Stop if you develop a health problem that would prevent you from having lung cancer treatment.  Colorectal Cancer  This type of cancer can be detected and can often be prevented.  Routine colorectal cancer screening usually begins at  age 42 and continues through age 45.  If you have risk factors for colon cancer, your health care provider may recommend that you be screened at an earlier age.  If you have a family history of colorectal cancer, talk with your health care provider about genetic screening.  Your health care provider may also recommend using home test kits to check for hidden blood in your stool.  A small camera at the end of a tube can be used to examine your colon directly (sigmoidoscopy or colonoscopy). This is done to check for the earliest forms of colorectal cancer.  Direct examination of the colon should be repeated every  5-10 years until age 71. However, if early forms of precancerous polyps or small growths are found or if you have a family history or genetic risk for colorectal cancer, you may need to be screened more often.  Skin Cancer  Check your skin from head to toe regularly.  Monitor any moles. Be sure to tell your health care provider: ? About any new moles or changes in moles, especially if there is a change in a mole's shape or color. ? If you have a mole that is larger than the size of a pencil eraser.  If any of your family members has a history of skin cancer, especially at a Quay Simkin age, talk with your health care provider about genetic screening.  Always use sunscreen. Apply sunscreen liberally and repeatedly throughout the day.  Whenever you are outside, protect yourself by wearing long sleeves, pants, a wide-brimmed hat, and sunglasses.  What should I know about osteoporosis? Osteoporosis is a condition in which bone destruction happens more quickly than new bone creation. After menopause, you may be at an increased risk for osteoporosis. To help prevent osteoporosis or the bone fractures that can happen because of osteoporosis, the following is recommended:  If you are 46-71 years old, get at least 1,000 mg of calcium and at least 600 mg of vitamin D per day.  If you are older than age 55 but younger than age 65, get at least 1,200 mg of calcium and at least 600 mg of vitamin D per day.  If you are older than age 54, get at least 1,200 mg of calcium and at least 800 mg of vitamin D per day.  Smoking and excessive alcohol intake increase the risk of osteoporosis. Eat foods that are rich in calcium and vitamin D, and do weight-bearing exercises several times each week as directed by your health care provider. What should I know about how menopause affects my mental health? Depression may occur at any age, but it is more common as you become older. Common symptoms of depression  include:  Low or sad mood.  Changes in sleep patterns.  Changes in appetite or eating patterns.  Feeling an overall lack of motivation or enjoyment of activities that you previously enjoyed.  Frequent crying spells.  Talk with your health care provider if you think that you are experiencing depression. What should I know about immunizations? It is important that you get and maintain your immunizations. These include:  Tetanus, diphtheria, and pertussis (Tdap) booster vaccine.  Influenza every year before the flu season begins.  Pneumonia vaccine.  Shingles vaccine.  Your health care provider may also recommend other immunizations. This information is not intended to replace advice given to you by your health care provider. Make sure you discuss any questions you have with your health care provider. Document Released: 11/24/2005  Document Revised: 04/21/2016 Document Reviewed: 07/06/2015 Elsevier Interactive Patient Education  2018 [employer]  

## 2018-09-25 NOTE — Progress Notes (Signed)
Sharon Kirk 1961-05-02 [id number]    History:    Presents for annual exam.  1991 TVH for menorrhagia on Vivelle patch.  Normal Pap and mammogram history. Was using Estring but states was falling out and stopped using.  2013 normal DEXA had a repeat last week results not in the chart. Negative colonoscopy at age 57.  Melanoma diagnosed 2017 has every 21-month skin checks.  Hypothyroid endocrinologist manages.  Continues to have some problems with stool incontinence, has seen integrative therapies minimal relief.  Past medical history, past surgical history, family history and social history were all reviewed and documented in the EPIC chart.  Works at [employer].  Starting to work with a Physiological scientist January 1 through a work program.  ROS:  A ROS was performed and pertinent positives and negatives are included.  Exam:  Vitals:   09/25/18 0820  BP: 120/76  Weight: 164 lb 3.2 oz (74.5 kg)  Height: 5\' 6"  (1.676 m)   Body mass index is 26.5 kg/m.   General appearance:  Normal Thyroid:  Symmetrical, normal in size, without palpable masses or nodularity. Respiratory  Auscultation:  Clear without wheezing or rhonchi Cardiovascular  Auscultation:  Regular rate, without rubs, murmurs or gallops  Edema/varicosities:  Not grossly evident Abdominal  Soft,nontender, without masses, guarding or rebound.  Liver/spleen:  No organomegaly noted  Hernia:  None appreciated  Skin  Inspection:  Grossly normal   Breasts: Examined lying and sitting.     Right: Without masses, retractions, discharge or axillary adenopathy.     Left: Without masses, retractions, discharge or axillary adenopathy. Gentitourinary   Inguinal/mons:  Normal without inguinal adenopathy  External genitalia:  Normal  BUS/Urethra/Skene's glands:  Normal  Vagina:  Normal  Cervix: And uterus absent adnexa/parametria:     Rt: Without masses or tenderness.   Lt: Without masses or tenderness.  Anus and perineum: Normal  Digital  rectal exam: Normal sphincter tone without palpated masses or tenderness  Assessment/Plan:  57 y.o. MWF G0 for annual exam with occasional stool incontinence.  54 TVH for menorrhagia on HRT IBS/stool incontinence-Dr. Hung 2017 melanoma-dermatologist manages Hypothyroidism- endocrinology manages.  Plan: Cost of Vivelle patch over $100, will try Estrace 0.5 mg daily, reviewed best to use shortest amount of time, risks of blood clots, strokes and breast cancer reviewed.  Vagifem 1 applicator at bedtime for 2 weeks and then twice weekly prescription, proper use given and reviewed.  Reviewed may help with incontinence.  PEs, continue annual screening mammogram, calcium rich foods, vitamin D 2000 daily encouraged.  Long discussion on supplements.  Magnesium and potassium supplement, reviewed magnesium often works as a laxative.  Repeat colonoscopy next year, reviewed Shingrex at 79.  CBC, CMP, lipid panel.    Huel Cote [facility], 8:41 AM 09/25/2018

## 2018-09-26 LAB — CBC WITH DIFFERENTIAL/PLATELET
Basophils Absolute: 30 cells/uL (ref 0–200)
Basophils Relative: 0.8 %
Eosinophils Absolute: 89 cells/uL (ref 15–500)
Eosinophils Relative: 2.4 %
HCT: 41.8 % (ref 35.0–45.0)
Hemoglobin: 14 g/dL (ref 11.7–15.5)
Lymphs Abs: 1240 cells/uL (ref 850–3900)
MCH: 28.4 pg (ref 27.0–33.0)
MCHC: 33.5 g/dL (ref 32.0–36.0)
MCV: 84.8 fL (ref 80.0–100.0)
MPV: 10.1 fL (ref 7.5–12.5)
Monocytes Relative: 7.8 %
Neutro Abs: 2054 cells/uL (ref 1500–7800)
Neutrophils Relative %: 55.5 %
Platelets: 250 10*3/uL (ref 140–400)
RBC: 4.93 10*6/uL (ref 3.80–5.10)
RDW: 13 % (ref 11.0–15.0)
Total Lymphocyte: 33.5 %
WBC mixed population: 289 cells/uL (ref 200–950)
WBC: 3.7 10*3/uL — ABNORMAL LOW (ref 3.8–10.8)

## 2018-09-26 LAB — COMPREHENSIVE METABOLIC PANEL
AG Ratio: 2 (calc) (ref 1.0–2.5)
ALT: 14 U/L (ref 6–29)
AST: 14 U/L (ref 10–35)
Albumin: 4.3 g/dL (ref 3.6–5.1)
Alkaline phosphatase (APISO): 47 U/L (ref 33–130)
BUN: 11 mg/dL (ref 7–25)
CO2: 28 mmol/L (ref 20–32)
Calcium: 9.4 mg/dL (ref 8.6–10.4)
Chloride: 105 mmol/L (ref 98–110)
Creat: 0.57 mg/dL (ref 0.50–1.05)
Globulin: 2.1 g/dL (calc) (ref 1.9–3.7)
Glucose, Bld: 89 mg/dL (ref 65–99)
Potassium: 4.6 mmol/L (ref 3.5–5.3)
Sodium: 141 mmol/L (ref 135–146)
Total Bilirubin: 0.5 mg/dL (ref 0.2–1.2)
Total Protein: 6.4 g/dL (ref 6.1–8.1)

## 2018-09-26 LAB — LIPID PANEL
Cholesterol: 211 mg/dL — ABNORMAL HIGH (ref <200)
HDL: 51 mg/dL (ref >50)
LDL Cholesterol (Calc): 135 mg/dL (calc) — ABNORMAL HIGH
Non-HDL Cholesterol (Calc): 160 mg/dL (calc) — ABNORMAL HIGH (ref <130)
Total CHOL/HDL Ratio: 4.1 (calc) (ref <5.0)
Triglycerides: 127 mg/dL (ref <150)

## 2018-09-30 ENCOUNTER — Other Ambulatory Visit: Payer: Self-pay | Admitting: Obstetrics & Gynecology

## 2018-09-30 DIAGNOSIS — Z13820 Encounter for screening for osteoporosis: Secondary | ICD-10-CM

## 2018-10-03 ENCOUNTER — Other Ambulatory Visit: Payer: Self-pay | Admitting: Obstetrics & Gynecology

## 2018-10-03 DIAGNOSIS — Z13820 Encounter for screening for osteoporosis: Secondary | ICD-10-CM

## 2018-10-03 DIAGNOSIS — M8589 Other specified disorders of bone density and structure, multiple sites: Secondary | ICD-10-CM

## 2018-10-10 ENCOUNTER — Encounter: Payer: Self-pay | Admitting: Internal Medicine

## 2018-10-10 ENCOUNTER — Other Ambulatory Visit: Payer: Self-pay

## 2018-10-10 ENCOUNTER — Telehealth: Payer: Self-pay | Admitting: Internal Medicine

## 2018-10-10 MED ORDER — LEVOTHYROXINE SODIUM 50 MCG PO TABS: 25.0000 ug | ORAL_TABLET | Freq: Every day | ORAL | 4 refills | Status: DC

## 2018-10-10 NOTE — Telephone Encounter (Signed)
This has already been ordered.

## 2018-10-10 NOTE — Telephone Encounter (Signed)
MEDICATION: Levothyroxine  PHARMACY:  [facility] on [address]  IS THIS A 90 DAY SUPPLY : Yes  IS PATIENT OUT OF MEDICATION: No-almost  IF NOT; HOW MUCH IS LEFT: 3 days  LAST APPOINTMENT DATE: @12 /26/2019  NEXT APPOINTMENT DATE:@Visit  date not found  DO WE HAVE YOUR PERMISSION TO LEAVE A DETAILED MESSAGE: Yes  OTHER COMMENTS:    **Let patient know to contact pharmacy at the end of the day to make sure medication is ready. **  ** Please notify patient to allow 48-72 hours to process**  **Encourage patient to contact the pharmacy for refills or they can request refills through [facility]**

## 2018-10-14 ENCOUNTER — Other Ambulatory Visit: Payer: Self-pay

## 2018-10-14 ENCOUNTER — Telehealth: Payer: Self-pay | Admitting: Internal Medicine

## 2018-10-14 MED ORDER — LEVOTHYROXINE SODIUM 50 MCG PO TABS
25.0000 ug | ORAL_TABLET | Freq: Every day | ORAL | 4 refills | Status: DC
Start: 2018-10-14 — End: 2019-08-14

## 2018-10-14 NOTE — Telephone Encounter (Signed)
levothyroxine (SYNTHROID, LEVOTHROID) 50 MCG tablet  Patient stated the pharmacy has not received this medication. And would like to resent .      [id number] - [place], [place]

## 2018-10-14 NOTE — Telephone Encounter (Signed)
This has been resent-first sent on 10/10/18

## 2019-02-13 DIAGNOSIS — D485 Neoplasm of uncertain behavior of skin: Secondary | ICD-10-CM | POA: Diagnosis not present

## 2019-02-13 DIAGNOSIS — D2261 Melanocytic nevi of right upper limb, including shoulder: Secondary | ICD-10-CM | POA: Diagnosis not present

## 2019-02-13 DIAGNOSIS — D225 Melanocytic nevi of trunk: Secondary | ICD-10-CM | POA: Diagnosis not present

## 2019-02-13 DIAGNOSIS — Z85820 Personal history of malignant melanoma of skin: Secondary | ICD-10-CM | POA: Diagnosis not present

## 2019-02-13 DIAGNOSIS — D2262 Melanocytic nevi of left upper limb, including shoulder: Secondary | ICD-10-CM | POA: Diagnosis not present

## 2019-03-11 DIAGNOSIS — D485 Neoplasm of uncertain behavior of skin: Secondary | ICD-10-CM | POA: Diagnosis not present

## 2019-03-11 DIAGNOSIS — D225 Melanocytic nevi of trunk: Secondary | ICD-10-CM | POA: Diagnosis not present

## 2019-04-30 DIAGNOSIS — R159 Full incontinence of feces: Secondary | ICD-10-CM | POA: Diagnosis not present

## 2019-04-30 DIAGNOSIS — Z1211 Encounter for screening for malignant neoplasm of colon: Secondary | ICD-10-CM | POA: Diagnosis not present

## 2019-05-20 DIAGNOSIS — K635 Polyp of colon: Secondary | ICD-10-CM | POA: Diagnosis not present

## 2019-05-20 DIAGNOSIS — Z1211 Encounter for screening for malignant neoplasm of colon: Secondary | ICD-10-CM | POA: Diagnosis not present

## 2019-05-20 DIAGNOSIS — D122 Benign neoplasm of ascending colon: Secondary | ICD-10-CM | POA: Diagnosis not present

## 2019-05-20 DIAGNOSIS — K5730 Diverticulosis of large intestine without perforation or abscess without bleeding: Secondary | ICD-10-CM | POA: Diagnosis not present

## 2019-06-10 ENCOUNTER — Other Ambulatory Visit: Payer: Self-pay

## 2019-06-12 ENCOUNTER — Ambulatory Visit (INDEPENDENT_AMBULATORY_CARE_PROVIDER_SITE_OTHER): Payer: BC Managed Care – PPO | Admitting: Internal Medicine

## 2019-06-12 ENCOUNTER — Other Ambulatory Visit: Payer: Self-pay

## 2019-06-12 ENCOUNTER — Encounter: Payer: Self-pay | Admitting: Internal Medicine

## 2019-06-12 VITALS — BP 112/60 | HR 62 | Ht 65.75 in | Wt 158.0 lb

## 2019-06-12 DIAGNOSIS — E042 Nontoxic multinodular goiter: Secondary | ICD-10-CM | POA: Diagnosis not present

## 2019-06-12 DIAGNOSIS — E039 Hypothyroidism, unspecified: Secondary | ICD-10-CM | POA: Diagnosis not present

## 2019-06-12 LAB — T4, FREE: Free T4: 1.13 ng/dL (ref 0.60–1.60)

## 2019-06-12 LAB — TSH: TSH: 0.48 u[IU]/mL (ref 0.35–4.50)

## 2019-06-12 NOTE — Patient Instructions (Signed)
Please continue Levothyroxine 25 mcg daily.  Take the thyroid hormone every day, with water, at least 30 minutes before breakfast, separated by at least 4 hours from: - acid reflux medications - calcium - iron - multivitamins  Please stop at the lab.  Please come back for a follow-up appointment in 1 year.

## 2019-06-12 NOTE — Progress Notes (Addendum)
Patient ID: Sharon Kirk, female   DOB: 21-Nov-1960, 58 y.o.   MRN: [id number]    HPI  Sharon Kirk is a 57 y.o.-year-old female, initially referred by her PCP, Elon Alas, NP , returning for follow-up for hypothyroidism and thyroid nodules.  She previously saw Dr. Jeanann Lewandowsky, who retired.  Last visit with me was a year ago.  Reviewed history: Thyroid nodule biopsy in 06/2010, but I do not have access to review these results (in Dr. Carlis Abbott 's office).  Thyroid ultrasound (05/18/2015): She had a 3.2 cm left thyroid nodule and other smaller thyroid nodules scattered throughout her thyroid.  08/05/2015: FNA of the 3.2 cm thyroid nodule >> benign  Thyroid ultrasound (12/29/2017): Stable 3.3 x 1.6 x 2.2 cm isoechoic left thyroid nodule and stable, smaller, thyroid nodules scattered throughout her thyroid  In 2014 she was started on levothyroxine 50 mcg daily to shrink the thyroid nodules.  She remained on levothyroxine afterwards.  2 years ago, I advised her to decrease the dose of levothyroxine to 25 mcg daily but she forgot to do so and continued on 50 mcg daily.  At last visit, I again advised her to decrease the dose to 25 mcg daily and she did this for a while but after she refilled her levothyroxine, she went back to taking the whole tablet of 50 mcg daily.  Moreover, she did not return for labs in 1.5 months, as advised.  Pt is on levothyroxine 50 mcg daily, taken: - in am - fasting - at least 30 min from b'fast - no Fe, MVI, PPIs - not on Biotin - + Calcium and vitamin D at night  Reviewed patient's TFTs: Lab Results  Component Value Date   TSH 0.36 06/11/2018   TSH 0.53 12/12/2017   TSH 0.570 08/25/2016   TSH 0.990 08/14/2012   FREET4 0.90 06/11/2018   FREET4 0.83 12/12/2017   08/2017: normal, reportedly  Pt denies: - feeling nodules in neck - hoarseness - choking - SOB with lying down She has chronic dysphagia and cough.  Barium swallow in 2011>> nonspecific  esophageal motility disorder  She has no FH of thyroid disorders. No FH of thyroid cancer. No h/o radiation tx to head or neck.  No herbal supplements. No Biotin use. No recent steroids use.   Pt. also has a history of metastatic melanoma in LNs.  ROS: Constitutional: no weight gain/no weight loss, no fatigue, no subjective hyperthermia, no subjective hypothermia Eyes: no blurry vision, no xerophthalmia ENT: no sore throat, + see HPI Cardiovascular: no CP/no SOB/no palpitations/no leg swelling Respiratory: no cough/no SOB/no wheezing Gastrointestinal: no N/no V/no D/no C/no acid reflux Musculoskeletal: no muscle aches/no joint aches Skin: no rashes, no hair loss Neurological: no tremors/no numbness/no tingling/no dizziness  I reviewed pt's medications, allergies, PMH, social hx, family hx, and changes were documented in the history of present illness. Otherwise, unchanged from my initial visit note.  Past Medical History:  Diagnosis Date  . Allergy   . Cancer ([place])    Melanoma  . Hyperlipidemia   . IBS (irritable bowel syndrome)   . Osteopenia   . PONV (postoperative nausea and vomiting)   . Thyroid nodule    Past Surgical History:  Procedure Laterality Date  . ABDOMINAL HYSTERECTOMY  1991  . DILATION AND CURETTAGE OF UTERUS  1988  . MELANOMA EXCISION Right 05/23/2016   Procedure: WIDE  EXCISION RIGHT SHOULDER MELANOMA AND WIDE EXCISION LEFT LOWER LEG MELANOMA;  Surgeon: Jackolyn Confer,  MD;  Location: [facility];  Service: General;  Laterality: Right;  right shoulder and left lower leg sites  . SENTINEL NODE BIOPSY Left 05/23/2016   Procedure: LEFT INGUINAL SENTINEL LYMPH  NODE BIOPSY;  Surgeon: Jackolyn Confer, MD;  Location: [facility];  Service: General;  Laterality: Left;  . THYROID SURGERY  07/2015   NODULE   Social History   Socioeconomic History  . Marital status: Married    Spouse name: Audiological scientist  . Number of children: 0  . Years of  education: 81  . Highest education level: Not on file  Occupational History  . Occupation: Research scientist (physical sciences): [employer]  Social Needs  . Financial resource strain: Not on file  . Food insecurity    Worry: Not on file    Inability: Not on file  . Transportation needs    Medical: Not on file    Non-medical: Not on file  Tobacco Use  . Smoking status: Never Smoker  . Smokeless tobacco: Never Used  Substance and Sexual Activity  . Alcohol use: No  . Drug use: No  . Sexual activity: Yes    Partners: Male    Birth control/protection: Surgical  Lifestyle  . Physical activity    Days per week: Not on file    Minutes per session: Not on file  . Stress: Not on file  Relationships  . Social Herbalist on phone: Not on file    Gets together: Not on file    Attends religious service: Not on file    Active member of club or organization: Not on file    Attends meetings of clubs or organizations: Not on file    Relationship status: Not on file  . Intimate partner violence    Fear of current or ex partner: Not on file    Emotionally abused: Not on file    Physically abused: Not on file    Forced sexual activity: Not on file  Other Topics Concern  . Not on file  Social History Narrative   Lives with her husband.   Current Outpatient Medications on File Prior to Visit  Medication Sig Dispense Refill  . calcium carbonate (OS-CAL) 600 MG TABS Take 600 mg by mouth 2 (two) times daily with a meal.      . cholecalciferol (VITAMIN D) 1000 UNITS tablet Take 5,000 Units by mouth daily.     . Cyanocobalamin (VITAMIN B-12 CR PO) Take 1 tablet by mouth daily.    Marland Kitchen estradiol (ESTRACE) 0.5 MG tablet Take 1 tablet (0.5 mg total) by mouth daily. 90 tablet 4  . Estradiol 10 MCG TABS vaginal tablet Place in vagina daily for 2 weeks then twice weekly 24 tablet 4  . levothyroxine (SYNTHROID, LEVOTHROID) 50 MCG tablet Take 0.5 tablets (25 mcg total) by mouth daily before  breakfast. 45 tablet 4  . Omega-3 Fatty Acids (FISH OIL) 1200 MG CAPS Take 2 capsules by mouth daily.     Marland Kitchen OVER THE COUNTER MEDICATION Multiple vitamin qd      No current facility-administered medications on file prior to visit.    Allergies  Allergen Reactions  . Codeine Nausea Only and Other (See Comments)    Hallucinations; confusion   Family History  Problem Relation Age of Onset  . Diabetes Brother   . Hypertension Brother   . Cancer Maternal Grandmother        COLON  . Cancer  Paternal Grandfather        PROSTATE  . Hypertension Mother   . Diabetes Brother   . Heart attack Father     PE: BP 112/60   Pulse 62   Ht 5' 5.75" (1.67 m) Comment: measured today without shoes  Wt 158 lb (71.7 kg)   LMP 06/30/1990   SpO2 97%   BMI 25.70 kg/m  Wt Readings from Last 3 Encounters:  06/12/19 158 lb (71.7 kg)  09/25/18 164 lb 3.2 oz (74.5 kg)  06/11/18 160 lb 6.4 oz (72.8 kg)   Constitutional: overweight, in NAD Eyes: PERRLA, EOMI, no exophthalmos ENT: moist mucous membranes, + lumpy bumpy thyroid on palpation, no cervical lymphadenopathy Cardiovascular: RRR, No MRG Respiratory: CTA B Gastrointestinal: abdomen soft, NT, ND, BS+ Musculoskeletal: no deformities, strength intact in all 4 Skin: moist, warm, no rashes Neurological: no tremor with outstretched hands, DTR normal in all 4  ASSESSMENT: 1. Hypothyroidism  2. Thyroid nodules  Thyroid ultrasound (12/29/2017):  Parenchymal Echotexture: Moderately heterogenous Isthmus: 7 mm, previously 9 mm Right lobe: 4.7 x 1.2 x 1.2 cm, previously 4.9 x 1.8 x 1.8 cm Left lobe: 5.1 x 2.0 x 2.2 cm, previously 5.4 x 1.9 x 1.9 cm _____________________________________________________ Nodule # 4: Prior biopsy: Yes Location: Left; Inferior Maximum size: 3.3, previously 3.2 cm; Other 2 dimensions: 1.6 x 2.2 cm, previously, 1.8 x 2.0 cm Composition: solid/almost completely solid (2) Echogenicity: isoechoic (1) Change in features:  No *Given size (>/= 2.5 cm) and appearance, fine needle aspiration of this mildly suspicious nodule should be considered based on TI-RADS criteria. Please note nodule has been previously biopsied. Correlate with prior pathology. __________________________________________________  There are additional bilateral subcentimeter cystic and hypoechoic nodules noted all measuring 8 mm or less in size. These would not meet criteria for biopsy or any follow-up. No significant interval change.  No adenopathy  IMPRESSION: 3.3 cm left inferior TR 3 nodule meets criteria for biopsy which has already been performed 08/05/2015. Correlate with prior pathology.  Otherwise stable thyroid ultrasound  PLAN:  1. Patient was initially started on levothyroxine therapy to shrink her thyroid nodules, not because her TFTs were abnormal.  She developed mild hypothyroidism afterwards and I repeatedly advised him to decrease her levothyroxine dose to 25 and then possibly even stop but she was not compliant with instructions and with her lab appointment.  Therefore, she is currently still on 50 mcg daily... She feels good on this dose, without complaints - latest thyroid labs reviewed with pt >> normal: Lab Results  Component Value Date   TSH 0.36 06/11/2018  - we discussed about taking the thyroid hormone every day, with water, >30 minutes before breakfast, separated by >4 hours from acid reflux medications, calcium, iron, multivitamins. Pt. is taking it correctly. - will check thyroid tests today: TSH and fT4.  If normal, I will again advise her to decrease the dose to 25 mcg daily. - If labs are abnormal, she will need to return for repeat TFTs in 1.5 months - At this visit, we discussed that she needs to pay more attention to the instructions about levothyroxine dosing and also to trying to come for labs as advised.  She agrees to do so.  2.  Thyroid nodules -She has a long history of thyroid nodules, of which  the dominant one in the left lobe was biopsied with benign results in 2016.  We repeated her thyroid ultrasound in 2019 and the nodule was stable.  It appeared isoechoic,  which compresses the lower risk for cancer. -At last visit she mentioned mild neck compression symptoms.  We discussed about thyroidectomy as an option but she did not feel that her neck compression symptoms were bothering her enough to desire surgery.  We decided to follow her clinically. -At this visit, she still complains of hoarseness later in the day and also dysphagia.  These are not severe enough to require surgery, but she would like to have another ultrasound check to see if the nodules increased. -I ordered the new ultrasound today.  We did discuss about the possibility of thyroid lobectomy if the symptoms become more bothersome.  Orders Placed This Encounter  Procedures  . US THYROID  . TSH  . T4, free   Component     Latest Ref Rng & Units 06/12/2019  TSH     0.35 - 4.50 uIU/mL 0.48  T4,Free(Direct)     0.60 - 1.60 ng/dL 1.13   TSH is low in the normal range.  We will advised her to decrease the dose to 25 mcg levothyroxine daily and repeat the test in 1.5 months.  We will call her with instructions.  CLINICAL DATA:  Prior ultrasound follow-up. Follow-up thyroid nodules. History of left-sided thyroid nodule fine-needle aspiration performed 08/05/2015  EXAM: THYROID ULTRASOUND  TECHNIQUE: Ultrasound examination of the thyroid gland and adjacent soft tissues was performed.  COMPARISON:  12/28/2017; 01/22/2013; ultrasound-guided left thyroid nodule fine-needle aspiration-08/05/2015  FINDINGS: Parenchymal Echotexture: Moderately heterogenous  Isthmus: Normal in size measuring 0.7 cm in diameter  Right lobe: Normal in size measuring 3.6 x 1.2 x 1.4 cm, unchanged, previously, 4.7 x 1.2 x 1.2 cm  Left lobe: Borderline enlarged measuring 5.1 x 1.8 x 2.9 cm, unchanged, previously, 5.1 x 2.0 x 2.2  cm  _________________________________________________________  Estimated total number of nodules >/= 1 cm: 2  Number of spongiform nodules >/=  2 cm not described below (TR1): 0  Number of mixed cystic and solid nodules >/= 1.5 cm not described below (TR2): 0  _________________________________________________________  The approximately 1.2 x 1.1 x 0.5 cm hypoechoic nodule within the thyroid isthmus (labeled 1) is unchanged compared to the 01/2013 examination, previously, 1.1 x 1.1 x 0.6 cm. Stability for greater than 5 years is indicative of benign etiology.  _________________________________________________________  The approximately 0.9 x 0.9 x 0.4 cm hypoechoic nodule within mid aspect the right lobe of the thyroid (labeled 2) is unchanged to decreased in size compared to the 07/2015 examination, previously, 1.6 x 1.1 x 0.9 cm. Stability for greater than 5 years is indicative of benign etiology.  The approximately 0.9 x 0.8 x 0.4 cm isoechoic ill-defined nodule/pseudonodule within the inferior pole of the left lobe of the thyroid (labeled 3) is unchanged compared to the 01/2013 examination, previously, 1.2 x 1.0 x 0.7 cm. Stability for greater than 5 years is indicative of benign etiology.  _________________________________________________________  The previously biopsied approximately 3.4 x 2.9 x 1.8 cm ill-defined hypoechoic nodule/pseudonodule replacing the mid and inferior aspects of the left lobe of the thyroid (labeled 4) are grossly unchanged compared to the 01/2013 examination, previously, 3.3 x 2.3 x 1.8 cm, with size differences likely attributable to scan plane projection.  IMPRESSION: 1. Similar findings of multinodular goiter. No worrisome new or enlarging thyroid nodules. 2. Previously biopsied nodule within the left lobe of the thyroid (labeled 4), is grossly unchanged compared to the 01/2013 examination. Correlation with prior biopsy  results is recommended. Assuming a benign pathologic diagnosis, repeat sampling  and/or continued dedicated follow-up is not recommended. 3. The remaining thyroid nodules are unchanged since the 01/2013 examination. Stability for greater than 5 years is indicative of benign etiology and as such, repeat sampling and/or continued dedicated follow-up is not recommended.  The above is in keeping with the ACR TI-RADS recommendations - J Am Coll Radiol 2017;14:587-595.   Electronically Signed   By: Sandi Mariscal M.D.   On: 06/21/2019 09:27    Stable thyroid nodules.  07/04/2019: Barium swallow: CLINICAL DATA:  Dysphagia with episodes of choking and pills sticking in the throat. Thyroid nodules.  EXAM: ESOPHOGRAM / BARIUM SWALLOW / BARIUM TABLET STUDY  TECHNIQUE: Combined double contrast and single contrast examination performed using effervescent crystals, thick barium liquid, and thin barium liquid. The patient was observed with fluoroscopy swallowing a 13 mm barium sulphate tablet.  FLUOROSCOPY TIME:  Fluoroscopy Time:  2 minutes 12 seconds  Radiation Exposure Index (if provided by the fluoroscopic device): 60 mg  Number of Acquired Spot Images: 9  COMPARISON:  02/11/2010 esophagram.  FINDINGS: Normal oral and pharyngeal phases of swallowing, with no laryngeal penetration or tracheobronchial aspiration. No significant barium retention in the pharynx. No evidence of pharyngeal mass, stricture or diverticulum. No evidence of cricopharyngeus muscle dysfunction.  There is mild esophageal dysmotility, characterized proximal escape of the barium bolus in the upper thoracic esophagus and by mild tertiary contractions. No hiatal hernia. No gastroesophageal reflux elicited, despite provocative maneuvers including Valsalva maneuver and water siphon test. Normal esophageal mucosa, with no evidence of reflux esophagitis. Normal esophageal distensibility, with no evidence  of esophageal mass, stricture or ulcer. Barium tablet traversed the esophagus into the stomach without delay.  IMPRESSION: 1. Mild esophageal dysmotility with presbyesophagus pattern. 2. Otherwise normal esophagram. No hiatal hernia. No gastroesophageal reflux elicited. No evidence of esophageal mass, stricture or ulcer.  Philemon Kingdom, MD PhD [facility] Endocrinology

## 2019-06-19 ENCOUNTER — Ambulatory Visit
Admission: RE | Admit: 2019-06-19 | Discharge: 2019-06-19 | Disposition: A | Discharge status: Home / Self Care | Payer: BC Managed Care – PPO | Source: Ambulatory Visit | Attending: Internal Medicine | Admitting: Internal Medicine

## 2019-06-19 ENCOUNTER — Other Ambulatory Visit: Payer: Self-pay | Admitting: Women's Health

## 2019-06-19 DIAGNOSIS — Z1231 Encounter for screening mammogram for malignant neoplasm of breast: Secondary | ICD-10-CM

## 2019-06-19 DIAGNOSIS — E042 Nontoxic multinodular goiter: Secondary | ICD-10-CM

## 2019-06-19 IMAGING — US US THYROID
1 series · 12 of 25 positions shown · non-contrast
Comparison: [DATE]; [DATE]; ultrasound-guided left thyroid
nodule fine-needle aspiration-[DATE]

CLINICAL DATA: Prior ultrasound follow-up. Follow-up thyroid
nodules. History of left-sided thyroid nodule fine-needle aspiration
performed [DATE]

EXAM:
THYROID ULTRASOUND
TECHNIQUE: Ultrasound examination of the thyroid gland and adjacent soft
tissues was performed.

[Series 1: us thyroid · 0.08mm/px · 12 of 65 slices shown]
[im 3/65]
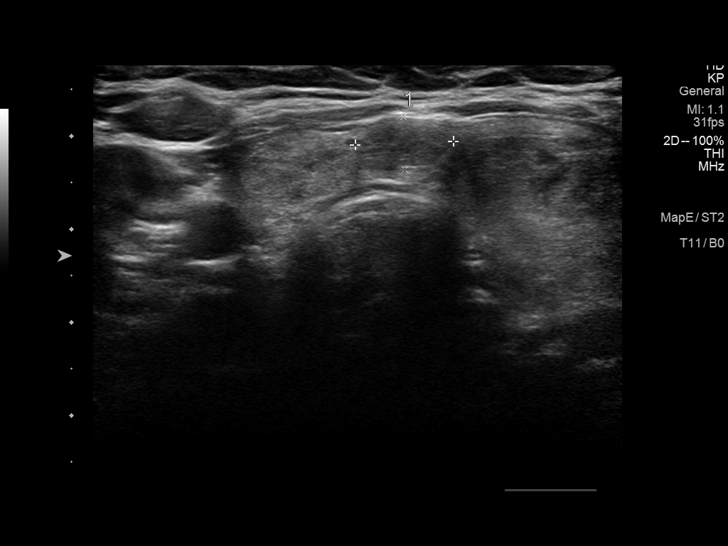
[im 9/65]
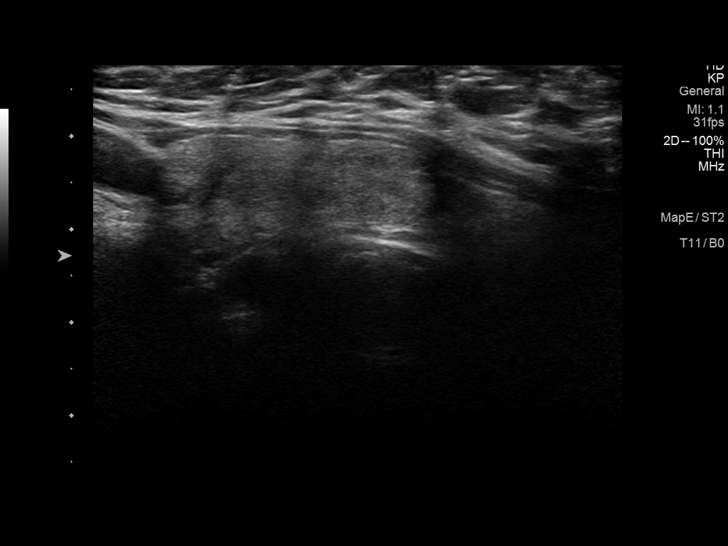
[im 14/65]
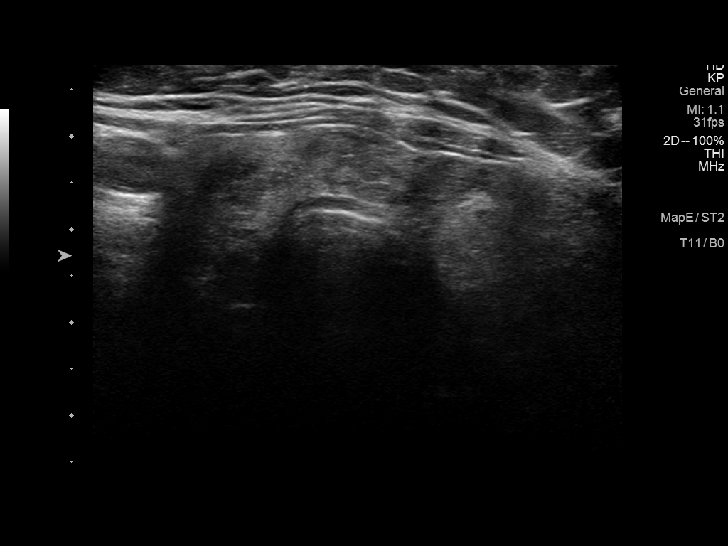
[im 19/65]
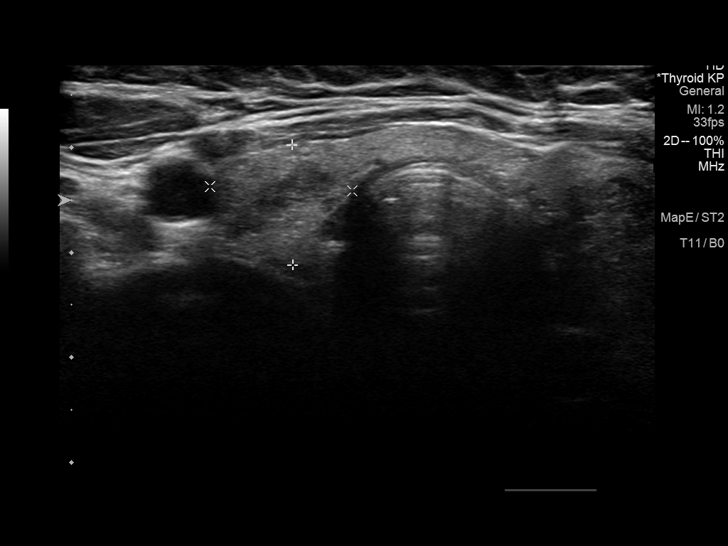
[im 25/65]
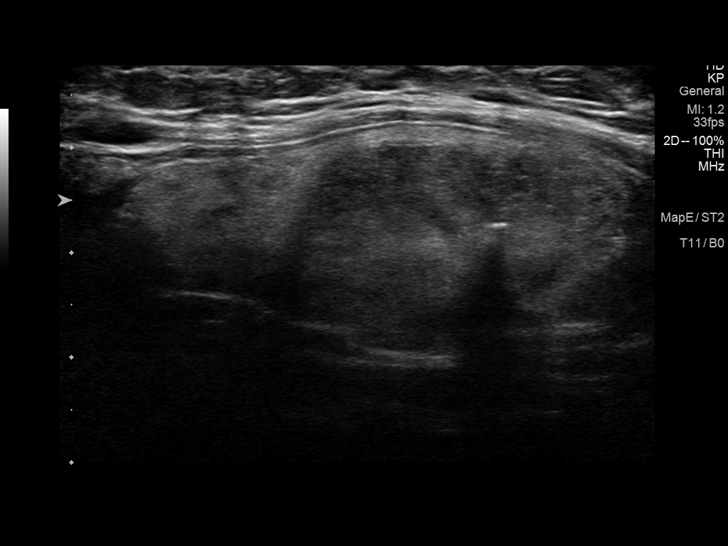
[im 30/65]
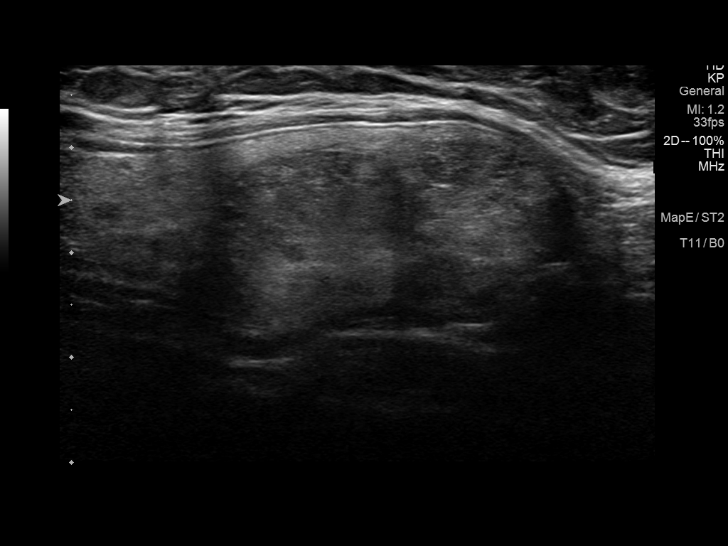
[im 35/65]
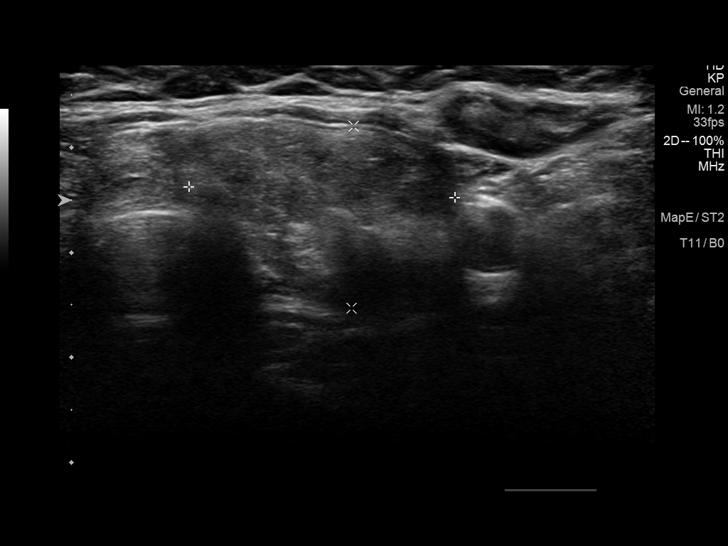
[im 41/65]
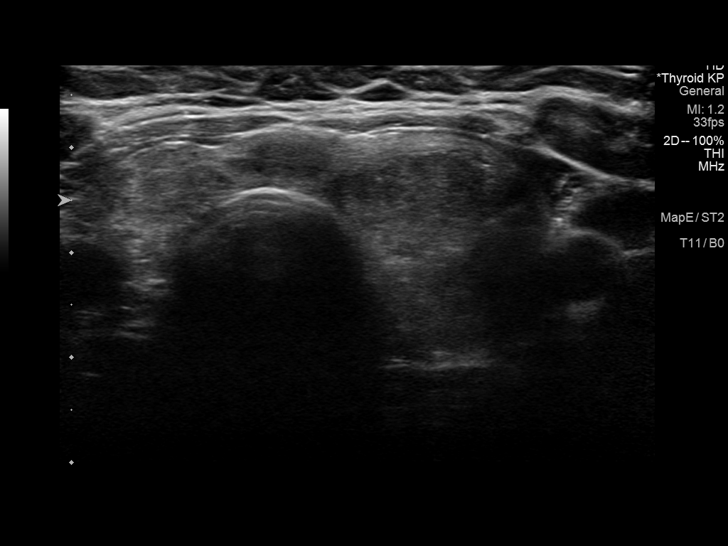
[im 46/65]
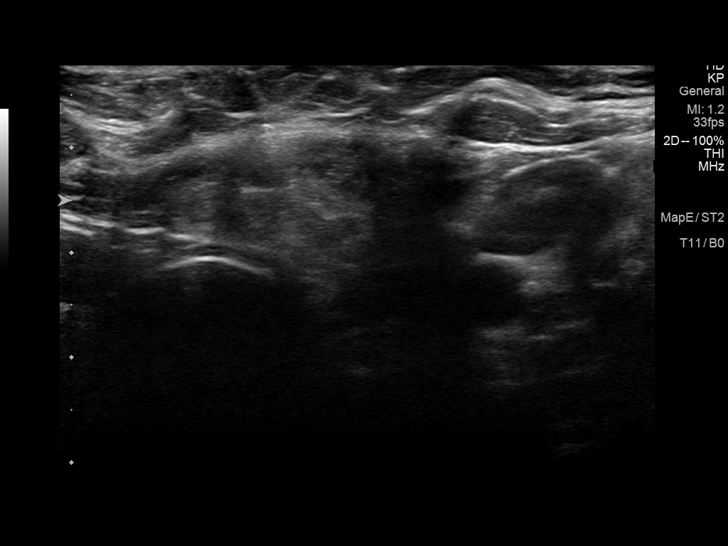
[im 51/65]
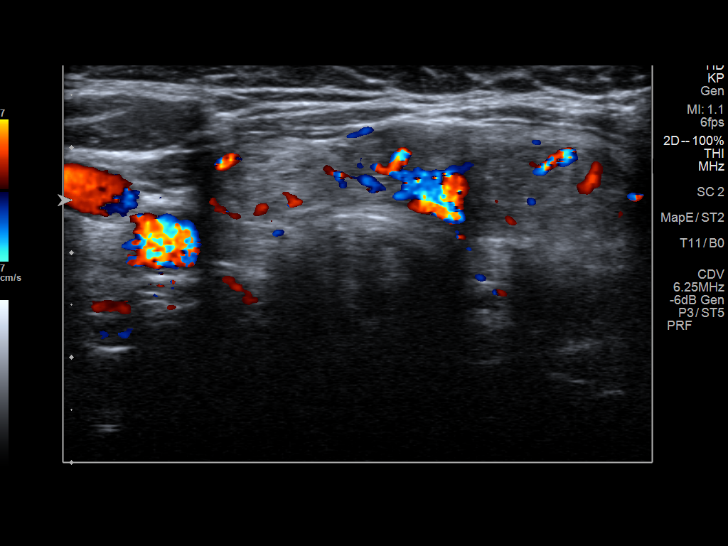
[im 57/65]
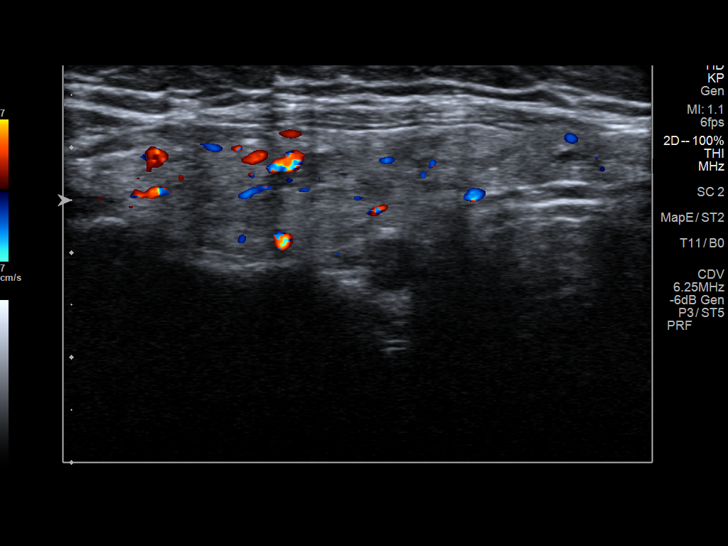
[im 62/65]
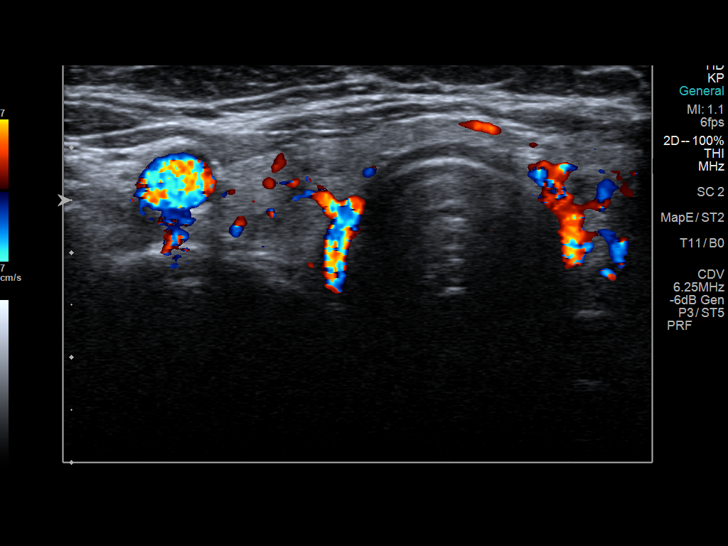

[12 of 25 positions shown; findings below may reference images not displayed]

FINDINGS: Parenchymal Echotexture: Moderately heterogenous

Isthmus: Normal in size measuring 0.7 cm in diameter

Right lobe: Normal in size measuring 3.6 x 1.2 x 1.4 cm, unchanged,
previously, 4.7 x 1.2 x 1.2 cm

Left lobe: Borderline enlarged measuring 5.1 x 1.8 x 2.9 cm,
unchanged, previously, 5.1 x 2.0 x 2.2 cm

_________________________________________________________

Estimated total number of nodules >/= 1 cm: 2

Number of spongiform nodules >/=  2 cm not described below (TR1): 0

Number of mixed cystic and solid nodules >/= 1.5 cm not described
below (TR2): 0

_________________________________________________________

The approximately 1.2 x 1.1 x 0.5 cm hypoechoic nodule within the
thyroid isthmus (labeled 1) is unchanged compared to the [DATE]
examination, previously, 1.1 x 1.1 x 0.6 cm. Stability for greater
than 5 years is indicative of benign etiology.

_________________________________________________________

The approximately 0.9 x 0.9 x 0.4 cm hypoechoic nodule within mid
aspect the right lobe of the thyroid (labeled 2) is unchanged to
decreased in size compared to the [DATE] examination, previously,
1.6 x 1.1 x 0.9 cm. Stability for greater than 5 years is indicative
of benign etiology.

The approximately 0.9 x 0.8 x 0.4 cm isoechoic ill-defined
nodule/pseudonodule within the inferior pole of the left lobe of the
thyroid (labeled 3) is unchanged compared to the [DATE]
examination, previously, 1.2 x 1.0 x 0.7 cm. Stability for greater
than 5 years is indicative of benign etiology.

_________________________________________________________

The previously biopsied approximately 3.4 x 2.9 x 1.8 cm ill-defined
hypoechoic nodule/pseudonodule replacing the mid and inferior
aspects of the left lobe of the thyroid (labeled 4) are grossly
unchanged compared to the [DATE] examination, previously, 3.3 x
x 1.8 cm, with size differences likely attributable to scan plane
projection.
IMPRESSION: 1. Similar findings of multinodular goiter. No worrisome new or
enlarging thyroid nodules.
2. Previously biopsied nodule within the left lobe of the thyroid
(labeled 4), is grossly unchanged compared to the [DATE]
examination. Correlation with prior biopsy results is recommended.
Assuming a benign pathologic diagnosis, repeat sampling and/or
continued dedicated follow-up is not recommended.
3. The remaining thyroid nodules are unchanged since the [DATE]
examination. Stability for greater than 5 years is indicative of
benign etiology and as such, repeat sampling and/or continued
dedicated follow-up is not recommended.

The above is in keeping with the ACR TI-RADS recommendations - [HOSPITAL] [3A];[DATE].

## 2019-06-26 ENCOUNTER — Encounter: Payer: Self-pay | Admitting: Internal Medicine

## 2019-06-26 ENCOUNTER — Other Ambulatory Visit: Payer: Self-pay | Admitting: Internal Medicine

## 2019-06-26 DIAGNOSIS — R1310 Dysphagia, unspecified: Secondary | ICD-10-CM

## 2019-06-28 DIAGNOSIS — H10012 Acute follicular conjunctivitis, left eye: Secondary | ICD-10-CM | POA: Diagnosis not present

## 2019-06-30 DIAGNOSIS — H00025 Hordeolum internum left lower eyelid: Secondary | ICD-10-CM | POA: Diagnosis not present

## 2019-07-04 ENCOUNTER — Ambulatory Visit
Admission: RE | Admit: 2019-07-04 | Discharge: 2019-07-04 | Disposition: A | Discharge status: Home / Self Care | Payer: BC Managed Care – PPO | Source: Ambulatory Visit | Attending: Internal Medicine | Admitting: Internal Medicine

## 2019-07-04 DIAGNOSIS — R1310 Dysphagia, unspecified: Secondary | ICD-10-CM

## 2019-07-04 DIAGNOSIS — K224 Dyskinesia of esophagus: Secondary | ICD-10-CM | POA: Diagnosis not present

## 2019-07-04 IMAGING — RF DG ESOPHAGUS
7 of 9 series · 14 of 24 positions shown · non-contrast
Comparison: [DATE] esophagram.

CLINICAL DATA: Dysphagia with episodes of choking and pills
sticking in the throat. Thyroid nodules.

EXAM:
ESOPHOGRAM / BARIUM SWALLOW / BARIUM TABLET STUDY
TECHNIQUE: Combined double contrast and single contrast examination performed
using effervescent crystals, thick barium liquid, and thin barium
liquid. The patient was observed with fluoroscopy swallowing a 13 mm
barium sulphate tablet.
FLUOROSCOPY TIME:  Fluoroscopy Time:  2 minutes 12 seconds
Radiation Exposure Index (if provided by the fluoroscopic device):
60 mg
Number of Acquired Spot Images: 9

[Series 1: sequence · 1 of 5 frames shown (1 of 6)]
[frame 1/5]
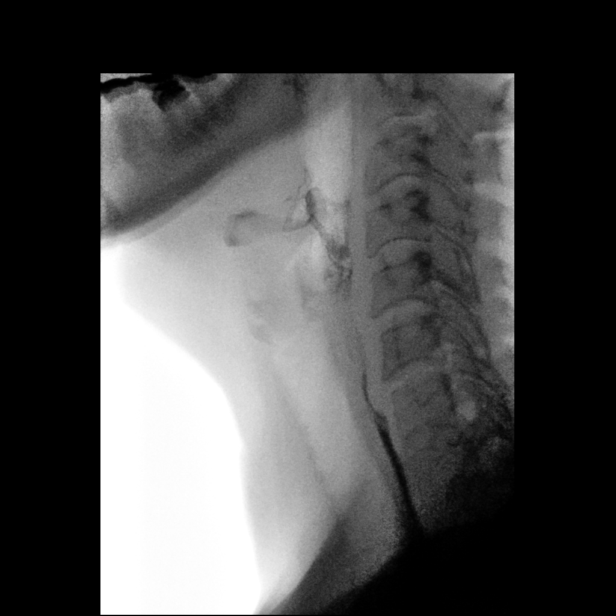

[Series 2: sequence · 2 of 12 frames shown (2 of 6)]
[frame 1/12]
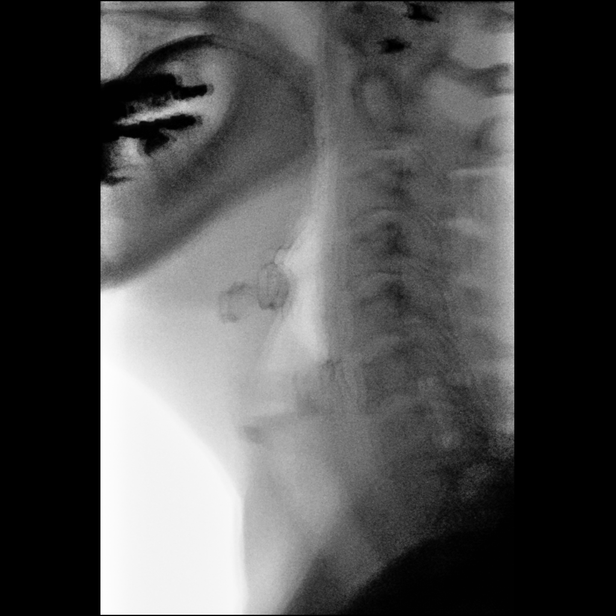
[frame 7/12]
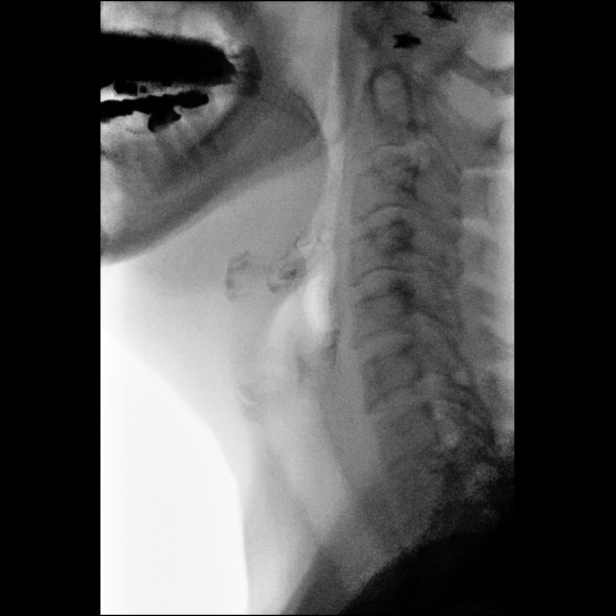

[Series 4: sequence · 2 of 14 frames shown (3 of 6)]
[frame 1/14]
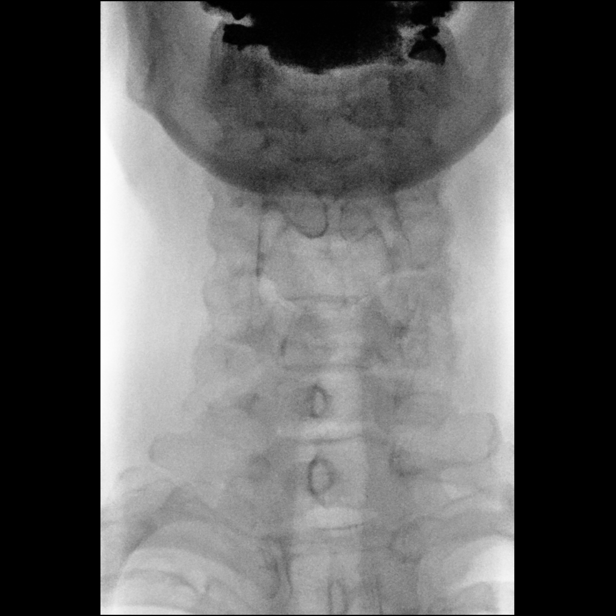
[frame 3/14]
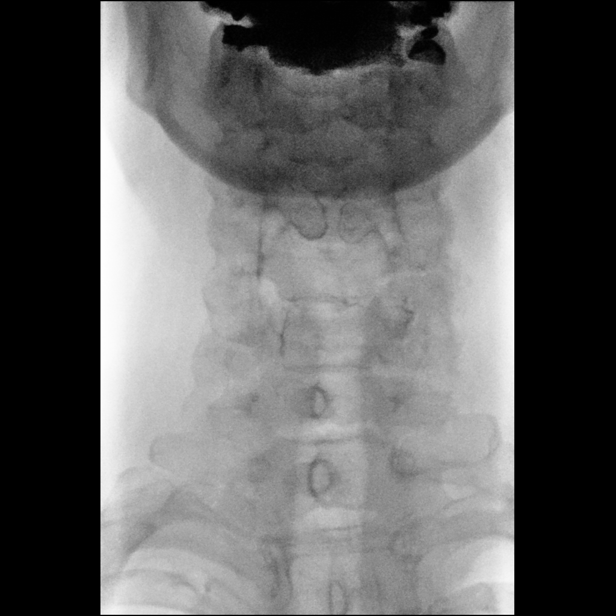

[Series 5: one shot · 0.15mm/px · 4 of 8 slices shown]
[im 1/8]
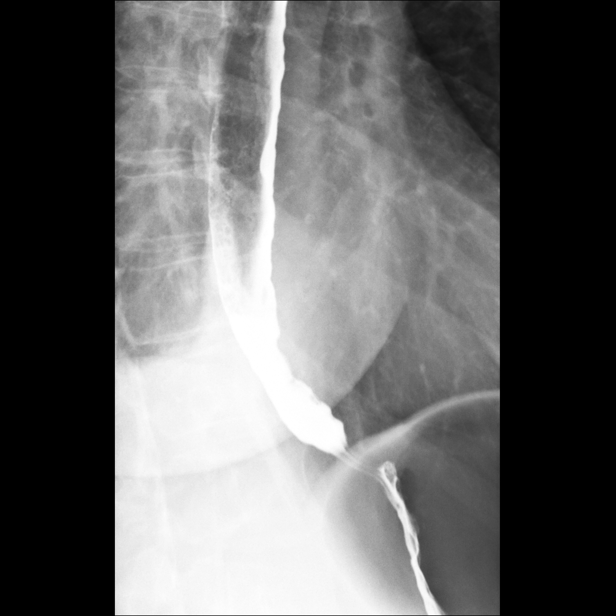
[im 4/8]
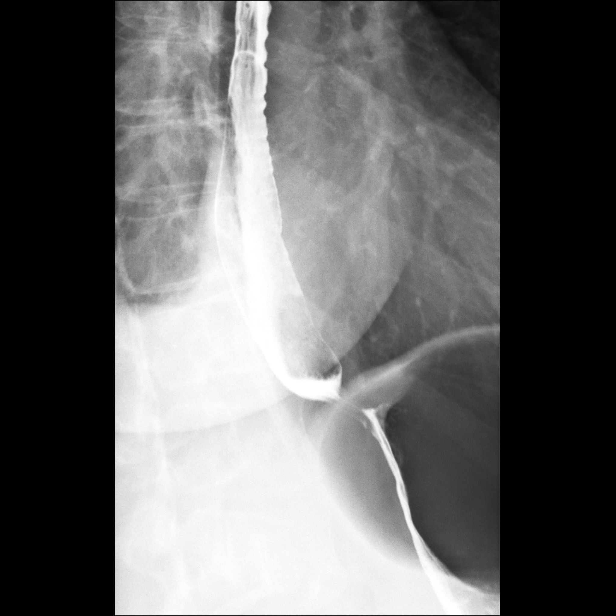
[im 5/8]
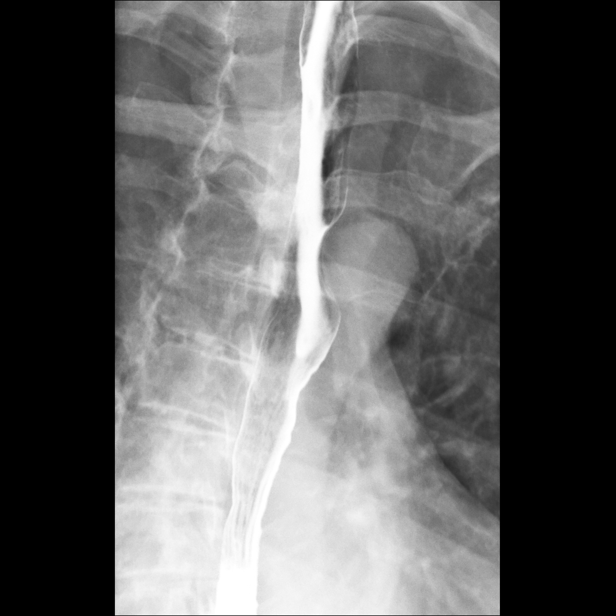
[im 8/8]
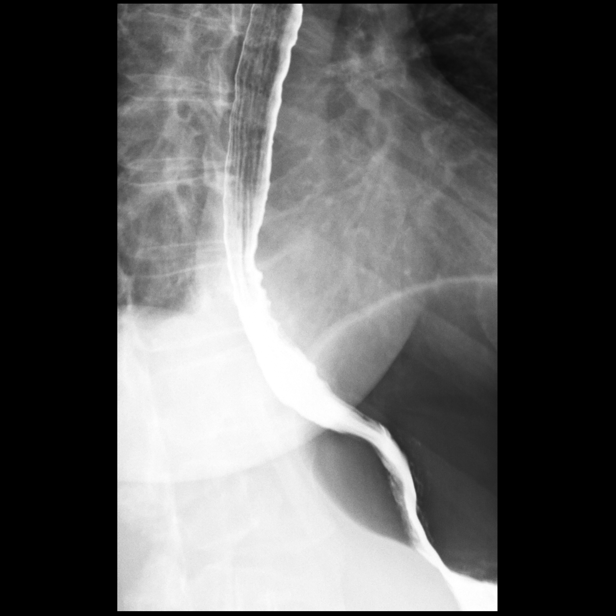

[Series 6: sequence · 1 of 32 frames shown (4 of 6)]
[frame 17/32]
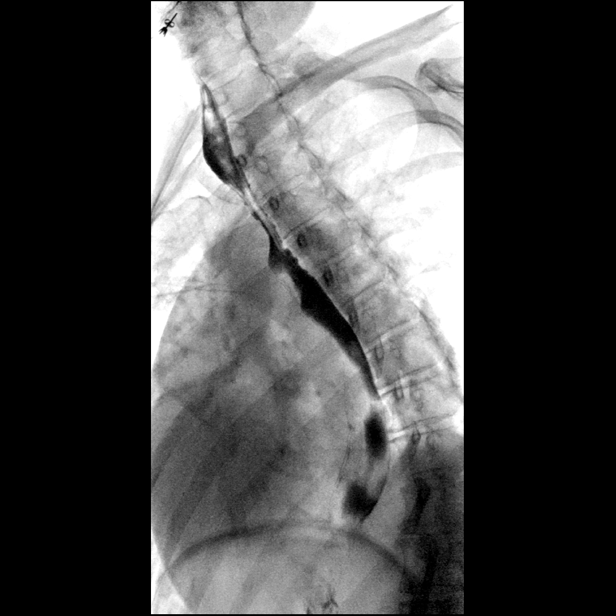

[Series 7: sequence · 2 of 92 frames shown (5 of 6)]
[frame 14/92]
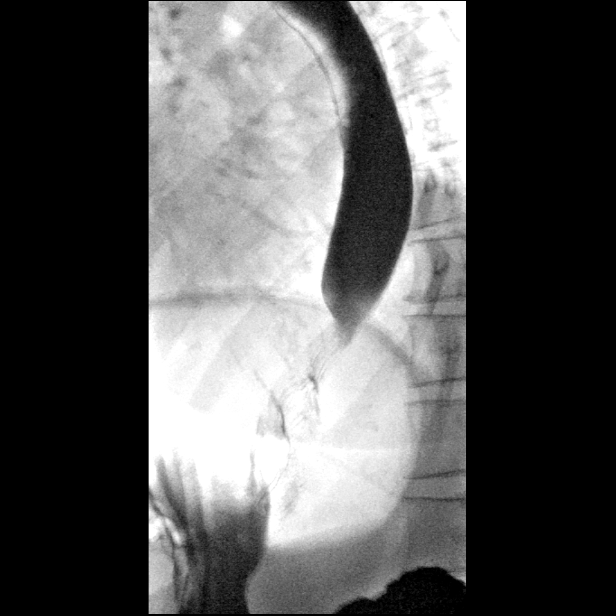
[frame 79/92]
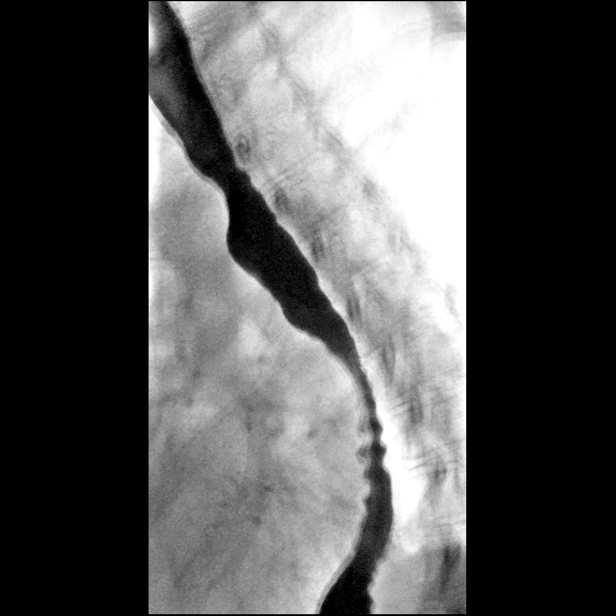

[Series 9: sequence · 2 of 23 frames shown (6 of 6)]
[frame 4/23]
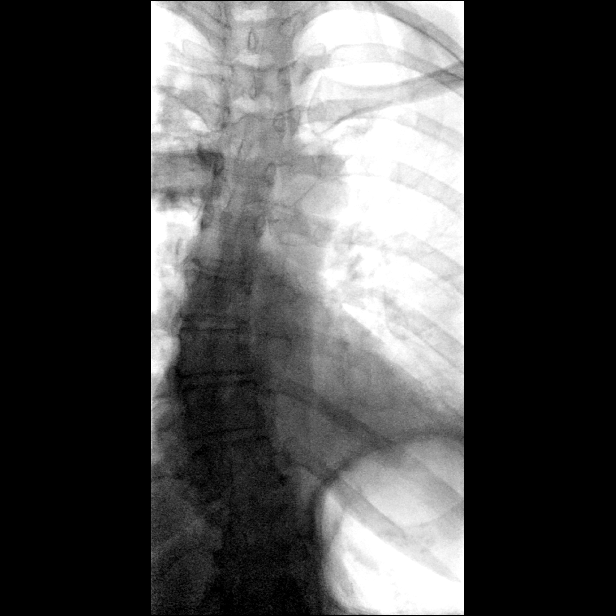
[frame 21/23]
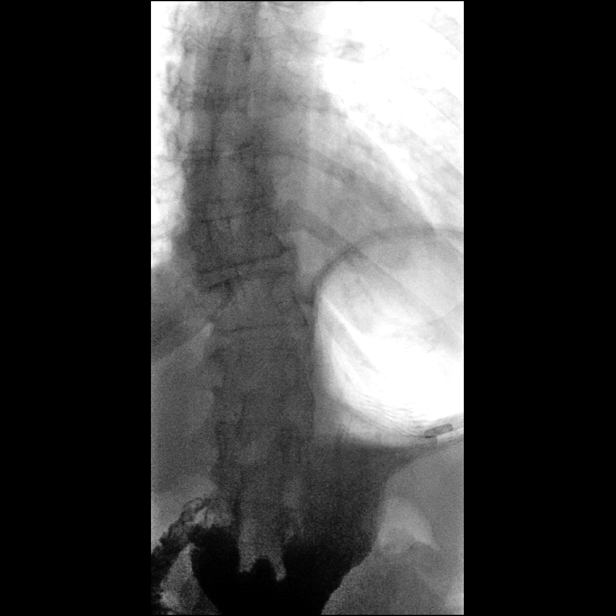

[14 of 24 positions shown; findings below may reference images not displayed]

FINDINGS: Normal oral and pharyngeal phases of swallowing, with no laryngeal
penetration or tracheobronchial aspiration. No significant barium
retention in the pharynx. No evidence of pharyngeal mass, stricture
or diverticulum. No evidence of cricopharyngeus muscle dysfunction.

There is mild esophageal dysmotility, characterized proximal escape
of the barium bolus in the upper thoracic esophagus and by mild
tertiary contractions. No hiatal hernia. No gastroesophageal reflux
elicited, despite provocative maneuvers including Valsalva maneuver
and water siphon test. Normal esophageal mucosa, with no evidence of
reflux esophagitis. Normal esophageal distensibility, with no
evidence of esophageal mass, stricture or ulcer. Barium tablet
traversed the esophagus into the stomach without delay.
IMPRESSION: 1. Mild esophageal dysmotility with presbyesophagus pattern.
2. Otherwise normal esophagram. No hiatal hernia. No
gastroesophageal reflux elicited. No evidence of esophageal mass,
stricture or ulcer.

## 2019-08-05 ENCOUNTER — Other Ambulatory Visit (INDEPENDENT_AMBULATORY_CARE_PROVIDER_SITE_OTHER): Payer: BC Managed Care – PPO

## 2019-08-05 ENCOUNTER — Other Ambulatory Visit: Payer: Self-pay

## 2019-08-05 DIAGNOSIS — E039 Hypothyroidism, unspecified: Secondary | ICD-10-CM

## 2019-08-05 LAB — T4, FREE: Free T4: 0.83 ng/dL (ref 0.60–1.60)

## 2019-08-05 LAB — TSH: TSH: 1.34 u[IU]/mL (ref 0.35–4.50)

## 2019-08-14 ENCOUNTER — Encounter: Payer: Self-pay | Admitting: Internal Medicine

## 2019-08-14 ENCOUNTER — Other Ambulatory Visit: Payer: Self-pay | Admitting: Internal Medicine

## 2019-08-14 DIAGNOSIS — E039 Hypothyroidism, unspecified: Secondary | ICD-10-CM

## 2019-08-14 NOTE — Telephone Encounter (Signed)
Please advise 

## 2019-08-20 ENCOUNTER — Other Ambulatory Visit: Payer: Self-pay

## 2019-08-20 ENCOUNTER — Ambulatory Visit
Admission: RE | Admit: 2019-08-20 | Discharge: 2019-08-20 | Disposition: A | Discharge status: Home / Self Care | Payer: BC Managed Care – PPO | Source: Ambulatory Visit | Attending: Women's Health | Admitting: Women's Health

## 2019-08-20 DIAGNOSIS — D2272 Melanocytic nevi of left lower limb, including hip: Secondary | ICD-10-CM | POA: Diagnosis not present

## 2019-08-20 DIAGNOSIS — Z1231 Encounter for screening mammogram for malignant neoplasm of breast: Secondary | ICD-10-CM | POA: Diagnosis not present

## 2019-08-20 DIAGNOSIS — Z85820 Personal history of malignant melanoma of skin: Secondary | ICD-10-CM | POA: Diagnosis not present

## 2019-08-20 DIAGNOSIS — B078 Other viral warts: Secondary | ICD-10-CM | POA: Diagnosis not present

## 2019-08-20 DIAGNOSIS — D485 Neoplasm of uncertain behavior of skin: Secondary | ICD-10-CM | POA: Diagnosis not present

## 2019-08-20 DIAGNOSIS — D2261 Melanocytic nevi of right upper limb, including shoulder: Secondary | ICD-10-CM | POA: Diagnosis not present

## 2019-08-20 DIAGNOSIS — D2271 Melanocytic nevi of right lower limb, including hip: Secondary | ICD-10-CM | POA: Diagnosis not present

## 2019-08-20 IMAGING — MG DIGITAL SCREENING BILAT W/ TOMO W/ CAD
8 series · 8 of 24 positions shown · non-contrast
Comparison: Previous exam(s).

CLINICAL DATA: Screening.

EXAM:
DIGITAL SCREENING BILATERAL MAMMOGRAM WITH TOMO AND CAD

[L CC synth-2D]
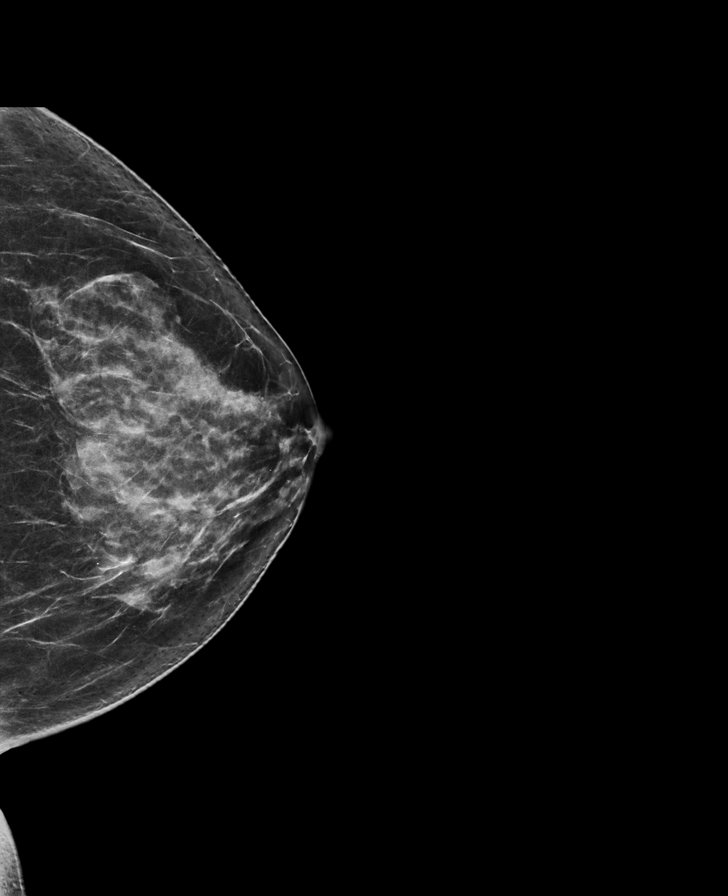

[L MLO synth-2D]
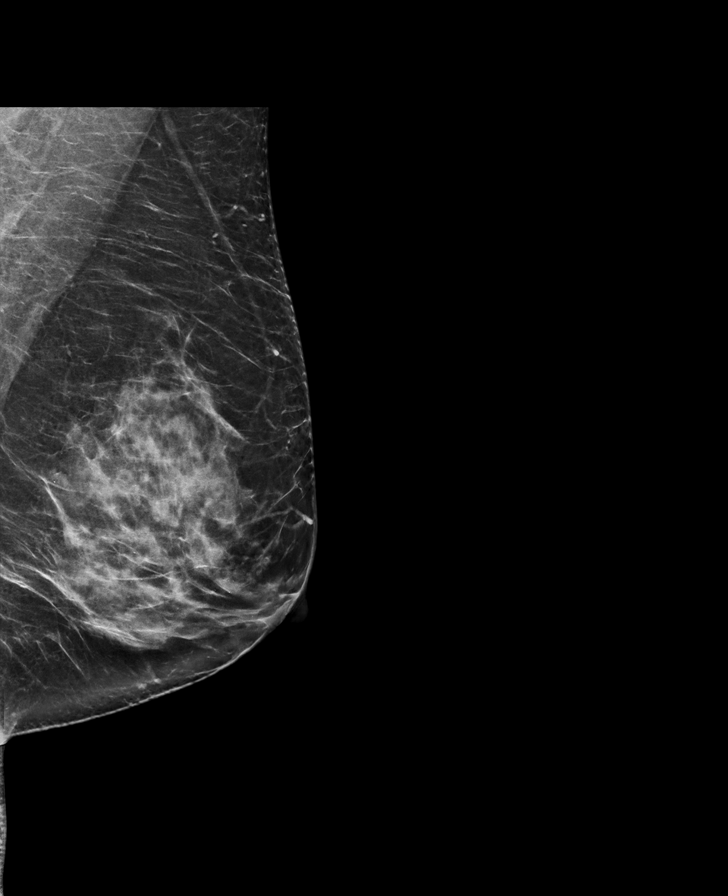

[R MLO synth-2D]
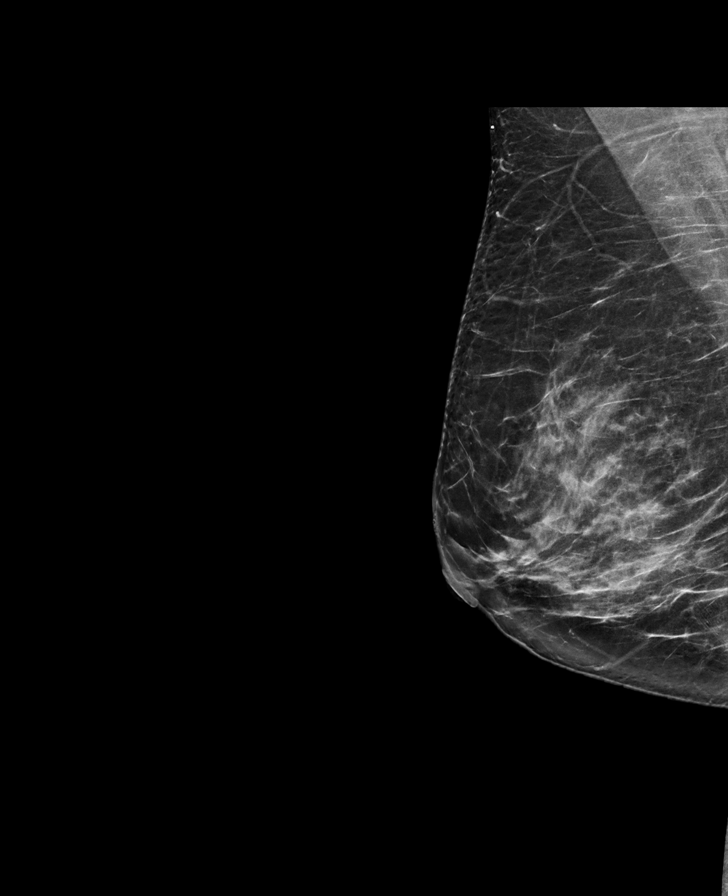

[R CC synth-2D]
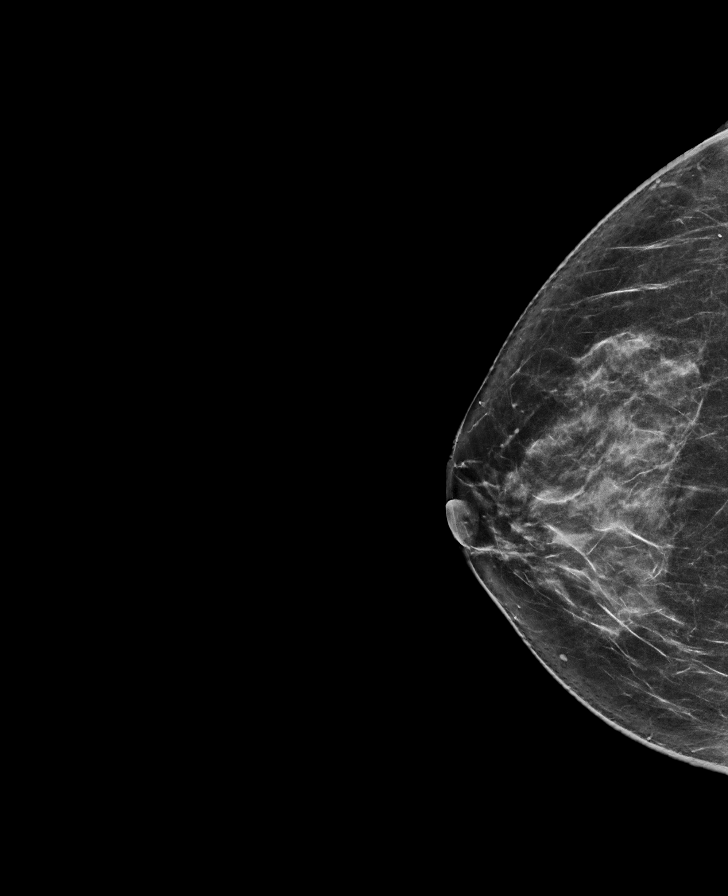

[L CC tomo · tomo slice 35/70.0]
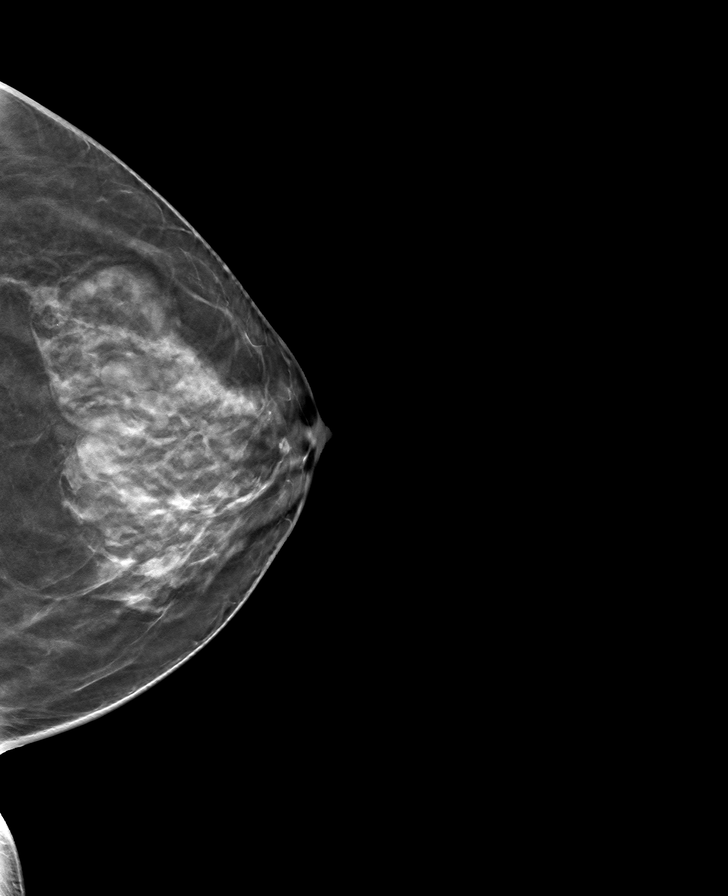

[R CC tomo · tomo slice 36/71.0]
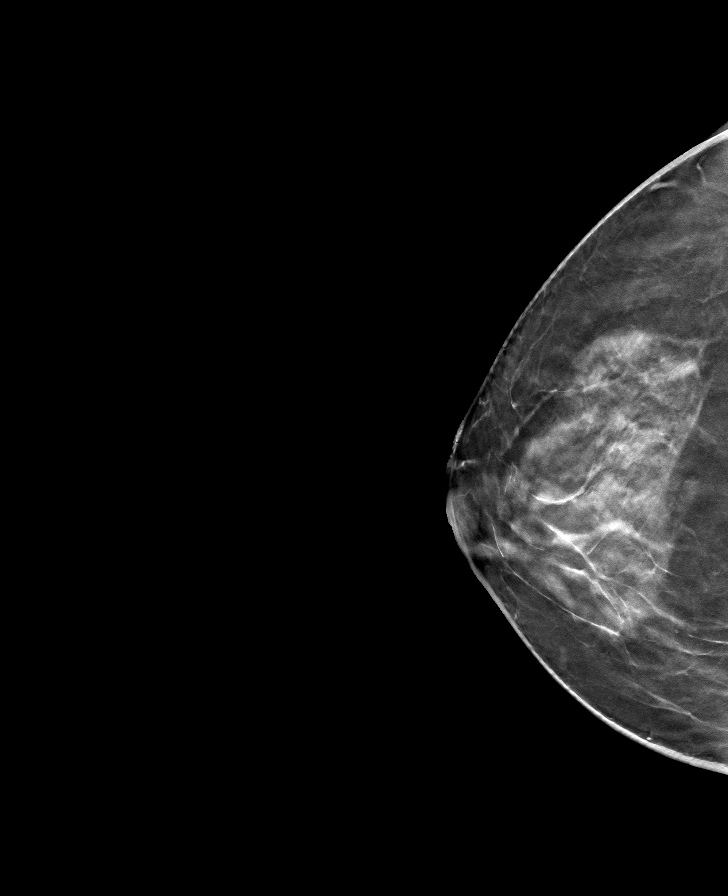

[L MLO tomo · tomo slice 41/80.0]
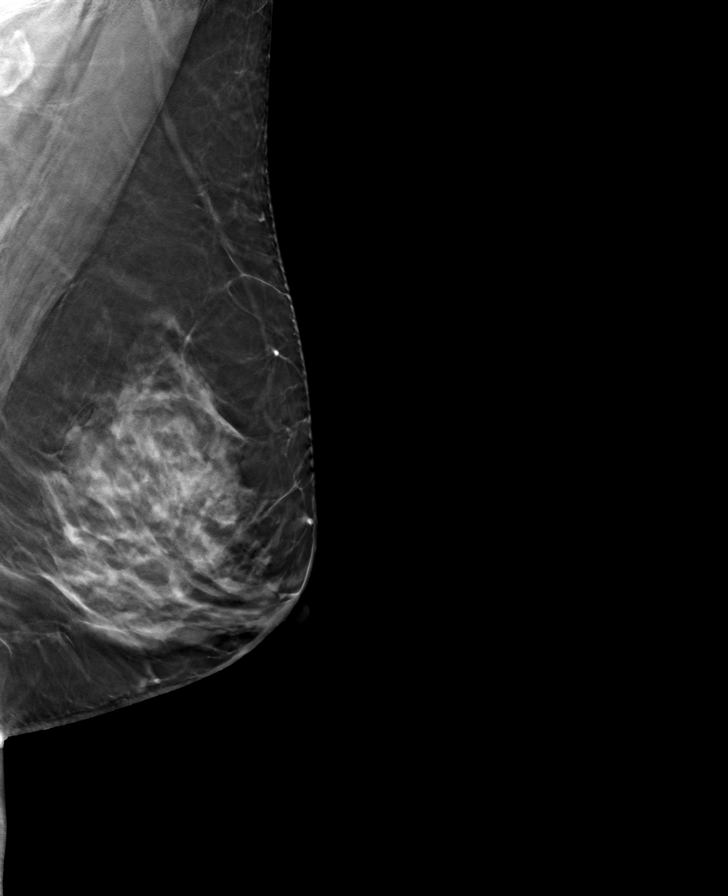

[R MLO tomo · tomo slice 37/72.0]
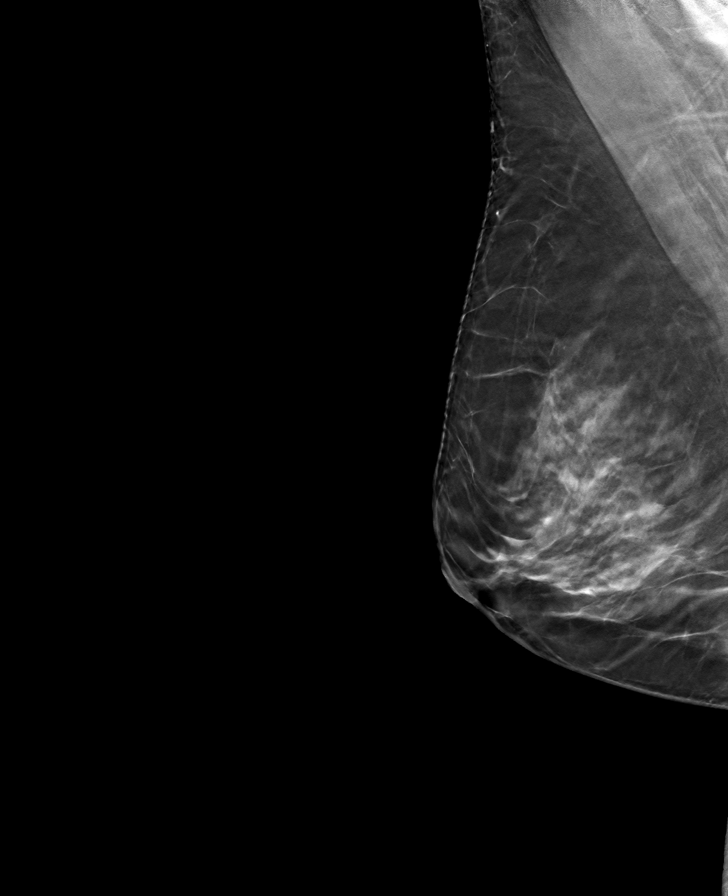

[8 of 24 positions shown; findings below may reference images not displayed]

ACR Breast Density Category c: The breast tissue is heterogeneously
dense, which may obscure small masses.
FINDINGS: There are no findings suspicious for malignancy. Images were
processed with CAD.
IMPRESSION: No mammographic evidence of malignancy. A result letter of this
screening mammogram will be mailed directly to the patient.

RECOMMENDATION:
Screening mammogram in one year. (Code:[5V])

BI-RADS CATEGORY  1: Negative.

## 2019-09-02 DIAGNOSIS — L988 Other specified disorders of the skin and subcutaneous tissue: Secondary | ICD-10-CM | POA: Diagnosis not present

## 2019-09-02 DIAGNOSIS — D485 Neoplasm of uncertain behavior of skin: Secondary | ICD-10-CM | POA: Diagnosis not present

## 2019-09-16 ENCOUNTER — Other Ambulatory Visit (INDEPENDENT_AMBULATORY_CARE_PROVIDER_SITE_OTHER): Payer: BC Managed Care – PPO

## 2019-09-16 ENCOUNTER — Other Ambulatory Visit: Payer: Self-pay

## 2019-09-16 ENCOUNTER — Other Ambulatory Visit: Payer: Self-pay | Admitting: Internal Medicine

## 2019-09-16 DIAGNOSIS — E039 Hypothyroidism, unspecified: Secondary | ICD-10-CM | POA: Diagnosis not present

## 2019-09-16 LAB — T4, FREE: Free T4: 0.85 ng/dL (ref 0.60–1.60)

## 2019-09-16 LAB — T3, FREE: T3, Free: 3 pg/mL (ref 2.3–4.2)

## 2019-09-16 LAB — TSH: TSH: 1.21 u[IU]/mL (ref 0.35–4.50)

## 2019-09-17 ENCOUNTER — Other Ambulatory Visit: Payer: BC Managed Care – PPO

## 2019-09-29 ENCOUNTER — Other Ambulatory Visit: Payer: Self-pay

## 2019-09-30 ENCOUNTER — Ambulatory Visit (INDEPENDENT_AMBULATORY_CARE_PROVIDER_SITE_OTHER): Payer: BC Managed Care – PPO | Admitting: Women's Health

## 2019-09-30 ENCOUNTER — Encounter: Payer: Self-pay | Admitting: Women's Health

## 2019-09-30 VITALS — BP 110/78 | Ht 65.0 in | Wt 158.0 lb

## 2019-09-30 DIAGNOSIS — Z13220 Encounter for screening for lipoid disorders: Secondary | ICD-10-CM

## 2019-09-30 DIAGNOSIS — Z01419 Encounter for gynecological examination (general) (routine) without abnormal findings: Secondary | ICD-10-CM | POA: Diagnosis not present

## 2019-09-30 MED ORDER — ESTRADIOL 0.5 MG PO TABS: 0.5000 mg | ORAL_TABLET | Freq: Every day | ORAL | 4 refills | Status: DC

## 2019-09-30 NOTE — Patient Instructions (Addendum)
It was good seeing you today Vitamin D 2000 daily shingrex shingles vaccine   Health Maintenance for Postmenopausal Women Menopause is a normal process in which your ability to get pregnant comes to an end. This process happens slowly over many months or years, usually between the ages of 92 and 75. Menopause is complete when you have missed your menstrual periods for 12 months. It is important to talk with your health care provider about some of the most common conditions that affect women after menopause (postmenopausal women). These include heart disease, cancer, and bone loss (osteoporosis). Adopting a healthy lifestyle and getting preventive care can help to promote your health and wellness. The actions you take can also lower your chances of developing some of these common conditions. What should I know about menopause? During menopause, you may get a number of symptoms, such as:  Hot flashes. These can be moderate or severe.  Night sweats.  Decrease in sex drive.  Mood swings.  Headaches.  Tiredness.  Irritability.  Memory problems.  Insomnia. Choosing to treat or not to treat these symptoms is a decision that you make with your health care provider. Do I need hormone replacement therapy?  Hormone replacement therapy is effective in treating symptoms that are caused by menopause, such as hot flashes and night sweats.  Hormone replacement carries certain risks, especially as you become older. If you are thinking about using estrogen or estrogen with progestin, discuss the benefits and risks with your health care provider. What is my risk for heart disease and stroke? The risk of heart disease, heart attack, and stroke increases as you age. One of the causes may be a change in the body's hormones during menopause. This can affect how your body uses dietary fats, triglycerides, and cholesterol. Heart attack and stroke are medical emergencies. There are many things that you can  do to help prevent heart disease and stroke. Watch your blood pressure  High blood pressure causes heart disease and increases the risk of stroke. This is more likely to develop in people who have high blood pressure readings, are of African descent, or are overweight.  Have your blood pressure checked: ? Every 3-5 years if you are 63-70 years of age. ? Every year if you are 52 years old or older. Eat a healthy diet   Eat a diet that includes plenty of vegetables, fruits, low-fat dairy products, and lean protein.  Do not eat a lot of foods that are high in solid fats, added sugars, or sodium. Get regular exercise Get regular exercise. This is one of the most important things you can do for your health. Most adults should:  Try to exercise for at least 150 minutes each week. The exercise should increase your heart rate and make you sweat (moderate-intensity exercise).  Try to do strengthening exercises at least twice each week. Do these in addition to the moderate-intensity exercise.  Spend less time sitting. Even light physical activity can be beneficial. Other tips  Work with your health care provider to achieve or maintain a healthy weight.  Do not use any products that contain nicotine or tobacco, such as cigarettes, e-cigarettes, and chewing tobacco. If you need help quitting, ask your health care provider.  Know your numbers. Ask your health care provider to check your cholesterol and your blood sugar (glucose). Continue to have your blood tested as directed by your health care provider. Do I need screening for cancer? Depending on your health history and family  history, you may need to have cancer screening at different stages of your life. This may include screening for:  Breast cancer.  Cervical cancer.  Lung cancer.  Colorectal cancer. What is my risk for osteoporosis? After menopause, you may be at increased risk for osteoporosis. Osteoporosis is a condition in which  bone destruction happens more quickly than new bone creation. To help prevent osteoporosis or the bone fractures that can happen because of osteoporosis, you may take the following actions:  If you are 45-7 years old, get at least 1,000 mg of calcium and at least 600 mg of vitamin D per day.  If you are older than age 51 but younger than age 57, get at least 1,200 mg of calcium and at least 600 mg of vitamin D per day.  If you are older than age 47, get at least 1,200 mg of calcium and at least 800 mg of vitamin D per day. Smoking and drinking excessive alcohol increase the risk of osteoporosis. Eat foods that are rich in calcium and vitamin D, and do weight-bearing exercises several times each week as directed by your health care provider. How does menopause affect my mental health? Depression may occur at any age, but it is more common as you become older. Common symptoms of depression include:  Low or sad mood.  Changes in sleep patterns.  Changes in appetite or eating patterns.  Feeling an overall lack of motivation or enjoyment of activities that you previously enjoyed.  Frequent crying spells. Talk with your health care provider if you think that you are experiencing depression. General instructions See your health care provider for regular wellness exams and vaccines. This may include:  Scheduling regular health, dental, and eye exams.  Getting and maintaining your vaccines. These include: ? Influenza vaccine. Get this vaccine each year before the flu season begins. ? Pneumonia vaccine. ? Shingles vaccine. ? Tetanus, diphtheria, and pertussis (Tdap) booster vaccine. Your health care provider may also recommend other immunizations. Tell your health care provider if you have ever been abused or do not feel safe at home. Summary  Menopause is a normal process in which your ability to get pregnant comes to an end.  This condition causes hot flashes, night sweats, decreased  interest in sex, mood swings, headaches, or lack of sleep.  Treatment for this condition may include hormone replacement therapy.  Take actions to keep yourself healthy, including exercising regularly, eating a healthy diet, watching your weight, and checking your blood pressure and blood sugar levels.  Get screened for cancer and depression. Make sure that you are up to date with all your vaccines. This information is not intended to replace advice given to you by your health care provider. Make sure you discuss any questions you have with your health care provider. Document Released: 11/24/2005 Document Revised: 09/25/2018 Document Reviewed: 09/25/2018 Elsevier Patient Education  2020 [employer].

## 2019-09-30 NOTE — Progress Notes (Signed)
Sharon Kirk Mar 30, 1961 [id number]    History:    Presents for annual exam.  1991 TVH for menorrhagia on estradiol 0.5 mg.  Has had persistent hot flashes which are now basically resolved.  Normal Pap and mammogram history.  Hypothyroid, endocrinologist discontinued med this year.  Long-term history of IBS, maternal grandmother colon cancer and numerous GI issues.   2020 no polyps, diverticuli on colonoscopy. 2019 T score -1.5 at femoral neck FRAX 7.9% / 0.7%.  Has not had Shingrix.  Past medical history, past surgical history, family history and social history were all reviewed and documented in the EPIC chart.  Working from home for [employer].  Weight is down 7 pounds with diet and exercise.  ROS:  A ROS was performed and pertinent positives and negatives are included.  Exam:  Vitals:   09/30/19 0804  BP: 110/78  Weight: 158 lb (71.7 kg)  Height: 5\' 5"  (1.651 m)   Body mass index is 26.29 kg/m.   General appearance:  Normal scoliosis Thyroid:  Symmetrical, normal in size, without palpable masses or nodularity. Respiratory  Auscultation:  Clear without wheezing or rhonchi Cardiovascular  Auscultation:  Regular rate, without rubs, murmurs or gallops  Edema/varicosities:  Not grossly evident Abdominal  Soft,nontender, without masses, guarding or rebound.  Liver/spleen:  No organomegaly noted  Hernia:  None appreciated  Skin  Inspection:  Grossly normal numerous moles   Breasts: Examined lying and sitting.     Right: Without masses, retractions, discharge or axillary adenopathy.     Left: Without masses, retractions, discharge or axillary adenopathy. Gentitourinary   Inguinal/mons:  Normal without inguinal adenopathy  External genitalia:  Normal  BUS/Urethra/Skene's glands:  Normal  Vagina:  Normal  Cervix: And uterus absent   Adnexa/parametria:     Rt: Without masses or tenderness.   Lt: Without masses or tenderness.  Anus and perineum: Normal  Digital rectal exam: Normal  sphincter tone without palpated masses or tenderness  Assessment/Plan:  58 y.o. MWF G0 for annual exam with no complaints.  55 TVH for menorrhagia on HRT Osteopenia without elevated FRAX 2017 melanoma annual skin checks Hypothyroidism-endocrinologist manages currently on no medication  Plan: HRT reviewed risks of blood clots, strokes and breast cancer, states prefers to stay on due to hot flashes, will try 0.5 estradiol half tablet reviewed much less risk with a lower dose agreeable.  Prescription given.  SBEs, continue annual 3D screening mammogram, calcium rich foods, vitamin D 2000 daily encouraged.  Osteopenia discussed importance of weightbearing and balance type exercise, encouraged to continue active lifestyle and add yoga.  CBC, CMP, lipid panel, Pap screening guidelines reviewed.  Shingrix vaccine discussed encouraged.  Declined flu vaccine.   [place], 8:12 AM 09/30/2019

## 2019-10-01 LAB — COMPREHENSIVE METABOLIC PANEL
AG Ratio: 1.9 (calc) (ref 1.0–2.5)
ALT: 14 U/L (ref 6–29)
AST: 16 U/L (ref 10–35)
Albumin: 4.3 g/dL (ref 3.6–5.1)
Alkaline phosphatase (APISO): 41 U/L (ref 37–153)
BUN: 13 mg/dL (ref 7–25)
CO2: 27 mmol/L (ref 20–32)
Calcium: 9.8 mg/dL (ref 8.6–10.4)
Chloride: 104 mmol/L (ref 98–110)
Creat: 0.61 mg/dL (ref 0.50–0.99)
Globulin: 2.3 g/dL (calc) (ref 1.9–3.7)
Glucose, Bld: 88 mg/dL (ref 65–99)
Potassium: 4.3 mmol/L (ref 3.5–5.3)
Sodium: 141 mmol/L (ref 135–146)
Total Bilirubin: 0.5 mg/dL (ref 0.2–1.2)
Total Protein: 6.6 g/dL (ref 6.1–8.1)

## 2019-10-01 LAB — CBC WITH DIFFERENTIAL/PLATELET
Absolute Monocytes: 348 cells/uL (ref 200–950)
Basophils Absolute: 28 cells/uL (ref 0–200)
Basophils Relative: 0.7 %
Eosinophils Absolute: 40 cells/uL (ref 15–500)
Eosinophils Relative: 1 %
HCT: 43.6 % (ref 35.0–45.0)
Hemoglobin: 14.7 g/dL (ref 11.7–15.5)
Lymphs Abs: 1336 cells/uL (ref 850–3900)
MCH: 29.1 pg (ref 27.0–33.0)
MCHC: 33.7 g/dL (ref 32.0–36.0)
MCV: 86.2 fL (ref 80.0–100.0)
MPV: 10.2 fL (ref 7.5–12.5)
Monocytes Relative: 8.7 %
Neutro Abs: 2248 cells/uL (ref 1500–7800)
Neutrophils Relative %: 56.2 %
Platelets: 216 10*3/uL (ref 140–400)
RBC: 5.06 10*6/uL (ref 3.80–5.10)
RDW: 13.4 % (ref 11.0–15.0)
Total Lymphocyte: 33.4 %
WBC: 4 10*3/uL (ref 3.8–10.8)

## 2019-10-01 LAB — LIPID PANEL
Cholesterol: 237 mg/dL — ABNORMAL HIGH (ref <200)
HDL: 59 mg/dL (ref >=50)
LDL Cholesterol (Calc): 154 mg/dL (calc) — ABNORMAL HIGH
Non-HDL Cholesterol (Calc): 178 mg/dL (calc) — ABNORMAL HIGH (ref <130)
Total CHOL/HDL Ratio: 4 (calc) (ref <5.0)
Triglycerides: 122 mg/dL (ref <150)

## 2019-10-01 NOTE — Progress Notes (Signed)
Please call and review CBC, blood sugar, liver enzymes and electrolytes all normal.  Cholesterol is elevated, continue regular walking, less than 20 g saturated fat daily may be somewhat genetic add a red rice yeast supplement daily.  May need a cholesterol-lowering medication, have her follow-up with primary care for possible medication.

## 2019-11-13 DIAGNOSIS — Z20828 Contact with and (suspected) exposure to other viral communicable diseases: Secondary | ICD-10-CM | POA: Diagnosis not present

## 2020-02-17 DIAGNOSIS — H52203 Unspecified astigmatism, bilateral: Secondary | ICD-10-CM | POA: Diagnosis not present

## 2020-02-17 DIAGNOSIS — H524 Presbyopia: Secondary | ICD-10-CM | POA: Diagnosis not present

## 2020-02-17 DIAGNOSIS — H43813 Vitreous degeneration, bilateral: Secondary | ICD-10-CM | POA: Diagnosis not present

## 2020-02-17 DIAGNOSIS — H5203 Hypermetropia, bilateral: Secondary | ICD-10-CM | POA: Diagnosis not present

## 2020-03-03 DIAGNOSIS — D485 Neoplasm of uncertain behavior of skin: Secondary | ICD-10-CM | POA: Diagnosis not present

## 2020-03-03 DIAGNOSIS — D224 Melanocytic nevi of scalp and neck: Secondary | ICD-10-CM | POA: Diagnosis not present

## 2020-03-03 DIAGNOSIS — D225 Melanocytic nevi of trunk: Secondary | ICD-10-CM | POA: Diagnosis not present

## 2020-03-03 DIAGNOSIS — Z85820 Personal history of malignant melanoma of skin: Secondary | ICD-10-CM | POA: Diagnosis not present

## 2020-03-03 DIAGNOSIS — D2261 Melanocytic nevi of right upper limb, including shoulder: Secondary | ICD-10-CM | POA: Diagnosis not present

## 2020-03-30 DIAGNOSIS — L988 Other specified disorders of the skin and subcutaneous tissue: Secondary | ICD-10-CM | POA: Diagnosis not present

## 2020-03-30 DIAGNOSIS — D485 Neoplasm of uncertain behavior of skin: Secondary | ICD-10-CM | POA: Diagnosis not present

## 2020-04-12 DIAGNOSIS — Z4802 Encounter for removal of sutures: Secondary | ICD-10-CM | POA: Diagnosis not present

## 2020-06-10 ENCOUNTER — Other Ambulatory Visit: Payer: Self-pay | Admitting: Nurse Practitioner

## 2020-06-10 DIAGNOSIS — Z1231 Encounter for screening mammogram for malignant neoplasm of breast: Secondary | ICD-10-CM

## 2020-06-11 ENCOUNTER — Encounter: Payer: Self-pay | Admitting: Internal Medicine

## 2020-06-11 ENCOUNTER — Other Ambulatory Visit: Payer: Self-pay

## 2020-06-11 ENCOUNTER — Ambulatory Visit (INDEPENDENT_AMBULATORY_CARE_PROVIDER_SITE_OTHER): Payer: BC Managed Care – PPO | Admitting: Internal Medicine

## 2020-06-11 VITALS — BP 118/70 | HR 88 | Ht 65.75 in | Wt 157.0 lb

## 2020-06-11 DIAGNOSIS — E042 Nontoxic multinodular goiter: Secondary | ICD-10-CM

## 2020-06-11 DIAGNOSIS — E039 Hypothyroidism, unspecified: Secondary | ICD-10-CM

## 2020-06-11 LAB — T3, FREE: T3, Free: 3.4 pg/mL (ref 2.3–4.2)

## 2020-06-11 LAB — T4, FREE: Free T4: 1 ng/dL (ref 0.60–1.60)

## 2020-06-11 LAB — TSH: TSH: 1.06 u[IU]/mL (ref 0.35–4.50)

## 2020-06-11 NOTE — Progress Notes (Signed)
Patient ID: Sharon Kirk, female   DOB: 17-Oct-1960, 59 y.o.   MRN: [id number]   This visit occurred during the SARS-CoV-2 public health emergency.  Safety protocols were in place, including screening questions prior to the visit, additional usage of staff PPE, and extensive cleaning of exam room while observing appropriate contact time as indicated for disinfecting solutions.   HPI  Sharon Kirk is a 59 y.o.-year-old female, initially referred by her PCP, Elon Alas, NP , returning for follow-up for hypothyroidism and thyroid nodules.   She previously saw Dr. Jeanann Lewandowsky, who retired.  Last visit with me was a year ago.  Reviewed and addended history:: Thyroid nodule biopsy in 06/2010, but I do not have access to review these results (in Dr. Carlis Abbott 's office).  Thyroid ultrasound (05/18/2015): She had a 3.2 cm left thyroid nodule and other smaller thyroid nodules scattered throughout her thyroid.  08/05/2015: FNA of the 3.2 cm thyroid nodule >> benign  Thyroid ultrasound (12/29/2017): Stable 3.3 x 1.6 x 2.2 cm isoechoic left thyroid nodule and stable, smaller, thyroid nodules scattered throughout her thyroid  Thyroid ultrasound (06/19/2019): Stable nodules  Barium swallow (07/04/2019): Mild esophageal motility disorder, with presbyesophagus pattern  In 2014 she was started on levothyroxine 50 mcg daily to shrink the thyroid nodules.  She remained on levothyroxine afterwards. She was on levothyroxine 50 mcg daily, which we were able to decrease and then stop completely in 07/2019.  Subsequent TFTs were normal.  We continued her off levothyroxine.  Reviewed her TFTs: Lab Results  Component Value Date   TSH 1.21 09/16/2019   TSH 1.34 08/05/2019   TSH 0.48 06/12/2019   TSH 0.36 06/11/2018   TSH 0.53 12/12/2017   TSH 0.570 08/25/2016   TSH 0.990 08/14/2012   FREET4 0.85 09/16/2019   FREET4 0.83 08/05/2019   FREET4 1.13 06/12/2019   FREET4 0.90 06/11/2018   FREET4 0.83 12/12/2017    08/2017: normal, reportedly  Pt denies: - feeling nodules in neck - choking - SOB with lying down She has chronic dysphagia and cough and hoarseness later in the day. Barium swallow in 2011>> nonspecific esophageal motility disorder Barium swallow in 06/2019 >> presbyesophagus, mild esophageal motility disorder  She has no FH of thyroid disorders. No FH of thyroid cancer. No h/o radiation tx to head or neck.  No seaweed or kelp. No recent contrast studies. No herbal supplements. No Biotin use. No recent steroids use.   Pt. also has a history of metastatic melanoma in LNs.  She was on Estrogen >> off now, but has hot flushes.  ROS: Constitutional: no weight gain/no weight loss, + fatigue, + subjective hyperthermia, no subjective hypothermia Eyes: no blurry vision, no xerophthalmia ENT: no sore throat, + see HPI Cardiovascular: no CP/no SOB/no palpitations/no leg swelling Respiratory: no cough/no SOB/no wheezing Gastrointestinal: no N/no V/no D/no C/no acid reflux Musculoskeletal: no muscle aches/no joint aches Skin: no rashes, no hair loss Neurological: no tremors/no numbness/no tingling/no dizziness  I reviewed pt's medications, allergies, PMH, social hx, family hx, and changes were documented in the history of present illness. Otherwise, unchanged from my initial visit note.  Past Medical History:  Diagnosis Date  . Allergy   . Cancer ([place])    Melanoma  . Hyperlipidemia   . IBS (irritable bowel syndrome)   . Osteopenia   . PONV (postoperative nausea and vomiting)   . Thyroid nodule    Past Surgical History:  Procedure Laterality Date  . ABDOMINAL HYSTERECTOMY  [place]  . MELANOMA EXCISION Right 05/23/2016   Procedure: WIDE  EXCISION RIGHT SHOULDER MELANOMA AND WIDE EXCISION LEFT LOWER LEG MELANOMA;  Surgeon: Jackolyn Confer, MD;  Location: [facility];  Service: General;  Laterality: Right;  right shoulder and left  lower leg sites  . SENTINEL NODE BIOPSY Left 05/23/2016   Procedure: LEFT INGUINAL SENTINEL LYMPH  NODE BIOPSY;  Surgeon: Jackolyn Confer, MD;  Location: [facility];  Service: General;  Laterality: Left;  . THYROID SURGERY  07/2015   NODULE   Social History   Socioeconomic History  . Marital status: Married    Spouse name: Sharon Kirk  . Number of children: 0  . Years of education: 72  . Highest education level: Not on file  Occupational History  . Occupation: Research Kirk (physical sciences): [employer]  Tobacco Use  . Smoking status: Never Smoker  . Smokeless tobacco: Never Used  Vaping Use  . Vaping Use: Never used  Substance and Sexual Activity  . Alcohol use: No  . Drug use: No  . Sexual activity: Yes    Partners: Male    Birth control/protection: Surgical, None  Other Topics Concern  . Not on file  Social History Narrative   Lives with her husband.   Social Determinants of Health   Financial Resource Strain:   . Difficulty of Paying Living Expenses: Not on file  Food Insecurity:   . Worried About Charity fundraiser in the Last Year: Not on file  . Ran Out of Food in the Last Year: Not on file  Transportation Needs:   . Lack of Transportation (Medical): Not on file  . Lack of Transportation (Non-Medical): Not on file  Physical Activity:   . Days of Exercise per Week: Not on file  . Minutes of Exercise per Session: Not on file  Stress:   . Feeling of Stress : Not on file  Social Connections:   . Frequency of Communication with Friends and Family: Not on file  . Frequency of Social Gatherings with Friends and Family: Not on file  . Attends Religious Services: Not on file  . Active Member of Clubs or Organizations: Not on file  . Attends Archivist Meetings: Not on file  . Marital Status: Not on file  Intimate Partner Violence:   . Fear of Current or Ex-Partner: Not on file  . Emotionally Abused: Not on file  . Physically Abused: Not on  file  . Sexually Abused: Not on file   Current Outpatient Medications on File Prior to Visit  Medication Sig Dispense Refill  . calcium carbonate (OS-CAL) 600 MG TABS Take 600 mg by mouth 2 (two) times daily with a meal.      . cholecalciferol (VITAMIN D) 1000 UNITS tablet Take 5,000 Units by mouth daily.     . Cyanocobalamin (VITAMIN B-12 CR PO) Take 1 tablet by mouth daily.    Marland Kitchen estradiol (ESTRACE) 0.5 MG tablet Take 1 tablet (0.5 mg total) by mouth daily. (Patient not taking: Reported on 06/11/2020) 90 tablet 4   No current facility-administered medications on file prior to visit.   Allergies  Allergen Reactions  . Codeine Nausea Only and Other (See Comments)    Hallucinations; confusion   Family History  Problem Relation Age of Onset  . Diabetes Brother   . Hypertension Brother   . Cancer Maternal Grandmother  COLON  . Cancer Paternal Grandfather        PROSTATE  . Hypertension Mother   . Diabetes Brother   . Heart attack Father     PE: BP 118/70   Pulse 88   Ht 5' 5.75" (1.67 m)   Wt 157 lb (71.2 kg)   LMP 06/30/1990   SpO2 95%   BMI 25.53 kg/m  Wt Readings from Last 3 Encounters:  06/11/20 157 lb (71.2 kg)  09/30/19 158 lb (71.7 kg)  06/12/19 158 lb (71.7 kg)   Constitutional: Slightly overweight, in NAD Eyes: PERRLA, EOMI, no exophthalmos ENT: moist mucous membranes, no thyromegaly, but lumpy bumpy thyroid on palpation, no cervical lymphadenopathy Cardiovascular: RRR, No MRG Respiratory: CTA B Gastrointestinal: abdomen soft, NT, ND, BS+ Musculoskeletal: no deformities, strength intact in all 4 Skin: moist, warm, no rashes Neurological: no tremor with outstretched hands, DTR normal in all 4  ASSESSMENT: 1. Hypothyroidism  2. Thyroid nodules  Thyroid ultrasound (12/29/2017): Parenchymal Echotexture: Moderately heterogenous Isthmus: 7 mm, previously 9 mm Right lobe: 4.7 x 1.2 x 1.2 cm, previously 4.9 x 1.8 x 1.8 cm Left lobe: 5.1 x 2.0 x 2.2 cm,  previously 5.4 x 1.9 x 1.9 cm _____________________________________________________ Nodule # 4: Prior biopsy: Yes Location: Left; Inferior Maximum size: 3.3, previously 3.2 cm; Other 2 dimensions: 1.6 x 2.2 cm, previously, 1.8 x 2.0 cm Composition: solid/almost completely solid (2) Echogenicity: isoechoic (1) Change in features: No *Given size (>/= 2.5 cm) and appearance, fine needle aspiration of this mildly suspicious nodule should be considered based on TI-RADS criteria. Please note nodule has been previously biopsied. Correlate with prior pathology. __________________________________________________  There are additional bilateral subcentimeter cystic and hypoechoic nodules noted all measuring 8 mm or less in size. These would not meet criteria for biopsy or any follow-up. No significant interval change.  No adenopathy  IMPRESSION: 3.3 cm left inferior TR 3 nodule meets criteria for biopsy which has already been performed 08/05/2015. Correlate with prior pathology.  Otherwise stable thyroid ultrasound  Thyroid ultrasound (06/19/2019): Parenchymal Echotexture: Moderately heterogenous Isthmus: Normal in size measuring 0.7 cm in diameter Right lobe: Normal in size measuring 3.6 x 1.2 x 1.4 cm, unchanged, previously, 4.7 x 1.2 x 1.2 cm Left lobe: Borderline enlarged measuring 5.1 x 1.8 x 2.9 cm, unchanged, previously, 5.1 x 2.0 x 2.2 cm _________________________________________________________  The approximately 1.2 x 1.1 x 0.5 cm hypoechoic nodule within the thyroid isthmus (labeled 1) is unchanged compared to the 01/2013 examination, previously, 1.1 x 1.1 x 0.6 cm. Stability for greater than 5 years is indicative of benign etiology. ______________________________________________________  The approximately 0.9 x 0.9 x 0.4 cm hypoechoic nodule within mid aspect the right lobe of the thyroid (labeled 2) is unchanged to decreased in size compared to the 07/2015  examination, previously, 1.6 x 1.1 x 0.9 cm. Stability for greater than 5 years is indicative of benign etiology.  The approximately 0.9 x 0.8 x 0.4 cm isoechoic ill-defined nodule/pseudonodule within the inferior pole of the left lobe of the thyroid (labeled 3) is unchanged compared to the 01/2013 examination, previously, 1.2 x 1.0 x 0.7 cm. Stability for greater than 5 years is indicative of benign etiology.  _________________________________________________________  The previously biopsied approximately 3.4 x 2.9 x 1.8 cm ill-defined hypoechoic nodule/pseudonodule replacing the mid and inferior aspects of the left lobe of the thyroid (labeled 4) are grossly unchanged compared to the 01/2013 examination, previously, 3.3 x 2.3 x 1.8 cm, with size differences likely attributable  to scan plane projection.  IMPRESSION: 1. Similar findings of multinodular goiter. No worrisome new or enlarging thyroid nodules. 2. Previously biopsied nodule within the left lobe of the thyroid (labeled 4), is grossly unchanged compared to the 01/2013 examination. Correlation with prior biopsy results is recommended. Assuming a benign pathologic diagnosis, repeat sampling and/or continued dedicated follow-up is not recommended. 3. The remaining thyroid nodules are unchanged since the 01/2013 examination. Stability for greater than 5 years is indicative of benign etiology and as such, repeat sampling and/or continued dedicated follow-up is not recommended.   Barium swallow (07/04/2019): Normal oral and pharyngeal phases of swallowing, with no laryngeal penetration or tracheobronchial aspiration. No significant barium retention in the pharynx. No evidence of pharyngeal mass, stricture or diverticulum. No evidence of cricopharyngeus muscle dysfunction.  There is mild esophageal dysmotility, characterized proximal escape of the barium bolus in the upper thoracic esophagus and by mild tertiary  contractions. No hiatal hernia. No gastroesophageal reflux elicited, despite provocative maneuvers including Valsalva maneuver and water siphon test. Normal esophageal mucosa, with no evidence of reflux esophagitis. Normal esophageal distensibility, with no evidence of esophageal mass, stricture or ulcer. Barium tablet traversed the esophagus into the stomach without delay.  IMPRESSION: 1. Mild esophageal dysmotility with presbyesophagus pattern. 2. Otherwise normal esophagram. No hiatal hernia. No gastroesophageal reflux elicited. No evidence of esophageal mass, stricture or ulcer.  PLAN:  1. Patient was initially started on levothyroxine therapy to shrink her thyroid nodules, not because her TFTs were abnormal.  She developed mild hypothyroidism afterwards and she remains on low-dose levothyroxine.  However, we were able to decrease the doses of levothyroxine and finally stopped completely in 07/2019.  She had normal TFTs in 09/2019 but did not return for repeat afterwards. -At today's visit, she does not report hypothyroid symptoms -We will recheck her TSH, free T4, free T3 today to see if we need to restart levothyroxine -I did advise her how to take this correctly in case we need to restart:  - in am - fasting - at least 30 min from b'fast - At least 4 hours from Ca, Fe, MVI, PPIs -If labs are normal, she can continue to follow-up with PCP with annual TSH levels  2.  Thyroid nodules -Patient has a long history of thyroid nodules, of which the dominant one in the left lobe was biopsied with benign results in 2016.  We repeated her thyroid ultrasound in 2019 and the nodule was stable.  It appeared isoechoic, which comfers a low risk for cancer.  We also repeated another ultrasound after last visit and the thyroid nodule again appeared stable.  Stability over 5 years is a good indication for benignity.  She also has several smaller thyroid nodules which were stable over the last 5 years  so no ultrasound follow-up is needed for these. -She continues to have some mild neck compression symptoms but not enough to require surgery (hoarseness later in the day and also some dysphagia) -We reviewed together her barium swallow obtained in 06/2019.  This showed mild esophageal dysmotility with presbyesophagus but no compression from the thyroid.  In this case, we discussed that her dysphagia does not appear to be caused by her thyroid nodules.  No further follow-up is needed for this  Component     Latest Ref Rng & Units 06/11/2020  TSH     0.35 - 4.50 uIU/mL 1.06  T4,Free(Direct)     0.60 - 1.60 ng/dL 1.00  Triiodothyronine,Free,Serum     2.3 -  4.2 pg/mL 3.4  Thyroid tests are normal.  Philemon Kingdom, MD PhD [facility] Endocrinology

## 2020-06-11 NOTE — Patient Instructions (Signed)
Please stop at the lab.  If we need to restart the levothyroxine, please take the thyroid hormone every day, with water, at least 30 minutes before breakfast, separated by at least 4 hours from: - acid reflux medications - calcium - iron - multivitamins  From now on, you can continue to follow-up with your primary care doctor with annual TSH levels.

## 2020-07-09 DIAGNOSIS — Z20822 Contact with and (suspected) exposure to covid-19: Secondary | ICD-10-CM | POA: Diagnosis not present

## 2020-07-09 DIAGNOSIS — R0981 Nasal congestion: Secondary | ICD-10-CM | POA: Diagnosis not present

## 2020-07-30 DIAGNOSIS — R059 Cough, unspecified: Secondary | ICD-10-CM | POA: Diagnosis not present

## 2020-07-30 DIAGNOSIS — R6883 Chills (without fever): Secondary | ICD-10-CM | POA: Diagnosis not present

## 2020-07-30 DIAGNOSIS — R52 Pain, unspecified: Secondary | ICD-10-CM | POA: Diagnosis not present

## 2020-07-30 DIAGNOSIS — Z20822 Contact with and (suspected) exposure to covid-19: Secondary | ICD-10-CM | POA: Diagnosis not present

## 2020-08-03 ENCOUNTER — Other Ambulatory Visit: Payer: Self-pay | Admitting: Nurse Practitioner

## 2020-08-03 DIAGNOSIS — E785 Hyperlipidemia, unspecified: Secondary | ICD-10-CM

## 2020-08-03 DIAGNOSIS — U071 COVID-19: Secondary | ICD-10-CM

## 2020-08-03 DIAGNOSIS — Z85820 Personal history of malignant melanoma of skin: Secondary | ICD-10-CM

## 2020-08-03 DIAGNOSIS — Z6825 Body mass index (BMI) 25.0-25.9, adult: Secondary | ICD-10-CM

## 2020-08-03 NOTE — Progress Notes (Signed)
I connected by phone with Sharon Kirk on 08/03/2020 at 1:39 PM to discuss the potential use of a new treatment for mild to moderate COVID-19 viral infection in non-hospitalized patients.  This patient is a 59 y.o. female that meets the FDA criteria for Emergency Use Authorization of COVID monoclonal antibody casirivimab/imdevimab.  Has a (+) direct SARS-CoV-2 viral test result  Has mild or moderate COVID-19   Is NOT hospitalized due to COVID-19  Is within 10 days of symptom onset  Has at least one of the high risk factor(s) for progression to severe COVID-19 and/or hospitalization as defined in EUA.  Specific high risk criteria : BMI > 25, Cardiovascular disease or hypertension and Other high risk medical condition per CDC:  history of Melanoma   I have spoken and communicated the following to the patient or parent/caregiver regarding COVID monoclonal antibody treatment:  1. FDA has authorized the emergency use for the treatment of mild to moderate COVID-19 in adults and pediatric patients with positive results of direct SARS-CoV-2 viral testing who are 85 years of age and older weighing at least 40 kg, and who are at high risk for progressing to severe COVID-19 and/or hospitalization.  2. The significant known and potential risks and benefits of COVID monoclonal antibody, and the extent to which such potential risks and benefits are unknown.  3. Information on available alternative treatments and the risks and benefits of those alternatives, including clinical trials.  4. Patients treated with COVID monoclonal antibody should continue to self-isolate and use infection control measures (e.g., wear mask, isolate, social distance, avoid sharing personal items, clean and disinfect "high touch" surfaces, and frequent handwashing) according to CDC guidelines.   5. The patient or parent/caregiver has the option to accept or refuse COVID monoclonal antibody treatment.  After reviewing this  information with the patient, the patient has agreed to receive one of the available covid 19 monoclonal antibodies and will be provided an appropriate fact sheet prior to infusion.  Sx onset 07/28/20. Set up for infusion on 08/04/20 at 8:30 am. Directions given to [facility]. Pt is aware that insurance will be charged an infusion fee.   [name] 08/03/2020 1:39 PM

## 2020-08-04 ENCOUNTER — Ambulatory Visit (HOSPITAL_COMMUNITY)
Admission: RE | Admit: 2020-08-04 | Discharge: 2020-08-04 | Disposition: A | Discharge status: Home / Self Care | Payer: BC Managed Care – PPO | Source: Ambulatory Visit | Attending: Pulmonary Disease | Admitting: Pulmonary Disease

## 2020-08-04 DIAGNOSIS — Z85820 Personal history of malignant melanoma of skin: Secondary | ICD-10-CM | POA: Insufficient documentation

## 2020-08-04 DIAGNOSIS — E785 Hyperlipidemia, unspecified: Secondary | ICD-10-CM | POA: Insufficient documentation

## 2020-08-04 DIAGNOSIS — Z6825 Body mass index (BMI) 25.0-25.9, adult: Secondary | ICD-10-CM

## 2020-08-04 DIAGNOSIS — U071 COVID-19: Secondary | ICD-10-CM | POA: Insufficient documentation

## 2020-08-04 MED ORDER — ALBUTEROL SULFATE HFA 108 (90 BASE) MCG/ACT IN AERS: 2.0000 | INHALATION_SPRAY | Freq: Once | RESPIRATORY_TRACT | Status: DC | PRN

## 2020-08-04 MED ORDER — DIPHENHYDRAMINE HCL 50 MG/ML IJ SOLN: 50.0000 mg | Freq: Once | INTRAMUSCULAR | Status: DC | PRN

## 2020-08-04 MED ORDER — FAMOTIDINE IN NACL 20-0.9 MG/50ML-% IV SOLN: 20.0000 mg | Freq: Once | INTRAVENOUS | Status: DC | PRN

## 2020-08-04 MED ORDER — METHYLPREDNISOLONE SODIUM SUCC 125 MG IJ SOLR: 125.0000 mg | Freq: Once | INTRAMUSCULAR | Status: DC | PRN

## 2020-08-04 MED ORDER — SODIUM CHLORIDE 0.9 % IV SOLN: Freq: Once | INTRAVENOUS | Status: AC

## 2020-08-04 MED ORDER — EPINEPHRINE 0.3 MG/0.3ML IJ SOAJ: 0.3000 mg | Freq: Once | INTRAMUSCULAR | Status: DC | PRN

## 2020-08-04 MED ORDER — SODIUM CHLORIDE 0.9 % IV SOLN: INTRAVENOUS | Status: DC | PRN

## 2020-08-04 NOTE — Discharge Instructions (Signed)

## 2020-08-04 NOTE — Progress Notes (Signed)
  Diagnosis: COVID-19  Physician: Dr Wright  Procedure: [facility] Infusion [facility] Med: bamlanivimab\etesevimab infusion - Provided patient with bamlanimivab\etesevimab fact sheet for patients, parents and caregivers prior to infusion.  Complications: No immediate complications noted.  Discharge: Discharged home   Sharon Kirk 08/04/2020  

## 2020-08-05 DIAGNOSIS — U071 COVID-19: Secondary | ICD-10-CM | POA: Diagnosis not present

## 2020-08-10 ENCOUNTER — Encounter (HOSPITAL_COMMUNITY): Payer: Self-pay

## 2020-08-10 ENCOUNTER — Inpatient Hospital Stay (HOSPITAL_COMMUNITY)
Admission: EM | Admit: 2020-08-10 | Discharge: 2020-08-24 | DRG: 177 | Disposition: A | Discharge status: Home Health | Payer: BC Managed Care – PPO | Attending: Internal Medicine | Admitting: Internal Medicine

## 2020-08-10 ENCOUNTER — Inpatient Hospital Stay (HOSPITAL_COMMUNITY): Payer: BC Managed Care – PPO

## 2020-08-10 ENCOUNTER — Emergency Department (HOSPITAL_COMMUNITY): Payer: BC Managed Care – PPO

## 2020-08-10 ENCOUNTER — Other Ambulatory Visit: Payer: Self-pay

## 2020-08-10 ENCOUNTER — Inpatient Hospital Stay (HOSPITAL_COMMUNITY)
Admit: 2020-08-10 | Discharge: 2020-08-10 | Disposition: A | Discharge status: Home / Self Care | Payer: BC Managed Care – PPO | Attending: Specialist | Admitting: Specialist

## 2020-08-10 DIAGNOSIS — Z9889 Other specified postprocedural states: Secondary | ICD-10-CM

## 2020-08-10 DIAGNOSIS — J90 Pleural effusion, not elsewhere classified: Secondary | ICD-10-CM | POA: Diagnosis not present

## 2020-08-10 DIAGNOSIS — D649 Anemia, unspecified: Secondary | ICD-10-CM | POA: Diagnosis not present

## 2020-08-10 DIAGNOSIS — J9601 Acute respiratory failure with hypoxia: Secondary | ICD-10-CM

## 2020-08-10 DIAGNOSIS — I2729 Other secondary pulmonary hypertension: Secondary | ICD-10-CM | POA: Diagnosis present

## 2020-08-10 DIAGNOSIS — U071 COVID-19: Secondary | ICD-10-CM | POA: Diagnosis not present

## 2020-08-10 DIAGNOSIS — E222 Syndrome of inappropriate secretion of antidiuretic hormone: Secondary | ICD-10-CM | POA: Diagnosis present

## 2020-08-10 DIAGNOSIS — R739 Hyperglycemia, unspecified: Secondary | ICD-10-CM | POA: Diagnosis not present

## 2020-08-10 DIAGNOSIS — J189 Pneumonia, unspecified organism: Secondary | ICD-10-CM | POA: Diagnosis not present

## 2020-08-10 DIAGNOSIS — J80 Acute respiratory distress syndrome: Secondary | ICD-10-CM

## 2020-08-10 DIAGNOSIS — E872 Acidosis: Secondary | ICD-10-CM | POA: Diagnosis present

## 2020-08-10 DIAGNOSIS — I452 Bifascicular block: Secondary | ICD-10-CM | POA: Diagnosis not present

## 2020-08-10 DIAGNOSIS — Z6827 Body mass index (BMI) 27.0-27.9, adult: Secondary | ICD-10-CM

## 2020-08-10 DIAGNOSIS — R0603 Acute respiratory distress: Secondary | ICD-10-CM | POA: Diagnosis not present

## 2020-08-10 DIAGNOSIS — E871 Hypo-osmolality and hyponatremia: Secondary | ICD-10-CM | POA: Diagnosis not present

## 2020-08-10 DIAGNOSIS — O2230 Deep phlebothrombosis in pregnancy, unspecified trimester: Secondary | ICD-10-CM | POA: Diagnosis not present

## 2020-08-10 DIAGNOSIS — Z885 Allergy status to narcotic agent status: Secondary | ICD-10-CM | POA: Diagnosis not present

## 2020-08-10 DIAGNOSIS — E041 Nontoxic single thyroid nodule: Secondary | ICD-10-CM | POA: Diagnosis not present

## 2020-08-10 DIAGNOSIS — Z833 Family history of diabetes mellitus: Secondary | ICD-10-CM

## 2020-08-10 DIAGNOSIS — E860 Dehydration: Secondary | ICD-10-CM | POA: Diagnosis present

## 2020-08-10 DIAGNOSIS — J811 Chronic pulmonary edema: Secondary | ICD-10-CM | POA: Diagnosis not present

## 2020-08-10 DIAGNOSIS — R5381 Other malaise: Secondary | ICD-10-CM | POA: Diagnosis present

## 2020-08-10 DIAGNOSIS — I50811 Acute right heart failure: Secondary | ICD-10-CM | POA: Diagnosis present

## 2020-08-10 DIAGNOSIS — I509 Heart failure, unspecified: Secondary | ICD-10-CM | POA: Diagnosis not present

## 2020-08-10 DIAGNOSIS — E861 Hypovolemia: Secondary | ICD-10-CM | POA: Diagnosis present

## 2020-08-10 DIAGNOSIS — R0902 Hypoxemia: Secondary | ICD-10-CM | POA: Diagnosis not present

## 2020-08-10 DIAGNOSIS — I2781 Cor pulmonale (chronic): Secondary | ICD-10-CM | POA: Diagnosis present

## 2020-08-10 DIAGNOSIS — E785 Hyperlipidemia, unspecified: Secondary | ICD-10-CM | POA: Diagnosis present

## 2020-08-10 DIAGNOSIS — E876 Hypokalemia: Secondary | ICD-10-CM | POA: Diagnosis present

## 2020-08-10 DIAGNOSIS — E663 Overweight: Secondary | ICD-10-CM | POA: Diagnosis present

## 2020-08-10 DIAGNOSIS — I82442 Acute embolism and thrombosis of left tibial vein: Secondary | ICD-10-CM | POA: Diagnosis not present

## 2020-08-10 DIAGNOSIS — D72829 Elevated white blood cell count, unspecified: Secondary | ICD-10-CM | POA: Diagnosis present

## 2020-08-10 DIAGNOSIS — R0602 Shortness of breath: Secondary | ICD-10-CM | POA: Diagnosis not present

## 2020-08-10 DIAGNOSIS — Z79899 Other long term (current) drug therapy: Secondary | ICD-10-CM

## 2020-08-10 DIAGNOSIS — R0600 Dyspnea, unspecified: Secondary | ICD-10-CM | POA: Diagnosis not present

## 2020-08-10 DIAGNOSIS — Z85820 Personal history of malignant melanoma of skin: Secondary | ICD-10-CM | POA: Diagnosis not present

## 2020-08-10 DIAGNOSIS — I272 Pulmonary hypertension, unspecified: Secondary | ICD-10-CM | POA: Diagnosis not present

## 2020-08-10 DIAGNOSIS — R7989 Other specified abnormal findings of blood chemistry: Secondary | ICD-10-CM | POA: Diagnosis not present

## 2020-08-10 DIAGNOSIS — Z90710 Acquired absence of both cervix and uterus: Secondary | ICD-10-CM | POA: Diagnosis not present

## 2020-08-10 DIAGNOSIS — J1282 Pneumonia due to coronavirus disease 2019: Secondary | ICD-10-CM | POA: Diagnosis not present

## 2020-08-10 DIAGNOSIS — R918 Other nonspecific abnormal finding of lung field: Secondary | ICD-10-CM | POA: Diagnosis not present

## 2020-08-10 DIAGNOSIS — I82409 Acute embolism and thrombosis of unspecified deep veins of unspecified lower extremity: Secondary | ICD-10-CM

## 2020-08-10 DIAGNOSIS — R091 Pleurisy: Secondary | ICD-10-CM | POA: Diagnosis not present

## 2020-08-10 DIAGNOSIS — Z8249 Family history of ischemic heart disease and other diseases of the circulatory system: Secondary | ICD-10-CM

## 2020-08-10 LAB — CBC WITH DIFFERENTIAL/PLATELET
Abs Immature Granulocytes: 0.29 10*3/uL — ABNORMAL HIGH (ref 0.00–0.07)
Basophils Absolute: 0 10*3/uL (ref 0.0–0.1)
Basophils Relative: 0 %
Eosinophils Absolute: 0 10*3/uL (ref 0.0–0.5)
Eosinophils Relative: 0 %
HCT: 30.5 % — ABNORMAL LOW (ref 36.0–46.0)
Hemoglobin: 11.2 g/dL — ABNORMAL LOW (ref 12.0–15.0)
Immature Granulocytes: 2 %
Lymphocytes Relative: 4 %
Lymphs Abs: 0.7 10*3/uL (ref 0.7–4.0)
MCH: 28.4 pg (ref 26.0–34.0)
MCHC: 36.7 g/dL — ABNORMAL HIGH (ref 30.0–36.0)
MCV: 77.2 fL — ABNORMAL LOW (ref 80.0–100.0)
Monocytes Absolute: 0.3 10*3/uL (ref 0.1–1.0)
Monocytes Relative: 2 %
Neutro Abs: 13.8 10*3/uL — ABNORMAL HIGH (ref 1.7–7.7)
Neutrophils Relative %: 92 %
Platelets: 305 10*3/uL (ref 150–400)
RBC: 3.95 MIL/uL (ref 3.87–5.11)
RDW: 12.7 % (ref 11.5–15.5)
WBC: 15 10*3/uL — ABNORMAL HIGH (ref 4.0–10.5)
nRBC: 0.1 % (ref 0.0–0.2)

## 2020-08-10 LAB — BASIC METABOLIC PANEL
Anion gap: 10 (ref 5–15)
Anion gap: 14 (ref 5–15)
Anion gap: 12 (ref 5–15)
BUN: 6 mg/dL — ABNORMAL LOW (ref 8–23)
BUN: 6 mg/dL — ABNORMAL LOW (ref 8–23)
BUN: 10 mg/dL (ref 8–23)
CO2: 24 mmol/L (ref 22–32)
CO2: 29 mmol/L (ref 22–32)
CO2: 28 mmol/L (ref 22–32)
Calcium: 7.1 mg/dL — ABNORMAL LOW (ref 8.9–10.3)
Calcium: 8.4 mg/dL — ABNORMAL LOW (ref 8.9–10.3)
Calcium: 8.2 mg/dL — ABNORMAL LOW (ref 8.9–10.3)
Chloride: 76 mmol/L — ABNORMAL LOW (ref 98–111)
Chloride: 83 mmol/L — ABNORMAL LOW (ref 98–111)
Chloride: 89 mmol/L — ABNORMAL LOW (ref 98–111)
Creatinine, Ser: <0.3 mg/dL — ABNORMAL LOW (ref 0.44–1.00)
Creatinine, Ser: 0.43 mg/dL — ABNORMAL LOW (ref 0.44–1.00)
Creatinine, Ser: 0.34 mg/dL — ABNORMAL LOW (ref 0.44–1.00)
GFR, Estimated: >60 mL/min (ref >60)
GFR, Estimated: >60 mL/min (ref >60)
Glucose, Bld: 105 mg/dL — ABNORMAL HIGH (ref 70–99)
Glucose, Bld: 107 mg/dL — ABNORMAL HIGH (ref 70–99)
Glucose, Bld: 111 mg/dL — ABNORMAL HIGH (ref 70–99)
Potassium: 3.1 mmol/L — ABNORMAL LOW (ref 3.5–5.1)
Potassium: 3.4 mmol/L — ABNORMAL LOW (ref 3.5–5.1)
Potassium: 2.9 mmol/L — ABNORMAL LOW (ref 3.5–5.1)
Sodium: 110 mmol/L — CL (ref 135–145)
Sodium: 126 mmol/L — ABNORMAL LOW (ref 135–145)
Sodium: 129 mmol/L — ABNORMAL LOW (ref 135–145)

## 2020-08-10 LAB — COMPREHENSIVE METABOLIC PANEL
ALT: 37 U/L (ref 0–44)
AST: 29 U/L (ref 15–41)
Albumin: 2.6 g/dL — ABNORMAL LOW (ref 3.5–5.0)
Alkaline Phosphatase: 125 U/L (ref 38–126)
Anion gap: 15 (ref 5–15)
BUN: 8 mg/dL (ref 8–23)
CO2: 24 mmol/L (ref 22–32)
Calcium: 7.9 mg/dL — ABNORMAL LOW (ref 8.9–10.3)
Chloride: 70 mmol/L — ABNORMAL LOW (ref 98–111)
Creatinine, Ser: 0.39 mg/dL — ABNORMAL LOW (ref 0.44–1.00)
GFR, Estimated: >60 mL/min (ref >60)
Glucose, Bld: 141 mg/dL — ABNORMAL HIGH (ref 70–99)
Potassium: 3.4 mmol/L — ABNORMAL LOW (ref 3.5–5.1)
Sodium: 109 mmol/L — CL (ref 135–145)
Total Bilirubin: 1 mg/dL (ref 0.3–1.2)
Total Protein: 6.3 g/dL — ABNORMAL LOW (ref 6.5–8.1)

## 2020-08-10 LAB — MRSA PCR SCREENING: MRSA by PCR: NEGATIVE

## 2020-08-10 LAB — LACTATE DEHYDROGENASE
LDH: 456 U/L — ABNORMAL HIGH (ref 98–192)
LDH: 476 U/L — ABNORMAL HIGH (ref 98–192)

## 2020-08-10 LAB — CHOLESTEROL, TOTAL: Cholesterol: 146 mg/dL (ref 0–200)

## 2020-08-10 LAB — ECHOCARDIOGRAM COMPLETE
Area-P 1/2: 3.66 cm2
Calc EF: 60.8 %
Height: 65 in
S' Lateral: 2.6 cm
Single Plane A2C EF: 51 %
Single Plane A4C EF: 66.1 %
Weight: 2528 oz

## 2020-08-10 LAB — BODY FLUID CELL COUNT WITH DIFFERENTIAL
Lymphs, Fluid: 23 %
Monocyte-Macrophage-Serous Fluid: 11 % — ABNORMAL LOW (ref 50–90)
Neutrophil Count, Fluid: 66 % — ABNORMAL HIGH (ref 0–25)
Total Nucleated Cell Count, Fluid: 125 cu mm (ref 0–1000)

## 2020-08-10 LAB — LACTIC ACID, PLASMA
Lactic Acid, Venous: 3.5 mmol/L (ref 0.5–1.9)
Lactic Acid, Venous: 1.5 mmol/L (ref 0.5–1.9)

## 2020-08-10 LAB — TRIGLYCERIDES: Triglycerides: 86 mg/dL (ref <150)

## 2020-08-10 LAB — PROTEIN, PLEURAL OR PERITONEAL FLUID: Total protein, fluid: <3 g/dL

## 2020-08-10 LAB — BRAIN NATRIURETIC PEPTIDE: B Natriuretic Peptide: 859.5 pg/mL — ABNORMAL HIGH (ref 0.0–100.0)

## 2020-08-10 LAB — HEPARIN LEVEL (UNFRACTIONATED): Heparin Unfractionated: 0.15 IU/mL — ABNORMAL LOW (ref 0.30–0.70)

## 2020-08-10 LAB — GLUCOSE, PLEURAL OR PERITONEAL FLUID: Glucose, Fluid: 110 mg/dL

## 2020-08-10 LAB — C-REACTIVE PROTEIN: CRP: 13.3 mg/dL — ABNORMAL HIGH

## 2020-08-10 LAB — D-DIMER, QUANTITATIVE: D-Dimer, Quant: >20 ug/mL-FEU — ABNORMAL HIGH (ref 0.00–0.50)

## 2020-08-10 LAB — STREP PNEUMONIAE URINARY ANTIGEN: Strep Pneumo Urinary Antigen: NEGATIVE

## 2020-08-10 LAB — LACTATE DEHYDROGENASE, PLEURAL OR PERITONEAL FLUID: LD, Fluid: 215 U/L — ABNORMAL HIGH (ref 3–23)

## 2020-08-10 LAB — PROCALCITONIN
Procalcitonin: 0.31 ng/mL
Procalcitonin: 0.32 ng/mL

## 2020-08-10 LAB — HIV ANTIBODY (ROUTINE TESTING W REFLEX): HIV Screen 4th Generation wRfx: NONREACTIVE

## 2020-08-10 LAB — FERRITIN: Ferritin: 310 ng/mL — ABNORMAL HIGH (ref 11–307)

## 2020-08-10 LAB — FIBRINOGEN: Fibrinogen: 462 mg/dL (ref 210–475)

## 2020-08-10 LAB — PROTEIN, TOTAL: Total Protein: 6 g/dL — ABNORMAL LOW (ref 6.5–8.1)

## 2020-08-10 IMAGING — DX DG CHEST 1V PORT
1 series · 1 of 1 positions shown · non-contrast
Comparison: Chest radiograph and chest CT [DATE]

CLINICAL DATA: Status post thoracentesis

EXAM:
PORTABLE CHEST 1 VIEW

[chest ap]
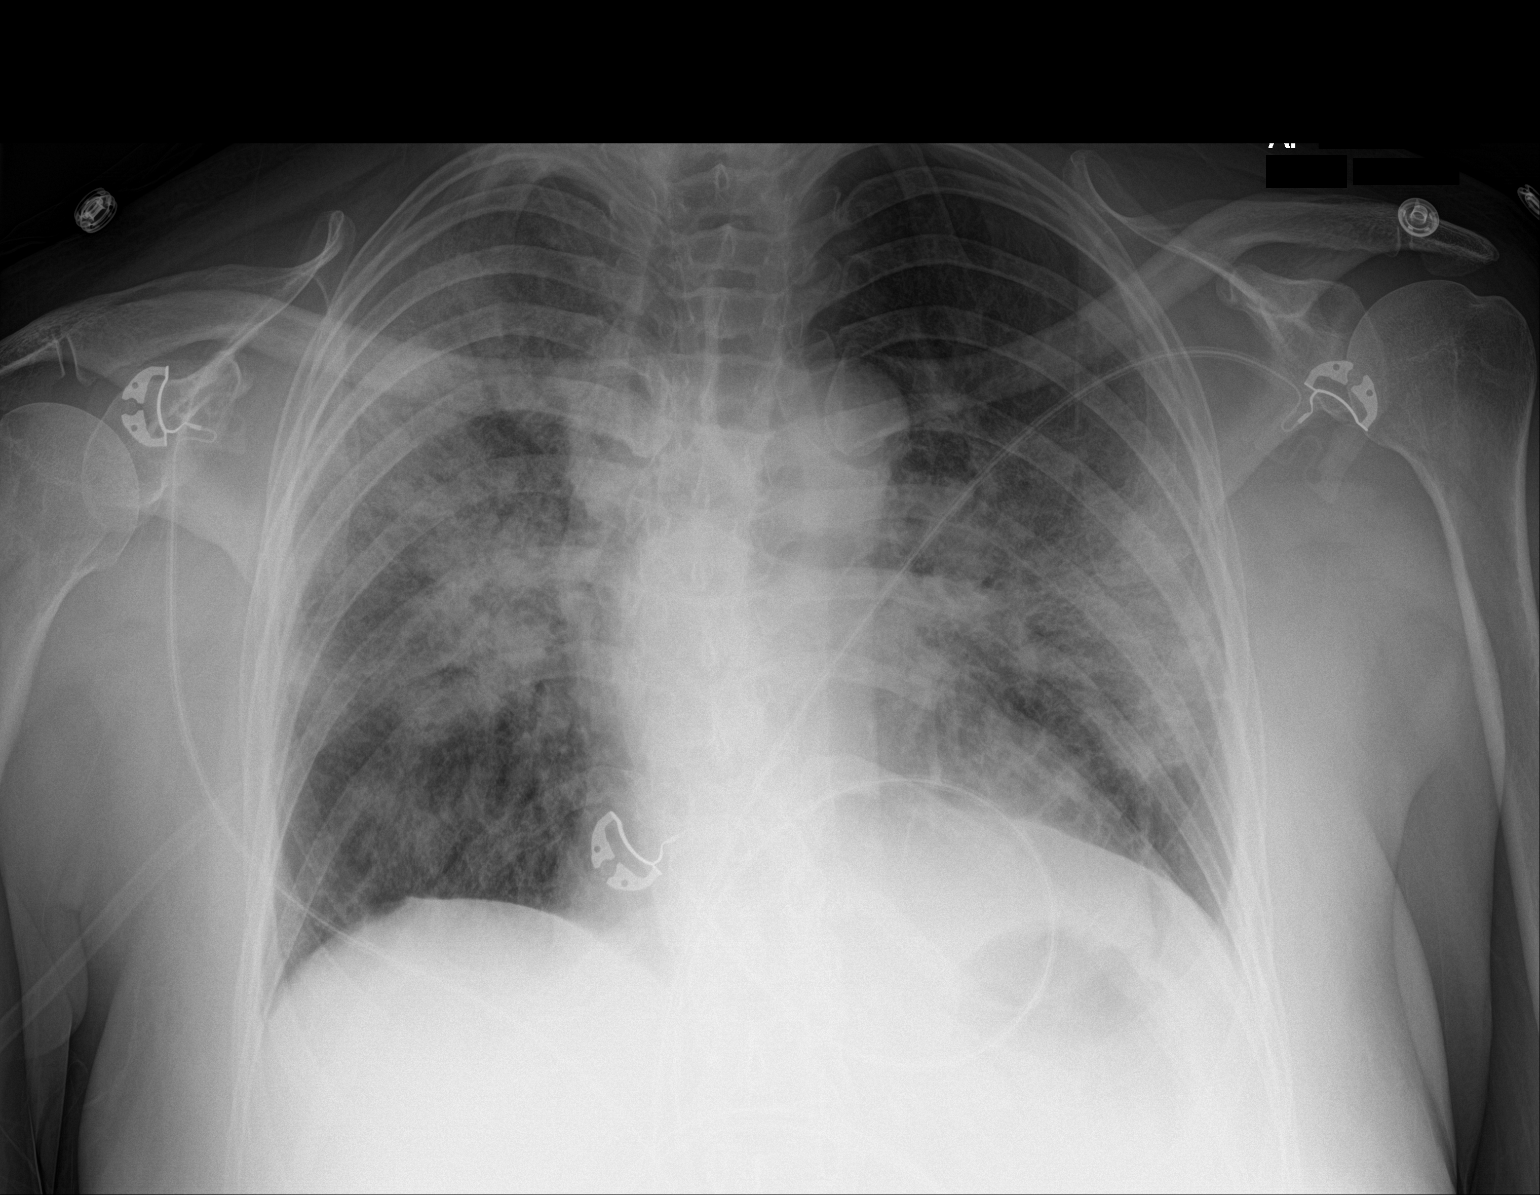

[1 of 1 positions shown; findings below may reference images not displayed]

FINDINGS: No appreciable pneumothorax. No appreciable pleural effusion noted
post thoracentesis. Airspace opacity persists throughout portions of
both mid and lower lung regions as well as the right upper lobe.
Heart size and pulmonary vascularity normal. No adenopathy. No bone
lesions.
IMPRESSION: No pneumothorax. No appreciable pleural effusion. Multifocal
airspace opacity remains in both lungs consistent with multifocal
pneumonia. A degree of superimposed pulmonary edema cannot be
excluded. No new opacity evident. Cardiac silhouette within normal
limits.

## 2020-08-10 IMAGING — CT CT ANGIO CHEST
2 of 6 series · 18 of 46 positions shown · IV contrast (OMNIPAQUE)
Comparison: None.

CLINICAL DATA: COVID positive. Shortness of breath. Elevated
D-dimer. Pulmonary embolus suspected.

EXAM:
CT ANGIOGRAPHY CHEST WITH CONTRAST
TECHNIQUE: Multidetector CT imaging of the chest was performed using the
standard protocol during bolus administration of intravenous
contrast. Multiplanar CT image reconstructions and MIPs were
obtained to evaluate the vascular anatomy.
CONTRAST:  100mL OMNIPAQUE IOHEXOL 350 MG/ML SOLN

[Series 6: thins · axial · 0.60mm/px · z∈[+1470,+1729]mm · 16 of 285 slices shown]
[im 13/285  lung]
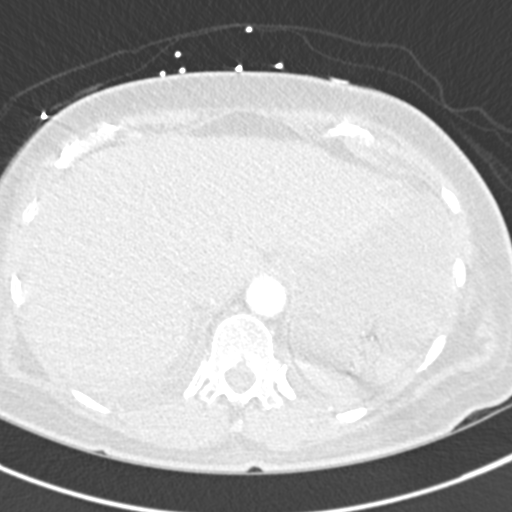
[im 38/285  soft-tissue]
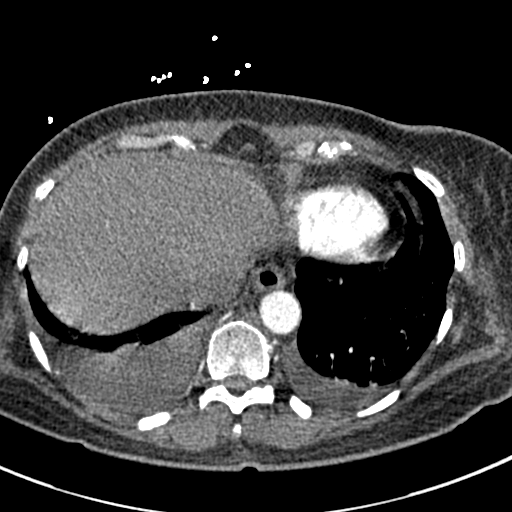
[im 50/285  lung]
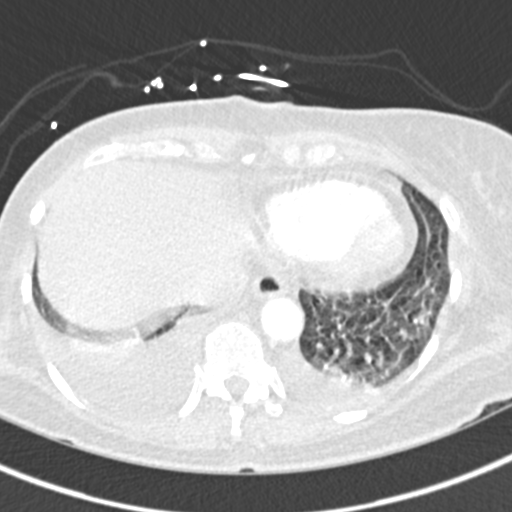
[im 62/285  soft-tissue]
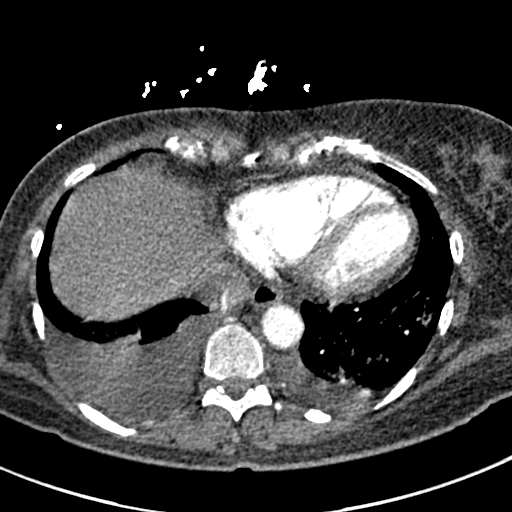
[im 87/285  lung]
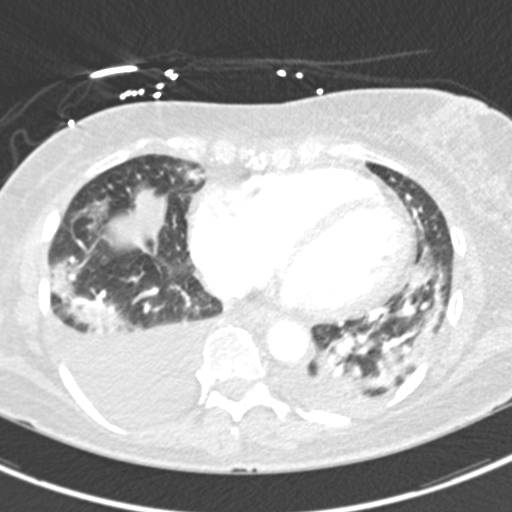
[im 99/285  soft-tissue]
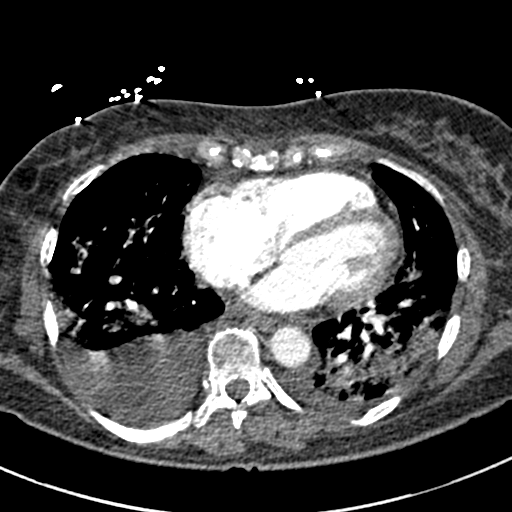
[im 112/285  lung]
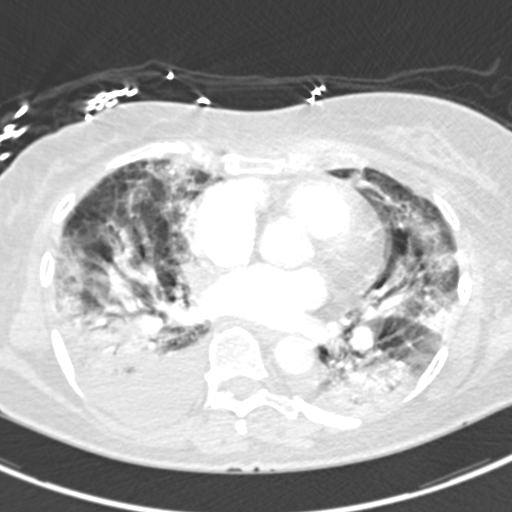
[im 136/285  soft-tissue]
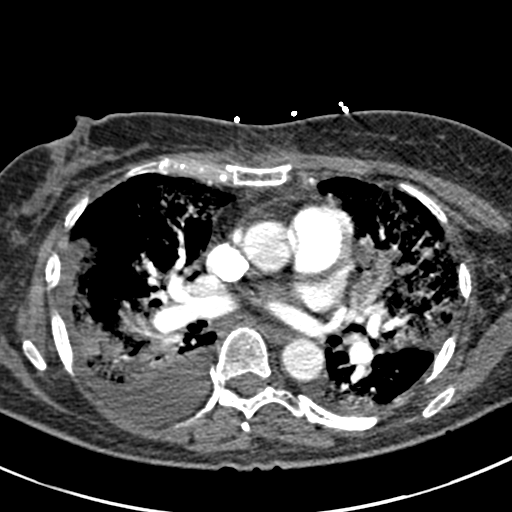
[im 149/285  lung]
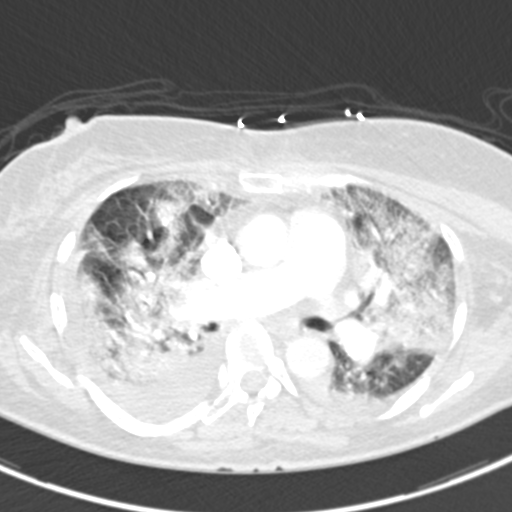
[im 173/285  soft-tissue]
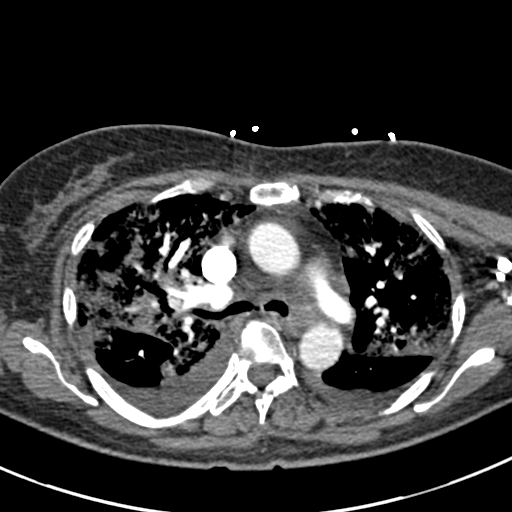
[im 186/285  lung]
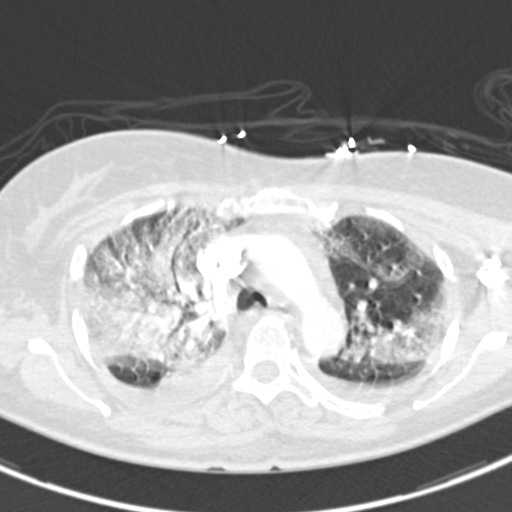
[im 198/285  soft-tissue]
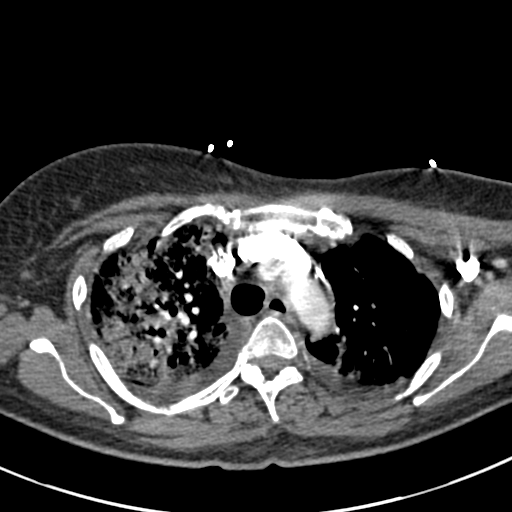
[im 223/285  lung]
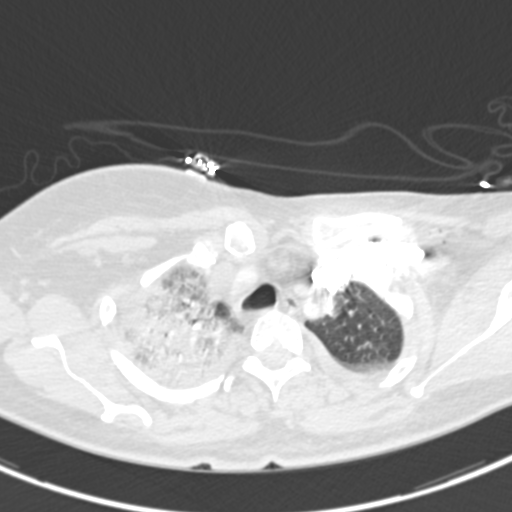
[im 235/285  soft-tissue]
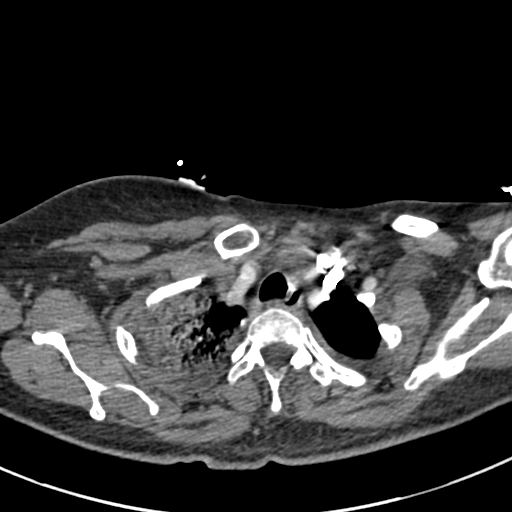
[im 247/285  lung]
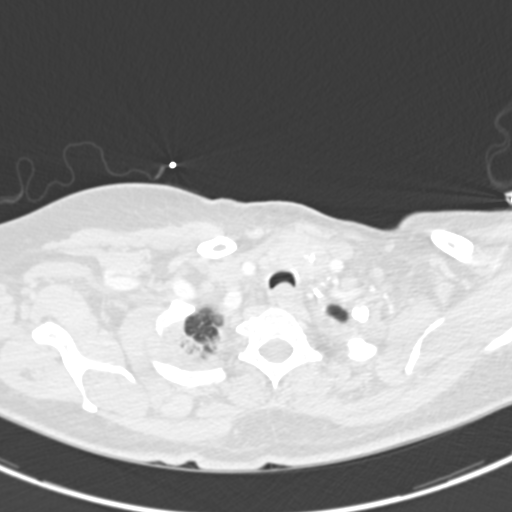
[im 272/285  soft-tissue]
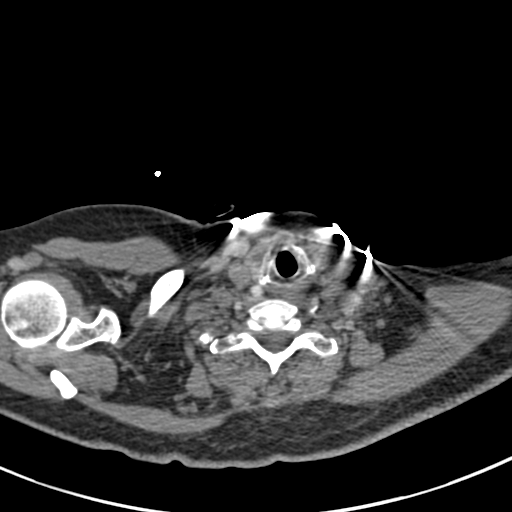

[Series 8: coronal mpr · coronal · 0.56mm/px · 2 of 69 slices shown]
[im 23/69  soft-tissue]
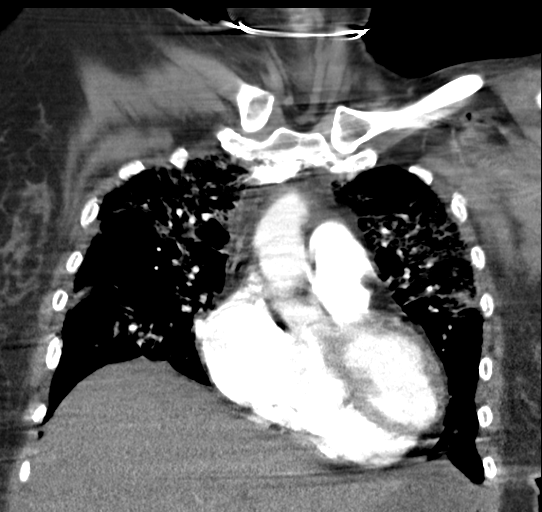
[im 46/69  soft-tissue]
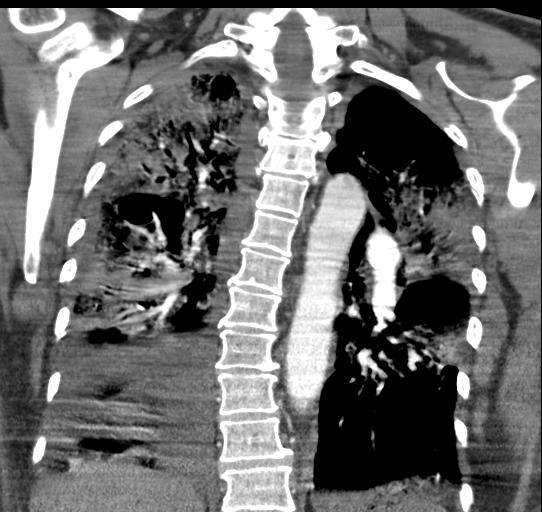

[18 of 46 positions shown; findings below may reference images not displayed]

FINDINGS: Cardiovascular: Heart size upper normal. No substantial pericardial
effusion. No thoracic aortic aneurysm. No large central pulmonary
embolus. No evidence for filling defect in segmental or subsegmental
pulmonary arteries although given the substantial motion degradation
on the current exam, assessment is unreliable, especially in the
lower lobes.

Mediastinum/Nodes: 2.5 cm left thyroid nodule evident. Scattered
upper normal mediastinal lymph nodes evident. The esophagus has
normal imaging features. There is no axillary lymphadenopathy.

Lungs/Pleura: Diffuse areas of patchy ground-glass opacity are noted
in the lungs bilaterally with superimposed areas of confluent
consolidative lung disease, most prominently in the right upper
lobe, lingula, and right lower lobe. Moderate right and small left
pleural effusions associated.

Upper Abdomen: Unremarkable.

Musculoskeletal: No worrisome lytic or sclerotic osseous
abnormality. Diffuse body wall edema evident.

Review of the MIP images confirms the above findings.
IMPRESSION: 1. No CT evidence for large central pulmonary embolus. Assessment of
segmental or subsegmental pulmonary arteries to the lower lobes is
unreliable due to substantial motion degradation.
2. Diffuse areas of patchy ground-glass opacity in the lungs
bilaterally with superimposed areas of confluent consolidative lung
disease, most prominently in the right upper lobe, lingula, and
right lower lobe. Imaging features compatible with multifocal
pneumonia.
3. Moderate right and small left pleural effusions.
4. 2.5 cm left thyroid nodule. This has been evaluated on previous
imaging. (ref: [HOSPITAL]. [DATE]): 143-50).
5. Diffuse body wall edema.

## 2020-08-10 IMAGING — DX DG CHEST 1V PORT
1 series · 1 of 1 positions shown · non-contrast
Comparison: None.

CLINICAL DATA: Shortness of breath, covid positive

EXAM:
PORTABLE CHEST 1 VIEW

[chest ap]
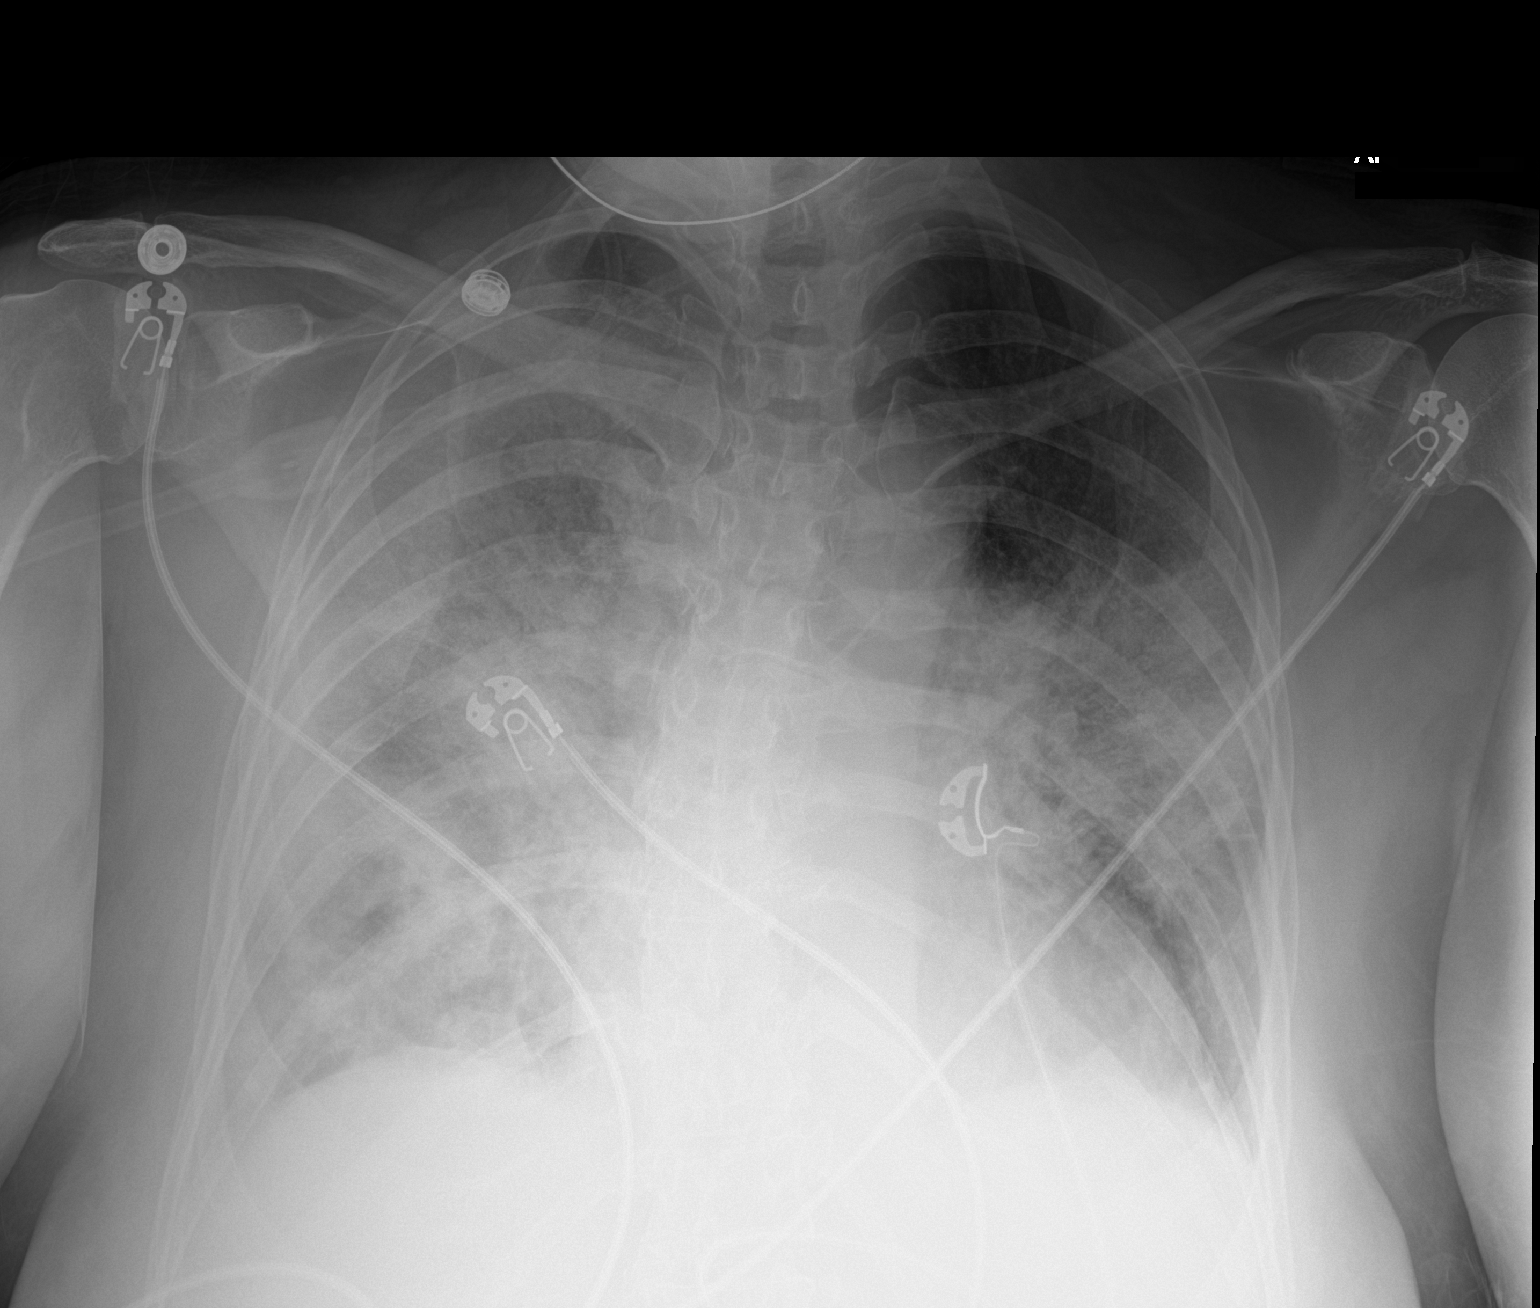

[1 of 1 positions shown; findings below may reference images not displayed]

FINDINGS: The heart size and mediastinal contours are partially obscured. Hazy
patchy airspace opacities are seen throughout both lungs, right
worse than left. Trace bilateral pleural effusions are present. No
acute osseous abnormality.
IMPRESSION: Extensive bilateral airspace opacities, right greater than left,
consistent with atypical viral pneumonia

Trace bilateral pleural effusions

## 2020-08-10 MED ORDER — SODIUM CHLORIDE 0.9 % IV SOLN
2.0000 g | INTRAVENOUS | Status: DC
  Administered 2020-08-10 – 2020-08-11 (×2): 2 g via INTRAVENOUS
  Filled 2020-08-10: qty 20
  Filled 2020-08-10: qty 2

## 2020-08-10 MED ORDER — SODIUM CHLORIDE (PF) 0.9 % IJ SOLN
INTRAMUSCULAR | Status: AC
  Filled 2020-08-10: qty 50

## 2020-08-10 MED ORDER — SODIUM CHLORIDE 0.9 % IV SOLN
500.0000 mg | INTRAVENOUS | Status: DC
  Administered 2020-08-10 – 2020-08-11 (×2): 500 mg via INTRAVENOUS
  Filled 2020-08-10 (×2): qty 500

## 2020-08-10 MED ORDER — SODIUM CHLORIDE 0.9 % IV SOLN: INTRAVENOUS | Status: DC

## 2020-08-10 MED ORDER — CHLORHEXIDINE GLUCONATE CLOTH 2 % EX PADS
6.0000 | MEDICATED_PAD | Freq: Every day | CUTANEOUS | Status: DC
  Administered 2020-08-10 – 2020-08-24 (×13): 6 via TOPICAL

## 2020-08-10 MED ORDER — SODIUM CHLORIDE 0.9 % IV SOLN
200.0000 mg | Freq: Once | INTRAVENOUS | Status: AC
  Administered 2020-08-10: 200 mg via INTRAVENOUS
  Filled 2020-08-10: qty 200

## 2020-08-10 MED ORDER — DEXAMETHASONE SODIUM PHOSPHATE 10 MG/ML IJ SOLN
6.0000 mg | INTRAMUSCULAR | Status: DC
  Administered 2020-08-10 – 2020-08-11 (×2): 6 mg via INTRAVENOUS
  Filled 2020-08-10 (×2): qty 1

## 2020-08-10 MED ORDER — SODIUM CHLORIDE 0.9 % IV SOLN
100.0000 mg | Freq: Every day | INTRAVENOUS | Status: AC
  Administered 2020-08-11 – 2020-08-14 (×4): 100 mg via INTRAVENOUS
  Filled 2020-08-10 (×5): qty 20

## 2020-08-10 MED ORDER — HEPARIN (PORCINE) 25000 UT/250ML-% IV SOLN
1650.0000 [IU]/h | INTRAVENOUS | Status: DC
  Administered 2020-08-10: 1100 [IU]/h via INTRAVENOUS
  Filled 2020-08-10 (×2): qty 250

## 2020-08-10 MED ORDER — ORAL CARE MOUTH RINSE
15.0000 mL | Freq: Two times a day (BID) | OROMUCOSAL | Status: DC
  Administered 2020-08-10 – 2020-08-24 (×29): 15 mL via OROMUCOSAL

## 2020-08-10 MED ORDER — DOCUSATE SODIUM 100 MG PO CAPS
100.0000 mg | ORAL_CAPSULE | Freq: Two times a day (BID) | ORAL | Status: DC | PRN
  Administered 2020-08-13: 100 mg via ORAL
  Filled 2020-08-10: qty 1

## 2020-08-10 MED ORDER — IOHEXOL 350 MG/ML SOLN
100.0000 mL | Freq: Once | INTRAVENOUS | Status: AC | PRN
  Administered 2020-08-10: 100 mL via INTRAVENOUS

## 2020-08-10 MED ORDER — ONDANSETRON HCL 4 MG/2ML IJ SOLN: 4.0000 mg | Freq: Four times a day (QID) | INTRAMUSCULAR | Status: DC | PRN

## 2020-08-10 MED ORDER — ACETAMINOPHEN 325 MG PO TABS
650.0000 mg | ORAL_TABLET | Freq: Four times a day (QID) | ORAL | Status: DC | PRN
  Administered 2020-08-10: 650 mg via ORAL
  Filled 2020-08-10: qty 2

## 2020-08-10 MED ORDER — SODIUM CHLORIDE 0.9 % IV SOLN
1000.0000 mL | INTRAVENOUS | Status: DC
  Administered 2020-08-10: 1000 mL via INTRAVENOUS

## 2020-08-10 MED ORDER — GUAIFENESIN-DM 100-10 MG/5ML PO SYRP
10.0000 mL | ORAL_SOLUTION | ORAL | Status: DC | PRN
  Administered 2020-08-10 – 2020-08-12 (×5): 10 mL via ORAL
  Filled 2020-08-10 (×7): qty 10

## 2020-08-10 MED ORDER — POLYETHYLENE GLYCOL 3350 17 G PO PACK
17.0000 g | PACK | Freq: Every day | ORAL | Status: DC | PRN
  Filled 2020-08-10: qty 1

## 2020-08-10 MED ORDER — ENOXAPARIN SODIUM 40 MG/0.4ML ~~LOC~~ SOLN
40.0000 mg | SUBCUTANEOUS | Status: DC
  Administered 2020-08-10: 40 mg via SUBCUTANEOUS
  Filled 2020-08-10: qty 0.4

## 2020-08-10 NOTE — Progress Notes (Signed)
The patient is requesting for Tylenol for headache and Something for cough. Notified PCCM and the RN responded stated she would notified the provider. Awaiting a response. Will continue to monitor.

## 2020-08-10 NOTE — H&P (Signed)
NAME:  Sharon Kirk, MRN:  [id number], DOB:  June 22, 1961, LOS: 0 ADMISSION DATE:  08/10/2020, CONSULTATION DATE: 04/05/2020 REFERRING MD: Dr. Tomi Bamberger, CHIEF COMPLAINT: COVID-19 pneumonia  Brief History   59 year old presents with hyponatremia and Covid positive pneumonia with profound hypoxemia  History of present illness   Patient is a 59 year old diagnosed with Covid 07/30/2020.  She received: Antibiotics 08/04/2020.  She presented to the emergency room this evening with increased dyspnea.  She has no diarrhea nausea vomiting.  Her presenting O2 saturation was 51% on room air.  Patient is unvaccinated.  Chest x-ray is typical for Covid pneumonia.  Patient was found to have a D-dimer greater than 20.  CTA of the chest currently is pending.  Patient also has a profoundly low sodium of 109.  She is on no diuretics and presumably this is secondary to SIADH related to her pneumonia.  She is fully awake, alert and oriented x 3 and denies any recent confusion or problems with mentation Past Medical History   . Allergy   . Cancer ([place])    Melanoma  . Hyperlipidemia   . IBS (irritable bowel syndrome)   . Osteopenia   . PONV (postoperative nausea and vomiting)   . Thyroid nodule      Significant Hospital Events   Admission 08/10/2020  Consults:  PCCM  Procedures:  NA  Significant Diagnostic Tests:  NA  Micro Data:  NA  Antimicrobials:  NA  Interim history/subjective:  NA  Objective   Blood pressure 128/85, pulse 82, temperature 97.7 F (36.5 C), resp. rate 19, height 5\' 5"  (1.651 m), weight 71.7 kg, last menstrual period 06/30/1990, SpO2 92 %.       No intake or output data in the 24 hours ending 08/10/20 0605 Filed Weights   08/10/20 0249  Weight: 71.7 kg    Examination: General: Regular HENT: Within normal limits Lungs: coarse wet BS bilaterally Cardiovascular: Tachycardic Abdomen: Benign Extremities: Within normal limits Neuro: awake alert non  focal GU: N/A  Resolved Hospital Problem list   NA  Assessment & Plan:  1.  Covid pneumonia with profound hypoxemia: Patient is currently on 15 L high flow oxygen with oxygen saturation in the low 90s.  I will start remdesivir and Decadron.  Will admit to the ICU.  BiPAP as needed.  2.  Found hyponatremia sodium of 109: We will gently hydrate with normal saline repeat sodium later this morning.  Will need to be closely monitored for possible seizure activity.  Not give hypertonic saline currently.  3.  Negative CTA of the chest for pulmonary embolus with markedly elevated D-dimer: lower ext doppler  4.  Significant right-sided pleural effusion: We will have to have her sodium in the form of normal saline.  This may worsen the effusion.  Thoracentesis might be considered.  We will also obtain echocardiogram and BNP.  5.  CT scan in areas appears to be consistent with bacterial pneumonia showing areas of consolidation with air bronchograms.  Will add Zithromax and Rocephin to cover for possible community-acquired pneumonia.  Best practice:  Diet: Regular Pain/Anxiety/Delirium protocol (if indicated): N/A VAP protocol (if indicated): N/A DVT prophylaxis:prophylactic Lovenox dose GI prophylaxis: N/A Glucose control: Monitor Mobility: Bedrest Code Status: Full Family Communication: Discussed with patient Disposition: To ICU for observation further therapy  Labs   CBC: Recent Labs  Lab 08/10/20 0304  WBC 15.0*  NEUTROABS 13.8*  HGB 11.2*  HCT 30.5*  MCV 77.2*  PLT 305  Basic Metabolic Panel: Recent Labs  Lab 08/10/20 0304  NA 109*  K 3.4*  CL 70*  CO2 24  GLUCOSE 141*  BUN 8  CREATININE 0.39*  CALCIUM 7.9*   GFR: Estimated Creatinine Clearance: 73.3 mL/min (A) (by C-G formula based on SCr of 0.39 mg/dL (L)). Recent Labs  Lab 08/10/20 0304  PROCALCITON 0.31  WBC 15.0*  LATICACIDVEN 3.5*    Liver Function Tests: Recent Labs  Lab 08/10/20 0304  AST 29   ALT 37  ALKPHOS 125  BILITOT 1.0  PROT 6.3*  ALBUMIN 2.6*   No results for input(s): LIPASE, AMYLASE in the last 168 hours. No results for input(s): AMMONIA in the last 168 hours.  ABG No results found for: PHART, PCO2ART, PO2ART, HCO3, TCO2, ACIDBASEDEF, O2SAT   Coagulation Profile: No results for input(s): INR, PROTIME in the last 168 hours.  Cardiac Enzymes: No results for input(s): CKTOTAL, CKMB, CKMBINDEX, TROPONINI in the last 168 hours.  HbA1C: No results found for: HGBA1C  CBG: No results for input(s): GLUCAP in the last 168 hours.  Review of Systems:   Negative other than some mild diarrhea.  Past Medical History  She,  has a past medical history of Allergy, Cancer ([place]), Hyperlipidemia, IBS (irritable bowel syndrome), Osteopenia, PONV (postoperative nausea and vomiting), and Thyroid nodule.   Surgical History    Past Surgical History:  Procedure Laterality Date  . ABDOMINAL HYSTERECTOMY  1991  . DILATION AND CURETTAGE OF UTERUS  1988  . MELANOMA EXCISION Right 05/23/2016   Procedure: WIDE  EXCISION RIGHT SHOULDER MELANOMA AND WIDE EXCISION LEFT LOWER LEG MELANOMA;  Surgeon: Jackolyn Confer, MD;  Location: [place];  Service: General;  Laterality: Right;  right shoulder and left lower leg sites  . SENTINEL NODE BIOPSY Left 05/23/2016   Procedure: LEFT INGUINAL SENTINEL LYMPH  NODE BIOPSY;  Surgeon: Jackolyn Confer, MD;  Location: [facility];  Service: General;  Laterality: Left;  . THYROID SURGERY  07/2015   NODULE     Social History   reports that she has never smoked. She has never used smokeless tobacco. She reports that she does not drink alcohol and does not use drugs.   Family History   Her family history includes Cancer in her maternal grandmother and paternal grandfather; Diabetes in her brother and brother; Heart attack in her father; Hypertension in her brother and mother.   Allergies Allergies  Allergen Reactions   . Codeine Nausea Only and Other (See Comments)    Hallucinations; confusion     Home Medications  Prior to Admission medications   Medication Sig Start Date End Date Taking? Authorizing Provider  calcium carbonate (OS-CAL) 600 MG TABS Take 600 mg by mouth 2 (two) times daily with a meal.     Yes [provider]  cholecalciferol (VITAMIN D) 1000 UNITS tablet Take 5,000 Units by mouth daily.    Yes [provider]  Cyanocobalamin (VITAMIN B-12 CR PO) Take 1 tablet by mouth daily.   Yes [provider]  estradiol (ESTRACE) 0.5 MG tablet Take 1 tablet (0.5 mg total) by mouth daily. Patient not taking: Reported on 06/11/2020 09/30/19   Huel Cote, NP     Critical care time: Over 35 minutes was spent bedside evaluation chart review and critical care planning.

## 2020-08-10 NOTE — Progress Notes (Addendum)
NAME:  Sharon Kirk, MRN:  [id number], DOB:  04-25-1961, LOS: 0 ADMISSION DATE:  08/10/2020, CONSULTATION DATE: 04/05/2020 REFERRING MD: Dr. Tomi Bamberger, CHIEF COMPLAINT: COVID-19 pneumonia  Brief History   59 year old presents with hyponatremia and Covid positive pneumonia with profound hypoxemia  -> Diagnosed on 15th, administered  bamlanivimab\etesevimab received antibiotics on 10/20, admitted 10/26 Past Medical History   . Allergy   . Cancer ([place])    Melanoma  . Hyperlipidemia   . IBS (irritable bowel syndrome)   . Osteopenia   . PONV (postoperative nausea and vomiting)   . Thyroid nodule      Significant Hospital Events   Admission 08/10/2020: Significantly hypoxic with pulse oximetry on room air in the 50s placed on high flow oxygen.  D-dimer greater than 20, CT chest obtained negative for PE did have positive lower extremity DVT on the left.  Procalcitonin initially normal.  CT along with diffuse airspace disease had consolidative changes on the right upper, middle and lower lobe.  There is also right greater than left effusions.  Started on empiric antibiotics, culture sent, IV Decadron started, IV remdesivir started, admitted to the intensive care.  Therapeutic heparin started after right sided diagnostic and therapeutic thoracentesis.  Goals of care discussed with patient she desires full code at this point  Consults:  PCCM  Procedures:  NA  Significant Diagnostic Tests:  CT angiogram 10/26:Showing diffuse areas of patchy groundglass opacities bilateral there is consolidated lung disease in the right upper lobe, middle and right lower lobe, there is a moderate sized right pleural effusion very small left pleural effusion no central pulmonary embolus appreciated Lower extremity Dopplers/ultrasound 10/26: There is no DVT in the right, however there was left posterior tibial DVT Echocardiogram 10/26 pending  Micro Data:  10/26 blood culture x2 10/26 urine strep  antigen 10/26 urine Legionella antigen  Antimicrobials:  Remdesivir started 10/26>>>  Interim history/subjective:  Endorses shortness of breath  Objective   Blood pressure (Abnormal) 152/89, pulse 85, temperature 97.7 F (36.5 C), resp. rate (Abnormal) 25, height 5\' 5"  (1.651 m), weight 71.7 kg, last menstrual period 06/30/1990, SpO2 (Abnormal) 88 %.        Intake/Output Summary (Last 24 hours) at 08/10/2020 1104 Last data filed at 08/10/2020 0953 Gross per 24 hour  Intake 600 ml  Output no documentation  Net 600 ml   Filed Weights   08/10/20 0249  Weight: 71.7 kg    Examination:  General: This is a 59 year old white female who is currently on 15 L via high flow oxygen.  She is quite short of breath pulse oximetry 85% with exertional dyspnea even when talking and sitting at rest.  She is only able to complete about 2 word phrases HEENT normocephalic atraumatic no jugular venous distention mucous membranes are moist Pulmonary: Currently on 15 L via high flow nasal cannula device she is diminished bilaterally with accessory use noted with any minimal exertion. Cardiac: Regular rate and rhythm without murmur rub or gallop she is tachycardic Abdomen: Soft nontender no organomegaly Extremities: Warm and dry with brisk capillary refill Neuro: Awake oriented no focal deficits appreciated GU: Due to void. Resolved Hospital Problem list   NA  Assessment & Plan:  Acute hypoxic respiratory failure in the setting of Covid pneumonia with ARDS, and probable community-acquired pneumonia -Requiring fairly high supplemental oxygen Plan Continue supplemental oxygen Encourage self proning Incentive spirometry Pulse oximetry goal greater than 85 Day #1 of 5 remdesivir Day #1 of Decadron Day #1 azithromycin  and Rocephin Sending urine strep and Legionella antigen We will hold off on further immunomodulating therapies given concern for active bacterial process A.m. chest x-ray Cycle  inflammatory markers  Right greater than left pleural effusion -This seems atypical in the setting of Covid Plan Therapeutic and diagnostic right thoracentesis Follow-up echocardiogram  hyponatremia sodium of 109: Asymptomatic, already received saline resuscitation -Suspect this was more dehydration and insensible loss from high fevers Plan Continuing saline infusion Serial chemistries Careful to not correct sodium greater than 8 every 24 hours  Left lower extremity DVT Plan Initiate therapeutic anticoagulation     Best practice:  Diet: Regular Pain/Anxiety/Delirium protocol (if indicated): N/A VAP protocol (if indicated): N/A DVT prophylaxis:therapeutic heparin  GI prophylaxis: N/A Glucose control: Monitor Mobility: Bedrest Code Status: Full Family Communication: Discussed with patient Disposition: To ICU for observation further therapy    Critical care time: 34 minutes    Erick Colace ACNP-BC [place] Pager # [phone] OR # [phone] if no answer

## 2020-08-10 NOTE — ED Notes (Signed)
Echo at bedside. Recollected lactic sent to lab.

## 2020-08-10 NOTE — ED Triage Notes (Signed)
Pt diagnosed with covid on 10/15, c/o SOB and diarrhea, upon arrival to ED pt lethargic, pale and slow to respond

## 2020-08-10 NOTE — Progress Notes (Signed)
Bilateral lower extremity venous duplex has been completed. Preliminary results can be found in CV Proc through chart review.  Results were given to [place].  08/10/20 10:34 AM Carlos Levering RVT

## 2020-08-10 NOTE — ED Notes (Signed)
Date and time results received: 08/10/20 0432 (use smartphrase ".now" to insert current time)  Test: Sodium Critical Value: 109  Name of Provider Notified: Tomi Bamberger  Orders Received? Or Actions Taken?: Orders Received - See Orders for details

## 2020-08-10 NOTE — Progress Notes (Signed)
eLink Physician-Brief Progress Note Patient Name: RICHELLE GLICK DOB: Feb 15, 1961 MRN: [id number]   Date of Service  08/10/2020  HPI/Events of Note  Patient request for medication for cough and headache. AST and ALT are both normal.   eICU Interventions  Plan: 1. Robitussin DM 10 mL PO Q 4 hours PRN headache or pain.  2. Tylenol 650 mg PO Q 6 hours PRN headache or pain.      Intervention Category Major Interventions: Other:  Lysle Dingwall 08/10/2020, 8:52 PM

## 2020-08-10 NOTE — Progress Notes (Signed)
[place] for Heparin Indication: DVT  Allergies  Allergen Reactions  . Codeine Nausea Only and Other (See Comments)    Hallucinations; confusion   Patient Measurements: Height: 5\' 5"  (165.1 cm) Weight: 81 kg (178 lb 9.2 oz) IBW/kg (Calculated) : 57 Heparin Dosing Weight: 74 kg  Vital Signs: Temp: 97.1 F (36.2 C) (10/26 1100) Temp Source: Axillary (10/26 1100) BP: 159/100 (10/26 1200) Pulse Rate: 83 (10/26 1200)  Labs: Recent Labs    08/10/20 0304 08/10/20 0953  HGB 11.2*  --   HCT 30.5*  --   PLT 305  --   CREATININE 0.39* <0.30*   CrCl cannot be calculated (This lab value cannot be used to calculate CrCl because it is not a number: <0.30).  Medical History: Past Medical History:  Diagnosis Date  . Allergy   . Cancer ([place])    Melanoma  . Hyperlipidemia   . IBS (irritable bowel syndrome)   . Osteopenia   . PONV (postoperative nausea and vomiting)   . Thyroid nodule    Medications:  Scheduled:  . Chlorhexidine Gluconate Cloth  6 each Topical Daily  . dexamethasone (DECADRON) injection  6 mg Intravenous Q24H  . mouth rinse  15 mL Mouth Rinse BID  . sodium chloride (PF)       Infusions:  . sodium chloride 100 mL/hr at 08/10/20 1315  . azithromycin Stopped (08/10/20 0953)  . cefTRIAXone (ROCEPHIN)  IV Stopped (08/10/20 0953)  . heparin    . [START ON 08/11/2020] remdesivir 100 mg in NS 100 mL      Assessment: 59 yoF admit 10/26 with Covid PNA, severe hypoxemia, hyponNa. Received mAb 10/20. 10/26 LE dopplers: R neg, L + for DVT, CT neg for PE Begin Heparin infusion after thoracentesis, no bolus.  Previous Lovenox 40mg  daily, discontinued, last dose 10/26 at 8am  Goal of Therapy:  Heparin level 0.3-0.7 units/ml Monitor platelets by anticoagulation protocol: Yes   Plan:  Begin Heparin infusion at 1100 units/hr Check 8 hr Heparin level at 2330 Daily CBC, order daily Hep level at steady state  Keith Cancio, [place] [phone] 08/10/2020,2:15 PM

## 2020-08-10 NOTE — ED Notes (Signed)
Attempted to call report to ICU, they will call back when bed situation is clear.

## 2020-08-10 NOTE — Procedures (Signed)
Thoracentesis  Procedure Note  Sharon Kirk  [id number]  16-May-1961  Date:08/10/20  Time:12:40 PM   Provider Performing:Pete E Kary Kos   Procedure: Thoracentesis with imaging guidance (76195)  Indication(s) Pleural Effusion  Consent Risks of the procedure as well as the alternatives and risks of each were explained to the patient and/or caregiver.  Consent for the procedure was obtained and is signed in the bedside chart  Anesthesia Topical only with 1% lidocaine    Time Out Verified patient identification, verified procedure, site/side was marked, verified correct patient position, special equipment/implants available, medications/allergies/relevant history reviewed, required imaging and test results available.   Sterile Technique Maximal sterile technique including full sterile barrier drape, hand hygiene, sterile gown, sterile gloves, mask, hair covering, sterile ultrasound probe cover (if used).  Procedure Description Ultrasound was used to identify appropriate pleural anatomy for placement and overlying skin marked.  Area of drainage cleaned and draped in sterile fashion. Lidocaine was used to anesthetize the skin and subcutaneous tissue.  650 cc's of cloudy yellow appearing fluid was drained from the right pleural space. Catheter then removed and bandaid applied to site.   Complications/Tolerance None; patient tolerated the procedure well. Chest X-ray is ordered to confirm no post-procedural complication.   EBL Minimal   Specimen(s) Pleural fluid      Erick Colace ACNP-BC [place] Pager # [phone] OR # [phone] if no answer

## 2020-08-10 NOTE — ED Provider Notes (Signed)
[facility] DEPT Provider Note   CSN: [id number] Arrival date & time: 08/10/20  6063   Time seen 3:09 AM  History Chief Complaint  Patient presents with  . Respiratory Distress    Sharon Kirk is a 59 y.o. female.  HPI Patient was diagnosed with Covid on October 15.  She received monoclonal antibodies on October 20.  She states she started feeling worse today with shortness of breath.  She denies fever, chest pain, nausea, or vomiting.  She denies diarrhea but her husband said she was having diarrhea when they presented to the ED.  She states she has some hemorrhoids that are bleeding.  She denies abdominal pain or feeling lightheaded or dizzy.  She states she is taking zinc, vitamin D and vitamin C but not aspirin.  She is also taking Mucinex DM for her cough.  Nursing staff report her initial pulse ox in triage was 51% on room air.     Patient did not take the Covid vaccine  PCP Dr Elza Rafter   Past Medical History:  Diagnosis Date  . Allergy   . Cancer ([place])    Melanoma  . Hyperlipidemia   . IBS (irritable bowel syndrome)   . Osteopenia   . PONV (postoperative nausea and vomiting)   . Thyroid nodule     Patient Active Problem List   Diagnosis Date Noted  . Multiple thyroid nodules 12/12/2017  . Melanoma of skin ([place]) 06/08/2016  . Hypothyroidism 08/14/2012    Past Surgical History:  Procedure Laterality Date  . ABDOMINAL HYSTERECTOMY  1991  . DILATION AND CURETTAGE OF UTERUS  1988  . MELANOMA EXCISION Right 05/23/2016   Procedure: WIDE  EXCISION RIGHT SHOULDER MELANOMA AND WIDE EXCISION LEFT LOWER LEG MELANOMA;  Surgeon: Jackolyn Confer, MD;  Location: [facility];  Service: General;  Laterality: Right;  right shoulder and left lower leg sites  . SENTINEL NODE BIOPSY Left 05/23/2016   Procedure: LEFT INGUINAL SENTINEL LYMPH  NODE BIOPSY;  Surgeon: Jackolyn Confer, MD;  Location: [facility];  Service: General;   Laterality: Left;  . THYROID SURGERY  07/2015   NODULE     OB History    Gravida  0   Para  0   Term  0   Preterm  0   AB  0   Living  0     SAB  0   TAB  0   Ectopic  0   Multiple  0   Live Births  0           Family History  Problem Relation Age of Onset  . Diabetes Brother   . Hypertension Brother   . Cancer Maternal Grandmother        COLON  . Cancer Paternal Grandfather        PROSTATE  . Hypertension Mother   . Diabetes Brother   . Heart attack Father     Social History   Tobacco Use  . Smoking status: Never Smoker  . Smokeless tobacco: Never Used  Vaping Use  . Vaping Use: Never used  Substance Use Topics  . Alcohol use: No  . 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 Authorizing Provider  calcium carbonate (OS-CAL) 600 MG TABS Take 600 mg by mouth 2 (two) times daily with a meal.  Yes [provider]  cholecalciferol (VITAMIN D) 1000 UNITS tablet Take 5,000 Units by mouth daily.    Yes [provider]  Cyanocobalamin (VITAMIN B-12 CR PO) Take 1 tablet by mouth daily.   Yes [provider]  estradiol (ESTRACE) 0.5 MG tablet Take 1 tablet (0.5 mg total) by mouth daily. Patient not taking: Reported on 06/11/2020 09/30/19   Huel Cote, NP    Allergies    Codeine  Review of Systems   Review of Systems  All other systems reviewed and are negative.   Physical Exam Updated Vital Signs BP (!) 168/94   Pulse 93   Temp 97.7 F (36.5 C)   Resp 18   Ht '5\' 5"'  (1.651 m)   Wt 71.7 kg   LMP 06/30/1990   SpO2 90%   BMI 26.29 kg/m   Physical Exam Vitals and nursing note reviewed.  Constitutional:      General: She is in acute distress.     Appearance: Normal appearance. She is normal weight.  HENT:     Head: Normocephalic and atraumatic.     Right Ear: External ear normal.      Left Ear: External ear normal.  Eyes:     Extraocular Movements: Extraocular movements intact.     Conjunctiva/sclera: Conjunctivae normal.     Pupils: Pupils are equal, round, and reactive to light.  Cardiovascular:     Rate and Rhythm: Normal rate and regular rhythm.     Pulses: Normal pulses.     Heart sounds: Murmur heard.   Pulmonary:     Effort: Tachypnea present. No prolonged expiration.     Breath sounds: Rales present.     Comments: Patient is in mild respiratory distress Abdominal:     General: Abdomen is flat. Bowel sounds are normal.     Palpations: Abdomen is soft.     Tenderness: There is no abdominal tenderness.  Musculoskeletal:        General: No swelling. Normal range of motion.     Cervical back: Normal range of motion.  Skin:    General: Skin is warm and dry.     Coloration: Skin is pale.  Neurological:     General: No focal deficit present.     Mental Status: She is alert and oriented to person, place, and time.     Cranial Nerves: No cranial nerve deficit.  Psychiatric:        Mood and Affect: Affect is flat.        Speech: Speech is delayed.        Behavior: Behavior is slowed.     Comments: Speaks very softly     ED Results / Procedures / Treatments   Labs (all labs ordered are listed, but only abnormal results are displayed) Results for orders placed or performed during the hospital encounter of 08/10/20  Lactic acid, plasma  Result Value Ref Range   Lactic Acid, Venous 3.5 (HH) 0.5 - 1.9 mmol/L  CBC WITH DIFFERENTIAL  Result Value Ref Range   WBC 15.0 (H) 4.0 - 10.5 K/uL   RBC 3.95 3.87 - 5.11 MIL/uL   Hemoglobin 11.2 (L) 12.0 - 15.0 g/dL   HCT 30.5 (L) 36 - 46 %   MCV 77.2 (L) 80.0 - 100.0 fL   MCH 28.4 26.0 - 34.0 pg   MCHC 36.7 (H) 30.0 - 36.0 g/dL   RDW 12.7 11.5 - 15.5 %   Platelets 305 150 - 400 K/uL  nRBC 0.1 0.0 - 0.2 %   Neutrophils Relative % 92 %   Neutro Abs 13.8 (H) 1.7 - 7.7 K/uL   Lymphocytes Relative 4 %   Lymphs Abs  0.7 0.7 - 4.0 K/uL   Monocytes Relative 2 %   Monocytes Absolute 0.3 0.1 - 1.0 K/uL   Eosinophils Relative 0 %   Eosinophils Absolute 0.0 0.0 - 0.5 K/uL   Basophils Relative 0 %   Basophils Absolute 0.0 0.0 - 0.1 K/uL   Immature Granulocytes 2 %   Abs Immature Granulocytes 0.29 (H) 0.00 - 0.07 K/uL  Comprehensive metabolic panel  Result Value Ref Range   Sodium 109 (LL) 135 - 145 mmol/L   Potassium 3.4 (L) 3.5 - 5.1 mmol/L   Chloride 70 (L) 98 - 111 mmol/L   CO2 24 22 - 32 mmol/L   Glucose, Bld 141 (H) 70 - 99 mg/dL   BUN 8 8 - 23 mg/dL   Creatinine, Ser 0.39 (L) 0.44 - 1.00 mg/dL   Calcium 7.9 (L) 8.9 - 10.3 mg/dL   Total Protein 6.3 (L) 6.5 - 8.1 g/dL   Albumin 2.6 (L) 3.5 - 5.0 g/dL   AST 29 15 - 41 U/L   ALT 37 0 - 44 U/L   Alkaline Phosphatase 125 38 - 126 U/L   Total Bilirubin 1.0 0.3 - 1.2 mg/dL   GFR, Estimated >60 >60 mL/min   Anion gap 15 5 - 15  D-dimer, quantitative  Result Value Ref Range   D-Dimer, Quant >20.00 (H) 0.00 - 0.50 ug/mL-FEU  Procalcitonin  Result Value Ref Range   Procalcitonin 0.31 ng/mL  Lactate dehydrogenase  Result Value Ref Range   LDH 456 (H) 98 - 192 U/L  Ferritin  Result Value Ref Range   Ferritin 310 (H) 11 - 307 ng/mL  Triglycerides  Result Value Ref Range   Triglycerides 86 <150 mg/dL  Fibrinogen  Result Value Ref Range   Fibrinogen 462 210 - 475 mg/dL  C-reactive protein  Result Value Ref Range   CRP 13.3 (H) <1.0 mg/dL   Laboratory interpretation all normal except lactic acidosis, leukocytosis, mild anemia, significant hyponatremia, mild hypokalemia, low chloride, hyperglycemia, malnutrition, very elevated D-dimer and elevation of several other inflammatory markers.    EKG EKG Interpretation  Date/Time:  Tuesday August 10 2020 02:49:59 EDT Ventricular Rate:  84 PR Interval:    QRS Duration: 119 QT Interval:  552 QTC Calculation: 657 R Axis:   -56 Text Interpretation: Sinus rhythm Left atrial enlargement Incomplete  RBBB and LAFB Probable anterior infarct, age indeterminate Lateral leads are also involved Baseline wander No old tracing to compare Confirmed by Rolland Porter [phone]) on 08/10/2020 3:32:12 AM   Radiology  CT Angio Chest PE W/Cm &/Or Wo Cm  Result Date: 08/10/2020 CLINICAL DATA:  COVID positive. Shortness of breath. Elevated D-dimer. Pulmonary embolus suspected. EXAM: CT ANGIOGRAPHY CHEST WITH CONTRAST TECHNIQUE: Multidetector CT imaging of the chest was performed using the standard protocol during bolus administration of intravenous contrast. Multiplanar CT image reconstructions and MIPs were obtained to evaluate the vascular anatomy. CONTRAST:  163m OMNIPAQUE IOHEXOL 350 MG/ML SOLN COMPARISON:  None. FINDINGS: Cardiovascular: Heart size upper normal. No substantial pericardial effusion. No thoracic aortic aneurysm. No large central pulmonary embolus. No evidence for filling defect in segmental or subsegmental pulmonary arteries although given the substantial motion degradation on the current exam, assessment is unreliable, especially in the lower lobes. Mediastinum/Nodes: 2.5 cm left thyroid nodule evident. Scattered upper normal mediastinal  lymph nodes evident. The esophagus has normal imaging features. There is no axillary lymphadenopathy. Lungs/Pleura: Diffuse areas of patchy ground-glass opacity are noted in the lungs bilaterally with superimposed areas of confluent consolidative lung disease, most prominently in the right upper lobe, lingula, and right lower lobe. Moderate right and small left pleural effusions associated. Upper Abdomen: Unremarkable. Musculoskeletal: No worrisome lytic or sclerotic osseous abnormality. Diffuse body wall edema evident. Review of the MIP images confirms the above findings. IMPRESSION: 1. No CT evidence for large central pulmonary embolus. Assessment of segmental or subsegmental pulmonary arteries to the lower lobes is unreliable due to substantial motion degradation. 2.  Diffuse areas of patchy ground-glass opacity in the lungs bilaterally with superimposed areas of confluent consolidative lung disease, most prominently in the right upper lobe, lingula, and right lower lobe. Imaging features compatible with multifocal pneumonia. 3. Moderate right and small left pleural effusions. 4. 2.5 cm left thyroid nodule. This has been evaluated on previous imaging. (ref: J Am Coll Radiol. 2015 Feb;12(2): 143-50). 5. Diffuse body wall edema. Electronically Signed   By: Misty Stanley M.D.   On: 08/10/2020 06:48   DG Chest Port 1 View  Result Date: 08/10/2020 CLINICAL DATA:  Shortness of breath, covid positive EXAM: PORTABLE CHEST 1 VIEW COMPARISON:  None. FINDINGS: The heart size and mediastinal contours are partially obscured. Hazy patchy airspace opacities are seen throughout both lungs, right worse than left. Trace bilateral pleural effusions are present. No acute osseous abnormality. IMPRESSION: Extensive bilateral airspace opacities, right greater than left, consistent with atypical viral pneumonia Trace bilateral pleural effusions Electronically Signed   By: Prudencio Pair M.D.   On: 08/10/2020 03:32    DG Chest Port 1 View  Result Date: 08/10/2020 CLINICAL DATA:  Shortness of breath, covid positive EXAM: PORTABLE CHEST 1 VIEW COMPARISON:  None. FINDINGS: The heart size and mediastinal contours are partially obscured. Hazy patchy airspace opacities are seen throughout both lungs, right worse than left. Trace bilateral pleural effusions are present. No acute osseous abnormality. IMPRESSION: Extensive bilateral airspace opacities, right greater than left, consistent with atypical viral pneumonia Trace bilateral pleural effusions Electronically Signed   By: Prudencio Pair M.D.   On: 08/10/2020 03:32    Procedures .Critical Care Performed by: Rolland Porter, MD Authorized by: Rolland Porter, MD   Critical care provider statement:    Critical care time (minutes):  39   Critical care  was necessary to treat or prevent imminent or life-threatening deterioration of the following conditions:  Respiratory failure   Critical care was time spent personally by me on the following activities:  Discussions with consultants, examination of patient, obtaining history from patient or surrogate, ordering and review of laboratory studies, ordering and review of radiographic studies, pulse oximetry and re-evaluation of patient's condition   (including critical care time)  Medications Ordered in ED Medications  0.9 %  sodium chloride infusion (1,000 mLs Intravenous New Bag/Given (Non-Interop) 08/10/20 0330)  sodium chloride (PF) 0.9 % injection (has no administration in time range)  0.9 %  sodium chloride infusion (0 mLs Intravenous Hold 08/10/20 0649)  iohexol (OMNIPAQUE) 350 MG/ML injection 100 mL (100 mLs Intravenous Contrast Given 08/10/20 0553)    ED Course  I have reviewed the triage vital signs and the nursing notes.  Pertinent labs & imaging results that were available during my care of the patient were reviewed by me and considered in my medical decision making (see chart for details).    MDM Rules/Calculators/A&P  Patient was placed on 15 L/min oxygen high flow.  While I was in the room the nurse dropped her down to 12 L and her pulse ox was 93%.  Patient states she feels better on oxygen.  Laboratory testing and chest x-ray was ordered.  Patient's D-dimer was very elevated.  CTA of the chest was ordered.  Patient has a very low sodium most likely from her diarrhea that her husband describing her having.  I will need to talk to critical care to see if they feel like she needs 3% normal saline.  Patient was discussed with Dr. Oletta Darter, critical care.  In particular we discussed her hyponatremia.  He states since she is not confused and she has not had a seizure he did not think she needed 3% normal saline.  He feels we should start her on normal saline  at 50 cc/h and repeat be met in 4 hours.  He states he will put her on the list for the critical care team but he is not sure when they will be able to see her.  06:45 AM Critical Care is here to see patient.   Final Clinical Impression(s) / ED Diagnoses Final diagnoses:  Pneumonia due to COVID-19 virus  Hypoxia  Hyponatremia    Rx / DC Orders  Plan admission  Rolland Porter, MD, Barbette Or, MD 08/10/20 [phone]

## 2020-08-10 NOTE — ED Notes (Signed)
Report given to Jason, RN.

## 2020-08-10 NOTE — Progress Notes (Signed)
CRITICAL VALUE ALERT  Critical Value:  Sodium = 110  Date & Time Notied:  08/10/2020 1141  Provider Notified: Marni Griffon NP  Orders Received/Actions taken: NP aware of trend. IVF

## 2020-08-10 NOTE — Progress Notes (Signed)
  Echocardiogram 2D Echocardiogram has been performed.  Sharon Kirk 08/10/2020, 8:48 AM

## 2020-08-10 NOTE — ED Notes (Signed)
Pt initially placed on 15L NRB with improvement of O2 sats to 83%, pt switched to 15L HFNC with improvement of O2 sats to 93-96%

## 2020-08-11 DIAGNOSIS — J9601 Acute respiratory failure with hypoxia: Secondary | ICD-10-CM | POA: Diagnosis not present

## 2020-08-11 DIAGNOSIS — O2230 Deep phlebothrombosis in pregnancy, unspecified trimester: Secondary | ICD-10-CM | POA: Diagnosis not present

## 2020-08-11 DIAGNOSIS — J80 Acute respiratory distress syndrome: Secondary | ICD-10-CM

## 2020-08-11 DIAGNOSIS — U071 COVID-19: Secondary | ICD-10-CM | POA: Diagnosis not present

## 2020-08-11 DIAGNOSIS — E871 Hypo-osmolality and hyponatremia: Secondary | ICD-10-CM

## 2020-08-11 LAB — BASIC METABOLIC PANEL
Anion gap: 12 (ref 5–15)
Anion gap: 13 (ref 5–15)
Anion gap: 9 (ref 5–15)
Anion gap: 10 (ref 5–15)
BUN: 10 mg/dL (ref 8–23)
BUN: 9 mg/dL (ref 8–23)
BUN: 11 mg/dL (ref 8–23)
BUN: 12 mg/dL (ref 8–23)
CO2: 28 mmol/L (ref 22–32)
CO2: 23 mmol/L (ref 22–32)
CO2: 27 mmol/L (ref 22–32)
CO2: 24 mmol/L (ref 22–32)
Calcium: 7.8 mg/dL — ABNORMAL LOW (ref 8.9–10.3)
Calcium: 7.5 mg/dL — ABNORMAL LOW (ref 8.9–10.3)
Calcium: 7.7 mg/dL — ABNORMAL LOW (ref 8.9–10.3)
Calcium: 7.8 mg/dL — ABNORMAL LOW (ref 8.9–10.3)
Chloride: 93 mmol/L — ABNORMAL LOW (ref 98–111)
Chloride: 95 mmol/L — ABNORMAL LOW (ref 98–111)
Chloride: 97 mmol/L — ABNORMAL LOW (ref 98–111)
Chloride: 97 mmol/L — ABNORMAL LOW (ref 98–111)
Creatinine, Ser: 0.38 mg/dL — ABNORMAL LOW (ref 0.44–1.00)
Creatinine, Ser: 0.49 mg/dL (ref 0.44–1.00)
Creatinine, Ser: 0.5 mg/dL (ref 0.44–1.00)
Creatinine, Ser: 0.41 mg/dL — ABNORMAL LOW (ref 0.44–1.00)
GFR, Estimated: >60 mL/min (ref >60)
GFR, Estimated: >60 mL/min (ref >60)
GFR, Estimated: >60 mL/min (ref >60)
GFR, Estimated: >60 mL/min (ref >60)
Glucose, Bld: 95 mg/dL (ref 70–99)
Glucose, Bld: 158 mg/dL — ABNORMAL HIGH (ref 70–99)
Glucose, Bld: 122 mg/dL — ABNORMAL HIGH (ref 70–99)
Glucose, Bld: 121 mg/dL — ABNORMAL HIGH (ref 70–99)
Potassium: 3 mmol/L — ABNORMAL LOW (ref 3.5–5.1)
Potassium: 3.4 mmol/L — ABNORMAL LOW (ref 3.5–5.1)
Potassium: 3.9 mmol/L (ref 3.5–5.1)
Potassium: 3.5 mmol/L (ref 3.5–5.1)
Sodium: 133 mmol/L — ABNORMAL LOW (ref 135–145)
Sodium: 131 mmol/L — ABNORMAL LOW (ref 135–145)
Sodium: 133 mmol/L — ABNORMAL LOW (ref 135–145)
Sodium: 131 mmol/L — ABNORMAL LOW (ref 135–145)

## 2020-08-11 LAB — CBC
HCT: 32.3 % — ABNORMAL LOW (ref 36.0–46.0)
Hemoglobin: 11.2 g/dL — ABNORMAL LOW (ref 12.0–15.0)
MCH: 28.1 pg (ref 26.0–34.0)
MCHC: 34.7 g/dL (ref 30.0–36.0)
MCV: 81 fL (ref 80.0–100.0)
Platelets: 349 10*3/uL (ref 150–400)
RBC: 3.99 MIL/uL (ref 3.87–5.11)
RDW: 13.2 % (ref 11.5–15.5)
WBC: 12.3 10*3/uL — ABNORMAL HIGH (ref 4.0–10.5)
nRBC: 0.2 % (ref 0.0–0.2)

## 2020-08-11 LAB — FIBRINOGEN: Fibrinogen: 357 mg/dL (ref 210–475)

## 2020-08-11 LAB — PROCALCITONIN: Procalcitonin: 0.4 ng/mL

## 2020-08-11 LAB — C-REACTIVE PROTEIN: CRP: 9.7 mg/dL — ABNORMAL HIGH (ref <1.0)

## 2020-08-11 LAB — HEPARIN LEVEL (UNFRACTIONATED): Heparin Unfractionated: 0.13 IU/mL — ABNORMAL LOW (ref 0.30–0.70)

## 2020-08-11 LAB — CYTOLOGY - NON PAP

## 2020-08-11 LAB — D-DIMER, QUANTITATIVE: D-Dimer, Quant: 5.58 ug/mL-FEU — ABNORMAL HIGH (ref 0.00–0.50)

## 2020-08-11 MED ORDER — POTASSIUM CHLORIDE 10 MEQ/100ML IV SOLN
10.0000 meq | INTRAVENOUS | Status: AC
  Administered 2020-08-11 (×4): 10 meq via INTRAVENOUS
  Filled 2020-08-11 (×4): qty 100

## 2020-08-11 MED ORDER — ENOXAPARIN SODIUM 80 MG/0.8ML ~~LOC~~ SOLN
80.0000 mg | Freq: Once | SUBCUTANEOUS | Status: AC
  Administered 2020-08-11: 80 mg via SUBCUTANEOUS
  Filled 2020-08-11: qty 0.8

## 2020-08-11 MED ORDER — ENOXAPARIN SODIUM 80 MG/0.8ML ~~LOC~~ SOLN
80.0000 mg | Freq: Two times a day (BID) | SUBCUTANEOUS | Status: DC
  Administered 2020-08-11 – 2020-08-13 (×5): 80 mg via SUBCUTANEOUS
  Filled 2020-08-11 (×6): qty 0.8

## 2020-08-11 MED ORDER — POTASSIUM CHLORIDE CRYS ER 20 MEQ PO TBCR
40.0000 meq | EXTENDED_RELEASE_TABLET | Freq: Once | ORAL | Status: AC
  Administered 2020-08-11: 40 meq via ORAL
  Filled 2020-08-11: qty 2

## 2020-08-11 NOTE — TOC Initial Note (Addendum)
Transition of Care [facility]) - Initial/Assessment Note    Patient Details  Name: Sharon Kirk MRN: [id number] Date of Birth: Mar 10, 1961  [facility] CM/SW Contact:    Leeroy Cha, RN Phone Number: 08/11/2020, 9:00 AM  Clinical Narrative:                  History of present illness   Patient is a 59 year old diagnosed with Covid 07/30/2020.  She received: Antibiotics 08/04/2020.  She presented to the emergency room this evening with increased dyspnea.  She has no diarrhea nausea vomiting.  Her presenting O2 saturation was 51% on room air.  Patient is unvaccinated.  Chest x-ray is typical for Covid pneumonia.  Patient was found to have a D-dimer greater than 20.  CTA of the chest currently is pending.  Patient also has a profoundly low sodium of 109.  She is on no diuretics and presumably this is secondary to SIADH related to her pneumonia.  She is fully awake, alert and oriented x 3 and denies any recent confusion or problems with mentation o2 aqt 15l/min hfnrb mask, iv zithromax,iv ns at 100cc/hr, k runs x4,iv remdesivir through [id number]. Following for toc needs plan is to return home Following for progression.  Expected Discharge Plan: Home/Self Care Barriers to Discharge: Barriers Unresolved (comment) (covid)   Patient Goals and CMS Choice Patient states their goals for this hospitalization and ongoing recovery are:: to go home CMS Medicare.gov Compare Post Acute Care list provided to:: Patient    Expected Discharge Plan and Services Expected Discharge Plan: Home/Self Care   Discharge Planning Services: CM Consult   Living arrangements for the past 2 months: Single Family Home                                      Prior Living Arrangements/Services Living arrangements for the past 2 months: Single Family Home Lives with:: Spouse Patient language and need for interpreter reviewed:: Yes Do you feel safe going back to the place where you live?:  Yes      Need for Family Participation in Patient Care: Yes (Comment) Care giver support system in place?: Yes (comment)   Criminal Activity/Legal Involvement Pertinent to Current Situation/Hospitalization: No - Comment as needed  Activities of Daily Living Home Assistive Devices/Equipment: Eyeglasses ADL Screening (condition at time of admission) Patient's cognitive ability adequate to safely complete daily activities?: Yes Is the patient deaf or have difficulty hearing?: No Does the patient have difficulty seeing, even when wearing glasses/contacts?: No Does the patient have difficulty concentrating, remembering, or making decisions?: No Patient able to express need for assistance with ADLs?: Yes Does the patient have difficulty dressing or bathing?: No Independently performs ADLs?: Yes (appropriate for developmental age) Does the patient have difficulty walking or climbing stairs?: Yes (secondary to shortness of breath and weakness) Weakness of Legs: Both Weakness of Arms/Hands: None  Permission Sought/Granted                  Emotional Assessment Appearance:: Appears stated age Attitude/Demeanor/Rapport: Engaged Affect (typically observed): Calm Orientation: : Oriented to Self, Oriented to Place, Oriented to  Time, Oriented to Situation Alcohol / Substance Use: Not Applicable Psych Involvement: No (comment)  Admission diagnosis:  Hyponatremia [E87.1] Hypoxia [R09.02] Pneumonia due to COVID-19 virus [U07.1, J12.82] Patient Active Problem List   Diagnosis Date Noted  . Pneumonia due to COVID-19 virus 08/10/2020  .  S/P thoracentesis   . Pleural effusion, right   . Hyponatremia   . Acute respiratory distress syndrome (ARDS) due to COVID-19 virus ([place])   . Community acquired pneumonia   . Multiple thyroid nodules 12/12/2017  . Melanoma of skin ([place]) 06/08/2016  . Hypothyroidism 08/14/2012   PCP:  Patient, No Pcp Per Pharmacy:   [id number] - [place], [place] -  [address] [place] [address] Phone: [phone] Fax: [phone]     Social Determinants of Health (SDOH) Interventions    Readmission Risk Interventions No flowsheet data found.

## 2020-08-11 NOTE — Progress Notes (Signed)
[place] for Heparin Indication: DVT  Allergies  Allergen Reactions  . Codeine Nausea Only and Other (See Comments)    Hallucinations; confusion   Patient Measurements: Height: 5\' 5"  (165.1 cm) Weight: 77.7 kg (171 lb 4.8 oz) IBW/kg (Calculated) : 57 Heparin Dosing Weight: 74 kg  Vital Signs: Temp: 97.5 F (36.4 C) (10/27 0801) Temp Source: Oral (10/27 0801) BP: 129/75 (10/27 0801) Pulse Rate: 86 (10/27 0801)  Labs: Recent Labs    08/10/20 0304 08/10/20 0953 08/10/20 1754 08/10/20 2318 08/11/20 0730  HGB 11.2*  --   --   --  11.2*  HCT 30.5*  --   --   --  32.3*  PLT 305  --   --   --  349  HEPARINUNFRC  --   --   --  0.15* 0.13*  CREATININE 0.39*   < > 0.43* 0.34* 0.38*   < > = values in this interval not displayed.   Estimated Creatinine Clearance: 76.1 mL/min (A) (by C-G formula based on SCr of 0.38 mg/dL (L)).  Medical History: Past Medical History:  Diagnosis Date  . Allergy   . Cancer ([place])    Melanoma  . Hyperlipidemia   . IBS (irritable bowel syndrome)   . Osteopenia   . PONV (postoperative nausea and vomiting)   . Thyroid nodule    Medications:  Scheduled:  . Chlorhexidine Gluconate Cloth  6 each Topical Daily  . dexamethasone (DECADRON) injection  6 mg Intravenous Q24H  . mouth rinse  15 mL Mouth Rinse BID   Infusions:  . sodium chloride 100 mL/hr at 08/11/20 1308  . azithromycin 500 mg (08/11/20 0852)  . cefTRIAXone (ROCEPHIN)  IV Stopped (08/11/20 0843)  . heparin 1,400 Units/hr (08/11/20 0026)  . remdesivir 100 mg in NS 100 mL      Assessment: 58 yoF admit 10/26 with Covid PNA, severe hypoxemia, hypoNa. Received mAb 10/20. LE dopplers: R neg, L + for DVT, CT neg for PE. Pharmacy to dose IV heparin after thoracentesis, no bolus.   Baseline INR, aPTT: not done  Prior anticoagulation: ppx Lovenox 40 mg; LD 10/26 at 0800  Significant events:  Today, 08/11/2020:  CBC: Hgb low but stable after  thoracentesis; Plt stable WNL  Most recent heparin level remains SUBtherapeutic on 1400 units/hr despite ~4 unit/kg/hr rate increase  No bleeding or infusion issues per nursing; no hemoptysis or bleeding from thora site  Goal of Therapy: Heparin level 0.3-0.7 units/ml Monitor platelets by anticoagulation protocol: Yes  Plan:  Increase heparin IV infusion to 1650 units/hr - increasing less than per nomogram as I still have some doubts about the heparin level, despite confirming good technique  Check heparin level 6 hrs after rate change  Daily CBC, daily heparin level once stable  Monitor for signs of bleeding or thrombosis  Reuel Boom, PharmD, BCPS [phone] 08/11/2020, 9:49 AM

## 2020-08-11 NOTE — Progress Notes (Signed)
K+2.9 Replaced per protocol  Another BMP is ordered for 0600. Replacement will not be done by then. Asked Bedside RN to make sure lab stick arm opposite of replacement infusion

## 2020-08-11 NOTE — Progress Notes (Signed)
[place] for Heparin Indication: DVT  Allergies  Allergen Reactions  . Codeine Nausea Only and Other (See Comments)    Hallucinations; confusion   Patient Measurements: Height: 5\' 5"  (165.1 cm) Weight: 81 kg (178 lb 9.2 oz) IBW/kg (Calculated) : 57 Heparin Dosing Weight: 74 kg  Vital Signs: Temp: 97.9 F (36.6 C) (10/26 2000) Temp Source: Oral (10/26 2000) BP: 137/84 (10/26 2300) Pulse Rate: 88 (10/26 2300)  Labs: Recent Labs    08/10/20 0304 08/10/20 0304 08/10/20 0953 08/10/20 1754 08/10/20 2318  HGB 11.2*  --   --   --   --   HCT 30.5*  --   --   --   --   PLT 305  --   --   --   --   HEPARINUNFRC  --   --   --   --  0.15*  CREATININE 0.39*   < > <0.30* 0.43* 0.34*   < > = values in this interval not displayed.   Estimated Creatinine Clearance: 77.6 mL/min (A) (by C-G formula based on SCr of 0.34 mg/dL (L)).  Medical History: Past Medical History:  Diagnosis Date  . Allergy   . Cancer ([place])    Melanoma  . Hyperlipidemia   . IBS (irritable bowel syndrome)   . Osteopenia   . PONV (postoperative nausea and vomiting)   . Thyroid nodule    Medications:  Scheduled:  . Chlorhexidine Gluconate Cloth  6 each Topical Daily  . dexamethasone (DECADRON) injection  6 mg Intravenous Q24H  . mouth rinse  15 mL Mouth Rinse BID   Infusions:  . sodium chloride 100 mL/hr at 08/10/20 2129  . azithromycin Stopped (08/10/20 0953)  . cefTRIAXone (ROCEPHIN)  IV Stopped (08/10/20 0953)  . heparin 1,100 Units/hr (08/10/20 1800)  . remdesivir 100 mg in NS 100 mL      Assessment: 15 yoF admit 10/26 with Covid PNA, severe hypoxemia, hyponNa. Received mAb 10/20. 10/26 LE dopplers: R neg, L + for DVT, CT neg for PE Begin Heparin infusion after thoracentesis, no bolus.  Previous Lovenox 40mg  daily, discontinued, last dose 10/26 at 8am  08/11/2020: -Initial heparin level 0.15 on 1100 units/hr -No bleeding or infusion issues reported by  RN  Goal of Therapy:  Heparin level 0.3-0.7 units/ml Monitor platelets by anticoagulation protocol: Yes   Plan:  Increase Heparin infusion at 1400 units/hr Check 6hr Heparin level after rate change Daily CBC& Hep level while on heparin  [place] [phone] 08/11/2020,12:00 AM

## 2020-08-11 NOTE — Progress Notes (Signed)
The patient is wearing HFNC and NRB mask both at 15 L and she's sating in the 80s.%.  RRT notified to come and assess and give the heated HF. Still tacypneic  Will continue to monitor.

## 2020-08-11 NOTE — Progress Notes (Signed)
NAME:  Sharon Kirk, MRN:  [id number], DOB:  1960-12-27, LOS: 1 ADMISSION DATE:  08/10/2020, CONSULTATION DATE: 04/05/2020 REFERRING MD: Dr. Tomi Bamberger, CHIEF COMPLAINT: COVID-19 pneumonia  Brief History   59 year old presents with hyponatremia and Covid positive pneumonia with profound hypoxemia  -> Diagnosed on 15th, administered  bamlanivimab\etesevimab received antibiotics on 10/20, admitted 10/26 Past Medical History   . Allergy   . Cancer ([place])    Melanoma  . Hyperlipidemia   . IBS (irritable bowel syndrome)   . Osteopenia   . PONV (postoperative nausea and vomiting)   . Thyroid nodule      Significant Hospital Events   Admission 08/10/2020: Significantly hypoxic with pulse oximetry on room air in the 50s placed on high flow oxygen.  D-dimer greater than 20, CT chest obtained negative for PE did have positive lower extremity DVT on the left.  Procalcitonin initially normal.  CT along with diffuse airspace disease had consolidative changes on the right upper, middle and lower lobe.  There is also right greater than left effusions.  Started on empiric antibiotics, culture sent, IV Decadron started, IV remdesivir started, admitted to the intensive care.  Therapeutic heparin started after right sided diagnostic and therapeutic thoracentesis.  Goals of care discussed with patient she desires full code at this point. Right thora completed. 650 ml. boarderline exudate. Felt better after    Consults:  PCCM  Procedures:  Right thoracentesis: volume 650 ml. (F)LDH: 215, (S) LDH: 476, (F) protein <3, (S) protein 6; cell ct: clear, no orgs, neutrophil ct 66,   Significant Diagnostic Tests:  CT angiogram 10/26:Showing diffuse areas of patchy groundglass opacities bilateral there is consolidated lung disease in the right upper lobe, middle and right lower lobe, there is a moderate sized right pleural effusion very small left pleural effusion no central pulmonary embolus  appreciated Lower extremity Dopplers/ultrasound 10/26: There is no DVT in the right, however there was left posterior tibial DVT Echocardiogram 10/26: 1. Left ventricular ejection fraction, by estimation, is 60 to 65%, NML LV fxn, The mid-free wall of the right ventricle is hypokinetic, while apical  contractility and overall excursion of the base are normal. There is also  enhanced respiratory septal displacement. This may represent a forme frust of McConnell's sign (acute cor pulmonale). Right ventricular systolic function is normal.The inferior vena cava is dilated in size with <50% respiratory variability, suggesting right atrial pressure of 15 mmHg.   Micro Data:  10/26 blood culture x2 10/26 urine strep antigen->neg 10/26 urine Legionella antigen  Antimicrobials:  Remdesivir started 10/26>>> azith 10/26>> Rocephin 10/26>>>  Interim history/subjective:  Feels ok. No distress still on 15 liters  Objective   Blood pressure 129/75, pulse 86, temperature (Abnormal) 97.5 F (36.4 C), temperature source Oral, resp. rate 20, height 5\' 5"  (1.651 m), weight 77.7 kg, last menstrual period 06/30/1990, SpO2 95 %.        Intake/Output Summary (Last 24 hours) at 08/11/2020 0825 Last data filed at 08/11/2020 0700 Gross per 24 hour  Intake 3175.86 ml  Output 4076 ml  Net -900.14 ml   Filed Weights   08/10/20 0249 08/10/20 1100 08/11/20 0500  Weight: 71.7 kg 81 kg 77.7 kg    Examination:  General this is a 59 year old female she is sitting up right in bed.  HENT NCAT no JVD pulm occ rhonchi. WOB maybe a little better. Still on 15 liters Card RRR abd soft  Ext warm and dry  Neuro intact  [facility]  Problem list   NA  Assessment & Plan:  Acute hypoxic respiratory failure in the setting of Covid pneumonia with ARDS, and probable community-acquired pneumonia; further c/b evidence of right heart failure by echo 10/26 -initial CT chest neg for PE-->wonder if this clot  traveled from time of imaging to time of echo?? -Requiring fairly high supplemental oxygen -O2 requirements: still 15 liters  Plan Cont supplemental oxygen Sat goal > 85% Encourage self proning, IS and OOB May need BIPAP (will cont to monitor) Day 2 of 5 Remdesivir  Day 2 decadron Day 2/5 azith Day 2/x rocephin AM CXR Trend cbc F/u u-legionella Cont to cycle Inflammatory markers Keep in ICU  Right greater than left pleural effusion->barely/boarderline exudative by strict light's criteria but more likely this is transudate and from Cor Pulmonale  -This seems atypical in the setting of Covid  Plan Await cultures  Fluid and electrolyte imbalance: hyponatremia sodium of 109 on admit. : Asymptomatic, appeared as though this was likely hypovolemic hyponatremia given appropriate response to gentle saline resuscitation  Plan Cont saline infusion Cont serial chems  Left lower extremity DVT Plan IV heparin for now; could transition to LMWH if needed but given uncertainty of clinical course would like to hold off for now      Best practice:  Diet: Regular Pain/Anxiety/Delirium protocol (if indicated): N/A VAP protocol (if indicated): N/A DVT prophylaxis:therapeutic heparin  GI prophylaxis: N/A Glucose control: Monitor Mobility: Bedrest Code Status: Full Family Communication: Discussed with patient Disposition: SDU and [facility].     Critical care time:    Erick Colace ACNP-BC [place] Pager # [phone] OR # [phone] if no answer

## 2020-08-12 ENCOUNTER — Inpatient Hospital Stay (HOSPITAL_COMMUNITY): Payer: BC Managed Care – PPO

## 2020-08-12 DIAGNOSIS — E871 Hypo-osmolality and hyponatremia: Secondary | ICD-10-CM | POA: Diagnosis not present

## 2020-08-12 DIAGNOSIS — Z9889 Other specified postprocedural states: Secondary | ICD-10-CM | POA: Diagnosis not present

## 2020-08-12 DIAGNOSIS — U071 COVID-19: Secondary | ICD-10-CM | POA: Diagnosis not present

## 2020-08-12 DIAGNOSIS — J90 Pleural effusion, not elsewhere classified: Secondary | ICD-10-CM | POA: Diagnosis not present

## 2020-08-12 DIAGNOSIS — J9601 Acute respiratory failure with hypoxia: Secondary | ICD-10-CM | POA: Diagnosis not present

## 2020-08-12 LAB — BASIC METABOLIC PANEL
Anion gap: 9 (ref 5–15)
Anion gap: 11 (ref 5–15)
BUN: 11 mg/dL (ref 8–23)
BUN: 9 mg/dL (ref 8–23)
CO2: 23 mmol/L (ref 22–32)
CO2: 26 mmol/L (ref 22–32)
Calcium: 7.6 mg/dL — ABNORMAL LOW (ref 8.9–10.3)
Calcium: 7.8 mg/dL — ABNORMAL LOW (ref 8.9–10.3)
Chloride: 97 mmol/L — ABNORMAL LOW (ref 98–111)
Chloride: 93 mmol/L — ABNORMAL LOW (ref 98–111)
Creatinine, Ser: 0.37 mg/dL — ABNORMAL LOW (ref 0.44–1.00)
Creatinine, Ser: 0.43 mg/dL — ABNORMAL LOW (ref 0.44–1.00)
GFR, Estimated: >60 mL/min (ref >60)
GFR, Estimated: >60 mL/min (ref >60)
Glucose, Bld: 106 mg/dL — ABNORMAL HIGH (ref 70–99)
Glucose, Bld: 143 mg/dL — ABNORMAL HIGH (ref 70–99)
Potassium: 3.3 mmol/L — ABNORMAL LOW (ref 3.5–5.1)
Potassium: 3.5 mmol/L (ref 3.5–5.1)
Sodium: 129 mmol/L — ABNORMAL LOW (ref 135–145)
Sodium: 130 mmol/L — ABNORMAL LOW (ref 135–145)

## 2020-08-12 LAB — CBC
HCT: 31.4 % — ABNORMAL LOW (ref 36.0–46.0)
Hemoglobin: 10.7 g/dL — ABNORMAL LOW (ref 12.0–15.0)
MCH: 28.2 pg (ref 26.0–34.0)
MCHC: 34.1 g/dL (ref 30.0–36.0)
MCV: 82.8 fL (ref 80.0–100.0)
Platelets: 342 10*3/uL (ref 150–400)
RBC: 3.79 MIL/uL — ABNORMAL LOW (ref 3.87–5.11)
RDW: 14.4 % (ref 11.5–15.5)
WBC: 15.8 10*3/uL — ABNORMAL HIGH (ref 4.0–10.5)
nRBC: 0.1 % (ref 0.0–0.2)

## 2020-08-12 LAB — OSMOLALITY, URINE: Osmolality, Ur: 373 mOsm/kg (ref 300–900)

## 2020-08-12 LAB — LEGIONELLA PNEUMOPHILA SEROGP 1 UR AG: L. pneumophila Serogp 1 Ur Ag: NEGATIVE

## 2020-08-12 LAB — SODIUM, URINE, RANDOM: Sodium, Ur: 118 mmol/L

## 2020-08-12 LAB — FIBRINOGEN: Fibrinogen: 386 mg/dL (ref 210–475)

## 2020-08-12 LAB — C-REACTIVE PROTEIN: CRP: 9.9 mg/dL — ABNORMAL HIGH (ref <1.0)

## 2020-08-12 LAB — PROCALCITONIN: Procalcitonin: 0.17 ng/mL

## 2020-08-12 LAB — D-DIMER, QUANTITATIVE: D-Dimer, Quant: 5.38 ug/mL-FEU — ABNORMAL HIGH (ref 0.00–0.50)

## 2020-08-12 LAB — MAGNESIUM: Magnesium: 1.9 mg/dL (ref 1.7–2.4)

## 2020-08-12 IMAGING — DX DG CHEST 1V PORT
1 series · 1 of 1 positions shown · non-contrast
Comparison: [DATE]

CLINICAL DATA: Hypoxic. Recent negative PE evaluation in the
history of [N0] infection

EXAM:
PORTABLE CHEST 1 VIEW

[chest ap]
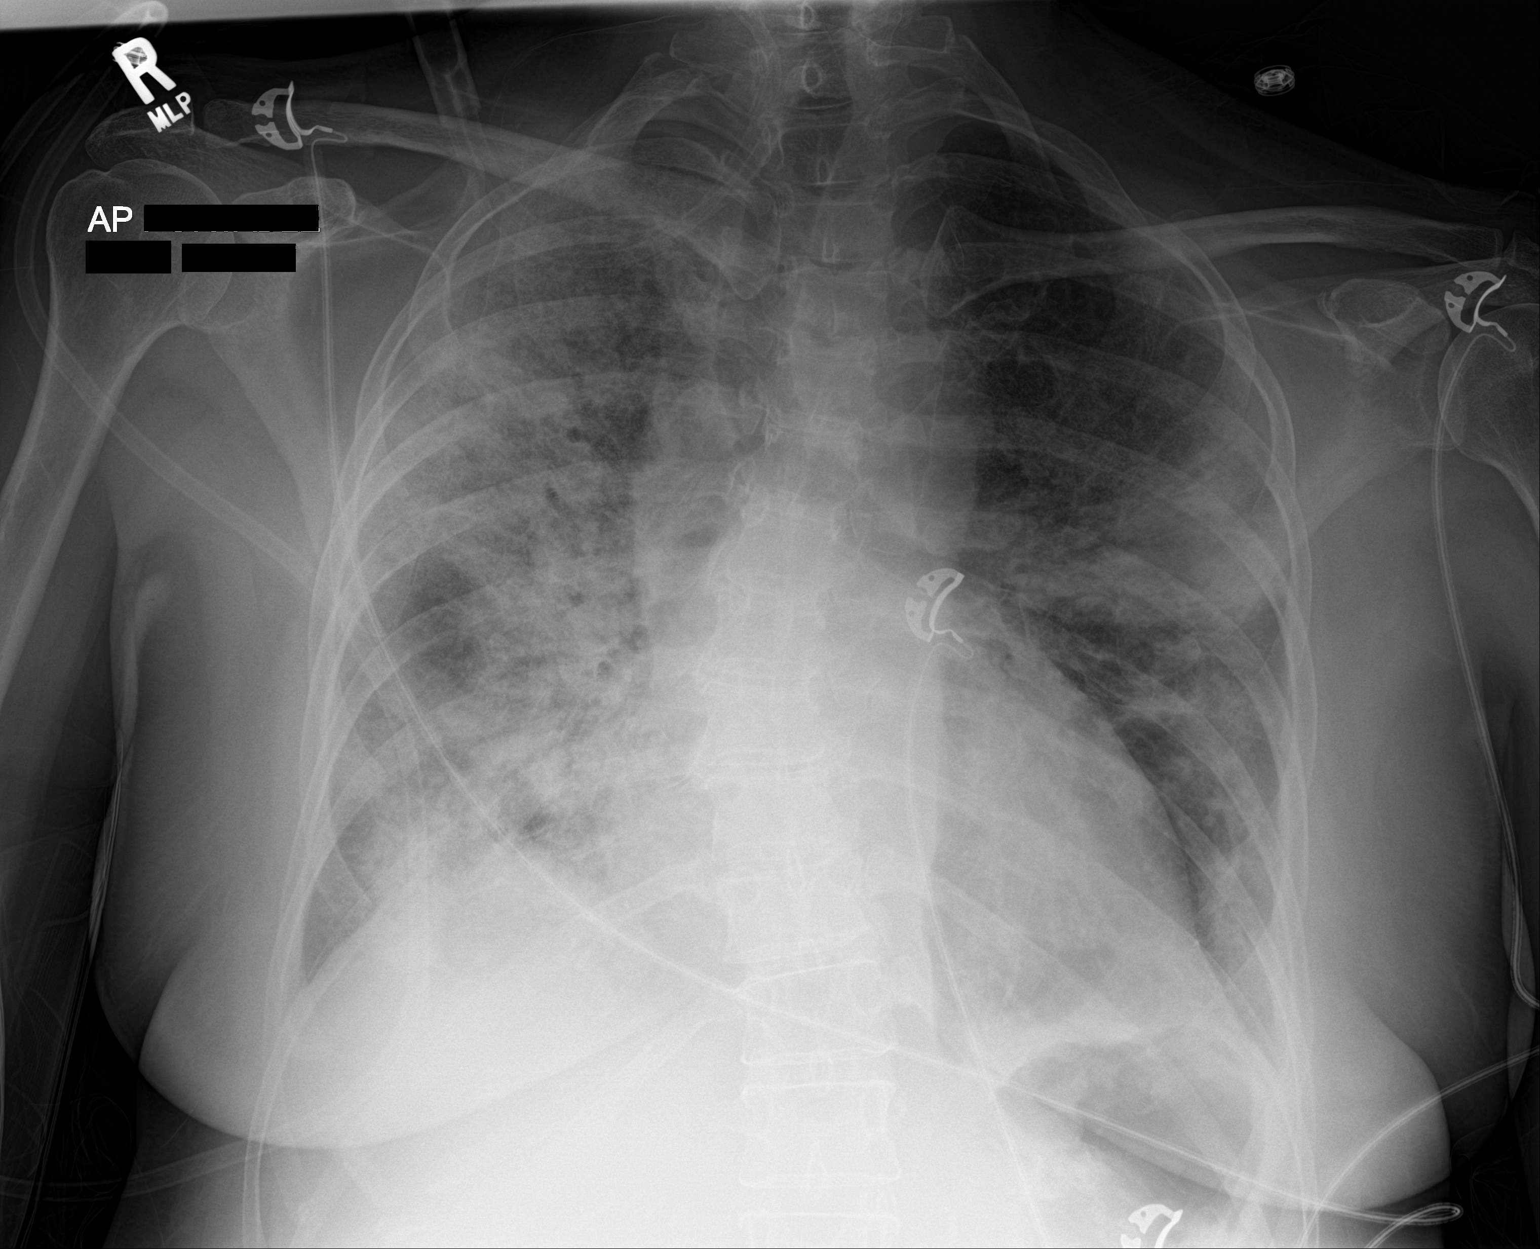

[1 of 1 positions shown; findings below may reference images not displayed]

FINDINGS: Image rotated slightly to the LEFT. Accounting for this
cardiomediastinal contours are stable

Diffuse interstitial and airspace disease has increased particularly
in the RIGHT lower lobe when compared to the prior study. Also with
increasing density of existing airspace disease since the previous
exam.

Graded opacity persists again suggestive of pleural fluid not
grossly changed.

Dextroconvex curvature of the thoracic spine. No acute skeletal
process on limited assessment.
IMPRESSION: 1. Increasing density of existing airspace disease with new areas of
basilar airspace disease worse on the RIGHT.
2. Subtle graded opacity may represent small effusions though the
predominant abnormality is airspace disease with worsening.

## 2020-08-12 MED ORDER — POTASSIUM CHLORIDE 10 MEQ/100ML IV SOLN
10.0000 meq | INTRAVENOUS | Status: AC
  Administered 2020-08-12 (×4): 10 meq via INTRAVENOUS
  Filled 2020-08-12 (×4): qty 100

## 2020-08-12 MED ORDER — SODIUM CHLORIDE 0.9% FLUSH
10.0000 mL | Freq: Two times a day (BID) | INTRAVENOUS | Status: DC
  Administered 2020-08-12 – 2020-08-24 (×24): 10 mL via INTRAVENOUS

## 2020-08-12 MED ORDER — POTASSIUM CHLORIDE CRYS ER 20 MEQ PO TBCR
20.0000 meq | EXTENDED_RELEASE_TABLET | ORAL | Status: AC
  Administered 2020-08-12 (×2): 20 meq via ORAL
  Filled 2020-08-12 (×2): qty 1

## 2020-08-12 MED ORDER — BARICITINIB 2 MG PO TABS
4.0000 mg | ORAL_TABLET | Freq: Every day | ORAL | Status: DC
  Administered 2020-08-12 – 2020-08-24 (×13): 4 mg via ORAL
  Filled 2020-08-12 (×13): qty 2

## 2020-08-12 MED ORDER — METHYLPREDNISOLONE SODIUM SUCC 125 MG IJ SOLR
80.0000 mg | Freq: Two times a day (BID) | INTRAMUSCULAR | Status: DC
  Administered 2020-08-12 – 2020-08-17 (×12): 80 mg via INTRAVENOUS
  Filled 2020-08-12 (×12): qty 2

## 2020-08-12 MED ORDER — FUROSEMIDE 10 MG/ML IJ SOLN
40.0000 mg | Freq: Once | INTRAMUSCULAR | Status: AC
  Administered 2020-08-12: 40 mg via INTRAVENOUS
  Filled 2020-08-12: qty 4

## 2020-08-12 MED ORDER — MAGNESIUM SULFATE 2 GM/50ML IV SOLN
2.0000 g | Freq: Once | INTRAVENOUS | Status: AC
  Administered 2020-08-12: 2 g via INTRAVENOUS
  Filled 2020-08-12: qty 50

## 2020-08-12 NOTE — Progress Notes (Signed)
PROGRESS NOTE  Sharon Kirk  VQM:[id number] DOB: Jul 23, 1961 DOA: 08/10/2020 PCP: Patient, No Pcp Per   Brief Narrative: Sharon Kirk is a 59 y.o. female with a history of covid-19 diagnosed 10/15 s/p monoclonal antibody 10/20 who presented to the ED 10/26 due to worsening dyspnea found to be profoundly hypoxemic. Also significantly hyponatremic (Na 109). CTA chest (due to elevated d-dimer) showed no PE, bilateral (R > L) pleural effusions, and patchy diffuse infiltrates. She was admitted to [facility] service, started on remdesivir, steroids, and empiric antibiotics for  Subsequent venous U/S revealed an acute left posterior tibial DVT for which heparin was started. Sodium has risen, thoracentesis was performed, and the patient's care was transferred to the hospitalist service 10/28. Hypoxia has worsened and progressively worsening infiltrates noted on CXR. Heated high flow oxygen is started and PCCM remains on board.   Assessment & Plan: Active Problems:   Pneumonia due to COVID-19 virus   S/P thoracentesis   Pleural effusion, right   Hyponatremia   Acute respiratory distress syndrome (ARDS) due to COVID-19 virus [facility])   Community acquired pneumonia  Acute hypoxemic respiratory failure due to ARDS due to covid-19 pneumonia: SARS-CoV-2 initially positive 10/15 s/p bamlanivimab 10/20.  - Remains at very high risk for intubation which is within the patient's goals of care. Transitioning to heated HFNC this morning. CXR this AM shows progressively intensifying infiltrates consistent with ARDS on my personal review. PCCM continues to follow the patient. - Continue remdesivir x5 days  - Augment steroids from decadron to solumedrol - Start baricitinib as covid-19-related inflammation appears to be driver of hypoxia which is the most immediately life-threatening condition for this patient at this time. No ESRD or AKI, known history of TB, severe neutropenia, lymphopenia, or severe LFT elevations. They  are not on DMARDs. The option to use/refuse baricitinib treatment under FDA authorization (not approval), the significant known and potential risks and benefits, the extent to which these are unknown, and information regarding all available alternatives were discussed in detail. Specifically the risk of VTE and secondary infections, including bacterial superinfections (antibiotics have been stopped without evidence of bacterial process. PCT 0.4 > 0.1), were discussed in detail with the patient. The patient and her husband consent to proceed with treatment.  - Encourage proning - Continue airborne, contact precautions for 21 days from positive testing. - Monitor CMP and inflammatory markers  Cor pulmonale, transudative pleural effusions: Body wall edema also noted on CT. Echo with elevated RVSP and dilated/blunted IVC. Negative gram stain on pleural fluid.  - Initiate diuresis. IV lasix ordered.  - Monitor I/O, weights.  - Monitor BMP/Na serially.   Hyponatremia: Improved.  - Continue monitoring for stability.   Acute left posterior tibial DVT: No PE on a limited CTA chest study.  - Therapeutic anticoagulation  Hypokalemia:  - Supplement and monitor.  DVT prophylaxis: IV heparin Code Status: Full Family Communication: Husband by phone this PM Disposition Plan:  Status is: Inpatient  Remains inpatient appropriate because:Inpatient level of care appropriate due to severity of illness   Dispo: The patient is from: Home              Anticipated d/c is to: TBD based on clinical trajectory              Anticipated d/c date is: > 3 days              Patient currently is not medically stable to d/c.  Consultants:   PCCM  Procedures:   Thoracentesis  Antimicrobials:  Ceftriaxone, azithromycin 10/26 - 10/27  Remdesivir 10/26 - 10/30   Subjective: Feels more short of breath which is constant, present at rest, no chest pain. Still hungry and is willing to have me call her family  but states she can update them.   Objective: Vitals:   08/12/20 0903 08/12/20 1000 08/12/20 1100 08/12/20 1200  BP:  (!) 146/92 (!) 154/94 (!) 141/88  Pulse:  90 87 84  Resp:  (!) 32 (!) 32 (!) 27  Temp:    98 F (36.7 C)  TempSrc:    Oral  SpO2: 92% (!) 86% 94% 96%  Weight:      Height:        Intake/Output Summary (Last 24 hours) at 08/12/2020 1314 Last data filed at 08/12/2020 1145 Gross per 24 hour  Intake 3238.47 ml  Output 2150 ml  Net 1088.47 ml   Filed Weights   08/10/20 1100 08/11/20 0500 08/12/20 0415  Weight: 81 kg 77.7 kg 80.6 kg   Gen: 59 y.o. female in no acute distress Pulm: Tachypneic into 30's with SpO2 in 60%'s, crackles diffusely.  CV: Regular borderline tachycardia. No murmur, rub, or gallop. No JVD, trace pedal edema. GI: Abdomen soft, non-tender, non-distended, with normoactive bowel sounds. No organomegaly or masses felt. Ext: Warm, no deformities Skin: No rashes, lesions or ulcers Neuro: Alert and oriented. No focal neurological deficits. Psych: Judgement and insight appear normal. Mood & affect appropriate.   Data Reviewed: I have personally reviewed following labs and imaging studies  CBC: Recent Labs  Lab 08/10/20 0304 08/11/20 0730 08/12/20 0508  WBC 15.0* 12.3* 15.8*  NEUTROABS 13.8*  --   --   HGB 11.2* 11.2* 10.7*  HCT 30.5* 32.3* 31.4*  MCV 77.2* 81.0 82.8  PLT 305 349 038   Basic Metabolic Panel: Recent Labs  Lab 08/11/20 0730 08/11/20 1009 08/11/20 1717 08/11/20 2254 08/12/20 0508  NA 133* 131* 133* 131* 129*  K 3.0* 3.4* 3.9 3.5 3.3*  CL 93* 95* 97* 97* 97*  CO2 28 23 27 24 23   GLUCOSE 95 158* 122* 121* 106*  BUN 10 9 11 12 11   CREATININE 0.38* 0.49 0.50 0.41* 0.37*  CALCIUM 7.8* 7.5* 7.7* 7.8* 7.6*  MG  --   --   --   --  1.9   GFR: Estimated Creatinine Clearance: 77.4 mL/min (A) (by C-G formula based on SCr of 0.37 mg/dL (L)). Liver Function Tests: Recent Labs  Lab 08/10/20 0304 08/10/20 1413  AST 29  --    ALT 37  --   ALKPHOS 125  --   BILITOT 1.0  --   PROT 6.3* 6.0*  ALBUMIN 2.6*  --    No results for input(s): LIPASE, AMYLASE in the last 168 hours. No results for input(s): AMMONIA in the last 168 hours. Coagulation Profile: No results for input(s): INR, PROTIME in the last 168 hours. Cardiac Enzymes: No results for input(s): CKTOTAL, CKMB, CKMBINDEX, TROPONINI in the last 168 hours. BNP (last 3 results) No results for input(s): PROBNP in the last 8760 hours. HbA1C: No results for input(s): HGBA1C in the last 72 hours. CBG: No results for input(s): GLUCAP in the last 168 hours. Lipid Profile: Recent Labs    08/10/20 0301 08/10/20 1413  CHOL  --  146  TRIG 86  --    Thyroid Function Tests: No results for input(s): TSH, T4TOTAL, FREET4, T3FREE, THYROIDAB in the last 72 hours. Anemia Panel: Recent  Labs    08/10/20 0304  FERRITIN 310*   Urine analysis:    Component Value Date/Time   COLORURINE YELLOW 06/11/2017 0858   APPEARANCEUR CLEAR 06/11/2017 0858   LABSPEC 1.015 06/11/2017 0858   PHURINE 6.0 06/11/2017 0858   GLUCOSEU NEGATIVE 06/11/2017 0858   HGBUR NEGATIVE 06/11/2017 0858   BILIRUBINUR NEGATIVE 06/11/2017 0858   KETONESUR NEGATIVE 06/11/2017 0858   PROTEINUR NEGATIVE 06/11/2017 0858   UROBILINOGEN 0.2 08/20/2014 0814   NITRITE NEGATIVE 06/11/2017 0858   LEUKOCYTESUR NEGATIVE 06/11/2017 0858   Recent Results (from the past 240 hour(s))  Blood Culture (routine x 2)     Status: None (Preliminary result)   Collection Time: 08/10/20  3:04 AM   Specimen: BLOOD  Result Value Ref Range Status   Specimen Description   Final    BLOOD BLOOD LEFT FOREARM Performed at [facility], [place] [address], [place], [place] [address]    Special Requests   Final    BOTTLES DRAWN AEROBIC AND ANAEROBIC Blood Culture adequate volume Performed at [facility] [address], [place], [place] [address]    Culture   Final    NO GROWTH 2  DAYS Performed at [facility] [address] [place], [place] [address]    Report Status PENDING  Incomplete  Blood Culture (routine x 2)     Status: None (Preliminary result)   Collection Time: 08/10/20  3:04 AM   Specimen: BLOOD  Result Value Ref Range Status   Specimen Description   Final    BLOOD LEFT ANTECUBITAL Performed at [facility] [address] [place], [place] [address]    Special Requests   Final    BOTTLES DRAWN AEROBIC AND ANAEROBIC Blood Culture adequate volume Performed at [facility] [address] [place], [place] [address]    Culture   Final    NO GROWTH 2 DAYS Performed at [facility] [address] [place], [place] [address]    Report Status PENDING  Incomplete  Body fluid culture     Status: None (Preliminary result)   Collection Time: 08/10/20 12:38 PM   Specimen: Thoracentesis; Body Fluid  Result Value Ref Range Status   Specimen Description   Final    THORACENTESIS RIGHT Performed at [facility] [address] [place], [place] [address]    Special Requests   Final    NONE Performed at [facility], [place] [address] [place], [place] [address]    Gram Stain   Final    RARE WBC PRESENT, PREDOMINANTLY MONONUCLEAR NO ORGANISMS SEEN    Culture   Final    NO GROWTH 2 DAYS Performed at [facility], [place] [address] [place], [place] [address]    Report Status PENDING  Incomplete  MRSA PCR Screening     Status: None   Collection Time: 08/10/20  1:30 PM   Specimen: Nasopharyngeal  Result Value Ref Range Status   MRSA by PCR NEGATIVE NEGATIVE Final    Comment:        The GeneXpert MRSA Assay (FDA approved for NASAL specimens only), is one component of a comprehensive MRSA colonization surveillance program. It is not intended to diagnose MRSA infection nor to guide or monitor treatment for MRSA infections. Performed at [facility], [place] [address], [place], [place] [address]       Radiology Studies: [facility]  Result Date: 08/12/2020 CLINICAL DATA:  Hypoxic. Recent negative PE evaluation in the history of COVID-19 infection EXAM: PORTABLE CHEST 1 VIEW COMPARISON:  08/10/2020 FINDINGS: Image rotated slightly to the LEFT. Accounting for this cardiomediastinal contours are stable Diffuse interstitial and airspace disease has increased particularly in the RIGHT lower lobe when compared to the prior study. Also with increasing density of existing airspace disease since the previous exam. Graded opacity persists again suggestive of pleural fluid not grossly changed. Dextroconvex curvature of the thoracic spine. No acute skeletal process on limited assessment. IMPRESSION: 1. Increasing density of existing airspace disease with new areas of basilar airspace disease worse on the RIGHT. 2. Subtle graded opacity may represent small effusions though the predominant abnormality is airspace disease with worsening. Electronically Signed   By: Zetta Bills M.D.   On: 08/12/2020 10:53    Scheduled Meds: . Chlorhexidine Gluconate Cloth  6 each Topical Daily  . enoxaparin (LOVENOX) injection  80 mg Subcutaneous Q12H  . mouth rinse  15 mL Mouth Rinse BID  . methylPREDNISolone (SOLU-MEDROL) injection  80 mg Intravenous Q12H   Continuous Infusions: . sodium chloride 10 mL/hr at 08/12/20 1145  . remdesivir 100 mg in NS 100 mL Stopped (08/12/20 1137)     LOS: 2 days   Time spent: 35 minutes.  Patrecia Pour, MD [facility] [email or web address] 08/12/2020, 1:14 PM

## 2020-08-12 NOTE — Progress Notes (Signed)
NAME:  Sharon Kirk, MRN:  [id number], DOB:  11/27/1960, LOS: 2 ADMISSION DATE:  08/10/2020, CONSULTATION DATE: 04/05/2020 REFERRING MD: Dr. Tomi Bamberger, CHIEF COMPLAINT: COVID-19 pneumonia  Brief History   59 year old presents with hyponatremia and Covid positive pneumonia with profound hypoxemia  -> Diagnosed on 15th, administered  bamlanivimab\etesevimab received antibiotics on 10/20, admitted 10/26 Past Medical History   . Allergy   . Cancer ([place])    Melanoma  . Hyperlipidemia   . IBS (irritable bowel syndrome)   . Osteopenia   . PONV (postoperative nausea and vomiting)   . Thyroid nodule      Significant Hospital Events   Admission 08/10/2020: Significantly hypoxic with pulse oximetry on room air in the 50s placed on high flow oxygen.  D-dimer greater than 20, CT chest obtained negative for PE did have positive lower extremity DVT on the left.  Procalcitonin initially normal.  CT along with diffuse airspace disease had consolidative changes on the right upper, middle and lower lobe.  There is also right greater than left effusions.  Started on empiric antibiotics, culture sent, IV Decadron started, IV remdesivir started, admitted to the intensive care.  Therapeutic heparin started after right sided diagnostic and therapeutic thoracentesis.  Goals of care discussed with patient she desires full code at this point. Right thora completed. 650 ml. boarderline exudate. Felt better after  10/27 antibiotics stopped, no evidence of bacterial process 10/28: Increasing oxygen requirements   Consults:  PCCM  Procedures:  Right thoracentesis: volume 650 ml. (F)LDH: 215, (S) LDH: 476, (F) protein <3, (S) protein 6; cell ct: clear, no orgs, neutrophil ct 66,   Significant Diagnostic Tests:  CT angiogram 10/26:Showing diffuse areas of patchy groundglass opacities bilateral there is consolidated lung disease in the right upper lobe, middle and right lower lobe, there is a moderate sized  right pleural effusion very small left pleural effusion no central pulmonary embolus appreciated Lower extremity Dopplers/ultrasound 10/26: There is no DVT in the right, however there was left posterior tibial DVT Echocardiogram 10/26: 1. Left ventricular ejection fraction, by estimation, is 60 to 65%, NML LV fxn, The mid-free wall of the right ventricle is hypokinetic, while apical  contractility and overall excursion of the base are normal. There is also  enhanced respiratory septal displacement. This may represent a forme frust of McConnell's sign (acute cor pulmonale). Right ventricular systolic function is normal.The inferior vena cava is dilated in size with <50% respiratory variability, suggesting right atrial pressure of 15 mmHg.   Micro Data:  10/26 blood culture x2 10/26 urine strep antigen->neg 10/26 urine Legionella antigen  Antimicrobials:  Remdesivir started 10/26>>> azith 10/26>> stopped 10/27 Rocephin 10/26>>> stopped 10/27  Interim history/subjective:  Work of breathing worse Objective   Blood pressure (Abnormal) 145/81, pulse 99, temperature 97.9 F (36.6 C), temperature source Oral, resp. rate (Abnormal) 39, height 5\' 5"  (1.651 m), weight 80.6 kg, last menstrual period 06/30/1990, SpO2 92 %.    FiO2 (%):  [100 %] 100 %   Intake/Output Summary (Last 24 hours) at 08/12/2020 1012 Last data filed at 08/12/2020 0843 Gross per 24 hour  Intake 3140.25 ml  Output 1050 ml  Net 2090.25 ml   Filed Weights   08/10/20 1100 08/11/20 0500 08/12/20 0415  Weight: 81 kg 77.7 kg 80.6 kg    Examination:  General this is a 59 year old white female she is sitting upright in bed and more tachypneic today HEENT normocephalic atraumatic no jugular venous distention appreciated Pulmonary: Tachypneic, mild accessory use,  this gets worse with any exertion.  She rapidly desaturates to 60s with removal of oxygen.  She is currently on heated high flow and also requiring supplemental  facemask Cardiac: Tachycardic regular rate and rhythm Abdomen: Soft nontender Extremities: Warm dry brisk capillary refill Neuro: Intact  Resolved Hospital Problem list   NA  Assessment & Plan:  Acute hypoxic respiratory failure in the setting of Covid pneumonia with ARDS; further c/b evidence of right heart failure by echo 10/26 -Initially thought secondary community-acquired pneumonia, this seems less likely suspect effusion more secondary to cor pulmonale -initial CT chest neg for PE-->wonder if this clot traveled from time of imaging to time of echo?? Oxygen requirements increasing now on high flow and NRB work of breathing fairly high Plan Continue supplemental oxygen with goal saturations greater than 85% she is now requiring heated high flow in addition to intermittent NRB  Encourage self proning if able  Given her work of breathing I am doubtful BiPAP will be sufficient if she declines further but we can try this  Day #3 of 5 remdesivir  Steroid day #3, changed to high-dose Solu-Medrol  Antibiotics discontinued, no evidence for infection Continue to trend inflammatory markers  Primary team considering baricitinib  After urine studies sent will give Lasix  Right greater than left pleural effusion->barely/boarderline exudative by strict light's criteria but more likely this is transudate and from Cor Pulmonale  -This seems atypical in the setting of Covid  Plan Await cultures  Fluid and electrolyte imbalance: hyponatremia sodium of 109 on admit.:  She was asymptomatic on admission, responded initially to saline suggesting element of hypovolemia.Sodium actually falling again with ongoing saline raising concern for element of SIADH which we could certainly see with her pulmonary dysfunction   Plan We will KVO IV fluids now as she is +1.1 L and taking p.o.'s Will check urine Osmo Check urine sodium After urine studies sent from a pulmonary standpoint she will benefit from Lasix  which we will administer  Hypokalemia Plan Replace and recheck  Left lower extremity DVT Plan Continuing IV heparin     Best practice:  Diet: Regular Pain/Anxiety/Delirium protocol (if indicated): N/A VAP protocol (if indicated): N/A DVT prophylaxis:therapeutic heparin  GI prophylaxis: N/A Glucose control: Monitor Mobility: Bedrest Code Status: Full Family Communication: Discussed with patient Disposition: SDU and triad.  Critical care will follow as consult    Critical care time:    Erick Colace ACNP-BC [place] Pager # [phone] OR # [phone] if no answer

## 2020-08-12 NOTE — Progress Notes (Signed)
Portable chest x-ray personally reviewed this is showing fairly significant worsening of right greater than left airspace disease Erick Colace ACNP-BC [place] Pager # [phone] OR # [phone] if no answer

## 2020-08-12 NOTE — Progress Notes (Signed)
K+ 3.3, Mg 1.9 °Replaced per protocol  °

## 2020-08-13 DIAGNOSIS — I82442 Acute embolism and thrombosis of left tibial vein: Secondary | ICD-10-CM | POA: Diagnosis not present

## 2020-08-13 DIAGNOSIS — E871 Hypo-osmolality and hyponatremia: Secondary | ICD-10-CM | POA: Diagnosis not present

## 2020-08-13 DIAGNOSIS — U071 COVID-19: Secondary | ICD-10-CM | POA: Diagnosis not present

## 2020-08-13 DIAGNOSIS — I272 Pulmonary hypertension, unspecified: Secondary | ICD-10-CM

## 2020-08-13 DIAGNOSIS — J80 Acute respiratory distress syndrome: Secondary | ICD-10-CM | POA: Diagnosis not present

## 2020-08-13 DIAGNOSIS — J90 Pleural effusion, not elsewhere classified: Secondary | ICD-10-CM | POA: Diagnosis not present

## 2020-08-13 DIAGNOSIS — I82409 Acute embolism and thrombosis of unspecified deep veins of unspecified lower extremity: Secondary | ICD-10-CM

## 2020-08-13 LAB — CBC WITH DIFFERENTIAL/PLATELET
Abs Immature Granulocytes: 0.22 10*3/uL — ABNORMAL HIGH (ref 0.00–0.07)
Basophils Absolute: 0 10*3/uL (ref 0.0–0.1)
Basophils Relative: 0 %
Eosinophils Absolute: 0 10*3/uL (ref 0.0–0.5)
Eosinophils Relative: 0 %
HCT: 33.7 % — ABNORMAL LOW (ref 36.0–46.0)
Hemoglobin: 11.4 g/dL — ABNORMAL LOW (ref 12.0–15.0)
Immature Granulocytes: 2 %
Lymphocytes Relative: 4 %
Lymphs Abs: 0.6 10*3/uL — ABNORMAL LOW (ref 0.7–4.0)
MCH: 28.1 pg (ref 26.0–34.0)
MCHC: 33.8 g/dL (ref 30.0–36.0)
MCV: 83 fL (ref 80.0–100.0)
Monocytes Absolute: 0.4 10*3/uL (ref 0.1–1.0)
Monocytes Relative: 2 %
Neutro Abs: 13.9 10*3/uL — ABNORMAL HIGH (ref 1.7–7.7)
Neutrophils Relative %: 92 %
Platelets: 365 10*3/uL (ref 150–400)
RBC: 4.06 MIL/uL (ref 3.87–5.11)
RDW: 14.8 % (ref 11.5–15.5)
WBC: 15.1 10*3/uL — ABNORMAL HIGH (ref 4.0–10.5)
nRBC: 0 % (ref 0.0–0.2)

## 2020-08-13 LAB — BASIC METABOLIC PANEL
Anion gap: 9 (ref 5–15)
BUN: 13 mg/dL (ref 8–23)
CO2: 24 mmol/L (ref 22–32)
Calcium: 7.6 mg/dL — ABNORMAL LOW (ref 8.9–10.3)
Chloride: 97 mmol/L — ABNORMAL LOW (ref 98–111)
Creatinine, Ser: 0.4 mg/dL — ABNORMAL LOW (ref 0.44–1.00)
GFR, Estimated: >60 mL/min (ref >60)
Glucose, Bld: 160 mg/dL — ABNORMAL HIGH (ref 70–99)
Potassium: 3.8 mmol/L (ref 3.5–5.1)
Sodium: 130 mmol/L — ABNORMAL LOW (ref 135–145)

## 2020-08-13 LAB — BODY FLUID CULTURE: Culture: NO GROWTH

## 2020-08-13 LAB — CHOLESTEROL, BODY FLUID: Cholesterol, Fluid: 17 mg/dL

## 2020-08-13 LAB — MAGNESIUM: Magnesium: 2.5 mg/dL — ABNORMAL HIGH (ref 1.7–2.4)

## 2020-08-13 LAB — C-REACTIVE PROTEIN: CRP: 13.3 mg/dL — ABNORMAL HIGH (ref <1.0)

## 2020-08-13 MED ORDER — HYDROCOD POLST-CPM POLST ER 10-8 MG/5ML PO SUER
5.0000 mL | Freq: Two times a day (BID) | ORAL | Status: DC
  Administered 2020-08-13 – 2020-08-24 (×23): 5 mL via ORAL
  Filled 2020-08-13 (×23): qty 5

## 2020-08-13 MED ORDER — ALBUTEROL SULFATE HFA 108 (90 BASE) MCG/ACT IN AERS: 1.0000 | INHALATION_SPRAY | RESPIRATORY_TRACT | Status: DC | PRN

## 2020-08-13 MED ORDER — PANTOPRAZOLE SODIUM 40 MG IV SOLR
40.0000 mg | Freq: Every day | INTRAVENOUS | Status: DC
  Administered 2020-08-13 – 2020-08-15 (×3): 40 mg via INTRAVENOUS
  Filled 2020-08-13 (×3): qty 40

## 2020-08-13 MED ORDER — IPRATROPIUM-ALBUTEROL 20-100 MCG/ACT IN AERS
1.0000 | INHALATION_SPRAY | Freq: Four times a day (QID) | RESPIRATORY_TRACT | Status: DC
  Administered 2020-08-13 – 2020-08-18 (×17): 1 via RESPIRATORY_TRACT
  Filled 2020-08-13 (×2): qty 4

## 2020-08-13 NOTE — Progress Notes (Signed)
PHARMACY NOTE -  Lovenox  Pharmacy has been assisting with dosing of Lovenox for VTE.  Dosage remains stable at 80 mg SQ q12 hr and need for further dosage adjustment appears unlikely at present given stable CrCl  Pharmacy will sign off, following peripherally for dose adjustments. Please reconsult if a change in clinical status warrants re-evaluation of dosage.  Reuel Boom, PharmD, BCPS [phone] 08/13/2020, 11:21 AM

## 2020-08-13 NOTE — Progress Notes (Addendum)
NAME:  Sharon Kirk, MRN:  [id number], DOB:  [birth year], LOS: 3 ADMISSION DATE:  08/10/2020, CONSULTATION DATE: 04/05/2020 REFERRING MD: Dr. Tomi Bamberger, CHIEF COMPLAINT: COVID-19 pneumonia  Brief History   59 year old presents with hyponatremia and Covid positive pneumonia with profound hypoxemia  -> Diagnosed on 15th, administered  bamlanivimab\etesevimab received antibiotics on 10/20, admitted 10/26 Past Medical History    has a past medical history of Allergy, Cancer ([place]), Hyperlipidemia, IBS (irritable bowel syndrome), Osteopenia, PONV (postoperative nausea and vomiting), and Thyroid nodule.  Significant Hospital Events   Admission 08/10/2020: Significantly hypoxic with pulse oximetry on room air in the 50s placed on high flow oxygen.  D-dimer greater than 20, CT chest obtained negative for PE did have positive lower extremity DVT on the left.  Procalcitonin initially normal.  CT along with diffuse airspace disease had consolidative changes on the right upper, middle and lower lobe.  There is also right greater than left effusions.  Started on empiric antibiotics, culture sent, IV Decadron started, IV remdesivir started, admitted to the intensive care.  Therapeutic heparin started after right sided diagnostic and therapeutic thoracentesis.  Goals of care discussed with patient she desires full code at this point. Right thora completed. 650 ml. boarderline exudate. Felt better after  10/27 antibiotics stopped, no evidence of bacterial process 10/28: Increasing oxygen requirements.  Started on baricitinib  Consults:  PCCM  Procedures:  Right thoracentesis: volume 650 ml. (F)LDH: 215, (S) LDH: 476, (F) protein <3, (S) protein 6; cell ct: clear, no orgs, neutrophil ct 66,   Significant Diagnostic Tests:  CT angiogram 10/26:Showing diffuse areas of patchy groundglass opacities bilateral there is consolidated lung disease in the right upper lobe, middle and right lower lobe, there is a moderate  sized right pleural effusion very small left pleural effusion no central pulmonary embolus appreciated Lower extremity Dopplers/ultrasound 10/26: There is no DVT in the right, however there was left posterior tibial DVT Echocardiogram 10/26: 1. Left ventricular ejection fraction, by estimation, is 60 to 65%, NML LV fxn, The mid-free wall of the right ventricle is hypokinetic, while apical  contractility and overall excursion of the base are normal. There is also  enhanced respiratory septal displacement. This may represent a forme frust of McConnell's sign (acute cor pulmonale). Right ventricular systolic function is normal.The inferior vena cava is dilated in size with <50% respiratory variability, suggesting right atrial pressure of 15 mmHg.   Micro Data:  10/26 blood culture x2 10/26 urine strep antigen->neg 10/26 urine Legionella antigen  Antimicrobials:  Remdesivir started 10/26>>> Azith 10/26>> stopped 10/27 Rocephin 10/26>>> stopped 10/27 Baricitinib 10/28  Interim history/subjective:   Continues on 100% nonrebreather and 40 L oxygen.  Not significantly worse compared to yesterday. No significant distress.  Eating breakfast in bed.  Objective   Blood pressure (!) 144/83, pulse 87, temperature 97.8 F (36.6 C), temperature source Axillary, resp. rate (!) 33, height 5\' 5"  (1.651 m), weight 73.9 kg, last menstrual period 06/30/1990, SpO2 90 %.    FiO2 (%):  [100 %] 100 %   Intake/Output Summary (Last 24 hours) at 08/13/2020 0919 Last data filed at 08/13/2020 0700 Gross per 24 hour  Intake 1027.48 ml  Output 4975 ml  Net -3947.52 ml   Filed Weights   08/11/20 0500 08/12/20 0415 08/13/20 0440  Weight: 77.7 kg 80.6 kg 73.9 kg    Examination: Gen:      No acute distress HEENT:  EOMI, sclera anicteric Neck:     No masses; no thyromegaly  Lungs:    Scattered crackles CV:         Regular rate and rhythm; no murmurs Abd:      + bowel sounds; soft, non-tender; no palpable  masses, no distension Ext:    No edema; adequate peripheral perfusion Skin:      Warm and dry; no rash Neuro: alert and oriented x 3 Psych: normal mood and affect  Resolved Hospital Problem list   NA  Assessment & Plan:  Acute hypoxic respiratory failure in the setting of Covid pneumonia with ARDS; further c/b evidence of right heart failure by echo 10/26 -Initially thought secondary community-acquired pneumonia, this seems less likely suspect effusion more secondary to cor pulmonale -initial CT chest neg for PE-->wonder if this clot traveled from time of imaging to time of echo?? Plan  Continue supplemental oxygen, self proning Can use BiPAP as needed Continue remdesivir, steroids At risk for worsening and requiring intubation. Has been started on baricitinib yesterday which hopefully will stabilize the situation  Right greater than left pleural effusion->barely/boarderline exudative by strict light's criteria but more likely this is transudate and from Cor Pulmonale  -This seems atypical in the setting of Covid  Plan Cultures negative  Fluid and electrolyte imbalance: hyponatremia sodium of 109 on admit.:  She was asymptomatic on admission, responded initially to saline suggesting element of hypovolemia.Sodium actually falling again with ongoing saline raising concern for element of SIADH which we could certainly see with her pulmonary dysfunction   Plan Monitor labs  Left lower extremity DVT Plan On therapeutic Lovenox  PCCM will be available as needed over the weekend.  Please call with any questions  Best practice:  Diet: Regular Pain/Anxiety/Delirium protocol (if indicated): N/A VAP protocol (if indicated): N/A DVT prophylaxis: Lovenox GI prophylaxis: N/A Glucose control: Monitor Mobility: Bedrest Code Status: Full Family Communication: Per primary team Disposition: SDU and triad.  Critical care will follow as consult  Critical care time:    The patient is  critically ill with multiple organ system failure and requires high complexity decision making for assessment and support, frequent evaluation and titration of therapies, advanced monitoring, review of radiographic studies and interpretation of complex data.   Critical Care Time devoted to patient care services, exclusive of separately billable procedures, described in this note is 35 minutes.   Marshell Garfinkel MD [facility] Please see [email or web address] for pager details.  08/13/2020, 9:28 AM

## 2020-08-13 NOTE — Progress Notes (Signed)
PROGRESS NOTE    LEEA RAMBEAU  IOX:[id number] DOB: May 25, 1961 DOA: 08/10/2020 PCP: Patient, No Pcp Per    Brief Narrative:  Mrs. Fern was admitted to the hospital with the working diagnosis of acute hypoxic respiratory failure due to SARS COVID-19 viral pneumonia.  59 year old female who presented with worsening dyspnea.  She was diagnosed with COVID-19 10/15, she was treated with antibiotic therapy 10/20.  Despite outpatient medical therapy she continued to deteriorate, prompting her to come to the hospital.  On her initial physical examination her oximetry was 51% on room air, blood pressure 128/85, heart rate 82, temperature 97.7, respiratory rate 19.  Her lungs had coarse breath sounds bilaterally, heart S1-S2, present, tachycardic, abdomen soft, no lower extremity edema. Her sodium 109, potassium 3.4, chloride 70, bicarb 24, glucose 141, BUN 8, creatinine 0.39, white count 15.0, hemoglobin 11.2, hematocrit 30.5, platelets 305.  D-dimer greater than 20.  Chest radiograph with bilateral infiltrates, dense interstitial, alveolar, on the left upper lobe, right upper lobe and right lower lobe. CT chest with bilateral pulmonary infiltrates, dense, moderate right pleural effusion.  No proximal pulmonary embolism.  EKG 84 bpm, left axis deviation, left anterior fascicular block, right bundle branch block, prolonged QTc, sinus rhythm, no ST segment or significant T wave changes, low voltage.  Patient was admitted to the intensive care unit, she received medical therapy with systemic corticosteroids, baricitinib and remdesivir.  Supplemental oxygen per heated high flow nasal cannula along with nonrebreather mask.  Patient underwent right-sided thoracentesis, obtaining 650 cc of cloudy yellow fluid.  Further work-up with ultrasonography lower extremities was positive for deep venous thrombosis involving the left posterior tibial vein. Her electrolytes were corrected and she was anticoagulated  with heparin.  Patient transferred to [facility] 10/28.  Assessment & Plan:   Principal Problem:   Pneumonia due to COVID-19 virus Active Problems:   S/P thoracentesis   Pleural effusion, right   Hyponatremia   Acute respiratory distress syndrome (ARDS) due to COVID-19 virus (HCC)   DVT (deep venous thrombosis) ([place])   Pulmonary hypertension ([place])   1.  Acute hypoxic respiratory failure due to SARS COVID-19 viral pneumonia.  RR:33 Pulse oxymetry: 90  Fi02: 40 L/ min 100% FiO2 per HFNC plus NRBM   COVID-19 Labs  Recent Labs    08/10/20 1413 08/11/20 0730 08/12/20 0508 08/13/20 0234  DDIMER  --  5.58* 5.38*  --   LDH 476*  --   --   --   CRP  --  9.7* 9.9* 13.3*    No results found for: [place]  Patient continue to have high inflammatory markers and high oxygen requirements.   Continue medical therapy with baricitinib, methylprednisolone and remdesivir #4/5. Antitussive agents, bronchodilators and airway clearing techniques with flutter valve and incentive spirometer. Sp diuresis 40 mg IV (10/28).   Encourage to stay prone for 4 H at the time for a total of 16 H per day. Out of bed to chair tid with meals as tolerated.  Keep oxygen saturation more than 75%, close monitoring of work of breathing and mentation.   2. Hypovolemic hyponatremia/ hypokalemia. Na is 130, K 3,8 and bicarbonate at 24. Mg 2,5. Continue close follow up on electrolytes. Renal functions stable with serum cr at 0,40.  Hold on further IV fluids for now. Continue to encourage po intake. Follow up on renal function in am.   3. Acute core pulmonale/ acute cardiogenic pulmonary edema/ bilateral pleural effusions, more right than left. / high pretest-probability for  PE.  LDH ratio is 0.45 and Protein ration is 0.46 consistent with transudate effusion per Light's criteria. Her urine output over last 24 H is 4,975 ml.  Echocardiogram with preserved LV systolic function 60 to 64%.  RV is hypokinetic at the mid  free wall compared to apical zone, McConnell's signs suggestive of RV overload.  Positive elevated RV pressure.   Continue therapeutic anticoagulation. Follow up chest film in am after diuresis.  Blood pressure today 144/83 mmHg.  4. Left lower extremity DVT. Left posterior tibial vein thrombosis, continue antithrombotic therapy with full dose enoxaparin.   5. Overweight. Calculated BMI is 27.1  6. Reactive leukocytosis. Wbc is 15,1 stable, hold on antibiotic therapy for now. Close monitoring of cell count and oxygen requirements.   Patient continue to be at high risk for worsening respiratory failure.   Status is: Inpatient  Remains inpatient appropriate because:IV treatments appropriate due to intensity of illness or inability to take PO   Dispo: The patient is from: Home              Anticipated d/c is to: Home              Anticipated d/c date is: > 3 days              Patient currently is not medically stable to d/c. Patient stable for progressive care status    DVT prophylaxis: Enoxaparin therapeutic   Code Status:    full Family Communication:  I spoke over the phone with the patient's husband about patient's  condition, plan of care, prognosis and all questions were addressed.    Consultants:   Pulmonary   Procedures:   Right thoracentesis       Subjective: Patient reports stable dyspnea, she has oxygen desaturation with eating, no nausea or vomiting, no chest pain. Very weak and deconditioned.   Objective: Vitals:   08/13/20 0600 08/13/20 0700 08/13/20 0811 08/13/20 0838  BP: (!) 166/139 (!) 144/83    Pulse: 91 87    Resp: (!) 29 (!) 33    Temp:   97.8 F (36.6 C)   TempSrc:   Axillary   SpO2: (!) 88% (!) 83%  90%  Weight:      Height:        Intake/Output Summary (Last 24 hours) at 08/13/2020 0852 Last data filed at 08/13/2020 0700 Gross per 24 hour  Intake 1027.48 ml  Output 4975 ml  Net -3947.52 ml   Filed Weights   08/11/20 0500  08/12/20 0415 08/13/20 0440  Weight: 77.7 kg 80.6 kg 73.9 kg    Examination:   General: positive dyspnea at rest,. Very weak and deconditioned  Neurology: Awake and alert, non focal  E ENT: positive pallor, no icterus, oral mucosa moist Cardiovascular: No JVD. S1-S2 present, rhythmic, No lower extremity edema. Pulmonary: positive breath sounds bilaterally, bilateral rales lower 2/3 bilaterally. Gastrointestinal. Abdomen soft and non tender Skin. No rashes Musculoskeletal: no joint deformities     Data Reviewed: I have personally reviewed following labs and imaging studies  CBC: Recent Labs  Lab 08/10/20 0304 08/11/20 0730 08/12/20 0508 08/13/20 0234  WBC 15.0* 12.3* 15.8* 15.1*  NEUTROABS 13.8*  --   --  13.9*  HGB 11.2* 11.2* 10.7* 11.4*  HCT 30.5* 32.3* 31.4* 33.7*  MCV 77.2* 81.0 82.8 83.0  PLT 305 349 342 403   Basic Metabolic Panel: Recent Labs  Lab 08/11/20 1717 08/11/20 2254 08/12/20 0508 08/12/20 1701 08/13/20 0234  NA  133* 131* 129* 130* 130*  K 3.9 3.5 3.3* 3.5 3.8  CL 97* 97* 97* 93* 97*  CO2 27 24 23 26 24   GLUCOSE 122* 121* 106* 143* 160*  BUN 11 12 11 9 13   CREATININE 0.50 0.41* 0.37* 0.43* 0.40*  CALCIUM 7.7* 7.8* 7.6* 7.8* 7.6*  MG  --   --  1.9  --  2.5*   GFR: Estimated Creatinine Clearance: 74.4 mL/min (A) (by C-G formula based on SCr of 0.4 mg/dL (L)). Liver Function Tests: Recent Labs  Lab 08/10/20 0304 08/10/20 1413  AST 29  --   ALT 37  --   ALKPHOS 125  --   BILITOT 1.0  --   PROT 6.3* 6.0*  ALBUMIN 2.6*  --    No results for input(s): LIPASE, AMYLASE in the last 168 hours. No results for input(s): AMMONIA in the last 168 hours. Coagulation Profile: No results for input(s): INR, PROTIME in the last 168 hours. Cardiac Enzymes: No results for input(s): CKTOTAL, CKMB, CKMBINDEX, TROPONINI in the last 168 hours. BNP (last 3 results) No results for input(s): PROBNP in the last 8760 hours. HbA1C: No results for input(s):  HGBA1C in the last 72 hours. CBG: No results for input(s): GLUCAP in the last 168 hours. Lipid Profile: Recent Labs    08/10/20 1413  CHOL 146   Thyroid Function Tests: No results for input(s): TSH, T4TOTAL, FREET4, T3FREE, THYROIDAB in the last 72 hours. Anemia Panel: No results for input(s): VITAMINB12, FOLATE, FERRITIN, TIBC, IRON, RETICCTPCT in the last 72 hours.    Radiology Studies: I have reviewed all of the imaging during this hospital visit personally     Scheduled Meds: . baricitinib  4 mg Oral Daily  . Chlorhexidine Gluconate Cloth  6 each Topical Daily  . enoxaparin (LOVENOX) injection  80 mg Subcutaneous Q12H  . mouth rinse  15 mL Mouth Rinse BID  . methylPREDNISolone (SOLU-MEDROL) injection  80 mg Intravenous Q12H  . sodium chloride flush  10 mL Intravenous Q12H   Continuous Infusions: . sodium chloride 10 mL/hr at 08/13/20 0700  . remdesivir 100 mg in NS 100 mL Stopped (08/12/20 1137)     LOS: 3 days        Decie Verne Gerome Apley, MD

## 2020-08-14 ENCOUNTER — Inpatient Hospital Stay (HOSPITAL_COMMUNITY): Payer: BC Managed Care – PPO

## 2020-08-14 DIAGNOSIS — U071 COVID-19: Secondary | ICD-10-CM | POA: Diagnosis not present

## 2020-08-14 DIAGNOSIS — E871 Hypo-osmolality and hyponatremia: Secondary | ICD-10-CM | POA: Diagnosis not present

## 2020-08-14 DIAGNOSIS — I82442 Acute embolism and thrombosis of left tibial vein: Secondary | ICD-10-CM | POA: Diagnosis not present

## 2020-08-14 DIAGNOSIS — J90 Pleural effusion, not elsewhere classified: Secondary | ICD-10-CM | POA: Diagnosis not present

## 2020-08-14 LAB — COMPREHENSIVE METABOLIC PANEL
ALT: 63 U/L — ABNORMAL HIGH (ref 0–44)
AST: 22 U/L (ref 15–41)
Albumin: 2.4 g/dL — ABNORMAL LOW (ref 3.5–5.0)
Alkaline Phosphatase: 122 U/L (ref 38–126)
Anion gap: 9 (ref 5–15)
BUN: 20 mg/dL (ref 8–23)
CO2: 26 mmol/L (ref 22–32)
Calcium: 8.1 mg/dL — ABNORMAL LOW (ref 8.9–10.3)
Chloride: 101 mmol/L (ref 98–111)
Creatinine, Ser: 0.49 mg/dL (ref 0.44–1.00)
GFR, Estimated: >60 mL/min (ref >60)
Glucose, Bld: 146 mg/dL — ABNORMAL HIGH (ref 70–99)
Potassium: 3.8 mmol/L (ref 3.5–5.1)
Sodium: 136 mmol/L (ref 135–145)
Total Bilirubin: 0.5 mg/dL (ref 0.3–1.2)
Total Protein: 5.6 g/dL — ABNORMAL LOW (ref 6.5–8.1)

## 2020-08-14 LAB — D-DIMER, QUANTITATIVE: D-Dimer, Quant: 3.52 ug/mL-FEU — ABNORMAL HIGH (ref 0.00–0.50)

## 2020-08-14 LAB — FERRITIN: Ferritin: 333 ng/mL — ABNORMAL HIGH (ref 11–307)

## 2020-08-14 LAB — C-REACTIVE PROTEIN: CRP: 6.6 mg/dL — ABNORMAL HIGH (ref <1.0)

## 2020-08-14 IMAGING — DX DG CHEST 1V
1 series · 1 of 1 positions shown · non-contrast
Comparison: [DATE] and older studies.

CLINICAL DATA: Short of breath.  Dyspnea.  Follow-up exam.

EXAM:
CHEST  1 VIEW

[chest ap]
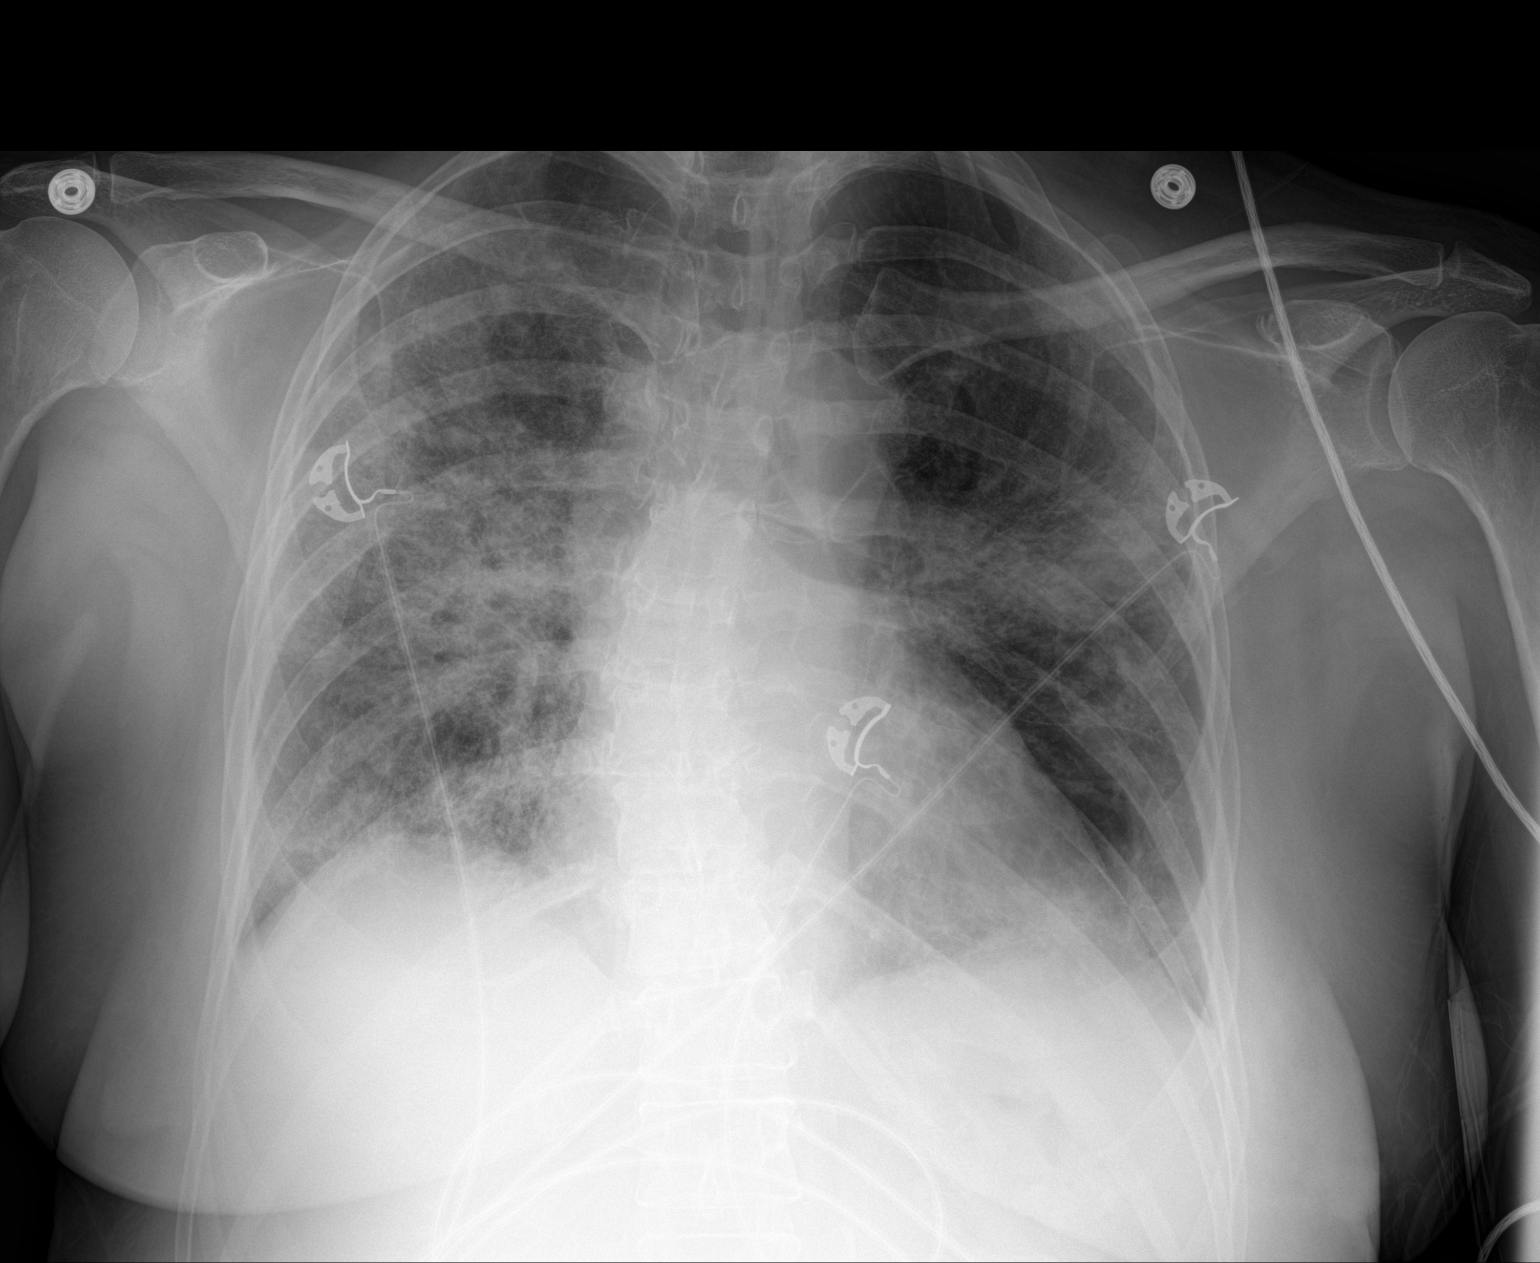

[1 of 1 positions shown; findings below may reference images not displayed]

FINDINGS: Mild interval improvement. Airspace lung opacities are slightly less
confluent/dense bilaterally. There are no new lung abnormalities.

No pneumothorax.
IMPRESSION: Mild interval improvement in bilateral airspace lung opacities since
the most recent chest radiograph. No new abnormalities.

## 2020-08-14 MED ORDER — AYR SALINE NASAL NA GEL
1.0000 "application " | NASAL | Status: DC | PRN
  Filled 2020-08-14 (×2): qty 14.1

## 2020-08-14 MED ORDER — ENOXAPARIN SODIUM 80 MG/0.8ML ~~LOC~~ SOLN
70.0000 mg | Freq: Two times a day (BID) | SUBCUTANEOUS | Status: DC
  Administered 2020-08-14 – 2020-08-22 (×17): 70 mg via SUBCUTANEOUS
  Filled 2020-08-14 (×7): qty 0.7
  Filled 2020-08-14: qty 0.8
  Filled 2020-08-14 (×4): qty 0.7
  Filled 2020-08-14: qty 0.8
  Filled 2020-08-14 (×2): qty 0.7
  Filled 2020-08-14: qty 0.8
  Filled 2020-08-14 (×2): qty 0.7

## 2020-08-14 MED ORDER — SALINE SPRAY 0.65 % NA SOLN
1.0000 | NASAL | Status: DC | PRN
  Filled 2020-08-14 (×2): qty 44

## 2020-08-14 NOTE — Progress Notes (Signed)
PROGRESS NOTE    Sharon Kirk  KZS:[id number] DOB: 02-06-1961 DOA: 08/10/2020 PCP: Patient, No Pcp Per    Brief Narrative:  Sharon Kirk was admitted to the hospital with the working diagnosis of acute hypoxic respiratory failure due to SARS COVID-19 viral pneumonia.  59 year old female who presented with worsening dyspnea.  She was diagnosed with COVID-19 10/15, she was treated with antibiotic therapy 10/20.  Despite outpatient medical therapy she continued to deteriorate, prompting her to come to the hospital.  On her initial physical examination her oximetry was 51% on room air, blood pressure 128/85, heart rate 82, temperature 97.7, respiratory rate 19.  Her lungs had coarse breath sounds bilaterally, heart S1-S2, present, tachycardic, abdomen soft, no lower extremity edema. Her sodium 109, potassium 3.4, chloride 70, bicarb 24, glucose 141, BUN 8, creatinine 0.39, white count 15.0, hemoglobin 11.2, hematocrit 30.5, platelets 305.  D-dimer greater than 20.  Chest radiograph with bilateral infiltrates, dense interstitial, alveolar, on the left upper lobe, right upper lobe and right lower lobe. CT chest with bilateral pulmonary infiltrates, dense, moderate right pleural effusion.  No proximal pulmonary embolism.  EKG 84 bpm, left axis deviation, left anterior fascicular block, right bundle branch block, prolonged QTc, sinus rhythm, no ST segment or significant T wave changes, low voltage.  Patient was admitted to the intensive care unit, she received medical therapy with systemic corticosteroids, baricitinib and remdesivir.  Supplemental oxygen per heated high flow nasal cannula along with nonrebreather mask.  Patient underwent right-sided thoracentesis, obtaining 650 cc of cloudy yellow fluid.  Further work-up with ultrasonography lower extremities was positive for deep venous thrombosis involving the left posterior tibial vein. Her electrolytes were corrected and she was  anticoagulated with heparin.  Patient transferred to [facility] 10/28.  She has responded well to diuresis.    Assessment & Plan:   Principal Problem:   Pneumonia due to COVID-19 virus Active Problems:   S/P thoracentesis   Pleural effusion, right   Hyponatremia   Acute respiratory distress syndrome (ARDS) due to COVID-19 virus (HCC)   DVT (deep venous thrombosis) ([place])   Pulmonary hypertension ([place])    1.  Acute hypoxic respiratory failure due to SARS COVID-19 viral pneumonia. Sp Remdesivir #5/5, diuresis 40 mg IV (10/28)  RR: 16  Pulse oxymetry: 95%  Fi02: 100% 40 L/min per HFNC + NRBM   COVID-19 Labs  Recent Labs    08/12/20 0508 08/13/20 0234 08/14/20 0244  DDIMER 5.38*  --  3.52*  FERRITIN  --   --  333*  CRP 9.9* 13.3* 6.6*    No results found for: SARSCOV2NAA   High inflammatory markers, chest film today personally reviewed with bilateral infiltrates, upper lobes and right lower lobe, predominantly interstitial, improved edema. Urine output over last 24 H is 900 cc.   Trial off non rebreather mask today, keep oxygen saturation more than 75%.   Tolerating well baricitinib and methylprednisolone 80 mg Iv q12. Continue with antitussive agents, bronchodilators and airway clearing techniques with flutter valve and incentive spirometer.  Continue to encourage to stay prone for 4 H at the time for a total of 16 H per day and out of bed to chair tid with meals as tolerated.   Monitor work of breathing and mentation.   2. Hypovolemic hyponatremia/ hypokalemia. Stable renal function with serum cr at 0,49, Na is up to 136, K 3,8 and bicarbonate at 26. Patient is tolerating po well, continue to hold on IV fluids, follow up on renal  function and electrolytes in am.   3. Acute core pulmonale/ acute cardiogenic pulmonary edema/ bilateral pleural effusions, more right than left. / high pretest-probability for PE.  LDH ratio is 0.45 and Protein ration is 0.46 consistent  with transudate effusion per Light's criteria. Echocardiogram with preserved LV systolic function 60 to 60%.  RV is hypokinetic at the mid free wall compared to apical zone, McConnell's sign suggestive of RV overload.  Positive elevated RV pressure.   Full dose antithrombotic therapy with enoxaparin, will hold on diuresis for today. Patient not stable to travel for CT chest, considering her current condition will assume she has pulmonary embolisms.   4. Left lower extremity DVT. Left posterior tibial vein thrombosis.  Tolerating well anticoagulation with enoxaparin, dosing per pharmacy protocol.  5. Overweight. Calculated BMI is 27.1. Encourage mobility.   6. Reactive leukocytosis. Continue to hold on antibiotic therapy, no evidence of bacterial infection.    Patient continue to be at high risk for worsening respiratory failure.   Status is: Inpatient  Remains inpatient appropriate because:IV treatments appropriate due to intensity of illness or inability to take PO   Dispo: The patient is from: Home              Anticipated d/c is to: Home              Anticipated d/c date is: > 3 days              Patient currently is not medically stable to d/c.   DVT prophylaxis: Enoxaparin   Code Status:   full  Family Communication:   I spoke over the phone with the patient's husband about patient's  condition, plan of care, prognosis and all questions were addressed.  Consultants:   Pulmonary   Procedures:   Right thoracentesis    Subjective: This am patient is feeling better, but not yet back to baseline, continue to have dyspnea with minimal efforts, no nausea or vomiting, no chest pain, tolerating po well.   Objective: Vitals:   08/14/20 0500 08/14/20 0600 08/14/20 0700 08/14/20 0800  BP: 140/77 139/76 117/67 119/66  Pulse: 83 85 79 81  Resp: (!) 24 18 (!) 21 20  Temp:      TempSrc:      SpO2: 95% 95% 98% 100%  Weight:      Height:        Intake/Output Summary  (Last 24 hours) at 08/14/2020 0823 Last data filed at 08/14/2020 0458 Gross per 24 hour  Intake 844.78 ml  Output 900 ml  Net -55.22 ml   Filed Weights   08/12/20 0415 08/13/20 0440 08/14/20 0400  Weight: 80.6 kg 73.9 kg 70.6 kg    Examination:   General: deconditioned and ill looking appearing, dyspnea at rest.  Neurology: Awake and alert, non focal  E ENT: no pallor, no icterus, oral mucosa moist Cardiovascular: No JVD. S1-S2 present, rhythmic, no gallops, rubs, or murmurs. No lower extremity edema. Pulmonary: positive breath sounds bilaterally, with no wheezing. Gastrointestinal. Abdomen soft and non tender Skin. No rashes Musculoskeletal: no joint deformities     Data Reviewed: I have personally reviewed following labs and imaging studies  CBC: Recent Labs  Lab 08/10/20 0304 08/11/20 0730 08/12/20 0508 08/13/20 0234  WBC 15.0* 12.3* 15.8* 15.1*  NEUTROABS 13.8*  --   --  13.9*  HGB 11.2* 11.2* 10.7* 11.4*  HCT 30.5* 32.3* 31.4* 33.7*  MCV 77.2* 81.0 82.8 83.0  PLT 305 349 342 365  Basic Metabolic Panel: Recent Labs  Lab 08/11/20 2254 08/12/20 0508 08/12/20 1701 08/13/20 0234 08/14/20 0244  NA 131* 129* 130* 130* 136  K 3.5 3.3* 3.5 3.8 3.8  CL 97* 97* 93* 97* 101  CO2 24 23 26 24 26   GLUCOSE 121* 106* 143* 160* 146*  BUN 12 11 9 13 20   CREATININE 0.41* 0.37* 0.43* 0.40* 0.49  CALCIUM 7.8* 7.6* 7.8* 7.6* 8.1*  MG  --  1.9  --  2.5*  --    GFR: Estimated Creatinine Clearance: 72.7 mL/min (by C-G formula based on SCr of 0.49 mg/dL). Liver Function Tests: Recent Labs  Lab 08/10/20 0304 08/10/20 1413 08/14/20 0244  AST 29  --  22  ALT 37  --  63*  ALKPHOS 125  --  122  BILITOT 1.0  --  0.5  PROT 6.3* 6.0* 5.6*  ALBUMIN 2.6*  --  2.4*   No results for input(s): LIPASE, AMYLASE in the last 168 hours. No results for input(s): AMMONIA in the last 168 hours. Coagulation Profile: No results for input(s): INR, PROTIME in the last 168 hours.  Cardiac Enzymes: No results for input(s): CKTOTAL, CKMB, CKMBINDEX, TROPONINI in the last 168 hours. BNP (last 3 results) No results for input(s): PROBNP in the last 8760 hours. HbA1C: No results for input(s): HGBA1C in the last 72 hours. CBG: No results for input(s): GLUCAP in the last 168 hours. Lipid Profile: No results for input(s): CHOL, HDL, LDLCALC, TRIG, CHOLHDL, LDLDIRECT in the last 72 hours. Thyroid Function Tests: No results for input(s): TSH, T4TOTAL, FREET4, T3FREE, THYROIDAB in the last 72 hours. Anemia Panel: Recent Labs    08/14/20 0244  FERRITIN 333*      Radiology Studies: I have reviewed all of the imaging during this hospital visit personally     Scheduled Meds: . baricitinib  4 mg Oral Daily  . Chlorhexidine Gluconate Cloth  6 each Topical Daily  . chlorpheniramine-HYDROcodone  5 mL Oral Q12H  . enoxaparin (LOVENOX) injection  80 mg Subcutaneous Q12H  . Ipratropium-Albuterol  1 puff Inhalation QID  . mouth rinse  15 mL Mouth Rinse BID  . methylPREDNISolone (SOLU-MEDROL) injection  80 mg Intravenous Q12H  . pantoprazole (PROTONIX) IV  40 mg Intravenous Daily  . sodium chloride flush  10 mL Intravenous Q12H   Continuous Infusions: . remdesivir 100 mg in NS 100 mL Stopped (08/13/20 0933)     LOS: 4 days        Mauricio Gerome Apley, MD

## 2020-08-15 LAB — CULTURE, BLOOD (ROUTINE X 2)
Culture: NO GROWTH
Culture: NO GROWTH
Special Requests: ADEQUATE
Special Requests: ADEQUATE

## 2020-08-15 LAB — COMPREHENSIVE METABOLIC PANEL
ALT: 50 U/L — ABNORMAL HIGH (ref 0–44)
AST: 18 U/L (ref 15–41)
Albumin: 2.5 g/dL — ABNORMAL LOW (ref 3.5–5.0)
Alkaline Phosphatase: 121 U/L (ref 38–126)
Anion gap: 11 (ref 5–15)
BUN: 19 mg/dL (ref 8–23)
CO2: 28 mmol/L (ref 22–32)
Calcium: 8.2 mg/dL — ABNORMAL LOW (ref 8.9–10.3)
Chloride: 99 mmol/L (ref 98–111)
Creatinine, Ser: 0.42 mg/dL — ABNORMAL LOW (ref 0.44–1.00)
GFR, Estimated: >60 mL/min (ref >60)
Glucose, Bld: 143 mg/dL — ABNORMAL HIGH (ref 70–99)
Potassium: 4.3 mmol/L (ref 3.5–5.1)
Sodium: 138 mmol/L (ref 135–145)
Total Bilirubin: 0.7 mg/dL (ref 0.3–1.2)
Total Protein: 5.6 g/dL — ABNORMAL LOW (ref 6.5–8.1)

## 2020-08-15 LAB — GLUCOSE, CAPILLARY: Glucose-Capillary: 205 mg/dL — ABNORMAL HIGH (ref 70–99)

## 2020-08-15 LAB — C-REACTIVE PROTEIN: CRP: 3.4 mg/dL — ABNORMAL HIGH (ref <1.0)

## 2020-08-15 LAB — D-DIMER, QUANTITATIVE: D-Dimer, Quant: 3.39 ug/mL-FEU — ABNORMAL HIGH (ref 0.00–0.50)

## 2020-08-15 LAB — FERRITIN: Ferritin: 306 ng/mL (ref 11–307)

## 2020-08-15 MED ORDER — PANTOPRAZOLE SODIUM 40 MG PO TBEC
40.0000 mg | DELAYED_RELEASE_TABLET | Freq: Every day | ORAL | Status: DC
  Administered 2020-08-16 – 2020-08-24 (×9): 40 mg via ORAL
  Filled 2020-08-15 (×9): qty 1

## 2020-08-15 NOTE — Progress Notes (Addendum)
Patient ID: Sharon Kirk, female   DOB: 07-Nov-1960, 59 y.o.   MRN: [id number]  PROGRESS NOTE    Sharon Kirk  MBE:[id number] DOB: April 28, 1961 DOA: 08/10/2020 PCP: Patient, No Pcp Per    Brief Narrative:  Sharon Kirk is a 59 y.o. female with a history of covid-19 diagnosed 10/15 s/p monoclonal antibody 10/20 who presented to the ED 10/26 due to worsening dyspnea found to be profoundly hypoxemic. Also significantly hyponatremic (Na 109). CTA chest (due to elevated d-dimer) showed no PE, bilateral (R > L) pleural effusions, and patchy diffuse infiltrates. She was admitted to [facility] service, started on remdesivir, steroids, and empiric antibiotics for  Subsequent venous U/S revealed an acute left posterior tibial DVT for which heparin was started. Sodium has risen, thoracentesis was performed, and the patient's care was transferred to the hospitalist service 10/28. Hypoxia has worsened and progressively worsening infiltrates noted on CXR. Heated high flow oxygen is started and PCCM remains on board.    Assessment & Plan:   Principal Problem:   Pneumonia due to COVID-19 virus Active Problems:   S/P thoracentesis   Pleural effusion, right   Hyponatremia   Acute respiratory distress syndrome (ARDS) due to COVID-19 virus [facility])   DVT (deep venous thrombosis) (HCC)   Pulmonary hypertension (HCC)   Acute hypoxemic respiratory failure due to ARDS due to covid-19 pneumonia: SARS-CoV-2 initially positive 10/15 s/p bamlanivimab 10/20.  - Remains at very high risk for intubation which is within the patient's goals of care. Transitioning to heated HFNC this morning. PCCM continues to follow the patient. - s/p remdesivir x5 days  - Solu-Medrol 80 mg IV every 12 - Continue baricitinib  -Continue antitussives, bronchodilators and airway clearing techniques with flutter valve and incentive spirometer. - Encourage proning - Continue airborne, contact precautions for 21 days from positive testing. -  Monitor CMP and inflammatory markers-trending down today  Cor pulmonale, transudative pleural effusions: Body wall edema also noted on CT. Echo with elevated RVSP and dilated/blunted IVC. Negative gram stain on pleural fluid.  - Initiate diuresis. IV lasix ordered.  - Monitor I/O, weights.  - Monitor BMP/Na serially.  Sodium is normal at 138 today creatinine also normal at 0.42  Hyponatremia: Improved.  - Continue monitoring for stability.   Acute left posterior tibial DVT: No PE on a limited CTA chest study.  - Therapeutic anticoagulation  Hypokalemia:  - Supplement and monitor.  DVT prophylaxis: Lovenox SQ Code Status: Full code  Family Communication: Patient at bedside, husband by phone Disposition Plan: home Remains inpatient due to ongoing oxygen requirements, IV meds   Consultants:   Critical care medicine  Procedures:  Thoracentesis  Antimicrobials: Anti-infectives (From admission, onward)   Start     Dose/Rate Route Frequency Ordered Stop   08/11/20 1000  remdesivir 100 mg in sodium chloride 0.9 % 100 mL IVPB       "Followed by" Linked Group Details   100 mg 200 mL/hr over 30 Minutes Intravenous Daily 08/10/20 0719 08/14/20 1104   08/10/20 0800  cefTRIAXone (ROCEPHIN) 2 g in sodium chloride 0.9 % 100 mL IVPB  Status:  Discontinued        2 g 200 mL/hr over 30 Minutes Intravenous Every 24 hours 08/10/20 0703 08/11/20 1056   08/10/20 0800  azithromycin (ZITHROMAX) 500 mg in sodium chloride 0.9 % 250 mL IVPB  Status:  Discontinued        500 mg 250 mL/hr over 60 Minutes Intravenous Every 24 hours 08/10/20  0703 08/11/20 1056   08/10/20 0730  remdesivir 200 mg in sodium chloride 0.9% 250 mL IVPB       "Followed by" Linked Group Details   200 mg 580 mL/hr over 30 Minutes Intravenous Once 08/10/20 0719 08/10/20 0953       Subjective: Continues to report mild improvement.  Objective: Vitals:   08/15/20 1100 08/15/20 1200 08/15/20 1300 08/15/20 1359  BP:  134/75 (!) 142/93 (!) 145/86   Pulse: (!) 102 78 97   Resp: (!) 28 17 (!) 33   Temp:      TempSrc:      SpO2: (!) 80% 90% 92% (!) 89%  Weight:      Height:        Intake/Output Summary (Last 24 hours) at 08/15/2020 1407 Last data filed at 08/14/2020 2228 Gross per 24 hour  Intake --  Output 700 ml  Net -700 ml   Filed Weights   08/12/20 0415 08/13/20 0440 08/14/20 0400  Weight: 80.6 kg 73.9 kg 70.6 kg    Examination:  General exam: Appears calm and comfortable  Respiratory system: Bilateral rales. Respiratory effort normal. Cardiovascular system: S1 & S2 heard, RRR.  Gastrointestinal system: Abdomen is nondistended, soft and nontender.  Central nervous system: Alert and oriented. No focal neurological deficits. Extremities: Symmetric  Skin: No rashes Psychiatry: Judgement and insight appear normal. Mood & affect appropriate.     Data Reviewed: I have personally reviewed following labs and imaging studies  CBC: Recent Labs  Lab 08/10/20 0304 08/11/20 0730 08/12/20 0508 08/13/20 0234  WBC 15.0* 12.3* 15.8* 15.1*  NEUTROABS 13.8*  --   --  13.9*  HGB 11.2* 11.2* 10.7* 11.4*  HCT 30.5* 32.3* 31.4* 33.7*  MCV 77.2* 81.0 82.8 83.0  PLT 305 349 342 130   Basic Metabolic Panel: Recent Labs  Lab 08/12/20 0508 08/12/20 1701 08/13/20 0234 08/14/20 0244 08/15/20 0400  NA 129* 130* 130* 136 138  K 3.3* 3.5 3.8 3.8 4.3  CL 97* 93* 97* 101 99  CO2 23 26 24 26 28   GLUCOSE 106* 143* 160* 146* 143*  BUN 11 9 13 20 19   CREATININE 0.37* 0.43* 0.40* 0.49 0.42*  CALCIUM 7.6* 7.8* 7.6* 8.1* 8.2*  MG 1.9  --  2.5*  --   --    GFR: Estimated Creatinine Clearance: 72.7 mL/min (A) (by C-G formula based on SCr of 0.42 mg/dL (L)). Liver Function Tests: Recent Labs  Lab 08/10/20 0304 08/10/20 1413 08/14/20 0244 08/15/20 0400  AST 29  --  22 18  ALT 37  --  63* 50*  ALKPHOS 125  --  122 121  BILITOT 1.0  --  0.5 0.7  PROT 6.3* 6.0* 5.6* 5.6*  ALBUMIN 2.6*  --  2.4*  2.5*   Anemia Panel: Recent Labs    08/14/20 0244 08/15/20 0400  FERRITIN 333* 306   Sepsis Labs: Recent Labs  Lab 08/10/20 0304 08/10/20 0749 08/10/20 0953 08/11/20 0730 08/12/20 0508  PROCALCITON 0.31  --  0.32 0.40 0.17  LATICACIDVEN 3.5* 1.5  --   --   --     Recent Results (from the past 240 hour(s))  Blood Culture (routine x 2)     Status: None   Collection Time: 08/10/20  3:04 AM   Specimen: BLOOD  Result Value Ref Range Status   Specimen Description   Final    BLOOD BLOOD LEFT FOREARM Performed at [facility], [address] [place], [place]  [address]    Special Requests   Final    BOTTLES DRAWN AEROBIC AND ANAEROBIC Blood Culture adequate volume Performed at [place] [address], [place], [place] [address]    Culture   Final    NO GROWTH 5 DAYS Performed at [facility], [place] [address] [place], [place] [address]    Report Status 08/15/2020 FINAL  Final  Blood Culture (routine x 2)     Status: None   Collection Time: 08/10/20  3:04 AM   Specimen: BLOOD  Result Value Ref Range Status   Specimen Description   Final    BLOOD LEFT ANTECUBITAL Performed at [place] [address] [place], [place] [address]    Special Requests   Final    BOTTLES DRAWN AEROBIC AND ANAEROBIC Blood Culture adequate volume Performed at [place] [address] [place], [place] [address]    Culture   Final    NO GROWTH 5 DAYS Performed at [facility], [place] [address], [place], [place] [address]    Report Status 08/15/2020 FINAL  Final  Body fluid culture     Status: None   Collection Time: 08/10/20 12:38 PM   Specimen: Thoracentesis; Body Fluid  Result Value Ref Range Status   Specimen Description   Final    THORACENTESIS RIGHT Performed at [place] [address] [place], [place] [address]    Special Requests   Final    NONE Performed at  [facility], [place] [address], [place], [place] [address]    Gram Stain   Final    RARE WBC PRESENT, PREDOMINANTLY MONONUCLEAR NO ORGANISMS SEEN    Culture   Final    NO GROWTH Performed at [facility], [place] [address] [place], [place] [address]    Report Status 08/13/2020 FINAL  Final  MRSA PCR Screening     Status: None   Collection Time: 08/10/20  1:30 PM   Specimen: Nasopharyngeal  Result Value Ref Range Status   MRSA by PCR NEGATIVE NEGATIVE Final    Comment:        The GeneXpert MRSA Assay (FDA approved for NASAL specimens only), is one component of a comprehensive MRSA colonization surveillance program. It is not intended to diagnose MRSA infection nor to guide or monitor treatment for MRSA infections. Performed at [facility], [place] [address] [place],  [address]       Radiology Studies: DG Chest 1 View  Result Date: 08/14/2020 CLINICAL DATA:  Short of breath.  Dyspnea.  Follow-up exam. EXAM: CHEST  1 VIEW COMPARISON:  08/12/2020 and older studies. FINDINGS: Mild interval improvement. Airspace lung opacities are slightly less confluent/dense bilaterally. There are no new lung abnormalities. No pneumothorax. IMPRESSION: Mild interval improvement in bilateral airspace lung opacities since the most recent chest radiograph. No new abnormalities. Electronically Signed   By: Lajean Manes M.D.   On: 08/14/2020 06:14     Scheduled Meds: . baricitinib  4 mg Oral Daily  . Chlorhexidine Gluconate Cloth  6 each Topical Daily  . chlorpheniramine-HYDROcodone  5 mL Oral Q12H  . enoxaparin (LOVENOX) injection  70 mg Subcutaneous Q12H  . Ipratropium-Albuterol  1 puff Inhalation QID  . mouth rinse  15 mL Mouth Rinse BID  . methylPREDNISolone (SOLU-MEDROL) injection  80 mg Intravenous Q12H  . [START ON 08/16/2020] pantoprazole  40 mg Oral Daily  . sodium chloride flush  10 mL Intravenous Q12H   Continuous Infusions:   LOS: 5  days    Donnamae Jude, MD 08/15/2020 2:07 PM [phone] [facility] If 7PM-7AM, please contact night-coverage 08/15/2020, 2:07 PM

## 2020-08-15 NOTE — Progress Notes (Signed)

## 2020-08-16 DIAGNOSIS — J80 Acute respiratory distress syndrome: Secondary | ICD-10-CM | POA: Diagnosis not present

## 2020-08-16 DIAGNOSIS — J1282 Pneumonia due to coronavirus disease 2019: Secondary | ICD-10-CM | POA: Diagnosis not present

## 2020-08-16 DIAGNOSIS — U071 COVID-19: Secondary | ICD-10-CM | POA: Diagnosis not present

## 2020-08-16 LAB — COMPREHENSIVE METABOLIC PANEL
ALT: 44 U/L (ref 0–44)
AST: 15 U/L (ref 15–41)
Albumin: 2.3 g/dL — ABNORMAL LOW (ref 3.5–5.0)
Alkaline Phosphatase: 92 U/L (ref 38–126)
Anion gap: 8 (ref 5–15)
BUN: 18 mg/dL (ref 8–23)
CO2: 26 mmol/L (ref 22–32)
Calcium: 7.9 mg/dL — ABNORMAL LOW (ref 8.9–10.3)
Chloride: 102 mmol/L (ref 98–111)
Creatinine, Ser: 0.37 mg/dL — ABNORMAL LOW (ref 0.44–1.00)
GFR, Estimated: >60 mL/min (ref >60)
Glucose, Bld: 155 mg/dL — ABNORMAL HIGH (ref 70–99)
Potassium: 4.2 mmol/L (ref 3.5–5.1)
Sodium: 136 mmol/L (ref 135–145)
Total Bilirubin: 0.5 mg/dL (ref 0.3–1.2)
Total Protein: 5.1 g/dL — ABNORMAL LOW (ref 6.5–8.1)

## 2020-08-16 LAB — C-REACTIVE PROTEIN: CRP: 1.8 mg/dL — ABNORMAL HIGH (ref <1.0)

## 2020-08-16 LAB — FERRITIN: Ferritin: 310 ng/mL — ABNORMAL HIGH (ref 11–307)

## 2020-08-16 LAB — D-DIMER, QUANTITATIVE: D-Dimer, Quant: 3.07 ug/mL-FEU — ABNORMAL HIGH (ref 0.00–0.50)

## 2020-08-16 MED ORDER — FUROSEMIDE 10 MG/ML IJ SOLN
40.0000 mg | Freq: Once | INTRAMUSCULAR | Status: AC
  Administered 2020-08-16: 40 mg via INTRAVENOUS

## 2020-08-16 NOTE — Progress Notes (Signed)
NAME:  Sharon Kirk, MRN:  [id number], DOB:  December 27, 1960, LOS: 6 ADMISSION DATE:  08/10/2020, CONSULTATION DATE: 04/05/2020 REFERRING MD: Dr. Tomi Bamberger, CHIEF COMPLAINT: COVID-19 pneumonia  Brief History   59 year old presents with hyponatremia and Covid positive pneumonia with profound hypoxemia.  Diagnosed on 15th, administered bamlanivimab / etesevimab received antibiotics on 10/20, admitted 10/26.  Past Medical History  Allergies  HLD  IBS  Osteopenia  Cancer  Thyroid Nodule   Significant Hospital Events   10/26 Admit with COVID.  CTA negative for PE, LLE positive for DVT.  R Thora 638ml borderline exudate 10/27 antibiotics stopped, no evidence of bacterial process 10/29 Increasing oxygen requirements, 40L + NRB.  Started on baricitinib 11/01 Remains on 30L flow / 100% HHFNC  Consults:  PCCM  Procedures:    Significant Diagnostic Tests:   CT angiogram 10/26 >> Showing diffuse areas of patchy groundglass opacities bilateral there is consolidated lung disease in the right upper lobe, middle and right lower lobe, there is a moderate sized right pleural effusion very small left pleural effusion no central pulmonary embolus appreciated  Lower extremity Dopplers/ultrasound 10/26 >> There is no DVT in the right, however there was left posterior tibial DVT  Echocardiogram 10/26 >>  Left ventricular ejection fraction, by estimation, is 60 to 65%, NML LV fxn, The mid-free wall of the right ventricle is hypokinetic, while apical  contractility and overall excursion of the base are normal. There is also enhanced respiratory septal displacement. This may represent a forme frust of McConnell's sign (acute cor pulmonale). Right ventricular systolic function is normal.The inferior vena cava is dilated in size with <50% respiratory variability, suggesting right atrial pressure of 15 mmHg.   Right thoracentesis 10/26 >> volume 650 ml. (F)LDH: 215, (S) LDH: 476, (F) protein <3, (S) protein 6; cell  ct: clear, no orgs, neutrophil ct 66  Micro Data:  BCx2 10/26 >> negative  U. Strep 10/26 >> negative  U. Legionella 10/26 >> negative  R Pleural Fluid Culture 10/26 >> negative   Antimicrobials:  Remdesivir 10/26 >> Azith 10/26 >> 10/27 Rocephin 10/26 >> 10/27 Baricitinib 10/28 >>  Interim history/subjective:  Afebrile  On 30L / 100% flow  Afebrile  I/O 939ml UOP / -715ml in last 24 hours  Pt reports feeling better overall   Objective   Blood pressure 136/72, pulse 87, temperature (!) 97.4 F (36.3 C), temperature source Oral, resp. rate (!) 22, height 5\' 5"  (1.651 m), weight 73.6 kg, last menstrual period 06/30/1990, SpO2 93 %.    FiO2 (%):  [100 %] 100 %   Intake/Output Summary (Last 24 hours) at 08/16/2020 1353 Last data filed at 08/16/2020 0916 Gross per 24 hour  Intake 440 ml  Output 925 ml  Net -485 ml   Filed Weights   08/13/20 0440 08/14/20 0400 08/16/20 0500  Weight: 73.9 kg 70.6 kg 73.6 kg    Examination: General: adult female lying in bed in NAD watching TV HEENT: MM pink/moist, [place] O2 Neuro: AAOx4, speech clear, MAE / normal strength  CV: s1s2 RRR, no m/r/g PULM: non-labored on HHFNC 35L flow / 100%, lungs bilaterally distant but clear  GI: soft, bsx4 active  Extremities: warm/dry, trace BLE edema  Skin: no rashes or lesions   Resolved Hospital Problem list      Assessment & Plan:   Acute hypoxic respiratory failure in the setting of Covid pneumonia with ARDS; further c/b evidence of right heart failure by echo 10/26 Initially thought secondary community-acquired pneumonia,  this seems less likely suspect effusion more secondary to cor pulmonale.  CTA chest negative for PE, LE doppler positive.   -wean O2 for sats >85% -BiPAP PRN  -continue baricitinib -solumedrol 80 mg IV Q12, wean based on O2 response -prone positioning as able  -threshold for intubation > change in mental status or increased work of breathing  -follow inflammatory markers    -lasix 40 mg IV x1   Right >Left pleural effusion [place] exudative by strict light's criteria but more likely this is transudate and from Cor Pulmonale. Cultures negative  -supportive care -follow for re-accumulation   Fluid and electrolyte imbalance: hyponatremia  Sodium of 109 on admit.  She was asymptomatic on admission, responded initially to saline suggesting element of hypovolemia.Sodium actually falling again with ongoing saline raising concern for element of SIADH which we could certainly see with her pulmonary dysfunction   -follow serum sodium   Left lower extremity DVT -therapeutic lovenox     Best practice:  Diet: Regular Pain/Anxiety/Delirium protocol (if indicated): N/A VAP protocol (if indicated): N/A DVT prophylaxis: Lovenox GI prophylaxis: N/A Glucose control: Monitor Mobility: Bedrest Code Status: Full Family Communication: Per primary team Disposition: SDU / TRH.  Critical care will follow as consult.  Critical care time:    Noe Gens, MSN, NP-C, AGACNP-BC [facility] 08/16/2020, 2:03 PM   Please see [email or web address] for pager details.

## 2020-08-16 NOTE — Progress Notes (Signed)
08/16/2020 Decrease in O2 sats to upper 70s noted with face mask found off. Reapplied NRB with improvement in O2 sats to 90. Patient denies any shortness of breath. Cindy S. Brigitte Pulse BSN, RN, [facility] 08/16/2020 6:17 AM

## 2020-08-16 NOTE — Progress Notes (Addendum)
PROGRESS NOTE  Sharon Kirk MVH:[id number] DOB: May 11, 1961 DOA: 08/10/2020 PCP: Patient, No Pcp Per   LOS: 6 days   Brief narrative: As per HPI,  Sharon Kirk is a 59 y.o. female with a history of covid-19 diagnosed 10/15 s/p monoclonal antibody 10/20 who presented to the ED on 10/26 due to worsening dyspnea and was found to be profoundly hypoxemic.She was also significantly hyponatremic (Na 109). CTA chest (due to elevated d-dimer) showed no PE, bilateral (R > L) pleural effusions, and patchy diffuse infiltrates. She was admitted to [facility] service, started on remdesivir, steroids, and empiric antibiotics. Subsequent venous U/S revealed an acute left posterior tibial DVT for which heparin was started. Sodium level has improved, thoracentesis was performed, and the patient's care was transferred to the hospitalist service 08/12/20. Hypoxia  worsened with progressively worsening infiltrates noted on CXR. Heated high flow oxygen was started and PCCM remains on board.    Assessment/Plan:  Principal Problem:   Pneumonia due to COVID-19 virus Active Problems:   S/P thoracentesis   Pleural effusion, right   Hyponatremia   Acute respiratory distress syndrome (ARDS) due to COVID-19 virus [facility])   DVT (deep venous thrombosis) (HCC)   Pulmonary hypertension (HCC)  Acute hypoxemic respiratory failure due to ARDS due to covid-19 pneumonia:  COVID positive on  07/30/20, s/p bamlanivimab 10/20.  Patient has been transitioned to and treated with flow nasal cannula.  PCCM on board.  Status post remdesivir for 5 days.  Still on high-dose Solu-Medrol IV every 12H.  Continue supportive care.  Continue proning as able. Continue airborne, contact precautions for 21 days from positive testing.  Monitor inflammatory biomarkers.  Patient is a still on high flow nasal cannula at 30 L/min saturating 93%.  CT angiogram of the chest was negative for PE.  Pulmonary embolism has been ruled out.  COVID-19 Labs  Recent  Labs    08/14/20 0244 08/15/20 0400 08/16/20 0308  DDIMER 3.52* 3.39* 3.07*  FERRITIN 333* 306 310*  CRP 6.6* 3.4* 1.8*    No results found for: SARSCOV2NAA   Possible acute right-sided heart failure ( acute cor pulmonale,) transudative pleural effusions:  Status post thoracocentesis.  2 D Echocardiogram with elevated RVSP and dilated/blunted IVC.  Gram stain of the fluid was negative.  Received IV diuresis with significant response.  Negative for 3887 mL.  Continue intake and output charting daily weights.  Lasix on hold at this time.  Could consider depending upon clinical response.   Hyponatremia:  Improved.  Continue to monitor daily.  Check BMP in a.m.   Acute left posterior tibial DVT:  Continue anticoagulation.  No pulmonary embolism  Hypokalemia:  Replenished.   DVT prophylaxis: SCDs Start: 08/10/20 8413    Code Status: Full code  Family Communication: I spoke with the patient's spouse and updated her about the clinical condition of the patient.  Status is: Inpatient  Remains inpatient appropriate because:IV treatments appropriate due to intensity of illness or inability to take PO and Inpatient level of care appropriate due to severity of illness   Dispo: The patient is from: Home              Anticipated d/c is to: Home              Anticipated d/c date is: 3 days              Patient currently is not medically stable to d/c.  Consultants: PCCM  Procedures: Thoracocentesis   Antibiotics:  remdesivir IV   Anti-infectives (From admission, onward)    Start     Dose/Rate Route Frequency Ordered Stop   08/11/20 1000  remdesivir 100 mg in sodium chloride 0.9 % 100 mL IVPB       "Followed by" Linked Group Details   100 mg 200 mL/hr over 30 Minutes Intravenous Daily 08/10/20 0719 08/14/20 1104   08/10/20 0800  cefTRIAXone (ROCEPHIN) 2 g in sodium chloride 0.9 % 100 mL IVPB  Status:  Discontinued        2 g 200 mL/hr over 30 Minutes Intravenous Every 24  hours 08/10/20 0703 08/11/20 1056   08/10/20 0800  azithromycin (ZITHROMAX) 500 mg in sodium chloride 0.9 % 250 mL IVPB  Status:  Discontinued        500 mg 250 mL/hr over 60 Minutes Intravenous Every 24 hours 08/10/20 0703 08/11/20 1056   08/10/20 0730  remdesivir 200 mg in sodium chloride 0.9% 250 mL IVPB       "Followed by" Linked Group Details   200 mg 580 mL/hr over 30 Minutes Intravenous Once 08/10/20 0719 08/10/20 0953       Subjective: Today, patient was seen and examined at bedside. No chest pain, fever, chills or rigor. No nausea, vomiting or abdominal pain.   Objective: Vitals:   08/16/20 0600 08/16/20 0630  BP: 139/84   Pulse: 89 69  Resp: (!) 25 15  Temp:    SpO2: 93% 91%    Intake/Output Summary (Last 24 hours) at 08/16/2020 0727 Last data filed at 08/16/2020 0500 Gross per 24 hour  Intake 200 ml  Output 925 ml  Net -725 ml   Filed Weights   08/13/20 0440 08/14/20 0400 08/16/20 0500  Weight: 73.9 kg 70.6 kg 73.6 kg   Body mass index is 27 kg/m.   Physical Exam: GENERAL: Patient is alert awake and oriented.  On high flow nasal cannula at 30 L/min..  Does not appear to be in obvious distress. HENT: No scleral pallor or icterus. Pupils equally reactive to light. Oral mucosa is moist NECK: is supple, no gross swelling noted. CHEST: Diminished breath sounds bilaterally.  Coarse breath sounds noted. CVS: S1 and S2 heard, no murmur. Regular rate and rhythm.  ABDOMEN: Soft, non-tender, bowel sounds are present. EXTREMITIES: No edema. CNS: Cranial nerves are intact. No focal motor deficits. SKIN: warm and dry without rashes.  Data Review: I have personally reviewed the following laboratory data and studies,  CBC: Recent Labs  Lab 08/10/20 0304 08/11/20 0730 08/12/20 0508 08/13/20 0234  WBC 15.0* 12.3* 15.8* 15.1*  NEUTROABS 13.8*  --   --  13.9*  HGB 11.2* 11.2* 10.7* 11.4*  HCT 30.5* 32.3* 31.4* 33.7*  MCV 77.2* 81.0 82.8 83.0  PLT 305 349 342 365     Basic Metabolic Panel: Recent Labs  Lab 08/12/20 0508 08/12/20 0508 08/12/20 1701 08/13/20 0234 08/14/20 0244 08/15/20 0400 08/16/20 0308  NA 129*   < > 130* 130* 136 138 136  K 3.3*   < > 3.5 3.8 3.8 4.3 4.2  CL 97*   < > 93* 97* 101 99 102  CO2 23   < > 26 24 26 28 26   GLUCOSE 106*   < > 143* 160* 146* 143* 155*  BUN 11   < > 9 13 20 19 18   CREATININE 0.37*   < > 0.43* 0.40* 0.49 0.42* 0.37*  CALCIUM 7.6*   < > 7.8* 7.6* 8.1* 8.2* 7.9*  MG 1.9  --   --  2.5*  --   --   --    < > = values in this interval not displayed.   Liver Function Tests: Recent Labs  Lab 08/10/20 0304 08/10/20 1413 08/14/20 0244 08/15/20 0400 08/16/20 0308  AST 29  --  22 18 15   ALT 37  --  63* 50* 44  ALKPHOS 125  --  122 121 92  BILITOT 1.0  --  0.5 0.7 0.5  PROT 6.3* 6.0* 5.6* 5.6* 5.1*  ALBUMIN 2.6*  --  2.4* 2.5* 2.3*   No results for input(s): LIPASE, AMYLASE in the last 168 hours. No results for input(s): AMMONIA in the last 168 hours. Cardiac Enzymes: No results for input(s): CKTOTAL, CKMB, CKMBINDEX, TROPONINI in the last 168 hours. BNP (last 3 results) Recent Labs    08/10/20 0952  BNP 859.5*    ProBNP (last 3 results) No results for input(s): PROBNP in the last 8760 hours.  CBG: Recent Labs  Lab 08/15/20 1626  GLUCAP 205*   Recent Results (from the past 240 hour(s))  Blood Culture (routine x 2)     Status: None   Collection Time: 08/10/20  3:04 AM   Specimen: BLOOD  Result Value Ref Range Status   Specimen Description   Final    BLOOD BLOOD LEFT FOREARM Performed at [facility] [address] [place], [place] [address]    Special Requests   Final    BOTTLES DRAWN AEROBIC AND ANAEROBIC Blood Culture adequate volume Performed at [facility] [address], [place], [place] [address]    Culture   Final    NO GROWTH 5 DAYS Performed at [facility], [place] [address] [place], [place] [address]    Report Status  08/15/2020 FINAL  Final  Blood Culture (routine x 2)     Status: None   Collection Time: 08/10/20  3:04 AM   Specimen: BLOOD  Result Value Ref Range Status   Specimen Description   Final    BLOOD LEFT ANTECUBITAL Performed at [facility] [address] [place], [place] [address]    Special Requests   Final    BOTTLES DRAWN AEROBIC AND ANAEROBIC Blood Culture adequate volume Performed at [facility] [address], [place], [place] [address]    Culture   Final    NO GROWTH 5 DAYS Performed at [facility], [place] [address], [place], [place] [address]    Report Status 08/15/2020 FINAL  Final  Body fluid culture     Status: None   Collection Time: 08/10/20 12:38 PM   Specimen: Thoracentesis; Body Fluid  Result Value Ref Range Status   Specimen Description   Final    THORACENTESIS RIGHT Performed at [facility] [address], [place], [place] [address]    Special Requests   Final    NONE Performed at [facility], [place] [address] [place], [place] [address]    Gram Stain   Final    RARE WBC PRESENT, PREDOMINANTLY MONONUCLEAR NO ORGANISMS SEEN    Culture   Final    NO GROWTH Performed at [facility], [place] [address], [place], [place] [address]    Report Status 08/13/2020 FINAL  Final  MRSA PCR Screening     Status: None   Collection Time: 08/10/20  1:30 PM   Specimen: Nasopharyngeal  Result Value Ref Range Status   MRSA by PCR NEGATIVE NEGATIVE  Final    Comment:        The GeneXpert MRSA Assay (FDA approved for NASAL specimens only), is one component of a comprehensive MRSA colonization surveillance program. It is not intended to diagnose MRSA infection nor to guide or monitor treatment for MRSA infections. Performed at [facility], [place] [address] [place], [place] [address]      Studies: No results found.    Flora Lipps, MD  [facility] 08/16/2020

## 2020-08-17 ENCOUNTER — Inpatient Hospital Stay (HOSPITAL_COMMUNITY): Payer: BC Managed Care – PPO

## 2020-08-17 LAB — COMPREHENSIVE METABOLIC PANEL
ALT: 44 U/L (ref 0–44)
AST: 16 U/L (ref 15–41)
Albumin: 2.2 g/dL — ABNORMAL LOW (ref 3.5–5.0)
Alkaline Phosphatase: 91 U/L (ref 38–126)
Anion gap: 7 (ref 5–15)
BUN: 19 mg/dL (ref 8–23)
CO2: 28 mmol/L (ref 22–32)
Calcium: 7.9 mg/dL — ABNORMAL LOW (ref 8.9–10.3)
Chloride: 101 mmol/L (ref 98–111)
Creatinine, Ser: 0.43 mg/dL — ABNORMAL LOW (ref 0.44–1.00)
GFR, Estimated: >60 mL/min (ref >60)
Glucose, Bld: 156 mg/dL — ABNORMAL HIGH (ref 70–99)
Potassium: 4.8 mmol/L (ref 3.5–5.1)
Sodium: 136 mmol/L (ref 135–145)
Total Bilirubin: 0.5 mg/dL (ref 0.3–1.2)
Total Protein: 4.9 g/dL — ABNORMAL LOW (ref 6.5–8.1)

## 2020-08-17 LAB — D-DIMER, QUANTITATIVE: D-Dimer, Quant: 2.6 ug/mL-FEU — ABNORMAL HIGH (ref 0.00–0.50)

## 2020-08-17 LAB — FERRITIN: Ferritin: 308 ng/mL — ABNORMAL HIGH (ref 11–307)

## 2020-08-17 LAB — C-REACTIVE PROTEIN: CRP: 0.9 mg/dL (ref <1.0)

## 2020-08-17 IMAGING — DX DG CHEST 1V PORT
1 series · 1 of 1 positions shown · non-contrast
Comparison: [DATE].

CLINICAL DATA: Respiratory distress.

EXAM:
PORTABLE CHEST 1 VIEW

[chest ap]
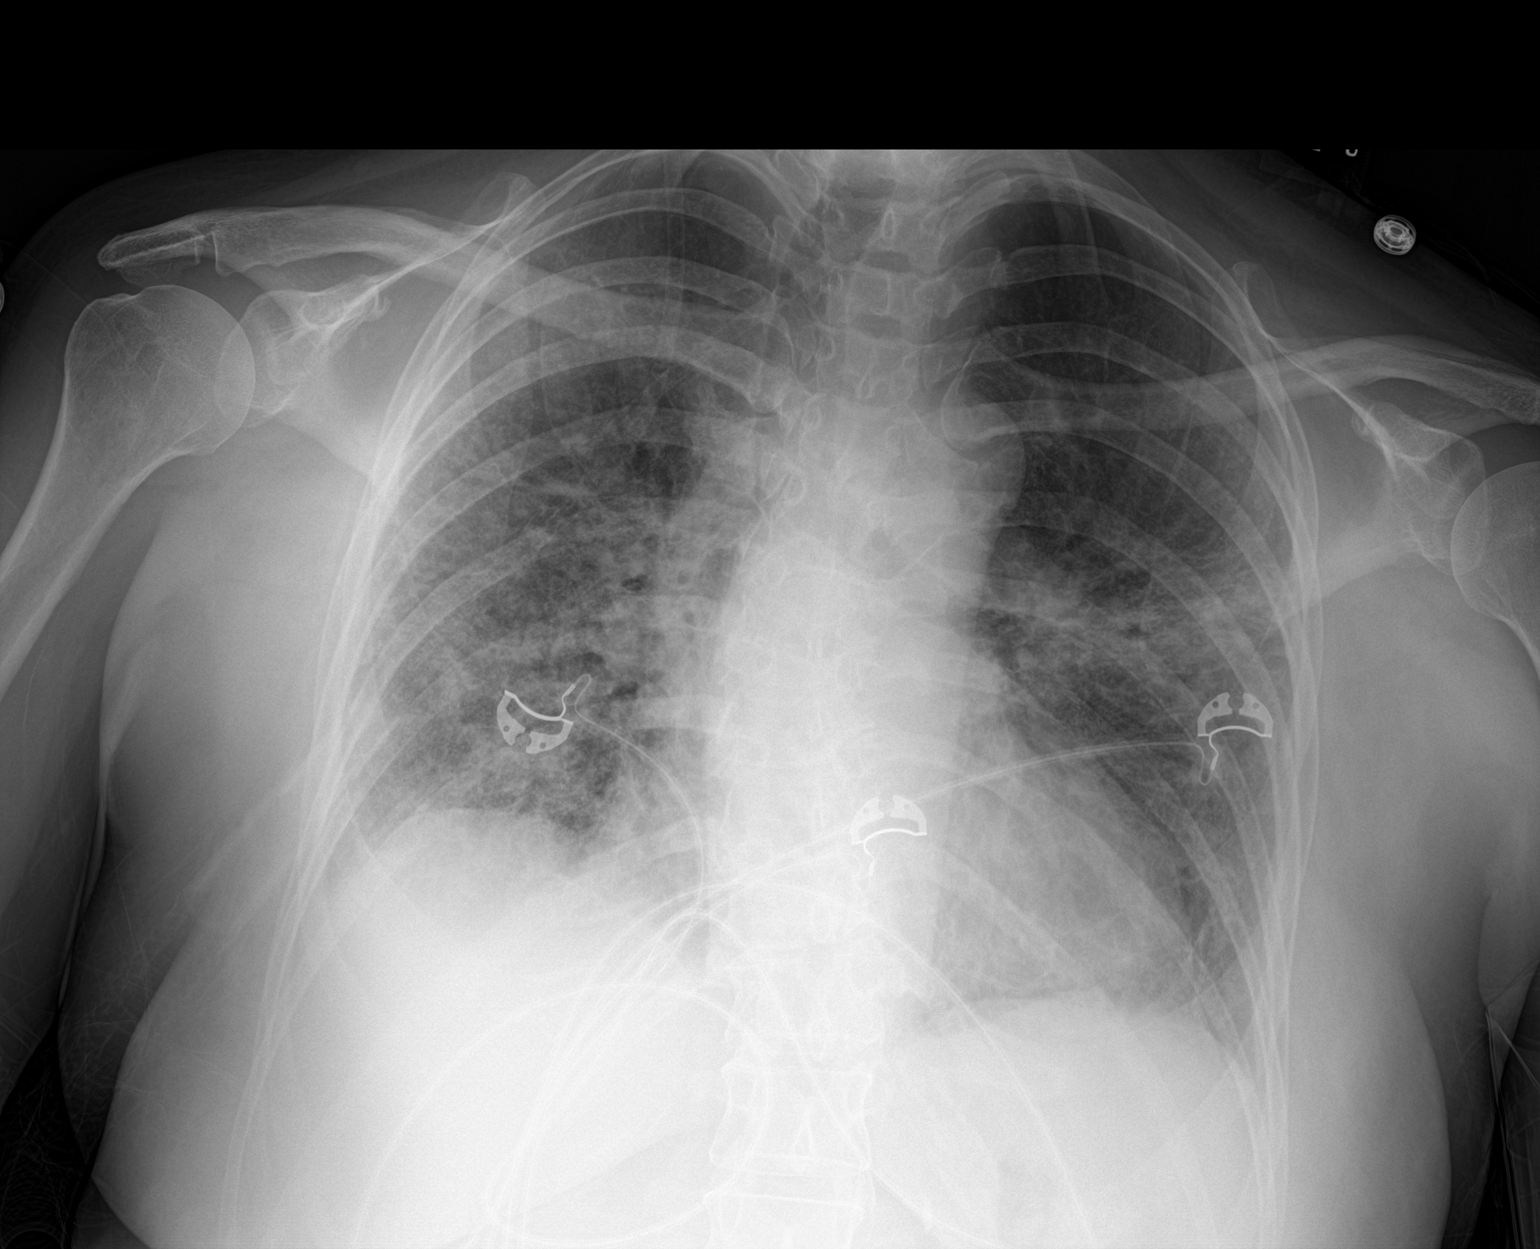

[1 of 1 positions shown; findings below may reference images not displayed]

FINDINGS: Heart size stable. Multifocal bilateral pulmonary infiltrates, right
side greater than left again noted without interim change. Low lung
volumes. No prominent pleural effusion. No pneumothorax.
IMPRESSION: Multifocal bilateral pulmonary infiltrates, right side greater than
left again noted without interim change. Low lung volumes.

## 2020-08-17 NOTE — TOC Progression Note (Signed)
Transition of Care [facility]) - Progression Note    Patient Details  Name: Sharon Kirk MRN: [id number] Date of Birth: [birth year]  Transition of Care [facility] CM/SW Contact  Leeroy Cha, RN Phone Number: 08/17/2020, 7:49 AM  Clinical Narrative:    58 y.o.femalewith a history of covid-19 diagnosed 10/15 s/p monoclonal antibody 10/20 who presented to the ED on 10/26 due to worsening dyspnea and was found to be profoundly hypoxemic.She was also significantly hyponatremic (Na 109). CTA chest (due to elevated d-dimer) showed no PE, bilateral (R >L) pleural effusions, and patchy diffuse infiltrates. She was admitted to [facility] service, started on remdesivir, steroids, and empiric antibiotics. Subsequent venous U/S revealed an acute left posterior tibial DVT for which heparin was started. Sodium level has improved, thoracentesis was performed, and the patient's care was transferred to the hospitalist service 08/12/20. Hypoxia  worsened with progressively worsening infiltrates noted on CXR. Heated high flow oxygen was started and PCCM remains on board. hfnc at 35l/min, iv solu medrol. Plan is to return to home following for rpogression= pt was transferred to med surg floor on [id number] and transferred back to  sdu on [id number]-was to be transferred to [facility] but does wish to go .  Expected Discharge Plan: Home/Self Care Barriers to Discharge: Barriers Unresolved (comment) (covid)  Expected Discharge Plan and Services Expected Discharge Plan: Home/Self Care   Discharge Planning Services: CM Consult   Living arrangements for the past 2 months: Single Family Home                                       Social Determinants of Health (SDOH) Interventions    Readmission Risk Interventions No flowsheet data found.

## 2020-08-17 NOTE — Progress Notes (Signed)
PROGRESS NOTE  Sharon Kirk [id number]:[id number] DOB: 14-May-1961 DOA: 08/10/2020 PCP: Patient, No Pcp Per   LOS: 7 days   Brief narrative: As per HPI,  Sharon Kirk is a 59 y.o. female with a history of covid-19 diagnosed 07/30/20 s/p monoclonal antibody 10/20 who presented to the ED on 10/26 due to worsening dyspnea and was found to be profoundly hypoxemic.She was also significantly hyponatremic (Na 109). CTA chest (due to elevated d-dimer) showed no PE, bilateral (R > L) pleural effusions, and patchy diffuse infiltrates. She was admitted to [facility] service, started on remdesivir, steroids, and empiric antibiotics. Subsequent venous U/S revealed an acute left posterior tibial DVT for which heparin was started. Sodium level has improved, thoracentesis was performed, and the patient's care was transferred to the hospitalist service 08/12/20. Hypoxia  worsened with progressively worsening infiltrates noted on CXR. Heated high flow oxygen was started.  PCCM remains on board.    Assessment/Plan:  Principal Problem:   Pneumonia due to COVID-19 virus Active Problems:   S/P thoracentesis   Pleural effusion, right   Hyponatremia   ARDS (adult respiratory distress syndrome) (HCC)   DVT (deep venous thrombosis) (HCC)   Pulmonary hypertension (HCC)  Acute hypoxemic respiratory failure due to ARDS due to covid-19 pneumonia:  COVID positive on  07/30/20, s/p bamlanivimab 08/04/20.  On high flow nasal cannula.  PCCM on board.  Status post remdesivir for 5 days.  on high-dose Solu-Medrol IV every 12H, baracitinib.  Continue supportive care.  Continue proning as able. Continue airborne, contact precautions for 21 days from positive testing.  Monitor inflammatory biomarkers, trending down.  On HFNC at 35 L/min saturating 92%.  CT angiogram of the chest was negative for PE.  Pulmonary embolism has been ruled out.  COVID-19 Labs   Recent Labs    08/15/20 0400 08/16/20 0308 08/17/20 0311  DDIMER 3.39* 3.07*  2.60*  FERRITIN 306 310* 308*  CRP 3.4* 1.8* 0.9    No results found for: SARSCOV2NAA   Possible acute right-sided heart failure ( acute cor pulmonale,) transudative pleural effusions:  Status post thoracocentesis.  2 D Echocardiogram with elevated RVSP and dilated/blunted IVC.  Gram stain of the fluid was negative.  Received IV diuresis with significant response.  Negative for 3627 mL.  Continue intake and output charting, daily weights.  Lasix on hold at this time.  Could consider depending upon clinical response.   Hyponatremia:  Resolved. Continue to monitor daily.  Check BMP in a.m.   Acute left posterior tibial DVT:  Continue anticoagulation with lovenox.   Hypokalemia:  Replenished.   DVT prophylaxis: SCDs Start: 08/10/20 0651, lovenox subq   Code Status: Full code  Family Communication:   I again spoke with the patient's spouse [name] on the phone and updated him about the clinical condition of the patient.  Status is: Inpatient  Remains inpatient appropriate because:IV treatments appropriate due to intensity of illness or inability to take PO and Inpatient level of care appropriate due to severity of illness, high oxygen demand   Dispo: The patient is from: Home              Anticipated d/c is to: Home              Anticipated d/c date is: 3 days or more              Patient currently is not medically stable to d/c.  Consultants:  PCCM  Procedures:  Thoracocentesis   Antibiotics:  .  remdesivir IV   Anti-infectives (From admission, onward)   Start     Dose/Rate Route Frequency Ordered Stop   08/11/20 1000  remdesivir 100 mg in sodium chloride 0.9 % 100 mL IVPB       "Followed by" Linked Group Details   100 mg 200 mL/hr over 30 Minutes Intravenous Daily 08/10/20 0719 08/14/20 1104   08/10/20 0800  cefTRIAXone (ROCEPHIN) 2 g in sodium chloride 0.9 % 100 mL IVPB  Status:  Discontinued        2 g 200 mL/hr over 30 Minutes Intravenous Every 24 hours  08/10/20 0703 08/11/20 1056   08/10/20 0800  azithromycin (ZITHROMAX) 500 mg in sodium chloride 0.9 % 250 mL IVPB  Status:  Discontinued        500 mg 250 mL/hr over 60 Minutes Intravenous Every 24 hours 08/10/20 0703 08/11/20 1056   08/10/20 0730  remdesivir 200 mg in sodium chloride 0.9% 250 mL IVPB       "Followed by" Linked Group Details   200 mg 580 mL/hr over 30 Minutes Intravenous Once 08/10/20 0719 08/10/20 0953      Subjective: Today, patient was seen and examined at bedside.  Patient denies any increasing dyspnea, chest pain, fever, chills or rigor.  Denies any nausea vomiting or diarrhea.  Objective: Vitals:   08/17/20 0400 08/17/20 0700  BP: (!) 145/77 125/87  Pulse: 88 70  Resp: (!) 23 (!) 21  Temp: 97.8 F (36.6 C)   SpO2: 93% 92%    Intake/Output Summary (Last 24 hours) at 08/17/2020 0728 Last data filed at 08/17/2020 0000 Gross per 24 hour  Intake 1200 ml  Output 700 ml  Net 500 ml   Filed Weights   08/14/20 0400 08/16/20 0500 08/17/20 0500  Weight: 70.6 kg 73.6 kg 74.3 kg   Body mass index is 27.26 kg/m.   Physical Exam:  GENERAL: Patient is alert awake and oriented.  On high flow nasal cannula at 35 L/min. Not in obvious distress. HENT: No scleral pallor or icterus. Pupils equally reactive to light. Oral mucosa is moist NECK: is supple, no gross swelling noted. CHEST: Diminished breath sounds bilaterally.  Coarse breath sounds noted. CVS: S1 and S2 heard, no murmur. Regular rate and rhythm.  ABDOMEN: Soft, non-tender, bowel sounds are present. EXTREMITIES: No edema. CNS: Cranial nerves are intact. No focal motor deficits. SKIN: warm and dry without rashes.  Data Review: I have personally reviewed the following laboratory data and studies,  CBC: Recent Labs  Lab 08/11/20 0730 08/12/20 0508 08/13/20 0234  WBC 12.3* 15.8* 15.1*  NEUTROABS  --   --  13.9*  HGB 11.2* 10.7* 11.4*  HCT 32.3* 31.4* 33.7*  MCV 81.0 82.8 83.0  PLT 349 342 737    Basic Metabolic Panel: Recent Labs  Lab 08/12/20 0508 08/12/20 1701 08/13/20 0234 08/14/20 0244 08/15/20 0400 08/16/20 0308 08/17/20 0311  NA 129*   < > 130* 136 138 136 136  K 3.3*   < > 3.8 3.8 4.3 4.2 4.8  CL 97*   < > 97* 101 99 102 101  CO2 23   < > 24 26 28 26 28   GLUCOSE 106*   < > 160* 146* 143* 155* 156*  BUN 11   < > 13 20 19 18 19   CREATININE 0.37*   < > 0.40* 0.49 0.42* 0.37* 0.43*  CALCIUM 7.6*   < > 7.6* 8.1* 8.2* 7.9* 7.9*  MG 1.9  --  2.5*  --   --   --   --    < > =  values in this interval not displayed.   Liver Function Tests: Recent Labs  Lab 08/10/20 1413 08/14/20 0244 08/15/20 0400 08/16/20 0308 08/17/20 0311  AST  --  22 18 15 16   ALT  --  63* 50* 44 44  ALKPHOS  --  122 121 92 91  BILITOT  --  0.5 0.7 0.5 0.5  PROT 6.0* 5.6* 5.6* 5.1* 4.9*  ALBUMIN  --  2.4* 2.5* 2.3* 2.2*   No results for input(s): LIPASE, AMYLASE in the last 168 hours. No results for input(s): AMMONIA in the last 168 hours. Cardiac Enzymes: No results for input(s): CKTOTAL, CKMB, CKMBINDEX, TROPONINI in the last 168 hours. BNP (last 3 results) Recent Labs    08/10/20 0952  BNP 859.5*    ProBNP (last 3 results) No results for input(s): PROBNP in the last 8760 hours.  CBG: Recent Labs  Lab 08/15/20 1626  GLUCAP 205*   Recent Results (from the past 240 hour(s))  Blood Culture (routine x 2)     Status: None   Collection Time: 08/10/20  3:04 AM   Specimen: BLOOD  Result Value Ref Range Status   Specimen Description   Final    BLOOD BLOOD LEFT FOREARM Performed at [facility] [address] [place], [place] [address]    Special Requests   Final    BOTTLES DRAWN AEROBIC AND ANAEROBIC Blood Culture adequate volume Performed at [facility] [address] [place], [place] [address]    Culture   Final    NO GROWTH 5 DAYS Performed at [facility], [place] [address] [place], [place] [address]    Report Status  08/15/2020 FINAL  Final  Blood Culture (routine x 2)     Status: None   Collection Time: 08/10/20  3:04 AM   Specimen: BLOOD  Result Value Ref Range Status   Specimen Description   Final    BLOOD LEFT ANTECUBITAL Performed at [facility] [address], [place], [place] [address]    Special Requests   Final    BOTTLES DRAWN AEROBIC AND ANAEROBIC Blood Culture adequate volume Performed at [facility] [address] [place], [place] [address]    Culture   Final    NO GROWTH 5 DAYS Performed at [facility], [place] [address], [place], [place] [address]    Report Status 08/15/2020 FINAL  Final  Body fluid culture     Status: None   Collection Time: 08/10/20 12:38 PM   Specimen: Thoracentesis; Body Fluid  Result Value Ref Range Status   Specimen Description   Final    THORACENTESIS RIGHT Performed at [facility] [address], [place], [place] [address]    Special Requests   Final    NONE Performed at [facility], [place] [address] [place], [place] [address]    Gram Stain   Final    RARE WBC PRESENT, PREDOMINANTLY MONONUCLEAR NO ORGANISMS SEEN    Culture   Final    NO GROWTH Performed at [facility], [place] [address], [place], [place] [address]    Report Status 08/13/2020 FINAL  Final  MRSA PCR Screening     Status: None   Collection Time: 08/10/20  1:30 PM   Specimen: Nasopharyngeal  Result Value Ref Range Status   MRSA by PCR NEGATIVE NEGATIVE Final    Comment:        The GeneXpert MRSA  Assay (FDA approved for NASAL specimens only), is one component of a comprehensive MRSA colonization surveillance program. It is not intended to diagnose MRSA infection nor to guide or monitor treatment for MRSA infections. Performed at [facility], [place] [address] [place], [place] [address]      Studies: DG Chest Port 1 View  Result Date: 08/17/2020 CLINICAL DATA:   Respiratory distress. EXAM: PORTABLE CHEST 1 VIEW COMPARISON:  08/14/2020. FINDINGS: Heart size stable. Multifocal bilateral pulmonary infiltrates, right side greater than left again noted without interim change. Low lung volumes. No prominent pleural effusion. No pneumothorax. IMPRESSION: Multifocal bilateral pulmonary infiltrates, right side greater than left again noted without interim change. Low lung volumes. Electronically Signed   By: Marcello Moores  Register   On: 08/17/2020 06:06      Flora Lipps, MD  [facility] 08/17/2020

## 2020-08-17 NOTE — Progress Notes (Signed)
Chart review and discussion with staff. Mrs. Sharon Kirk is stable on O2 needs (35L flow, 100% HHFNC), no acute distress / talking in full sentences watching TV able to eat meals.  Continue current plan of care.  PCCM will follow up in am 11/3.    Noe Gens, MSN, NP-C, AGACNP-BC [facility] 08/17/2020, 11:17 AM   Please see [email or web address] for pager details.

## 2020-08-18 DIAGNOSIS — I82442 Acute embolism and thrombosis of left tibial vein: Secondary | ICD-10-CM

## 2020-08-18 DIAGNOSIS — J80 Acute respiratory distress syndrome: Secondary | ICD-10-CM | POA: Diagnosis not present

## 2020-08-18 DIAGNOSIS — U071 COVID-19: Secondary | ICD-10-CM | POA: Diagnosis not present

## 2020-08-18 DIAGNOSIS — J1282 Pneumonia due to coronavirus disease 2019: Secondary | ICD-10-CM | POA: Diagnosis not present

## 2020-08-18 LAB — COMPREHENSIVE METABOLIC PANEL
ALT: 45 U/L — ABNORMAL HIGH (ref 0–44)
AST: 17 U/L (ref 15–41)
Albumin: 2.3 g/dL — ABNORMAL LOW (ref 3.5–5.0)
Alkaline Phosphatase: 79 U/L (ref 38–126)
Anion gap: 6 (ref 5–15)
BUN: 16 mg/dL (ref 8–23)
CO2: 28 mmol/L (ref 22–32)
Calcium: 7.9 mg/dL — ABNORMAL LOW (ref 8.9–10.3)
Chloride: 102 mmol/L (ref 98–111)
Creatinine, Ser: 0.42 mg/dL — ABNORMAL LOW (ref 0.44–1.00)
GFR, Estimated: >60 mL/min (ref >60)
Glucose, Bld: 149 mg/dL — ABNORMAL HIGH (ref 70–99)
Potassium: 5.3 mmol/L — ABNORMAL HIGH (ref 3.5–5.1)
Sodium: 136 mmol/L (ref 135–145)
Total Bilirubin: 0.6 mg/dL (ref 0.3–1.2)
Total Protein: 4.8 g/dL — ABNORMAL LOW (ref 6.5–8.1)

## 2020-08-18 LAB — C-REACTIVE PROTEIN: CRP: 0.7 mg/dL (ref <1.0)

## 2020-08-18 LAB — CBC
HCT: 38.9 % (ref 36.0–46.0)
Hemoglobin: 12.6 g/dL (ref 12.0–15.0)
MCH: 28.8 pg (ref 26.0–34.0)
MCHC: 32.4 g/dL (ref 30.0–36.0)
MCV: 89 fL (ref 80.0–100.0)
Platelets: 398 10*3/uL (ref 150–400)
RBC: 4.37 MIL/uL (ref 3.87–5.11)
RDW: 15.9 % — ABNORMAL HIGH (ref 11.5–15.5)
WBC: 14.4 10*3/uL — ABNORMAL HIGH (ref 4.0–10.5)
nRBC: 0 % (ref 0.0–0.2)

## 2020-08-18 LAB — D-DIMER, QUANTITATIVE: D-Dimer, Quant: 2.31 ug/mL-FEU — ABNORMAL HIGH (ref 0.00–0.50)

## 2020-08-18 LAB — FERRITIN: Ferritin: 357 ng/mL — ABNORMAL HIGH (ref 11–307)

## 2020-08-18 MED ORDER — METHYLPREDNISOLONE SODIUM SUCC 40 MG IJ SOLR
40.0000 mg | Freq: Two times a day (BID) | INTRAMUSCULAR | Status: DC
  Administered 2020-08-18 – 2020-08-24 (×13): 40 mg via INTRAVENOUS
  Filled 2020-08-18 (×12): qty 1

## 2020-08-18 NOTE — Progress Notes (Signed)
PROGRESS NOTE  Sharon Kirk TLX:[id number] DOB: 09-10-1961 DOA: 08/10/2020 PCP: Patient, No Pcp Per   LOS: 8 days   Brief narrative: As per HPI,  Sharon Kirk is a 59 y.o. female with a history of covid-19 diagnosed 07/30/20 s/p monoclonal antibody 10/20 who presented to the ED on 10/26 due to worsening dyspnea and was found to be profoundly hypoxemic.She was also significantly hyponatremic with sodium of  109. CTA chest (due to elevated d-dimer) showed no PE, bilateral (R > L) pleural effusions, and patchy diffuse infiltrates. She was admitted to the [facility] service, started on remdesivir, steroids, and empiric antibiotics. Subsequent venous U/S revealed an acute left posterior tibial DVT for which heparin was started. Sodium level has improved, thoracentesis was performed, and the patient's care was transferred to the hospitalist service 08/12/20. Hypoxia  worsened with progressively worsening infiltrates noted on CXR. Heated high flow oxygen was started.  PCCM remains on board.    Assessment/Plan:  Principal Problem:   Pneumonia due to COVID-19 virus Active Problems:   S/P thoracentesis   Pleural effusion, right   Hyponatremia   ARDS (adult respiratory distress syndrome) (HCC)   DVT (deep venous thrombosis) (HCC)   Pulmonary hypertension (HCC)  Acute hypoxemic respiratory failure due to ARDS due to covid-19 pneumonia:  Patient was positive for Covid on  07/30/20, s/p bamlanivimab 08/04/20.  Currently on high flow nasal cannula.  Pulmonary following.  Status post remdesivir for 5 days.  on high-dose Solu-Medrol IV every 12H, baracitinib.  Continue supportive care.  Continue proning as able. Continue airborne, contact precautions for 21 days from positive testing.  Monitor inflammatory biomarkers, trending down.  Still on HFNC at 35 L/min saturating 92%.  CT angiogram of the chest was negative for pulmonary embolism.  COVID-19 Labs   Recent Labs    08/16/20 0308 08/17/20 0311  08/18/20 0313  DDIMER 3.07* 2.60* 2.31*  FERRITIN 310* 308* 357*  CRP 1.8* 0.9 0.7    No results found for: SARSCOV2NAA   Possible acute right-sided heart failure ( acute cor pulmonale,) transudative pleural effusions:  Status post thoracocentesis.  2 D Echocardiogram with elevated RVSP and dilated/blunted IVC.  Gram stain of the fluid was negative.  Received IV diuresis with significant response.  Negative for 4497 mL.  Continue intake and output charting, daily weights.  Lasix on hold at this time.  Could consider depending upon clinical response.   Hyponatremia:  Resolved. Continue to monitor daily.  Check BMP in a.m. sodium of 136 today.   Acute left posterior tibial DVT:  Continue anticoagulation with lovenox.  Continue Lovenox anticoagulation.  Hypokalemia:  Replenished.  Potassium of 5.3 today.  Will closely monitor.  On steroids.  Not on potassium supplements.  DVT prophylaxis: SCDs Start: 08/10/20 0651, lovenox subq  Code Status: Full code  Family Communication: I  again spoke with the patient's spouse Sharon Kirk on the phone and updated him about the clinical condition of the patient yesterday.  Status is: Inpatient  Remains inpatient appropriate because:IV treatments appropriate due to intensity of illness or inability to take PO and Inpatient level of care appropriate due to severity of illness, high oxygen demand   Dispo: The patient is from: Home              Anticipated d/c is to: Home              Anticipated d/c date is: 3 days or more  Patient currently is not medically stable to d/c.  Consultants:  PCCM  Procedures:  Thoracocentesis   Antibiotics:  . remdesivir IV 10/26>10/30  Anti-infectives (From admission, onward)   Start     Dose/Rate Route Frequency Ordered Stop   08/11/20 1000  remdesivir 100 mg in sodium chloride 0.9 % 100 mL IVPB       "Followed by" Linked Group Details   100 mg 200 mL/hr over 30 Minutes Intravenous Daily  08/10/20 0719 08/14/20 1104   08/10/20 0800  cefTRIAXone (ROCEPHIN) 2 g in sodium chloride 0.9 % 100 mL IVPB  Status:  Discontinued        2 g 200 mL/hr over 30 Minutes Intravenous Every 24 hours 08/10/20 0703 08/11/20 1056   08/10/20 0800  azithromycin (ZITHROMAX) 500 mg in sodium chloride 0.9 % 250 mL IVPB  Status:  Discontinued        500 mg 250 mL/hr over 60 Minutes Intravenous Every 24 hours 08/10/20 0703 08/11/20 1056   08/10/20 0730  remdesivir 200 mg in sodium chloride 0.9% 250 mL IVPB       "Followed by" Linked Group Details   200 mg 580 mL/hr over 30 Minutes Intravenous Once 08/10/20 0719 08/10/20 0953      Subjective: Today, patient was seen and examined at bedside.  Patient denies any chest pain, shortness of breath, fever or chills.  Denies nausea vomiting  Objective: Vitals:   08/18/20 0600 08/18/20 0700  BP: 118/80 131/80  Pulse: 72 65  Resp: (!) 23 15  Temp:    SpO2: (!) 86% 90%    Intake/Output Summary (Last 24 hours) at 08/18/2020 0808 Last data filed at 08/18/2020 0600 Gross per 24 hour  Intake 480 ml  Output 1350 ml  Net -870 ml   Filed Weights   08/16/20 0500 08/17/20 0500 08/18/20 0500  Weight: 73.6 kg 74.3 kg 74.3 kg   Body mass index is 27.26 kg/m.   Physical Exam:  General: Patient is alert awake oriented, on high flow nasal cannula at 35 L/min, not in obvious distress  HENT:   No scleral pallor or icterus noted. Oral mucosa is moist.  Chest: Coarse breath sounds noted.  Crackles of the bases. CVS: S1 &S2 heard. No murmur.  Regular rate and rhythm. Abdomen: Soft, nontender, nondistended.  Bowel sounds are heard.   Extremities: No cyanosis, clubbing or edema.  Peripheral pulses are palpable. Psych: Alert, awake and oriented, normal mood CNS:  No cranial nerve deficits.  Power equal in all extremities.   Skin: Warm and dry.  No rashes noted.   Data Review: I have personally reviewed the following laboratory data and studies,  CBC: Recent  Labs  Lab 09-11-2020 0508 08/13/20 0234 08/18/20 0313  WBC 15.8* 15.1* 14.4*  NEUTROABS  --  13.9*  --   HGB 10.7* 11.4* 12.6  HCT 31.4* 33.7* 38.9  MCV 82.8 83.0 89.0  PLT 342 365 299   Basic Metabolic Panel: Recent Labs  Lab 11-Sep-2020 0508 Sep 11, 2020 1701 08/13/20 0234 08/13/20 0234 08/14/20 0244 08/15/20 0400 08/16/20 0308 08/17/20 0311 08/18/20 0313  NA 129*   < > 130*   < > 136 138 136 136 136  K 3.3*   < > 3.8   < > 3.8 4.3 4.2 4.8 5.3*  CL 97*   < > 97*   < > 101 99 102 101 102  CO2 23   < > 24   < > 26 28 26 28  28  GLUCOSE 106*   < > 160*   < > 146* 143* 155* 156* 149*  BUN 11   < > 13   < > 20 19 18 19 16   CREATININE 0.37*   < > 0.40*   < > 0.49 0.42* 0.37* 0.43* 0.42*  CALCIUM 7.6*   < > 7.6*   < > 8.1* 8.2* 7.9* 7.9* 7.9*  MG 1.9  --  2.5*  --   --   --   --   --   --    < > = values in this interval not displayed.   Liver Function Tests: Recent Labs  Lab 08/14/20 0244 08/15/20 0400 08/16/20 0308 08/17/20 0311 08/18/20 0313  AST 22 18 15 16 17   ALT 63* 50* 44 44 45*  ALKPHOS 122 121 92 91 79  BILITOT 0.5 0.7 0.5 0.5 0.6  PROT 5.6* 5.6* 5.1* 4.9* 4.8*  ALBUMIN 2.4* 2.5* 2.3* 2.2* 2.3*   No results for input(s): LIPASE, AMYLASE in the last 168 hours. No results for input(s): AMMONIA in the last 168 hours. Cardiac Enzymes: No results for input(s): CKTOTAL, CKMB, CKMBINDEX, TROPONINI in the last 168 hours. BNP (last 3 results) Recent Labs    08/10/20 0952  BNP 859.5*    ProBNP (last 3 results) No results for input(s): PROBNP in the last 8760 hours.  CBG: Recent Labs  Lab 08/15/20 1626  GLUCAP 205*   Recent Results (from the past 240 hour(s))  Blood Culture (routine x 2)     Status: None   Collection Time: 08/10/20  3:04 AM   Specimen: BLOOD  Result Value Ref Range Status   Specimen Description   Final    BLOOD BLOOD LEFT FOREARM Performed at [facility] [address] [place], [place] [address]    Special Requests    Final    BOTTLES DRAWN AEROBIC AND ANAEROBIC Blood Culture adequate volume Performed at [facility] [address], [place], [place] [address]    Culture   Final    NO GROWTH 5 DAYS Performed at [facility], [place] [address] [place], [place] [address]    Report Status 08/15/2020 FINAL  Final  Blood Culture (routine x 2)     Status: None   Collection Time: 08/10/20  3:04 AM   Specimen: BLOOD  Result Value Ref Range Status   Specimen Description   Final    BLOOD LEFT ANTECUBITAL Performed at [facility] [address], [place], [place] [address]    Special Requests   Final    BOTTLES DRAWN AEROBIC AND ANAEROBIC Blood Culture adequate volume Performed at [facility] [address], [place], [place] [address]    Culture   Final    NO GROWTH 5 DAYS Performed at [facility], [place] [address], [place], [place] [address]    Report Status 08/15/2020 FINAL  Final  Body fluid culture     Status: None   Collection Time: 08/10/20 12:38 PM   Specimen: Thoracentesis; Body Fluid  Result Value Ref Range Status   Specimen Description   Final    THORACENTESIS RIGHT Performed at [facility] [address], [place], [place] [address]    Special Requests   Final    NONE Performed at [facility], [place] [address] [place], [place] [address]    Gram Stain   Final    RARE WBC PRESENT, PREDOMINANTLY MONONUCLEAR  NO ORGANISMS SEEN    Culture   Final    NO GROWTH Performed at [facility], [place] [address], [place], [place] [address]    Report Status 08/13/2020 FINAL  Final  MRSA PCR Screening     Status: None   Collection Time: 08/10/20  1:30 PM   Specimen: Nasopharyngeal  Result Value Ref Range Status   MRSA by PCR NEGATIVE NEGATIVE Final    Comment:        The GeneXpert MRSA Assay (FDA approved for NASAL specimens only), is one component of a comprehensive MRSA  colonization surveillance program. It is not intended to diagnose MRSA infection nor to guide or monitor treatment for MRSA infections. Performed at [facility], [place] [address], [place],  [address]      Studies: DG Chest Port 1 View  Result Date: 08/17/2020 CLINICAL DATA:  Respiratory distress. EXAM: PORTABLE CHEST 1 VIEW COMPARISON:  08/14/2020. FINDINGS: Heart size stable. Multifocal bilateral pulmonary infiltrates, right side greater than left again noted without interim change. Low lung volumes. No prominent pleural effusion. No pneumothorax. IMPRESSION: Multifocal bilateral pulmonary infiltrates, right side greater than left again noted without interim change. Low lung volumes. Electronically Signed   By: Marcello Moores  Register   On: 08/17/2020 06:06      Flora Lipps, MD  [facility] 08/18/2020

## 2020-08-18 NOTE — Progress Notes (Signed)
NAME:  Sharon Kirk, MRN:  [id number], DOB:  1961-02-10, LOS: 8 ADMISSION DATE:  08/10/2020, CONSULTATION DATE:  04/05/20 REFERRING MD:  Tomi Bamberger, CHIEF COMPLAINT:  Dyspnea  Brief History   59 year old presents with hyponatremia and Covid positive pneumonia with profound hypoxemia.  Diagnosed on 15th, administered bamlanivimab / etesevimab received antibiotics on 10/20, admitted 10/26.  Past Medical History  Allergies  HLD  IBS  Osteopenia  Cancer  Thyroid Nodule   Significant Hospital Events   10/26 Admit with COVID.  CTA negative for PE, LLE positive for DVT.  R Thora 647ml borderline exudate 10/27 antibiotics stopped, no evidence of bacterial process 10/29 Increasing oxygen requirements, 40L + NRB.  Started on baricitinib 11/01 Remains on 30L flow / 100% HHFNC  Consults:  PCCM  Procedures:  none  Significant Diagnostic Tests:   CT angiogram 10/26 >> Showing diffuse areas of patchy groundglass opacities bilateral there is consolidated lung disease in the right upper lobe, middle and right lower lobe, there is a moderate sized right pleural effusion very small left pleural effusion no central pulmonary embolus appreciated  Lower extremity Dopplers/ultrasound 10/26 >> There is no DVT in the right, however there was left posterior tibial DVT  Echocardiogram 10/26 >>  Left ventricular ejection fraction, by estimation, is 60 to 65%, NML LV fxn, The mid-free wall of the right ventricle is hypokinetic, while apical  contractility and overall excursion of the base are normal. There is also enhanced respiratory septal displacement. This may represent a forme frust of McConnell's sign (acute cor pulmonale). Right ventricular systolic function is normal.The inferior vena cava is dilated in size with <50% respiratory variability, suggesting right atrial pressure of 15 mmHg.   Right thoracentesis 10/26 >> volume 650 ml. (F)LDH: 215, (S) LDH: 476, (F) protein <3, (S) protein 6; cell ct: clear,  no orgs, neutrophil ct 66  Micro Data:  BCx2 10/26 >> negative  U. Strep 10/26 >> negative  U. Legionella 10/26 >> negative  R Pleural Fluid Culture 10/26 >> negative   Antimicrobials:  Remdesivir 10/26 >> Azith 10/26 >> 10/27 Rocephin 10/26 >> 10/27 Baricitinib 10/28 >>  Interim history/subjective:   Feels a little better Slept well last night Appetite OK  Objective   Blood pressure 131/80, pulse 65, temperature 97.6 F (36.4 C), temperature source Axillary, resp. rate 15, height 5\' 5"  (1.651 m), weight 74.3 kg, last menstrual period 06/30/1990, SpO2 90 %.    FiO2 (%):  [85 %-100 %] 85 %   Intake/Output Summary (Last 24 hours) at 08/18/2020 0757 Last data filed at 08/18/2020 0600 Gross per 24 hour  Intake 480 ml  Output 1350 ml  Net -870 ml   Filed Weights   08/16/20 0500 08/17/20 0500 08/18/20 0500  Weight: 73.6 kg 74.3 kg 74.3 kg    Examination:  General:  Resting comfortably in bed HENT: NCAT OP clear PULM: Crackles bases B, normal effort CV: RRR, no mgr GI: BS+, soft, nontender MSK: normal bulk and tone Neuro: awake, alert, no distress, MAEW  11/2 CXR images personally reviewed> bilateral infiltrates, improved, no effusion  Resolved Hospital Problem list   R>L pleural effusion resolved Hyponatremia Assessment & Plan:  Acute respiratory failure with hypoxemia due to ARDS from COVID 19 pneumonia: improving slowly Continue baricitinib Decrease dose of solumedrol to 40mg  IV q12h Wean off heated high flow oxygen Mobilize Tolerate periods of hypoxemia, goal at rest is greater than 85% SaO2, with movement ideally above 75% Decision for intubation should be based  on a change in mental status or physical evidence of ventilatory failure such as nasal flaring, accessory muscle use, paradoxical breathing Out of bed to chair as able Incentive spirometry is important, use every hour Prone positioning while in bed   Left lower extremity DVT lovenox full  dose   Best practice:   Per Primary service  We will see M, W, F and prn  Labs   CBC: Recent Labs  Lab 08/12/20 0508 08/13/20 0234 08/18/20 0313  WBC 15.8* 15.1* 14.4*  NEUTROABS  --  13.9*  --   HGB 10.7* 11.4* 12.6  HCT 31.4* 33.7* 38.9  MCV 82.8 83.0 89.0  PLT 342 365 975    Basic Metabolic Panel: Recent Labs  Lab 08/12/20 0508 08/12/20 1701 08/13/20 0234 08/13/20 0234 08/14/20 0244 08/15/20 0400 08/16/20 0308 08/17/20 0311 08/18/20 0313  NA 129*   < > 130*   < > 136 138 136 136 136  K 3.3*   < > 3.8   < > 3.8 4.3 4.2 4.8 5.3*  CL 97*   < > 97*   < > 101 99 102 101 102  CO2 23   < > 24   < > 26 28 26 28 28   GLUCOSE 106*   < > 160*   < > 146* 143* 155* 156* 149*  BUN 11   < > 13   < > 20 19 18 19 16   CREATININE 0.37*   < > 0.40*   < > 0.49 0.42* 0.37* 0.43* 0.42*  CALCIUM 7.6*   < > 7.6*   < > 8.1* 8.2* 7.9* 7.9* 7.9*  MG 1.9  --  2.5*  --   --   --   --   --   --    < > = values in this interval not displayed.   GFR: Estimated Creatinine Clearance: 74.5 mL/min (A) (by C-G formula based on SCr of 0.42 mg/dL (L)). Recent Labs  Lab 08/12/20 0508 08/13/20 0234 08/18/20 0313  PROCALCITON 0.17  --   --   WBC 15.8* 15.1* 14.4*    Liver Function Tests: Recent Labs  Lab 08/14/20 0244 08/15/20 0400 08/16/20 0308 08/17/20 0311 08/18/20 0313  AST 22 18 15 16 17   ALT 63* 50* 44 44 45*  ALKPHOS 122 121 92 91 79  BILITOT 0.5 0.7 0.5 0.5 0.6  PROT 5.6* 5.6* 5.1* 4.9* 4.8*  ALBUMIN 2.4* 2.5* 2.3* 2.2* 2.3*   No results for input(s): LIPASE, AMYLASE in the last 168 hours. No results for input(s): AMMONIA in the last 168 hours.  ABG No results found for: PHART, PCO2ART, PO2ART, HCO3, TCO2, ACIDBASEDEF, O2SAT   Coagulation Profile: No results for input(s): INR, PROTIME in the last 168 hours.  Cardiac Enzymes: No results for input(s): CKTOTAL, CKMB, CKMBINDEX, TROPONINI in the last 168 hours.  HbA1C: No results found for: HGBA1C  CBG: Recent Labs   Lab 08/15/20 1626  GLUCAP 205*     Critical care time: 31 minutes     Roselie Awkward, MD [facility] Pager: [phone] Cell: [phone] If no response, call [phone]

## 2020-08-19 DIAGNOSIS — J1282 Pneumonia due to coronavirus disease 2019: Secondary | ICD-10-CM | POA: Diagnosis not present

## 2020-08-19 DIAGNOSIS — I82442 Acute embolism and thrombosis of left tibial vein: Secondary | ICD-10-CM | POA: Diagnosis not present

## 2020-08-19 DIAGNOSIS — J80 Acute respiratory distress syndrome: Secondary | ICD-10-CM | POA: Diagnosis not present

## 2020-08-19 DIAGNOSIS — U071 COVID-19: Secondary | ICD-10-CM | POA: Diagnosis not present

## 2020-08-19 LAB — CBC
HCT: 37.4 % (ref 36.0–46.0)
Hemoglobin: 12.2 g/dL (ref 12.0–15.0)
MCH: 28.6 pg (ref 26.0–34.0)
MCHC: 32.6 g/dL (ref 30.0–36.0)
MCV: 87.8 fL (ref 80.0–100.0)
Platelets: 378 10*3/uL (ref 150–400)
RBC: 4.26 MIL/uL (ref 3.87–5.11)
RDW: 15.9 % — ABNORMAL HIGH (ref 11.5–15.5)
WBC: 13.4 10*3/uL — ABNORMAL HIGH (ref 4.0–10.5)
nRBC: 0 % (ref 0.0–0.2)

## 2020-08-19 LAB — BASIC METABOLIC PANEL
Anion gap: 8 (ref 5–15)
BUN: 17 mg/dL (ref 8–23)
CO2: 27 mmol/L (ref 22–32)
Calcium: 8 mg/dL — ABNORMAL LOW (ref 8.9–10.3)
Chloride: 100 mmol/L (ref 98–111)
Creatinine, Ser: 0.44 mg/dL (ref 0.44–1.00)
GFR, Estimated: >60 mL/min (ref >60)
Glucose, Bld: 146 mg/dL — ABNORMAL HIGH (ref 70–99)
Potassium: 4.9 mmol/L (ref 3.5–5.1)
Sodium: 135 mmol/L (ref 135–145)

## 2020-08-19 MED ORDER — GUAIFENESIN ER 600 MG PO TB12
1200.0000 mg | ORAL_TABLET | Freq: Two times a day (BID) | ORAL | Status: DC
  Administered 2020-08-19 – 2020-08-24 (×11): 1200 mg via ORAL
  Filled 2020-08-19 (×12): qty 2

## 2020-08-19 MED ORDER — IPRATROPIUM-ALBUTEROL 20-100 MCG/ACT IN AERS
1.0000 | INHALATION_SPRAY | Freq: Three times a day (TID) | RESPIRATORY_TRACT | Status: DC
  Administered 2020-08-19 – 2020-08-24 (×17): 1 via RESPIRATORY_TRACT
  Filled 2020-08-19 (×2): qty 4

## 2020-08-19 NOTE — Progress Notes (Signed)
PROGRESS NOTE  NANCIE BOCANEGRA GYF:[id number] DOB: [birth year] DOA: 08/10/2020 PCP: Patient, No Pcp Per   LOS: 9 days   Brief narrative: As per HPI,  JOJO GEVING is a 59 y.o. female with past medical history of irritable bowel syndrome, hyperlipidemia, history of melanoma who was diagnosed of covid-19 on10/15/21 s/p monoclonal antibody 10/20 presented to the ED on 08/10/20 due to worsening dyspnea and was found to be profoundly hypoxemic.She was also significantly hyponatremic with sodium of  109 on initial presentation.. CTA chest (due to elevated d-dimer) showed no PE, bilateral (R > L) pleural effusions, and patchy diffuse infiltrates. She was admitted to the [facility] service, started on remdesivir, steroids, and empiric antibiotics. Subsequently,venous U/S revealed an acute left posterior tibial DVT for which heparin drip was started. Sodium level has improved, thoracentesis was performed, and the patient's care was transferred to the hospitalist service 08/12/20.    Patient still remains hypoxic on high flow nasal cannula.  PCCM is actively following.  Symptomatically patient has improved.     Assessment/Plan:  Principal Problem:   Pneumonia due to COVID-19 virus Active Problems:   S/P thoracentesis   Pleural effusion, right   Hyponatremia   ARDS (adult respiratory distress syndrome) (HCC)   DVT (deep venous thrombosis) (HCC)   Pulmonary hypertension (HCC)  Acute hypoxemic respiratory failure due to ARDS due to covid-19 pneumonia:  Patient was positive for Covid pneumonia on  07/30/20, s/p bamlanivimab 08/04/20. Currently on high flow nasal cannula at that 25 L/min saturating around 82%.  Pulmonary following.  Completed a 5-day course of remdesivir.  Currently on  Solu-Medrol IV every 12H, baracitinib.  Encouraged proning.  Continue airborne, contact precautions for 21 days from positive testing.CT angiogram of the chest was negative for pulmonary embolism.  Trend inflammatory  biomarkers.  Chest x-ray repeated on 08/17/2020 showed multifocal bilateral pulmonary infiltrates right more than the left without interim changes.  COVID-19 Labs   Recent Labs    08/17/20 0311 08/18/20 0313  DDIMER 2.60* 2.31*  FERRITIN 308* 357*  CRP 0.9 0.7    No results found for: [id number]   Possible acute right-sided heart failure ( acute cor pulmonale,) transudative pleural effusion:  Status post thoracocentesis on 08/10/2020.  2 D Echocardiogram with elevated RVSP and dilated/blunted IVC.  Gram stain of the fluid was negative.  Received IV diuresis with significant response.  Negative for 3737 mL.  Continue intake and output charting, daily weights.  Lasix on hold at this time.  Could consider depending upon clinical response.  Clinically looks euvolemic.   Hyponatremia:  Improved at this time.   Acute left posterior tibial DVT:  On Lovenox for anticoagulation.  Hypokalemia:  Replenished.  Potassium of 4.9 today.  DVT prophylaxis: SCDs Start: 08/10/20 0651, lovenox subq  Code Status: Full code  Family Communication: I tried to reach the patient's spouse [name] on the phone but was unable to reach him today.  Status is: Inpatient  Remains inpatient appropriate because:IV treatments appropriate due to intensity of illness or inability to take PO and Inpatient level of care appropriate due to severity of illness, high oxygen demand   Dispo: The patient is from: Home              Anticipated d/c is to: Home              Anticipated d/c date is: 3 days or more              Patient  currently is not medically stable to d/c.  Follow PCCM recommendations.  Consultants:  PCCM  Procedures:  Thoracocentesis on 08/10/2020.  Antibiotics:  . remdesivir IV 10/26>10/30   Subjective: Today, patient denies interval complaints.  Denies any chest pain, fever, chills or rigor.  Denies increasing shortness of breath.   Objective: Vitals:   08/19/20 0500 08/19/20 0600   BP: 114/76 124/82  Pulse: 67 80  Resp: 14 (!) 29  Temp:    SpO2: 92% (!) 82%    Intake/Output Summary (Last 24 hours) at 08/19/2020 0813 Last data filed at 08/18/2020 2000 Gross per 24 hour  Intake 920 ml  Output 400 ml  Net 520 ml   Filed Weights   08/17/20 0500 08/18/20 0500 08/19/20 0500  Weight: 74.3 kg 74.3 kg 89 kg   Body mass index is 32.65 kg/m.   Physical Exam:  General: Patient is alert awake oriented, on high flow nasal cannula at 25 L/min, not in obvious distress.  Obese HENT:   No scleral pallor or icterus noted. Oral mucosa is moist.  Chest: Coarse breath sounds noted.  Crackles noted on the basis bilaterally.  Diminished breath sounds bilaterally. CVS: S1 &S2 heard. No murmur.  Regular rate and rhythm. Abdomen: Soft, nontender, nondistended.  Bowel sounds are heard.   Extremities: No cyanosis, clubbing or edema.  Peripheral pulses are palpable. Psych: Alert, awake and oriented, normal mood CNS:  No cranial nerve deficits.  Moving all extremities. Skin: Warm and dry.  No rashes noted.   Data Review: I have personally reviewed the following laboratory data and studies,  CBC: Recent Labs  Lab 08/13/20 0234 08/18/20 0313 08/19/20 0300  WBC 15.1* 14.4* 13.4*  NEUTROABS 13.9*  --   --   HGB 11.4* 12.6 12.2  HCT 33.7* 38.9 37.4  MCV 83.0 89.0 87.8  PLT 365 398 185   Basic Metabolic Panel: Recent Labs  Lab 08/13/20 0234 08/14/20 0244 08/15/20 0400 08/16/20 0308 08/17/20 0311 08/18/20 0313 08/19/20 0300  NA 130*   < > 138 136 136 136 135  K 3.8   < > 4.3 4.2 4.8 5.3* 4.9  CL 97*   < > 99 102 101 102 100  CO2 24   < > 28 26 28 28 27   GLUCOSE 160*   < > 143* 155* 156* 149* 146*  BUN 13   < > 19 18 19 16 17   CREATININE 0.40*   < > 0.42* 0.37* 0.43* 0.42* 0.44  CALCIUM 7.6*   < > 8.2* 7.9* 7.9* 7.9* 8.0*  MG 2.5*  --   --   --   --   --   --    < > = values in this interval not displayed.   Liver Function Tests: Recent Labs  Lab 08/14/20 0244  08/15/20 0400 08/16/20 0308 08/17/20 0311 08/18/20 0313  AST 22 18 15 16 17   ALT 63* 50* 44 44 45*  ALKPHOS 122 121 92 91 79  BILITOT 0.5 0.7 0.5 0.5 0.6  PROT 5.6* 5.6* 5.1* 4.9* 4.8*  ALBUMIN 2.4* 2.5* 2.3* 2.2* 2.3*   No results for input(s): LIPASE, AMYLASE in the last 168 hours. No results for input(s): AMMONIA in the last 168 hours. Cardiac Enzymes: No results for input(s): CKTOTAL, CKMB, CKMBINDEX, TROPONINI in the last 168 hours. BNP (last 3 results) Recent Labs    08/10/20 0952  BNP 859.5*    ProBNP (last 3 results) No results for input(s): PROBNP in the last 8760  hours.  CBG: Recent Labs  Lab 08/15/20 1626  GLUCAP 205*   Recent Results (from the past 240 hour(s))  Blood Culture (routine x 2)     Status: None   Collection Time: 08/10/20  3:04 AM   Specimen: BLOOD  Result Value Ref Range Status   Specimen Description   Final    BLOOD BLOOD LEFT FOREARM Performed at [facility] [address], [place], [place] [address]    Special Requests   Final    BOTTLES DRAWN AEROBIC AND ANAEROBIC Blood Culture adequate volume Performed at [facility] [address], [place], [place] [address]    Culture   Final    NO GROWTH 5 DAYS Performed at [facility], [place] [address], [place], [place] [address]    Report Status 08/15/2020 FINAL  Final  Blood Culture (routine x 2)     Status: None   Collection Time: 08/10/20  3:04 AM   Specimen: BLOOD  Result Value Ref Range Status   Specimen Description   Final    BLOOD LEFT ANTECUBITAL Performed at [facility] [address] [place], [place] [address]    Special Requests   Final    BOTTLES DRAWN AEROBIC AND ANAEROBIC Blood Culture adequate volume Performed at [facility] [address], [place], [place] [address]    Culture   Final    NO GROWTH 5 DAYS Performed at [facility], [place] [address], [place], [place] [address]      Report Status 08/15/2020 FINAL  Final  Body fluid culture     Status: None   Collection Time: 08/10/20 12:38 PM   Specimen: Thoracentesis; Body Fluid  Result Value Ref Range Status   Specimen Description   Final    THORACENTESIS RIGHT Performed at [facility] [address] [place], [place] [address]    Special Requests   Final    NONE Performed at [facility], [place] [address] [place], [place] [address]    Gram Stain   Final    RARE WBC PRESENT, PREDOMINANTLY MONONUCLEAR NO ORGANISMS SEEN    Culture   Final    NO GROWTH Performed at [facility], [place] [address], [place], [place] [address]    Report Status 08/13/2020 FINAL  Final  MRSA PCR Screening     Status: None   Collection Time: 08/10/20  1:30 PM   Specimen: Nasopharyngeal  Result Value Ref Range Status   MRSA by PCR NEGATIVE NEGATIVE Final    Comment:        The GeneXpert MRSA Assay (FDA approved for NASAL specimens only), is one component of a comprehensive MRSA colonization surveillance program. It is not intended to diagnose MRSA infection nor to guide or monitor treatment for MRSA infections. Performed at [facility], [place] [address] [place], [place] [address]      Studies: No results found.    Flora Lipps, MD  [facility] 08/19/2020

## 2020-08-19 NOTE — Progress Notes (Signed)
NAME:  Sharon Kirk, MRN:  [id number], DOB:  [birth year], LOS: 9 ADMISSION DATE:  08/10/2020, CONSULTATION DATE:  04/05/20 REFERRING MD:  Tomi Bamberger, CHIEF COMPLAINT:  Dyspnea  Brief History   59 year old presents with hyponatremia and Covid positive pneumonia with profound hypoxemia.  Diagnosed on 15th, administered bamlanivimab / etesevimab received antibiotics on 10/20, admitted 10/26.  Past Medical History  Allergies  HLD  IBS  Osteopenia  Cancer  Thyroid Nodule   Significant Hospital Events   10/26 Admit with COVID.  CTA negative for PE, LLE positive for DVT.  R Thora 661ml borderline exudate 10/27 antibiotics stopped, no evidence of bacterial process 10/29 Increasing oxygen requirements, 40L + NRB.  Started on baricitinib 11/01 Remains on 30L flow / 100% HHFNC  Consults:  PCCM  Procedures:  none  Significant Diagnostic Tests:   CT angiogram 10/26 >> Showing diffuse areas of patchy groundglass opacities bilateral there is consolidated lung disease in the right upper lobe, middle and right lower lobe, there is a moderate sized right pleural effusion very small left pleural effusion no central pulmonary embolus appreciated  Lower extremity Dopplers/ultrasound 10/26 >> There is no DVT in the right, however there was left posterior tibial DVT  Echocardiogram 10/26 >>  Left ventricular ejection fraction, by estimation, is 60 to 65%, NML LV fxn, The mid-free wall of the right ventricle is hypokinetic, while apical  contractility and overall excursion of the base are normal. There is also enhanced respiratory septal displacement. This may represent a forme frust of McConnell's sign (acute cor pulmonale). Right ventricular systolic function is normal.The inferior vena cava is dilated in size with <50% respiratory variability, suggesting right atrial pressure of 15 mmHg.   Right thoracentesis 10/26 >> volume 650 ml. (F)LDH: 215, (S) LDH: 476, (F) protein <3, (S) protein 6; cell ct: clear,  no orgs, neutrophil ct 66  Micro Data:  BCx2 10/26 >> negative  U. Strep 10/26 >> negative  U. Legionella 10/26 >> negative  R Pleural Fluid Culture 10/26 >> negative   Antimicrobials:  Remdesivir 10/26 >> Azith 10/26 >> 10/27 Rocephin 10/26 >> 10/27 Baricitinib 10/28 >>  Interim history/subjective:   Exercise Flutter mucinex  Objective   Blood pressure 124/82, pulse 80, temperature 97.7 F (36.5 C), temperature source Oral, resp. rate (!) 29, height 5\' 5"  (1.651 m), weight 89 kg, last menstrual period 06/30/1990, SpO2 (!) 82 %.    FiO2 (%):  [70 %-85 %] 70 %   Intake/Output Summary (Last 24 hours) at 08/19/2020 0752 Last data filed at 08/18/2020 2000 Gross per 24 hour  Intake 1160 ml  Output 400 ml  Net 760 ml   Filed Weights   08/17/20 0500 08/18/20 0500 08/19/20 0500  Weight: 74.3 kg 74.3 kg 89 kg    Examination:  General:  Resting comfortably in bed HENT: NCAT OP clear PULM: Crackles bases B, normal effort CV: RRR, no mgr GI: BS+, soft, nontender MSK: normal bulk and tone Neuro: awake, alert, no distress, MAEW  11/2 CXR images personally reviewed> bilateral infiltrates, improved, no effusion  Resolved Hospital Problem list   R>L pleural effusion resolved Hyponatremia Assessment & Plan:  Acute respiratory failure with hypoxemia due to ARDS from COVID 19 pneumonia: 11/4 still improving slowly Some chest congestion Continue solumedrol 40mg  IV q12h Continue baricitinib Mobilize> have encouraged her to do sit to stands, out of bed etc Add flutter valve Add guaifenesin Tolerate periods of hypoxemia, goal at rest is greater than 85% SaO2, with movement ideally  above 75% Decision for intubation should be based on a change in mental status or physical evidence of ventilatory failure such as nasal flaring, accessory muscle use, paradoxical breathing Out of bed to chair as able Incentive spirometry is important, use every hour Prone positioning while in  bed  Left lower extremity DVT Continue lovenox full dose  Best practice:   Per Primary service   Labs   CBC: Recent Labs  Lab 08/13/20 0234 08/18/20 0313 08/19/20 0300  WBC 15.1* 14.4* 13.4*  NEUTROABS 13.9*  --   --   HGB 11.4* 12.6 12.2  HCT 33.7* 38.9 37.4  MCV 83.0 89.0 87.8  PLT 365 398 035    Basic Metabolic Panel: Recent Labs  Lab 08/13/20 0234 08/14/20 0244 08/15/20 0400 08/16/20 0308 08/17/20 0311 08/18/20 0313 08/19/20 0300  NA 130*   < > 138 136 136 136 135  K 3.8   < > 4.3 4.2 4.8 5.3* 4.9  CL 97*   < > 99 102 101 102 100  CO2 24   < > 28 26 28 28 27   GLUCOSE 160*   < > 143* 155* 156* 149* 146*  BUN 13   < > 19 18 19 16 17   CREATININE 0.40*   < > 0.42* 0.37* 0.43* 0.42* 0.44  CALCIUM 7.6*   < > 8.2* 7.9* 7.9* 7.9* 8.0*  MG 2.5*  --   --   --   --   --   --    < > = values in this interval not displayed.   GFR: Estimated Creatinine Clearance: 81.4 mL/min (by C-G formula based on SCr of 0.44 mg/dL). Recent Labs  Lab 08/13/20 0234 08/18/20 0313 08/19/20 0300  WBC 15.1* 14.4* 13.4*    Liver Function Tests: Recent Labs  Lab 08/14/20 0244 08/15/20 0400 08/16/20 0308 08/17/20 0311 08/18/20 0313  AST 22 18 15 16 17   ALT 63* 50* 44 44 45*  ALKPHOS 122 121 92 91 79  BILITOT 0.5 0.7 0.5 0.5 0.6  PROT 5.6* 5.6* 5.1* 4.9* 4.8*  ALBUMIN 2.4* 2.5* 2.3* 2.2* 2.3*   No results for input(s): LIPASE, AMYLASE in the last 168 hours. No results for input(s): AMMONIA in the last 168 hours.  ABG No results found for: PHART, PCO2ART, PO2ART, HCO3, TCO2, ACIDBASEDEF, O2SAT   Coagulation Profile: No results for input(s): INR, PROTIME in the last 168 hours.  Cardiac Enzymes: No results for input(s): CKTOTAL, CKMB, CKMBINDEX, TROPONINI in the last 168 hours.  HbA1C: No results found for: HGBA1C  CBG: Recent Labs  Lab 08/15/20 1626  GLUCAP 205*     Critical care time: 35 minutes     Roselie Awkward, MD [place] Pager: [phone] Cell:  [phone] If no response, call [phone]

## 2020-08-20 ENCOUNTER — Ambulatory Visit: Payer: Self-pay

## 2020-08-20 DIAGNOSIS — J80 Acute respiratory distress syndrome: Secondary | ICD-10-CM | POA: Diagnosis not present

## 2020-08-20 DIAGNOSIS — R5381 Other malaise: Secondary | ICD-10-CM

## 2020-08-20 DIAGNOSIS — J1282 Pneumonia due to coronavirus disease 2019: Secondary | ICD-10-CM | POA: Diagnosis not present

## 2020-08-20 DIAGNOSIS — U071 COVID-19: Secondary | ICD-10-CM | POA: Diagnosis not present

## 2020-08-20 LAB — COMPREHENSIVE METABOLIC PANEL
ALT: 44 U/L (ref 0–44)
AST: 18 U/L (ref 15–41)
Albumin: 2.3 g/dL — ABNORMAL LOW (ref 3.5–5.0)
Alkaline Phosphatase: 71 U/L (ref 38–126)
Anion gap: 10 (ref 5–15)
BUN: 15 mg/dL (ref 8–23)
CO2: 26 mmol/L (ref 22–32)
Calcium: 7.8 mg/dL — ABNORMAL LOW (ref 8.9–10.3)
Chloride: 98 mmol/L (ref 98–111)
Creatinine, Ser: 0.4 mg/dL — ABNORMAL LOW (ref 0.44–1.00)
GFR, Estimated: >60 mL/min (ref >60)
Glucose, Bld: 148 mg/dL — ABNORMAL HIGH (ref 70–99)
Potassium: 4.3 mmol/L (ref 3.5–5.1)
Sodium: 134 mmol/L — ABNORMAL LOW (ref 135–145)
Total Bilirubin: 0.8 mg/dL (ref 0.3–1.2)
Total Protein: 5 g/dL — ABNORMAL LOW (ref 6.5–8.1)

## 2020-08-20 LAB — CBC WITH DIFFERENTIAL/PLATELET
Abs Immature Granulocytes: 0.12 10*3/uL — ABNORMAL HIGH (ref 0.00–0.07)
Basophils Absolute: 0 10*3/uL (ref 0.0–0.1)
Basophils Relative: 0 %
Eosinophils Absolute: 0 10*3/uL (ref 0.0–0.5)
Eosinophils Relative: 0 %
HCT: 38.4 % (ref 36.0–46.0)
Hemoglobin: 12.3 g/dL (ref 12.0–15.0)
Immature Granulocytes: 1 %
Lymphocytes Relative: 3 %
Lymphs Abs: 0.4 10*3/uL — ABNORMAL LOW (ref 0.7–4.0)
MCH: 28.3 pg (ref 26.0–34.0)
MCHC: 32 g/dL (ref 30.0–36.0)
MCV: 88.3 fL (ref 80.0–100.0)
Monocytes Absolute: 0.4 10*3/uL (ref 0.1–1.0)
Monocytes Relative: 3 %
Neutro Abs: 12.6 10*3/uL — ABNORMAL HIGH (ref 1.7–7.7)
Neutrophils Relative %: 93 %
Platelets: 387 10*3/uL (ref 150–400)
RBC: 4.35 MIL/uL (ref 3.87–5.11)
RDW: 16 % — ABNORMAL HIGH (ref 11.5–15.5)
WBC: 13.5 10*3/uL — ABNORMAL HIGH (ref 4.0–10.5)
nRBC: 0 % (ref 0.0–0.2)

## 2020-08-20 LAB — FERRITIN: Ferritin: 426 ng/mL — ABNORMAL HIGH (ref 11–307)

## 2020-08-20 LAB — C-REACTIVE PROTEIN: CRP: 0.5 mg/dL (ref <1.0)

## 2020-08-20 LAB — D-DIMER, QUANTITATIVE: D-Dimer, Quant: 1.9 ug/mL-FEU — ABNORMAL HIGH (ref 0.00–0.50)

## 2020-08-20 NOTE — Progress Notes (Signed)
NAME:  Sharon Kirk, MRN:  [id number], DOB:  October 08, 1961, LOS: 73 ADMISSION DATE:  08/10/2020, CONSULTATION DATE:  04/05/20 REFERRING MD:  Tomi Bamberger, CHIEF COMPLAINT:  Dyspnea  Brief History   59 year old presents with hyponatremia and Covid positive pneumonia with profound hypoxemia.  Diagnosed on 15th, administered bamlanivimab / etesevimab received antibiotics on 10/20, admitted 10/26.  Past Medical History  Allergies  HLD  IBS  Osteopenia  Cancer  Thyroid Nodule   Significant Hospital Events   10/26 Admit with COVID.  CTA negative for PE, LLE positive for DVT.  R Thora 68ml borderline exudate 10/27 antibiotics stopped, no evidence of bacterial process 10/29 Increasing oxygen requirements, 40L + NRB.  Started on baricitinib 11/01 Remains on 30L flow / 100% HHFNC  Consults:  PCCM  Procedures:  none  Significant Diagnostic Tests:   CT angiogram 10/26 >> Showing diffuse areas of patchy groundglass opacities bilateral there is consolidated lung disease in the right upper lobe, middle and right lower lobe, there is a moderate sized right pleural effusion very small left pleural effusion no central pulmonary embolus appreciated  Lower extremity Dopplers/ultrasound 10/26 >> There is no DVT in the right, however there was left posterior tibial DVT  Echocardiogram 10/26 >>  Left ventricular ejection fraction, by estimation, is 60 to 65%, NML LV fxn, The mid-free wall of the right ventricle is hypokinetic, while apical  contractility and overall excursion of the base are normal. There is also enhanced respiratory septal displacement. This may represent a forme frust of McConnell's sign (acute cor pulmonale). Right ventricular systolic function is normal.The inferior vena cava is dilated in size with <50% respiratory variability, suggesting right atrial pressure of 15 mmHg.   Right thoracentesis 10/26 >> volume 650 ml. (F)LDH: 215, (S) LDH: 476, (F) protein <3, (S) protein 6; cell ct:  clear, no orgs, neutrophil ct 66  Micro Data:  BCx2 10/26 >> negative  U. Strep 10/26 >> negative  U. Legionella 10/26 >> negative  R Pleural Fluid Culture 10/26 >> negative   Antimicrobials:  Remdesivir 10/26 >> Azith 10/26 >> 10/27 Rocephin 10/26 >> 10/27 Baricitinib 10/28 >>  Interim history/subjective:   Oxygenation improving Has been up in chair Feeling better   Objective   Blood pressure 123/74, pulse 73, temperature (!) 97.4 F (36.3 C), temperature source Oral, resp. rate (!) 22, height 5\' 5"  (1.651 m), weight 72.8 kg, last menstrual period 06/30/1990, SpO2 (!) 83 %.    FiO2 (%):  [40 %-70 %] 40 %   Intake/Output Summary (Last 24 hours) at 08/20/2020 0729 Last data filed at 08/20/2020 0500 Gross per 24 hour  Intake 900 ml  Output 1675 ml  Net -775 ml   Filed Weights   08/18/20 0500 08/19/20 0500 08/20/20 0500  Weight: 74.3 kg 89 kg 72.8 kg    Examination:  General:  Resting comfortably in bed HENT: NCAT OP clear PULM: Crackles bases B, normal effort CV: RRR, no mgr GI: BS+, soft, nontender MSK: normal bulk and tone Neuro: awake, alert, no distress, MAEW   11/2 CXR images personally reviewed> bilateral infiltrates, improved, no effusion  Resolved Hospital Problem list   R>L pleural effusion resolved Hyponatremia Assessment & Plan:  Acute respiratory failure with hypoxemia due to ARDS from COVID 19 pneumonia: 11/5 steady improvement Some chest congestion Once on salter nasal cannula oxygen would change prednisone to 20mg  daily and plan on giving that another 7 days baricitinib for planned 14 days or hospital discharge Out of bed Flutter  Guaifenesin Tolerate periods of hypoxemia, goal at rest is greater than 85% SaO2, with movement ideally above 75% Decision for intubation should be based on a change in mental status or physical evidence of ventilatory failure such as nasal flaring, accessory muscle use, paradoxical breathing Out of bed to chair as  able Incentive spirometry is important, use every hour Prone positioning while in bed   Left lower extremity DVT Full dose lovenox  Best practice:   PCCM will be available prn   Labs   CBC: Recent Labs  Lab 08/18/20 0313 08/19/20 0300 08/20/20 0256  WBC 14.4* 13.4* 13.5*  NEUTROABS  --   --  12.6*  HGB 12.6 12.2 12.3  HCT 38.9 37.4 38.4  MCV 89.0 87.8 88.3  PLT 398 378 676    Basic Metabolic Panel: Recent Labs  Lab 08/16/20 0308 08/17/20 0311 08/18/20 0313 08/19/20 0300 08/20/20 0256  NA 136 136 136 135 134*  K 4.2 4.8 5.3* 4.9 4.3  CL 102 101 102 100 98  CO2 26 28 28 27 26   GLUCOSE 155* 156* 149* 146* 148*  BUN 18 19 16 17 15   CREATININE 0.37* 0.43* 0.42* 0.44 0.40*  CALCIUM 7.9* 7.9* 7.9* 8.0* 7.8*   GFR: Estimated Creatinine Clearance: 73.8 mL/min (A) (by C-G formula based on SCr of 0.4 mg/dL (L)). Recent Labs  Lab 08/18/20 0313 08/19/20 0300 08/20/20 0256  WBC 14.4* 13.4* 13.5*    Liver Function Tests: Recent Labs  Lab 08/15/20 0400 08/16/20 0308 08/17/20 0311 08/18/20 0313 08/20/20 0256  AST 18 15 16 17 18   ALT 50* 44 44 45* 44  ALKPHOS 121 92 91 79 71  BILITOT 0.7 0.5 0.5 0.6 0.8  PROT 5.6* 5.1* 4.9* 4.8* 5.0*  ALBUMIN 2.5* 2.3* 2.2* 2.3* 2.3*   No results for input(s): LIPASE, AMYLASE in the last 168 hours. No results for input(s): AMMONIA in the last 168 hours.  ABG No results found for: PHART, PCO2ART, PO2ART, HCO3, TCO2, ACIDBASEDEF, O2SAT   Coagulation Profile: No results for input(s): INR, PROTIME in the last 168 hours.  Cardiac Enzymes: No results for input(s): CKTOTAL, CKMB, CKMBINDEX, TROPONINI in the last 168 hours.  HbA1C: No results found for: HGBA1C  CBG: Recent Labs  Lab 08/15/20 1626  GLUCAP 205*     Critical care time: n/a     Roselie Awkward, MD [facility] Pager: [phone] Cell: [phone] If no response, call [phone]

## 2020-08-20 NOTE — Progress Notes (Signed)
PROGRESS NOTE  Sharon Kirk LDJ:[id number] DOB: [birth year]   PCP: Patient, No Pcp Per  Patient is from: Home  DOA: 08/10/2020 LOS: 12  Chief complaints: Shortness of breath  Brief Narrative / Interim history: 59 year old female with history of IBS, HLD and melanoma presenting with progressive dyspnea and admitted to ICU for acute hypoxemic respiratory failure due to COVID-19 infection, bilateral pleural effusion and severe hyponatremia to 109 on 08/10/2020.  Reportedly fully vaccinated including booster.  Tested positive for COVID-19 on 07/30/2020.  Received monoclonal antibody on 10/20.  CTA chest negative for PE but patchy diffuse infiltrates.  LE US revealed left posterior tibial DVT.  Started on anticoagulation.  She had right thoracocentesis with removal of 650 cc borderline exudative and culture negative fluid.  Eventually transferred [facility] on 08/12/2020.  PCCM following.  Slowly improving.  Subjective: Seen and examined earlier this morning.  No major events overnight of this morning.  Still with borderline oxygen on 25 L by HFNC but reports feeling better.  Reports intermittent cough.  Denies chest pain, GI or UTI symptoms.  Objective: Vitals:   08/20/20 0800 08/20/20 0818 08/20/20 0900 08/20/20 1000  BP: 119/85  115/60 109/64  Pulse: 80  88 (!) 102  Resp: 18     Temp: 98.9 F (37.2 C)     TempSrc: Oral     SpO2: (!) 82% (!) 86% 90% 95%  Weight:      Height:        Intake/Output Summary (Last 24 hours) at 08/20/2020 1157 Last data filed at 08/20/2020 0500 Gross per 24 hour  Intake 900 ml  Output 1675 ml  Net -775 ml   Filed Weights   08/18/20 0500 08/19/20 0500 08/20/20 0500  Weight: 74.3 kg 89 kg 72.8 kg    Examination:  GENERAL: No apparent distress.  Nontoxic. HEENT: MMM.  Vision and hearing grossly intact.  NECK: Supple.  No apparent JVD.  RESP: 90% on 25 L / 40%.  No IWOB.  Diminished aeration.  Bilateral crackles CVS:  RRR.  Heart sounds normal.  ABD/GI/GU: BS+. Abd soft, NTND.  MSK/EXT:  Moves extremities. No apparent deformity. No edema.  SKIN: no apparent skin lesion or wound NEURO: Awake, alert and oriented appropriately.  No apparent focal neuro deficit. PSYCH: Calm. Normal affect.  Procedures:  10/26-right thoracocentesis with removal of 650 cc borderline exudative and culture negative fluid  Microbiology summarized: BCx2 10/26 >> negative  U. Strep 10/26 >> negative  U. Legionella 10/26 >> negative  R Pleural Fluid Culture 10/26 >> negative   Assessment & Plan: Acute respiratory failure with hypoxia due to COVID-19 pneumonia: Reportedly fully vaccinated including booster.   Tested positive on 07/30/2020. Symptomatic for over 10 days days prior to admission.  CTA chest negative for PE but bilateral patchy infiltrates.  Also bilateral pleural effusion, Rt>Lt.  Recent Labs    08/18/20 0313 08/20/20 0256  DDIMER 2.31* 1.90*  FERRITIN 357* 426*  CRP 0.7 0.5  -Status post monoclonal antibody infusion on 10/20. -Completed 5 days of remdesivir on 10/30. -On baricitinib and Solu-Medrol per PCCM. -On therapeutic dose Lovenox for LLE DVT. -Continue Protonix for GI prophylaxis. -Supportive care with inhalers, mucolytic/antitussive, vitamins and incentive spirometry. -OOB/PT/OT -Wean oxygen as able. -Monitor inflammatory markers.  Possible acute cor pulmonale:  TTE on 10/26 with EF of 60 to 65%, McConnell sign and RVSP of 43.5 mmHg.  Could be due to the above.  Diuresed with IV Lasix  with excellent  urine output.  Diuretics discontinued.   -Lasix as needed intermittently. -Monitor fluid status  Hyponatremia: Na 109>>>134 -Continue monitoring  Acute left posterior tibial DVT:  -on Lovenox for anticoagulation.  Hypokalemia: Resolved.  Debility/physical deconditioning -OOB/PT/OT   Body mass index is 26.71 kg/m.         DVT prophylaxis:  SCDs Start: 08/10/20 0651  Code Status: On  therapeutic Lovenox for LLE DVT Family Communication: Updated patient's husband over the phone. Status is: Inpatient  Remains inpatient appropriate because:IV treatments appropriate due to intensity of illness or inability to take PO, Inpatient level of care appropriate due to severity of illness and High oxygen requirement   Dispo: The patient is from: Home              Anticipated d/c is to: Home              Anticipated d/c date is: > 3 days              Patient currently is not medically stable to d/c.       Consultants:  PCCM   Sch Meds:  Scheduled Meds: . baricitinib  4 mg Oral Daily  . Chlorhexidine Gluconate Cloth  6 each Topical Daily  . chlorpheniramine-HYDROcodone  5 mL Oral Q12H  . enoxaparin (LOVENOX) injection  70 mg Subcutaneous Q12H  . guaiFENesin  1,200 mg Oral BID  . Ipratropium-Albuterol  1 puff Inhalation TID  . mouth rinse  15 mL Mouth Rinse BID  . methylPREDNISolone (SOLU-MEDROL) injection  40 mg Intravenous Q12H  . pantoprazole  40 mg Oral Daily  . sodium chloride flush  10 mL Intravenous Q12H   Continuous Infusions: PRN Meds:.acetaminophen, albuterol, docusate sodium, guaiFENesin-dextromethorphan, ondansetron (ZOFRAN) IV, polyethylene glycol, saline, sodium chloride  Antimicrobials: Anti-infectives (From admission, onward)   Start     Dose/Rate Route Frequency Ordered Stop   08/11/20 1000  remdesivir 100 mg in sodium chloride 0.9 % 100 mL IVPB       "Followed by" Linked Group Details   100 mg 200 mL/hr over 30 Minutes Intravenous Daily 08/10/20 0719 08/14/20 1104   08/10/20 0800  cefTRIAXone (ROCEPHIN) 2 g in sodium chloride 0.9 % 100 mL IVPB  Status:  Discontinued        2 g 200 mL/hr over 30 Minutes Intravenous Every 24 hours 08/10/20 0703 08/11/20 1056   08/10/20 0800  azithromycin (ZITHROMAX) 500 mg in sodium chloride 0.9 % 250 mL IVPB  Status:  Discontinued        500 mg 250 mL/hr over 60 Minutes Intravenous Every 24 hours 08/10/20 0703  08/11/20 1056   08/10/20 0730  remdesivir 200 mg in sodium chloride 0.9% 250 mL IVPB       "Followed by" Linked Group Details   200 mg 580 mL/hr over 30 Minutes Intravenous Once 08/10/20 0719 08/10/20 0953       I have personally reviewed the following labs and images: CBC: Recent Labs  Lab 08/18/20 0313 08/19/20 0300 08/20/20 0256  WBC 14.4* 13.4* 13.5*  NEUTROABS  --   --  12.6*  HGB 12.6 12.2 12.3  HCT 38.9 37.4 38.4  MCV 89.0 87.8 88.3  PLT 398 378 387   BMP &GFR Recent Labs  Lab 08/16/20 0308 08/17/20 0311 08/18/20 0313 08/19/20 0300 08/20/20 0256  NA 136 136 136 135 134*  K 4.2 4.8 5.3* 4.9 4.3  CL 102 101 102 100 98  CO2 26 28 28 27 26   GLUCOSE 155*  156* 149* 146* 148*  BUN 18 19 16 17 15   CREATININE 0.37* 0.43* 0.42* 0.44 0.40*  CALCIUM 7.9* 7.9* 7.9* 8.0* 7.8*   Estimated Creatinine Clearance: 73.8 mL/min (A) (by C-G formula based on SCr of 0.4 mg/dL (L)). Liver & Pancreas: Recent Labs  Lab 08/15/20 0400 08/16/20 0308 08/17/20 0311 08/18/20 0313 08/20/20 0256  AST 18 15 16 17 18   ALT 50* 44 44 45* 44  ALKPHOS 121 92 91 79 71  BILITOT 0.7 0.5 0.5 0.6 0.8  PROT 5.6* 5.1* 4.9* 4.8* 5.0*  ALBUMIN 2.5* 2.3* 2.2* 2.3* 2.3*   No results for input(s): LIPASE, AMYLASE in the last 168 hours. No results for input(s): AMMONIA in the last 168 hours. Diabetic: No results for input(s): HGBA1C in the last 72 hours. Recent Labs  Lab 08/15/20 1626  GLUCAP 205*   Cardiac Enzymes: No results for input(s): CKTOTAL, CKMB, CKMBINDEX, TROPONINI in the last 168 hours. No results for input(s): PROBNP in the last 8760 hours. Coagulation Profile: No results for input(s): INR, PROTIME in the last 168 hours. Thyroid Function Tests: No results for input(s): TSH, T4TOTAL, FREET4, T3FREE, THYROIDAB in the last 72 hours. Lipid Profile: No results for input(s): CHOL, HDL, LDLCALC, TRIG, CHOLHDL, LDLDIRECT in the last 72 hours. Anemia Panel: Recent Labs     08/18/20 0313 08/20/20 0256  FERRITIN 357* 426*   Urine analysis:    Component Value Date/Time   COLORURINE YELLOW 06/11/2017 0858   APPEARANCEUR CLEAR 06/11/2017 0858   LABSPEC 1.015 06/11/2017 0858   PHURINE 6.0 06/11/2017 0858   GLUCOSEU NEGATIVE 06/11/2017 0858   HGBUR NEGATIVE 06/11/2017 0858   BILIRUBINUR NEGATIVE 06/11/2017 0858   KETONESUR NEGATIVE 06/11/2017 0858   PROTEINUR NEGATIVE 06/11/2017 0858   UROBILINOGEN 0.2 08/20/2014 0814   NITRITE NEGATIVE 06/11/2017 0858   LEUKOCYTESUR NEGATIVE 06/11/2017 0858   Sepsis Labs: Invalid input(s): PROCALCITONIN, [place]  Microbiology: Recent Results (from the past 240 hour(s))  Body fluid culture     Status: None   Collection Time: 08/10/20 12:38 PM   Specimen: Thoracentesis; Body Fluid  Result Value Ref Range Status   Specimen Description   Final    THORACENTESIS RIGHT Performed at [facility] [address], [place], [place] [address]    Special Requests   Final    NONE Performed at [facility], [place] [address] [place], [place] [address]    Gram Stain   Final    RARE WBC PRESENT, PREDOMINANTLY MONONUCLEAR NO ORGANISMS SEEN    Culture   Final    NO GROWTH Performed at [facility], [place] [address] [place], [place] [address]    Report Status 08/13/2020 FINAL  Final  MRSA PCR Screening     Status: None   Collection Time: 08/10/20  1:30 PM   Specimen: Nasopharyngeal  Result Value Ref Range Status   MRSA by PCR NEGATIVE NEGATIVE Final    Comment:        The GeneXpert MRSA Assay (FDA approved for NASAL specimens only), is one component of a comprehensive MRSA colonization surveillance program. It is not intended to diagnose MRSA infection nor to guide or monitor treatment for MRSA infections. Performed at [facility], [place] [address], [place], [place] [address]     Radiology Studies: No results found.    Hilliary Jock T. [place]  If 7PM-7AM, please contact night-coverage [email or web address] 08/20/2020, 11:57 AM

## 2020-08-21 LAB — COMPREHENSIVE METABOLIC PANEL
ALT: 52 U/L — ABNORMAL HIGH (ref 0–44)
AST: 18 U/L (ref 15–41)
Albumin: 2.5 g/dL — ABNORMAL LOW (ref 3.5–5.0)
Alkaline Phosphatase: 66 U/L (ref 38–126)
Anion gap: 9 (ref 5–15)
BUN: 19 mg/dL (ref 8–23)
CO2: 28 mmol/L (ref 22–32)
Calcium: 8.1 mg/dL — ABNORMAL LOW (ref 8.9–10.3)
Chloride: 99 mmol/L (ref 98–111)
Creatinine, Ser: 0.46 mg/dL (ref 0.44–1.00)
GFR, Estimated: >60 mL/min (ref >60)
Glucose, Bld: 144 mg/dL — ABNORMAL HIGH (ref 70–99)
Potassium: 4.7 mmol/L (ref 3.5–5.1)
Sodium: 136 mmol/L (ref 135–145)
Total Bilirubin: 0.9 mg/dL (ref 0.3–1.2)
Total Protein: 5.1 g/dL — ABNORMAL LOW (ref 6.5–8.1)

## 2020-08-21 LAB — CBC WITH DIFFERENTIAL/PLATELET
Abs Immature Granulocytes: 0.21 10*3/uL — ABNORMAL HIGH (ref 0.00–0.07)
Basophils Absolute: 0 10*3/uL (ref 0.0–0.1)
Basophils Relative: 0 %
Eosinophils Absolute: 0 10*3/uL (ref 0.0–0.5)
Eosinophils Relative: 0 %
HCT: 40.4 % (ref 36.0–46.0)
Hemoglobin: 12.9 g/dL (ref 12.0–15.0)
Immature Granulocytes: 2 %
Lymphocytes Relative: 4 %
Lymphs Abs: 0.5 10*3/uL — ABNORMAL LOW (ref 0.7–4.0)
MCH: 28.4 pg (ref 26.0–34.0)
MCHC: 31.9 g/dL (ref 30.0–36.0)
MCV: 89 fL (ref 80.0–100.0)
Monocytes Absolute: 0.3 10*3/uL (ref 0.1–1.0)
Monocytes Relative: 2 %
Neutro Abs: 12.3 10*3/uL — ABNORMAL HIGH (ref 1.7–7.7)
Neutrophils Relative %: 92 %
Platelets: 411 10*3/uL — ABNORMAL HIGH (ref 150–400)
RBC: 4.54 MIL/uL (ref 3.87–5.11)
RDW: 16.3 % — ABNORMAL HIGH (ref 11.5–15.5)
WBC: 13.3 10*3/uL — ABNORMAL HIGH (ref 4.0–10.5)
nRBC: 0 % (ref 0.0–0.2)

## 2020-08-21 LAB — FERRITIN: Ferritin: 417 ng/mL — ABNORMAL HIGH (ref 11–307)

## 2020-08-21 LAB — D-DIMER, QUANTITATIVE: D-Dimer, Quant: 2.31 ug/mL-FEU — ABNORMAL HIGH (ref 0.00–0.50)

## 2020-08-21 LAB — C-REACTIVE PROTEIN: CRP: <0.5 mg/dL (ref <1.0)

## 2020-08-21 NOTE — Progress Notes (Signed)
PROGRESS NOTE  Sharon Kirk:[id number] DOB: 29-May-1961   PCP: Patient, No Pcp Per  Patient is from: Home  DOA: 08/10/2020 LOS: 33  Chief complaints: Shortness of breath  Brief Narrative / Interim history: 59 year old female with history of IBS, HLD and melanoma presenting with progressive dyspnea and admitted to ICU for acute hypoxemic respiratory failure due to COVID-19 infection, bilateral pleural effusion and severe hyponatremia to 109 on 08/10/2020.  Reportedly fully vaccinated including booster.  Tested positive for COVID-19 on 07/30/2020.  Received monoclonal antibody on 10/20.  CTA chest negative for PE but patchy diffuse infiltrates.  LE US revealed left posterior tibial DVT.  Started on anticoagulation.  She had right thoracocentesis with removal of 650 cc borderline exudative and culture negative fluid.  Eventually transferred to [facility] service on 08/12/2020.  PCCM following.  Slowly improving.  Subjective: Seen and examined earlier this morning.  No major events overnight of this morning.  Feels better.  Now on 12 L.  Denies shortness of breath, chest pain, GI or UTI symptoms.  Admits to intermittent cough.  Objective: Vitals:   08/21/20 0700 08/21/20 0800 08/21/20 0817 08/21/20 0900  BP: 114/83 105/76  (!) 101/55  Pulse: 82 67 85 80  Resp: (!) 21 18 (!) 23 19  Temp:  97.6 F (36.4 C)    TempSrc:  Oral    SpO2: 98% 98% (!) 89% 97%  Weight:      Height:        Intake/Output Summary (Last 24 hours) at 08/21/2020 1025 Last data filed at 08/21/2020 0951 Gross per 24 hour  Intake 995 ml  Output 1800 ml  Net -805 ml   Filed Weights   08/19/20 0500 08/20/20 0500 08/21/20 0500  Weight: 89 kg 72.8 kg 71.8 kg    Examination:  GENERAL: No apparent distress.  Nontoxic. HEENT: MMM.  Vision and hearing grossly intact.  NECK: Supple.  No apparent JVD.  RESP: 97% on 10 L by HFNC at rest.  No IWOB.  Diminished aeration.  Bilateral crackles. CVS:  RRR.  Heart sounds normal.  ABD/GI/GU: BS+. Abd soft, NTND.  MSK/EXT:  Moves extremities. No apparent deformity. No edema.  SKIN: no apparent skin lesion or wound NEURO: Awake, alert and oriented appropriately.  No apparent focal neuro deficit. PSYCH: Calm. Normal affect.  Procedures:  10/26-right thoracocentesis with removal of 650 cc borderline exudative and culture negative fluid  Microbiology summarized: BCx2 10/26 >> negative  U. Strep 10/26 >> negative  U. Legionella 10/26 >> negative  R Pleural Fluid Culture 10/26 >> negative   Assessment & Plan: Acute respiratory failure with hypoxia due to COVID-19 pneumonia: Reportedly fully vaccinated including booster.   Tested positive on 07/30/2020. Symptomatic for over 10 days days prior to admission.  CTA chest negative for PE but bilateral patchy infiltrates.  Also bilateral pleural effusion, Rt>Lt. oxygen requirement slowly improving.  Currently requiring 10 L by HFNC.  Recent Labs    08/20/20 0256 08/21/20 0237  DDIMER 1.90* 2.31*  FERRITIN 426* 417*  CRP 0.5 <0.5  -Status post monoclonal antibody infusion on 10/20. -Completed 5 days of remdesivir on 10/30. -On baricitinib and Solu-Medrol -On therapeutic dose Lovenox for LLE DVT. -Continue Protonix for GI prophylaxis. -Supportive care with inhalers, mucolytic/antitussive, vitamins and incentive spirometry. -OOB/PT/OT -Wean oxygen as able. -Monitor inflammatory markers.  Possible acute cor pulmonale:  TTE on 10/26 with EF of 60 to 65%, McConnell sign and RVSP of 43.5 mmHg.  Could be due to  the above.  Diuresed with IV Lasix  with excellent urine output.  Diuretics discontinued.  Appears euvolemic. -Lasix as needed intermittently. -Monitor fluid status  Hyponatremia: Na 109>>>136.  Resolved. -Continue monitoring  Acute left posterior tibial DVT:  -on Lovenox for anticoagulation.  Hypokalemia: Resolved.  Debility/physical deconditioning -OOB/PT/OT   Body mass index is  26.34 kg/m.         DVT prophylaxis:  SCDs Start: 08/10/20 0651  Code Status: On therapeutic Lovenox for LLE DVT Family Communication: Updated patient's husband over the phone.  Status is: Inpatient.  Transfer to telemetry bed.  Remains inpatient appropriate because:IV treatments appropriate due to intensity of illness or inability to take PO, Inpatient level of care appropriate due to severity of illness and High oxygen requirement   Dispo: The patient is from: Home              Anticipated d/c is to: Home              Anticipated d/c date is: > 3 days              Patient currently is not medically stable to d/c.   Consultants:  PCCM   Sch Meds:  Scheduled Meds: . baricitinib  4 mg Oral Daily  . Chlorhexidine Gluconate Cloth  6 each Topical Daily  . chlorpheniramine-HYDROcodone  5 mL Oral Q12H  . enoxaparin (LOVENOX) injection  70 mg Subcutaneous Q12H  . guaiFENesin  1,200 mg Oral BID  . Ipratropium-Albuterol  1 puff Inhalation TID  . mouth rinse  15 mL Mouth Rinse BID  . methylPREDNISolone (SOLU-MEDROL) injection  40 mg Intravenous Q12H  . pantoprazole  40 mg Oral Daily  . sodium chloride flush  10 mL Intravenous Q12H   Continuous Infusions: PRN Meds:.acetaminophen, albuterol, docusate sodium, guaiFENesin-dextromethorphan, ondansetron (ZOFRAN) IV, polyethylene glycol, saline, sodium chloride  Antimicrobials: Anti-infectives (From admission, onward)   Start     Dose/Rate Route Frequency Ordered Stop   08/11/20 1000  remdesivir 100 mg in sodium chloride 0.9 % 100 mL IVPB       "Followed by" Linked Group Details   100 mg 200 mL/hr over 30 Minutes Intravenous Daily 08/10/20 0719 08/14/20 1104   08/10/20 0800  cefTRIAXone (ROCEPHIN) 2 g in sodium chloride 0.9 % 100 mL IVPB  Status:  Discontinued        2 g 200 mL/hr over 30 Minutes Intravenous Every 24 hours 08/10/20 0703 08/11/20 1056   08/10/20 0800  azithromycin (ZITHROMAX) 500 mg in sodium chloride 0.9 % 250 mL  IVPB  Status:  Discontinued        500 mg 250 mL/hr over 60 Minutes Intravenous Every 24 hours 08/10/20 0703 08/11/20 1056   08/10/20 0730  remdesivir 200 mg in sodium chloride 0.9% 250 mL IVPB       "Followed by" Linked Group Details   200 mg 580 mL/hr over 30 Minutes Intravenous Once 08/10/20 0719 08/10/20 0953       I have personally reviewed the following labs and images: CBC: Recent Labs  Lab 08/18/20 0313 08/19/20 0300 08/20/20 0256 08/21/20 0237  WBC 14.4* 13.4* 13.5* 13.3*  NEUTROABS  --   --  12.6* 12.3*  HGB 12.6 12.2 12.3 12.9  HCT 38.9 37.4 38.4 40.4  MCV 89.0 87.8 88.3 89.0  PLT 398 378 387 411*   BMP &GFR Recent Labs  Lab 08/17/20 0311 08/18/20 0313 08/19/20 0300 08/20/20 0256 08/21/20 0237  NA 136 136 135 134* 136  K 4.8 5.3* 4.9 4.3 4.7  CL 101 102 100 98 99  CO2 28 28 27 26 28   GLUCOSE 156* 149* 146* 148* 144*  BUN 19 16 17 15 19   CREATININE 0.43* 0.42* 0.44 0.40* 0.46  CALCIUM 7.9* 7.9* 8.0* 7.8* 8.1*   Estimated Creatinine Clearance: 73.3 mL/min (by C-G formula based on SCr of 0.46 mg/dL). Liver & Pancreas: Recent Labs  Lab 08/16/20 0308 08/17/20 0311 08/18/20 0313 08/20/20 0256 08/21/20 0237  AST 15 16 17 18 18   ALT 44 44 45* 44 52*  ALKPHOS 92 91 79 71 66  BILITOT 0.5 0.5 0.6 0.8 0.9  PROT 5.1* 4.9* 4.8* 5.0* 5.1*  ALBUMIN 2.3* 2.2* 2.3* 2.3* 2.5*   No results for input(s): LIPASE, AMYLASE in the last 168 hours. No results for input(s): AMMONIA in the last 168 hours. Diabetic: No results for input(s): HGBA1C in the last 72 hours. Recent Labs  Lab 08/15/20 1626  GLUCAP 205*   Cardiac Enzymes: No results for input(s): CKTOTAL, CKMB, CKMBINDEX, TROPONINI in the last 168 hours. No results for input(s): PROBNP in the last 8760 hours. Coagulation Profile: No results for input(s): INR, PROTIME in the last 168 hours. Thyroid Function Tests: No results for input(s): TSH, T4TOTAL, FREET4, T3FREE, THYROIDAB in the last 72  hours. Lipid Profile: No results for input(s): CHOL, HDL, LDLCALC, TRIG, CHOLHDL, LDLDIRECT in the last 72 hours. Anemia Panel: Recent Labs    08/20/20 0256 08/21/20 0237  FERRITIN 426* 417*   Urine analysis:    Component Value Date/Time   COLORURINE YELLOW 06/11/2017 0858   APPEARANCEUR CLEAR 06/11/2017 0858   LABSPEC 1.015 06/11/2017 0858   PHURINE 6.0 06/11/2017 0858   GLUCOSEU NEGATIVE 06/11/2017 0858   HGBUR NEGATIVE 06/11/2017 0858   BILIRUBINUR NEGATIVE 06/11/2017 0858   KETONESUR NEGATIVE 06/11/2017 0858   PROTEINUR NEGATIVE 06/11/2017 0858   UROBILINOGEN 0.2 08/20/2014 0814   NITRITE NEGATIVE 06/11/2017 0858   LEUKOCYTESUR NEGATIVE 06/11/2017 0858   Sepsis Labs: Invalid input(s): PROCALCITONIN, [place]  Microbiology: No results found for this or any previous visit (from the past 240 hour(s)).  Radiology Studies: No results found.    Timmie Calix T. [place]  If 7PM-7AM, please contact night-coverage [email or web address] 08/21/2020, 10:25 AM

## 2020-08-21 NOTE — Progress Notes (Signed)
Report called to Raelyn Number RN. All questions answered at this time. All pt belongings transferred with pt. Pt taken upstairs in the wheelchair on O2 and tele by RN and NT. 4W will continue to care for pt.

## 2020-08-21 NOTE — Evaluation (Signed)
Occupational Therapy Evaluation Patient Details Name: Sharon Kirk MRN: [id number] DOB: 24-Jun-1961 Today's Date: 08/21/2020    History of Present Illness 59 year old female with history of IBS, HLD and melanoma presenting with progressive dyspnea and admitted to ICU for acute hypoxemic respiratory failure due to COVID-19 infection, bilateral pleural effusion and severe hyponatremia to 109 on 08/10/2020.   Clinical Impression   Sharon Kirk is a 59 year old woman who presents with generalized weakness, decreased activity tolerance and balance with impaired cardiopulmonary status secondary to COVID and currently on 8 L HFNC. On evaluation patient predominantly limited to seated position for ADLs, mod assist for LB dressing and toileting and mod assist for sit to stand. Able to stand approx 15 seconds before needing sitting rest break and o2 sat dropped 80%. Patient educated on use of breathing device and mobility and exercises for recovery. Patient shown exercises to perform while in chair for arms and legs. Patient verbalized understanding. Patient will benefit from skilled OT services to improve activity tolerance and cardiopulmonary status  to reduce oxygenation needs in order for patient to return home at discharge. Hopeful patient will progress in order to go home with home health services.     Follow Up Recommendations  Home health OT;SNF;Supervision/Assistance - 24 hour    Equipment Recommendations  3 in 1 bedside commode    Recommendations for Other Services       Precautions / Restrictions Precautions Precautions: Fall Precaution Comments: monitor o2 sats Restrictions Weight Bearing Restrictions: No      Mobility Bed Mobility               General bed mobility comments: up in chair    Transfers Overall transfer level: Needs assistance Equipment used: Rolling walker (2 wheeled) Transfers: Sit to/from Stand Sit to Stand: Mod assist         General  transfer comment: MOd assist to stand from recliner surface. Use of back of chair as pseudo walker for standing. Able to tolerate standing approx 20 seconds with a sat drop to 80% on 8L HFNC. Hr up to 125.    Balance Overall balance assessment: Needs assistance Sitting-balance support: No upper extremity supported;Feet supported Sitting balance-Leahy Scale: Good     Standing balance support: During functional activity;Bilateral upper extremity supported Standing balance-Leahy Scale: Poor                             ADL either performed or assessed with clinical judgement   ADL Overall ADL's : Needs assistance/impaired Eating/Feeding: Independent   Grooming: Oral care;Wash/dry face;Sitting   Upper Body Bathing: Set up;Sitting   Lower Body Bathing: Set up;Sitting/lateral leans   Upper Body Dressing : Set up;Sitting   Lower Body Dressing: Set up;Sit to/from stand;Moderate assistance Lower Body Dressing Details (indicate cue type and reason): able to don socks in sitting. Needs assistance to pull clothing up due to upper extremities needed to provide support for standing. Toilet Transfer: Minimal assistance;BSC;Stand-pivot   Toileting- Clothing Manipulation and Hygiene: Sitting/lateral lean;Moderate assistance Toileting - Clothing Manipulation Details (indicate cue type and reason): assistance for clothing management             Vision Baseline Vision/History: Wears glasses Wears Glasses: At all times Patient Visual Report: No change from baseline       Perception     Praxis      Pertinent Vitals/Pain Pain Assessment: No/denies pain     Hand  Dominance Right   Extremity/Trunk Assessment Upper Extremity Assessment Upper Extremity Assessment: Generalized weakness (decreased shoulder strength 4-/5)   Lower Extremity Assessment Lower Extremity Assessment: Defer to PT evaluation (decreased hip strength)   Cervical / Trunk Assessment Cervical / Trunk  Assessment: Normal   Communication Communication Communication: No difficulties   Cognition Arousal/Alertness: Awake/alert Behavior During Therapy: WFL for tasks assessed/performed Overall Cognitive Status: Within Functional Limits for tasks assessed                                     General Comments       Exercises     Shoulder Instructions      Home Living Family/patient expects to be discharged to:: Private residence Living Arrangements: Spouse/significant other Available Help at Discharge: Family;Available PRN/intermittently Type of Home: House Home Access: Level entry     Home Layout: One level     Bathroom Shower/Tub: Teacher, early years/pre: Handicapped height     Home Equipment: None          Prior Functioning/Environment Level of Independence: Independent        Comments: works in Therapist, art        OT Problem List: Decreased strength;Impaired balance (sitting and/or standing);Decreased knowledge of use of DME or AE;Cardiopulmonary status limiting activity;Decreased activity tolerance      OT Treatment/Interventions: Self-care/ADL training;Therapeutic exercise;Energy conservation;Therapeutic activities;DME and/or AE instruction;Balance training;Patient/family education    OT Goals(Current goals can be found in the care plan section) Acute Rehab OT Goals Patient Stated Goal: to go home OT Goal Formulation: With patient Time For Goal Achievement: 09/04/20 Potential to Achieve Goals: Good  OT Frequency: Min 2X/week   Barriers to D/C:            Co-evaluation              AM-PAC OT "6 Clicks" Daily Activity     Outcome Measure Help from another person eating meals?: None Help from another person taking care of personal grooming?: A Little Help from another person toileting, which includes using toliet, bedpan, or urinal?: A Lot Help from another person bathing (including washing, rinsing, drying)?: A  Little Help from another person to put on and taking off regular upper body clothing?: A Little Help from another person to put on and taking off regular lower body clothing?: A Lot 6 Click Score: 17   End of Session Nurse Communication:  (okay to see per RN)  Activity Tolerance: Patient limited by fatigue Patient left: in chair;with call bell/phone within reach  OT Visit Diagnosis: Muscle weakness (generalized) (M62.81)                Time: 8264-1583 OT Time Calculation (min): 17 min Charges:  OT General Charges $OT Visit: 1 Visit OT Evaluation $OT Eval Moderate Complexity: 1 Mod  Zollie Ellery, OTR/L [place]  Office [phone] Pager: [phone]   Lenward Chancellor 08/21/2020, 4:11 PM

## 2020-08-22 LAB — FERRITIN: Ferritin: 382 ng/mL — ABNORMAL HIGH (ref 11–307)

## 2020-08-22 LAB — C-REACTIVE PROTEIN: CRP: 0.6 mg/dL (ref <1.0)

## 2020-08-22 LAB — D-DIMER, QUANTITATIVE: D-Dimer, Quant: 1.46 ug/mL-FEU — ABNORMAL HIGH (ref 0.00–0.50)

## 2020-08-22 MED ORDER — APIXABAN 5 MG PO TABS
5.0000 mg | ORAL_TABLET | Freq: Two times a day (BID) | ORAL | Status: DC
  Administered 2020-08-22 – 2020-08-24 (×4): 5 mg via ORAL
  Filled 2020-08-22: qty 1
  Filled 2020-08-22: qty 2
  Filled 2020-08-22 (×2): qty 1

## 2020-08-22 NOTE — Progress Notes (Signed)
PROGRESS NOTE  Sharon Kirk:[id number] DOB: June 05, 1961   PCP: Patient, No Pcp Per  Patient is from: Home  DOA: 08/10/2020 LOS: 12  Chief complaints: Shortness of breath  Brief Narrative / Interim history: 59 year old female with history of IBS, HLD and melanoma presenting with progressive dyspnea and admitted to ICU for acute hypoxemic respiratory failure due to COVID-19 infection, bilateral pleural effusion and severe hyponatremia to 109 on 08/10/2020.  Reportedly fully vaccinated including booster.  Tested positive for COVID-19 on 07/30/2020.  Received monoclonal antibody on 10/20.  CTA chest negative for PE but patchy diffuse infiltrates.  LE US revealed left posterior tibial DVT.  Started on anticoagulation.  She had right thoracocentesis with removal of 650 cc borderline exudative and culture negative fluid.  Eventually transferred to [facility] hospitalist service on 08/12/2020.  PCCM following.  Slowly improving but requiring 7 L by HFNC at rest and more with exertion.  She is currently off airborne precaution per PCCM and ID.  Subjective: Seen and examined earlier this morning.  No major events overnight of this morning.  No complaints.  She is currently saturating in upper 90s on several liters at rest.   Objective: Vitals:   08/21/20 2114 08/21/20 2345 08/22/20 0351 08/22/20 0500  BP:  121/82 119/80   Pulse:  72 74   Resp:  (!) 23 (!) 21   Temp:  98.3 F (36.8 C) 98 F (36.7 C)   TempSrc:      SpO2: 98% 96% 97%   Weight:    71.3 kg  Height:        Intake/Output Summary (Last 24 hours) at 08/22/2020 1217 Last data filed at 08/22/2020 0843 Gross per 24 hour  Intake 1280 ml  Output 2450 ml  Net -1170 ml   Filed Weights   08/20/20 0500 08/21/20 0500 08/22/20 0500  Weight: 72.8 kg 71.8 kg 71.3 kg    Examination:  GENERAL: No apparent distress.  Nontoxic. HEENT: MMM.  Vision and hearing grossly intact.  NECK: Supple.  No apparent JVD.  RESP: On 7 L by HFNC.  No  IWOB.  Bilateral crackles. CVS:  RRR. Heart sounds normal.  ABD/GI/GU: BS+. Abd soft, NTND.  MSK/EXT:  Moves extremities. No apparent deformity. No edema.  SKIN: no apparent skin lesion or wound NEURO: Awake, alert and oriented appropriately.  No apparent focal neuro deficit. PSYCH: Calm. Normal affect.  Procedures:  10/26-right thoracocentesis with removal of 650 cc borderline exudative and culture negative fluid  Microbiology summarized: BCx2 10/26 >> negative  U. Strep 10/26 >> negative  U. Legionella 10/26 >> negative  R Pleural Fluid Culture 10/26 >> negative   Assessment & Plan: Acute respiratory failure with hypoxia due to COVID-19 pneumonia: Reportedly fully vaccinated including booster.   Tested positive on 07/30/2020 at [facility]. Symptomatic for over 10 days days prior to admission.  CTA chest negative for PE but bilateral patchy infiltrates.  Also bilateral pleural effusion, Rt>Lt. oxygen requirement slowly improving.  Now down to 7 L by HFNC at rest but desaturates significantly with exertion Recent Labs    08/20/20 0256 08/21/20 0237 08/22/20 0551  DDIMER 1.90* 2.31* 1.46*  FERRITIN 426* 417* 382*  CRP 0.5 <0.5 0.6  -Status post monoclonal antibody infusion on 10/20. -Completed 5 days of remdesivir on 10/30. -On baricitinib and Solu-Medrol -Transition to Eliquis for LLE DVT. -Continue Protonix for GI prophylaxis. -Supportive care with inhalers, mucolytic/antitussive, vitamins, IS, OOB, PT, OT -Wean oxygen as able. -Off airborne precaution per PCCM  and ID, Dr. Lake Bells and Dr. Baxter Flattery  Possible acute cor pulmonale:  TTE on 10/26 with EF of 60 to 65%, McConnell sign and RVSP of 43.5 mmHg.  Could be due to the above.  Diuresed with IV Lasix  with excellent urine output.  Diuretics discontinued.  Appears euvolemic. -Lasix as needed intermittently. -Monitor fluid status  Hyponatremia: Na 109>>>136.  Resolved. -Continue monitoring  Acute left posterior tibial DVT:    -Transition to Eliquis  Hypokalemia: Resolved.  Debility/physical deconditioning -OOB/PT/OT   Body mass index is 26.16 kg/m.         DVT prophylaxis:  SCDs Start: 08/10/20 0651  Code Status: On therapeutic Lovenox for LLE DVT Family Communication: Updated patient's husband over the phone on 11/6.  Status is: Inpatient.   Remains inpatient appropriate because:IV treatments appropriate due to intensity of illness or inability to take PO, Inpatient level of care appropriate due to severity of illness and High oxygen requirement   Dispo: The patient is from: Home              Anticipated d/c is to: Home              Anticipated d/c date is: 3 days              Patient currently is not medically stable to d/c.  due to significant oxygen requirement   Consultants:  PCCM   Sch Meds:  Scheduled Meds: . baricitinib  4 mg Oral Daily  . Chlorhexidine Gluconate Cloth  6 each Topical Daily  . chlorpheniramine-HYDROcodone  5 mL Oral Q12H  . enoxaparin (LOVENOX) injection  70 mg Subcutaneous Q12H  . guaiFENesin  1,200 mg Oral BID  . Ipratropium-Albuterol  1 puff Inhalation TID  . mouth rinse  15 mL Mouth Rinse BID  . methylPREDNISolone (SOLU-MEDROL) injection  40 mg Intravenous Q12H  . pantoprazole  40 mg Oral Daily  . sodium chloride flush  10 mL Intravenous Q12H   Continuous Infusions: PRN Meds:.acetaminophen, albuterol, docusate sodium, guaiFENesin-dextromethorphan, ondansetron (ZOFRAN) IV, polyethylene glycol, saline, sodium chloride  Antimicrobials: Anti-infectives (From admission, onward)   Start     Dose/Rate Route Frequency Ordered Stop   08/11/20 1000  remdesivir 100 mg in sodium chloride 0.9 % 100 mL IVPB       "Followed by" Linked Group Details   100 mg 200 mL/hr over 30 Minutes Intravenous Daily 08/10/20 0719 08/14/20 1104   08/10/20 0800  cefTRIAXone (ROCEPHIN) 2 g in sodium chloride 0.9 % 100 mL IVPB  Status:  Discontinued        2 g 200 mL/hr over 30  Minutes Intravenous Every 24 hours 08/10/20 0703 08/11/20 1056   08/10/20 0800  azithromycin (ZITHROMAX) 500 mg in sodium chloride 0.9 % 250 mL IVPB  Status:  Discontinued        500 mg 250 mL/hr over 60 Minutes Intravenous Every 24 hours 08/10/20 0703 08/11/20 1056   08/10/20 0730  remdesivir 200 mg in sodium chloride 0.9% 250 mL IVPB       "Followed by" Linked Group Details   200 mg 580 mL/hr over 30 Minutes Intravenous Once 08/10/20 0719 08/10/20 0953       I have personally reviewed the following labs and images: CBC: Recent Labs  Lab 08/18/20 0313 08/19/20 0300 08/20/20 0256 08/21/20 0237  WBC 14.4* 13.4* 13.5* 13.3*  NEUTROABS  --   --  12.6* 12.3*  HGB 12.6 12.2 12.3 12.9  HCT 38.9 37.4 38.4 40.4  MCV 89.0 87.8 88.3 89.0  PLT 398 378 387 411*   BMP &GFR Recent Labs  Lab 08/17/20 0311 08/18/20 0313 08/19/20 0300 08/20/20 0256 08/21/20 0237  NA 136 136 135 134* 136  K 4.8 5.3* 4.9 4.3 4.7  CL 101 102 100 98 99  CO2 28 28 27 26 28   GLUCOSE 156* 149* 146* 148* 144*  BUN 19 16 17 15 19   CREATININE 0.43* 0.42* 0.44 0.40* 0.46  CALCIUM 7.9* 7.9* 8.0* 7.8* 8.1*   Estimated Creatinine Clearance: 73.1 mL/min (by C-G formula based on SCr of 0.46 mg/dL). Liver & Pancreas: Recent Labs  Lab 08/16/20 0308 08/17/20 0311 08/18/20 0313 08/20/20 0256 08/21/20 0237  AST 15 16 17 18 18   ALT 44 44 45* 44 52*  ALKPHOS 92 91 79 71 66  BILITOT 0.5 0.5 0.6 0.8 0.9  PROT 5.1* 4.9* 4.8* 5.0* 5.1*  ALBUMIN 2.3* 2.2* 2.3* 2.3* 2.5*   No results for input(s): LIPASE, AMYLASE in the last 168 hours. No results for input(s): AMMONIA in the last 168 hours. Diabetic: No results for input(s): HGBA1C in the last 72 hours. Recent Labs  Lab 08/15/20 1626  GLUCAP 205*   Cardiac Enzymes: No results for input(s): CKTOTAL, CKMB, CKMBINDEX, TROPONINI in the last 168 hours. No results for input(s): PROBNP in the last 8760 hours. Coagulation Profile: No results for input(s): INR,  PROTIME in the last 168 hours. Thyroid Function Tests: No results for input(s): TSH, T4TOTAL, FREET4, T3FREE, THYROIDAB in the last 72 hours. Lipid Profile: No results for input(s): CHOL, HDL, LDLCALC, TRIG, CHOLHDL, LDLDIRECT in the last 72 hours. Anemia Panel: Recent Labs    08/21/20 0237 08/22/20 0551  FERRITIN 417* 382*   Urine analysis:    Component Value Date/Time   COLORURINE YELLOW 06/11/2017 0858   APPEARANCEUR CLEAR 06/11/2017 0858   LABSPEC 1.015 06/11/2017 0858   PHURINE 6.0 06/11/2017 0858   GLUCOSEU NEGATIVE 06/11/2017 0858   HGBUR NEGATIVE 06/11/2017 0858   BILIRUBINUR NEGATIVE 06/11/2017 0858   KETONESUR NEGATIVE 06/11/2017 0858   PROTEINUR NEGATIVE 06/11/2017 0858   UROBILINOGEN 0.2 08/20/2014 0814   NITRITE NEGATIVE 06/11/2017 0858   LEUKOCYTESUR NEGATIVE 06/11/2017 0858   Sepsis Labs: Invalid input(s): PROCALCITONIN, [place]  Microbiology: No results found for this or any previous visit (from the past 240 hour(s)).  Radiology Studies: No results found.    Venesa Semidey T. Leonard  If 7PM-7AM, please contact night-coverage [email or web address] 08/22/2020, 12:17 PM

## 2020-08-22 NOTE — Discharge Instructions (Signed)
Information on my medicine - ELIQUIS (apixaban)  This medication education was reviewed with me or my healthcare representative as part of my discharge preparation.  The pharmacist that spoke with me during my hospital stay was:   Why was Eliquis prescribed for you? Eliquis was prescribed to treat blood clots that may have been found in the veins of your legs (deep vein thrombosis) or in your lungs (pulmonary embolism) and to reduce the risk of them occurring again.  What do You need to know about Eliquis ? Starting dose unnecessary due to completing > 10 days of therapeutic Lovenox, hence the dose is reduced to ONE 5 mg tablet taken TWICE daily.  Eliquis may be taken with or without food.   Try to take the dose about the same time in the morning and in the evening. If you have difficulty swallowing the tablet whole please discuss with your pharmacist how to take the medication safely.  Take Eliquis exactly as prescribed and DO NOT stop taking Eliquis without talking to the doctor who prescribed the medication.  Stopping may increase your risk of developing a new blood clot.  Refill your prescription before you run out.  After discharge, you should have regular check-up appointments with your healthcare provider that is prescribing your Eliquis.    What do you do if you miss a dose? If a dose of ELIQUIS is not taken at the scheduled time, take it as soon as possible on the same day and twice-daily administration should be resumed. The dose should not be doubled to make up for a missed dose.  Important Safety Information A possible side effect of Eliquis is bleeding. You should call your healthcare provider right away if you experience any of the following: ? Bleeding from an injury or your nose that does not stop. ? Unusual colored urine (red or dark brown) or unusual colored stools (red or black). ? Unusual bruising for unknown reasons. ? A serious fall or if you hit your head  (even if there is no bleeding).  Some medicines may interact with Eliquis and might increase your risk of bleeding or clotting while on Eliquis. To help avoid this, consult your healthcare provider or pharmacist prior to using any new prescription or non-prescription medications, including herbals, vitamins, non-steroidal anti-inflammatory drugs (NSAIDs) and supplements.  This website has more information on Eliquis (apixaban): http://[email or web address].com/eliquis/home

## 2020-08-22 NOTE — Progress Notes (Signed)
[place] for Apixaban Indication: DVT  Allergies  Allergen Reactions  . Codeine Nausea Only and Other (See Comments)    Hallucinations; confusion    Patient Measurements: Height: 5\' 5"  (165.1 cm) Weight: 71.3 kg (157 lb 3 oz) IBW/kg (Calculated) : 57  Vital Signs: Temp: 98 F (36.7 C) (11/07 0351) BP: 119/80 (11/07 0351) Pulse Rate: 74 (11/07 0351)  Labs: Recent Labs    08/20/20 0256 08/21/20 0237  HGB 12.3 12.9  HCT 38.4 40.4  PLT 387 411*  CREATININE 0.40* 0.46    Estimated Creatinine Clearance: 73.1 mL/min (by C-G formula based on SCr of 0.46 mg/dL).   Medical History: Past Medical History:  Diagnosis Date  . Allergy   . Cancer ([place])    Melanoma  . Hyperlipidemia   . IBS (irritable bowel syndrome)   . Osteopenia   . PONV (postoperative nausea and vomiting)   . Thyroid nodule     Assessment: 59 y/o F with COVID PNA on Lovenox for DVT. Pharmacy consulted for apixaban. Patient had 11 days of therapeutic Lovenox.  Plan:  Will bypass loading phase as patient completed > 10 days of therapeutic Lovenox. Will order apixaban 5 mg bid from tonight with last Lovenox dose this AM. Will sign-off and monitor remotely. Thank you for the consult.   Ulice Dash D 08/22/2020,12:49 PM

## 2020-08-22 NOTE — Evaluation (Signed)
Physical Therapy Evaluation Patient Details Name: Sharon Kirk MRN: [id number] DOB: August 09, 1961 Today's Date: 08/22/2020   History of Present Illness  59 year old female with history of IBS, HLD and melanoma presenting with progressive dyspnea and admitted to ICU for acute hypoxemic respiratory failure due to COVID-19 infection, bilateral pleural effusion and severe hyponatremia to 109 on 08/10/2020. Now off of airborne precautions  Clinical Impression  The patient is resting in recliner with SPO2 94% on 5 L, HR 95. Patient requiring mod assistance to power up to stand. Patient Did ambulate x 6 ' forward and then backward  x 2 with rest breaks and practiced standing up 2 more times. SPO2 dropping to 81% and HR 125.  Gradually  improved SPO2 back to 91% with IS and pursed lip breaths over 2 minutes.   Patient reports that her spouse may be home at her DC. Currently patient is very decondiditoned. Pt admitted with above diagnosis.  Pt currently with functional limitations due to the deficits listed below (see PT Problem List). Pt will benefit from skilled PT to increase their independence and safety with mobility to allow discharge to the venue listed below.       Follow Up Recommendations Home health PT;Supervision/Assistance - 24 hour vs SNF if does not have 24/7. Marland Kitchen     Equipment Recommendations  Rolling walker with 5" wheels    Recommendations for Other Services       Precautions / Restrictions Precautions Precautions: Fall Precaution Comments: monitor o2 sats      Mobility  Bed Mobility               General bed mobility comments: up in chair    Transfers Overall transfer level: Needs assistance Equipment used: Rolling walker (2 wheeled) Transfers: Sit to/from Stand Sit to Stand: Mod assist         General transfer comment: mod assist from recliner x 4, did improve with pushing up from recliner armrests.  Ambulation/Gait Ambulation/Gait assistance: Min assist;+2  safety/equipment Gait Distance (Feet): 6 Feet (forward and backward x 2 with seated rest break between) Assistive device: Rolling walker (2 wheeled)   Gait velocity: decreased   General Gait Details: ambulated forward x 6' then back wards x 2, seated rest break between  the walks. SPO2 dropping to 81% on 6 L.  HR 123 max Encouraged pursed lip breaths. H  Stairs            Wheelchair Mobility    Modified Rankin (Stroke Patients Only)       Balance Overall balance assessment: Needs assistance Sitting-balance support: No upper extremity supported;Feet supported Sitting balance-Leahy Scale: Good     Standing balance support: During functional activity;Bilateral upper extremity supported Standing balance-Leahy Scale: Poor Standing balance comment: reliant on UE's                             Pertinent Vitals/Pain Pain Assessment: No/denies pain    Home Living Family/patient expects to be discharged to:: Private residence Living Arrangements: Spouse/significant other Available Help at Discharge: Family;Available PRN/intermittently Type of Home: House Home Access: Level entry     Home Layout: One level Home Equipment: None      Prior Function Level of Independence: Independent         Comments: works in Therapist, 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, 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     Engineer, manufacturing Dominance   Dominant Hand: Right    Extremity/Trunk Assessment   Upper  Extremity Assessment Upper Extremity Assessment: Generalized weakness    Lower Extremity Assessment Lower Extremity Assessment: Generalized weakness    Cervical / Trunk Assessment Cervical / Trunk Assessment: Normal  Communication   Communication: No difficulties  Cognition Arousal/Alertness: Awake/alert Behavior During Therapy: WFL for tasks assessed/performed Overall Cognitive Status: Within Functional Limits for tasks assessed                                 General Comments: slightly slow  to  problem solve      General Comments      Exercises General Exercises - Upper Extremity Shoulder Extension: AROM;Strengthening;Both;10 reps;Seated;Theraband Shoulder ABduction: AROM;Seated;Both;10 reps;Theraband Shoulder Horizontal ABduction: AROM;Seated;Both;10 reps;Theraband Elbow Extension: AROM;Both;10 reps;Seated;Theraband General Exercises - Lower Extremity Long Arc Quad: AROM;Seated;Both;10 reps Hip Flexion/Marching: AROM;Seated;Both;10 reps Other Exercises Other Exercises: IS x 20   Assessment/Plan    PT Assessment Patient needs continued PT services  PT Problem List Decreased strength;Decreased knowledge of use of DME;Decreased activity tolerance;Decreased balance;Decreased knowledge of precautions;Decreased mobility;Cardiopulmonary status limiting activity       PT Treatment Interventions DME instruction;Gait training;Functional mobility training;Therapeutic activities;Therapeutic exercise;Patient/family education    PT Goals (Current goals can be found in the Care Plan section)  Acute Rehab PT Goals Patient Stated Goal: to go home PT Goal Formulation: With patient Time For Goal Achievement: 09/05/20 Potential to Achieve Goals: Good    Frequency Min 3X/week   Barriers to discharge        Co-evaluation               AM-PAC PT "6 Clicks" Mobility  Outcome Measure Help needed turning from your back to your side while in a flat bed without using bedrails?: A Little Help needed moving from lying on your back to sitting on the side of a flat bed without using bedrails?: A Little Help needed moving to and from a bed to a chair (including a wheelchair)?: A Lot Help needed standing up from a chair using your arms (e.g., wheelchair or bedside chair)?: A Lot Help needed to walk in hospital room?: A Lot Help needed climbing 3-5 steps with a railing? : Total 6 Click Score: 13    End of Session Equipment Utilized During Treatment: Gait belt;Oxygen Activity  Tolerance: Patient tolerated treatment well Patient left: in chair;with call bell/phone within reach Nurse Communication: Mobility status PT Visit Diagnosis: Unsteadiness on feet (R26.81);Difficulty in walking, not elsewhere classified (R26.2)    Time: 1432-1510 PT Time Calculation (min) (ACUTE ONLY): 38 min   Charges:   PT Evaluation $PT Eval Low Complexity: 1 Low PT Treatments $Gait Training: 8-22 mins $Therapeutic Exercise: 8-22 mins        Tresa Endo PT Acute Rehabilitation Services Pager [phone] Office [phone]   Claretha Cooper 08/22/2020, 3:22 PM

## 2020-08-23 LAB — D-DIMER, QUANTITATIVE: D-Dimer, Quant: 1.29 ug/mL-FEU — ABNORMAL HIGH (ref 0.00–0.50)

## 2020-08-23 LAB — C-REACTIVE PROTEIN: CRP: <0.5 mg/dL (ref <1.0)

## 2020-08-23 LAB — FERRITIN: Ferritin: 349 ng/mL — ABNORMAL HIGH (ref 11–307)

## 2020-08-23 NOTE — Plan of Care (Signed)

## 2020-08-23 NOTE — Progress Notes (Signed)
Physical Therapy Treatment Patient Details Name: Sharon Kirk MRN: [id number] DOB: 04/05/1961 Today's Date: 08/23/2020    History of Present Illness 59 year old female with history of IBS, HLD and melanoma presenting with progressive dyspnea and admitted to ICU for acute hypoxemic respiratory failure due to COVID-19 infection, bilateral pleural effusion and severe hyponatremia to 109 on 08/10/2020. Now off of airborne    PT Comments    Pt found in bed, eager for PT. Very cooperative. Before beginning therapy, HR-117, O2-96 on 3L of oxygen. Physically, she is supervision with getting to EOB. Oxygen lines were managed for her. She is min G to stand for safety. Pt ambulates in a heel-to-toe fashion, one foot directly in front of the other, however, no LOB. This is addressed with VCs to widen BOS; BOS improves slightly. Min G with gait for safety. During ambulation exercise, HR-118 O2-76 on 3L of oxygen. Pt requires sitting rest break and oxygen turned up to 6L to get pt back to a healthy value. Once sitting, recovery is quick; first break: HR-96 O2-91 on 6L of O2, second break: HR-88 O2-90 on 4L of O2. O2 is turned down to 4L for remainder of ambulation exercise. Pt never has c/o feeling dizzy, lightheaded, or having a headache; willing to continue at all times during session. Back in bed, vitals quickly recover HR-87 O2-91 4L. O2 in room is still set to 3L.  Follow Up Recommendations        Equipment Recommendations       Recommendations for Other Services       Precautions / Restrictions Precautions Precautions: Fall Precaution Comments: monitor o2 sats Restrictions Weight Bearing Restrictions: No    Mobility  Bed Mobility Overal bed mobility: Modified Independent Bed Mobility: Supine to Sit;Sit to Supine     Supine to sit: Supervision Sit to supine: Supervision      Transfers Overall transfer level: Modified independent Equipment used: 4-wheeled walker Transfers: Sit  to/from [facility] Sit to Stand: Min guard Stand pivot transfers: Min guard          Ambulation/Gait Ambulation/Gait assistance: +2 safety/equipment;Min assist Gait Distance (Feet): 100 Feet (2 sitting rest breaks) Assistive device: 4-wheeled walker Gait Pattern/deviations: Narrow base of support;Decreased stride length Gait velocity: decreased   General Gait Details: narrow BOS; walks in a heel-to-toe fashion, R foot directly in front of the L during foot flat of gait cycle   Stairs             Wheelchair Mobility    Modified Rankin (Stroke Patients Only)       Balance                                            Cognition Arousal/Alertness: Awake/alert Behavior During Therapy: WFL for tasks assessed/performed Overall Cognitive Status: Within Functional Limits for tasks assessed                                        Exercises      General Comments        Pertinent Vitals/Pain Pain Assessment: No/denies pain    Home Living                      Prior Function  PT Goals (current goals can now be found in the care plan section) Acute Rehab PT Goals Patient Stated Goal: to go home PT Goal Formulation: With patient Time For Goal Achievement: 09/05/20 Potential to Achieve Goals: Good Progress towards PT goals: Progressing toward goals    Frequency           PT Plan      Co-evaluation              AM-PAC PT "6 Clicks" Mobility   Outcome Measure                   End of Session Equipment Utilized During Treatment: Gait belt;Oxygen Activity Tolerance: Patient tolerated treatment well Patient left: in bed;with call bell/phone within reach;with bed alarm set Nurse Communication: Mobility status;Other (comment) (pt prefers 4W walker)       Time: 6754-4920 PT Time Calculation (min) (ACUTE ONLY): 33 min  Charges:  $Gait Training: 8-22 mins $Therapeutic  Activity: 8-22 mins                     C. Parks [place]une, [place] [facility] [phone]

## 2020-08-23 NOTE — Progress Notes (Signed)
PROGRESS NOTE  Sharon Kirk GEX:[id number] DOB: Mar 08, 1961 DOA: 08/10/2020 PCP: Patient, No Pcp Per   LOS: 13 days   Brief narrative: As per HPI,  59 year old female with history of IBS, HLD and melanoma presenting with progressive dyspnea and admitted to ICU for acute hypoxemic respiratory failure due to COVID-19 infection, bilateral pleural effusion and severe hyponatremia to 109 on 08/10/2020.  Reportedly fully vaccinated including booster.  Tested positive for COVID-19 on 07/30/2020.  Received monoclonal antibody on 10/20.  CTA chest negative for PE but patchy diffuse infiltrates.  LE US revealed left posterior tibial DVT.  Started on anticoagulation.  She had right thoracocentesis with removal of 650 cc borderline exudative and culture negative fluid.  Eventually patient was transferred to [facility] on 08/12/2020. PCCM for the patient during hospitalization. Slowly improving and now on 3 L by HFNC at rest and more with exertion.  She is currently off airborne precautions per PCCM and ID.  Assessment/Plan:  Principal Problem:   Pneumonia due to COVID-19 virus Active Problems:   S/P thoracentesis   Pleural effusion, right   Hyponatremia   ARDS (adult respiratory distress syndrome) (HCC)   DVT (deep venous thrombosis) (HCC)   Pulmonary hypertension (HCC)  Acute respiratory failure with hypoxia due to COVID-19 pneumonia: Patient had been fully vaccinated previously.  Covid positive on 07/30/2020 at [facility]. Symptomatic for over 10 days days prior to admission.  CTA chest negative for PE but bilateral patchy infiltrates, bilateral pleural effusion, Rt>Lt. patient had a prolonged stay in the hospital initially requiring high flow oxygen.  Has been gradually weaning at this time.  Status post monoclonal antibody infusion on 10/20. Completed 5 days of remdesivir on 10/30.  Currently on baricitinib and Solu-Medrol Patient has been changed to Eliquis for LLE DVT. Continue  Protonix for GI prophylaxis. Continue supportive care.  Continue to wean oxygen as able.  Patient has been off airborne precautions at this time.   Possible acute cor pulmonale:  Patient responded well with IV Lasix.  Diuretics have been discontinued at this time.  Euvolemic currently.  Might need Lasix intermittently.  TTE on 10/26 with EF of 60 to 65%, McConnell sign and RVSP of 43.5 mmHg.  Hyponatremia:Has resolved at this time.  Latest sodium of 136.  Acute left posterior tibial DVT: We will continue Eliquis on discharge.  Hypokalemia: Resolved.  Latest potassium of 4.7.  Debility/physical deconditioning Physical therapy on board.  Recommend home PT on discharge.  DVT prophylaxis: SCDs Start: 08/10/20 0651 apixaban (ELIQUIS) tablet 5 mg    Code Status: Full code  Family Communication: I tried to reach the patient's husband Mr Rodney Cruise on the phone but was unable to reach him. Left a voice message.  Status is: Inpatient  Remains inpatient appropriate because:IV treatments appropriate due to intensity of illness or inability to take PO and Inpatient level of care appropriate due to severity of illness   Dispo: The patient is from: Home              Anticipated d/c is to: Home with home health              Anticipated d/c date is: 1 to 2 days.              Patient currently is not medically stable to d/c.  Consultants:  PCCM  Procedures: Right-sided thoracentesis with removal of 650 mL of fluid on 08/10/2020.  Antibiotics:  . None  Anti-infectives (From admission, onward)  Start     Dose/Rate Route Frequency Ordered Stop   08/11/20 1000  remdesivir 100 mg in sodium chloride 0.9 % 100 mL IVPB       "Followed by" Linked Group Details   100 mg 200 mL/hr over 30 Minutes Intravenous Daily 08/10/20 0719 08/14/20 1104   08/10/20 0800  cefTRIAXone (ROCEPHIN) 2 g in sodium chloride 0.9 % 100 mL IVPB  Status:  Discontinued        2 g 200 mL/hr over 30 Minutes  Intravenous Every 24 hours 08/10/20 0703 08/11/20 1056   08/10/20 0800  azithromycin (ZITHROMAX) 500 mg in sodium chloride 0.9 % 250 mL IVPB  Status:  Discontinued        500 mg 250 mL/hr over 60 Minutes Intravenous Every 24 hours 08/10/20 0703 08/11/20 1056   08/10/20 0730  remdesivir 200 mg in sodium chloride 0.9% 250 mL IVPB       "Followed by" Linked Group Details   200 mg 580 mL/hr over 30 Minutes Intravenous Once 08/10/20 0719 08/10/20 0953     Subjective: Today, patient was seen and examined at bedside.  Patient denies any fever, chills or rigor.  Feels better than before. No chest pain, sputum production.    Objective: Vitals:   08/22/20 2207 08/23/20 0627  BP: 131/75 117/76  Pulse: 77 71  Resp: 20 20  Temp: (!) 97.5 F (36.4 C) 97.7 F (36.5 C)  SpO2: 91% 94%    Intake/Output Summary (Last 24 hours) at 08/23/2020 0905 Last data filed at 08/23/2020 0845 Gross per 24 hour  Intake 600 ml  Output --  Net 600 ml   Filed Weights   08/20/20 0500 08/21/20 0500 08/22/20 0500  Weight: 72.8 kg 71.8 kg 71.3 kg   Body mass index is 26.16 kg/m.   Physical Exam: GENERAL: Patient is alert awake and oriented. Not in obvious distress.  On nasal cannula oxygen at 3 L/min. HENT: No scleral pallor or icterus. Pupils equally reactive to light. Oral mucosa is moist NECK: is supple, no gross swelling noted. CHEST:   Diminished breath sounds bilaterally. CVS: S1 and S2 heard, no murmur. Regular rate and rhythm.  ABDOMEN: Soft, non-tender, bowel sounds are present. EXTREMITIES: No edema. CNS: Cranial nerves are intact. No focal motor deficits. SKIN: warm and dry without rashes.  Data Review: I have personally reviewed the following laboratory data and studies,  CBC: Recent Labs  Lab 08/18/20 0313 08/19/20 0300 08/20/20 0256 08/21/20 0237  WBC 14.4* 13.4* 13.5* 13.3*  NEUTROABS  --   --  12.6* 12.3*  HGB 12.6 12.2 12.3 12.9  HCT 38.9 37.4 38.4 40.4  MCV 89.0 87.8 88.3 89.0   PLT 398 378 387 664*   Basic Metabolic Panel: Recent Labs  Lab 08/17/20 0311 08/18/20 0313 08/19/20 0300 08/20/20 0256 08/21/20 0237  NA 136 136 135 134* 136  K 4.8 5.3* 4.9 4.3 4.7  CL 101 102 100 98 99  CO2 28 28 27 26 28   GLUCOSE 156* 149* 146* 148* 144*  BUN 19 16 17 15 19   CREATININE 0.43* 0.42* 0.44 0.40* 0.46  CALCIUM 7.9* 7.9* 8.0* 7.8* 8.1*   Liver Function Tests: Recent Labs  Lab 08/17/20 0311 08/18/20 0313 08/20/20 0256 08/21/20 0237  AST 16 17 18 18   ALT 44 45* 44 52*  ALKPHOS 91 79 71 66  BILITOT 0.5 0.6 0.8 0.9  PROT 4.9* 4.8* 5.0* 5.1*  ALBUMIN 2.2* 2.3* 2.3* 2.5*   No results for input(s):  LIPASE, AMYLASE in the last 168 hours. No results for input(s): AMMONIA in the last 168 hours. Cardiac Enzymes: No results for input(s): CKTOTAL, CKMB, CKMBINDEX, TROPONINI in the last 168 hours. BNP (last 3 results) Recent Labs    08/10/20 0952  BNP 859.5*    ProBNP (last 3 results) No results for input(s): PROBNP in the last 8760 hours.  CBG: No results for input(s): GLUCAP in the last 168 hours. No results found for this or any previous visit (from the past 240 hour(s)).   Studies: No results found.    Flora Lipps, MD  [facility] 08/23/2020

## 2020-08-24 LAB — FERRITIN: Ferritin: 334 ng/mL — ABNORMAL HIGH (ref 11–307)

## 2020-08-24 LAB — D-DIMER, QUANTITATIVE: D-Dimer, Quant: 1.57 ug/mL-FEU — ABNORMAL HIGH (ref 0.00–0.50)

## 2020-08-24 LAB — C-REACTIVE PROTEIN: CRP: 0.6 mg/dL (ref <1.0)

## 2020-08-24 MED ORDER — ALBUTEROL SULFATE HFA 108 (90 BASE) MCG/ACT IN AERS: 1.0000 | INHALATION_SPRAY | RESPIRATORY_TRACT | 2 refills | Status: DC | PRN

## 2020-08-24 MED ORDER — APIXABAN 5 MG PO TABS
5.0000 mg | ORAL_TABLET | Freq: Two times a day (BID) | ORAL | 2 refills | Status: DC
Start: 2020-08-24 — End: 2021-10-04

## 2020-08-24 MED ORDER — HYDROCOD POLST-CPM POLST ER 10-8 MG/5ML PO SUER: 5.0000 mL | Freq: Two times a day (BID) | ORAL | 0 refills | Status: DC

## 2020-08-24 MED ORDER — POLYETHYLENE GLYCOL 3350 17 G PO PACK: 17.0000 g | PACK | Freq: Every day | ORAL | 0 refills | Status: DC | PRN

## 2020-08-24 NOTE — Discharge Summary (Addendum)
Physician Discharge Summary  Sharon Kirk AST:[id number] DOB: [birth year] DOA: 08/10/2020  PCP: Patient, No Pcp Per  Admit date: 08/10/2020 Discharge date: 08/24/2020  Admitted From: Home  Discharge disposition: Home with [facility]  Recommendations for Outpatient Follow-Up:   . Follow up with your primary care provider in one week.  . Check CBC, BMP, magnesium in the next visit . Patient to be reassessed for the need of oxygen in the next visit.   . Patient has been started on Eliquis for DVT which need to be continued for at least 3 to 6 months. . Please consider outpatient chest x-ray in 4 to 6 weeks.  Patient did require some IV Lasix during hospitalization, please reassess the need for diuretics in the future. . Patient would also benefit from 2D echocardiogram as outpatient in 3 to 6 months to assess for pulmonary hypertension.   Discharge Diagnosis:   Principal Problem:   Pneumonia due to COVID-19 virus Active Problems:   S/P thoracentesis   Pleural effusion, right   Hyponatremia   ARDS (adult respiratory distress syndrome) (HCC)   DVT (deep venous thrombosis) ([place])   Pulmonary hypertension ([place])   Discharge Condition: Improved.  Diet recommendation: regular.   Wound care: None.  Code status: Full.   History of Present Illness:  59 year old female with history of IBS, HLD and melanoma presenting with progressive dyspnea and admitted to ICU for acute hypoxemic respiratory failure due to COVID-19 infection, bilateral pleural effusion and severe hyponatremia to 109 on 08/10/2020. Reportedly fully vaccinated including booster. Tested positive for COVID-19 on 07/30/2020. Received monoclonal antibody on 10/20. CTA chest negative for PE but patchy diffuse infiltrates. LE US revealed left posterior tibial DVT. Patient was started on anticoagulation. She had right thoracocentesis with removal of 650 cc borderline exudative and culture negative fluid. Eventually, patient  was transferred [facility] on 08/12/2020.  Pulmonary followed the patient during hospitalization. Patient did slowly improve and was weaned down to 3 L of oxygen.She was subsequently taken off airborne precautions per pulmonary and infectious disease after she completed at 21 days since positivity.Marland Kitchen  Hospital Course:   Following conditions were addressed during hospitalization as listed below,  Acute respiratory failure with hypoxia due to COVID-19 pneumonia: Patient had been fully vaccinated previously.    Patient tested Covid positive on 10/15/2021at [facility]. Symptomatic for over 10 days days prior to admission. CTA chest negative for PE but bilateral patchy infiltrates, bilateral pleural effusion, Rt>Lt. patient had a prolonged stay in the hospital initially requiring high flow oxygen.    Patient was gradually weaned off the oxygen.  Completed 5 days of remdesivir on 10/30.    Patient was continued on baricitinib and Solu-Medrol during hospitalization.  At this time patient has been weaned down to 3 L of oxygen by nasal cannula.  Possible acute cor pulmonale:  On presentation.  Patient responded well with IV Lasix.  Diuretics have been discontinued at this time.  Euvolemic currently.   TTE on 10/26 with EF of 60 to 65%, McConnell sign and RVSP of 43.5 mmHg.  Patient will need to be monitored as outpatient.  Hyponatremia:Has resolved at this time.  Latest sodium of 136.  Acute left posterior tibial DVT: We will continue Eliquis on discharge.  Hypokalemia: Resolved.  Latest potassium of 4.7.  Debility/physical deconditioning Physical therapy assessed the patient during hospitalization. Recommended home PT on discharge.  This will be arranged on discharge.  Disposition.  At this time, patient is stable for  disposition with home health and supplemental oxygen.  I spoke with the patient's spouse on the phone regarding the plan for disposition and  follow-up.  Medical Consultants:    Pulmonary  Procedures:    Right-sided thoracentesis with removal of 650 mL of fluid on 08/10/2020. Subjective:   Today, patient was seen and examined at bedside.  Patient continues to feel better with breathing.  Denies any fever, chest pain, chills, rigors.  Discharge Exam:   Vitals:   08/23/20 2234 08/24/20 0611  BP: 98/62 101/68  Pulse: 73 71  Resp:  18  Temp: 98.2 F (36.8 C) 98 F (36.7 C)  SpO2: 97% 97%   Vitals:   08/23/20 0627 08/23/20 1259 08/23/20 2234 08/24/20 0611  BP: 117/76 114/73 98/62 101/68  Pulse: 71 78 73 71  Resp: 20 18  18   Temp: 97.7 F (36.5 C) 97.6 F (36.4 C) 98.2 F (36.8 C) 98 F (36.7 C)  TempSrc: Oral Oral Oral Oral  SpO2: 94% 100% 97% 97%  Weight:    66 kg  Height:        General: Alert awake, not in obvious distress, on supplemental oxygen by nasal cannula. HENT: pupils equally reacting to light,  No scleral pallor or icterus noted. Oral mucosa is moist.  Chest:  Clear breath sounds.  Diminished breath sounds bilaterally.  CVS: S1 &S2 heard. No murmur.  Regular rate and rhythm. Abdomen: Soft, nontender, nondistended.  Bowel sounds are heard.   Extremities: No cyanosis, clubbing or edema.  Peripheral pulses are palpable. Psych: Alert, awake and oriented, normal mood CNS:  No cranial nerve deficits.  Power equal in all extremities.   Skin: Warm and dry.  No rashes noted.  The results of significant diagnostics from this hospitalization (including imaging, microbiology, ancillary and laboratory) are listed below for reference.     Diagnostic Studies:   CT Angio Chest PE W/Cm &/Or Wo Cm  Result Date: 08/10/2020 CLINICAL DATA:  COVID positive. Shortness of breath. Elevated D-dimer. Pulmonary embolus suspected. EXAM: CT ANGIOGRAPHY CHEST WITH CONTRAST TECHNIQUE: Multidetector CT imaging of the chest was performed using the standard protocol during bolus administration of intravenous contrast.  Multiplanar CT image reconstructions and MIPs were obtained to evaluate the vascular anatomy. CONTRAST:  146mL OMNIPAQUE IOHEXOL 350 MG/ML SOLN COMPARISON:  None. FINDINGS: Cardiovascular: Heart size upper normal. No substantial pericardial effusion. No thoracic aortic aneurysm. No large central pulmonary embolus. No evidence for filling defect in segmental or subsegmental pulmonary arteries although given the substantial motion degradation on the current exam, assessment is unreliable, especially in the lower lobes. Mediastinum/Nodes: 2.5 cm left thyroid nodule evident. Scattered upper normal mediastinal lymph nodes evident. The esophagus has normal imaging features. There is no axillary lymphadenopathy. Lungs/Pleura: Diffuse areas of patchy ground-glass opacity are noted in the lungs bilaterally with superimposed areas of confluent consolidative lung disease, most prominently in the right upper lobe, lingula, and right lower lobe. Moderate right and small left pleural effusions associated. Upper Abdomen: Unremarkable. Musculoskeletal: No worrisome lytic or sclerotic osseous abnormality. Diffuse body wall edema evident. Review of the MIP images confirms the above findings. IMPRESSION: 1. No CT evidence for large central pulmonary embolus. Assessment of segmental or subsegmental pulmonary arteries to the lower lobes is unreliable due to substantial motion degradation. 2. Diffuse areas of patchy ground-glass opacity in the lungs bilaterally with superimposed areas of confluent consolidative lung disease, most prominently in the right upper lobe, lingula, and right lower lobe. Imaging features compatible with multifocal  pneumonia. 3. Moderate right and small left pleural effusions. 4. 2.5 cm left thyroid nodule. This has been evaluated on previous imaging. (ref: J Am Coll Radiol. 2015 Feb;12(2): 143-50). 5. Diffuse body wall edema. Electronically Signed   By: Misty Stanley M.D.   On: 08/10/2020 06:48   DG Chest Port  1 View  Result Date: 08/10/2020 CLINICAL DATA:  Status post thoracentesis EXAM: PORTABLE CHEST 1 VIEW COMPARISON:  Chest radiograph and chest CT August 10, 2020 FINDINGS: No appreciable pneumothorax. No appreciable pleural effusion noted post thoracentesis. Airspace opacity persists throughout portions of both mid and lower lung regions as well as the right upper lobe. Heart size and pulmonary vascularity normal. No adenopathy. No bone lesions. IMPRESSION: No pneumothorax. No appreciable pleural effusion. Multifocal airspace opacity remains in both lungs consistent with multifocal pneumonia. A degree of superimposed pulmonary edema cannot be excluded. No new opacity evident. Cardiac silhouette within normal limits. Electronically Signed   By: Lowella Grip III M.D.   On: 08/10/2020 15:08   DG Chest Port 1 View  Result Date: 08/10/2020 CLINICAL DATA:  Shortness of breath, covid positive EXAM: PORTABLE CHEST 1 VIEW COMPARISON:  None. FINDINGS: The heart size and mediastinal contours are partially obscured. Hazy patchy airspace opacities are seen throughout both lungs, right worse than left. Trace bilateral pleural effusions are present. No acute osseous abnormality. IMPRESSION: Extensive bilateral airspace opacities, right greater than left, consistent with atypical viral pneumonia Trace bilateral pleural effusions Electronically Signed   By: Prudencio Pair M.D.   On: 08/10/2020 03:32   ECHOCARDIOGRAM COMPLETE  Result Date: 08/10/2020    ECHOCARDIOGRAM REPORT   Patient Name:   Sharon Kirk Date of Exam: 08/10/2020 Medical Rec #:  [id number]       Height:       65.0 in Accession #:    [id number]      Weight:       158.0 lb Date of Birth:  Aug 22, 1961        BSA:          1.790 m Patient Age:    35 years        BP:           144/87 mmHg Patient Gender: F               HR:           87 bpm. Exam Location:  Inpatient Procedure: 2D Echo, Cardiac Doppler and Color Doppler Indications:    I50.9* Heart failure  (unspecified)  History:        Patient has no prior history of Echocardiogram examinations.                 Signs/Symptoms:Shortness of Breath and Dyspnea; Risk                 Factors:Dyslipidemia. Covid 19 positive. Pneumonia. Cancer.  Sonographer:    Roseanna Rainbow RDCS Referring Phys: [id number] Shellia Cleverly  Sonographer Comments: Technically difficult study due to poor echo windows. Image acquisition challenging due to respiratory motion. Images very respiratory dependent. Apical images off axis medial, no lateral apicals. IMPRESSIONS  1. Left ventricular ejection fraction, by estimation, is 60 to 65%. The left ventricle has normal function. The left ventricle has no regional wall motion abnormalities. Left ventricular diastolic parameters were normal.  2. The mid-free wall of the right ventricle is hypokinetic, while apical contractility and overall excursion of the base are normal. There is also enhanced respiratory  septal displacement. This may represent a forme frust of McConnell's sign (acute cor pulmonale). Right ventricular systolic function is normal. The right ventricular size is normal. There is mildly elevated pulmonary artery systolic pressure. The estimated right ventricular systolic pressure is 03.4 mmHg.  3. The mitral valve is normal in structure. No evidence of mitral valve regurgitation. No evidence of mitral stenosis.  4. The aortic valve is normal in structure. Aortic valve regurgitation is not visualized. No aortic stenosis is present.  5. The inferior vena cava is dilated in size with <50% respiratory variability, suggesting right atrial pressure of 15 mmHg. Comparison(s): No prior Echocardiogram. Consider acute cor pulmonale due to acute pulmonary embolism or ARDS. FINDINGS  Left Ventricle: Left ventricular ejection fraction, by estimation, is 60 to 65%. The left ventricle has normal function. The left ventricle has no regional wall motion abnormalities. The left ventricular internal cavity  size was normal in size. There is  no left ventricular hypertrophy. Left ventricular diastolic parameters were normal. Normal left ventricular filling pressure. Right Ventricle: The mid-free wall of the right ventricle is hypokinetic, while apical contractility and overall excursion of the base are normal. There is also enhanced respiratory septal displacement. This may represent a forme frust of McConnell's sign (acute cor pulmonale). The right ventricular size is normal. No increase in right ventricular wall thickness. Right ventricular systolic function is normal. There is mildly elevated pulmonary artery systolic pressure. The tricuspid regurgitant velocity is 2.67 m/s, and with an assumed right atrial pressure of 15 mmHg, the estimated right ventricular systolic pressure is 74.2 mmHg. Left Atrium: Left atrial size was normal in size. Right Atrium: Right atrial size was normal in size. Pericardium: Trivial pericardial effusion is present. Mitral Valve: The mitral valve is normal in structure. No evidence of mitral valve regurgitation. No evidence of mitral valve stenosis. Tricuspid Valve: The tricuspid valve is normal in structure. Tricuspid valve regurgitation is trivial. No evidence of tricuspid stenosis. Aortic Valve: The aortic valve is normal in structure. Aortic valve regurgitation is not visualized. No aortic stenosis is present. Pulmonic Valve: The pulmonic valve was normal in structure. Pulmonic valve regurgitation is not visualized. No evidence of pulmonic stenosis. Aorta: The aortic root is normal in size and structure. Venous: The inferior vena cava is dilated in size with less than 50% respiratory variability, suggesting right atrial pressure of 15 mmHg. IAS/Shunts: No atrial level shunt detected by color flow Doppler. Additional Comments: There is pleural effusion in the left lateral region.  LEFT VENTRICLE PLAX 2D LVIDd:         3.90 cm     Diastology LVIDs:         2.60 cm     LV e' medial:     8.05 cm/s LV PW:         1.30 cm     LV E/e' medial:  9.1 LV IVS:        1.00 cm     LV e' lateral:   10.30 cm/s LVOT diam:     1.70 cm     LV E/e' lateral: 7.1 LV SV:         39 LV SV Index:   22 LVOT Area:     2.27 cm  LV Volumes (MOD) LV vol d, MOD A2C: 49.6 ml LV vol d, MOD A4C: 69.9 ml LV vol s, MOD A2C: 24.3 ml LV vol s, MOD A4C: 23.7 ml LV SV MOD A2C:     25.3 ml LV  SV MOD A4C:     69.9 ml LV SV MOD BP:      38.3 ml RIGHT VENTRICLE             IVC RV S prime:     17.70 cm/s  IVC diam: 2.40 cm TAPSE (M-mode): 2.4 cm LEFT ATRIUM           Index       RIGHT ATRIUM           Index LA diam:      3.20 cm 1.79 cm/m  RA Area:     13.70 cm LA Vol (A2C): 18.9 ml 10.56 ml/m RA Volume:   31.70 ml  17.71 ml/m LA Vol (A4C): 9.9 ml  5.50 ml/m  AORTIC VALVE LVOT Vmax:   103.00 cm/s LVOT Vmean:  75.500 cm/s LVOT VTI:    0.172 m  AORTA Ao Root diam: 3.00 cm MITRAL VALVE               TRICUSPID VALVE MV Area (PHT): 3.66 cm    TR Peak grad:   28.5 mmHg MV Decel Time: 208 msec    TR Vmax:        267.00 cm/s MV E velocity: 73.23 cm/s MV A velocity: 67.15 cm/s  SHUNTS MV E/A ratio:  1.09        Systemic VTI:  0.17 m                            Systemic Diam: 1.70 cm Dani Gobble Croitoru MD Electronically signed by Sanda Klein MD Signature Date/Time: 08/10/2020/12:20:16 PM    Final    VAS [place] LOWER EXTREMITY VENOUS (DVT)  Result Date: 08/10/2020  Lower Venous DVTStudy Indications: Elevated Ddimer.  Risk Factors: COVID 19 positive. Comparison Study: No prior studies. Performing Technologist: Oliver Hum RVT  Examination Guidelines: A complete evaluation includes B-mode imaging, spectral Doppler, color Doppler, and power Doppler as needed of all accessible portions of each vessel. Bilateral testing is considered an integral part of a complete examination. Limited examinations for reoccurring indications may be performed as noted. The reflux portion of the exam is performed with the patient in reverse Trendelenburg.   +---------+---------------+---------+-----------+----------+--------------+ RIGHT    CompressibilityPhasicitySpontaneityPropertiesThrombus Aging +---------+---------------+---------+-----------+----------+--------------+ CFV      Full           Yes      Yes                                 +---------+---------------+---------+-----------+----------+--------------+ SFJ      Full                                                        +---------+---------------+---------+-----------+----------+--------------+ FV Prox  Full                                                        +---------+---------------+---------+-----------+----------+--------------+ FV Mid   Full                                                        +---------+---------------+---------+-----------+----------+--------------+  FV DistalFull                                                        +---------+---------------+---------+-----------+----------+--------------+ PFV      Full                                                        +---------+---------------+---------+-----------+----------+--------------+ POP      Full           Yes      Yes                                 +---------+---------------+---------+-----------+----------+--------------+ PTV      Full                                                        +---------+---------------+---------+-----------+----------+--------------+ PERO     Full                                                        +---------+---------------+---------+-----------+----------+--------------+   +---------+---------------+---------+-----------+----------+--------------+ LEFT     CompressibilityPhasicitySpontaneityPropertiesThrombus Aging +---------+---------------+---------+-----------+----------+--------------+ CFV      Full           Yes      Yes                                  +---------+---------------+---------+-----------+----------+--------------+ SFJ      Full                                                        +---------+---------------+---------+-----------+----------+--------------+ FV Prox  Full                                                        +---------+---------------+---------+-----------+----------+--------------+ FV Mid   Full                                                        +---------+---------------+---------+-----------+----------+--------------+ FV DistalFull                                                        +---------+---------------+---------+-----------+----------+--------------+  PFV      Full                                                        +---------+---------------+---------+-----------+----------+--------------+ POP      Full           Yes      Yes                                 +---------+---------------+---------+-----------+----------+--------------+ PTV      Partial                                      Acute          +---------+---------------+---------+-----------+----------+--------------+ PERO     Full                                                        +---------+---------------+---------+-----------+----------+--------------+     Summary: RIGHT: - There is no evidence of deep vein thrombosis in the lower extremity.  - No cystic structure found in the popliteal fossa.  LEFT: - Findings consistent with acute deep vein thrombosis involving the left posterior tibial veins. - No cystic structure found in the popliteal fossa.  *See table(s) above for measurements and observations. Electronically signed by Deitra Mayo MD on 08/10/2020 at 5:39:47 PM.    Final      Labs:   Basic Metabolic Panel: Recent Labs  Lab 08/18/20 0313 08/18/20 0313 08/19/20 0300 08/19/20 0300 08/20/20 0256 08/21/20 0237  NA 136  --  135  --  134* 136  K 5.3*   < > 4.9   < > 4.3 4.7   CL 102  --  100  --  98 99  CO2 28  --  27  --  26 28  GLUCOSE 149*  --  146*  --  148* 144*  BUN 16  --  17  --  15 19  CREATININE 0.42*  --  0.44  --  0.40* 0.46  CALCIUM 7.9*  --  8.0*  --  7.8* 8.1*   < > = values in this interval not displayed.   GFR Estimated Creatinine Clearance: 66.5 mL/min (by C-G formula based on SCr of 0.46 mg/dL). Liver Function Tests: Recent Labs  Lab 08/18/20 0313 08/20/20 0256 08/21/20 0237  AST 17 18 18   ALT 45* 44 52*  ALKPHOS 79 71 66  BILITOT 0.6 0.8 0.9  PROT 4.8* 5.0* 5.1*  ALBUMIN 2.3* 2.3* 2.5*   No results for input(s): LIPASE, AMYLASE in the last 168 hours. No results for input(s): AMMONIA in the last 168 hours. Coagulation profile No results for input(s): INR, PROTIME in the last 168 hours.  CBC: Recent Labs  Lab 08/18/20 0313 08/19/20 0300 08/20/20 0256 08/21/20 0237  WBC 14.4* 13.4* 13.5* 13.3*  NEUTROABS  --   --  12.6* 12.3*  HGB 12.6 12.2 12.3 12.9  HCT 38.9 37.4 38.4 40.4  MCV 89.0 87.8 88.3 89.0  PLT 398 378 387  411*   Cardiac Enzymes: No results for input(s): CKTOTAL, CKMB, CKMBINDEX, TROPONINI in the last 168 hours. BNP: Invalid input(s): POCBNP CBG: No results for input(s): GLUCAP in the last 168 hours. D-Dimer Recent Labs    08/23/20 0430 08/24/20 0504  DDIMER 1.29* 1.57*   Hgb A1c No results for input(s): HGBA1C in the last 72 hours. Lipid Profile No results for input(s): CHOL, HDL, LDLCALC, TRIG, CHOLHDL, LDLDIRECT in the last 72 hours. Thyroid function studies No results for input(s): TSH, T4TOTAL, T3FREE, THYROIDAB in the last 72 hours.  Invalid input(s): FREET3 Anemia work up Recent Labs    08/23/20 0430 08/24/20 0504  FERRITIN 349* 334*   Microbiology No results found for this or any previous visit (from the past 240 hour(s)).   Discharge Instructions:   Discharge Instructions    Call MD for:  difficulty breathing, headache or visual disturbances   Complete by: As directed     Call MD for:  extreme fatigue   Complete by: As directed    Diet general   Complete by: As directed    Discharge instructions   Complete by: As directed    Follow-up with your primary care physician in 1 week.  Do not overexert.  Continue to use oxygen as prescribed.  Please discuss with your primary care physician regarding use of oxygen in the next visit   Increase activity slowly   Complete by: As directed      Allergies as of 08/24/2020      Reactions   Codeine Nausea Only, Other (See Comments)   Hallucinations; confusion      Medication List    STOP taking these medications   estradiol 0.5 MG tablet Commonly known as: ESTRACE     TAKE these medications   albuterol 108 (90 Base) MCG/ACT inhaler Commonly known as: VENTOLIN HFA Inhale 1 puff into the lungs every 4 (four) hours as needed for wheezing or shortness of breath.   apixaban 5 MG Tabs tablet Commonly known as: ELIQUIS Take 1 tablet (5 mg total) by mouth 2 (two) times daily.   calcium carbonate 600 MG Tabs tablet Commonly known as: OS-CAL Take 600 mg by mouth 2 (two) times daily with a meal.   chlorpheniramine-HYDROcodone 10-8 MG/5ML Suer Commonly known as: TUSSIONEX Take 5 mLs by mouth every 12 (twelve) hours.   cholecalciferol 1000 units tablet Commonly known as: VITAMIN D Take 5,000 Units by mouth daily.   polyethylene glycol 17 g packet Commonly known as: MIRALAX / GLYCOLAX Take 17 g by mouth daily as needed for moderate constipation.   VITAMIN B-12 CR PO Take 1 tablet by mouth daily.            Durable Medical Equipment  (From admission, onward)         Start     Ordered   08/24/20 1131  For home use only DME oxygen  Once       Question Answer Comment  Length of Need 6 Months   Mode or (Route) Nasal cannula   Liters per Minute 3   Frequency Continuous (stationary and portable oxygen unit needed)   Oxygen conserving device Yes   Oxygen delivery system Gas      08/24/20 1131           Follow-up Information    Primary care provider. Schedule an appointment as soon as possible for a visit.   Why: regular followup and blood work  Time coordinating discharge: 39 minutes  Signed:  Ulyana Pitones  [facility] 08/24/2020, 12:01 PM

## 2020-08-28 DIAGNOSIS — J80 Acute respiratory distress syndrome: Secondary | ICD-10-CM | POA: Diagnosis not present

## 2020-08-28 DIAGNOSIS — U071 COVID-19: Secondary | ICD-10-CM | POA: Diagnosis not present

## 2020-08-28 DIAGNOSIS — Z7901 Long term (current) use of anticoagulants: Secondary | ICD-10-CM | POA: Diagnosis not present

## 2020-08-28 DIAGNOSIS — Z85820 Personal history of malignant melanoma of skin: Secondary | ICD-10-CM | POA: Diagnosis not present

## 2020-08-28 DIAGNOSIS — J90 Pleural effusion, not elsewhere classified: Secondary | ICD-10-CM | POA: Diagnosis not present

## 2020-08-28 DIAGNOSIS — Z79899 Other long term (current) drug therapy: Secondary | ICD-10-CM | POA: Diagnosis not present

## 2020-08-28 DIAGNOSIS — E785 Hyperlipidemia, unspecified: Secondary | ICD-10-CM | POA: Diagnosis not present

## 2020-08-28 DIAGNOSIS — K589 Irritable bowel syndrome without diarrhea: Secondary | ICD-10-CM | POA: Diagnosis not present

## 2020-08-28 DIAGNOSIS — Z9981 Dependence on supplemental oxygen: Secondary | ICD-10-CM | POA: Diagnosis not present

## 2020-08-28 DIAGNOSIS — M8580 Other specified disorders of bone density and structure, unspecified site: Secondary | ICD-10-CM | POA: Diagnosis not present

## 2020-08-28 DIAGNOSIS — J1282 Pneumonia due to coronavirus disease 2019: Secondary | ICD-10-CM | POA: Diagnosis not present

## 2020-08-28 DIAGNOSIS — E041 Nontoxic single thyroid nodule: Secondary | ICD-10-CM | POA: Diagnosis not present

## 2020-08-28 DIAGNOSIS — M810 Age-related osteoporosis without current pathological fracture: Secondary | ICD-10-CM | POA: Diagnosis not present

## 2020-08-28 DIAGNOSIS — Z9181 History of falling: Secondary | ICD-10-CM | POA: Diagnosis not present

## 2020-08-28 DIAGNOSIS — I82402 Acute embolism and thrombosis of unspecified deep veins of left lower extremity: Secondary | ICD-10-CM | POA: Diagnosis not present

## 2020-08-28 DIAGNOSIS — I272 Pulmonary hypertension, unspecified: Secondary | ICD-10-CM | POA: Diagnosis not present

## 2020-08-30 ENCOUNTER — Ambulatory Visit: Payer: Self-pay

## 2020-09-01 DIAGNOSIS — J80 Acute respiratory distress syndrome: Secondary | ICD-10-CM | POA: Diagnosis not present

## 2020-09-01 DIAGNOSIS — U071 COVID-19: Secondary | ICD-10-CM | POA: Diagnosis not present

## 2020-09-01 DIAGNOSIS — Z79899 Other long term (current) drug therapy: Secondary | ICD-10-CM | POA: Diagnosis not present

## 2020-09-01 DIAGNOSIS — K589 Irritable bowel syndrome without diarrhea: Secondary | ICD-10-CM | POA: Diagnosis not present

## 2020-09-01 DIAGNOSIS — Z9981 Dependence on supplemental oxygen: Secondary | ICD-10-CM | POA: Diagnosis not present

## 2020-09-01 DIAGNOSIS — E041 Nontoxic single thyroid nodule: Secondary | ICD-10-CM | POA: Diagnosis not present

## 2020-09-01 DIAGNOSIS — E785 Hyperlipidemia, unspecified: Secondary | ICD-10-CM | POA: Diagnosis not present

## 2020-09-01 DIAGNOSIS — J90 Pleural effusion, not elsewhere classified: Secondary | ICD-10-CM | POA: Diagnosis not present

## 2020-09-01 DIAGNOSIS — Z9181 History of falling: Secondary | ICD-10-CM | POA: Diagnosis not present

## 2020-09-01 DIAGNOSIS — I272 Pulmonary hypertension, unspecified: Secondary | ICD-10-CM | POA: Diagnosis not present

## 2020-09-01 DIAGNOSIS — J1282 Pneumonia due to coronavirus disease 2019: Secondary | ICD-10-CM | POA: Diagnosis not present

## 2020-09-01 DIAGNOSIS — M8580 Other specified disorders of bone density and structure, unspecified site: Secondary | ICD-10-CM | POA: Diagnosis not present

## 2020-09-01 DIAGNOSIS — Z7901 Long term (current) use of anticoagulants: Secondary | ICD-10-CM | POA: Diagnosis not present

## 2020-09-01 DIAGNOSIS — Z85820 Personal history of malignant melanoma of skin: Secondary | ICD-10-CM | POA: Diagnosis not present

## 2020-09-01 DIAGNOSIS — M810 Age-related osteoporosis without current pathological fracture: Secondary | ICD-10-CM | POA: Diagnosis not present

## 2020-09-01 DIAGNOSIS — I82402 Acute embolism and thrombosis of unspecified deep veins of left lower extremity: Secondary | ICD-10-CM | POA: Diagnosis not present

## 2020-09-02 DIAGNOSIS — Z9181 History of falling: Secondary | ICD-10-CM | POA: Diagnosis not present

## 2020-09-02 DIAGNOSIS — Z85820 Personal history of malignant melanoma of skin: Secondary | ICD-10-CM | POA: Diagnosis not present

## 2020-09-02 DIAGNOSIS — I82492 Acute embolism and thrombosis of other specified deep vein of left lower extremity: Secondary | ICD-10-CM | POA: Diagnosis not present

## 2020-09-02 DIAGNOSIS — Z7901 Long term (current) use of anticoagulants: Secondary | ICD-10-CM | POA: Diagnosis not present

## 2020-09-02 DIAGNOSIS — I82402 Acute embolism and thrombosis of unspecified deep veins of left lower extremity: Secondary | ICD-10-CM | POA: Diagnosis not present

## 2020-09-02 DIAGNOSIS — M810 Age-related osteoporosis without current pathological fracture: Secondary | ICD-10-CM | POA: Diagnosis not present

## 2020-09-02 DIAGNOSIS — K589 Irritable bowel syndrome without diarrhea: Secondary | ICD-10-CM | POA: Diagnosis not present

## 2020-09-02 DIAGNOSIS — M8580 Other specified disorders of bone density and structure, unspecified site: Secondary | ICD-10-CM | POA: Diagnosis not present

## 2020-09-02 DIAGNOSIS — J90 Pleural effusion, not elsewhere classified: Secondary | ICD-10-CM | POA: Diagnosis not present

## 2020-09-02 DIAGNOSIS — J80 Acute respiratory distress syndrome: Secondary | ICD-10-CM | POA: Diagnosis not present

## 2020-09-02 DIAGNOSIS — U071 COVID-19: Secondary | ICD-10-CM | POA: Diagnosis not present

## 2020-09-02 DIAGNOSIS — J1282 Pneumonia due to coronavirus disease 2019: Secondary | ICD-10-CM | POA: Diagnosis not present

## 2020-09-02 DIAGNOSIS — Z8616 Personal history of COVID-19: Secondary | ICD-10-CM | POA: Diagnosis not present

## 2020-09-02 DIAGNOSIS — Z79899 Other long term (current) drug therapy: Secondary | ICD-10-CM | POA: Diagnosis not present

## 2020-09-02 DIAGNOSIS — Z9981 Dependence on supplemental oxygen: Secondary | ICD-10-CM | POA: Diagnosis not present

## 2020-09-02 DIAGNOSIS — E041 Nontoxic single thyroid nodule: Secondary | ICD-10-CM | POA: Diagnosis not present

## 2020-09-02 DIAGNOSIS — I272 Pulmonary hypertension, unspecified: Secondary | ICD-10-CM | POA: Diagnosis not present

## 2020-09-02 DIAGNOSIS — E785 Hyperlipidemia, unspecified: Secondary | ICD-10-CM | POA: Diagnosis not present

## 2020-09-06 DIAGNOSIS — E041 Nontoxic single thyroid nodule: Secondary | ICD-10-CM | POA: Diagnosis not present

## 2020-09-06 DIAGNOSIS — J90 Pleural effusion, not elsewhere classified: Secondary | ICD-10-CM | POA: Diagnosis not present

## 2020-09-06 DIAGNOSIS — Z7901 Long term (current) use of anticoagulants: Secondary | ICD-10-CM | POA: Diagnosis not present

## 2020-09-06 DIAGNOSIS — M8580 Other specified disorders of bone density and structure, unspecified site: Secondary | ICD-10-CM | POA: Diagnosis not present

## 2020-09-06 DIAGNOSIS — Z9981 Dependence on supplemental oxygen: Secondary | ICD-10-CM | POA: Diagnosis not present

## 2020-09-06 DIAGNOSIS — U071 COVID-19: Secondary | ICD-10-CM | POA: Diagnosis not present

## 2020-09-06 DIAGNOSIS — M810 Age-related osteoporosis without current pathological fracture: Secondary | ICD-10-CM | POA: Diagnosis not present

## 2020-09-06 DIAGNOSIS — Z85820 Personal history of malignant melanoma of skin: Secondary | ICD-10-CM | POA: Diagnosis not present

## 2020-09-06 DIAGNOSIS — K589 Irritable bowel syndrome without diarrhea: Secondary | ICD-10-CM | POA: Diagnosis not present

## 2020-09-06 DIAGNOSIS — Z79899 Other long term (current) drug therapy: Secondary | ICD-10-CM | POA: Diagnosis not present

## 2020-09-06 DIAGNOSIS — I82402 Acute embolism and thrombosis of unspecified deep veins of left lower extremity: Secondary | ICD-10-CM | POA: Diagnosis not present

## 2020-09-06 DIAGNOSIS — Z9181 History of falling: Secondary | ICD-10-CM | POA: Diagnosis not present

## 2020-09-06 DIAGNOSIS — I272 Pulmonary hypertension, unspecified: Secondary | ICD-10-CM | POA: Diagnosis not present

## 2020-09-06 DIAGNOSIS — E785 Hyperlipidemia, unspecified: Secondary | ICD-10-CM | POA: Diagnosis not present

## 2020-09-06 DIAGNOSIS — J1282 Pneumonia due to coronavirus disease 2019: Secondary | ICD-10-CM | POA: Diagnosis not present

## 2020-09-06 DIAGNOSIS — J80 Acute respiratory distress syndrome: Secondary | ICD-10-CM | POA: Diagnosis not present

## 2020-09-08 DIAGNOSIS — J90 Pleural effusion, not elsewhere classified: Secondary | ICD-10-CM | POA: Diagnosis not present

## 2020-09-08 DIAGNOSIS — E041 Nontoxic single thyroid nodule: Secondary | ICD-10-CM | POA: Diagnosis not present

## 2020-09-08 DIAGNOSIS — E785 Hyperlipidemia, unspecified: Secondary | ICD-10-CM | POA: Diagnosis not present

## 2020-09-08 DIAGNOSIS — K589 Irritable bowel syndrome without diarrhea: Secondary | ICD-10-CM | POA: Diagnosis not present

## 2020-09-08 DIAGNOSIS — Z9181 History of falling: Secondary | ICD-10-CM | POA: Diagnosis not present

## 2020-09-08 DIAGNOSIS — I272 Pulmonary hypertension, unspecified: Secondary | ICD-10-CM | POA: Diagnosis not present

## 2020-09-08 DIAGNOSIS — M810 Age-related osteoporosis without current pathological fracture: Secondary | ICD-10-CM | POA: Diagnosis not present

## 2020-09-08 DIAGNOSIS — I82402 Acute embolism and thrombosis of unspecified deep veins of left lower extremity: Secondary | ICD-10-CM | POA: Diagnosis not present

## 2020-09-08 DIAGNOSIS — Z79899 Other long term (current) drug therapy: Secondary | ICD-10-CM | POA: Diagnosis not present

## 2020-09-08 DIAGNOSIS — J1282 Pneumonia due to coronavirus disease 2019: Secondary | ICD-10-CM | POA: Diagnosis not present

## 2020-09-08 DIAGNOSIS — U071 COVID-19: Secondary | ICD-10-CM | POA: Diagnosis not present

## 2020-09-08 DIAGNOSIS — Z9981 Dependence on supplemental oxygen: Secondary | ICD-10-CM | POA: Diagnosis not present

## 2020-09-08 DIAGNOSIS — Z85820 Personal history of malignant melanoma of skin: Secondary | ICD-10-CM | POA: Diagnosis not present

## 2020-09-08 DIAGNOSIS — M8580 Other specified disorders of bone density and structure, unspecified site: Secondary | ICD-10-CM | POA: Diagnosis not present

## 2020-09-08 DIAGNOSIS — J80 Acute respiratory distress syndrome: Secondary | ICD-10-CM | POA: Diagnosis not present

## 2020-09-08 DIAGNOSIS — Z7901 Long term (current) use of anticoagulants: Secondary | ICD-10-CM | POA: Diagnosis not present

## 2020-09-15 DIAGNOSIS — E785 Hyperlipidemia, unspecified: Secondary | ICD-10-CM | POA: Diagnosis not present

## 2020-09-15 DIAGNOSIS — M8580 Other specified disorders of bone density and structure, unspecified site: Secondary | ICD-10-CM | POA: Diagnosis not present

## 2020-09-15 DIAGNOSIS — U071 COVID-19: Secondary | ICD-10-CM | POA: Diagnosis not present

## 2020-09-15 DIAGNOSIS — Z7901 Long term (current) use of anticoagulants: Secondary | ICD-10-CM | POA: Diagnosis not present

## 2020-09-15 DIAGNOSIS — Z9181 History of falling: Secondary | ICD-10-CM | POA: Diagnosis not present

## 2020-09-15 DIAGNOSIS — E041 Nontoxic single thyroid nodule: Secondary | ICD-10-CM | POA: Diagnosis not present

## 2020-09-15 DIAGNOSIS — I272 Pulmonary hypertension, unspecified: Secondary | ICD-10-CM | POA: Diagnosis not present

## 2020-09-15 DIAGNOSIS — K589 Irritable bowel syndrome without diarrhea: Secondary | ICD-10-CM | POA: Diagnosis not present

## 2020-09-15 DIAGNOSIS — I82402 Acute embolism and thrombosis of unspecified deep veins of left lower extremity: Secondary | ICD-10-CM | POA: Diagnosis not present

## 2020-09-15 DIAGNOSIS — J1282 Pneumonia due to coronavirus disease 2019: Secondary | ICD-10-CM | POA: Diagnosis not present

## 2020-09-15 DIAGNOSIS — Z85820 Personal history of malignant melanoma of skin: Secondary | ICD-10-CM | POA: Diagnosis not present

## 2020-09-15 DIAGNOSIS — J90 Pleural effusion, not elsewhere classified: Secondary | ICD-10-CM | POA: Diagnosis not present

## 2020-09-15 DIAGNOSIS — M810 Age-related osteoporosis without current pathological fracture: Secondary | ICD-10-CM | POA: Diagnosis not present

## 2020-09-15 DIAGNOSIS — Z79899 Other long term (current) drug therapy: Secondary | ICD-10-CM | POA: Diagnosis not present

## 2020-09-15 DIAGNOSIS — Z9981 Dependence on supplemental oxygen: Secondary | ICD-10-CM | POA: Diagnosis not present

## 2020-09-15 DIAGNOSIS — J80 Acute respiratory distress syndrome: Secondary | ICD-10-CM | POA: Diagnosis not present

## 2020-09-17 DIAGNOSIS — Z9181 History of falling: Secondary | ICD-10-CM | POA: Diagnosis not present

## 2020-09-17 DIAGNOSIS — Z7901 Long term (current) use of anticoagulants: Secondary | ICD-10-CM | POA: Diagnosis not present

## 2020-09-17 DIAGNOSIS — I82402 Acute embolism and thrombosis of unspecified deep veins of left lower extremity: Secondary | ICD-10-CM | POA: Diagnosis not present

## 2020-09-17 DIAGNOSIS — M810 Age-related osteoporosis without current pathological fracture: Secondary | ICD-10-CM | POA: Diagnosis not present

## 2020-09-17 DIAGNOSIS — Z85820 Personal history of malignant melanoma of skin: Secondary | ICD-10-CM | POA: Diagnosis not present

## 2020-09-17 DIAGNOSIS — Z79899 Other long term (current) drug therapy: Secondary | ICD-10-CM | POA: Diagnosis not present

## 2020-09-17 DIAGNOSIS — J80 Acute respiratory distress syndrome: Secondary | ICD-10-CM | POA: Diagnosis not present

## 2020-09-17 DIAGNOSIS — M8580 Other specified disorders of bone density and structure, unspecified site: Secondary | ICD-10-CM | POA: Diagnosis not present

## 2020-09-17 DIAGNOSIS — K589 Irritable bowel syndrome without diarrhea: Secondary | ICD-10-CM | POA: Diagnosis not present

## 2020-09-17 DIAGNOSIS — J90 Pleural effusion, not elsewhere classified: Secondary | ICD-10-CM | POA: Diagnosis not present

## 2020-09-17 DIAGNOSIS — U071 COVID-19: Secondary | ICD-10-CM | POA: Diagnosis not present

## 2020-09-17 DIAGNOSIS — Z9981 Dependence on supplemental oxygen: Secondary | ICD-10-CM | POA: Diagnosis not present

## 2020-09-17 DIAGNOSIS — E785 Hyperlipidemia, unspecified: Secondary | ICD-10-CM | POA: Diagnosis not present

## 2020-09-17 DIAGNOSIS — E041 Nontoxic single thyroid nodule: Secondary | ICD-10-CM | POA: Diagnosis not present

## 2020-09-17 DIAGNOSIS — I272 Pulmonary hypertension, unspecified: Secondary | ICD-10-CM | POA: Diagnosis not present

## 2020-09-17 DIAGNOSIS — J1282 Pneumonia due to coronavirus disease 2019: Secondary | ICD-10-CM | POA: Diagnosis not present

## 2020-09-20 DIAGNOSIS — K589 Irritable bowel syndrome without diarrhea: Secondary | ICD-10-CM | POA: Diagnosis not present

## 2020-09-20 DIAGNOSIS — I82402 Acute embolism and thrombosis of unspecified deep veins of left lower extremity: Secondary | ICD-10-CM | POA: Diagnosis not present

## 2020-09-20 DIAGNOSIS — J80 Acute respiratory distress syndrome: Secondary | ICD-10-CM | POA: Diagnosis not present

## 2020-09-20 DIAGNOSIS — E785 Hyperlipidemia, unspecified: Secondary | ICD-10-CM | POA: Diagnosis not present

## 2020-09-20 DIAGNOSIS — Z9181 History of falling: Secondary | ICD-10-CM | POA: Diagnosis not present

## 2020-09-20 DIAGNOSIS — Z85820 Personal history of malignant melanoma of skin: Secondary | ICD-10-CM | POA: Diagnosis not present

## 2020-09-20 DIAGNOSIS — M810 Age-related osteoporosis without current pathological fracture: Secondary | ICD-10-CM | POA: Diagnosis not present

## 2020-09-20 DIAGNOSIS — M8580 Other specified disorders of bone density and structure, unspecified site: Secondary | ICD-10-CM | POA: Diagnosis not present

## 2020-09-20 DIAGNOSIS — E041 Nontoxic single thyroid nodule: Secondary | ICD-10-CM | POA: Diagnosis not present

## 2020-09-20 DIAGNOSIS — Z79899 Other long term (current) drug therapy: Secondary | ICD-10-CM | POA: Diagnosis not present

## 2020-09-20 DIAGNOSIS — J90 Pleural effusion, not elsewhere classified: Secondary | ICD-10-CM | POA: Diagnosis not present

## 2020-09-20 DIAGNOSIS — U071 COVID-19: Secondary | ICD-10-CM | POA: Diagnosis not present

## 2020-09-20 DIAGNOSIS — I272 Pulmonary hypertension, unspecified: Secondary | ICD-10-CM | POA: Diagnosis not present

## 2020-09-20 DIAGNOSIS — Z9981 Dependence on supplemental oxygen: Secondary | ICD-10-CM | POA: Diagnosis not present

## 2020-09-20 DIAGNOSIS — J1282 Pneumonia due to coronavirus disease 2019: Secondary | ICD-10-CM | POA: Diagnosis not present

## 2020-09-20 DIAGNOSIS — Z7901 Long term (current) use of anticoagulants: Secondary | ICD-10-CM | POA: Diagnosis not present

## 2020-09-23 DIAGNOSIS — J80 Acute respiratory distress syndrome: Secondary | ICD-10-CM | POA: Diagnosis not present

## 2020-09-23 DIAGNOSIS — J9601 Acute respiratory failure with hypoxia: Secondary | ICD-10-CM | POA: Diagnosis not present

## 2020-09-23 DIAGNOSIS — Z7901 Long term (current) use of anticoagulants: Secondary | ICD-10-CM | POA: Diagnosis not present

## 2020-09-23 DIAGNOSIS — Z9981 Dependence on supplemental oxygen: Secondary | ICD-10-CM | POA: Diagnosis not present

## 2020-09-23 DIAGNOSIS — E785 Hyperlipidemia, unspecified: Secondary | ICD-10-CM | POA: Diagnosis not present

## 2020-09-23 DIAGNOSIS — Z9181 History of falling: Secondary | ICD-10-CM | POA: Diagnosis not present

## 2020-09-23 DIAGNOSIS — M8580 Other specified disorders of bone density and structure, unspecified site: Secondary | ICD-10-CM | POA: Diagnosis not present

## 2020-09-23 DIAGNOSIS — I82402 Acute embolism and thrombosis of unspecified deep veins of left lower extremity: Secondary | ICD-10-CM | POA: Diagnosis not present

## 2020-09-23 DIAGNOSIS — I272 Pulmonary hypertension, unspecified: Secondary | ICD-10-CM | POA: Diagnosis not present

## 2020-09-23 DIAGNOSIS — J90 Pleural effusion, not elsewhere classified: Secondary | ICD-10-CM | POA: Diagnosis not present

## 2020-09-23 DIAGNOSIS — U071 COVID-19: Secondary | ICD-10-CM | POA: Diagnosis not present

## 2020-09-23 DIAGNOSIS — E041 Nontoxic single thyroid nodule: Secondary | ICD-10-CM | POA: Diagnosis not present

## 2020-09-23 DIAGNOSIS — K589 Irritable bowel syndrome without diarrhea: Secondary | ICD-10-CM | POA: Diagnosis not present

## 2020-09-23 DIAGNOSIS — M810 Age-related osteoporosis without current pathological fracture: Secondary | ICD-10-CM | POA: Diagnosis not present

## 2020-09-23 DIAGNOSIS — Z85820 Personal history of malignant melanoma of skin: Secondary | ICD-10-CM | POA: Diagnosis not present

## 2020-09-23 DIAGNOSIS — J1282 Pneumonia due to coronavirus disease 2019: Secondary | ICD-10-CM | POA: Diagnosis not present

## 2020-09-23 DIAGNOSIS — Z79899 Other long term (current) drug therapy: Secondary | ICD-10-CM | POA: Diagnosis not present

## 2020-09-27 DIAGNOSIS — I82492 Acute embolism and thrombosis of other specified deep vein of left lower extremity: Secondary | ICD-10-CM | POA: Diagnosis not present

## 2020-09-27 DIAGNOSIS — U099 Post covid-19 condition, unspecified: Secondary | ICD-10-CM | POA: Diagnosis not present

## 2020-09-29 DIAGNOSIS — U071 COVID-19: Secondary | ICD-10-CM | POA: Diagnosis not present

## 2020-09-29 DIAGNOSIS — Z1211 Encounter for screening for malignant neoplasm of colon: Secondary | ICD-10-CM | POA: Diagnosis not present

## 2020-09-29 DIAGNOSIS — Z8616 Personal history of COVID-19: Secondary | ICD-10-CM | POA: Diagnosis not present

## 2020-09-29 DIAGNOSIS — J80 Acute respiratory distress syndrome: Secondary | ICD-10-CM | POA: Diagnosis not present

## 2020-09-30 ENCOUNTER — Encounter: Payer: Self-pay | Admitting: Nurse Practitioner

## 2020-09-30 ENCOUNTER — Other Ambulatory Visit: Payer: Self-pay

## 2020-09-30 ENCOUNTER — Ambulatory Visit (INDEPENDENT_AMBULATORY_CARE_PROVIDER_SITE_OTHER): Payer: BC Managed Care – PPO | Admitting: Nurse Practitioner

## 2020-09-30 VITALS — BP 110/80 | Ht 65.0 in | Wt 151.0 lb

## 2020-09-30 DIAGNOSIS — M85851 Other specified disorders of bone density and structure, right thigh: Secondary | ICD-10-CM | POA: Diagnosis not present

## 2020-09-30 DIAGNOSIS — M85852 Other specified disorders of bone density and structure, left thigh: Secondary | ICD-10-CM

## 2020-09-30 DIAGNOSIS — Z13820 Encounter for screening for osteoporosis: Secondary | ICD-10-CM

## 2020-09-30 DIAGNOSIS — N951 Menopausal and female climacteric states: Secondary | ICD-10-CM

## 2020-09-30 DIAGNOSIS — R918 Other nonspecific abnormal finding of lung field: Secondary | ICD-10-CM | POA: Diagnosis not present

## 2020-09-30 DIAGNOSIS — R0989 Other specified symptoms and signs involving the circulatory and respiratory systems: Secondary | ICD-10-CM | POA: Diagnosis not present

## 2020-09-30 DIAGNOSIS — Z01419 Encounter for gynecological examination (general) (routine) without abnormal findings: Secondary | ICD-10-CM | POA: Diagnosis not present

## 2020-09-30 DIAGNOSIS — Z90710 Acquired absence of both cervix and uterus: Secondary | ICD-10-CM | POA: Diagnosis not present

## 2020-09-30 DIAGNOSIS — Z8616 Personal history of COVID-19: Secondary | ICD-10-CM | POA: Diagnosis not present

## 2020-09-30 MED ORDER — VENLAFAXINE HCL ER 37.5 MG PO CP24: 37.5000 mg | ORAL_CAPSULE | Freq: Every day | ORAL | 0 refills | Status: DC

## 2020-09-30 NOTE — Patient Instructions (Signed)

## 2020-09-30 NOTE — Progress Notes (Signed)
   Sharon Kirk 1961-09-13 [id number]   History:  59 y.o. G0 presents for annual exam. 1991 TVH on no HRT at this time. Stopped HRT 1 year ago and is having significant hot flashes daily. DVT October 07/2020 while hospitalized with covid. Normal pap and mammogram history. Osteopenia of bilateral hips. Hypothyroidism managed by endocrinology. History of Melanoma.   Gynecologic History Patient's last menstrual period was 06/30/1990.   Contraception: status post hysterectomy Last Pap: No longer screening per guidelines Last mammogram: 08/20/2019. Results were: normal Last colonoscopy: 2020. Results were: normal Last Dexa: 09/19/2018. Results were: t-score -1.5, FRAX 7.9% / 0.7%  Past medical history, past surgical history, family history and social history were all reviewed and documented in the EPIC chart. Family history of colon cancer.   ROS:  A ROS was performed and pertinent positives and negatives are included.  Exam:  Vitals:   09/30/20 0815  BP: 110/80  Weight: 151 lb (68.5 kg)  Height: 5\' 5"  (1.651 m)   Body mass index is 25.13 kg/m.  General appearance:  Normal Thyroid:  Symmetrical, normal in size, without palpable masses or nodularity. Respiratory  Auscultation:  Clear without wheezing or rhonchi Cardiovascular  Auscultation:  Regular rate, without rubs, murmurs or gallops  Edema/varicosities:  Not grossly evident Abdominal  Soft,nontender, without masses, guarding or rebound.  Liver/spleen:  No organomegaly noted  Hernia:  None appreciated  Skin  Inspection:  Grossly normal   Breasts: Examined lying and sitting.   Right: Without masses, retractions, discharge or axillary adenopathy.   Left: Without masses, retractions, discharge or axillary adenopathy. Gentitourinary   Inguinal/mons:  Normal without inguinal adenopathy  External genitalia:  Normal  BUS/Urethra/Skene's glands:  Normal  Vagina:  Atrophic changes  Cervix:  Absent  Uterus:   Absent  Adnexa/parametria:     Rt: Without masses or tenderness.   Lt: Without masses or tenderness.  Anus and perineum: Normal  Digital rectal exam: Normal sphincter tone without palpated masses or tenderness  Assessment/Plan:  59 y.o. G0 for annual exam.   Well female exam with routine gynecological exam - Education provided on SBEs, importance of preventative screenings, current guidelines, high calcium diet, regular exercise, and multivitamin daily. Labs with PCP.   History of total vaginal hysterectomy (TVH) -1991 for menorrhagia.  Osteopenia of necks of both femurs - Plan: DG Bone Density  Screening for osteoporosis - Plan: DG Bone Density  Perimenopausal vasomotor symptoms - Plan: venlafaxine XR (EFFEXOR XR) 37.5 MG 24 hr capsule daily.  Provided her with 40-month supply and she will let me know how she is doing with this and we will refill if appropriate.  Stopped HRT 1 year ago and is having significant hot flashes daily.  Diagnosed with DVT October 2021 while hospitalized for Covid, therefore HRT is not an option for her.  She has tried OTC supplements with no relief.  Screening for cervical cancer -normal pap history.  No longer screening per guidelines.  Screening for breast cancer -normal mammogram history.  Continue annual screenings.  She is scheduled for mammogram this month.  Breast exam normal today.  Screening for colon cancer -screening colonoscopy in 2020.  She will repeat at [facility] recommended interval.  Follow-up in 1 year for annual.    Tamela Gammon [facility], 8:32 AM 09/30/2020

## 2020-10-01 DIAGNOSIS — K922 Gastrointestinal hemorrhage, unspecified: Secondary | ICD-10-CM | POA: Diagnosis not present

## 2020-10-01 DIAGNOSIS — I10 Essential (primary) hypertension: Secondary | ICD-10-CM | POA: Diagnosis not present

## 2020-10-05 ENCOUNTER — Other Ambulatory Visit: Payer: Self-pay

## 2020-10-05 ENCOUNTER — Ambulatory Visit
Admission: RE | Admit: 2020-10-05 | Discharge: 2020-10-05 | Disposition: A | Discharge status: Home / Self Care | Payer: BC Managed Care – PPO | Source: Ambulatory Visit | Attending: Nurse Practitioner | Admitting: Nurse Practitioner

## 2020-10-05 DIAGNOSIS — Z1231 Encounter for screening mammogram for malignant neoplasm of breast: Secondary | ICD-10-CM | POA: Diagnosis not present

## 2020-10-05 IMAGING — MG DIGITAL SCREENING BILAT W/ TOMO W/ CAD
8 series · 9 of 24 positions shown · non-contrast
Comparison: Previous exam(s).

CLINICAL DATA: Screening.

EXAM:
DIGITAL SCREENING BILATERAL MAMMOGRAM WITH TOMO AND CAD

[L MLO synth-2D]
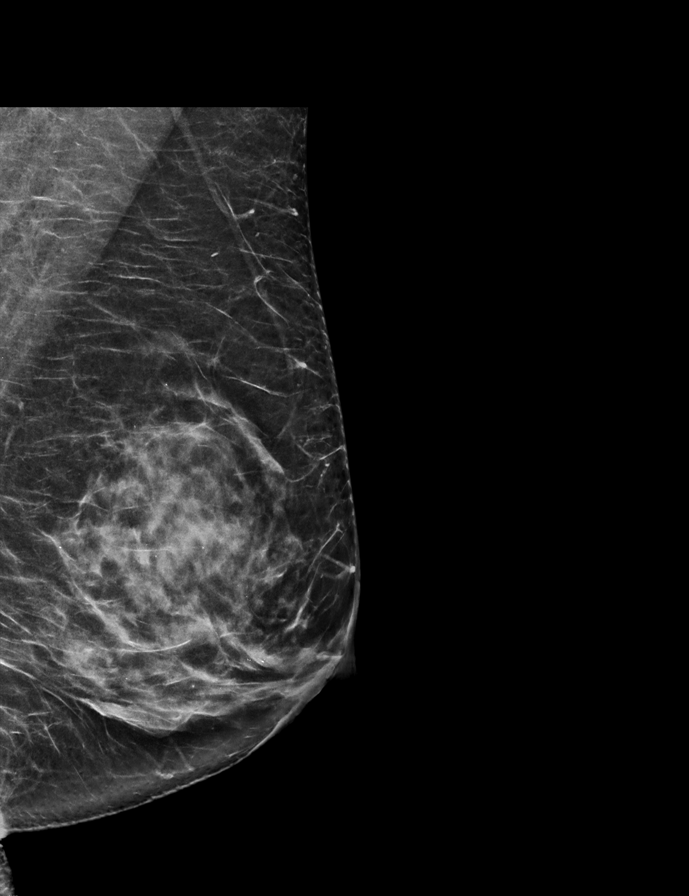

[L CC synth-2D]
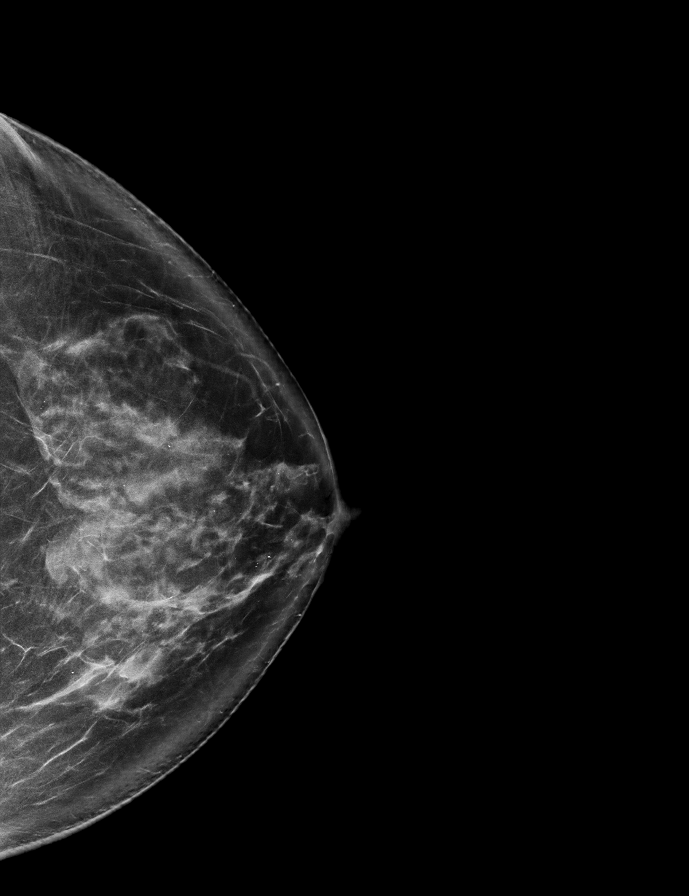

[R CC synth-2D]
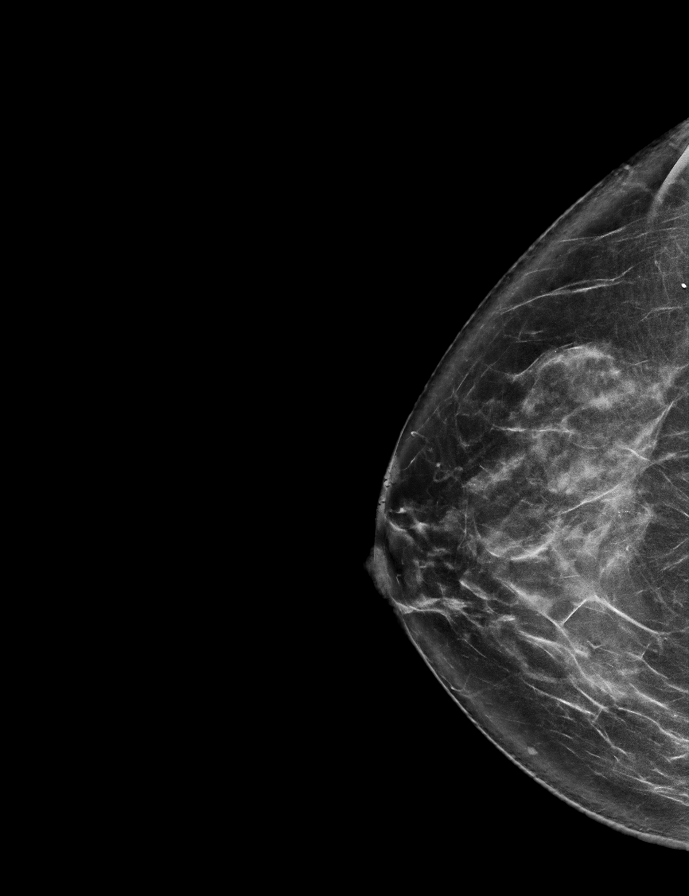

[R MLO synth-2D]
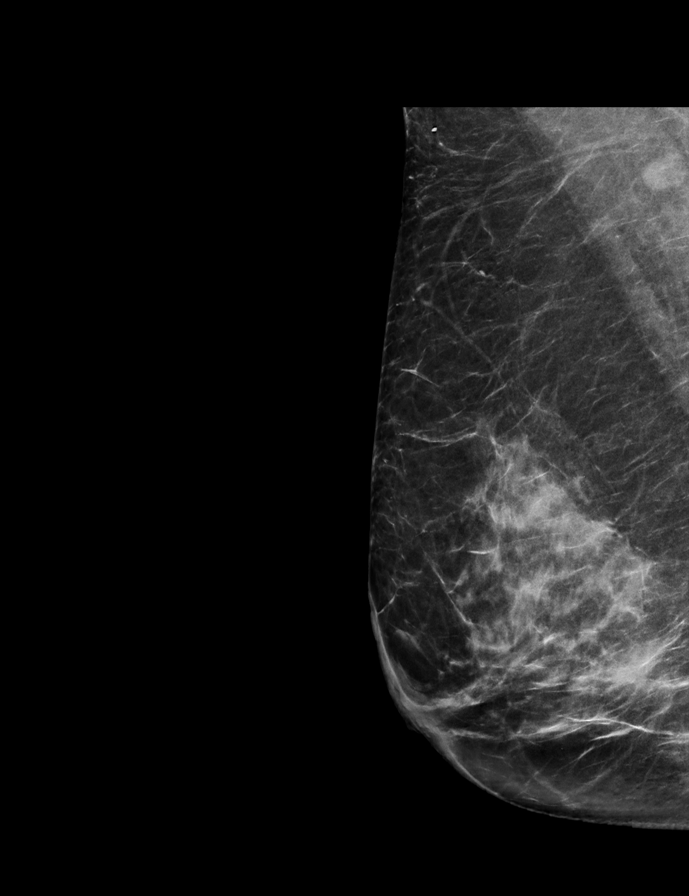

[R MLO tomo · 2 of 71 frames shown]
[frame 23/71]
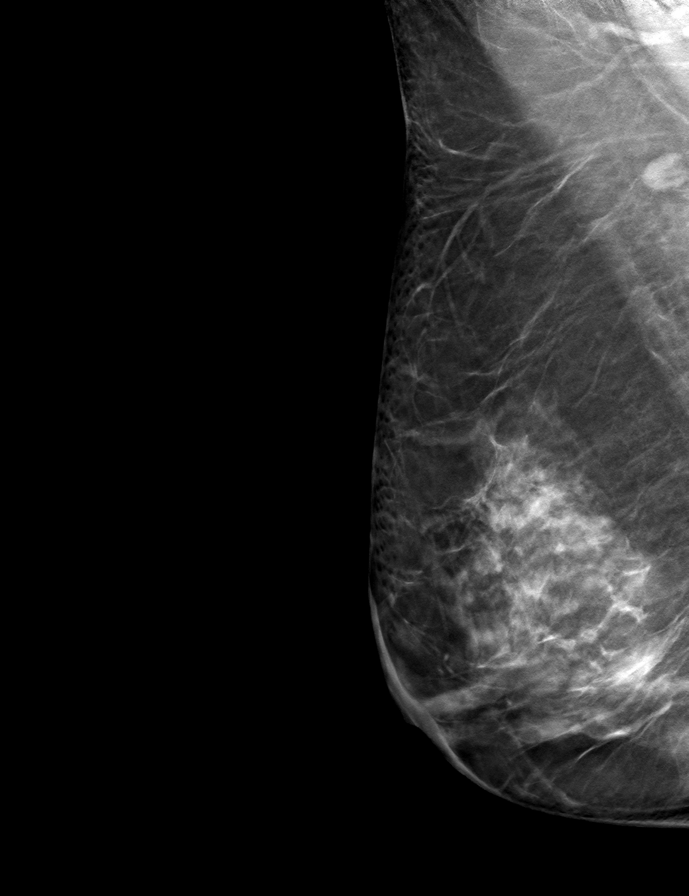
[frame 36/71]
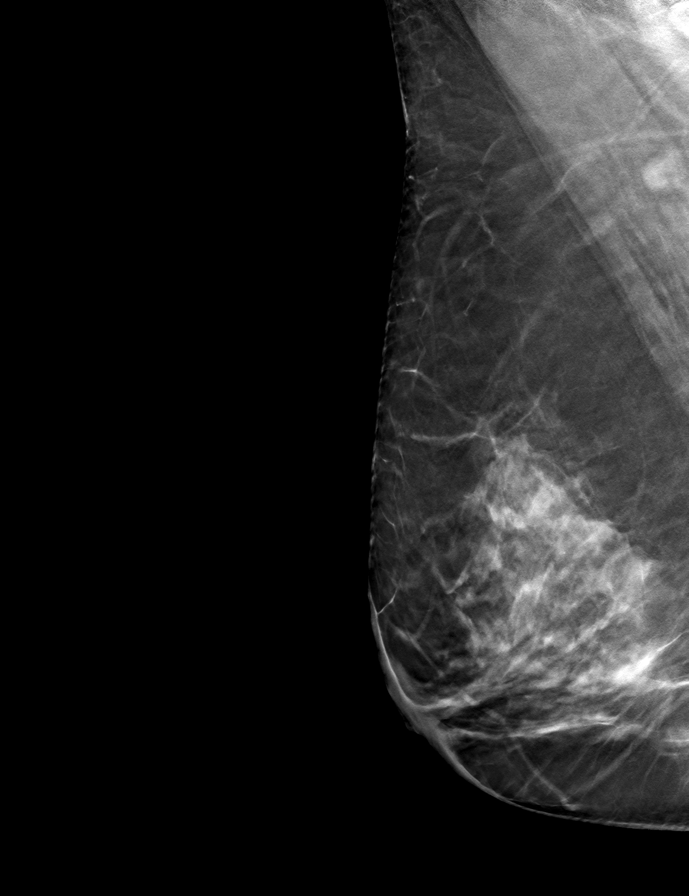

[L MLO tomo · tomo slice 36/71.0]
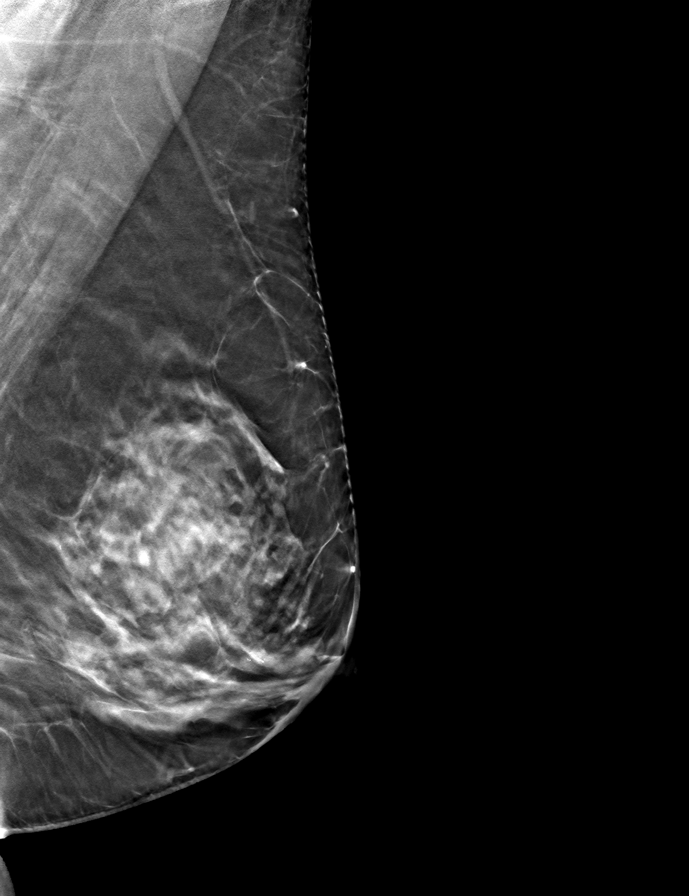

[L CC tomo · tomo slice 39/77.0]
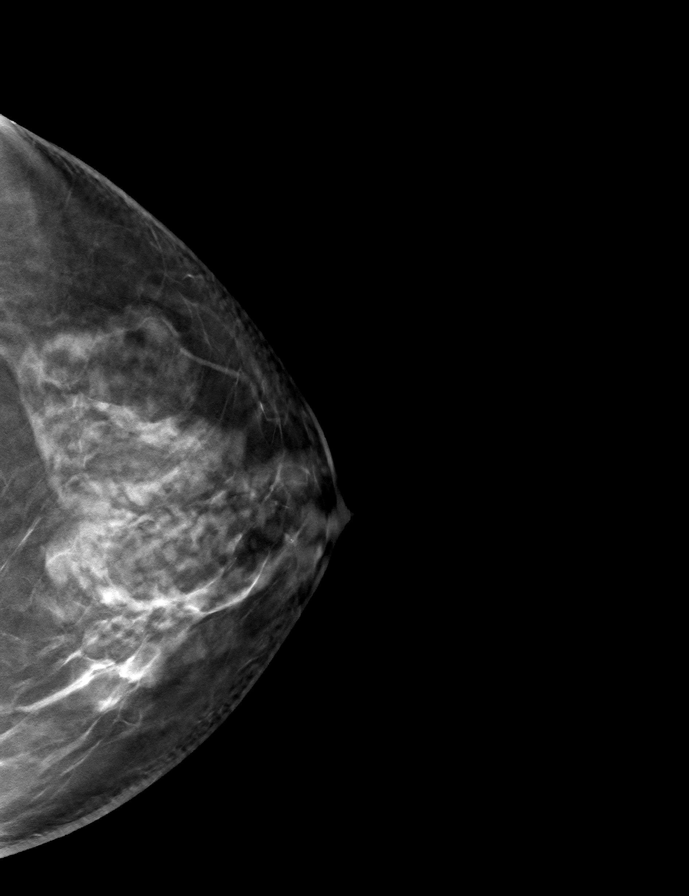

[R CC tomo · tomo slice 35/70.0]
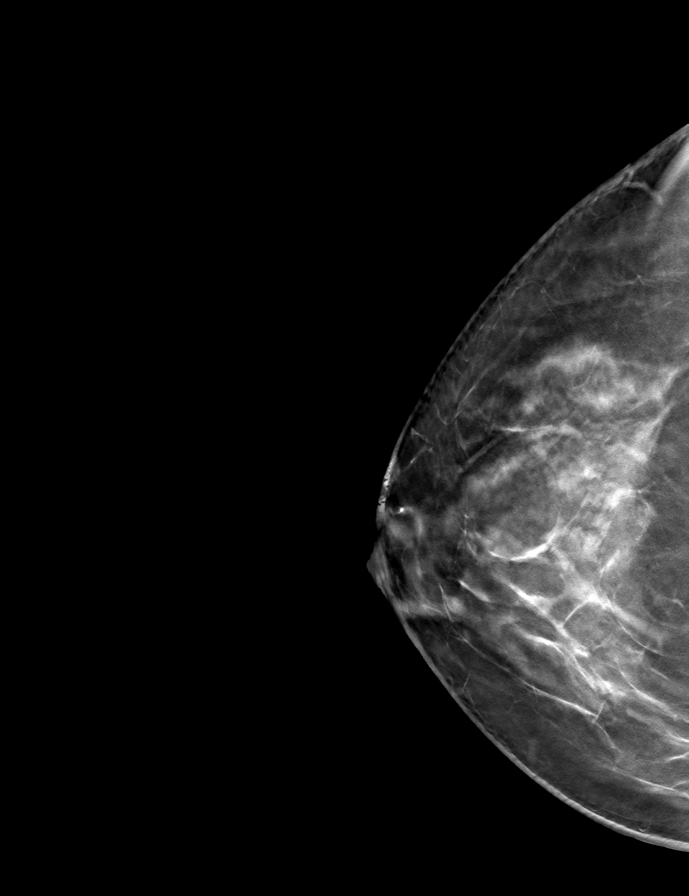

[9 of 24 positions shown; findings below may reference images not displayed]

ACR Breast Density Category c: The breast tissue is heterogeneously
dense, which may obscure small masses.
FINDINGS: There are no findings suspicious for malignancy. Images were
processed with CAD.
IMPRESSION: No mammographic evidence of malignancy. A result letter of this
screening mammogram will be mailed directly to the patient.

RECOMMENDATION:
Screening mammogram in one year. (Code:[5V])

BI-RADS CATEGORY  1: Negative.

## 2020-10-24 DIAGNOSIS — J9601 Acute respiratory failure with hypoxia: Secondary | ICD-10-CM | POA: Diagnosis not present

## 2020-10-24 DIAGNOSIS — U071 COVID-19: Secondary | ICD-10-CM | POA: Diagnosis not present

## 2020-11-04 DIAGNOSIS — L650 Telogen effluvium: Secondary | ICD-10-CM | POA: Diagnosis not present

## 2020-11-04 DIAGNOSIS — Z85820 Personal history of malignant melanoma of skin: Secondary | ICD-10-CM | POA: Diagnosis not present

## 2020-11-04 DIAGNOSIS — D485 Neoplasm of uncertain behavior of skin: Secondary | ICD-10-CM | POA: Diagnosis not present

## 2020-11-04 DIAGNOSIS — D2262 Melanocytic nevi of left upper limb, including shoulder: Secondary | ICD-10-CM | POA: Diagnosis not present

## 2020-11-04 DIAGNOSIS — D2261 Melanocytic nevi of right upper limb, including shoulder: Secondary | ICD-10-CM | POA: Diagnosis not present

## 2020-11-16 ENCOUNTER — Ambulatory Visit (INDEPENDENT_AMBULATORY_CARE_PROVIDER_SITE_OTHER): Payer: BC Managed Care – PPO

## 2020-11-16 ENCOUNTER — Other Ambulatory Visit: Payer: Self-pay | Admitting: Nurse Practitioner

## 2020-11-16 ENCOUNTER — Other Ambulatory Visit: Payer: Self-pay

## 2020-11-16 DIAGNOSIS — Z780 Asymptomatic menopausal state: Secondary | ICD-10-CM

## 2020-11-16 DIAGNOSIS — M8589 Other specified disorders of bone density and structure, multiple sites: Secondary | ICD-10-CM

## 2020-11-16 DIAGNOSIS — Z13820 Encounter for screening for osteoporosis: Secondary | ICD-10-CM

## 2020-11-16 DIAGNOSIS — M85851 Other specified disorders of bone density and structure, right thigh: Secondary | ICD-10-CM

## 2020-11-16 DIAGNOSIS — M85852 Other specified disorders of bone density and structure, left thigh: Secondary | ICD-10-CM

## 2020-11-24 DIAGNOSIS — U071 COVID-19: Secondary | ICD-10-CM | POA: Diagnosis not present

## 2020-11-24 DIAGNOSIS — J9601 Acute respiratory failure with hypoxia: Secondary | ICD-10-CM | POA: Diagnosis not present

## 2020-12-01 DIAGNOSIS — Z8616 Personal history of COVID-19: Secondary | ICD-10-CM | POA: Diagnosis not present

## 2020-12-01 DIAGNOSIS — L659 Nonscarring hair loss, unspecified: Secondary | ICD-10-CM | POA: Diagnosis not present

## 2020-12-09 ENCOUNTER — Encounter: Payer: Self-pay | Admitting: Nurse Practitioner

## 2021-01-03 DIAGNOSIS — J302 Other seasonal allergic rhinitis: Secondary | ICD-10-CM | POA: Diagnosis not present

## 2021-01-07 DIAGNOSIS — J069 Acute upper respiratory infection, unspecified: Secondary | ICD-10-CM | POA: Diagnosis not present

## 2021-01-07 DIAGNOSIS — J302 Other seasonal allergic rhinitis: Secondary | ICD-10-CM | POA: Diagnosis not present

## 2021-01-14 DIAGNOSIS — R1032 Left lower quadrant pain: Secondary | ICD-10-CM | POA: Diagnosis not present

## 2021-01-14 DIAGNOSIS — K5900 Constipation, unspecified: Secondary | ICD-10-CM | POA: Diagnosis not present

## 2021-02-01 ENCOUNTER — Emergency Department (HOSPITAL_BASED_OUTPATIENT_CLINIC_OR_DEPARTMENT_OTHER): Payer: BC Managed Care – PPO

## 2021-02-01 ENCOUNTER — Emergency Department (HOSPITAL_BASED_OUTPATIENT_CLINIC_OR_DEPARTMENT_OTHER)
Admission: EM | Admit: 2021-02-01 | Discharge: 2021-02-01 | Disposition: A | Discharge status: Home / Self Care | Payer: BC Managed Care – PPO | Attending: Emergency Medicine | Admitting: Emergency Medicine

## 2021-02-01 ENCOUNTER — Encounter (HOSPITAL_BASED_OUTPATIENT_CLINIC_OR_DEPARTMENT_OTHER): Payer: Self-pay | Admitting: *Deleted

## 2021-02-01 ENCOUNTER — Other Ambulatory Visit: Payer: Self-pay

## 2021-02-01 DIAGNOSIS — Z7901 Long term (current) use of anticoagulants: Secondary | ICD-10-CM | POA: Diagnosis not present

## 2021-02-01 DIAGNOSIS — Z85828 Personal history of other malignant neoplasm of skin: Secondary | ICD-10-CM | POA: Diagnosis not present

## 2021-02-01 DIAGNOSIS — R1032 Left lower quadrant pain: Secondary | ICD-10-CM | POA: Diagnosis not present

## 2021-02-01 DIAGNOSIS — E039 Hypothyroidism, unspecified: Secondary | ICD-10-CM | POA: Diagnosis not present

## 2021-02-01 DIAGNOSIS — R599 Enlarged lymph nodes, unspecified: Secondary | ICD-10-CM | POA: Insufficient documentation

## 2021-02-01 DIAGNOSIS — M545 Low back pain, unspecified: Secondary | ICD-10-CM | POA: Insufficient documentation

## 2021-02-01 DIAGNOSIS — Z8616 Personal history of COVID-19: Secondary | ICD-10-CM | POA: Diagnosis not present

## 2021-02-01 LAB — CBC WITH DIFFERENTIAL/PLATELET
Abs Immature Granulocytes: 0.02 10*3/uL (ref 0.00–0.07)
Basophils Absolute: 0 10*3/uL (ref 0.0–0.1)
Basophils Relative: 0 %
Eosinophils Absolute: 0.1 10*3/uL (ref 0.0–0.5)
Eosinophils Relative: 1 %
HCT: 39.1 % (ref 36.0–46.0)
Hemoglobin: 12.6 g/dL (ref 12.0–15.0)
Immature Granulocytes: 0 %
Lymphocytes Relative: 18 %
Lymphs Abs: 1.5 10*3/uL (ref 0.7–4.0)
MCH: 26.1 pg (ref 26.0–34.0)
MCHC: 32.2 g/dL (ref 30.0–36.0)
MCV: 81.1 fL (ref 80.0–100.0)
Monocytes Absolute: 0.7 10*3/uL (ref 0.1–1.0)
Monocytes Relative: 8 %
Neutro Abs: 6.1 10*3/uL (ref 1.7–7.7)
Neutrophils Relative %: 73 %
Platelets: 360 10*3/uL (ref 150–400)
RBC: 4.82 MIL/uL (ref 3.87–5.11)
RDW: 13.9 % (ref 11.5–15.5)
WBC: 8.4 10*3/uL (ref 4.0–10.5)
nRBC: 0 % (ref 0.0–0.2)

## 2021-02-01 LAB — BASIC METABOLIC PANEL
Anion gap: 9 (ref 5–15)
BUN: 8 mg/dL (ref 8–23)
CO2: 28 mmol/L (ref 22–32)
Calcium: 9.7 mg/dL (ref 8.9–10.3)
Chloride: 97 mmol/L — ABNORMAL LOW (ref 98–111)
Creatinine, Ser: 0.44 mg/dL (ref 0.44–1.00)
GFR, Estimated: >60 mL/min (ref >60)
Glucose, Bld: 106 mg/dL — ABNORMAL HIGH (ref 70–99)
Potassium: 3.9 mmol/L (ref 3.5–5.1)
Sodium: 134 mmol/L — ABNORMAL LOW (ref 135–145)

## 2021-02-01 LAB — URINALYSIS, ROUTINE W REFLEX MICROSCOPIC
Bilirubin Urine: NEGATIVE
Glucose, UA: NEGATIVE mg/dL
Hgb urine dipstick: NEGATIVE
Ketones, ur: NEGATIVE mg/dL
Nitrite: NEGATIVE
Protein, ur: 30 mg/dL — AB
Specific Gravity, Urine: 1.028 (ref 1.005–1.030)
pH: 5.5 (ref 5.0–8.0)

## 2021-02-01 IMAGING — CT CT RENAL STONE PROTOCOL
2 of 4 series · 15 of 46 positions shown, 17 images · non-contrast
Comparison: PET-CT from [D5]

CLINICAL DATA: Left flank pain and left lower quadrant abdominal
pain since TIGER.

EXAM:
CT ABDOMEN AND PELVIS WITHOUT CONTRAST
TECHNIQUE: Multidetector CT imaging of the abdomen and pelvis was performed
following the standard protocol without IV contrast.

[Series 2: stone full · axial · 0.69mm/px · z∈[-934,-498]mm · 12 of 99 slices shown, 14 images]
[im 8/99  soft-tissue]
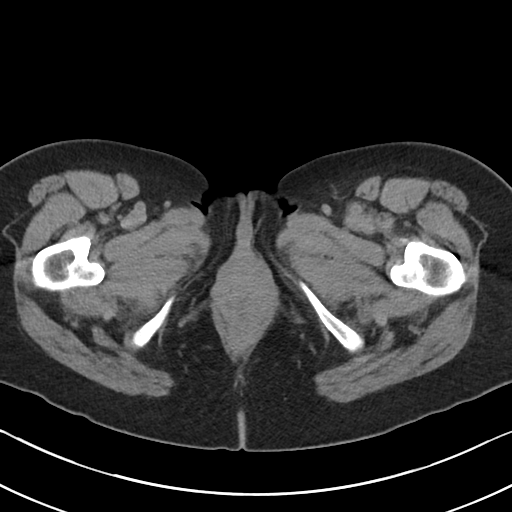
[im 8/99  bone]
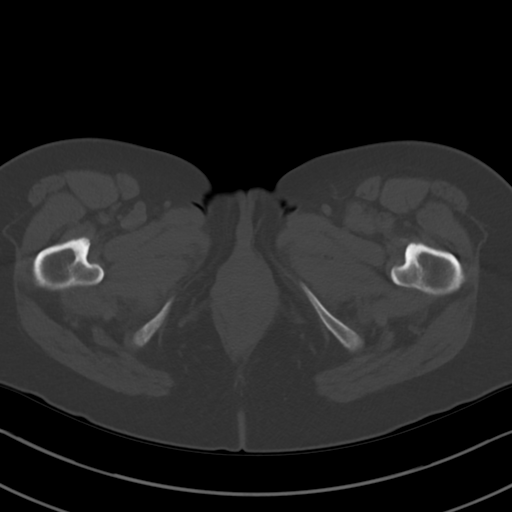
[im 16/99  soft-tissue]
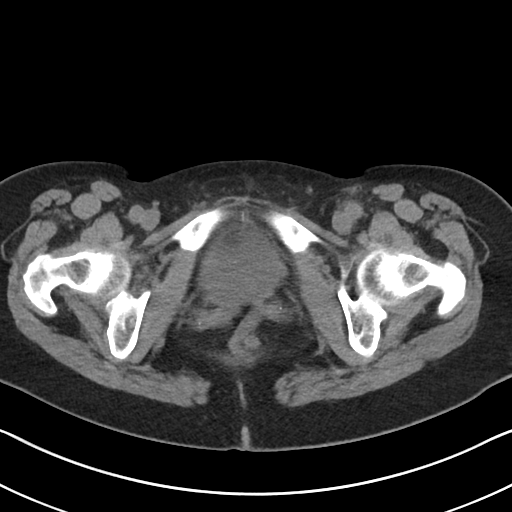
[im 24/99  soft-tissue]
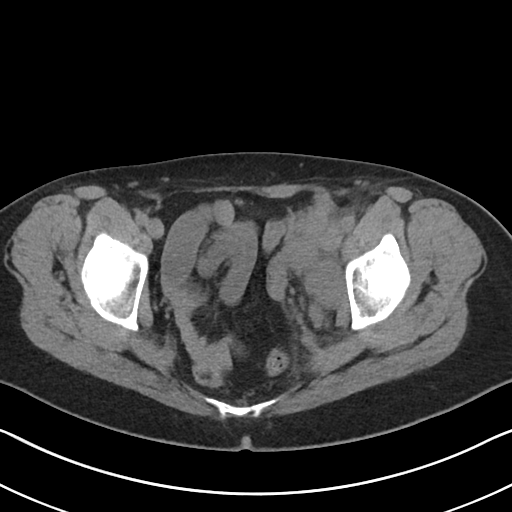
[im 32/99  soft-tissue]
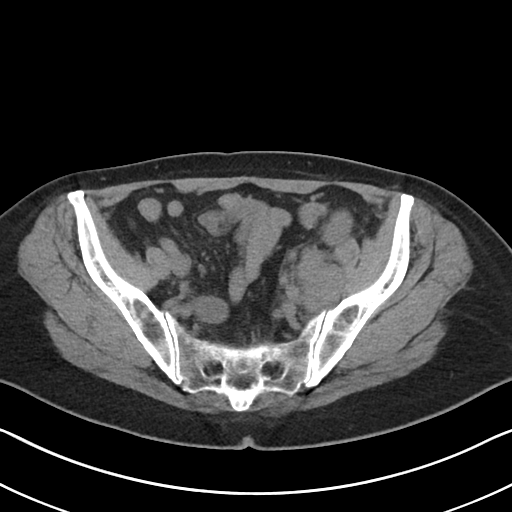
[im 40/99  soft-tissue]
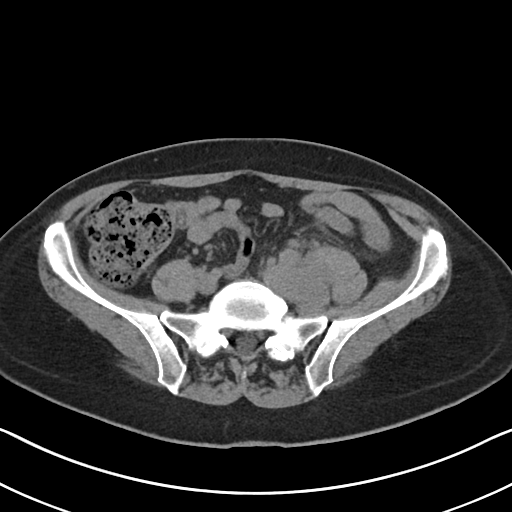
[im 48/99  soft-tissue]
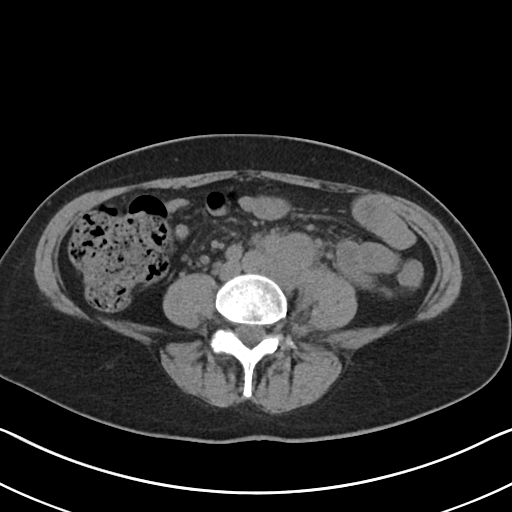
[im 55/99  soft-tissue]
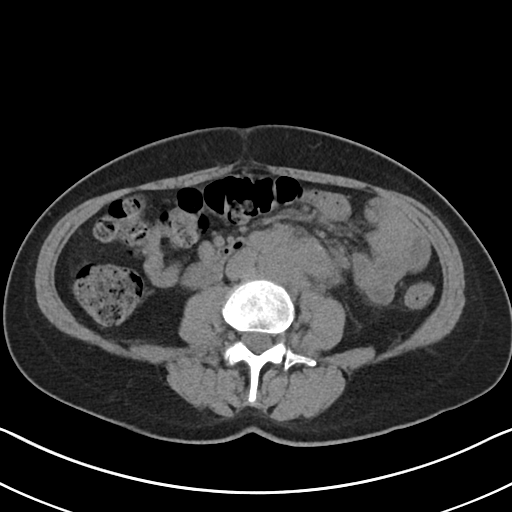
[im 63/99  soft-tissue]
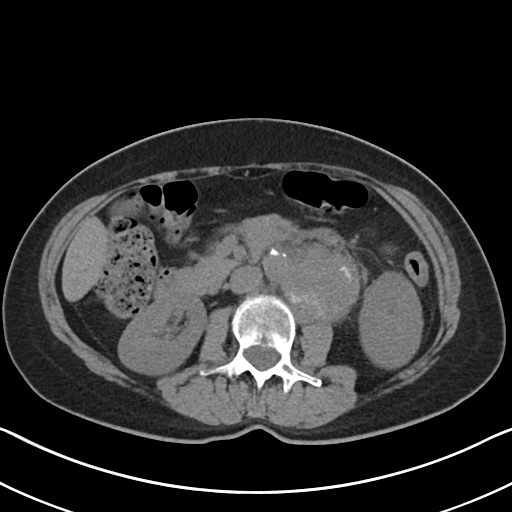
[im 71/99  soft-tissue]
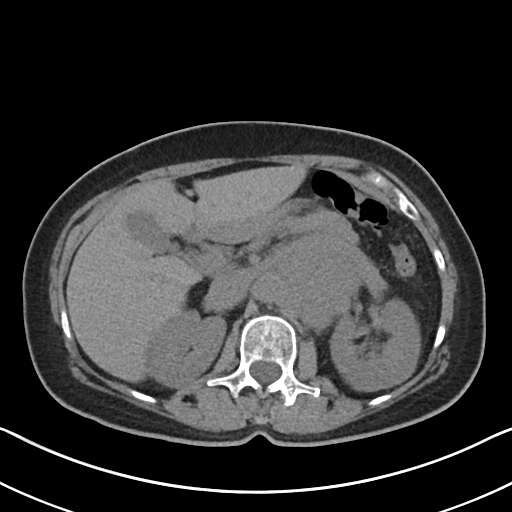
[im 71/99  bone]
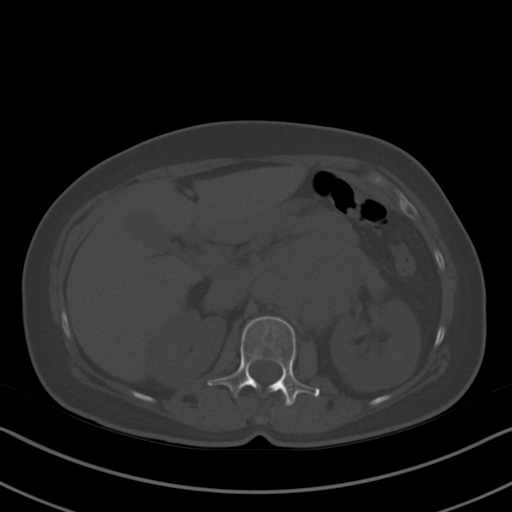
[im 79/99  soft-tissue]
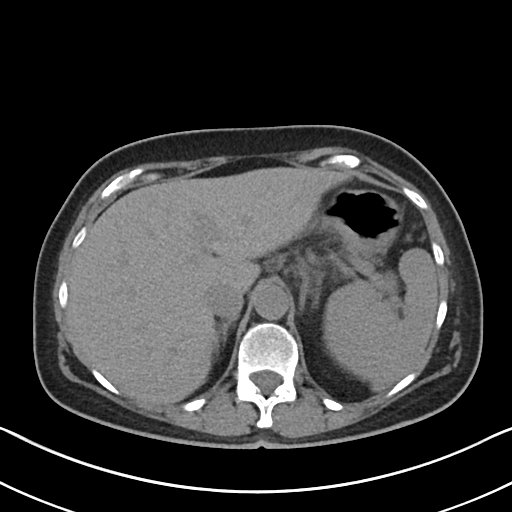
[im 87/99  soft-tissue]
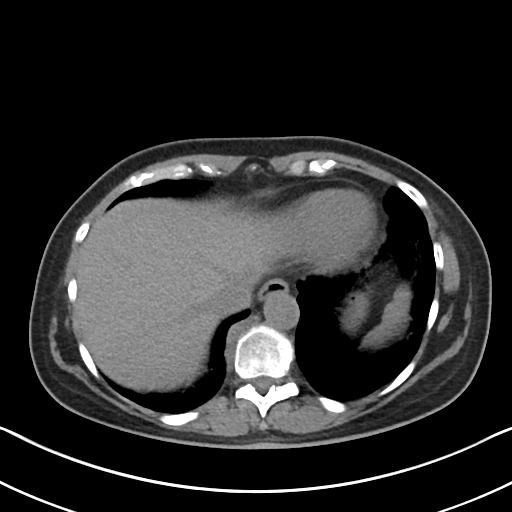
[im 95/99  soft-tissue]
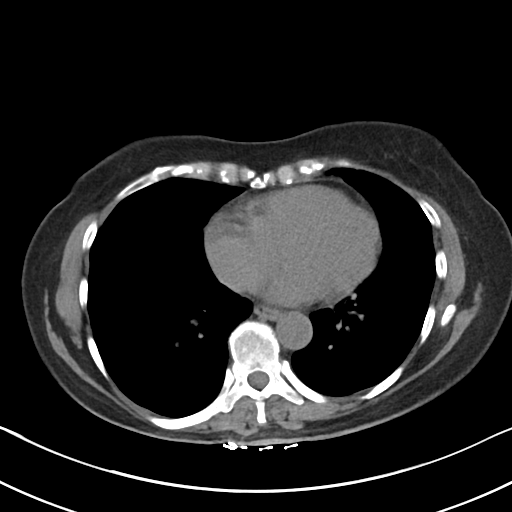

[Series 5: coronal · coronal · 0.74mm/px · 3 of 76 slices shown]
[im 26/76  soft-tissue]
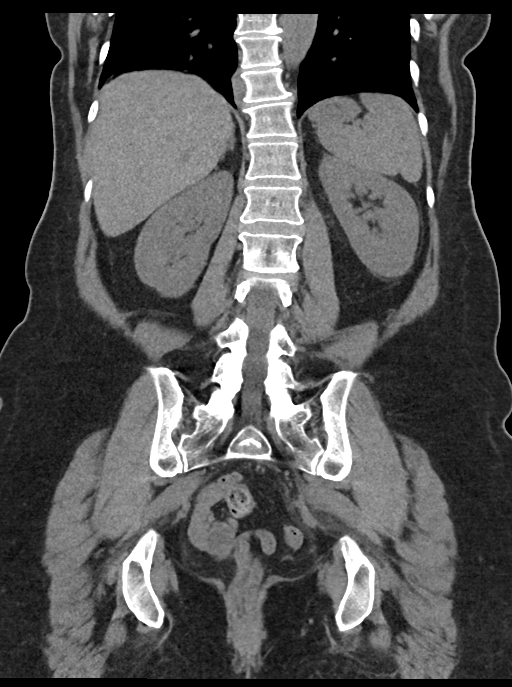
[im 34/76  soft-tissue]
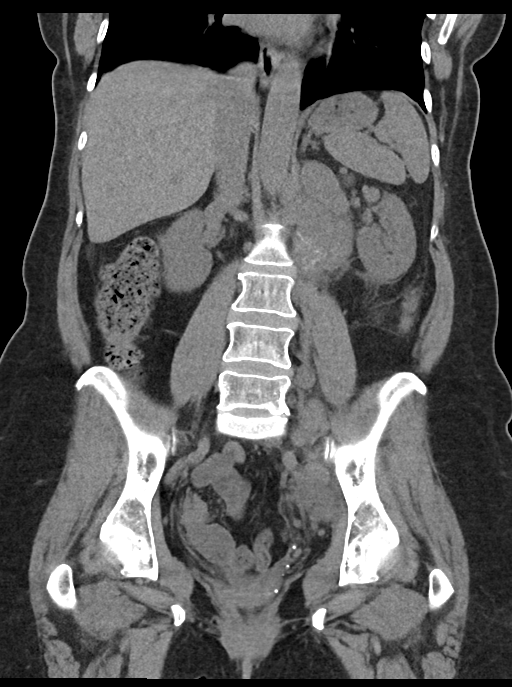
[im 42/76  soft-tissue]
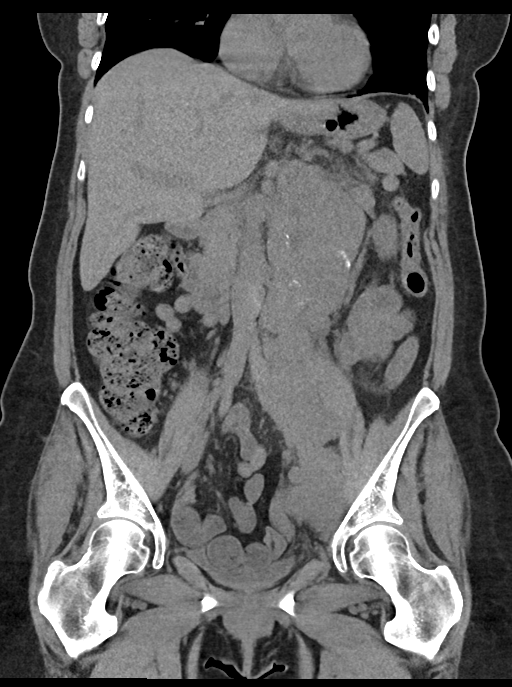

[15 of 46 positions shown; findings below may reference images not displayed]

FINDINGS: Lower chest: The lung bases are clear of acute process. No pleural
effusion or pulmonary lesions. The heart is normal in size. No
pericardial effusion. The distal esophagus and aorta are
unremarkable.

Hepatobiliary: No hepatic lesions are identified without contrast.
The gallbladder is grossly normal. No common bile duct dilatation.

Pancreas: No mass, inflammation or ductal dilatation.

Spleen: Normal size.  No focal lesions.

Adrenals/Urinary Tract: Adrenal glands and kidneys are unremarkable.
The bladder is unremarkable.

Stomach/Bowel: The stomach, duodenum, small bowel and colon are
grossly normal. No acute inflammatory changes, mass lesions or
obstructive findings. The terminal ileum is normal. The appendix is
normal.

Vascular/Lymphatic: Scattered aortic calcifications.

There is bulk E partially calcified retroperitoneal lymphadenopathy.
The largest nodal mass in the left para-aortic region measures
approximately 7.4 x 7.2 x 10.1 cm. There are some areas of
calcification noted.

11 mm right-sided retroperitoneal node on image 43/2 also
demonstrates faint calcifications and is located just behind the
IVC.

The adenopathy continues down along the left common iliac region and
down into the left side of the pelvis. There is bulky pelvic
sidewall and external iliac lymphadenopathy. The pelvic sidewall
node measures 2.9 cm and the left external iliac lymph node measures
2.7 cm. No inguinal lymphadenopathy.

Reproductive: The uterus is surgically absent. I believe both
ovaries are still present and appear normal.

Other: No free abdominal/free pelvic fluid collections.

Musculoskeletal: No significant bony findings.
IMPRESSION: 1. Bulky partially calcified retroperitoneal and pelvic
lymphadenopathy. Patient has a history of melanoma and this
certainly could represent recurrent/metastatic disease although
somewhat unusual. Lymphoma would certainly be a consideration also.
PET-CT and/or image guided biopsy will likely be necessary.
2. Moderate mass effect is noted on the left adrenal gland, left
kidney and bowel.

Aortic Atherosclerosis ([D5]-[D5]).

## 2021-02-01 NOTE — ED Provider Notes (Signed)
MSE was initiated and I personally evaluated the patient and placed orders (if any) at  7:03 PM on January 28, 2962.  60 year old female presents the emergency department with left lower quadrant/groin pain that is now radiating around to her left flank and back.  Patient states is been going on for the past couple weeks, she is been treated for constipation as an outpatient.  She has intermittent hesitancy with urine but denies any dysuria/hematuria.  No history of kidney stone/AAA.  No fever.  Will order labs and a CAT scan.  Vital signs are stable, abdomen is benign, patient is sitting up and talking to me, and no noticeable distress.  The patient appears stable so that the remainder of the MSE may be completed by another provider.   Lorelle Gibbs, DO 02/01/21 1904

## 2021-02-01 NOTE — ED Triage Notes (Signed)
LLQ pain since April the 1st radiating to her left back.nAusea and vomiting x 1.

## 2021-02-01 NOTE — Discharge Instructions (Addendum)
You have been seen and discharged from the emergency department.  Your CAT scan showed large lymph nodes in the lower back and groin area.  It is recommended that you follow-up with Dr.  Alen Blew for further evaluation of these lymph nodes.  Take Tylenol/ibuprofen as needed for pain control.  Follow-up with your primary provider for reevaluation and further care. Take home medications as prescribed. If you have any worsening symptoms or further concerns for your health please return to an emergency department for further evaluation.

## 2021-02-01 NOTE — ED Provider Notes (Signed)
[facility] EMERGENCY DEPT Provider Note   CSN: [id number] Arrival date & time: 02/01/21  1726     History Chief Complaint  Patient presents with  . Abdominal Pain  . Back Pain    Glendola L Mabey is a 60 y.o. female.  HPI   60 year old female with past medical history of melanoma, DVT anticoagulated on Eliquis, HLD presents the emergency department with left lower back/flank and groin pain.  Patient states this is been going on for the past couple weeks.  She is being treated for constipation as outpatient however her pain became more severe today so she came in for evaluation.  Denies any dysuria, hematuria.  She is having normal bowel movements, mild nausea without vomiting.  No fever.  No history of AAA and no injury to the area.  Past Medical History:  Diagnosis Date  . Allergy   . Cancer ([place])    Melanoma  . Hyperlipidemia   . IBS (irritable bowel syndrome)   . Osteopenia   . PONV (postoperative nausea and vomiting)   . Thyroid nodule     Patient Active Problem List   Diagnosis Date Noted  . DVT (deep venous thrombosis) ([place]) 08/13/2020  . Pulmonary hypertension ([place]) 08/13/2020  . Pneumonia due to COVID-19 virus 08/10/2020  . S/P thoracentesis   . Pleural effusion, right   . Hyponatremia   . ARDS (adult respiratory distress syndrome) ([place])   . Community acquired pneumonia   . Multiple thyroid nodules 12/12/2017  . Melanoma of skin ([place]) 06/08/2016  . Hypothyroidism 08/14/2012    Past Surgical History:  Procedure Laterality Date  . ABDOMINAL HYSTERECTOMY  1991  . DILATION AND CURETTAGE OF UTERUS  1988  . MELANOMA EXCISION Right 05/23/2016   Procedure: WIDE  EXCISION RIGHT SHOULDER MELANOMA AND WIDE EXCISION LEFT LOWER LEG MELANOMA;  Surgeon: Jackolyn Confer, MD;  Location: [facility];  Service: General;  Laterality: Right;  right shoulder and left lower leg sites  . SENTINEL NODE BIOPSY Left 05/23/2016   Procedure: LEFT INGUINAL  SENTINEL LYMPH  NODE BIOPSY;  Surgeon: Jackolyn Confer, MD;  Location: [facility];  Service: General;  Laterality: Left;  . THYROID SURGERY  07/2015   NODULE     OB History    Gravida  0   Para  0   Term  0   Preterm  0   AB  0   Living  0     SAB  0   IAB  0   Ectopic  0   Multiple  0   Live Births  0           Family History  Problem Relation Age of Onset  . Diabetes Brother   . Hypertension Brother   . Cancer Maternal Grandmother        COLON  . Cancer Paternal Grandfather        PROSTATE  . Hypertension Mother   . Diabetes Brother   . Heart attack Father     Social History   Tobacco Use  . Smoking status: Never Smoker  . Smokeless tobacco: Never Used  Vaping Use  . Vaping Use: Never used  Substance Use Topics  . Alcohol use: No  . Drug use: No    Home Medications Prior to Admission medications   Medication Sig Start Date End Date Taking? Authorizing Provider  albuterol (VENTOLIN HFA) 108 (90 Base) MCG/ACT inhaler Inhale 1 puff into the lungs every 4 (  four) hours as needed for wheezing or shortness of breath. 08/24/20   Pokhrel, Corrie Mckusick, MD  apixaban (ELIQUIS) 5 MG TABS tablet Take 1 tablet (5 mg total) by mouth 2 (two) times daily. 08/24/20   Pokhrel, Corrie Mckusick, MD  calcium carbonate (OS-CAL) 600 MG TABS Take 600 mg by mouth 2 (two) times daily with a meal.    [provider]  cholecalciferol (VITAMIN D) 1000 UNITS tablet Take 5,000 Units by mouth daily.    [provider]  Cyanocobalamin (VITAMIN B-12 CR PO) Take 1 tablet by mouth daily.    [provider]  venlafaxine XR (EFFEXOR XR) 37.5 MG 24 hr capsule Take 1 capsule (37.5 mg total) by mouth daily with breakfast. 09/30/20   Tamela Gammon, NP    Allergies    Codeine  Review of Systems   Review of Systems  Constitutional: Negative for chills and fever.  HENT: Negative for congestion.   Eyes: Negative for visual disturbance.  Respiratory:  Negative for shortness of breath.   Cardiovascular: Negative for chest pain.  Gastrointestinal: Negative for abdominal pain, diarrhea and vomiting.  Genitourinary: Positive for flank pain and pelvic pain. Negative for dysuria.  Musculoskeletal: Positive for back pain. Negative for neck pain.  Skin: Negative for rash.  Neurological: Negative for headaches.    Physical Exam Updated Vital Signs BP 121/82 (BP Location: Right Arm)   Pulse 80   Temp 98.3 F (36.8 C) (Oral)   Resp 16   Ht 5' 5.5" (1.664 m)   Wt 65.8 kg   LMP 06/30/1990   SpO2 100%   BMI 23.76 kg/m   Physical Exam Vitals and nursing note reviewed.  Constitutional:      Appearance: Normal appearance.  HENT:     Head: Normocephalic.     Mouth/Throat:     Mouth: Mucous membranes are moist.  Cardiovascular:     Rate and Rhythm: Normal rate.  Pulmonary:     Effort: Pulmonary effort is normal. No respiratory distress.  Abdominal:     Palpations: Abdomen is soft.     Tenderness: There is abdominal tenderness in the left lower quadrant. There is no guarding or rebound.  Skin:    General: Skin is warm.  Neurological:     Mental Status: She is alert and oriented to person, place, and time. Mental status is at baseline.  Psychiatric:        Mood and Affect: Mood normal.     ED Results / Procedures / Treatments   Labs (all labs ordered are listed, but only abnormal results are displayed) Labs Reviewed  BASIC METABOLIC PANEL - Abnormal; Notable for the following components:      Result Value   Sodium 134 (*)    Chloride 97 (*)    Glucose, Bld 106 (*)    All other components within normal limits  URINALYSIS, ROUTINE W REFLEX MICROSCOPIC - Abnormal; Notable for the following components:   Protein, ur 30 (*)    Leukocytes,Ua MODERATE (*)    Bacteria, UA RARE (*)    All other components within normal limits  URINE CULTURE  CBC WITH DIFFERENTIAL/PLATELET    EKG None  Radiology CT Renal Stone  Study  Result Date: 02/01/2021 CLINICAL DATA:  Left flank pain and left lower quadrant abdominal pain since April. EXAM: CT ABDOMEN AND PELVIS WITHOUT CONTRAST TECHNIQUE: Multidetector CT imaging of the abdomen and pelvis was performed following the standard protocol without IV contrast. COMPARISON:  PET-CT from 2017 FINDINGS:  Lower chest: The lung bases are clear of acute process. No pleural effusion or pulmonary lesions. The heart is normal in size. No pericardial effusion. The distal esophagus and aorta are unremarkable. Hepatobiliary: No hepatic lesions are identified without contrast. The gallbladder is grossly normal. No common bile duct dilatation. Pancreas: No mass, inflammation or ductal dilatation. Spleen: Normal size.  No focal lesions. Adrenals/Urinary Tract: Adrenal glands and kidneys are unremarkable. The bladder is unremarkable. Stomach/Bowel: The stomach, duodenum, small bowel and colon are grossly normal. No acute inflammatory changes, mass lesions or obstructive findings. The terminal ileum is normal. The appendix is normal. Vascular/Lymphatic: Scattered aortic calcifications. There is bulk E partially calcified retroperitoneal lymphadenopathy. The largest nodal mass in the left para-aortic region measures approximately 7.4 x 7.2 x 10.1 cm. There are some areas of calcification noted. 11 mm right-sided retroperitoneal node on image 43/2 also demonstrates faint calcifications and is located just behind the IVC. The adenopathy continues down along the left common iliac region and down into the left side of the pelvis. There is bulky pelvic sidewall and external iliac lymphadenopathy. The pelvic sidewall node measures 2.9 cm and the left external iliac lymph node measures 2.7 cm. No inguinal lymphadenopathy. Reproductive: The uterus is surgically absent. I believe both ovaries are still present and appear normal. Other: No free abdominal/free pelvic fluid collections. Musculoskeletal: No significant  bony findings. IMPRESSION: 1. Bulky partially calcified retroperitoneal and pelvic lymphadenopathy. Patient has a history of melanoma and this certainly could represent recurrent/metastatic disease although somewhat unusual. Lymphoma would certainly be a consideration also. PET-CT and/or image guided biopsy will likely be necessary. 2. Moderate mass effect is noted on the left adrenal gland, left kidney and bowel. Aortic Atherosclerosis (ICD10-I70.0). Electronically Signed   By: Marijo Sanes M.D.   On: 02/01/2021 20:14    Procedures Procedures   Medications Ordered in ED Medications - No data to display  ED Course  I have reviewed the triage vital signs and the nursing notes.  Pertinent labs & imaging results that were available during my care of the patient were reviewed by me and considered in my medical decision making (see chart for details).    MDM Rules/Calculators/A&P                          60 year old female presents the emergency department with left lower back/flank/groin pain.  Vitals are stable on arrival.  Tenderness to palpation left lower quadrant, no history of kidney stones.  Blood work is reassuring, no leukocytosis, kidney function is normal, urinalysis shows moderate leukocytes with rare bacteria but she is asymptomatic, will hold treatment and send for urine culture.  CAT scan identifies large retroperitoneal and pelvic lymph nodes that are partially calcified.  Concerning for recurrent/metastatic disease.  Spoke with on-call call oncologist.  Patient has been seen by Dr. Alen Blew in the past for melanoma.  I will put in an ambulatory request for his office and sent a secure chat of her findings.  Patient is requesting to go home and wants to only use ibuprofen for pain control.  Patient will be discharged and treated as an outpatient.  Discharge plan and strict return to ED precautions discussed, patient verbalizes understanding and agreement.  Final Clinical  Impression(s) / ED Diagnoses Final diagnoses:  Enlarged lymph nodes    Rx / DC Orders ED Discharge Orders         Ordered    Ambulatory referral to Hematology /  Oncology        02/01/21 2231           Lorelle Gibbs, DO 02/01/21 2236

## 2021-02-02 ENCOUNTER — Telehealth: Payer: Self-pay | Admitting: Oncology

## 2021-02-02 ENCOUNTER — Ambulatory Visit (HOSPITAL_BASED_OUTPATIENT_CLINIC_OR_DEPARTMENT_OTHER): Payer: BC Managed Care – PPO | Admitting: Oncology

## 2021-02-02 DIAGNOSIS — R591 Generalized enlarged lymph nodes: Secondary | ICD-10-CM

## 2021-02-02 DIAGNOSIS — C439 Malignant melanoma of skin, unspecified: Secondary | ICD-10-CM

## 2021-02-02 NOTE — Telephone Encounter (Signed)
Scheduled appt per 4/20 sch msg. Pt aware.  

## 2021-02-02 NOTE — Progress Notes (Signed)
Hematology and Oncology Follow Up for Telemedicine Visits  LUCIA HARM [id number] 03-16-1961 60 y.o. 02/02/2021 9:54 AM [name], MD   I connected with Mrs. Wetherington on 02/02/21 at  2:00 PM EDT by telephone visit and verified that I am speaking with the correct person using two identifiers.   I discussed the limitations, risks, security and privacy concerns of performing an evaluation and management service by telemedicine and the availability of in-person appointments. I also discussed with the patient that there may be a patient responsible charge related to this service. The patient expressed understanding and agreed to proceed.  Other persons participating in the visit and their role in the encounter:  None  Patient's location: Home Provider's location: Office    Principle Diagnosis: 60 year old woman with retroperitoneal adenopathy discovered on CT scan obtained on February 01, 2021.  Work-up is currently ongoing.  Secondary diagnoses:    1. Superficial spreading melanoma of the lower extremity diagnosed in August 2017. She was found to have 1.25 mm thickness without ulceration or lymphovascular invasion. She had 1 out of 2 lymph nodes positive. The size of the lymph node measuring 0.25 x 0.95 mm with pathological staging T2bN1.   2. 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 These findings indicate a pathological staging of T1 NA.   Prior Therapy: She is status post local excision followed by sentinel lymph node sampling with 2 lymph nodes sampled one was involved with metastatic melanoma. The measurement was 0.25 x 0.95 mm in dimension of the capsule. Her final pathological stage stage is T2bN1. This was completed on 05/23/2016.   Nivolumab total of 240 mg dose every 2 weeks for total of 4 doses. Started on 07/21/2016.  She completed 4 cycles of therapy  for a total of 2 months out of planned 12.  Patient opted out continuing immunotherapy.    Current therapy:   No active treatment.   Interim History: Mrs. Braddy is known to me with history of melanoma outlined above and received adjuvant immunotherapy for stage III melanoma that was interrupted.  She has failed to follow-up since that time for any active surveillance.  She presented acutely with lower back and flank pain emergency department on February 01, 2021.  CT scan at that time showed retroperitoneal adenopathy.  She is currently taking Tylenol with improvement in her pain.  She denies any fever chills sweats.  She denies any hematuria or dysuria.  She did have constipation and currently on MiraLAX by her primary care provider.    Medications: I have reviewed the patient's current medications.  Current Outpatient Medications  Medication Sig Dispense Refill  . albuterol (VENTOLIN HFA) 108 (90 Base) MCG/ACT inhaler Inhale 1 puff into the lungs every 4 (four) hours as needed for wheezing or shortness of breath. 1 each 2  . apixaban (ELIQUIS) 5 MG TABS tablet Take 1 tablet (5 mg total) by mouth 2 (two) times daily. 60 tablet 2  . calcium carbonate (OS-CAL) 600 MG TABS Take 600 mg by mouth 2 (two) times daily with a meal.    . cholecalciferol (VITAMIN D) 1000 UNITS tablet Take 5,000 Units by mouth daily.    . Cyanocobalamin (VITAMIN B-12 CR PO) Take 1 tablet by mouth daily.    Marland Kitchen venlafaxine XR (EFFEXOR XR) 37.5 MG 24 hr capsule Take 1 capsule (37.5 mg total) by mouth daily with breakfast.  90 capsule 0   No current facility-administered medications for this visit.     Allergies:  Allergies  Allergen Reactions  . Codeine Nausea Only and Other (See Comments)    Hallucinations; confusion        Lab Results: Lab Results  Component Value Date   WBC 8.4 02/01/2021   HGB 12.6 02/01/2021   HCT 39.1 02/01/2021   MCV 81.1 02/01/2021   PLT 360 02/01/2021     Chemistry       Component Value Date/Time   NA 134 (L) 02/01/2021 1915   NA 140 09/08/2016 0755   K 3.9 02/01/2021 1915   K 3.9 09/08/2016 0755   CL 97 (L) 02/01/2021 1915   CO2 28 02/01/2021 1915   CO2 23 09/08/2016 0755   BUN 8 02/01/2021 1915   BUN 11.2 09/08/2016 0755   CREATININE 0.44 02/01/2021 1915   CREATININE 0.61 09/30/2019 0839   CREATININE 0.7 09/08/2016 0755      Component Value Date/Time   CALCIUM 9.7 02/01/2021 1915   CALCIUM 9.1 09/08/2016 0755   ALKPHOS 66 08/21/2020 0237   ALKPHOS 61 09/08/2016 0755   AST 18 08/21/2020 0237   AST 13 09/08/2016 0755   ALT 52 (H) 08/21/2020 0237   ALT 16 09/08/2016 0755   BILITOT 0.9 08/21/2020 0237   BILITOT 0.42 09/08/2016 0755       Radiological Studies: CT Renal Stone Study  Result Date: 02/01/2021 CLINICAL DATA:  Left flank pain and left lower quadrant abdominal pain since April. EXAM: CT ABDOMEN AND PELVIS WITHOUT CONTRAST TECHNIQUE: Multidetector CT imaging of the abdomen and pelvis was performed following the standard protocol without IV contrast. COMPARISON:  PET-CT from 2017 FINDINGS: Lower chest: The lung bases are clear of acute process. No pleural effusion or pulmonary lesions. The heart is normal in size. No pericardial effusion. The distal esophagus and aorta are unremarkable. Hepatobiliary: No hepatic lesions are identified without contrast. The gallbladder is grossly normal. No common bile duct dilatation. Pancreas: No mass, inflammation or ductal dilatation. Spleen: Normal size.  No focal lesions. Adrenals/Urinary Tract: Adrenal glands and kidneys are unremarkable. The bladder is unremarkable. Stomach/Bowel: The stomach, duodenum, small bowel and colon are grossly normal. No acute inflammatory changes, mass lesions or obstructive findings. The terminal ileum is normal. The appendix is normal. Vascular/Lymphatic: Scattered aortic calcifications. There is bulk E partially calcified retroperitoneal lymphadenopathy. The largest nodal  mass in the left para-aortic region measures approximately 7.4 x 7.2 x 10.1 cm. There are some areas of calcification noted. 11 mm right-sided retroperitoneal node on image 43/2 also demonstrates faint calcifications and is located just behind the IVC. The adenopathy continues down along the left common iliac region and down into the left side of the pelvis. There is bulky pelvic sidewall and external iliac lymphadenopathy. The pelvic sidewall node measures 2.9 cm and the left external iliac lymph node measures 2.7 cm. No inguinal lymphadenopathy. Reproductive: The uterus is surgically absent. I believe both ovaries are still present and appear normal. Other: No free abdominal/free pelvic fluid collections. Musculoskeletal: No significant bony findings. IMPRESSION: 1. Bulky partially calcified retroperitoneal and pelvic lymphadenopathy. Patient has a history of melanoma and this certainly could represent recurrent/metastatic disease although somewhat unusual. Lymphoma would certainly be a consideration also. PET-CT and/or image guided biopsy will likely be necessary. 2. Moderate mass effect is noted on the left adrenal gland, left kidney and bowel. Aortic Atherosclerosis (ICD10-I70.0). Electronically Signed   By: Ricky Stabs.D.  On: 02/01/2021 20:14     Impression and Plan:   60 year old woman with:  1.  Retroperitoneal adenopathy detected on imaging studies obtained on 02/01/2021.  She was found to have 7.4 x 7.2 x 10 point centimeter in the left para-aortic region.  Pelvic sidewall and external iliac lymphadenopathy was also noted.   The differential diagnosis of these findings were discussed at this time.  Given her history of melanoma is always a possibility of this being a recurrence although this considered less likely at this time.  Other considerations such as lymphoma versus metastatic malignancy to the lymph nodes among other benign etiologies.  From a management standpoint, I recommended  obtaining a PET scan and tissue biopsy for further investigation.  This was discussed today with the patient in detail and she is agreeable to proceed.   2.  Back pain: She is currently on Tylenol and managing her pain reasonably well.  3.  Stage III melanoma of the left lower extremity diagnosed in 2017.  She was found to have superficial spreading with 0.25 x 0.95 mm metastatic deposit in and one of the lymph nodes indicating T2bN1 stage.  She received adjuvant immunotherapy with nivolumab for a total of 2 months and patient failed to follow-up since that time.  No evidence of recurrent disease since that time.  4.  Follow-up: Will be determined pending the results of her PET scan and biopsy.     I provided 20 minutes of non face-to-face telephone visit time during this encounter.  The time was dedicated to reviewing imaging studies, discussing differential diagnosis and the plan of action.  Zola Button, MD 02/02/2021 9:54 AM

## 2021-02-03 LAB — URINE CULTURE

## 2021-02-04 ENCOUNTER — Encounter: Payer: Self-pay | Admitting: Nurse Practitioner

## 2021-02-07 ENCOUNTER — Encounter (HOSPITAL_COMMUNITY): Payer: Self-pay

## 2021-02-07 NOTE — Progress Notes (Unsigned)
DB    3       Kobi L. Adamson Female, 60 y.o., [birth year]  MRN:  [id number] Phone:  [phone] Jerilynn Mages)       PCP:  Bernerd Limbo, MD Coverage:  Sherre Poot [employer] Ppo  Next Appt With Radiology ([facility]) 02/14/2021 at 7:00 AM           RE: Biopsy Received: 3 days ago  Message Details  Arne Cleveland, MD  Lenore Cordia Ok   CT core L paraaortic LAN   [facility]    Previous Messages  ----- Message -----  From: Lenore Cordia  Sent: 02/04/2021  2:41 PM EDT  To: [facility] Procedure Requests  Subject: Biopsy                      Procedure Requested: CT Biopsy    Reason for Procedure: lymphadenopathy    Provider Requesting: Dr Alen Blew  Provider Telephone: [phone]    Future Appointment: [facility]  Feb 14, 2021

## 2021-02-14 ENCOUNTER — Other Ambulatory Visit: Payer: Self-pay

## 2021-02-14 ENCOUNTER — Ambulatory Visit (HOSPITAL_COMMUNITY)
Admission: RE | Admit: 2021-02-14 | Discharge: 2021-02-14 | Disposition: A | Discharge status: Home / Self Care | Payer: BC Managed Care – PPO | Source: Ambulatory Visit | Attending: Oncology | Admitting: Oncology

## 2021-02-14 ENCOUNTER — Other Ambulatory Visit: Payer: Self-pay | Admitting: Radiology

## 2021-02-14 ENCOUNTER — Telehealth: Payer: Self-pay | Admitting: Oncology

## 2021-02-14 DIAGNOSIS — C439 Malignant melanoma of skin, unspecified: Secondary | ICD-10-CM | POA: Diagnosis not present

## 2021-02-14 DIAGNOSIS — R591 Generalized enlarged lymph nodes: Secondary | ICD-10-CM | POA: Diagnosis not present

## 2021-02-14 DIAGNOSIS — C779 Secondary and unspecified malignant neoplasm of lymph node, unspecified: Secondary | ICD-10-CM | POA: Diagnosis not present

## 2021-02-14 LAB — GLUCOSE, CAPILLARY: Glucose-Capillary: 93 mg/dL (ref 70–99)

## 2021-02-14 IMAGING — CT NM PET IMAGE RESTAGE (PS) WHOLE BODY
8 series · 25 of 25 positions shown · non-contrast
Comparison: CT stone study [DATE].  PET-CT [DATE]

CLINICAL DATA: Initial treatment strategy for lymphadenopathy.

EXAM:
NUCLEAR MEDICINE PET WHOLE BODY
TECHNIQUE: 7.1 mCi F-18 FDG was injected intravenously. Full-ring PET imaging
was performed from the head to foot after the radiotracer. CT data
was obtained and used for attenuation correction and anatomic
localization.
Fasting blood glucose: 93 mg/dl

[Series 3: ac ct wb 5.0 hd_fov · axial · 5.0mm · 1.52mm/px · z∈[+164,+1956]mm · 5 of 449 slices shown]
[im 1/449  full-range]
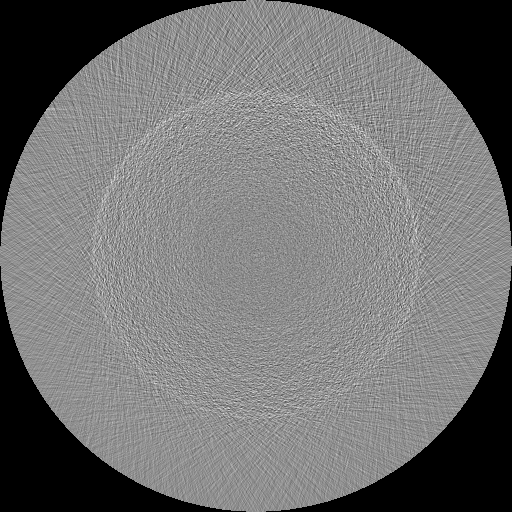
[im 113/449  soft-tissue]
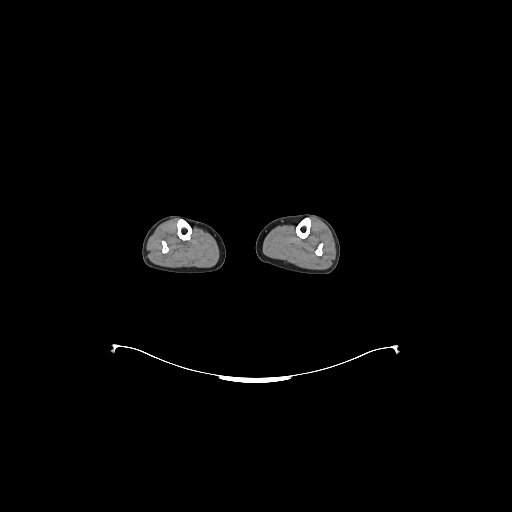
[im 225/449  soft-tissue]
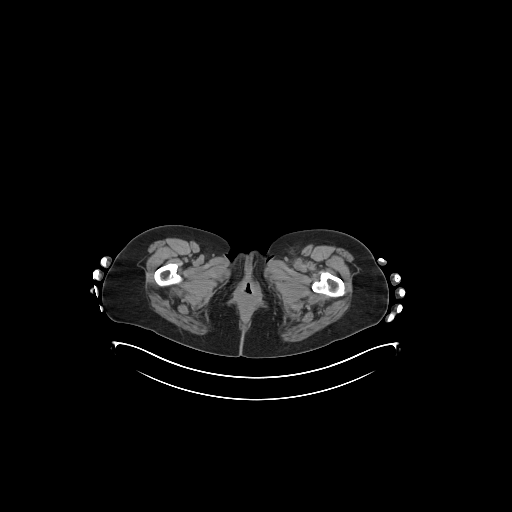
[im 337/449  soft-tissue]
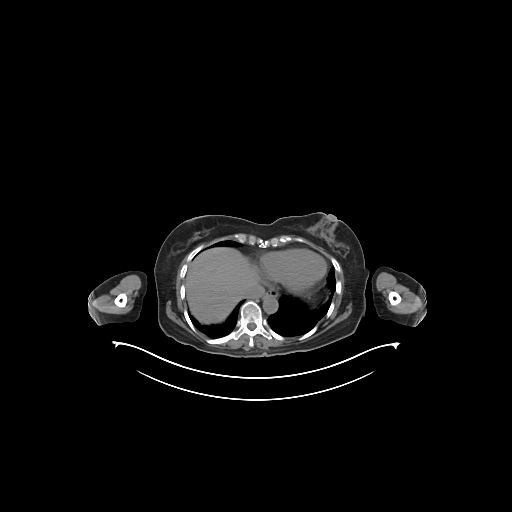
[im 449/449  soft-tissue]
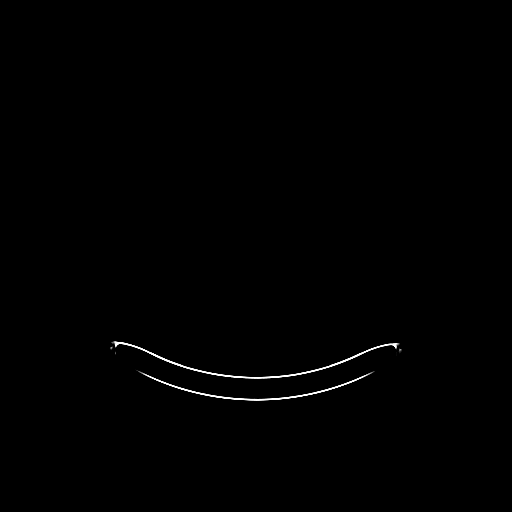

[Series 4: pet wb ac · axial · 5.0mm · 4.07mm/px · z∈[+172,+1956]mm · 5 of 447 slices shown]
[im 1/447]
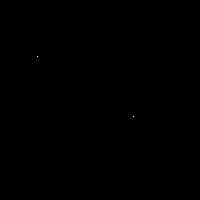
[im 112/447]
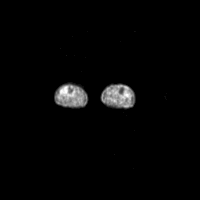
[im 224/447]
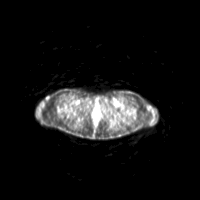
[im 335/447]
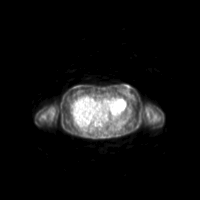
[im 447/447]
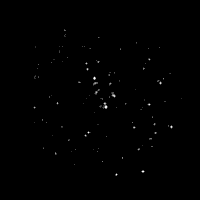

[Series 6: pet wb nac · axial · 5.0mm · 4.07mm/px · z∈[+172,+1956]mm · 6 of 447 slices shown]
[im 1/447  full-range]
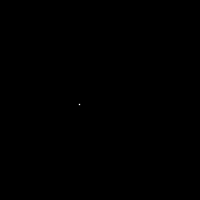
[im 90/447]
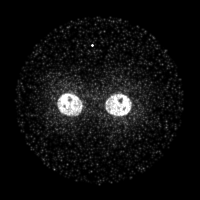
[im 179/447]
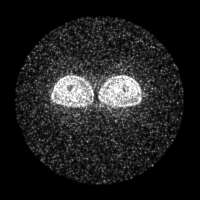
[im 268/447]
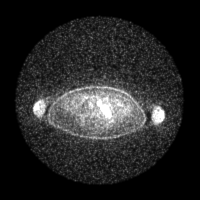
[im 357/447]
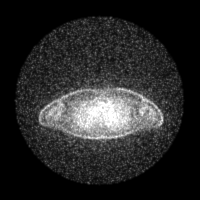
[im 447/447]
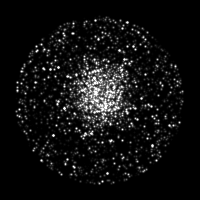

[Series 9: ct wb 5.0 br59 lung_bone · axial · 5.0mm · 0.98mm/px · 1 of 85 slices shown]
[im 1/85  lung]
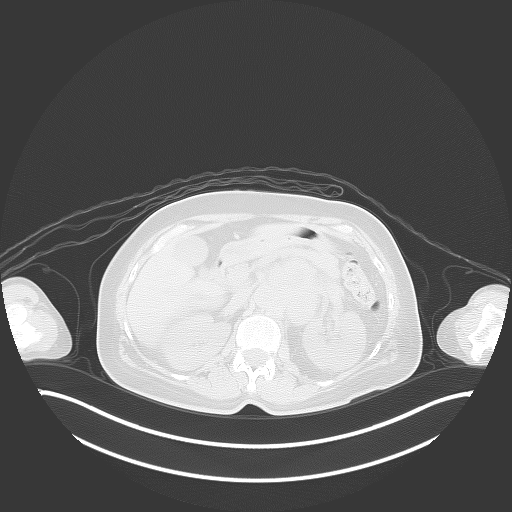

[Series 603: <mip collection> · coronal · 3.71mm/px · 1 of 32 slices shown]
[im 1/32]
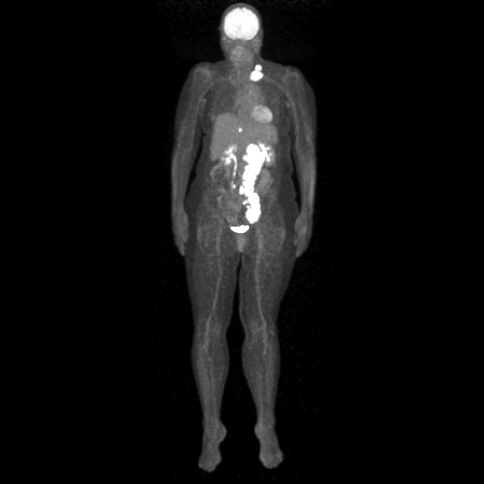

[Series 604: fused cor · 1 of 42 slices shown]
[im 1/42]
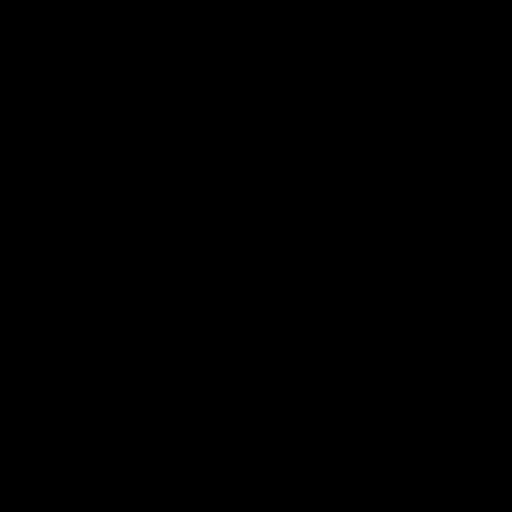

[Series 605: range-ac ct wb 5.0 hd_fov-tra-<alpha range> · 5 of 408 slices shown]
[im 1/408]
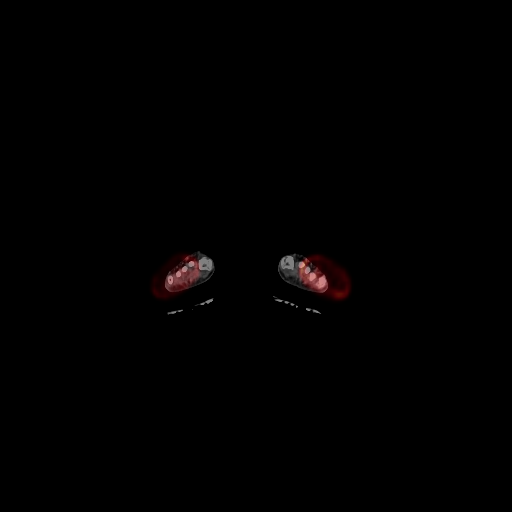
[im 102/408]
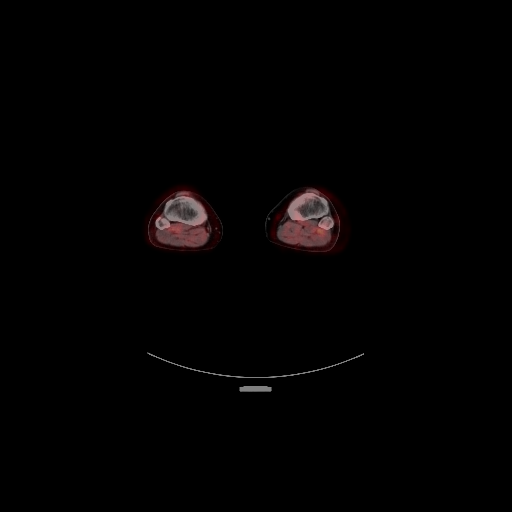
[im 204/408]
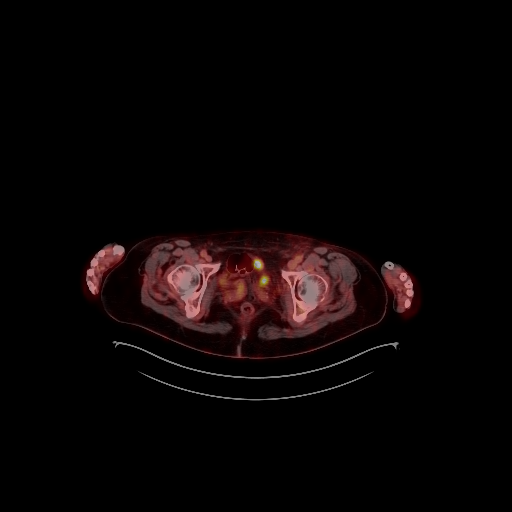
[im 306/408]
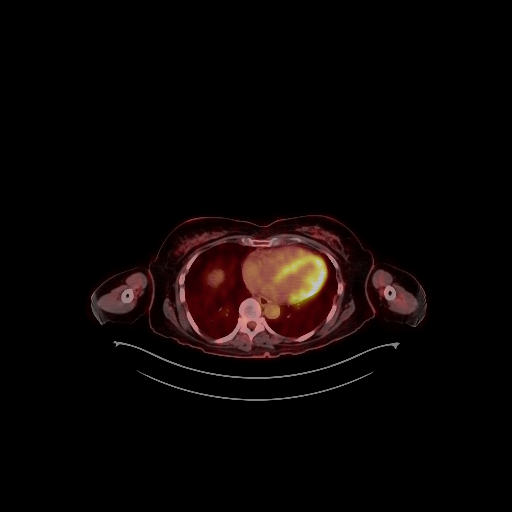
[im 408/408]
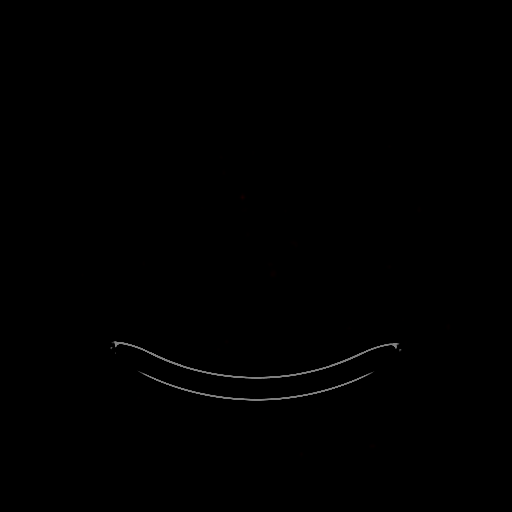

[Series 1101: results mm oncology reading · 4.0mm · 1.37mm/px · 1 of 5 slices shown]
[im 1/5]
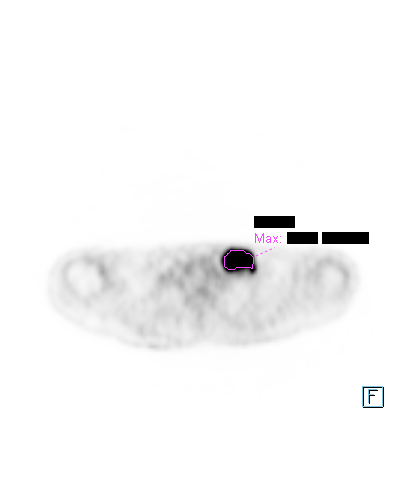

[25 of 25 positions shown; findings below may reference images not displayed]

FINDINGS: Mediastinal blood pool activity: SUV max

HEAD/NECK: Bulky left supraclavicular lymphadenopathy. 3.0 x 3.7 cm
lymph node visible on image 70/series 3 demonstrates SUV max = 14.5.

Incidental CT findings: none

CHEST: No hypermetabolic mediastinal or hilar nodes. Hypermetabolic
right retrocrural node demonstrates SUV max = 10.6. no suspicious
pulmonary nodules on the CT scan.

Incidental CT findings: Coronary artery calcification is evident.

ABDOMEN/PELVIS: Bulky left para-aortic nodal conglomeration
measuring 7.6 x 7.7 cm (146/3) demonstrates SUV max = 26.6. This
hypermetabolic lymphadenopathy extends down the retroperitoneal
space. 3.2 cm short axis left common iliac node demonstrates SUV max
= 19.5. Small hypermetabolic right common iliac nodes evident. Bulky
left pelvic sidewall lymphadenopathy evident including 5.4 x 4.5 cm
nodal mass on 199/3 with SUV max = 18.8.

Incidental CT findings: There is abdominal aortic atherosclerosis
without aneurysm.

SKELETON: No focal hypermetabolic activity to suggest skeletal
metastasis.

Incidental CT findings: none

EXTREMITIES: No abnormal hypermetabolic activity in the lower
extremities.

Incidental CT findings: none
IMPRESSION: Bulky hypermetabolic lymphadenopathy in the left supraclavicular
region, right retrocrural space, retroperitoneum, both common iliac
chains (left greater than right) and left pelvic sidewall. Imaging
features compatible with metastatic disease or lymphoma.

Aortic Atherosclerois ([HS]-170.0)

## 2021-02-14 MED ORDER — FLUDEOXYGLUCOSE F - 18 (FDG) INJECTION
9.0000 | Freq: Once | INTRAVENOUS | Status: AC | PRN
  Administered 2021-02-14: 7.14 via INTRAVENOUS

## 2021-02-14 NOTE — Telephone Encounter (Signed)
Scheduled appt per 5/2 sch msg. Pt aware.  

## 2021-02-15 ENCOUNTER — Other Ambulatory Visit: Payer: Self-pay | Admitting: Oncology

## 2021-02-15 ENCOUNTER — Other Ambulatory Visit: Payer: Self-pay

## 2021-02-15 ENCOUNTER — Ambulatory Visit (HOSPITAL_COMMUNITY)
Admission: RE | Admit: 2021-02-15 | Discharge: 2021-02-15 | Disposition: A | Discharge status: Home / Self Care | Payer: BC Managed Care – PPO | Source: Ambulatory Visit | Attending: Oncology | Admitting: Oncology

## 2021-02-15 ENCOUNTER — Encounter (HOSPITAL_COMMUNITY): Payer: Self-pay

## 2021-02-15 DIAGNOSIS — E785 Hyperlipidemia, unspecified: Secondary | ICD-10-CM | POA: Diagnosis not present

## 2021-02-15 DIAGNOSIS — R591 Generalized enlarged lymph nodes: Secondary | ICD-10-CM

## 2021-02-15 DIAGNOSIS — C770 Secondary and unspecified malignant neoplasm of lymph nodes of head, face and neck: Secondary | ICD-10-CM | POA: Insufficient documentation

## 2021-02-15 DIAGNOSIS — R221 Localized swelling, mass and lump, neck: Secondary | ICD-10-CM | POA: Diagnosis not present

## 2021-02-15 DIAGNOSIS — C439 Malignant melanoma of skin, unspecified: Secondary | ICD-10-CM

## 2021-02-15 DIAGNOSIS — Z7901 Long term (current) use of anticoagulants: Secondary | ICD-10-CM | POA: Insufficient documentation

## 2021-02-15 DIAGNOSIS — Z79899 Other long term (current) drug therapy: Secondary | ICD-10-CM | POA: Diagnosis not present

## 2021-02-15 DIAGNOSIS — Z885 Allergy status to narcotic agent status: Secondary | ICD-10-CM | POA: Insufficient documentation

## 2021-02-15 HISTORY — PX: IR US GUIDANCE: IMG2393

## 2021-02-15 LAB — CBC WITH DIFFERENTIAL/PLATELET
Abs Immature Granulocytes: 0.02 10*3/uL (ref 0.00–0.07)
Basophils Absolute: 0 10*3/uL (ref 0.0–0.1)
Basophils Relative: 0 %
Eosinophils Absolute: 0.1 10*3/uL (ref 0.0–0.5)
Eosinophils Relative: 1 %
HCT: 38.6 % (ref 36.0–46.0)
Hemoglobin: 12.3 g/dL (ref 12.0–15.0)
Immature Granulocytes: 0 %
Lymphocytes Relative: 15 %
Lymphs Abs: 1.3 10*3/uL (ref 0.7–4.0)
MCH: 26.2 pg (ref 26.0–34.0)
MCHC: 31.9 g/dL (ref 30.0–36.0)
MCV: 82.1 fL (ref 80.0–100.0)
Monocytes Absolute: 0.5 10*3/uL (ref 0.1–1.0)
Monocytes Relative: 6 %
Neutro Abs: 6.7 10*3/uL (ref 1.7–7.7)
Neutrophils Relative %: 78 %
Platelets: 418 10*3/uL — ABNORMAL HIGH (ref 150–400)
RBC: 4.7 MIL/uL (ref 3.87–5.11)
RDW: 14.4 % (ref 11.5–15.5)
WBC: 8.5 10*3/uL (ref 4.0–10.5)
nRBC: 0 % (ref 0.0–0.2)

## 2021-02-15 LAB — BASIC METABOLIC PANEL
Anion gap: 11 (ref 5–15)
BUN: 10 mg/dL (ref 8–23)
CO2: 26 mmol/L (ref 22–32)
Calcium: 9.7 mg/dL (ref 8.9–10.3)
Chloride: 103 mmol/L (ref 98–111)
Creatinine, Ser: 0.42 mg/dL — ABNORMAL LOW (ref 0.44–1.00)
GFR, Estimated: >60 mL/min (ref >60)
Glucose, Bld: 101 mg/dL — ABNORMAL HIGH (ref 70–99)
Potassium: 4.1 mmol/L (ref 3.5–5.1)
Sodium: 140 mmol/L (ref 135–145)

## 2021-02-15 LAB — PROTIME-INR
INR: 0.9 (ref 0.8–1.2)
Prothrombin Time: 12.3 seconds (ref 11.4–15.2)

## 2021-02-15 IMAGING — US IR US GUIDANCE
1 series · 13 of 19 positions shown · non-contrast
Comparison: PET-CT - [DATE]

INDICATION: History of melanoma now with extensive bulky hypermetabolic
adenopathy. Please perform ultrasound-guided biopsy of
hypermetabolic left supraclavicular nodal mass for tissue diagnostic
purposes.

EXAM:
ULTRASOUND-GUIDED LEFT SUPRACLAVICULAR NODAL MASS BIOPSY
TECHNIQUE: Informed written consent was obtained from the patient after a
discussion of the risks, benefits and alternatives to treatment.
Questions regarding the procedure were encouraged and answered.
Initial ultrasound scanning demonstrated bulky pathologically
enlarged left supraclavicular nodal conglomeration measuring at
least 4.0 x 2.5 cm (image 2). An ultrasound image was saved for
documentation purposes. The procedure was planned. A timeout was
performed prior to the initiation of the procedure.

[Series 1: ir us guidance · 13 of 19 slices shown]
[im 1/19]
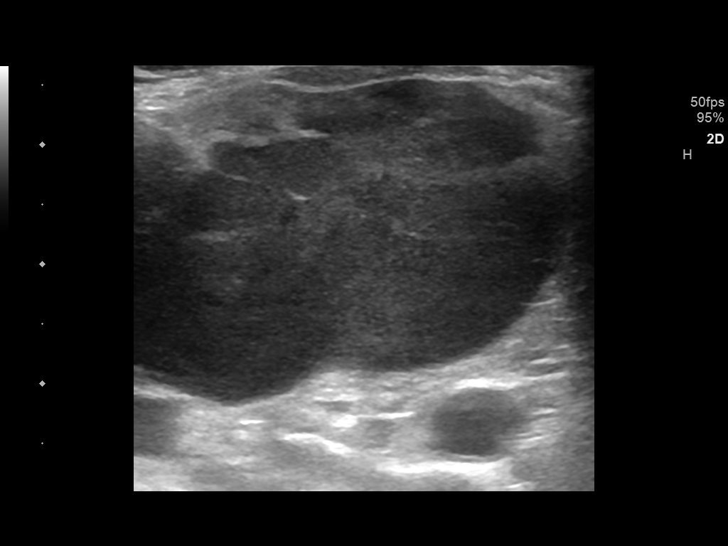
[im 3/19]
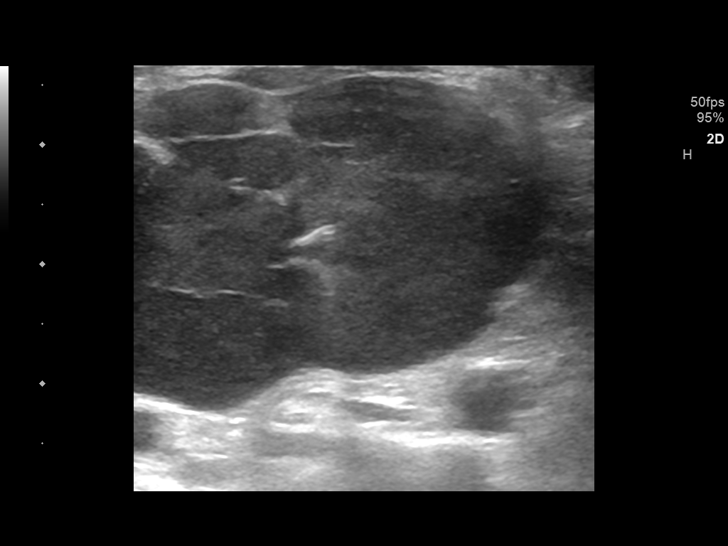
[im 4/19]
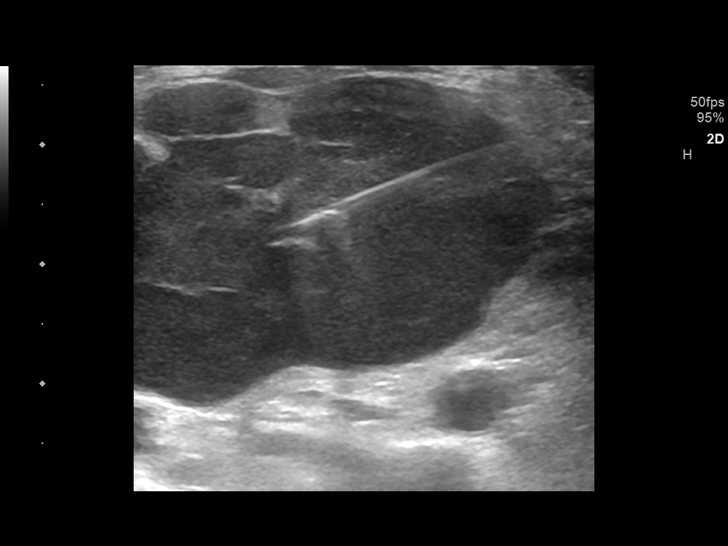
[im 6/19]
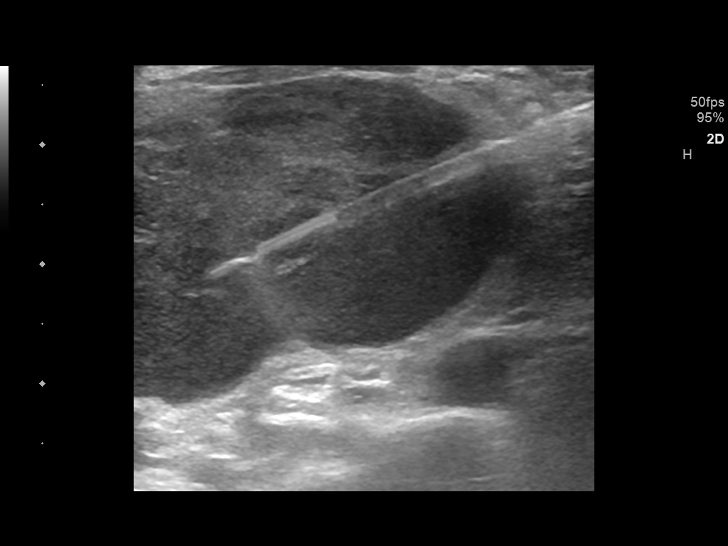
[im 7/19]
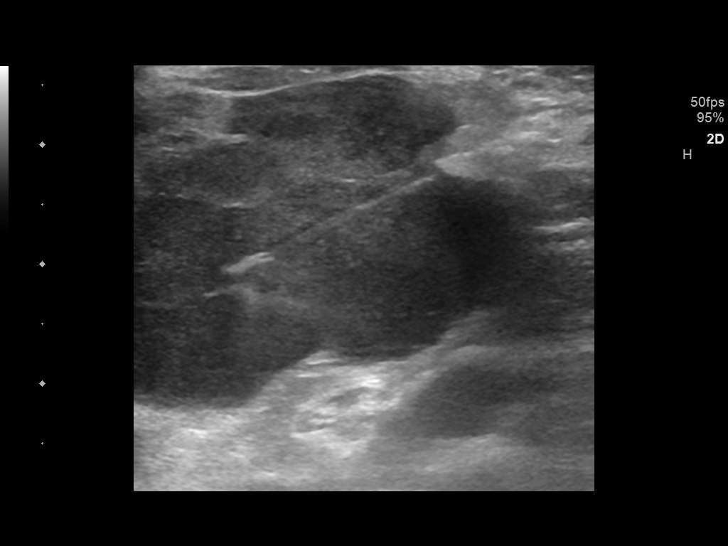
[im 9/19]
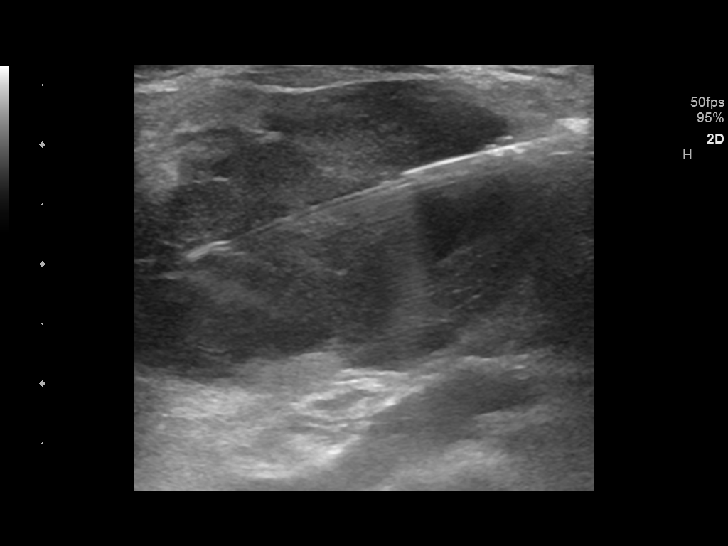
[im 10/19]
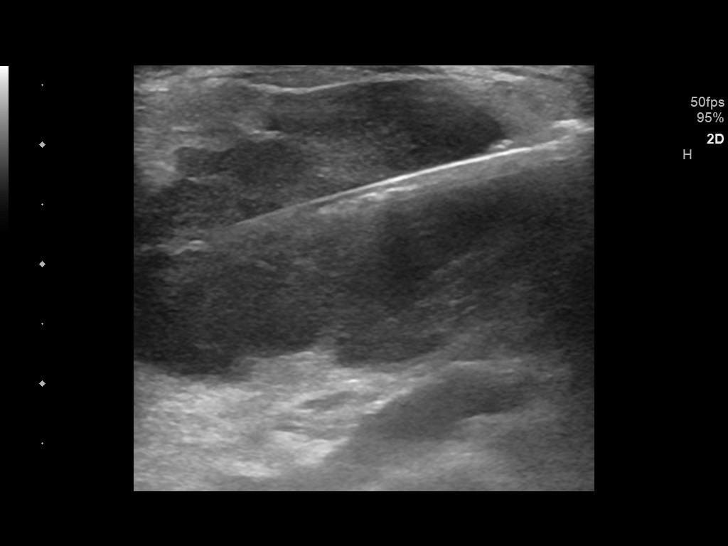
[im 11/19]
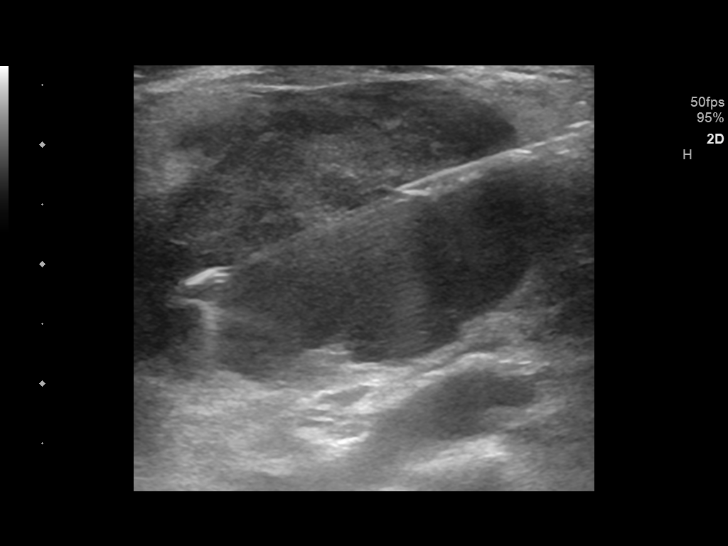
[im 13/19]
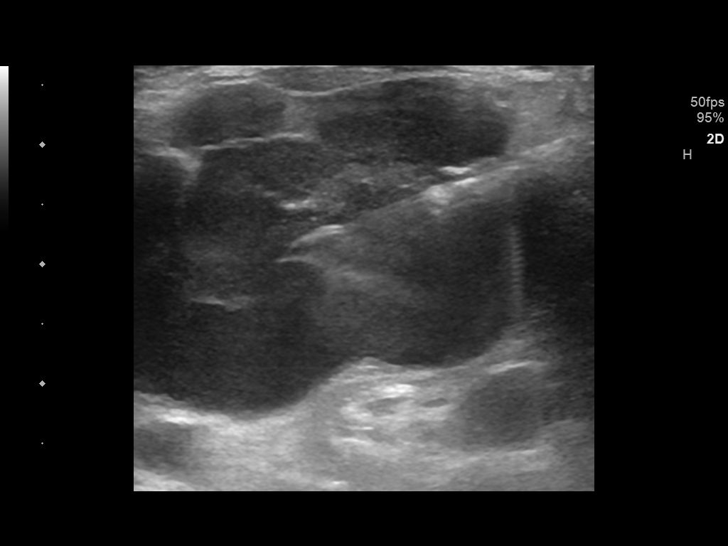
[im 14/19]
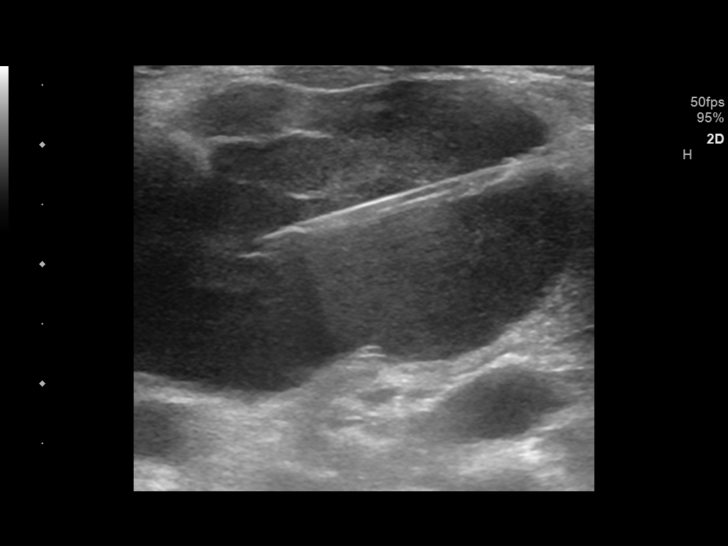
[im 16/19]
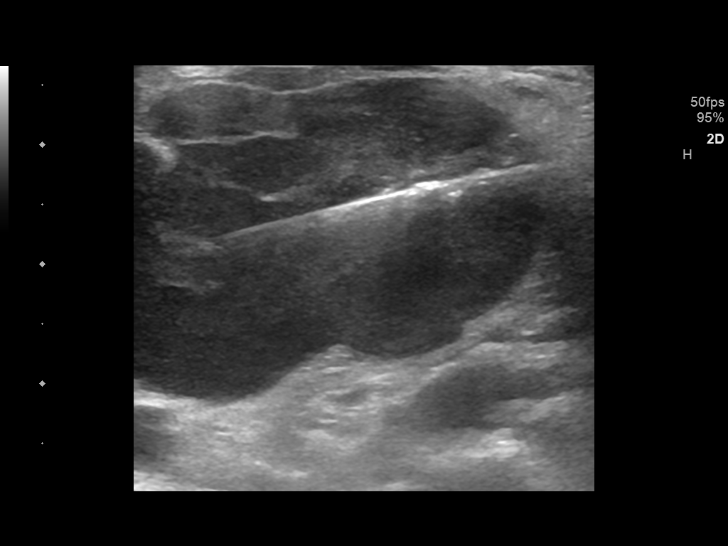
[im 17/19]
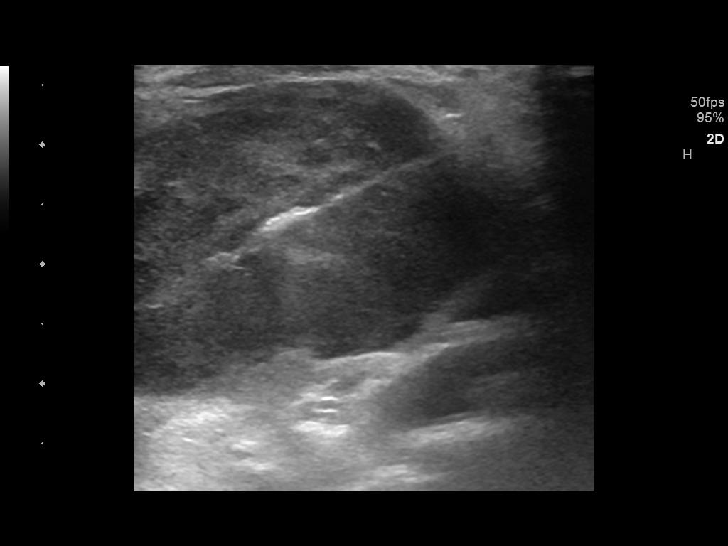
[im 19/19]
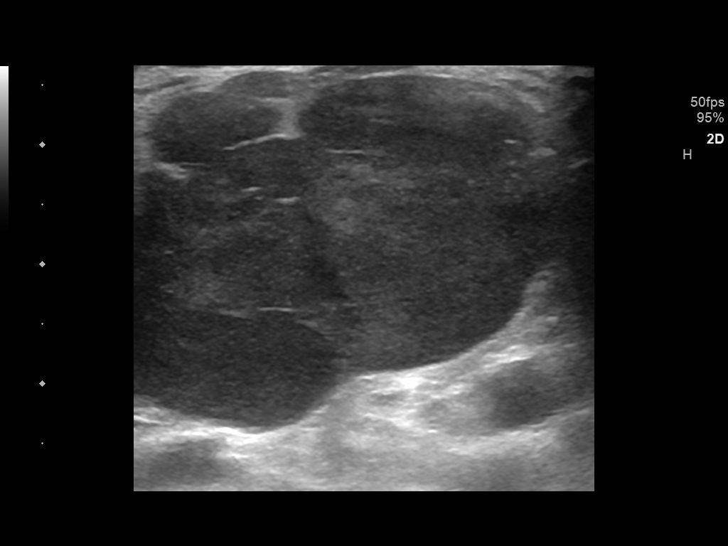

[13 of 19 positions shown; findings below may reference images not displayed]

MEDICATIONS:
None

ANESTHESIA/SEDATION:
None, per patient request

COMPLICATIONS:
None immediate.
The operative was prepped and draped in the usual sterile fashion,
and a sterile drape was applied covering the operative field. A
timeout was performed prior to the initiation of the procedure.
Local anesthesia was provided with 1% lidocaine with epinephrine.

Under direct ultrasound guidance, an 18 gauge core needle device was
utilized to obtain to obtain 8 core needle biopsies of the
indeterminate left supraclavicular nodal conglomeration.

The samples were placed in both formalin and saline and submitted to
pathology. The needle was removed and hemostasis was achieved with
manual compression. Post procedure scan was negative for significant
hematoma. A dressing was placed. The patient tolerated the procedure
well without immediate postprocedural complication.
IMPRESSION: Technically successful ultrasound guided biopsy of dominant left
supraclavicular nodal conglomeration.

## 2021-02-15 MED ORDER — MIDAZOLAM HCL 2 MG/2ML IJ SOLN
INTRAMUSCULAR | Status: AC
  Filled 2021-02-15: qty 2

## 2021-02-15 MED ORDER — SODIUM CHLORIDE 0.9 % IV SOLN: INTRAVENOUS | Status: DC

## 2021-02-15 MED ORDER — FENTANYL CITRATE (PF) 100 MCG/2ML IJ SOLN
INTRAMUSCULAR | Status: AC
  Filled 2021-02-15: qty 2

## 2021-02-15 MED ORDER — LIDOCAINE HCL 1 % IJ SOLN
INTRAMUSCULAR | Status: AC
  Administered 2021-02-15: 3 mL via TRANSDERMAL
  Filled 2021-02-15: qty 20

## 2021-02-15 NOTE — Discharge Instructions (Signed)
Needle Biopsy, Care After These instructions tell you how to care for yourself after your procedure. Your doctor may also give you more specific instructions. Call your doctor if you have any problems or questions. What can I expect after the procedure? After the procedure, it is common to have:  Soreness.  Bruising.  Mild pain. Follow these instructions at home:  Return to your normal activities as told by your doctor. Ask your doctor what activities are safe for you.  Take over-the-counter and prescription medicines only as told by your doctor.  Wash your hands with soap and water before you change your bandage (dressing). If you cannot use soap and water, use hand sanitizer.  Follow instructions from your doctor about: ? How to take care of your puncture site. ? When and how to change your bandage. ? When to remove your bandage.  Check your puncture site every day for signs of infection. Watch for: ? Redness, swelling, or pain. ? Fluid or blood. ? Pus or a bad smell. ? Warmth.  Do not take baths, swim, or use a hot tub until your doctor approves. Ask your doctor if you may take showers. You may only be allowed to take sponge baths.  Keep all follow-up visits as told by your doctor. This is important.   Contact a doctor if you have:  A fever.  Redness, swelling, or pain at the puncture site, and it lasts longer than a few days.  Fluid, blood, or pus coming from the puncture site.  Warmth coming from the puncture site. Get help right away if:  You have a lot of bleeding from the puncture site. Summary  After the procedure, it is common to have soreness, bruising, or mild pain at the puncture site.  Check your puncture site every day for signs of infection, such as redness, swelling, or pain.  Get help right away if you have severe bleeding from your puncture site. This information is not intended to replace advice given to you by your health care provider. Make  sure you discuss any questions you have with your health care provider.   Moderate Conscious Sedation, Adult, Care After This sheet gives you information about how to care for yourself after your procedure. Your health care provider may also give you more specific instructions. If you have problems or questions, contact your health care provider. What can I expect after the procedure? After the procedure, it is common to have:  Sleepiness for several hours.  Impaired judgment for several hours.  Difficulty with balance.  Vomiting if you eat too soon. Follow these instructions at home: For the time period you were told by your health care provider:  Rest.  Do not participate in activities where you could fall or become injured.  Do not drive or use machinery.  Do not drink alcohol.  Do not take sleeping pills or medicines that cause drowsiness.  Do not make important decisions or sign legal documents.  Do not take care of children on your own.      Eating and drinking  Follow the diet recommended by your health care provider.  Drink enough fluid to keep your urine pale yellow.  If you vomit: ? Drink water, juice, or soup when you can drink without vomiting. ? Make sure you have little or no nausea before eating solid foods.   General instructions  Take over-the-counter and prescription medicines only as told by your health care provider.  Have a responsible adult   stay with you for the time you are told. It is important to have someone help care for you until you are awake and alert.  Do not smoke.  Keep all follow-up visits as told by your health care provider. This is important. Contact a health care provider if:  You are still sleepy or having trouble with balance after 24 hours.  You feel light-headed.  You keep feeling nauseous or you keep vomiting.  You develop a rash.  You have a fever.  You have redness or swelling around the IV site. Get help  right away if:  You have trouble breathing.  You have new-onset confusion at home. Summary  After the procedure, it is common to feel sleepy, have impaired judgment, or feel nauseous if you eat too soon.  Rest after you get home. Know the things you should not do after the procedure.  Follow the diet recommended by your health care provider and drink enough fluid to keep your urine pale yellow.  Get help right away if you have trouble breathing or new-onset confusion at home. This information is not intended to replace advice given to you by your health care provider. Make sure you discuss any questions you have with your health care provider. Document Revised: 01/30/2020 Document Reviewed: 08/28/2019 Elsevier Patient Education  2021 [employer]  

## 2021-02-15 NOTE — Procedures (Signed)
Pre Procedure Dx: Left supraclavicular LAN.  Post Procedural Dx: Same  Technically successful US guided biopsy of hypermetabolic left supraclavicular LN.   EBL: None  No immediate complications.   Ronny Bacon, MD Pager #: [phone]

## 2021-02-15 NOTE — Consult Note (Signed)
Chief Complaint: Patient was seen in consultation today for image guided biopsy of left periaortic lymph node versus other lymph node  Referring Physician(s): Wyatt Portela  Supervising Physician: Sandi Mariscal  Patient Status: [facility] - Out-pt  History of Present Illness: Sharon Kirk is a 60 y.o. female with past medical history of hyperlipidemia, IBS, melanoma in 2017 with prior immunotherapy.  She recently underwent CT for back /LLQ abd pain which revealed bulky partially calcified retroperitoneal and pelvic lymphadenopathy with moderate mass-effect noted on the left adrenal gland and left kidney and bowel.  PET scan on 02/14/2021 revealed bulky hypermetabolic lymphadenopathy in the left supraclavicular region, right retrocrural space, retroperitoneum, both common iliac chains and left pelvic sidewall.  She presents today for image guided lymph node biopsy for further evaluation.  Past Medical History:  Diagnosis Date  . Allergy   . Cancer ([place])    Melanoma  . Hyperlipidemia   . IBS (irritable bowel syndrome)   . Osteopenia   . PONV (postoperative nausea and vomiting)   . Thyroid nodule     Past Surgical History:  Procedure Laterality Date  . ABDOMINAL HYSTERECTOMY  1991  . DILATION AND CURETTAGE OF UTERUS  1988  . MELANOMA EXCISION Right 05/23/2016   Procedure: WIDE  EXCISION RIGHT SHOULDER MELANOMA AND WIDE EXCISION LEFT LOWER LEG MELANOMA;  Surgeon: Jackolyn Confer, MD;  Location: [place];  Service: General;  Laterality: Right;  right shoulder and left lower leg sites  . SENTINEL NODE BIOPSY Left 05/23/2016   Procedure: LEFT INGUINAL SENTINEL LYMPH  NODE BIOPSY;  Surgeon: Jackolyn Confer, MD;  Location: [place];  Service: General;  Laterality: Left;  . THYROID SURGERY  07/2015   NODULE    Allergies: Codeine  Medications: Prior to Admission medications   Medication Sig Start Date End Date Taking? Authorizing Provider  calcium  carbonate (OS-CAL) 600 MG TABS Take 600 mg by mouth 2 (two) times daily with a meal.   Yes [provider]  cholecalciferol (VITAMIN D) 1000 UNITS tablet Take 5,000 Units by mouth daily.   Yes [provider]  Cyanocobalamin (VITAMIN B-12 CR PO) Take 1 tablet by mouth daily.   Yes [provider]  albuterol (VENTOLIN HFA) 108 (90 Base) MCG/ACT inhaler Inhale 1 puff into the lungs every 4 (four) hours as needed for wheezing or shortness of breath. 08/24/20   Pokhrel, Corrie Mckusick, MD  apixaban (ELIQUIS) 5 MG TABS tablet Take 1 tablet (5 mg total) by mouth 2 (two) times daily. 08/24/20   Pokhrel, Corrie Mckusick, MD  venlafaxine XR (EFFEXOR XR) 37.5 MG 24 hr capsule Take 1 capsule (37.5 mg total) by mouth daily with breakfast. 09/30/20   Tamela Gammon, NP     Family History  Problem Relation Age of Onset  . Diabetes Brother   . Hypertension Brother   . Cancer Maternal Grandmother        COLON  . Cancer Paternal Grandfather        PROSTATE  . Hypertension Mother   . Diabetes Brother   . Heart attack Father     Social History   Socioeconomic History  . Marital status: Married    Spouse name: Audiological scientist  . Number of children: 0  . Years of education: 97  . Highest education level: Not on file  Occupational History  . Occupation: Research scientist (physical sciences): [employer]  Tobacco Use  . Smoking status: Never Smoker  . Smokeless  tobacco: Never Used  Vaping Use  . Vaping Use: Never used  Substance and Sexual Activity  . Alcohol use: No  . Drug use: No  . Sexual activity: Yes    Partners: Male    Birth control/protection: Surgical, None  Other Topics Concern  . Not on file  Social History Narrative   Lives with her husband.   Social Determinants of Health   Financial Resource Strain: Not on file  Food Insecurity: Not on file  Transportation Needs: Not on file  Physical Activity: Not on file  Stress: Not on file  Social Connections: Not on file       Review of Systems:  denies fever, chest pain, dyspnea, cough, vomiting or abnormal bleeding.  She does have a headache, back/left lower abdominal discomfort and occasional nausea and left lower neck swelling  Vital Signs: BP 112/79   Pulse 77   Temp 98.2 F (36.8 C) (Oral)   Resp 18   LMP 06/30/1990   SpO2 99%   Physical Exam awake, alert.  Chest clear to auscultation bilaterally.  Heart with regular rate and rhythm.  Abdomen soft, positive bowel sounds, mildly tender left lower quadrant to palpation.  No lower extremity edema.  Prominent left supraclavicular adenopathy noted, nontender.  Imaging: NM PET Image Restage (PS) Whole Body  Result Date: 02/14/2021 CLINICAL DATA:  Initial treatment strategy for lymphadenopathy. EXAM: NUCLEAR MEDICINE PET WHOLE BODY TECHNIQUE: 7.1 mCi F-18 FDG was injected intravenously. Full-ring PET imaging was performed from the head to foot after the radiotracer. CT data was obtained and used for attenuation correction and anatomic localization. Fasting blood glucose: 93 mg/dl COMPARISON:  CT stone study 02/01/2021.  PET-CT 06/28/2016 FINDINGS: Mediastinal blood pool activity: SUV max 2.2 HEAD/NECK: Bulky left supraclavicular lymphadenopathy. 3.0 x 3.7 cm lymph node visible on image 70/series 3 demonstrates SUV max = 14.5. Incidental CT findings: none CHEST: No hypermetabolic mediastinal or hilar nodes. Hypermetabolic right retrocrural node demonstrates SUV max = 10.6. no suspicious pulmonary nodules on the CT scan. Incidental CT findings: Coronary artery calcification is evident. ABDOMEN/PELVIS: Bulky left para-aortic nodal conglomeration measuring 7.6 x 7.7 cm (146/3) demonstrates SUV max = 26.6. This hypermetabolic lymphadenopathy extends down the retroperitoneal space. 3.2 cm short axis left common iliac node demonstrates SUV max = 19.5. Small hypermetabolic right common iliac nodes evident. Bulky left pelvic sidewall lymphadenopathy evident including 5.4 x  4.5 cm nodal mass on 199/3 with SUV max = 18.8. Incidental CT findings: There is abdominal aortic atherosclerosis without aneurysm. SKELETON: No focal hypermetabolic activity to suggest skeletal metastasis. Incidental CT findings: none EXTREMITIES: No abnormal hypermetabolic activity in the lower extremities. Incidental CT findings: none IMPRESSION: Bulky hypermetabolic lymphadenopathy in the left supraclavicular region, right retrocrural space, retroperitoneum, both common iliac chains (left greater than right) and left pelvic sidewall. Imaging features compatible with metastatic disease or lymphoma. Aortic Atherosclerois (ICD10-170.0) Electronically Signed   By: Misty Stanley M.D.   On: 02/14/2021 11:42   CT Renal Stone Study  Result Date: 02/01/2021 CLINICAL DATA:  Left flank pain and left lower quadrant abdominal pain since April. EXAM: CT ABDOMEN AND PELVIS WITHOUT CONTRAST TECHNIQUE: Multidetector CT imaging of the abdomen and pelvis was performed following the standard protocol without IV contrast. COMPARISON:  PET-CT from 2017 FINDINGS: Lower chest: The lung bases are clear of acute process. No pleural effusion or pulmonary lesions. The heart is normal in size. No pericardial effusion. The distal esophagus and aorta are unremarkable. Hepatobiliary: No hepatic lesions are  identified without contrast. The gallbladder is grossly normal. No common bile duct dilatation. Pancreas: No mass, inflammation or ductal dilatation. Spleen: Normal size.  No focal lesions. Adrenals/Urinary Tract: Adrenal glands and kidneys are unremarkable. The bladder is unremarkable. Stomach/Bowel: The stomach, duodenum, small bowel and colon are grossly normal. No acute inflammatory changes, mass lesions or obstructive findings. The terminal ileum is normal. The appendix is normal. Vascular/Lymphatic: Scattered aortic calcifications. There is bulk E partially calcified retroperitoneal lymphadenopathy. The largest nodal mass in the  left para-aortic region measures approximately 7.4 x 7.2 x 10.1 cm. There are some areas of calcification noted. 11 mm right-sided retroperitoneal node on image 43/2 also demonstrates faint calcifications and is located just behind the IVC. The adenopathy continues down along the left common iliac region and down into the left side of the pelvis. There is bulky pelvic sidewall and external iliac lymphadenopathy. The pelvic sidewall node measures 2.9 cm and the left external iliac lymph node measures 2.7 cm. No inguinal lymphadenopathy. Reproductive: The uterus is surgically absent. I believe both ovaries are still present and appear normal. Other: No free abdominal/free pelvic fluid collections. Musculoskeletal: No significant bony findings. IMPRESSION: 1. Bulky partially calcified retroperitoneal and pelvic lymphadenopathy. Patient has a history of melanoma and this certainly could represent recurrent/metastatic disease although somewhat unusual. Lymphoma would certainly be a consideration also. PET-CT and/or image guided biopsy will likely be necessary. 2. Moderate mass effect is noted on the left adrenal gland, left kidney and bowel. Aortic Atherosclerosis (ICD10-I70.0). Electronically Signed   By: Marijo Sanes M.D.   On: 02/01/2021 20:14    Labs:  CBC: Recent Labs    08/20/20 0256 08/21/20 0237 02/01/21 1915 02/15/21 0650  WBC 13.5* 13.3* 8.4 8.5  HGB 12.3 12.9 12.6 12.3  HCT 38.4 40.4 39.1 38.6  PLT 387 411* 360 418*    COAGS: Recent Labs    02/15/21 0650  INR 0.9    BMP: Recent Labs    08/20/20 0256 08/21/20 0237 02/01/21 1915 02/15/21 0650  NA 134* 136 134* 140  K 4.3 4.7 3.9 4.1  CL 98 99 97* 103  CO2 26 28 28 26   GLUCOSE 148* 144* 106* 101*  BUN 15 19 8 10   CALCIUM 7.8* 8.1* 9.7 9.7  CREATININE 0.40* 0.46 0.44 0.42*  GFRNONAA >60 >60 >60 >60    LIVER FUNCTION TESTS: Recent Labs    08/17/20 0311 08/18/20 0313 08/20/20 0256 08/21/20 0237  BILITOT 0.5 0.6 0.8  0.9  AST 16 17 18 18   ALT 44 45* 44 52*  ALKPHOS 91 79 71 66  PROT 4.9* 4.8* 5.0* 5.1*  ALBUMIN 2.2* 2.3* 2.3* 2.5*    TUMOR MARKERS: No results for input(s): AFPTM, CEA, CA199, CHROMGRNA in the last 8760 hours.  Assessment and Plan: 60 y.o. female with past medical history of hyperlipidemia, IBS, melanoma in 2017 with prior immunotherapy.  She recently underwent CT for back /LLQ abd pain which revealed bulky partially calcified retroperitoneal and pelvic lymphadenopathy with moderate mass-effect noted on the left adrenal gland and left kidney and bowel.  PET scan on 02/14/2021 revealed bulky hypermetabolic lymphadenopathy in the left supraclavicular region, right retrocrural space, retroperitoneum, both common iliac chains and left pelvic sidewall.  She presents today for image guided lymph node biopsy for further evaluation.Risks and benefits of procedure was discussed with the patient  including, but not limited to bleeding, infection, damage to adjacent structures or low yield requiring additional tests.  All of the questions were  answered and there is agreement to proceed.  Consent signed and in chart.     Thank you for this interesting consult.  I greatly enjoyed meeting Sharon Kirk and look forward to participating in their care.  A copy of this report was sent to the requesting provider on this date.  Electronically Signed: D. Rowe Robert, PA-C 02/15/2021, 8:33 AM   I spent a total of 25 minutes    in face to face in clinical consultation, greater than 50% of which was counseling/coordinating care for image guided left periaortic lymph node biopsy versus other lymph node biopsy

## 2021-02-15 NOTE — Progress Notes (Signed)
Pt. returened from Radiology post biopsy.   Ordres for DC at 0945.   Pt did not get sedation.  VSS, left subclavicular biopsy site C/D/I no bleeding.   Reviewed all post IR instrucitons with pt. And was discharged to front entrance at 1000

## 2021-02-17 LAB — SURGICAL PATHOLOGY

## 2021-02-21 ENCOUNTER — Inpatient Hospital Stay: Payer: BC Managed Care – PPO | Attending: Oncology | Admitting: Oncology

## 2021-02-21 ENCOUNTER — Other Ambulatory Visit: Payer: Self-pay

## 2021-02-21 VITALS — BP 106/81 | HR 78 | Temp 97.2°F | Resp 17 | Ht 65.5 in | Wt 149.6 lb

## 2021-02-21 DIAGNOSIS — C4370 Malignant melanoma of unspecified lower limb, including hip: Secondary | ICD-10-CM | POA: Diagnosis not present

## 2021-02-21 DIAGNOSIS — Z79899 Other long term (current) drug therapy: Secondary | ICD-10-CM | POA: Diagnosis not present

## 2021-02-21 DIAGNOSIS — Z7189 Other specified counseling: Secondary | ICD-10-CM | POA: Diagnosis not present

## 2021-02-21 DIAGNOSIS — C439 Malignant melanoma of skin, unspecified: Secondary | ICD-10-CM

## 2021-02-21 DIAGNOSIS — Z5112 Encounter for antineoplastic immunotherapy: Secondary | ICD-10-CM | POA: Insufficient documentation

## 2021-02-21 MED ORDER — TRAMADOL HCL 50 MG PO TABS: 50.0000 mg | ORAL_TABLET | Freq: Four times a day (QID) | ORAL | 0 refills | Status: DC | PRN

## 2021-02-21 MED ORDER — PROCHLORPERAZINE MALEATE 10 MG PO TABS: 10.0000 mg | ORAL_TABLET | Freq: Four times a day (QID) | ORAL | 0 refills | Status: DC | PRN

## 2021-02-21 MED ORDER — LORAZEPAM 1 MG PO TABS: ORAL_TABLET | ORAL | 0 refills | Status: DC

## 2021-02-21 NOTE — Progress Notes (Incomplete)
Hematology and Oncology Follow Up  Sharon Kirk [id number] 1961-08-07 60 y.o. 02/21/2021 8:17 AM [name], MD       Principle Diagnosis: 60 year old woman with stage IV melanoma diagnosed in April 2022.  She presented with diffuse adenopathy after initially diagnosed with localized disease in 2017.  Secondary diagnoses:    1. Superficial spreading melanoma of the lower extremity diagnosed in August 2017. She was found to have 1.25 mm thickness without ulceration or lymphovascular invasion. She had 1 out of 2 lymph nodes positive. The size of the lymph node measuring 0.25 x 0.95 mm with pathological staging T2bN1.   2. 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 These findings indicate a pathological staging of T1 NA.   Prior Therapy: She is status post local excision followed by sentinel lymph node sampling with 2 lymph nodes sampled one was involved with metastatic melanoma. The measurement was 0.25 x 0.95 mm in dimension of the capsule. Her final pathological stage stage is T2bN1. This was completed on 05/23/2016.   Nivolumab total of 240 mg dose every 2 weeks for total of 4 doses. Started on 07/21/2016.  She completed 4 cycles of therapy for a total of 2 months out of planned 12.  Patient opted out continuing immunotherapy.  She is status post lymph node biopsy completed on Feb 15, 2021.    Current therapy:   Under consideration for salvage therapy.   Interim History: Sharon Kirk returns today for repeat evaluation.  Since the last visit, she continues to report abdominal pain and discomfort which is manageable by ibuprofen but she has to take it multiple times.  She has reported some weight loss but no fevers or chills.  She has a reported enlarging left supraclavicular lymph node.  She tolerated the biopsy without any complaints.  She continues to work  from home with some decline in her performance status but overall remains reasonably active.    Medications: Updated on review. Current Outpatient Medications  Medication Sig Dispense Refill  . albuterol (VENTOLIN HFA) 108 (90 Base) MCG/ACT inhaler Inhale 1 puff into the lungs every 4 (four) hours as needed for wheezing or shortness of breath. 1 each 2  . apixaban (ELIQUIS) 5 MG TABS tablet Take 1 tablet (5 mg total) by mouth 2 (two) times daily. 60 tablet 2  . calcium carbonate (OS-CAL) 600 MG TABS Take 600 mg by mouth 2 (two) times daily with a meal.    . cholecalciferol (VITAMIN D) 1000 UNITS tablet Take 5,000 Units by mouth daily.    . Cyanocobalamin (VITAMIN B-12 CR PO) Take 1 tablet by mouth daily.    Marland Kitchen venlafaxine XR (EFFEXOR XR) 37.5 MG 24 hr capsule Take 1 capsule (37.5 mg total) by mouth daily with breakfast. 90 capsule 0   No current facility-administered medications for this visit.     Allergies:  Allergies  Allergen Reactions  . Codeine Nausea Only and Other (See Comments)    Hallucinations; confusion   Physical exam Blood pressure 106/81, pulse 78, temperature (!) 97.2 F (36.2 C), temperature source Tympanic, resp. rate 17, height 5' 5.5" (1.664 m), weight 149 lb 9.6 oz (67.9 kg), last menstrual period 06/30/1990, SpO2 99 %.   ECOG 0  General appearance: Comfortable appearing without any discomfort Head: Normocephalic without any trauma Oropharynx: Mucous membranes are moist and pink without any thrush or ulcers. Eyes: Pupils  are equal and round reactive to light. Lymph nodes:  Palpable left supraclavicular lymph node noted on exam. Heart:regular rate and rhythm.  S1 and S2 without leg edema. Lung: Clear without any rhonchi or wheezes.  No dullness to percussion. Abdomin: Soft, nontender, nondistended with good bowel sounds.  No hepatosplenomegaly. Musculoskeletal: No joint deformity or effusion.  Full range of motion noted. Neurological: No deficits noted on  motor, sensory and deep tendon reflex exam. Skin: No petechial rash or dryness.  Appeared moist.  Psychiatric: Mood and affect appeared appropriate.     Lab Results: Lab Results  Component Value Date   WBC 8.5 02/15/2021   HGB 12.3 02/15/2021   HCT 38.6 02/15/2021   MCV 82.1 02/15/2021   PLT 418 (H) 02/15/2021     Chemistry      Component Value Date/Time   NA 140 02/15/2021 0650   NA 140 09/08/2016 0755   K 4.1 02/15/2021 0650   K 3.9 09/08/2016 0755   CL 103 02/15/2021 0650   CO2 26 02/15/2021 0650   CO2 23 09/08/2016 0755   BUN 10 02/15/2021 0650   BUN 11.2 09/08/2016 0755   CREATININE 0.42 (L) 02/15/2021 0650   CREATININE 0.61 09/30/2019 0839   CREATININE 0.7 09/08/2016 0755      Component Value Date/Time   CALCIUM 9.7 02/15/2021 0650   CALCIUM 9.1 09/08/2016 0755   ALKPHOS 66 08/21/2020 0237   ALKPHOS 61 09/08/2016 0755   AST 18 08/21/2020 0237   AST 13 09/08/2016 0755   ALT 52 (H) 08/21/2020 0237   ALT 16 09/08/2016 0755   BILITOT 0.9 08/21/2020 0237   BILITOT 0.42 09/08/2016 0755     IMPRESSION: Bulky hypermetabolic lymphadenopathy in the left supraclavicular region, right retrocrural space, retroperitoneum, both common iliac chains (left greater than right) and left pelvic sidewall. Imaging features compatible with metastatic disease or lymphoma.  Aortic Atherosclerois (ICD10-170.0)   Impression and Plan:   60 year old woman with:  1.  Stage IV melanoma documented in April 2022 with CT scan that showed bulky adenopathy and underwent lymph node biopsy in May 2022 and confirmed the presence of metastatic melanoma.   PET scan obtained on Feb 14, 2021 was personally reviewed today and discussed with the patient.  Pathology results also from her lymph node biopsy was reviewed.  Her BRAF status is currently pending and has been requested by pathology.  Treatment options were reviewed at this time given the fact that we are dealing with stage IV melanoma.   Combination immunotherapy with ipilimumab and nivolumab remains the best reasonable option at this time.  Ipilimumab 3 mg/kg and nivolumab 1 mg/kg every 3 weeks for 4 cycles is recommended.  Complication associated with this therapy include nausea, fatigue and immune mediated complications which could be problematic.  These complications such as pneumonitis, colitis and thyroid disease at times could be life-threatening.  BRAF targeted therapy could be an alternative if she harbors the appropriate mutation.  She is agreeable to proceed at this time.  2.  IV access: Peripheral veins will be in use for the time being.  Port-A-Cath option will be deferred.  3.  Antiemetics: Prescription for Compazine will be made available to her.   4.  Autoimmune complications: We will continue to monitor closely including pneumonitis, colitis, hypophysitis among others.   5.  CNS staging: We will obtain MRI of the brain near future to complete her staging work-up.   6.  Abdominal pain: Prescription for tramadol will be  available to her to decrease the amount of ibuprofen.  7.  Treatment goal and prognosis: Therapy is palliative although aggressive measures are warranted given her excellent performance status.  8.  Follow-up: In the near future to start therapy.   40  minutes were dedicated to this visit. The time was spent on reviewing laboratory data, imaging studies, discussing treatment options, and addressing complications related to her cancer and cancer therapy and answering questions regarding future plan.   Zola Button, MD 02/21/2021 8:17 AM

## 2021-02-21 NOTE — Progress Notes (Signed)
Hematology and Oncology Follow Up  Sharon Kirk [id number] September 22, 1961 60 y.o. 02/21/2021 8:17 AM Sharon Face, MD       Principle Diagnosis: 60 year old woman with stage IV melanoma diagnosed in April 2022.  She presented with diffuse adenopathy after initially diagnosed with localized disease in 2017.  Secondary diagnoses:    1. Superficial spreading melanoma of the lower extremity diagnosed in August 2017. She was found to have 1.25 mm thickness without ulceration or lymphovascular invasion. She had 1 out of 2 lymph nodes positive. The size of the lymph node measuring 0.25 x 0.95 mm with pathological staging T2bN1.   2. 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 These findings indicate a pathological staging of T1 NA.   Prior Therapy: She is status post local excision followed by sentinel lymph node sampling with 2 lymph nodes sampled one was involved with metastatic melanoma. The measurement was 0.25 x 0.95 mm in dimension of the capsule. Her final pathological stage stage is T2bN1. This was completed on 05/23/2016.   Nivolumab total of 240 mg dose every 2 weeks for total of 4 doses. Started on 07/21/2016.  She completed 4 cycles of therapy for a total of 2 months out of planned 12.  Patient opted out continuing immunotherapy.  She is status post lymph node biopsy completed on Feb 15, 2021.    Current therapy:   Under consideration for salvage therapy.   Interim History: Sharon Kirk returns today for repeat evaluation.  Since the last visit,    Medications: Updated on review. Current Outpatient Medications  Medication Sig Dispense Refill  . albuterol (VENTOLIN HFA) 108 (90 Base) MCG/ACT inhaler Inhale 1 puff into the lungs every 4 (four) hours as needed for wheezing or shortness of breath. 1 each 2  . apixaban (ELIQUIS) 5 MG TABS tablet Take 1 tablet  (5 mg total) by mouth 2 (two) times daily. 60 tablet 2  . calcium carbonate (OS-CAL) 600 MG TABS Take 600 mg by mouth 2 (two) times daily with a meal.    . cholecalciferol (VITAMIN D) 1000 UNITS tablet Take 5,000 Units by mouth daily.    . Cyanocobalamin (VITAMIN B-12 CR PO) Take 1 tablet by mouth daily.    Marland Kitchen venlafaxine XR (EFFEXOR XR) 37.5 MG 24 hr capsule Take 1 capsule (37.5 mg total) by mouth daily with breakfast. 90 capsule 0   No current facility-administered medications for this visit.     Allergies:  Allergies  Allergen Reactions  . Codeine Nausea Only and Other (See Comments)    Hallucinations; confusion   Physical exam  ECOG 0  General appearance: Comfortable appearing without any discomfort Head: Normocephalic without any trauma Oropharynx: Mucous membranes are moist and pink without any thrush or ulcers. Eyes: Pupils are equal and round reactive to light. Lymph nodes: No cervical, supraclavicular, inguinal or axillary lymphadenopathy.   Heart:regular rate and rhythm.  S1 and S2 without leg edema. Lung: Clear without any rhonchi or wheezes.  No dullness to percussion. Abdomin: Soft, nontender, nondistended with good bowel sounds.  No hepatosplenomegaly. Musculoskeletal: No joint deformity or effusion.  Full range of motion noted. Neurological: No deficits noted on motor, sensory and deep tendon reflex exam. Skin: No petechial rash or dryness.  Appeared moist.  Psychiatric: Mood and affect appeared appropriate.     Lab Results: Lab Results  Component Value Date   WBC  8.5 02/15/2021   HGB 12.3 02/15/2021   HCT 38.6 02/15/2021   MCV 82.1 02/15/2021   PLT 418 (H) 02/15/2021     Chemistry      Component Value Date/Time   NA 140 02/15/2021 0650   NA 140 09/08/2016 0755   K 4.1 02/15/2021 0650   K 3.9 09/08/2016 0755   CL 103 02/15/2021 0650   CO2 26 02/15/2021 0650   CO2 23 09/08/2016 0755   BUN 10 02/15/2021 0650   BUN 11.2 09/08/2016 0755   CREATININE  0.42 (L) 02/15/2021 0650   CREATININE 0.61 09/30/2019 0839   CREATININE 0.7 09/08/2016 0755      Component Value Date/Time   CALCIUM 9.7 02/15/2021 0650   CALCIUM 9.1 09/08/2016 0755   ALKPHOS 66 08/21/2020 0237   ALKPHOS 61 09/08/2016 0755   AST 18 08/21/2020 0237   AST 13 09/08/2016 0755   ALT 52 (H) 08/21/2020 0237   ALT 16 09/08/2016 0755   BILITOT 0.9 08/21/2020 0237   BILITOT 0.42 09/08/2016 0755     IMPRESSION: Bulky hypermetabolic lymphadenopathy in the left supraclavicular region, right retrocrural space, retroperitoneum, both common iliac chains (left greater than right) and left pelvic sidewall. Imaging features compatible with metastatic disease or lymphoma.  Aortic Atherosclerois (ICD10-170.0)   Impression and Plan:   60 year old woman with:  1.  Stage IV melanoma documented in April 2022 with CT scan that showed bulky adenopathy and underwent lymph node biopsy in May 2022 and confirmed the presence of metastatic melanoma.   PET scan obtained on Feb 14, 2021 was personally reviewed today and discussed with the patient.  Pathology results also from her lymph node biopsy was reviewed.  Her BRAF status is currently pending and has been requested by pathology.  Treatment options were reviewed at this time given the fact that we are dealing with stage IV melanoma.  Combination immunotherapy with ipilimumab and nivolumab remains the best reasonable option at this time.  Ipilimumab 3 mg/kg and nivolumab 1 mg/kg every 3 weeks for 4 cycles is recommended.  Complication associated with this therapy include nausea, fatigue and immune mediated complications which could be problematic.  These complications such as pneumonitis, colitis and thyroid disease at times could be life-threatening.  BRAF targeted therapy could be an alternative if she harbors the appropriate mutation.  2.  IV access: Peripheral veins will be in use for the time being.  Port-A-Cath option will be  deferred.  3.  Antiemetics: Prescription for Compazine will be made available to her.   4.  Autoimmune complications: We will continue to monitor closely including pneumonitis, colitis, hypophysitis among others.   5.  CNS staging: We will obtain MRI of the brain near future to complete her staging work-up.   6.  Back pain:   7.  Treatment goal and prognosis: Therapy is palliative although aggressive measures are warranted given her excellent performance status.  8.  Follow-up: In the near future to start therapy.   40  minutes were dedicated to this visit. The time was spent on reviewing laboratory data, imaging studies, discussing treatment options, and addressing complications related to her cancer and cancer therapy and answering questions regarding future plan.   Zola Button, MD 02/21/2021 8:17 AM

## 2021-02-21 NOTE — Progress Notes (Signed)
DISCONTINUE ON PATHWAY REGIMEN - Melanoma     A cycle is every 21 days:     Ipilimumab        Dose Mod: None     Ipilimumab        Dose Mod: None  **Always confirm dose/schedule in your pharmacy ordering system**  REASON: Disease Progression PRIOR TREATMENT: [id number]: Ipilimumab 10 mg/kg q21 Days x 4 Doses, then q12 Weeks for up to 3 Years TREATMENT RESPONSE: Complete Response (CR)  START ON PATHWAY REGIMEN - Melanoma and Other Skin Cancers     Cycles 1 through 4: A cycle is every 21 days:     Nivolumab      Ipilimumab    Cycles 5 and beyond: A cycle is every 14 days:     Nivolumab   **Always confirm dose/schedule in your pharmacy ordering system**  Patient Characteristics: Melanoma, Cutaneous/Unknown Primary, Distant Metastases or Unresectable Local Recurrence, Unresectable, Symptomatic, First Line, BRAF V600 Wild Type / BRAF V600 Results Pending or Unknown, Candidate for Immunotherapy Disease Classification: Melanoma Disease Subtype: Cutaneous BRAF V600 Mutation Status: Awaiting BRAF V600 Results Therapeutic Status: Distant Metastases Metastatic Disease Type: Symptomatic Line of Therapy: First Line Immunotherapy Candidate Status: Candidate for Immunotherapy Intent of Therapy: Non-Curative / Palliative Intent, Discussed with Patient

## 2021-02-23 ENCOUNTER — Other Ambulatory Visit: Payer: Self-pay | Admitting: Oncology

## 2021-02-23 ENCOUNTER — Telehealth: Payer: Self-pay | Admitting: *Deleted

## 2021-02-23 MED ORDER — HYDROCODONE-ACETAMINOPHEN 5-325 MG PO TABS: 1.0000 | ORAL_TABLET | Freq: Four times a day (QID) | ORAL | 0 refills | Status: DC | PRN

## 2021-02-23 NOTE — Telephone Encounter (Signed)
Contacted patient to inform that prescription for new pain medication sent to pharmacy. Instructed patient to follow directions with new medication and to not take any more tramadol. Patient verbalized understanding

## 2021-02-23 NOTE — Telephone Encounter (Signed)
Rx for Norco sent.

## 2021-02-23 NOTE — Telephone Encounter (Signed)
Patient called to confirm upcoming appts on 6/8 and 6/29, stated she saw them in Mychart and wanted to make sure. Confirmed appt times - patient verbalized understanding

## 2021-02-23 NOTE — Telephone Encounter (Signed)
Patient called. She stated Tramadol is making her sick/nauseated and requested a different pain medication. Call routed to Dr. Alen Blew.

## 2021-02-25 NOTE — Progress Notes (Signed)
Pharmacist Chemotherapy Monitoring - Initial Assessment    Anticipated start date: 03/04/21   Regimen:  . Are orders appropriate based on the patient's diagnosis, regimen, and cycle? Yes  . Does the plan date match the patient's scheduled date? Yes . Is the sequencing of drugs appropriate? Yes . Are the premedications appropriate for the patient's regimen? Yes . Prior Authorization for treatment is: Pending o If applicable, is the correct biosimilar selected based on the patient's insurance? not applicable  Organ Function and Labs: Marland Kitchen Are dose adjustments needed based on the patient's renal function, hepatic function, or hematologic function? Yes . Are appropriate labs ordered prior to the start of patient's treatment? Yes . Other organ system assessment, if indicated: N/A . The following baseline labs, if indicated, have been ordered: ipilimumab: baseline TSH +/- T4 and nivolumab: baseline TSH +/- T4  Dose Assessment: . Are the drug doses appropriate? Yes . Are the following correct: o Drug concentrations Yes o IV fluid compatible with drug Yes o Administration routes Yes o Timing of therapy Yes . If applicable, does the patient have documented access for treatment and/or plans for port-a-cath placement? yes . If applicable, have lifetime cumulative doses been properly documented and assessed? not applicable Lifetime Dose Tracking  No doses have been documented on this patient for the following tracked chemicals: Doxorubicin, Epirubicin, Idarubicin, Daunorubicin, Mitoxantrone, Bleomycin, Oxaliplatin, Carboplatin, Liposomal Doxorubicin  o   Toxicity Monitoring/Prevention: . The patient has the following take home antiemetics prescribed: N/A . The patient has the following take home medications prescribed: N/A . Medication allergies and previous infusion related reactions, if applicable, have been reviewed and addressed. Yes . The patient's current medication list has been assessed  for drug-drug interactions with their chemotherapy regimen. no significant drug-drug interactions were identified on review.  Order Review: . Are the treatment plan orders signed? Yes . Is the patient scheduled to see a provider prior to their treatment? Yes  I verify that I have reviewed each item in the above checklist and answered each question accordingly.  Alessa Mazur K, [place], 02/25/2021  11:38 AM

## 2021-03-01 ENCOUNTER — Other Ambulatory Visit: Payer: Self-pay

## 2021-03-01 ENCOUNTER — Ambulatory Visit (HOSPITAL_COMMUNITY)
Admission: RE | Admit: 2021-03-01 | Discharge: 2021-03-01 | Disposition: A | Discharge status: Home / Self Care | Payer: BC Managed Care – PPO | Source: Ambulatory Visit | Attending: Oncology | Admitting: Oncology

## 2021-03-01 DIAGNOSIS — C439 Malignant melanoma of skin, unspecified: Secondary | ICD-10-CM | POA: Insufficient documentation

## 2021-03-01 DIAGNOSIS — G9389 Other specified disorders of brain: Secondary | ICD-10-CM | POA: Diagnosis not present

## 2021-03-01 IMAGING — MR MR HEAD WO/W CM
15 series · 48 of 48 positions shown · IV contrast (gadavist)
Comparison: None.

CLINICAL DATA: Skin cancer, assess treatment response. Stage IV
melanoma.

EXAM:
MRI HEAD WITHOUT AND WITH CONTRAST
TECHNIQUE: Multiplanar, multiecho pulse sequences of the brain and surrounding
structures were obtained without and with intravenous contrast.
CONTRAST:  7mL GADAVIST GADOBUTROL 1 MMOL/ML IV SOLN

[Series 5: DWI · axial · 3.0mm · 1.36mm/px · z∈[-49,+97]mm · 5 of 100 slices shown (1 of 2)]
[im 1/100]
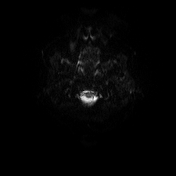
[im 25/100]
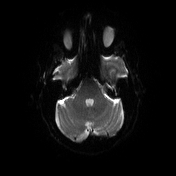
[im 50/100]
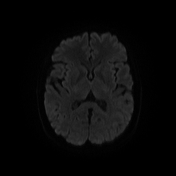
[im 75/100]
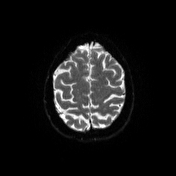
[im 100/100]
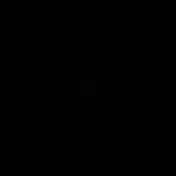

[Series 6: DWI · axial · 3.0mm · 1.36mm/px · z∈[-49,+97]mm · 3 of 50 slices shown (2 of 2)]
[im 1/50]
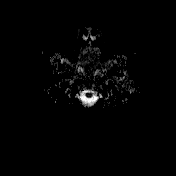
[im 25/50]
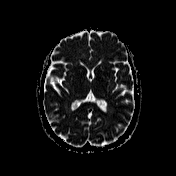
[im 50/50]
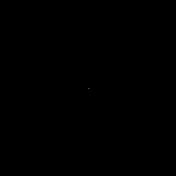

[Series 7: T1 · sagittal · 5.0mm · 0.75mm/px · 1 of 24 slices shown (1 of 2)]
[im 1/24]
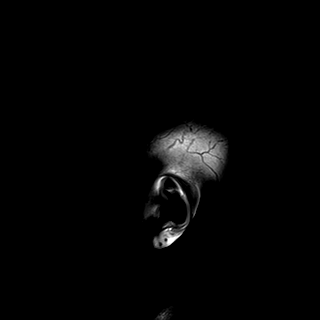

[Series 8: T2 · axial · 5.0mm · 0.62mm/px · 1 of 24 slices shown]
[im 1/24]
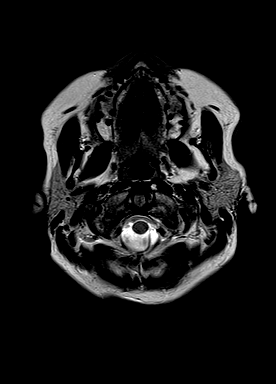

[Series 9: mip_images(sw) · axial · 24.0mm · 0.75mm/px · z∈[-47,+85]mm · 3 of 45 slices shown]
[im 1/45]
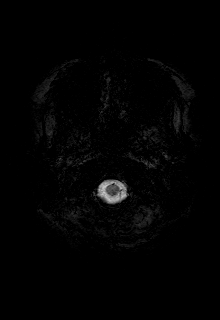
[im 23/45]
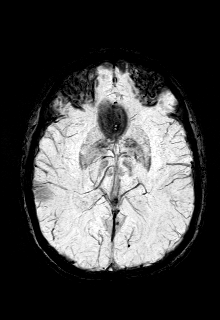
[im 45/45]
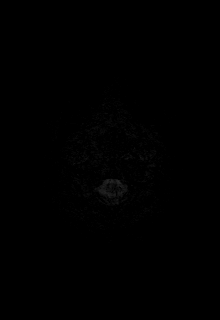

[Series 10: swi_images · axial · 3.0mm · 0.75mm/px · z∈[-57,+95]mm · 3 of 52 slices shown]
[im 1/52]
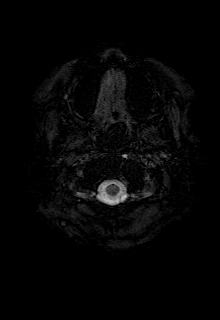
[im 26/52]
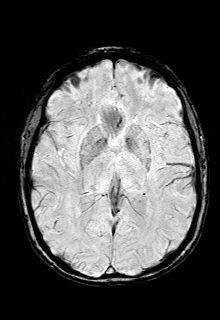
[im 52/52]
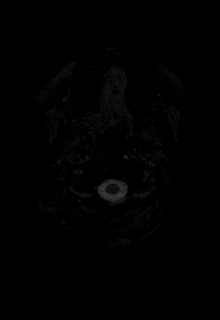

[Series 11: FLAIR · axial · 3.0mm · 0.75mm/px · z∈[-57,+96]mm · 3 of 52 slices shown]
[im 1/52]
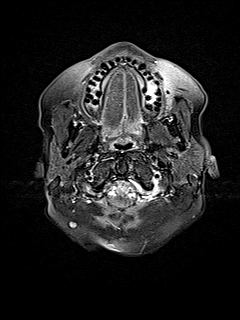
[im 26/52]
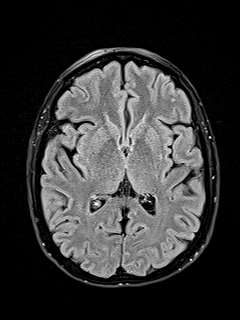
[im 52/52]
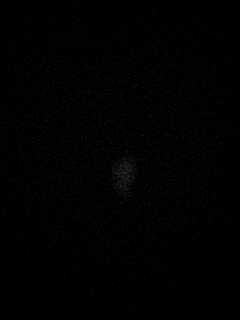

[Series 12: T1 · axial · 1.0mm · 0.94mm/px · z∈[-60,+98]mm · 9 of 160 slices shown (2 of 2)]
[im 1/160]
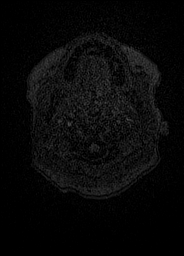
[im 20/160]
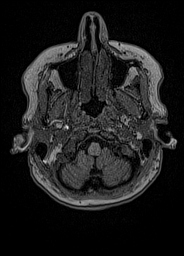
[im 40/160]
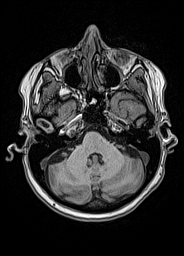
[im 60/160]
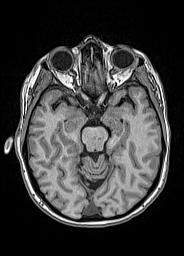
[im 80/160]
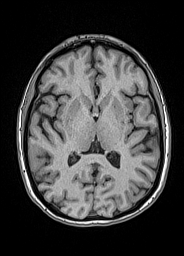
[im 100/160]
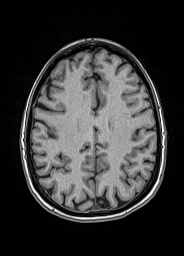
[im 120/160]
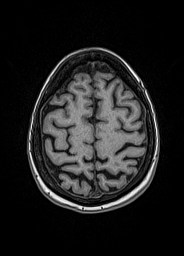
[im 140/160]
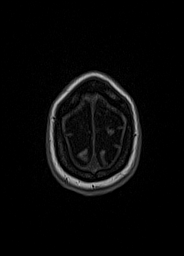
[im 160/160]
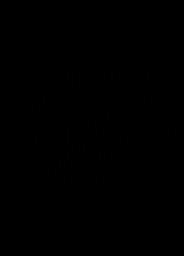

[Series 13: cor dwi_tracew · coronal · 5.0mm · 1.53mm/px · 3 of 50 slices shown]
[im 1/50]
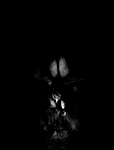
[im 25/50]
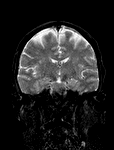
[im 50/50]
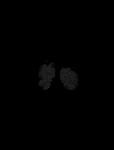

[Series 14: cor dwi_adc · coronal · 5.0mm · 1.53mm/px · 1 of 25 slices shown]
[im 1/25]
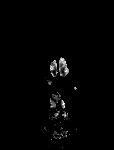

[Series 15: T2 post-contrast · coronal · 5.0mm · 0.57mm/px · 2 of 27 slices shown (1 of 2)]
[im 1/27]
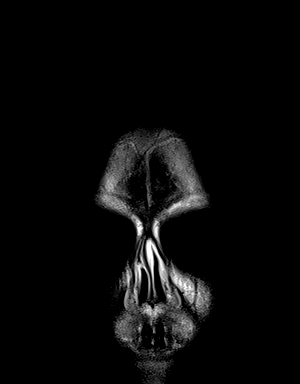
[im 27/27]
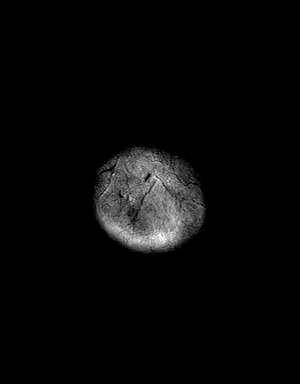

[Series 16: T1 post-contrast · axial · 1.0mm · 0.94mm/px · z∈[-60,+98]mm · 9 of 160 slices shown (1 of 3)]
[im 1/160]
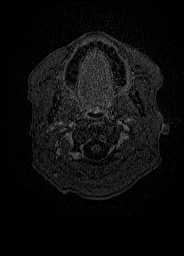
[im 20/160]
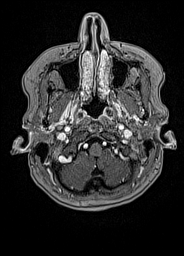
[im 40/160]
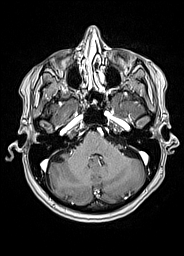
[im 60/160]
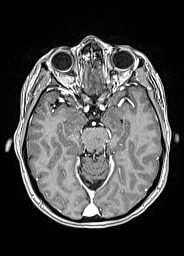
[im 80/160]
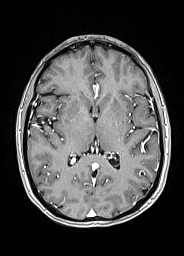
[im 100/160]
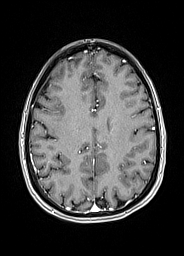
[im 120/160]
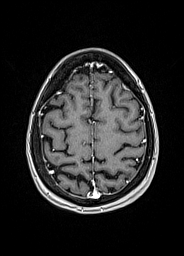
[im 140/160]
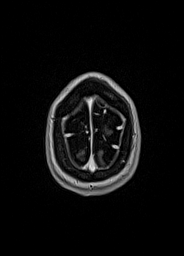
[im 160/160]
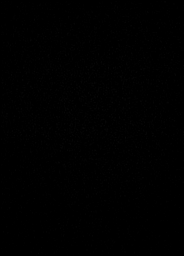

[Series 17: T1 post-contrast · coronal · 5.0mm · 0.43mm/px · 2 of 27 slices shown (2 of 3)]
[im 1/27]
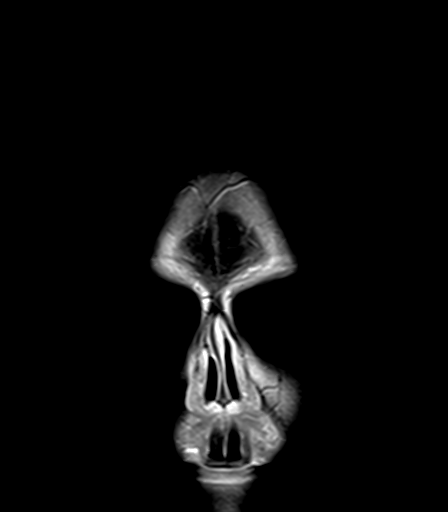
[im 27/27]
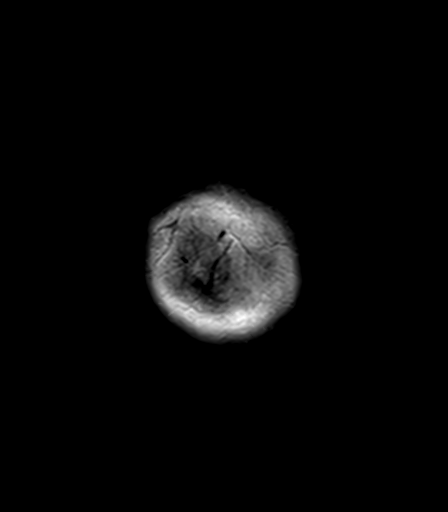

[Series 18: T1 post-contrast · sagittal · 5.0mm · 0.75mm/px · 1 of 24 slices shown (3 of 3)]
[im 1/24]
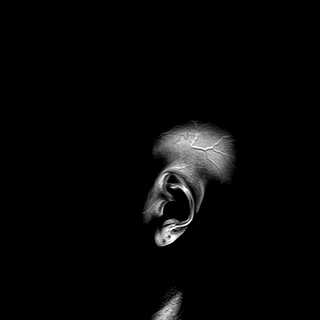

[Series 19: T2 post-contrast · coronal · 5.0mm · 0.69mm/px · 2 of 27 slices shown (2 of 2)]
[im 1/27]
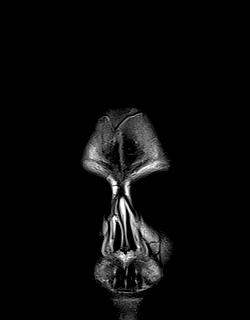
[im 27/27]
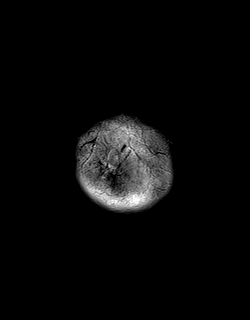

[48 of 48 positions shown; findings below may reference images not displayed]

FINDINGS: Brain: No acute infarction, hemorrhage, hydrocephalus, extra-axial
collection or mass lesion. Few scattered foci of T2 hyperintensity
are seen within the white matter of the cerebral hemispheres,
nonspecific. No focus of abnormal contrast enhancement.

Vascular: Normal flow voids.

Skull and upper cervical spine: Normal marrow signal.

Sinuses/Orbits: Negative.

Other: Right mastoid effusion.
IMPRESSION: 1. No evidence of intracranial metastatic disease.
2. Small amount of nonspecific T2 hyperintense lesions the white,
may represent early microvascular ischemic changes.

## 2021-03-01 MED ORDER — GADOBUTROL 1 MMOL/ML IV SOLN
7.0000 mL | Freq: Once | INTRAVENOUS | Status: AC | PRN
  Administered 2021-03-01: 7 mL via INTRAVENOUS

## 2021-03-02 ENCOUNTER — Telehealth: Payer: Self-pay | Admitting: *Deleted

## 2021-03-02 NOTE — Telephone Encounter (Signed)
-----   Message from Wyatt Portela, MD sent at 03/02/2021 10:36 AM EDT ----- Please let her know her MRI shows no cancer.

## 2021-03-02 NOTE — Telephone Encounter (Signed)
PC to patient, informed her her recent brain MRI shows no evidence of cancer as requested by Dr. Alen Blew.  Patient verbalizes understanding.

## 2021-03-04 ENCOUNTER — Inpatient Hospital Stay: Payer: BC Managed Care – PPO

## 2021-03-04 ENCOUNTER — Other Ambulatory Visit: Payer: Self-pay

## 2021-03-04 VITALS — BP 108/75 | HR 82 | Temp 98.2°F | Resp 17

## 2021-03-04 DIAGNOSIS — Z5112 Encounter for antineoplastic immunotherapy: Secondary | ICD-10-CM | POA: Diagnosis not present

## 2021-03-04 DIAGNOSIS — C4370 Malignant melanoma of unspecified lower limb, including hip: Secondary | ICD-10-CM | POA: Diagnosis not present

## 2021-03-04 DIAGNOSIS — C439 Malignant melanoma of skin, unspecified: Secondary | ICD-10-CM

## 2021-03-04 DIAGNOSIS — Z79899 Other long term (current) drug therapy: Secondary | ICD-10-CM | POA: Diagnosis not present

## 2021-03-04 LAB — CBC WITH DIFFERENTIAL (CANCER CENTER ONLY)
Abs Immature Granulocytes: 0.02 10*3/uL (ref 0.00–0.07)
Basophils Absolute: 0 10*3/uL (ref 0.0–0.1)
Basophils Relative: 0 %
Eosinophils Absolute: 0.1 10*3/uL (ref 0.0–0.5)
Eosinophils Relative: 1 %
HCT: 33.1 % — ABNORMAL LOW (ref 36.0–46.0)
Hemoglobin: 10.8 g/dL — ABNORMAL LOW (ref 12.0–15.0)
Immature Granulocytes: 0 %
Lymphocytes Relative: 17 %
Lymphs Abs: 1.1 10*3/uL (ref 0.7–4.0)
MCH: 26.2 pg (ref 26.0–34.0)
MCHC: 32.6 g/dL (ref 30.0–36.0)
MCV: 80.3 fL (ref 80.0–100.0)
Monocytes Absolute: 0.5 10*3/uL (ref 0.1–1.0)
Monocytes Relative: 8 %
Neutro Abs: 4.7 10*3/uL (ref 1.7–7.7)
Neutrophils Relative %: 74 %
Platelet Count: 273 10*3/uL (ref 150–400)
RBC: 4.12 MIL/uL (ref 3.87–5.11)
RDW: 14.6 % (ref 11.5–15.5)
WBC Count: 6.4 10*3/uL (ref 4.0–10.5)
nRBC: 0 % (ref 0.0–0.2)

## 2021-03-04 LAB — CMP (CANCER CENTER ONLY)
ALT: 26 U/L (ref 0–44)
AST: 24 U/L (ref 15–41)
Albumin: 3.3 g/dL — ABNORMAL LOW (ref 3.5–5.0)
Alkaline Phosphatase: 73 U/L (ref 38–126)
Anion gap: 11 (ref 5–15)
BUN: 11 mg/dL (ref 8–23)
CO2: 24 mmol/L (ref 22–32)
Calcium: 9.6 mg/dL (ref 8.9–10.3)
Chloride: 100 mmol/L (ref 98–111)
Creatinine: 0.62 mg/dL (ref 0.44–1.00)
GFR, Estimated: >60 mL/min (ref >60)
Glucose, Bld: 102 mg/dL — ABNORMAL HIGH (ref 70–99)
Potassium: 4.2 mmol/L (ref 3.5–5.1)
Sodium: 135 mmol/L (ref 135–145)
Total Bilirubin: 0.2 mg/dL — ABNORMAL LOW (ref 0.3–1.2)
Total Protein: 7.5 g/dL (ref 6.5–8.1)

## 2021-03-04 LAB — TSH: TSH: 0.921 u[IU]/mL (ref 0.308–3.960)

## 2021-03-04 MED ORDER — SODIUM CHLORIDE 0.9 % IV SOLN
3.0000 mg/kg | Freq: Once | INTRAVENOUS | Status: AC
  Administered 2021-03-04: 200 mg via INTRAVENOUS
  Filled 2021-03-04: qty 40

## 2021-03-04 MED ORDER — SODIUM CHLORIDE 0.9 % IV SOLN
1.0000 mg/kg | Freq: Once | INTRAVENOUS | Status: AC
  Administered 2021-03-04: 68 mg via INTRAVENOUS
  Filled 2021-03-04: qty 6.8

## 2021-03-04 MED ORDER — SODIUM CHLORIDE 0.9 % IV SOLN
Freq: Once | INTRAVENOUS | Status: AC
  Filled 2021-03-04: qty 250

## 2021-03-04 MED ORDER — DIPHENHYDRAMINE HCL 25 MG PO CAPS
ORAL_CAPSULE | ORAL | Status: AC
  Filled 2021-03-04: qty 2

## 2021-03-04 MED ORDER — DIPHENHYDRAMINE HCL 50 MG/ML IJ SOLN
INTRAMUSCULAR | Status: AC
  Filled 2021-03-04: qty 1

## 2021-03-04 MED ORDER — DIPHENHYDRAMINE HCL 50 MG/ML IJ SOLN
25.0000 mg | Freq: Once | INTRAMUSCULAR | Status: AC
  Administered 2021-03-04: 25 mg via INTRAVENOUS

## 2021-03-04 MED ORDER — FAMOTIDINE 20 MG IN NS 100 ML IVPB
INTRAVENOUS | Status: AC
  Filled 2021-03-04: qty 100

## 2021-03-04 MED ORDER — FAMOTIDINE 20 MG IN NS 100 ML IVPB
20.0000 mg | Freq: Once | INTRAVENOUS | Status: AC
Start: 2021-03-04 — End: 2021-03-04
  Administered 2021-03-04: 20 mg via INTRAVENOUS

## 2021-03-04 NOTE — Progress Notes (Signed)

## 2021-03-04 NOTE — Patient Instructions (Signed)
[facility]  Discharge Instructions: Thank you for choosing [facility] to provide your oncology and hematology care.   If you have a lab appointment with the Cancer Center, please go directly to the Cancer Center and check in at the registration area.   Wear comfortable clothing and clothing appropriate for easy access to any Portacath or PICC line.   We strive to give you quality time with your provider. You may need to reschedule your appointment if you arrive late (15 or more minutes).  Arriving late affects you and other patients whose appointments are after yours.  Also, if you miss three or more appointments without notifying the office, you may be dismissed from the clinic at the provider's discretion.      For prescription refill requests, have your pharmacy contact our office and allow 72 hours for refills to be completed.    Today you received the following chemotherapy and/or immunotherapy agents Nivolumab, Ipilimumab   To help prevent nausea and vomiting after your treatment, we encourage you to take your nausea medication as directed.  BELOW ARE SYMPTOMS THAT SHOULD BE REPORTED IMMEDIATELY: *FEVER GREATER THAN 100.4 F (38 C) OR HIGHER *CHILLS OR SWEATING *NAUSEA AND VOMITING THAT IS NOT CONTROLLED WITH YOUR NAUSEA MEDICATION *UNUSUAL SHORTNESS OF BREATH *UNUSUAL BRUISING OR BLEEDING *URINARY PROBLEMS (pain or burning when urinating, or frequent urination) *BOWEL PROBLEMS (unusual diarrhea, constipation, pain near the anus) TENDERNESS IN MOUTH AND THROAT WITH OR WITHOUT PRESENCE OF ULCERS (sore throat, sores in mouth, or a toothache) UNUSUAL RASH, SWELLING OR PAIN  UNUSUAL VAGINAL DISCHARGE OR ITCHING   Items with * indicate a potential emergency and should be followed up as soon as possible or go to the Emergency Department if any problems should occur.  Please show the CHEMOTHERAPY ALERT CARD or IMMUNOTHERAPY ALERT CARD at  check-in to the Emergency Department and triage nurse.  Should you have questions after your visit or need to cancel or reschedule your appointment, please contact [facility]  Dept: [phone]  and follow the prompts.  Office hours are 8:00 a.m. to 4:30 p.m. Monday - Friday. Please note that voicemails left after 4:00 p.m. may not be returned until the following business day.  We are closed weekends and major holidays. You have access to a nurse at all times for urgent questions. Please call the main number to the clinic Dept: [phone] and follow the prompts.   For any non-urgent questions, you may also contact your provider using MyChart. We now offer e-Visits for anyone 18 and older to request care online for non-urgent symptoms. For details visit [email or web address].   Also download the MyChart app! Go to the app store, search "MyChart", open the app, select , and log in with your MyChart username and password.  Due to Covid, a mask is required upon entering the hospital/clinic. If you do not have a mask, one will be given to you upon arrival. For doctor visits, patients may have 1 support person aged 18 or older with them. For treatment visits, patients cannot have anyone with them due to current Covid guidelines and our immunocompromised population.   Nivolumab injection What is this medicine? NIVOLUMAB (nye VOL ue mab) is a monoclonal antibody. It treats certain types of cancer. Some of the cancers treated are colon cancer, head and neck cancer, Hodgkin lymphoma, lung cancer, and melanoma. This medicine may be used for other purposes; ask your health care   provider or pharmacist if you have questions. COMMON BRAND NAME(S): Opdivo What should I tell my health care provider before I take this medicine? They need to know if you have any of these conditions: autoimmune diseases like Crohn's disease, ulcerative colitis, or lupus have had or  planning to have an allogeneic stem cell transplant (uses someone else's stem cells) history of chest radiation history of organ transplant nervous system problems like myasthenia gravis or Guillain-Barre syndrome an unusual or allergic reaction to nivolumab, other medicines, foods, dyes, or preservatives pregnant or trying to get pregnant breast-feeding How should I use this medicine? This medicine is for infusion into a vein. It is given by a health care professional in a hospital or clinic setting. A special MedGuide will be given to you before each treatment. Be sure to read this information carefully each time. Talk to your pediatrician regarding the use of this medicine in children. While this drug may be prescribed for children as young as 12 years for selected conditions, precautions do apply. Overdosage: If you think you have taken too much of this medicine contact a poison control center or emergency room at once. NOTE: This medicine is only for you. Do not share this medicine with others. What if I miss a dose? It is important not to miss your dose. Call your doctor or health care professional if you are unable to keep an appointment. What may interact with this medicine? Interactions have not been studied. This list may not describe all possible interactions. Give your health care provider a list of all the medicines, herbs, non-prescription drugs, or dietary supplements you use. Also tell them if you smoke, drink alcohol, or use illegal drugs. Some items may interact with your medicine. What should I watch for while using this medicine? This drug may make you feel generally unwell. Continue your course of treatment even though you feel ill unless your doctor tells you to stop. You may need blood work done while you are taking this medicine. Do not become pregnant while taking this medicine or for 5 months after stopping it. Women should inform their doctor if they wish to become  pregnant or think they might be pregnant. There is a potential for serious side effects to an unborn child. Talk to your health care professional or pharmacist for more information. Do not breast-feed an infant while taking this medicine or for 5 months after stopping it. What side effects may I notice from receiving this medicine? Side effects that you should report to your doctor or health care professional as soon as possible: allergic reactions like skin rash, itching or hives, swelling of the face, lips, or tongue breathing problems blood in the urine bloody or watery diarrhea or black, tarry stools changes in emotions or moods changes in vision chest pain cough dizziness feeling faint or lightheaded, falls fever, chills headache with fever, neck stiffness, confusion, loss of memory, sensitivity to light, hallucination, loss of contact with reality, or seizures joint pain mouth sores redness, blistering, peeling or loosening of the skin, including inside the mouth severe muscle pain or weakness signs and symptoms of high blood sugar such as dizziness; dry mouth; dry skin; fruity breath; nausea; stomach pain; increased hunger or thirst; increased urination signs and symptoms of kidney injury like trouble passing urine or change in the amount of urine signs and symptoms of liver injury like dark yellow or brown urine; general ill feeling or flu-like symptoms; light-colored stools; loss of appetite; nausea; right   upper belly pain; unusually weak or tired; yellowing of the eyes or skin swelling of the ankles, feet, hands trouble passing urine or change in the amount of urine unusually weak or tired weight gain or loss Side effects that usually do not require medical attention (report to your doctor or health care professional if they continue or are bothersome): bone pain constipation decreased appetite diarrhea muscle pain nausea, vomiting tiredness This list may not describe all  possible side effects. Call your doctor for medical advice about side effects. You may report side effects to FDA at 1-800-FDA-1088. Where should I keep my medicine? This drug is given in a hospital or clinic and will not be stored at home. NOTE: This sheet is a summary. It may not cover all possible information. If you have questions about this medicine, talk to your doctor, pharmacist, or health care provider.  2021 Elsevier/Gold Standard (2020-02-04 10:08:25)  Ipilimumab injection What is this medicine? IPILIMUMAB (IP i LIM ue mab) is a monoclonal antibody. It is used to treat colorectal cancer, kidney cancer, liver cancer, lung cancer, melanoma, and mesothelioma. This medicine may be used for other purposes; ask your health care provider or pharmacist if you have questions. COMMON BRAND NAME(S): YERVOY What should I tell my health care provider before I take this medicine? They need to know if you have any of these conditions: autoimmune diseases like Crohn's disease, ulcerative colitis, or lupus have had or planning to have an allogeneic stem cell transplant (uses someone else's stem cells) history of organ transplant nervous system problems like myasthenia gravis or Guillain-Barre syndrome an unusual or allergic reaction to ipilimumab, other medicines, foods, dyes, or preservatives pregnant or trying to get pregnant breast-feeding How should I use this medicine? This medicine is for infusion into a vein. It is given by a health care professional in a hospital or clinic setting. A special MedGuide will be given to you before each treatment. Be sure to read this information carefully each time. Talk to your pediatrician regarding the use of this medicine in children. While this drug may be prescribed for children as young as 12 years for selected conditions, precautions do apply. Overdosage: If you think you have taken too much of this medicine contact a poison control center or  emergency room at once. NOTE: This medicine is only for you. Do not share this medicine with others. What if I miss a dose? It is important not to miss your dose. Call your doctor or health care professional if you are unable to keep an appointment. What may interact with this medicine? Interactions are not expected. This list may not describe all possible interactions. Give your health care provider a list of all the medicines, herbs, non-prescription drugs, or dietary supplements you use. Also tell them if you smoke, drink alcohol, or use illegal drugs. Some items may interact with your medicine. What should I watch for while using this medicine? Tell your doctor or healthcare professional if your symptoms do not start to get better or if they get worse. Do not become pregnant while taking this medicine or for 3 months after stopping it. Women should inform their doctor if they wish to become pregnant or think they might be pregnant. There is a potential for serious side effects to an unborn child. Talk to your health care professional or pharmacist for more information. Do not breast-feed an infant while taking this medicine or for 3 months after the last dose. Your condition will be   monitored carefully while you are receiving this medicine. You may need blood work done while you are taking this medicine. What side effects may I notice from receiving this medicine? Side effects that you should report to your doctor or health care professional as soon as possible: allergic reactions like skin rash, itching or hives, swelling of the face, lips, or tongue black, tarry stools bloody or watery diarrhea changes in vision dizziness eye pain fast, irregular heartbeat feeling anxious feeling faint or lightheaded, falls nausea, vomiting pain, tingling, numbness in the hands or feet redness, blistering, peeling or loosening of the skin, including inside the mouth signs and symptoms of liver injury  like dark yellow or brown urine; general ill feeling or flu-like symptoms; light-colored stools; loss of appetite; nausea; right upper belly pain; unusually weak or tired; yellowing of the eyes or skin unusual bleeding or bruising Side effects that usually do not require medical attention (report to your doctor or health care professional if they continue or are bothersome): headache loss of appetite trouble sleeping This list may not describe all possible side effects. Call your doctor for medical advice about side effects. You may report side effects to FDA at 1-800-FDA-1088. Where should I keep my medicine? This drug is given in a hospital or clinic and will not be stored at home. NOTE: This sheet is a summary. It may not cover all possible information. If you have questions about this medicine, talk to your doctor, pharmacist, or health care provider.  2021 Elsevier/Gold Standard (2019-09-03 18:53:00)    

## 2021-03-07 ENCOUNTER — Telehealth: Payer: Self-pay | Admitting: *Deleted

## 2021-03-07 ENCOUNTER — Emergency Department (HOSPITAL_COMMUNITY): Payer: BC Managed Care – PPO

## 2021-03-07 ENCOUNTER — Encounter (HOSPITAL_COMMUNITY): Payer: Self-pay | Admitting: Emergency Medicine

## 2021-03-07 ENCOUNTER — Emergency Department (HOSPITAL_COMMUNITY)
Admission: EM | Admit: 2021-03-07 | Discharge: 2021-03-07 | Disposition: A | Discharge status: Home / Self Care | Payer: BC Managed Care – PPO | Attending: Emergency Medicine | Admitting: Emergency Medicine

## 2021-03-07 ENCOUNTER — Other Ambulatory Visit: Payer: Self-pay

## 2021-03-07 DIAGNOSIS — D7389 Other diseases of spleen: Secondary | ICD-10-CM | POA: Diagnosis not present

## 2021-03-07 DIAGNOSIS — I700 Atherosclerosis of aorta: Secondary | ICD-10-CM | POA: Diagnosis not present

## 2021-03-07 DIAGNOSIS — K5900 Constipation, unspecified: Secondary | ICD-10-CM | POA: Diagnosis not present

## 2021-03-07 DIAGNOSIS — E039 Hypothyroidism, unspecified: Secondary | ICD-10-CM | POA: Insufficient documentation

## 2021-03-07 DIAGNOSIS — R1084 Generalized abdominal pain: Secondary | ICD-10-CM | POA: Diagnosis not present

## 2021-03-07 DIAGNOSIS — R110 Nausea: Secondary | ICD-10-CM | POA: Insufficient documentation

## 2021-03-07 DIAGNOSIS — Z7901 Long term (current) use of anticoagulants: Secondary | ICD-10-CM | POA: Diagnosis not present

## 2021-03-07 DIAGNOSIS — Z85820 Personal history of malignant melanoma of skin: Secondary | ICD-10-CM | POA: Diagnosis not present

## 2021-03-07 DIAGNOSIS — N049 Nephrotic syndrome with unspecified morphologic changes: Secondary | ICD-10-CM | POA: Diagnosis not present

## 2021-03-07 LAB — CBC WITH DIFFERENTIAL/PLATELET
Abs Immature Granulocytes: 0.04 10*3/uL (ref 0.00–0.07)
Basophils Absolute: 0 10*3/uL (ref 0.0–0.1)
Basophils Relative: 0 %
Eosinophils Absolute: 0.1 10*3/uL (ref 0.0–0.5)
Eosinophils Relative: 1 %
HCT: 33.2 % — ABNORMAL LOW (ref 36.0–46.0)
Hemoglobin: 10.8 g/dL — ABNORMAL LOW (ref 12.0–15.0)
Immature Granulocytes: 1 %
Lymphocytes Relative: 11 %
Lymphs Abs: 0.8 10*3/uL (ref 0.7–4.0)
MCH: 26.2 pg (ref 26.0–34.0)
MCHC: 32.5 g/dL (ref 30.0–36.0)
MCV: 80.4 fL (ref 80.0–100.0)
Monocytes Absolute: 0.7 10*3/uL (ref 0.1–1.0)
Monocytes Relative: 9 %
Neutro Abs: 6 10*3/uL (ref 1.7–7.7)
Neutrophils Relative %: 78 %
Platelets: 322 10*3/uL (ref 150–400)
RBC: 4.13 MIL/uL (ref 3.87–5.11)
RDW: 15 % (ref 11.5–15.5)
WBC: 7.6 10*3/uL (ref 4.0–10.5)
nRBC: 0 % (ref 0.0–0.2)

## 2021-03-07 LAB — COMPREHENSIVE METABOLIC PANEL
ALT: 26 U/L (ref 0–44)
AST: 24 U/L (ref 15–41)
Albumin: 3.3 g/dL — ABNORMAL LOW (ref 3.5–5.0)
Alkaline Phosphatase: 60 U/L (ref 38–126)
Anion gap: 8 (ref 5–15)
BUN: 9 mg/dL (ref 8–23)
CO2: 26 mmol/L (ref 22–32)
Calcium: 9.2 mg/dL (ref 8.9–10.3)
Chloride: 98 mmol/L (ref 98–111)
Creatinine, Ser: 0.48 mg/dL (ref 0.44–1.00)
GFR, Estimated: >60 mL/min (ref >60)
Glucose, Bld: 95 mg/dL (ref 70–99)
Potassium: 4.4 mmol/L (ref 3.5–5.1)
Sodium: 132 mmol/L — ABNORMAL LOW (ref 135–145)
Total Bilirubin: 0.4 mg/dL (ref 0.3–1.2)
Total Protein: 7.3 g/dL (ref 6.5–8.1)

## 2021-03-07 LAB — URINALYSIS, ROUTINE W REFLEX MICROSCOPIC
Bilirubin Urine: NEGATIVE
Glucose, UA: NEGATIVE mg/dL
Ketones, ur: NEGATIVE mg/dL
Leukocytes,Ua: NEGATIVE
Nitrite: NEGATIVE
Protein, ur: NEGATIVE mg/dL
Specific Gravity, Urine: 1.005 (ref 1.005–1.030)
pH: 7 (ref 5.0–8.0)

## 2021-03-07 LAB — LIPASE, BLOOD: Lipase: 25 U/L (ref 11–51)

## 2021-03-07 IMAGING — CT CT ABD-PELV W/O CM
2 of 4 series · 16 of 46 positions shown, 18 images · non-contrast
Comparison: PET [DATE] and CT abdomen pelvis [DATE].

CLINICAL DATA: Abdominal pain for 6 weeks.  History of melanoma.

EXAM:
CT ABDOMEN AND PELVIS WITHOUT CONTRAST
TECHNIQUE: Multidetector CT imaging of the abdomen and pelvis was performed
following the standard protocol without IV contrast.

[Series 2: axial st · axial · 0.92mm/px · z∈[-799,-369]mm · 13 of 98 slices shown, 15 images]
[im 6/98  soft-tissue]
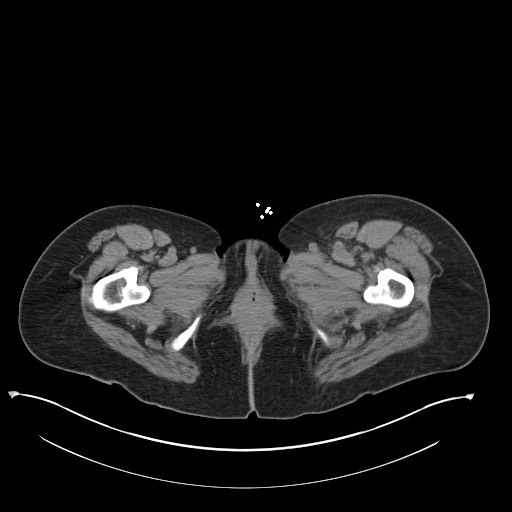
[im 6/98  bone]
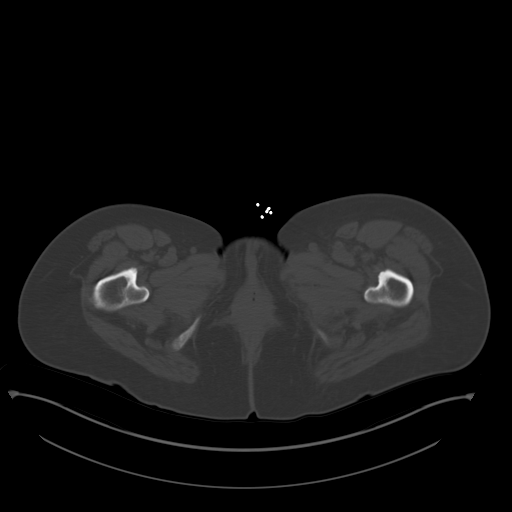
[im 16/98  soft-tissue]
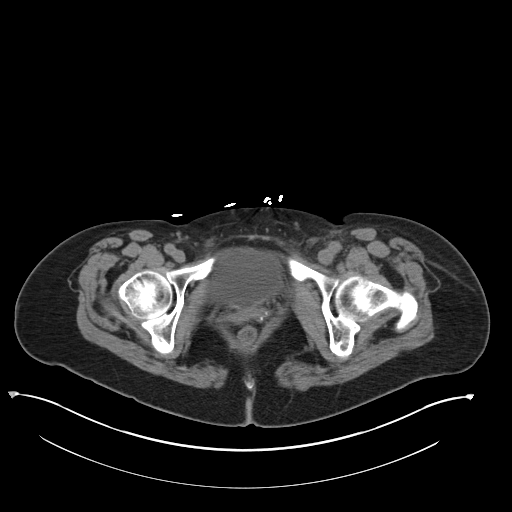
[im 21/98  soft-tissue]
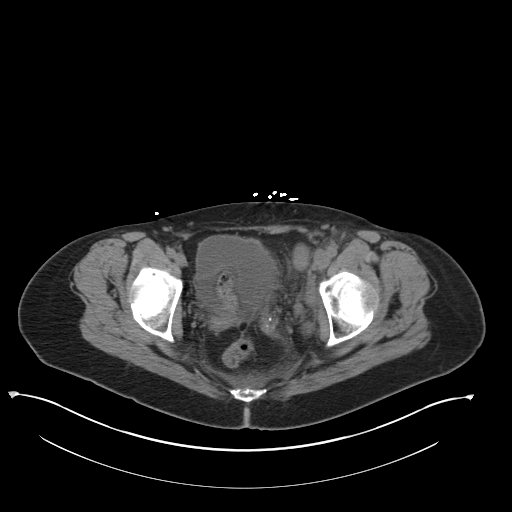
[im 26/98  soft-tissue]
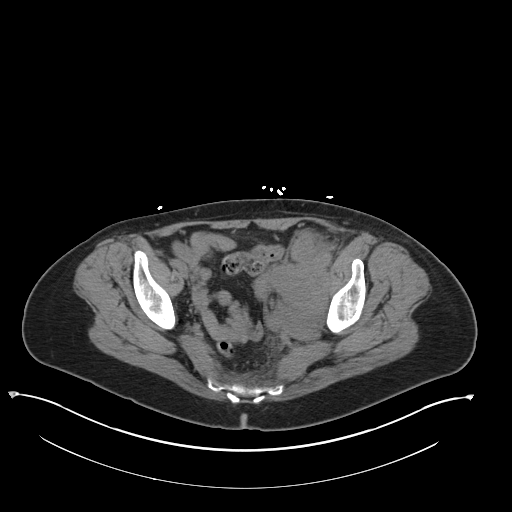
[im 36/98  soft-tissue]
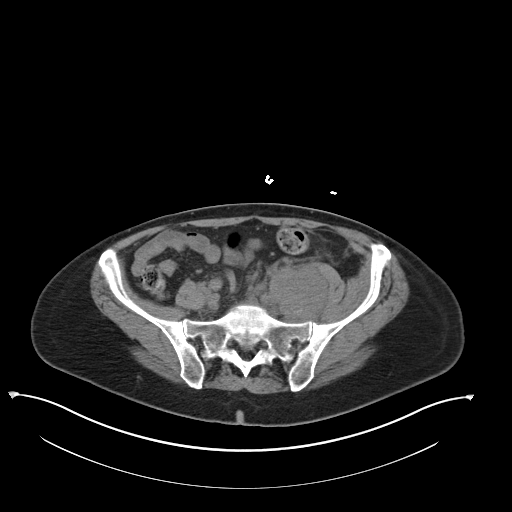
[im 41/98  soft-tissue]
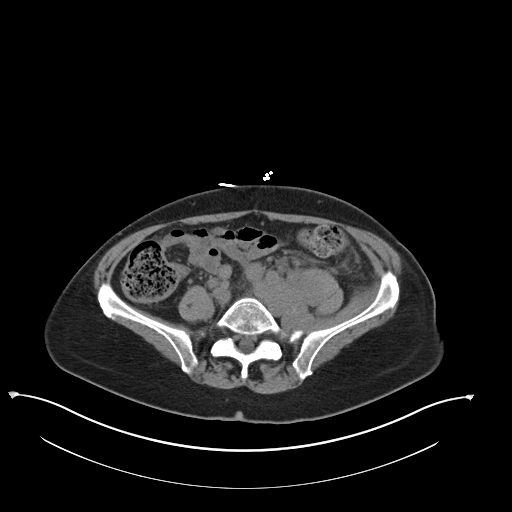
[im 52/98  soft-tissue]
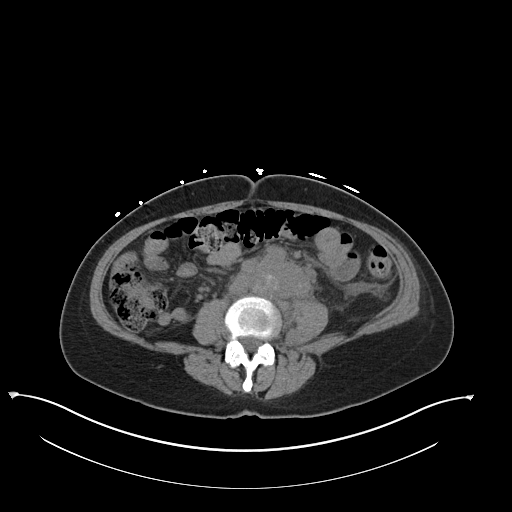
[im 57/98  soft-tissue]
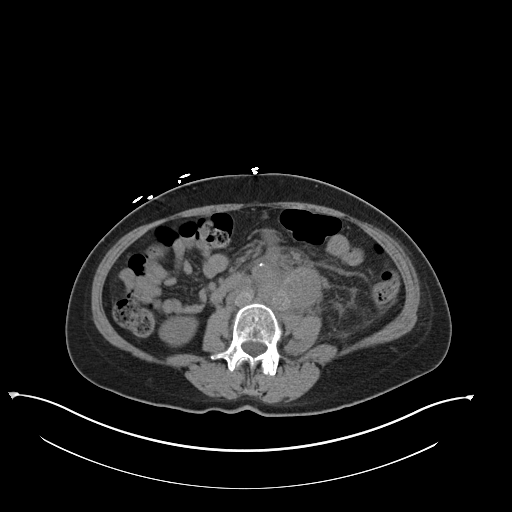
[im 62/98  soft-tissue]
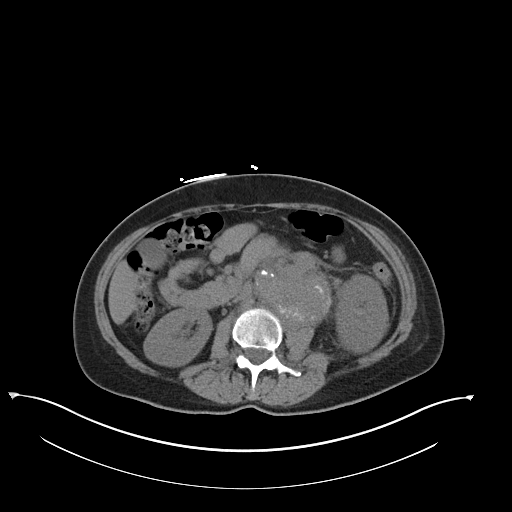
[im 62/98  bone]
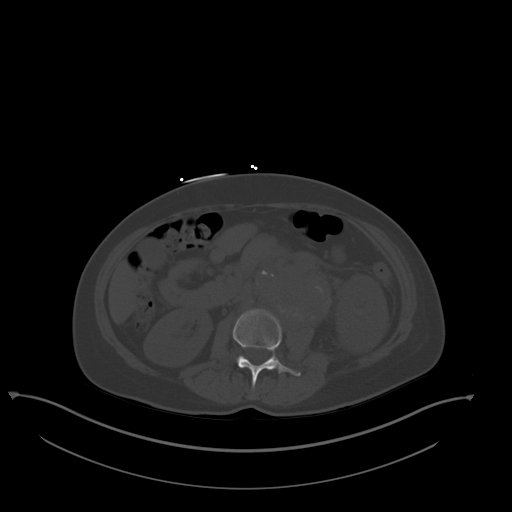
[im 72/98  soft-tissue]
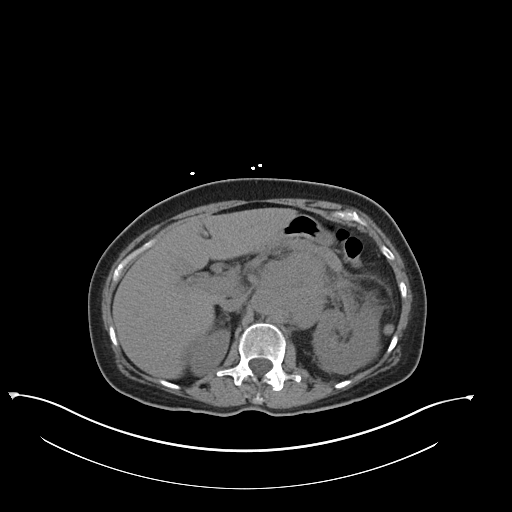
[im 77/98  soft-tissue]
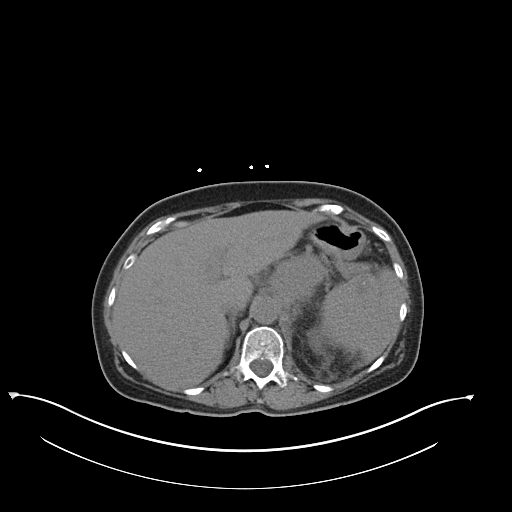
[im 82/98  soft-tissue]
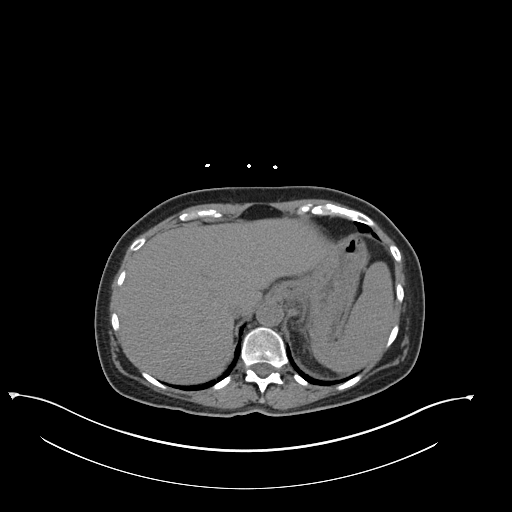
[im 92/98  soft-tissue]
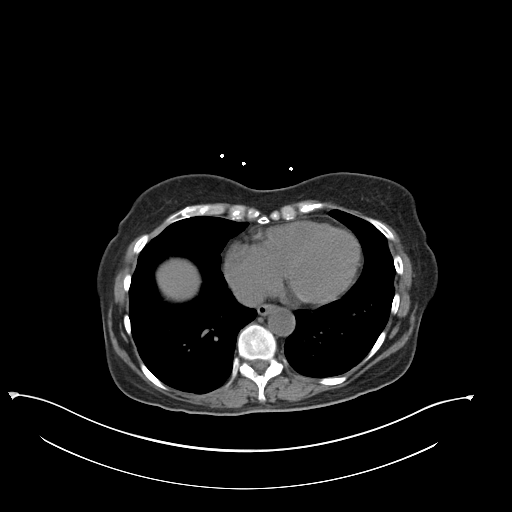

[Series 5: coronal st · coronal · 0.91mm/px · 3 of 151 slices shown]
[im 51/151  soft-tissue]
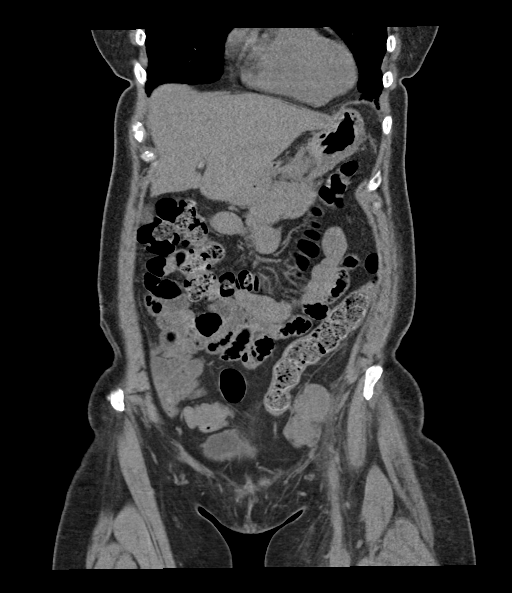
[im 67/151  soft-tissue]
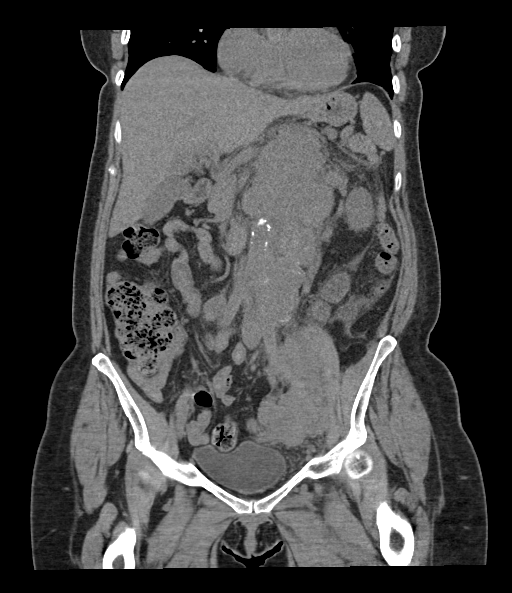
[im 84/151  soft-tissue]
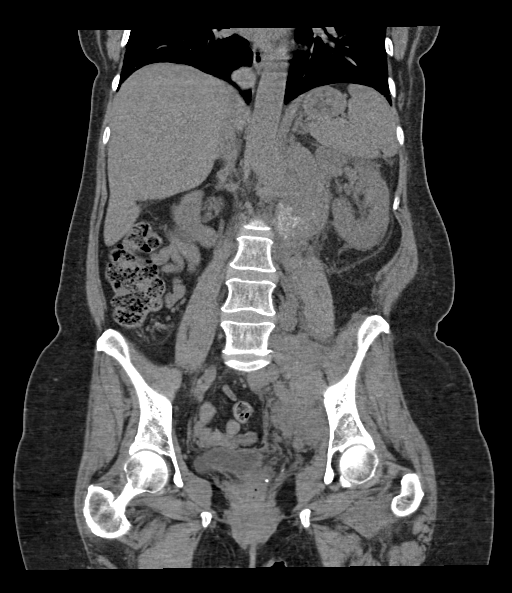

[16 of 46 positions shown; findings below may reference images not displayed]

FINDINGS: Lower chest: Patchy areas of mild bronchiectasis, ground-glass and
slight architectural distortion in the lung bases. Heart size
normal. No pericardial or pleural effusion. Distal esophagus is
unremarkable.

Hepatobiliary: Liver and gallbladder are unremarkable. No biliary
ductal dilatation.

Pancreas: Negative.

Spleen: Subtle low-attenuation lesion in the inferior spleen
measures 1.8 cm, similar.

Adrenals/Urinary Tract: Adrenal glands and right kidney are
unremarkable. New left perinephric edema. Ureters are decompressed.
Bladder is grossly unremarkable.

Stomach/Bowel: Stomach, small bowel, appendix and colon are
unremarkable.

Vascular/Lymphatic: Atherosclerotic calcification of the aorta.
Extensive retroperitoneal adenopathy in the abdomen and pelvis.
Index partially calcified conglomerate nodal mass in the left
periaortic station has enlarged, now measuring 7.7 x 9.0 cm ([DATE]),
compared to 7.3 x 7.4 cm on [DATE]. Adenopathy extends posterior
to the abdominal aorta, displacing it anteriorly. Enlarging
bilateral iliac chain adenopathy, left greater than right. Index
left common iliac nodal mass measures 5.6 x 5.6 cm (2/62), compared
to 2.8 x 3.8 cm on [DATE].

Reproductive: Hysterectomy.  No adnexal mass.

Other: Presacral edema/fluid.  Fluid in the left paracolic gutter.

Musculoskeletal: No worrisome lytic or sclerotic lesions.
IMPRESSION: 1. Enlarging abdominal and retroperitoneal adenopathy, consistent
with metastatic melanoma.
2. New left perinephric edema may indicate spread to the adjacent
left renal capsule. Subtle low-attenuation lesion in the spleen,
indeterminate. Continued attention on follow-up is recommended.
3. Sequelae of post [0H] pneumonia in the visualized lung bases.
4.  Aortic atherosclerosis ([0H]-[0H]).

## 2021-03-07 MED ORDER — SODIUM CHLORIDE 0.9 % IV BOLUS
500.0000 mL | Freq: Once | INTRAVENOUS | Status: AC
  Administered 2021-03-07: 500 mL via INTRAVENOUS

## 2021-03-07 MED ORDER — ONDANSETRON HCL 4 MG/2ML IJ SOLN
4.0000 mg | Freq: Once | INTRAMUSCULAR | Status: AC
  Administered 2021-03-07: 4 mg via INTRAVENOUS
  Filled 2021-03-07: qty 2

## 2021-03-07 MED ORDER — FENTANYL CITRATE (PF) 100 MCG/2ML IJ SOLN
50.0000 ug | Freq: Once | INTRAMUSCULAR | Status: AC
  Administered 2021-03-07: 50 ug via INTRAVENOUS
  Filled 2021-03-07: qty 2

## 2021-03-07 NOTE — ED Provider Notes (Signed)
[facility] DEPT Provider Note   CSN: [id number] Arrival date & time: 03/07/21  4098     History Chief Complaint  Patient presents with  . Abdominal Pain    Sharon Kirk is a 60 y.o. female.  The history is provided by the patient.  Abdominal Pain Pain location:  Generalized Pain quality: aching   Pain radiates to:  Does not radiate Pain severity:  Mild Onset quality:  Gradual Timing:  Intermittent Progression:  Waxing and waning Chronicity:  New Context comment:  Melanoma hx, on immunotherapy that she started last week, diffuse body pain since but worst in abdomen Relieved by:  Nothing Worsened by:  Nothing Associated symptoms: constipation   Associated symptoms: no anorexia, no belching, no chest pain, no chills, no cough, no dysuria, no fever, no hematuria, no shortness of breath, no sore throat and no vomiting        Past Medical History:  Diagnosis Date  . Allergy   . Cancer ([place])    Melanoma  . Hyperlipidemia   . IBS (irritable bowel syndrome)   . Osteopenia   . PONV (postoperative nausea and vomiting)   . Thyroid nodule     Patient Active Problem List   Diagnosis Date Noted  . Goals of care, counseling/discussion 02/21/2021  . DVT (deep venous thrombosis) ([place]) 08/13/2020  . Pulmonary hypertension ([place]) 08/13/2020  . Pneumonia due to COVID-19 virus 08/10/2020  . S/P thoracentesis   . Pleural effusion, right   . Hyponatremia   . ARDS (adult respiratory distress syndrome) ([place])   . Community acquired pneumonia   . Multiple thyroid nodules 12/12/2017  . Melanoma of skin ([place]) 06/08/2016  . Hypothyroidism 08/14/2012    Past Surgical History:  Procedure Laterality Date  . ABDOMINAL HYSTERECTOMY  1991  . DILATION AND CURETTAGE OF UTERUS  1988  . IR US GUIDANCE  02/15/2021  . MELANOMA EXCISION Right 05/23/2016   Procedure: WIDE  EXCISION RIGHT SHOULDER MELANOMA AND WIDE EXCISION LEFT LOWER LEG MELANOMA;  Surgeon: Jackolyn Confer, MD;  Location: [facility];  Service: General;  Laterality: Right;  right shoulder and left lower leg sites  . SENTINEL NODE BIOPSY Left 05/23/2016   Procedure: LEFT INGUINAL SENTINEL LYMPH  NODE BIOPSY;  Surgeon: Jackolyn Confer, MD;  Location: [facility];  Service: General;  Laterality: Left;  . THYROID SURGERY  07/2015   NODULE     OB History    Gravida  0   Para  0   Term  0   Preterm  0   AB  0   Living  0     SAB  0   IAB  0   Ectopic  0   Multiple  0   Live Births  0           Family History  Problem Relation Age of Onset  . Diabetes Brother   . Hypertension Brother   . Cancer Maternal Grandmother        COLON  . Cancer Paternal Grandfather        PROSTATE  . Hypertension Mother   . Diabetes Brother   . Heart attack Father     Social History   Tobacco Use  . Smoking status: Never Smoker  . Smokeless tobacco: Never Used  Vaping Use  . Vaping Use: Never used  Substance Use Topics  . Alcohol use: No  . Drug use: No    Home Medications  Prior to Admission medications   Medication Sig Start Date End Date Taking? Authorizing Provider  albuterol (VENTOLIN HFA) 108 (90 Base) MCG/ACT inhaler Inhale 1 puff into the lungs every 4 (four) hours as needed for wheezing or shortness of breath. 08/24/20   Pokhrel, Corrie Mckusick, MD  apixaban (ELIQUIS) 5 MG TABS tablet Take 1 tablet (5 mg total) by mouth 2 (two) times daily. 08/24/20   Pokhrel, Corrie Mckusick, MD  calcium carbonate (OS-CAL) 600 MG TABS Take 600 mg by mouth 2 (two) times daily with a meal.    [provider]  cholecalciferol (VITAMIN D) 1000 UNITS tablet Take 5,000 Units by mouth daily.    [provider]  Cyanocobalamin (VITAMIN B-12 CR PO) Take 1 tablet by mouth daily.    [provider]  HYDROcodone-acetaminophen (NORCO) 5-325 MG tablet Take 1 tablet by mouth every 6 (six) hours as needed for moderate pain. 02/23/21   Wyatt Portela, MD   LORazepam (ATIVAN) 1 MG tablet Take one tablet one hour before MRI 02/21/21   Wyatt Portela, MD  prochlorperazine (COMPAZINE) 10 MG tablet Take 1 tablet (10 mg total) by mouth every 6 (six) hours as needed for nausea or vomiting. 02/21/21   Wyatt Portela, MD  traMADol (ULTRAM) 50 MG tablet Take 1 tablet (50 mg total) by mouth every 6 (six) hours as needed. 02/21/21   Wyatt Portela, MD  venlafaxine XR (EFFEXOR XR) 37.5 MG 24 hr capsule Take 1 capsule (37.5 mg total) by mouth daily with breakfast. 09/30/20   Tamela Gammon, NP    Allergies    Codeine  Review of Systems   Review of Systems  Constitutional: Negative for chills and fever.  HENT: Negative for ear pain and sore throat.   Eyes: Negative for pain and visual disturbance.  Respiratory: Negative for cough and shortness of breath.   Cardiovascular: Negative for chest pain and palpitations.  Gastrointestinal: Positive for abdominal pain and constipation. Negative for anorexia and vomiting.  Genitourinary: Negative for dysuria and hematuria.  Musculoskeletal: Negative for arthralgias and back pain.  Skin: Negative for color change and rash.  Neurological: Negative for seizures and syncope.  All other systems reviewed and are negative.   Physical Exam Updated Vital Signs  ED Triage Vitals  Enc Vitals Group     BP 03/07/21 0824 107/74     Pulse Rate 03/07/21 0824 78     Resp 03/07/21 0824 18     Temp 03/07/21 0824 98.4 F (36.9 C)     Temp Source 03/07/21 0824 Oral     SpO2 03/07/21 0824 96 %     Weight 03/07/21 0826 149 lb 9.6 oz (67.9 kg)     Height 03/07/21 0826 5' 5.5" (1.664 m)     Head Circumference --      Peak Flow --      Pain Score 03/07/21 0825 8     Pain Loc --      Pain Edu? --      Excl. in [place]? --     Physical Exam Vitals and nursing note reviewed.  Constitutional:      General: She is not in acute distress.    Appearance: She is well-developed.  HENT:     Head: Normocephalic and atraumatic.      Mouth/Throat:     Mouth: Mucous membranes are moist.  Eyes:     Extraocular Movements: Extraocular movements intact.     Conjunctiva/sclera: Conjunctivae normal.  Pupils: Pupils are equal, round, and reactive to light.  Cardiovascular:     Rate and Rhythm: Normal rate and regular rhythm.     Heart sounds: Normal heart sounds. No murmur heard.   Pulmonary:     Effort: Pulmonary effort is normal. No respiratory distress.     Breath sounds: Normal breath sounds.  Abdominal:     Palpations: Abdomen is soft.     Tenderness: There is generalized abdominal tenderness.  Musculoskeletal:     Cervical back: Neck supple.  Skin:    General: Skin is warm and dry.     Capillary Refill: Capillary refill takes less than 2 seconds.  Neurological:     General: No focal deficit present.     Mental Status: She is alert.     ED Results / Procedures / Treatments   Labs (all labs ordered are listed, but only abnormal results are displayed) Labs Reviewed  CBC WITH DIFFERENTIAL/PLATELET - Abnormal; Notable for the following components:      Result Value   Hemoglobin 10.8 (*)    HCT 33.2 (*)    All other components within normal limits  COMPREHENSIVE METABOLIC PANEL - Abnormal; Notable for the following components:   Sodium 132 (*)    Albumin 3.3 (*)    All other components within normal limits  URINALYSIS, ROUTINE W REFLEX MICROSCOPIC - Abnormal; Notable for the following components:   Hgb urine dipstick SMALL (*)    Bacteria, UA RARE (*)    All other components within normal limits  LIPASE, BLOOD    EKG None  Radiology CT ABDOMEN PELVIS WO CONTRAST  Result Date: 03/07/2021 CLINICAL DATA:  Abdominal pain for 6 weeks.  History of melanoma. EXAM: CT ABDOMEN AND PELVIS WITHOUT CONTRAST TECHNIQUE: Multidetector CT imaging of the abdomen and pelvis was performed following the standard protocol without IV contrast. COMPARISON:  PET 02/14/2021 and CT abdomen pelvis 02/01/2021. FINDINGS:  Lower chest: Patchy areas of mild bronchiectasis, ground-glass and slight architectural distortion in the lung bases. Heart size normal. No pericardial or pleural effusion. Distal esophagus is unremarkable. Hepatobiliary: Liver and gallbladder are unremarkable. No biliary ductal dilatation. Pancreas: Negative. Spleen: Subtle low-attenuation lesion in the inferior spleen measures 1.8 cm, similar. Adrenals/Urinary Tract: Adrenal glands and right kidney are unremarkable. New left perinephric edema. Ureters are decompressed. Bladder is grossly unremarkable. Stomach/Bowel: Stomach, small bowel, appendix and colon are unremarkable. Vascular/Lymphatic: Atherosclerotic calcification of the aorta. Extensive retroperitoneal adenopathy in the abdomen and pelvis. Index partially calcified conglomerate nodal mass in the left periaortic station has enlarged, now measuring 7.7 x 9.0 cm (2/31), compared to 7.3 x 7.4 cm on 02/01/2021. Adenopathy extends posterior to the abdominal aorta, displacing it anteriorly. Enlarging bilateral iliac chain adenopathy, left greater than right. Index left common iliac nodal mass measures 5.6 x 5.6 cm (2/62), compared to 2.8 x 3.8 cm on 02/01/2021. Reproductive: Hysterectomy.  No adnexal mass. Other: Presacral edema/fluid.  Fluid in the left paracolic gutter. Musculoskeletal: No worrisome lytic or sclerotic lesions. IMPRESSION: 1. Enlarging abdominal and retroperitoneal adenopathy, consistent with metastatic melanoma. 2. New left perinephric edema may indicate spread to the adjacent left renal capsule. Subtle low-attenuation lesion in the spleen, indeterminate. Continued attention on follow-up is recommended. 3. Sequelae of post COVID-19 pneumonia in the visualized lung bases. 4.  Aortic atherosclerosis (ICD10-I70.0). Electronically Signed   By: Leanna BattlesMelinda  Blietz M.D.   On: 03/07/2021 10:09    Procedures Procedures   Medications Ordered in ED Medications  fentaNYL (SUBLIMAZE) injection  50 mcg  (50 mcg Intravenous Given 03/07/21 0916)  sodium chloride 0.9 % bolus 500 mL (500 mLs Intravenous Bolus 03/07/21 0915)  ondansetron (ZOFRAN) injection 4 mg (4 mg Intravenous Given 03/07/21 8366)    ED Course  I have reviewed the triage vital signs and the nursing notes.  Pertinent labs & imaging results that were available during my care of the patient were reviewed by me and considered in my medical decision making (see chart for details).    MDM Rules/Calculators/A&P                          Sharon Kirk is a 60 year old female with history of melanoma, IBS who presents to the ED with abdominal pain, nausea.  Patient recently started immunotherapy again last week and has had some abdominal pain and body pain and nausea since.  Has had some constipation as well.  No fever, normal vitals.  Overall diffusely tender on abdominal exam.  We will get basic labs and CT scan abdomen pelvis.  Suspect possibly medication side effect but will evaluate for bowel obstruction, intra-abdominal infection, UTI, constipation.  Lab work shows no significant anemia, electrolyte abnormality, kidney injury.  Urinalysis negative for UTI.  CT scan shows slight progression in abdominal and retroperitoneal adenopathy consistent with her metastatic melanoma.  There is also some new edema in the left kidney area space.  May be in the spleen as well.  However there is no acute surgical process or other acute emergent process at this time.  She was made aware of these findings and recommend continued follow-up with oncology.  Discharged in good condition.  This chart was dictated using voice recognition software.  Despite best efforts to proofread,  errors can occur which can change the documentation meaning.    Final Clinical Impression(s) / ED Diagnoses Final diagnoses:  Generalized abdominal pain    Rx / DC Orders ED Discharge Orders    None       Lennice Sites, DO 03/07/21 1034

## 2021-03-07 NOTE — ED Triage Notes (Signed)
Arrived via POV with c/o abdominal pain x6 weeks.  Pt reports lymph nodes swollen in the abdominal, flank and neck area. Pt has hx of melanoma cancer and reported after immunotherapy drug, the swelling would decreased but has gotten worse since Sunday causing abdominal pain.

## 2021-03-07 NOTE — Telephone Encounter (Signed)
Patient husband called. Patient currently in ED with abdominal pain. They have done a scan (CT). Husband wanted Dr.Shadad to know and asked if Dr. Alen Blew will see her, or if there is anything he recommended.  Dr. Hazeline Junker response: Nothing to recommend now other than taking pain medication. Her pain is related to cancer and she is getting treated for it. Contacted patient spouse and informed of Dr. Hazeline Junker response. Husband verbalized understanding and states he will tell his wife. He stated his wife has not been taking pain medication much - she said it makes her feel bad.

## 2021-03-08 ENCOUNTER — Telehealth: Payer: Self-pay | Admitting: *Deleted

## 2021-03-10 ENCOUNTER — Telehealth: Payer: Self-pay | Admitting: *Deleted

## 2021-03-10 ENCOUNTER — Other Ambulatory Visit: Payer: Self-pay | Admitting: Oncology

## 2021-03-10 ENCOUNTER — Encounter: Payer: Self-pay | Admitting: *Deleted

## 2021-03-10 NOTE — Telephone Encounter (Signed)
Pt left a message requesting a letter stating she needs to be out of work beginning 03/21/21 for chemotherapy and pain mgmt.

## 2021-03-10 NOTE — Telephone Encounter (Signed)
Ok to provide letter

## 2021-03-10 NOTE — Telephone Encounter (Signed)
Letter completed for Sharon Kirk.

## 2021-03-11 ENCOUNTER — Telehealth: Payer: Self-pay | Admitting: *Deleted

## 2021-03-11 ENCOUNTER — Encounter: Payer: Self-pay | Admitting: Oncology

## 2021-03-11 NOTE — Telephone Encounter (Signed)
Attempting to send letter to [facility]

## 2021-03-15 ENCOUNTER — Other Ambulatory Visit: Payer: Self-pay | Admitting: *Deleted

## 2021-03-15 MED ORDER — HYDROCODONE-ACETAMINOPHEN 5-325 MG PO TABS: 1.0000 | ORAL_TABLET | Freq: Four times a day (QID) | ORAL | 0 refills | Status: DC | PRN

## 2021-03-23 ENCOUNTER — Inpatient Hospital Stay: Payer: BC Managed Care – PPO | Attending: Oncology

## 2021-03-23 ENCOUNTER — Other Ambulatory Visit: Payer: Self-pay

## 2021-03-23 ENCOUNTER — Inpatient Hospital Stay (HOSPITAL_BASED_OUTPATIENT_CLINIC_OR_DEPARTMENT_OTHER): Payer: BC Managed Care – PPO | Admitting: Oncology

## 2021-03-23 ENCOUNTER — Inpatient Hospital Stay: Payer: BC Managed Care – PPO

## 2021-03-23 ENCOUNTER — Ambulatory Visit (HOSPITAL_COMMUNITY)
Admission: RE | Admit: 2021-03-23 | Discharge: 2021-03-23 | Disposition: A | Discharge status: Home / Self Care | Payer: BC Managed Care – PPO | Source: Ambulatory Visit | Attending: Oncology | Admitting: Oncology

## 2021-03-23 VITALS — BP 107/64 | HR 91 | Temp 97.3°F | Resp 18 | Wt 145.1 lb

## 2021-03-23 DIAGNOSIS — C774 Secondary and unspecified malignant neoplasm of inguinal and lower limb lymph nodes: Secondary | ICD-10-CM | POA: Diagnosis not present

## 2021-03-23 DIAGNOSIS — M79662 Pain in left lower leg: Secondary | ICD-10-CM

## 2021-03-23 DIAGNOSIS — K5900 Constipation, unspecified: Secondary | ICD-10-CM | POA: Diagnosis not present

## 2021-03-23 DIAGNOSIS — C439 Malignant melanoma of skin, unspecified: Secondary | ICD-10-CM | POA: Diagnosis not present

## 2021-03-23 DIAGNOSIS — M7989 Other specified soft tissue disorders: Secondary | ICD-10-CM | POA: Diagnosis not present

## 2021-03-23 DIAGNOSIS — C4372 Malignant melanoma of left lower limb, including hip: Secondary | ICD-10-CM | POA: Insufficient documentation

## 2021-03-23 DIAGNOSIS — C4370 Malignant melanoma of unspecified lower limb, including hip: Secondary | ICD-10-CM | POA: Diagnosis not present

## 2021-03-23 DIAGNOSIS — Z79899 Other long term (current) drug therapy: Secondary | ICD-10-CM | POA: Diagnosis not present

## 2021-03-23 DIAGNOSIS — Z5112 Encounter for antineoplastic immunotherapy: Secondary | ICD-10-CM | POA: Insufficient documentation

## 2021-03-23 DIAGNOSIS — C4361 Malignant melanoma of right upper limb, including shoulder: Secondary | ICD-10-CM | POA: Diagnosis not present

## 2021-03-23 LAB — CMP (CANCER CENTER ONLY)
ALT: 16 U/L (ref 0–44)
AST: 21 U/L (ref 15–41)
Albumin: 3.1 g/dL — ABNORMAL LOW (ref 3.5–5.0)
Alkaline Phosphatase: 73 U/L (ref 38–126)
Anion gap: 13 (ref 5–15)
BUN: 10 mg/dL (ref 8–23)
CO2: 27 mmol/L (ref 22–32)
Calcium: 9.7 mg/dL (ref 8.9–10.3)
Chloride: 97 mmol/L — ABNORMAL LOW (ref 98–111)
Creatinine: 0.6 mg/dL (ref 0.44–1.00)
GFR, Estimated: >60 mL/min (ref >60)
Glucose, Bld: 87 mg/dL (ref 70–99)
Potassium: 4.1 mmol/L (ref 3.5–5.1)
Sodium: 137 mmol/L (ref 135–145)
Total Bilirubin: 0.2 mg/dL — ABNORMAL LOW (ref 0.3–1.2)
Total Protein: 8 g/dL (ref 6.5–8.1)

## 2021-03-23 LAB — CBC WITH DIFFERENTIAL (CANCER CENTER ONLY)
Abs Immature Granulocytes: 0.02 10*3/uL (ref 0.00–0.07)
Basophils Absolute: 0.1 10*3/uL (ref 0.0–0.1)
Basophils Relative: 1 %
Eosinophils Absolute: 0.2 10*3/uL (ref 0.0–0.5)
Eosinophils Relative: 3 %
HCT: 34.1 % — ABNORMAL LOW (ref 36.0–46.0)
Hemoglobin: 10.9 g/dL — ABNORMAL LOW (ref 12.0–15.0)
Immature Granulocytes: 0 %
Lymphocytes Relative: 27 %
Lymphs Abs: 1.8 10*3/uL (ref 0.7–4.0)
MCH: 25.8 pg — ABNORMAL LOW (ref 26.0–34.0)
MCHC: 32 g/dL (ref 30.0–36.0)
MCV: 80.8 fL (ref 80.0–100.0)
Monocytes Absolute: 0.6 10*3/uL (ref 0.1–1.0)
Monocytes Relative: 9 %
Neutro Abs: 3.9 10*3/uL (ref 1.7–7.7)
Neutrophils Relative %: 60 %
Platelet Count: 439 10*3/uL — ABNORMAL HIGH (ref 150–400)
RBC: 4.22 MIL/uL (ref 3.87–5.11)
RDW: 15.3 % (ref 11.5–15.5)
WBC Count: 6.5 10*3/uL (ref 4.0–10.5)
nRBC: 0 % (ref 0.0–0.2)

## 2021-03-23 LAB — TSH: TSH: 0.784 u[IU]/mL (ref 0.308–3.960)

## 2021-03-23 MED ORDER — FAMOTIDINE 20 MG IN NS 100 ML IVPB
INTRAVENOUS | Status: AC
  Filled 2021-03-23: qty 100

## 2021-03-23 MED ORDER — ACETAMINOPHEN 325 MG PO TABS
650.0000 mg | ORAL_TABLET | Freq: Once | ORAL | Status: AC
  Administered 2021-03-23: 650 mg via ORAL

## 2021-03-23 MED ORDER — DIPHENHYDRAMINE HCL 50 MG/ML IJ SOLN
INTRAMUSCULAR | Status: AC
  Filled 2021-03-23: qty 1

## 2021-03-23 MED ORDER — ACETAMINOPHEN 325 MG PO TABS
ORAL_TABLET | ORAL | Status: AC
  Filled 2021-03-23: qty 2

## 2021-03-23 MED ORDER — FAMOTIDINE 20 MG IN NS 100 ML IVPB
20.0000 mg | Freq: Once | INTRAVENOUS | Status: AC
  Administered 2021-03-23: 20 mg via INTRAVENOUS

## 2021-03-23 MED ORDER — DIPHENHYDRAMINE HCL 50 MG/ML IJ SOLN
25.0000 mg | Freq: Once | INTRAMUSCULAR | Status: AC
  Administered 2021-03-23: 25 mg via INTRAVENOUS

## 2021-03-23 MED ORDER — SODIUM CHLORIDE 0.9 % IV SOLN
Freq: Once | INTRAVENOUS | Status: AC
  Filled 2021-03-23: qty 250

## 2021-03-23 MED ORDER — SODIUM CHLORIDE 0.9 % IV SOLN
3.0000 mg/kg | Freq: Once | INTRAVENOUS | Status: AC
  Administered 2021-03-23: 200 mg via INTRAVENOUS
  Filled 2021-03-23: qty 40

## 2021-03-23 MED ORDER — SODIUM CHLORIDE 0.9 % IV SOLN
1.0000 mg/kg | Freq: Once | INTRAVENOUS | Status: AC
  Administered 2021-03-23: 68 mg via INTRAVENOUS
  Filled 2021-03-23 (×3): qty 6.8

## 2021-03-23 NOTE — Progress Notes (Signed)
Ipilimumab (YERVOY) Patient Monitoring Assessment   Is the patient experiencing any of the following general symptoms?:  [] Difficulty performing normal activities [x] Feeling sluggish or cold all the time [] Unusual weight gain [x] Constant or unusual headaches [] Feeling dizzy or faint [] Changes in eyesight (blurry vision, double vision, or other vision problems) [] Changes in mood or behavior (ex: decreased sex drive, irritability, or forgetfulness) [] Starting new medications (ex: steroids, other medications that lower immune response) [] Patient is not experiencing any of the general symptoms above.    Gastrointestinal  Patient is having 1 bowel movement every 3rd day Is this different from baseline? [x] Yes [] No Are your stools watery or do they have a foul smell? [] Yes [x] No Have you seen blood in your stools? [] Yes [x] No Are your stools dark, tarry, or sticky? [] Yes [x] No Are you having pain or tenderness in your belly? [] Yes [x] No  Skin Does your skin itch? [x] Yes [] No Do you have a rash? [] Yes [x] No Has your skin blistered and/or peeled? [] Yes [x] No Do you have sores in your mouth? [] Yes [x] No  Hepatic Has your urine been dark or tea colored? [] Yes [x] No Have you noticed that your skin or the whites of your eyes are turning yellow? [] Yes [x] No Are you bleeding or bruising more easily than normal? [] Yes [x] No Are you nauseous and/or vomiting? [x] Yes [] No Do you have pain on the right side of your stomach? [] Yes [x] No  Neurologic  Are you having unusual weakness of legs, arms, or face? [] Yes [x] No Are you having numbness or tingling in your hands or feet? [] Yes [x] No  Lafe Clerk E Vyctoria Dickman  Dr. Alen Blew notified of worksheet results, per his order OK to treat today.

## 2021-03-23 NOTE — Progress Notes (Signed)
Hematology and Oncology Follow Up  Sharon Kirk [id number] 1961-03-25 60 y.o. 03/23/2021 10:50 AM Sharon Kirk, Sharon Brow, MD       Principle Diagnosis: 60 year old woman with melanoma of the lower extremity and shoulder diagnosed in 2017.  She developed stage IV with lymphadenopathy diagnosed in April 2022.  Her tumor is BRAF wild-type.  Secondary diagnoses:    1. Superficial spreading melanoma of the lower extremity diagnosed in August 2017. She was found to have 1.25 mm thickness without ulceration or lymphovascular invasion. She had 1 out of 2 lymph nodes positive. The size of the lymph node measuring 0.25 x 0.95 mm with pathological staging T2bN1.   2. 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 These findings indicate a pathological staging of T1 NA.   Prior Therapy: She is status post local excision followed by sentinel lymph node sampling with 2 lymph nodes sampled one was involved with metastatic melanoma. The measurement was 0.25 x 0.95 mm in dimension of the capsule. Her final pathological stage stage is T2bN1. This was completed on 05/23/2016.   Nivolumab total of 240 mg dose every 2 weeks for total of 4 doses. Started on 07/21/2016.  She completed 4 cycles of therapy for a total of 2 months out of planned 12.  Patient opted out continuing immunotherapy.  She is status post lymph node biopsy completed on Feb 15, 2021.    Current therapy:   Ipilimumab 3 mg/kg with a nivolumab at 1 mg/kg started on Mar 04, 2021.  She is here for cycle 2 of therapy.   Interim History: Sharon Kirk presents today for a follow-up visit.  Since her last visit, she received the first cycle of ipilimumab and nivolumab without any complications.  She denies any nausea, fatigue or excessive tiredness.  She did develop left lower extremity edema however.  Her pain is overall manageable  taking hydrocodone at nighttime and Tylenol during the day.  She denies any respiratory complaints or recent hospitalizations.    Medications: Unchanged on review. Current Outpatient Medications  Medication Sig Dispense Refill  . albuterol (VENTOLIN HFA) 108 (90 Base) MCG/ACT inhaler Inhale 1 puff into the lungs every 4 (four) hours as needed for wheezing or shortness of breath. 1 each 2  . apixaban (ELIQUIS) 5 MG TABS tablet Take 1 tablet (5 mg total) by mouth 2 (two) times daily. 60 tablet 2  . calcium carbonate (OS-CAL) 600 MG TABS Take 600 mg by mouth 2 (two) times daily with a meal.    . cholecalciferol (VITAMIN D) 1000 UNITS tablet Take 5,000 Units by mouth daily.    . Cyanocobalamin (VITAMIN B-12 CR PO) Take 1 tablet by mouth daily.    Marland Kitchen HYDROcodone-acetaminophen (NORCO) 5-325 MG tablet Take 1 tablet by mouth every 6 (six) hours as needed for moderate pain. 30 tablet 0  . LORazepam (ATIVAN) 1 MG tablet Take one tablet one hour before MRI 30 tablet 0  . prochlorperazine (COMPAZINE) 10 MG tablet Take 1 tablet (10 mg total) by mouth every 6 (six) hours as needed for nausea or vomiting. 30 tablet 0  . traMADol (ULTRAM) 50 MG tablet Take 1 tablet (50 mg total) by mouth every 6 (six) hours as needed. 60 tablet 0  . venlafaxine XR (EFFEXOR XR) 37.5 MG 24 hr capsule Take 1 capsule (37.5 mg total) by mouth daily with breakfast. 90 capsule 0  No current facility-administered medications for this visit.     Allergies:  Allergies  Allergen Reactions  . Codeine Nausea Only and Other (See Comments)    Hallucinations; confusion   Physical exam Blood pressure 107/64, pulse 91, temperature (!) 97.3 F (36.3 C), resp. rate 18, weight 145 lb 1.6 oz (65.8 kg), last menstrual period 06/30/1990, SpO2 100 %.   ECOG 0  General appearance: Alert, awake without any distress. Head: Atraumatic without abnormalities Oropharynx: Without any thrush or ulcers. Eyes: No scleral icterus. Lymph nodes: No  lymphadenopathy noted in the cervical, supraclavicular, or axillary nodes Heart:regular rate and rhythm, without any murmurs or gallops.    Left leg and thigh edema noted. Lung: Clear to auscultation without any rhonchi, wheezes or dullness to percussion. Abdomin: Soft, nontender without any shifting dullness or ascites. Musculoskeletal: No clubbing or cyanosis. Neurological: No motor or sensory deficits. Skin: No rashes or lesions. .     Lab Results: Lab Results  Component Value Date   WBC 7.6 03/07/2021   HGB 10.8 (L) 03/07/2021   HCT 33.2 (L) 03/07/2021   MCV 80.4 03/07/2021   PLT 322 03/07/2021     Chemistry      Component Value Date/Time   NA 132 (L) 03/07/2021 0900   NA 140 09/08/2016 0755   K 4.4 03/07/2021 0900   K 3.9 09/08/2016 0755   CL 98 03/07/2021 0900   CO2 26 03/07/2021 0900   CO2 23 09/08/2016 0755   BUN 9 03/07/2021 0900   BUN 11.2 09/08/2016 0755   CREATININE 0.48 03/07/2021 0900   CREATININE 0.62 03/04/2021 1226   CREATININE 0.61 09/30/2019 0839   CREATININE 0.7 09/08/2016 0755      Component Value Date/Time   CALCIUM 9.2 03/07/2021 0900   CALCIUM 9.1 09/08/2016 0755   ALKPHOS 60 03/07/2021 0900   ALKPHOS 61 09/08/2016 0755   AST 24 03/07/2021 0900   AST 24 03/04/2021 1226   AST 13 09/08/2016 0755   ALT 26 03/07/2021 0900   ALT 26 03/04/2021 1226   ALT 16 09/08/2016 0755   BILITOT 0.4 03/07/2021 0900   BILITOT 0.2 (L) 03/04/2021 1226   BILITOT 0.42 09/08/2016 0755        Impression and Plan:   60 year old woman with:  1.  Stage IV melanoma with lymphadenopathy in April 2022 after presenting with localized disease in 2017.    Her disease status was updated at this time and treatment options were reviewed.  Given her wild-type BRAF tumor, the plan is to continue with combination immunotherapy at this time.  Complications associated with this treatment include nausea, fatigue as well as immune mediated issues.  The plan is to update  her imaging studies after cycle 4.  She is agreeable to proceed at this time.  2.  IV access: Port-A-Cath option has been deferred at this time.  Peripheral veins are currently in use.  3.  Antiemetics: No nausea or vomiting reported at this time Compazine is available to her.   4.  Autoimmune complications: I continue to educate her about potential complication occluding pneumonitis, colitis and thyroid disease.   5.  CNS staging: MRI of the brain on Mar 01, 2021 showed no evidence of disease.     6.  Back pain and abdominal pain: Related to lymphadenopathy and manageable at this time.  7.  Lower extremity swelling: We will evaluate with ultrasound Doppler to rule out DVT.  8.  Treatment goal and prognosis: Her disease remains  incurable although aggressive measures are warranted.  9.  Follow-up: In 3 weeks for the next cycle of therapy.   30  minutes were spent on this encounter.  Time was dedicated to reviewing laboratory data, disease status update, addressing complications related to her cancer and cancer therapy.   Zola Button, MD 03/23/2021 10:50 AM

## 2021-03-23 NOTE — Progress Notes (Signed)
Lower extremity venous has been completed.   Preliminary results in CV Proc.   Abram Sander 03/23/2021 12:49 PM

## 2021-03-23 NOTE — Patient Instructions (Signed)
[facility]  Discharge Instructions: °Thank you for choosing [facility] to provide your oncology and hematology care.  ° °If you have a lab appointment with the Cancer Center, please go directly to the Cancer Center and check in at the registration area. °  °Wear comfortable clothing and clothing appropriate for easy access to any Portacath or PICC line.  ° °We strive to give you quality time with your provider. You may need to reschedule your appointment if you arrive late (15 or more minutes).  Arriving late affects you and other patients whose appointments are after yours.  Also, if you miss three or more appointments without notifying the office, you may be dismissed from the clinic at the provider’s discretion.    °  °For prescription refill requests, have your pharmacy contact our office and allow 72 hours for refills to be completed.   ° °Today you received the following chemotherapy and/or immunotherapy agents opdivo/yervoy    °  °To help prevent nausea and vomiting after your treatment, we encourage you to take your nausea medication as directed. ° °BELOW ARE SYMPTOMS THAT SHOULD BE REPORTED IMMEDIATELY: °*FEVER GREATER THAN 100.4 F (38 °C) OR HIGHER °*CHILLS OR SWEATING °*NAUSEA AND VOMITING THAT IS NOT CONTROLLED WITH YOUR NAUSEA MEDICATION °*UNUSUAL SHORTNESS OF BREATH °*UNUSUAL BRUISING OR BLEEDING °*URINARY PROBLEMS (pain or burning when urinating, or frequent urination) °*BOWEL PROBLEMS (unusual diarrhea, constipation, pain near the anus) °TENDERNESS IN MOUTH AND THROAT WITH OR WITHOUT PRESENCE OF ULCERS (sore throat, sores in mouth, or a toothache) °UNUSUAL RASH, SWELLING OR PAIN  °UNUSUAL VAGINAL DISCHARGE OR ITCHING  ° °Items with * indicate a potential emergency and should be followed up as soon as possible or go to the Emergency Department if any problems should occur. ° °Please show the CHEMOTHERAPY ALERT CARD or IMMUNOTHERAPY ALERT CARD at check-in  to the Emergency Department and triage nurse. ° °Should you have questions after your visit or need to cancel or reschedule your appointment, please contact [facility]  Dept: [phone]  and follow the prompts.  Office hours are 8:00 a.m. to 4:30 p.m. Monday - Friday. Please note that voicemails left after 4:00 p.m. may not be returned until the following business day.  We are closed weekends and major holidays. You have access to a nurse at all times for urgent questions. Please call the main number to the clinic Dept: [phone] and follow the prompts. ° ° °For any non-urgent questions, you may also contact your provider using MyChart. We now offer e-Visits for anyone 18 and older to request care online for non-urgent symptoms. For details visit [email or web address]. °  °Also download the MyChart app! Go to the app store, search "MyChart", open the app, select [place], and log in with your MyChart username and password. ° °Due to Covid, a mask is required upon entering the hospital/clinic. If you do not have a mask, one will be given to you upon arrival. For doctor visits, patients may have 1 support person aged 18 or older with them. For treatment visits, patients cannot have anyone with them due to current Covid guidelines and our immunocompromised population.  ° °

## 2021-03-24 ENCOUNTER — Telehealth: Payer: Self-pay | Admitting: *Deleted

## 2021-03-24 NOTE — Telephone Encounter (Signed)
Added page received today to short term disability form.   [employer] header reads "ReportingYour Disability Claim/Leave" includes authorization to release information of any healthcare provider to [employer].  Lenard Simmer employee signature dated 03-21-2021.

## 2021-03-30 ENCOUNTER — Encounter: Payer: Self-pay | Admitting: Oncology

## 2021-04-08 ENCOUNTER — Telehealth: Payer: Self-pay

## 2021-04-08 NOTE — Telephone Encounter (Signed)
Connected with Patient regarding disability concerns. Offered reassurance and conveyed that form was forwarded to HIM for requested records to be sent to insurance company

## 2021-04-13 ENCOUNTER — Inpatient Hospital Stay: Payer: BC Managed Care – PPO

## 2021-04-13 ENCOUNTER — Inpatient Hospital Stay (HOSPITAL_BASED_OUTPATIENT_CLINIC_OR_DEPARTMENT_OTHER): Payer: BC Managed Care – PPO | Admitting: Oncology

## 2021-04-13 ENCOUNTER — Other Ambulatory Visit: Payer: Self-pay

## 2021-04-13 VITALS — BP 99/72 | HR 81 | Temp 98.9°F | Resp 18 | Ht 65.5 in | Wt 139.9 lb

## 2021-04-13 DIAGNOSIS — R591 Generalized enlarged lymph nodes: Secondary | ICD-10-CM

## 2021-04-13 DIAGNOSIS — Z79899 Other long term (current) drug therapy: Secondary | ICD-10-CM | POA: Diagnosis not present

## 2021-04-13 DIAGNOSIS — C774 Secondary and unspecified malignant neoplasm of inguinal and lower limb lymph nodes: Secondary | ICD-10-CM | POA: Diagnosis not present

## 2021-04-13 DIAGNOSIS — C4361 Malignant melanoma of right upper limb, including shoulder: Secondary | ICD-10-CM | POA: Diagnosis not present

## 2021-04-13 DIAGNOSIS — C4370 Malignant melanoma of unspecified lower limb, including hip: Secondary | ICD-10-CM | POA: Diagnosis not present

## 2021-04-13 DIAGNOSIS — K5900 Constipation, unspecified: Secondary | ICD-10-CM | POA: Diagnosis not present

## 2021-04-13 DIAGNOSIS — C4372 Malignant melanoma of left lower limb, including hip: Secondary | ICD-10-CM | POA: Diagnosis not present

## 2021-04-13 DIAGNOSIS — M7989 Other specified soft tissue disorders: Secondary | ICD-10-CM | POA: Diagnosis not present

## 2021-04-13 DIAGNOSIS — Z5112 Encounter for antineoplastic immunotherapy: Secondary | ICD-10-CM | POA: Diagnosis not present

## 2021-04-13 DIAGNOSIS — C439 Malignant melanoma of skin, unspecified: Secondary | ICD-10-CM

## 2021-04-13 LAB — CBC WITH DIFFERENTIAL (CANCER CENTER ONLY)
Abs Immature Granulocytes: 0.01 10*3/uL (ref 0.00–0.07)
Basophils Absolute: 0.1 10*3/uL (ref 0.0–0.1)
Basophils Relative: 1 %
Eosinophils Absolute: 0.3 10*3/uL (ref 0.0–0.5)
Eosinophils Relative: 5 %
HCT: 38.8 % (ref 36.0–46.0)
Hemoglobin: 12.3 g/dL (ref 12.0–15.0)
Immature Granulocytes: 0 %
Lymphocytes Relative: 32 %
Lymphs Abs: 1.9 10*3/uL (ref 0.7–4.0)
MCH: 25.8 pg — ABNORMAL LOW (ref 26.0–34.0)
MCHC: 31.7 g/dL (ref 30.0–36.0)
MCV: 81.3 fL (ref 80.0–100.0)
Monocytes Absolute: 0.5 10*3/uL (ref 0.1–1.0)
Monocytes Relative: 8 %
Neutro Abs: 3.2 10*3/uL (ref 1.7–7.7)
Neutrophils Relative %: 54 %
Platelet Count: 317 10*3/uL (ref 150–400)
RBC: 4.77 MIL/uL (ref 3.87–5.11)
RDW: 16.4 % — ABNORMAL HIGH (ref 11.5–15.5)
WBC Count: 5.9 10*3/uL (ref 4.0–10.5)
nRBC: 0 % (ref 0.0–0.2)

## 2021-04-13 LAB — TSH: TSH: 0.545 u[IU]/mL (ref 0.308–3.960)

## 2021-04-13 LAB — CMP (CANCER CENTER ONLY)
ALT: 15 U/L (ref 0–44)
AST: 13 U/L — ABNORMAL LOW (ref 15–41)
Albumin: 3.6 g/dL (ref 3.5–5.0)
Alkaline Phosphatase: 77 U/L (ref 38–126)
Anion gap: 9 (ref 5–15)
BUN: 7 mg/dL — ABNORMAL LOW (ref 8–23)
CO2: 26 mmol/L (ref 22–32)
Calcium: 10 mg/dL (ref 8.9–10.3)
Chloride: 104 mmol/L (ref 98–111)
Creatinine: 0.62 mg/dL (ref 0.44–1.00)
GFR, Estimated: >60 mL/min (ref >60)
Glucose, Bld: 91 mg/dL (ref 70–99)
Potassium: 4.6 mmol/L (ref 3.5–5.1)
Sodium: 139 mmol/L (ref 135–145)
Total Bilirubin: 0.3 mg/dL (ref 0.3–1.2)
Total Protein: 8.2 g/dL — ABNORMAL HIGH (ref 6.5–8.1)

## 2021-04-13 MED ORDER — SODIUM CHLORIDE 0.9 % IV SOLN
3.0000 mg/kg | Freq: Once | INTRAVENOUS | Status: AC
  Administered 2021-04-13: 200 mg via INTRAVENOUS
  Filled 2021-04-13: qty 40

## 2021-04-13 MED ORDER — FAMOTIDINE 20 MG IN NS 100 ML IVPB
INTRAVENOUS | Status: AC
  Filled 2021-04-13: qty 100

## 2021-04-13 MED ORDER — SODIUM CHLORIDE 0.9 % IV SOLN
Freq: Once | INTRAVENOUS | Status: AC
Start: 2021-04-13 — End: 2021-04-13
  Filled 2021-04-13: qty 250

## 2021-04-13 MED ORDER — FAMOTIDINE 20 MG IN NS 100 ML IVPB
20.0000 mg | Freq: Once | INTRAVENOUS | Status: AC
  Administered 2021-04-13: 20 mg via INTRAVENOUS

## 2021-04-13 MED ORDER — DIPHENHYDRAMINE HCL 50 MG/ML IJ SOLN
25.0000 mg | Freq: Once | INTRAMUSCULAR | Status: AC
  Administered 2021-04-13: 25 mg via INTRAVENOUS

## 2021-04-13 MED ORDER — SODIUM CHLORIDE 0.9 % IV SOLN
1.0000 mg/kg | Freq: Once | INTRAVENOUS | Status: AC
  Administered 2021-04-13: 68 mg via INTRAVENOUS
  Filled 2021-04-13: qty 6.8

## 2021-04-13 MED ORDER — DIPHENHYDRAMINE HCL 50 MG/ML IJ SOLN
INTRAMUSCULAR | Status: AC
  Filled 2021-04-13: qty 1

## 2021-04-13 NOTE — Progress Notes (Signed)
Ipilimumab (YERVOY) Patient Monitoring Assessment   Dr. Alen Blew aware of today's assessment (04/13/2021). OK to treat   Is the patient experiencing any of the following general symptoms?:  [] Difficulty performing normal activities [x] Feeling sluggish or cold all the time [] Unusual weight gain [] Constant or unusual headaches [] Feeling dizzy or faint [] Changes in eyesight (blurry vision, double vision, or other vision problems) [] Changes in mood or behavior (ex: decreased sex drive, irritability, or forgetfulness) [] Starting new medications (ex: steroids, other medications that lower immune response) [] Patient is not experiencing any of the general symptoms above.    Gastrointestinal  Patient is having 1 bowel movement Q3 days Is this different from baseline? [x] Yes [] No Are your stools watery or do they have a foul smell? [] Yes [] No Have you seen blood in your stools? [] Yes [] No Are your stools dark, tarry, or sticky? [] Yes [] No Are you having pain or tenderness in your belly? [] Yes [] No  Skin Does your skin itch? [x] Yes [] No Do you have a rash? [] Yes [] No Has your skin blistered and/or peeled? [] Yes [] No Do you have sores in your mouth? [] Yes [] No  Hepatic Has your urine been dark or tea colored? [] Yes [] No Have you noticed that your skin or the whites of your eyes are turning yellow? [] Yes [] No Are you bleeding or bruising more easily than normal? [] Yes [] No Are you nauseous and/or vomiting? [] Yes [] No Do you have pain on the right side of your stomach? [] Yes [] No  Neurologic  Are you having unusual weakness of legs, arms, or face? [x] Yes [] No Are you having numbness or tingling in your hands or feet? [] Yes [] No  Betsi Crespi E Meilani Edmundson

## 2021-04-13 NOTE — Progress Notes (Signed)
Hematology and Oncology Follow Up  Sharon Kirk [id number] 10/23/1960 60 y.o. 04/13/2021 11:38 AM Sharon Kirk, Sharon Brow, MD       Principle Diagnosis: 60 year old woman with stage IV melanoma with abdominal adenopathy diagnosed in a follow-up with 2022.  She was found to have localized disease in 2017 with current tumor is BRAF wild-type.  Secondary diagnoses:    1. Superficial spreading melanoma of the lower extremity diagnosed in August 2017. She was found to have 1.25 mm thickness without ulceration or lymphovascular invasion. She had 1 out of 2 lymph nodes positive. The size of the lymph node measuring 0.25 x 0.95 mm with pathological staging T2bN1.    2. 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 These findings indicate a pathological staging of T1 NA.     Prior Therapy: She is status post local excision followed by sentinel lymph node sampling with 2 lymph nodes sampled one was involved with metastatic melanoma. The measurement was 0.25 x 0.95 mm in dimension of the capsule. Her final pathological stage stage is T2bN1. This was completed on 05/23/2016.    Nivolumab total of 240 mg dose every 2 weeks for total of 4 doses. Started on 07/21/2016.  She completed 4 cycles of therapy for a total of 2 months out of planned 12.  Patient opted out continuing immunotherapy.  She is status post lymph node biopsy completed on Feb 15, 2021.     Current therapy:   Ipilimumab 3 mg/kg with a nivolumab at 1 mg/kg started on Mar 04, 2021.  She is here for cycle 3 of therapy.   Interim History: Sharon Kirk returns today for a follow-up evaluation.  Since her last visit, she continues to tolerate therapy without any major complaints.  She denies any nausea, vomiting or abdominal pain.  She denies any worsening diarrhea or skin rash.  She denies any dyspnea on exertion.  She does report  occasional constipation but manageable overall.  Her left lower extremity edema has improved currently.    Medications: Unchanged on review. Current Outpatient Medications  Medication Sig Dispense Refill   albuterol (VENTOLIN HFA) 108 (90 Base) MCG/ACT inhaler Inhale 1 puff into the lungs every 4 (four) hours as needed for wheezing or shortness of breath. 1 each 2   apixaban (ELIQUIS) 5 MG TABS tablet Take 1 tablet (5 mg total) by mouth 2 (two) times daily. 60 tablet 2   calcium carbonate (OS-CAL) 600 MG TABS Take 600 mg by mouth 2 (two) times daily with a meal.     cholecalciferol (VITAMIN D) 1000 UNITS tablet Take 5,000 Units by mouth daily.     Cyanocobalamin (VITAMIN B-12 CR PO) Take 1 tablet by mouth daily.     HYDROcodone-acetaminophen (NORCO) 5-325 MG tablet Take 1 tablet by mouth every 6 (six) hours as needed for moderate pain. 30 tablet 0   LORazepam (ATIVAN) 1 MG tablet Take one tablet one hour before MRI 30 tablet 0   prochlorperazine (COMPAZINE) 10 MG tablet Take 1 tablet (10 mg total) by mouth every 6 (six) hours as needed for nausea or vomiting. 30 tablet 0   venlafaxine XR (EFFEXOR XR) 37.5 MG 24 hr capsule Take 1 capsule (37.5 mg total) by mouth daily with breakfast. 90 capsule 0   No current facility-administered medications for this visit.     Allergies:  Allergies  Allergen Reactions   Codeine  Nausea Only and Other (See Comments)    Hallucinations; confusion   Physical exam Blood pressure 99/72, pulse 81, temperature 98.9 F (37.2 C), temperature source Tympanic, resp. rate 18, height 5' 5.5" (1.664 m), weight 139 lb 14.4 oz (63.5 kg), last menstrual period 06/30/1990, SpO2 99 %.   ECOG 0   General appearance: Comfortable appearing without any discomfort Head: Normocephalic without any trauma Oropharynx: Mucous membranes are moist and pink without any thrush or ulcers. Eyes: Pupils are equal and round reactive to light. Lymph nodes: No cervical,  supraclavicular, inguinal or axillary lymphadenopathy.   Heart:regular rate and rhythm.  S1 and S2 without leg edema. Lung: Clear without any rhonchi or wheezes.  No dullness to percussion. Abdomin: Soft, nontender, nondistended with good bowel sounds.  No hepatosplenomegaly. Musculoskeletal: No joint deformity or effusion.  Full range of motion noted. Neurological: No deficits noted on motor, sensory and deep tendon reflex exam. Skin: No petechial rash or dryness.  Appeared moist.    .     Lab Results: Lab Results  Component Value Date   WBC 5.9 04/13/2021   HGB 12.3 04/13/2021   HCT 38.8 04/13/2021   MCV 81.3 04/13/2021   PLT 317 04/13/2021     Chemistry      Component Value Date/Time   NA 137 03/23/2021 1044   NA 140 09/08/2016 0755   K 4.1 03/23/2021 1044   K 3.9 09/08/2016 0755   CL 97 (L) 03/23/2021 1044   CO2 27 03/23/2021 1044   CO2 23 09/08/2016 0755   BUN 10 03/23/2021 1044   BUN 11.2 09/08/2016 0755   CREATININE 0.60 03/23/2021 1044   CREATININE 0.61 09/30/2019 0839   CREATININE 0.7 09/08/2016 0755      Component Value Date/Time   CALCIUM 9.7 03/23/2021 1044   CALCIUM 9.1 09/08/2016 0755   ALKPHOS 73 03/23/2021 1044   ALKPHOS 61 09/08/2016 0755   AST 21 03/23/2021 1044   AST 13 09/08/2016 0755   ALT 16 03/23/2021 1044   ALT 16 09/08/2016 0755   BILITOT 0.2 (L) 03/23/2021 1044   BILITOT 0.42 09/08/2016 0755        Impression and Plan:   60 year old woman with:  1.  BRAF wild-type, stage IV melanoma with lymphadenopathy diagnosed in April 2022.  She had a lower extremity melanoma in 2017.  The natural course of her disease was reviewed at this time and treatment choices were discussed.  She is experiencing clinical benefit to current treatment after 2 cycles of ipilimumab and nivolumab.  Risks and benefits of proceeding with cycle 3 were discussed.  Immune mediated complications remained the major concern which she has not experienced any at this  time.  Plan is to update her staging scans after the next cycle of therapy.  2.  IV access: Peripheral veins are currently in use without any issues.  3.  Antiemetics: Compazine is available to her without any nausea or vomiting.   4.  Autoimmune complications: No reported autoimmune issues at this time.  I continue to educate her about pneumonitis, colitis, hepatitis among others associated with this treatment.   5.  CNS staging: She has no evidence of metastatic disease to the brain.  Last imaging done in May 2022.   6.  Back pain and abdominal pain: Continues to improve at this time as an indication of possible clinical response.  7.  Lower extremity swelling: Related to her lymphadenopathy continues to improve.  8.  Treatment goal and prognosis:  therapy remains palliative although aggressive measures are warranted given her excellent performance status.  9.  Follow-up: She will return in 3 weeks for the next cycle of therapy.     30  minutes were dedicated to this visit.  Time was spent on reviewing laboratory testing, disease status update, treatment choices and outlining future plan of care.   Zola Button, MD 04/13/2021 11:38 AM

## 2021-04-13 NOTE — Patient Instructions (Signed)
[facility]  Discharge Instructions: Thank you for choosing [facility] to provide your oncology and hematology care.   If you have a lab appointment with the [facility], please go directly to the [facility] and check in at the registration area.   Wear comfortable clothing and clothing appropriate for easy access to any Portacath or PICC line.   We strive to give you quality time with your provider. You may need to reschedule your appointment if you arrive late (15 or more minutes).  Arriving late affects you and other patients whose appointments are after yours.  Also, if you miss three or more appointments without notifying the office, you may be dismissed from the clinic at the provider's discretion.      For prescription refill requests, have your pharmacy contact our office and allow 72 hours for refills to be completed.    Today you received the following chemotherapy and/or immunotherapy agents Nivolumab, Ipilimumab   To help prevent nausea and vomiting after your treatment, we encourage you to take your nausea medication as directed.  BELOW ARE SYMPTOMS THAT SHOULD BE REPORTED IMMEDIATELY: *FEVER GREATER THAN 100.4 F (38 C) OR HIGHER *CHILLS OR SWEATING *NAUSEA AND VOMITING THAT IS NOT CONTROLLED WITH YOUR NAUSEA MEDICATION *UNUSUAL SHORTNESS OF BREATH *UNUSUAL BRUISING OR BLEEDING *URINARY PROBLEMS (pain or burning when urinating, or frequent urination) *BOWEL PROBLEMS (unusual diarrhea, constipation, pain near the anus) TENDERNESS IN MOUTH AND THROAT WITH OR WITHOUT PRESENCE OF ULCERS (sore throat, sores in mouth, or a toothache) UNUSUAL RASH, SWELLING OR PAIN  UNUSUAL VAGINAL DISCHARGE OR ITCHING   Items with * indicate a potential emergency and should be followed up as soon as possible or go to the Emergency Department if any problems should occur.  Please show the CHEMOTHERAPY ALERT CARD or IMMUNOTHERAPY ALERT CARD at  check-in to the Emergency Department and triage nurse.  Should you have questions after your visit or need to cancel or reschedule your appointment, please contact [facility]  Dept: [phone]  and follow the prompts.  Office hours are 8:00 a.m. to 4:30 p.m. Monday - Friday. Please note that voicemails left after 4:00 p.m. may not be returned until the following business day.  We are closed weekends and major holidays. You have access to a nurse at all times for urgent questions. Please call the main number to the clinic Dept: [phone] and follow the prompts.   For any non-urgent questions, you may also contact your provider using MyChart. We now offer e-Visits for anyone 60 and older to request care online for non-urgent symptoms. For details visit [email or web address].   Also download the MyChart app! Go to the app store, search "MyChart", open the app, select [facility], and log in with your MyChart username and password.  Due to Covid, a mask is required upon entering the hospital/clinic. If you do not have a mask, one will be given to you upon arrival. For doctor visits, patients may have 1 support person aged 59 or older with them. For treatment visits, patients cannot have anyone with them due to current Covid guidelines and our immunocompromised population.   Nivolumab injection What is this medicine? NIVOLUMAB (nye VOL ue mab) is a monoclonal antibody. It treats certain types of cancer. Some of the cancers treated are colon cancer, head and neck cancer, Hodgkin lymphoma, lung cancer, and melanoma. This medicine may be used for other purposes; ask your health care  provider or pharmacist if you have questions. COMMON BRAND NAME(S): Opdivo What should I tell my health care provider before I take this medicine? They need to know if you have any of these conditions: autoimmune diseases like Crohn's disease, ulcerative colitis, or lupus have had or  planning to have an allogeneic stem cell transplant (uses someone else's stem cells) history of chest radiation history of organ transplant nervous system problems like myasthenia gravis or Guillain-Barre syndrome an unusual or allergic reaction to nivolumab, other medicines, foods, dyes, or preservatives pregnant or trying to get pregnant breast-feeding How should I use this medicine? This medicine is for infusion into a vein. It is given by a health care professional in a hospital or clinic setting. A special MedGuide will be given to you before each treatment. Be sure to read this information carefully each time. Talk to your pediatrician regarding the use of this medicine in children. While this drug may be prescribed for children as young as 12 years for selected conditions, precautions do apply. Overdosage: If you think you have taken too much of this medicine contact a poison control center or emergency room at once. NOTE: This medicine is only for you. Do not share this medicine with others. What if I miss a dose? It is important not to miss your dose. Call your doctor or health care professional if you are unable to keep an appointment. What may interact with this medicine? Interactions have not been studied. This list may not describe all possible interactions. Give your health care provider a list of all the medicines, herbs, non-prescription drugs, or dietary supplements you use. Also tell them if you smoke, drink alcohol, or use illegal drugs. Some items may interact with your medicine. What should I watch for while using this medicine? This drug may make you feel generally unwell. Continue your course of treatment even though you feel ill unless your doctor tells you to stop. You may need blood work done while you are taking this medicine. Do not become pregnant while taking this medicine or for 5 months after stopping it. Women should inform their doctor if they wish to become  pregnant or think they might be pregnant. There is a potential for serious side effects to an unborn child. Talk to your health care professional or pharmacist for more information. Do not breast-feed an infant while taking this medicine or for 5 months after stopping it. What side effects may I notice from receiving this medicine? Side effects that you should report to your doctor or health care professional as soon as possible: allergic reactions like skin rash, itching or hives, swelling of the face, lips, or tongue breathing problems blood in the urine bloody or watery diarrhea or black, tarry stools changes in emotions or moods changes in vision chest pain cough dizziness feeling faint or lightheaded, falls fever, chills headache with fever, neck stiffness, confusion, loss of memory, sensitivity to light, hallucination, loss of contact with reality, or seizures joint pain mouth sores redness, blistering, peeling or loosening of the skin, including inside the mouth severe muscle pain or weakness signs and symptoms of high blood sugar such as dizziness; dry mouth; dry skin; fruity breath; nausea; stomach pain; increased hunger or thirst; increased urination signs and symptoms of kidney injury like trouble passing urine or change in the amount of urine signs and symptoms of liver injury like dark yellow or brown urine; general ill feeling or flu-like symptoms; light-colored stools; loss of appetite; nausea; right  upper belly pain; unusually weak or tired; yellowing of the eyes or skin swelling of the ankles, feet, hands trouble passing urine or change in the amount of urine unusually weak or tired weight gain or loss Side effects that usually do not require medical attention (report to your doctor or health care professional if they continue or are bothersome): bone pain constipation decreased appetite diarrhea muscle pain nausea, vomiting tiredness This list may not describe all  possible side effects. Call your doctor for medical advice about side effects. You may report side effects to FDA at 1-800-FDA-1088. Where should I keep my medicine? This drug is given in a hospital or clinic and will not be stored at home. NOTE: This sheet is a summary. It may not cover all possible information. If you have questions about this medicine, talk to your doctor, pharmacist, or health care provider.  2021 Elsevier/Gold Standard (2020-02-04 10:08:25)  Ipilimumab injection What is this medicine? IPILIMUMAB (IP i LIM ue mab) is a monoclonal antibody. It is used to treat colorectal cancer, kidney cancer, liver cancer, lung cancer, melanoma, and mesothelioma. This medicine may be used for other purposes; ask your health care provider or pharmacist if you have questions. COMMON BRAND NAME(S): YERVOY What should I tell my health care provider before I take this medicine? They need to know if you have any of these conditions: autoimmune diseases like Crohn's disease, ulcerative colitis, or lupus have had or planning to have an allogeneic stem cell transplant (uses someone else's stem cells) history of organ transplant nervous system problems like myasthenia gravis or Guillain-Barre syndrome an unusual or allergic reaction to ipilimumab, other medicines, foods, dyes, or preservatives pregnant or trying to get pregnant breast-feeding How should I use this medicine? This medicine is for infusion into a vein. It is given by a health care professional in a hospital or clinic setting. A special MedGuide will be given to you before each treatment. Be sure to read this information carefully each time. Talk to your pediatrician regarding the use of this medicine in children. While this drug may be prescribed for children as young as 12 years for selected conditions, precautions do apply. Overdosage: If you think you have taken too much of this medicine contact a poison control center or  emergency room at once. NOTE: This medicine is only for you. Do not share this medicine with others. What if I miss a dose? It is important not to miss your dose. Call your doctor or health care professional if you are unable to keep an appointment. What may interact with this medicine? Interactions are not expected. This list may not describe all possible interactions. Give your health care provider a list of all the medicines, herbs, non-prescription drugs, or dietary supplements you use. Also tell them if you smoke, drink alcohol, or use illegal drugs. Some items may interact with your medicine. What should I watch for while using this medicine? Tell your doctor or healthcare professional if your symptoms do not start to get better or if they get worse. Do not become pregnant while taking this medicine or for 3 months after stopping it. Women should inform their doctor if they wish to become pregnant or think they might be pregnant. There is a potential for serious side effects to an unborn child. Talk to your health care professional or pharmacist for more information. Do not breast-feed an infant while taking this medicine or for 3 months after the last dose. Your condition will be  monitored carefully while you are receiving this medicine. You may need blood work done while you are taking this medicine. What side effects may I notice from receiving this medicine? Side effects that you should report to your doctor or health care professional as soon as possible: allergic reactions like skin rash, itching or hives, swelling of the face, lips, or tongue black, tarry stools bloody or watery diarrhea changes in vision dizziness eye pain fast, irregular heartbeat feeling anxious feeling faint or lightheaded, falls nausea, vomiting pain, tingling, numbness in the hands or feet redness, blistering, peeling or loosening of the skin, including inside the mouth signs and symptoms of liver injury  like dark yellow or brown urine; general ill feeling or flu-like symptoms; light-colored stools; loss of appetite; nausea; right upper belly pain; unusually weak or tired; yellowing of the eyes or skin unusual bleeding or bruising Side effects that usually do not require medical attention (report to your doctor or health care professional if they continue or are bothersome): headache loss of appetite trouble sleeping This list may not describe all possible side effects. Call your doctor for medical advice about side effects. You may report side effects to FDA at 1-800-FDA-1088. Where should I keep my medicine? This drug is given in a hospital or clinic and will not be stored at home. NOTE: This sheet is a summary. It may not cover all possible information. If you have questions about this medicine, talk to your doctor, pharmacist, or health care provider.  2021 Elsevier/Gold Standard (2019-09-03 18:53:00)

## 2021-04-28 DIAGNOSIS — L820 Inflamed seborrheic keratosis: Secondary | ICD-10-CM | POA: Diagnosis not present

## 2021-04-28 DIAGNOSIS — D2271 Melanocytic nevi of right lower limb, including hip: Secondary | ICD-10-CM | POA: Diagnosis not present

## 2021-04-28 DIAGNOSIS — Z85820 Personal history of malignant melanoma of skin: Secondary | ICD-10-CM | POA: Diagnosis not present

## 2021-04-28 DIAGNOSIS — D2262 Melanocytic nevi of left upper limb, including shoulder: Secondary | ICD-10-CM | POA: Diagnosis not present

## 2021-04-28 DIAGNOSIS — D2261 Melanocytic nevi of right upper limb, including shoulder: Secondary | ICD-10-CM | POA: Diagnosis not present

## 2021-05-04 ENCOUNTER — Other Ambulatory Visit: Payer: Self-pay

## 2021-05-04 ENCOUNTER — Inpatient Hospital Stay: Payer: BC Managed Care – PPO | Attending: Oncology

## 2021-05-04 ENCOUNTER — Inpatient Hospital Stay (HOSPITAL_BASED_OUTPATIENT_CLINIC_OR_DEPARTMENT_OTHER): Payer: BC Managed Care – PPO | Admitting: Oncology

## 2021-05-04 ENCOUNTER — Inpatient Hospital Stay: Payer: BC Managed Care – PPO

## 2021-05-04 VITALS — BP 101/78 | HR 79 | Temp 98.0°F | Resp 18 | Wt 140.5 lb

## 2021-05-04 DIAGNOSIS — C439 Malignant melanoma of skin, unspecified: Secondary | ICD-10-CM | POA: Diagnosis not present

## 2021-05-04 DIAGNOSIS — Z79899 Other long term (current) drug therapy: Secondary | ICD-10-CM | POA: Diagnosis not present

## 2021-05-04 DIAGNOSIS — Z5112 Encounter for antineoplastic immunotherapy: Secondary | ICD-10-CM | POA: Diagnosis not present

## 2021-05-04 DIAGNOSIS — C4372 Malignant melanoma of left lower limb, including hip: Secondary | ICD-10-CM | POA: Diagnosis not present

## 2021-05-04 LAB — CBC WITH DIFFERENTIAL (CANCER CENTER ONLY)
Abs Immature Granulocytes: 0.01 10*3/uL (ref 0.00–0.07)
Basophils Absolute: 0 10*3/uL (ref 0.0–0.1)
Basophils Relative: 1 %
Eosinophils Absolute: 0.2 10*3/uL (ref 0.0–0.5)
Eosinophils Relative: 4 %
HCT: 38.9 % (ref 36.0–46.0)
Hemoglobin: 12.6 g/dL (ref 12.0–15.0)
Immature Granulocytes: 0 %
Lymphocytes Relative: 35 %
Lymphs Abs: 1.8 10*3/uL (ref 0.7–4.0)
MCH: 26.4 pg (ref 26.0–34.0)
MCHC: 32.4 g/dL (ref 30.0–36.0)
MCV: 81.6 fL (ref 80.0–100.0)
Monocytes Absolute: 0.5 10*3/uL (ref 0.1–1.0)
Monocytes Relative: 10 %
Neutro Abs: 2.5 10*3/uL (ref 1.7–7.7)
Neutrophils Relative %: 50 %
Platelet Count: 246 10*3/uL (ref 150–400)
RBC: 4.77 MIL/uL (ref 3.87–5.11)
RDW: 17.1 % — ABNORMAL HIGH (ref 11.5–15.5)
WBC Count: 5.1 10*3/uL (ref 4.0–10.5)
nRBC: 0 % (ref 0.0–0.2)

## 2021-05-04 LAB — CMP (CANCER CENTER ONLY)
ALT: 17 U/L (ref 0–44)
AST: 15 U/L (ref 15–41)
Albumin: 3.7 g/dL (ref 3.5–5.0)
Alkaline Phosphatase: 56 U/L (ref 38–126)
Anion gap: 8 (ref 5–15)
BUN: 11 mg/dL (ref 8–23)
CO2: 26 mmol/L (ref 22–32)
Calcium: 9.1 mg/dL (ref 8.9–10.3)
Chloride: 108 mmol/L (ref 98–111)
Creatinine: 0.63 mg/dL (ref 0.44–1.00)
GFR, Estimated: >60 mL/min (ref >60)
Glucose, Bld: 90 mg/dL (ref 70–99)
Potassium: 3.7 mmol/L (ref 3.5–5.1)
Sodium: 142 mmol/L (ref 135–145)
Total Bilirubin: 0.6 mg/dL (ref 0.3–1.2)
Total Protein: 7 g/dL (ref 6.5–8.1)

## 2021-05-04 LAB — TSH: TSH: 0.672 u[IU]/mL (ref 0.308–3.960)

## 2021-05-04 MED ORDER — SODIUM CHLORIDE 0.9 % IV SOLN
1.0000 mg/kg | Freq: Once | INTRAVENOUS | Status: AC
  Administered 2021-05-04: 68 mg via INTRAVENOUS
  Filled 2021-05-04: qty 6.8

## 2021-05-04 MED ORDER — SODIUM CHLORIDE 0.9% FLUSH
10.0000 mL | INTRAVENOUS | Status: DC | PRN
  Filled 2021-05-04: qty 10

## 2021-05-04 MED ORDER — FAMOTIDINE 20 MG IN NS 100 ML IVPB
20.0000 mg | Freq: Once | INTRAVENOUS | Status: AC
  Administered 2021-05-04: 20 mg via INTRAVENOUS

## 2021-05-04 MED ORDER — SODIUM CHLORIDE 0.9 % IV SOLN
3.0000 mg/kg | Freq: Once | INTRAVENOUS | Status: AC
  Administered 2021-05-04: 200 mg via INTRAVENOUS
  Filled 2021-05-04: qty 40

## 2021-05-04 MED ORDER — SODIUM CHLORIDE 0.9 % IV SOLN
Freq: Once | INTRAVENOUS | Status: AC
  Filled 2021-05-04: qty 250

## 2021-05-04 MED ORDER — DIPHENHYDRAMINE HCL 50 MG/ML IJ SOLN
INTRAMUSCULAR | Status: AC
  Filled 2021-05-04: qty 1

## 2021-05-04 MED ORDER — FAMOTIDINE 20 MG IN NS 100 ML IVPB
INTRAVENOUS | Status: AC
  Filled 2021-05-04: qty 100

## 2021-05-04 MED ORDER — DIPHENHYDRAMINE HCL 50 MG/ML IJ SOLN
25.0000 mg | Freq: Once | INTRAMUSCULAR | Status: AC
  Administered 2021-05-04: 25 mg via INTRAVENOUS

## 2021-05-04 MED ORDER — HEPARIN SOD (PORK) LOCK FLUSH 100 UNIT/ML IV SOLN
500.0000 [IU] | Freq: Once | INTRAVENOUS | Status: DC | PRN
Start: 2021-05-04 — End: 2021-05-04
  Filled 2021-05-04: qty 5

## 2021-05-04 NOTE — Progress Notes (Signed)
Hematology and Oncology Follow Up  Sharon Kirk [id number] Sep 26, 1961 60 y.o. 05/04/2021 12:30 PM Sharon Face, MD       Principle Diagnosis: 60 year old woman with melanoma of the lower extremity diagnosed in 2017.  She developed stage IV BRAF wild-type tumor with adenopathy melanoma with abdominal adenopathy in May 2022.  Secondary diagnoses:    1. Superficial spreading melanoma of the lower extremity diagnosed in August 2017. She was found to have 1.25 mm thickness without ulceration or lymphovascular invasion. She had 1 out of 2 lymph nodes positive. The size of the lymph node measuring 0.25 x 0.95 mm with pathological staging T2bN1.    2. 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 These findings indicate a pathological staging of T1 NA.     Prior Therapy: She is status post local excision followed by sentinel lymph node sampling with 2 lymph nodes sampled one was involved with metastatic melanoma. The measurement was 0.25 x 0.95 mm in dimension of the capsule. Her final pathological stage stage is T2bN1. This was completed on 05/23/2016.    Nivolumab total of 240 mg dose every 2 weeks for total of 4 doses. Started on 07/21/2016.  She completed 4 cycles of therapy for a total of 2 months out of planned 12.  Patient opted out continuing immunotherapy.  She is status post lymph node biopsy completed on Feb 15, 2021.     Current therapy:   Ipilimumab 3 mg/kg with a nivolumab at 1 mg/kg started on Mar 04, 2021.  She is here for cycle 4 of therapy.   Interim History: Sharon Kirk is here for a follow-up visit.  Since her last visit, she reports no major complications to therapy.  She reports no nausea, fatigue or skin rash.  She denies any diarrhea or any recent hospitalizations.  She denies any headaches or blurry vision.  She denies any neurological deficits.  She  denies any abdominal pain or back pain.  She denies any worsening edema.    Medications: Reviewed without changes. Current Outpatient Medications  Medication Sig Dispense Refill   albuterol (VENTOLIN HFA) 108 (90 Base) MCG/ACT inhaler Inhale 1 puff into the lungs every 4 (four) hours as needed for wheezing or shortness of breath. 1 each 2   apixaban (ELIQUIS) 5 MG TABS tablet Take 1 tablet (5 mg total) by mouth 2 (two) times daily. 60 tablet 2   calcium carbonate (OS-CAL) 600 MG TABS Take 600 mg by mouth 2 (two) times daily with a meal.     cholecalciferol (VITAMIN D) 1000 UNITS tablet Take 5,000 Units by mouth daily.     Cyanocobalamin (VITAMIN B-12 CR PO) Take 1 tablet by mouth daily.     HYDROcodone-acetaminophen (NORCO) 5-325 MG tablet Take 1 tablet by mouth every 6 (six) hours as needed for moderate pain. 30 tablet 0   LORazepam (ATIVAN) 1 MG tablet Take one tablet one hour before MRI 30 tablet 0   prochlorperazine (COMPAZINE) 10 MG tablet Take 1 tablet (10 mg total) by mouth every 6 (six) hours as needed for nausea or vomiting. 30 tablet 0   venlafaxine XR (EFFEXOR XR) 37.5 MG 24 hr capsule Take 1 capsule (37.5 mg total) by mouth daily with breakfast. 90 capsule 0   No current facility-administered medications for this visit.     Allergies:  Allergies  Allergen Reactions   Codeine Nausea  Only and Other (See Comments)    Hallucinations; confusion   Physical exam  Blood pressure 101/78, pulse 79, temperature 98 F (36.7 C), temperature source Oral, resp. rate 18, weight 140 lb 8 oz (63.7 kg), last menstrual period 06/30/1990, SpO2 99 %.   ECOG 0    General appearance: Alert, awake without any distress. Head: Atraumatic without abnormalities Oropharynx: Without any thrush or ulcers. Eyes: No scleral icterus. Lymph nodes: No lymphadenopathy noted in the cervical, supraclavicular, or axillary nodes Heart:regular rate and rhythm, without any murmurs or gallops.  Trace left  lower extremity edema. Lung: Clear to auscultation without any rhonchi, wheezes or dullness to percussion. Abdomin: Soft, nontender without any shifting dullness or ascites. Musculoskeletal: No clubbing or cyanosis. Neurological: No motor or sensory deficits. Skin: No rashes or lesions.        Lab Results: Lab Results  Component Value Date   WBC 5.9 04/13/2021   HGB 12.3 04/13/2021   HCT 38.8 04/13/2021   MCV 81.3 04/13/2021   PLT 317 04/13/2021     Chemistry      Component Value Date/Time   NA 139 04/13/2021 1104   NA 140 09/08/2016 0755   K 4.6 04/13/2021 1104   K 3.9 09/08/2016 0755   CL 104 04/13/2021 1104   CO2 26 04/13/2021 1104   CO2 23 09/08/2016 0755   BUN 7 (L) 04/13/2021 1104   BUN 11.2 09/08/2016 0755   CREATININE 0.62 04/13/2021 1104   CREATININE 0.61 09/30/2019 0839   CREATININE 0.7 09/08/2016 0755      Component Value Date/Time   CALCIUM 10.0 04/13/2021 1104   CALCIUM 9.1 09/08/2016 0755   ALKPHOS 77 04/13/2021 1104   ALKPHOS 61 09/08/2016 0755   AST 13 (L) 04/13/2021 1104   AST 13 09/08/2016 0755   ALT 15 04/13/2021 1104   ALT 16 09/08/2016 0755   BILITOT 0.3 04/13/2021 1104   BILITOT 0.42 09/08/2016 0755        Impression and Plan:   59 year old woman with:  1.  Melanoma of the lower extremity diagnosed 2017.  She developed BRAF wild-type, stage IV disease with abdominal adenopathy.  She is currently on immunotherapy and has tolerated ipilimumab and nivolumab without any major complications.  Risks and benefits of proceeding with cycle 4 of therapy were discussed.  Potential complications including autoimmune considerations, GI toxicity as well as dermatological issues.  The plan is to update her staging scans before the next visit and switch to nivolumab maintenance pending her response.  He is agreeable to proceed at this time.  2.  IV access: No issues reported with her peripheral veins at this time.  3.  Antiemetics: No nausea or  vomiting reported at this time Compazine is available to her.   4.  Autoimmune complications: I continue to educate her about potential issues including pneumonitis, colitis and thyroid disease.  She is not experiencing any at this time.   5.  CNS staging: No evidence of metastatic disease based on imaging studies in May 2022.   6.  Back pain and abdominal pain: Improved after initial treatment for her malignancy.  7.  Lower extremity swelling: Resolving as a response to therapy.  Edema is likely related to malignancy and lymphedema.  8.  Treatment goal and prognosis: Her disease is incurable although aggressive measures are warranted given her excellent performance status.  9.  Follow-up: In 3 weeks for the next cycle of treatment.     30  minutes were  spent on this encounter.  The time was spent on reviewing laboratory data, disease status update, treatment choices and answering questions regarding prognosis and future management.   Zola Button, MD 05/04/2021 12:30 PM

## 2021-05-04 NOTE — Progress Notes (Signed)
Ipilimumab (YERVOY) Patient Monitoring Assessment   Is the patient experiencing any of the following general symptoms?:  [] Difficulty performing normal activities [] Feeling sluggish or cold all the time [] Unusual weight gain [] Constant or unusual headaches [] Feeling dizzy or faint [] Changes in eyesight (blurry vision, double vision, or other vision problems) [] Changes in mood or behavior (ex: decreased sex drive, irritability, or forgetfulness) [] Starting new medications (ex: steroids, other medications that lower immune response) [x] Patient is not experiencing any of the general symptoms above.    Gastrointestinal  Patient is having 1 bowel movement every 3rd day Is this different from baseline? [] Yes [x] No Are your stools watery or do they have a foul smell? [] Yes [x] No Have you seen blood in your stools? [] Yes [x] No Are your stools dark, tarry, or sticky? [] Yes [x] No Are you having pain or tenderness in your belly? [] Yes [x] No  Skin Does your skin itch? [x] Yes [] No Do you have a rash? [] Yes [x] No Has your skin blistered and/or peeled? [] Yes [x] No Do you have sores in your mouth? [] Yes [x] No  Hepatic Has your urine been dark or tea colored? [] Yes [x] No Have you noticed that your skin or the whites of your eyes are turning yellow? [] Yes [x] No Are you bleeding or bruising more easily than normal? [] Yes [x] No Are you nauseous and/or vomiting? [] Yes [x] No Do you have pain on the right side of your stomach? [] Yes [x] No  Neurologic  Are you having unusual weakness of legs, arms, or face? [] Yes [x] No Are you having numbness or tingling in your hands or feet? [] Yes [x] No  Rogan Ecklund E Zakia Sainato  Dr. Alen Blew aware, OK to proceed

## 2021-05-11 ENCOUNTER — Telehealth: Payer: Self-pay

## 2021-05-11 NOTE — Telephone Encounter (Signed)
Pt called requesting letter to return to work 05/30/21. Letter faxed to [facility] per pt's request. Fax confirmation received. Pt is aware and verbalized thanks.

## 2021-05-18 ENCOUNTER — Ambulatory Visit (HOSPITAL_COMMUNITY)
Admission: RE | Admit: 2021-05-18 | Discharge: 2021-05-18 | Disposition: A | Discharge status: Home / Self Care | Payer: BC Managed Care – PPO | Source: Ambulatory Visit | Attending: Oncology | Admitting: Oncology

## 2021-05-18 ENCOUNTER — Other Ambulatory Visit: Payer: Self-pay

## 2021-05-18 ENCOUNTER — Encounter (HOSPITAL_COMMUNITY): Payer: Self-pay

## 2021-05-18 DIAGNOSIS — J479 Bronchiectasis, uncomplicated: Secondary | ICD-10-CM | POA: Diagnosis not present

## 2021-05-18 DIAGNOSIS — C439 Malignant melanoma of skin, unspecified: Secondary | ICD-10-CM | POA: Insufficient documentation

## 2021-05-18 DIAGNOSIS — C4372 Malignant melanoma of left lower limb, including hip: Secondary | ICD-10-CM | POA: Diagnosis not present

## 2021-05-18 DIAGNOSIS — J929 Pleural plaque without asbestos: Secondary | ICD-10-CM | POA: Diagnosis not present

## 2021-05-18 DIAGNOSIS — I700 Atherosclerosis of aorta: Secondary | ICD-10-CM | POA: Diagnosis not present

## 2021-05-18 DIAGNOSIS — D7389 Other diseases of spleen: Secondary | ICD-10-CM | POA: Diagnosis not present

## 2021-05-18 IMAGING — CT CT CHEST-ABD-PELV W/ CM
2 of 5 series · 12 of 36 positions shown, 14 images · IV contrast (APPLIED)
Comparison: [DATE].

CLINICAL DATA: Recurrent melanoma in the LEFT leg in [TC]. Assess
treatment response in the setting of ongoing immunotherapy.

EXAM:
CT CHEST, ABDOMEN, AND PELVIS WITH CONTRAST
TECHNIQUE: Multidetector CT imaging of the chest, abdomen and pelvis was
performed following the standard protocol during bolus
administration of intravenous contrast.
CONTRAST:  75mL OMNIPAQUE IOHEXOL 350 MG/ML SOLN

[Series 2: cap with · axial · 0.68mm/px · z∈[-622,-82]mm · 9 of 133 slices shown, 11 images]
[im 13/133  mediastinal]
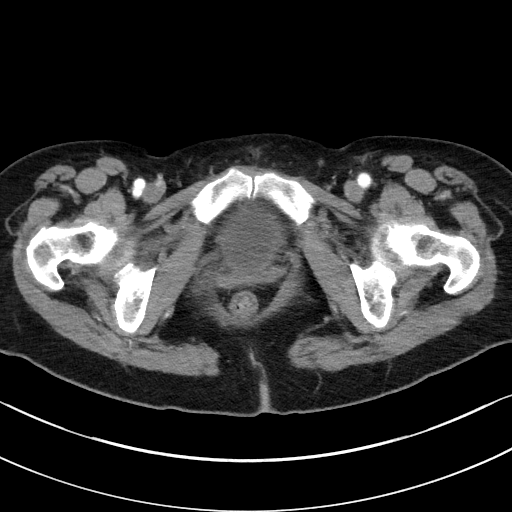
[im 13/133  bone]
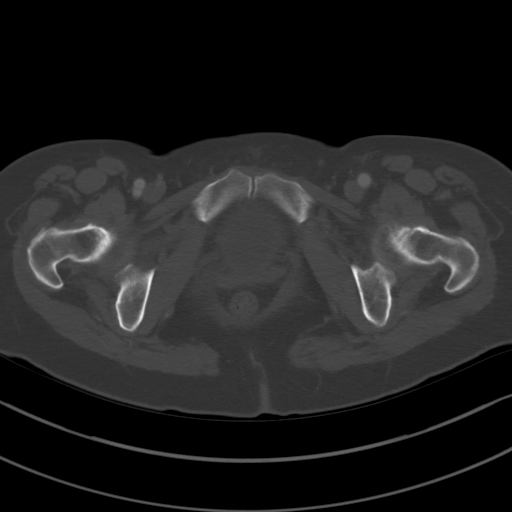
[im 25/133  mediastinal]
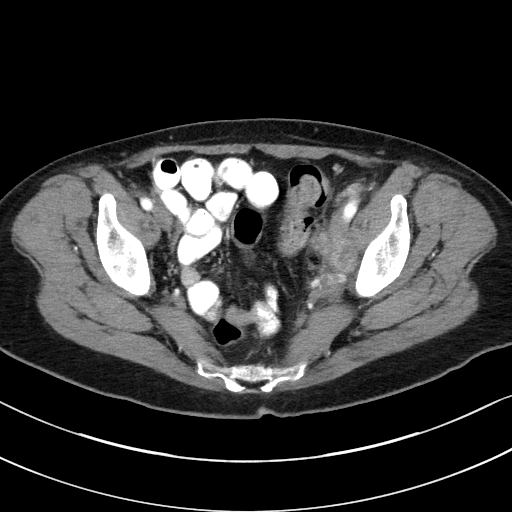
[im 37/133  mediastinal]
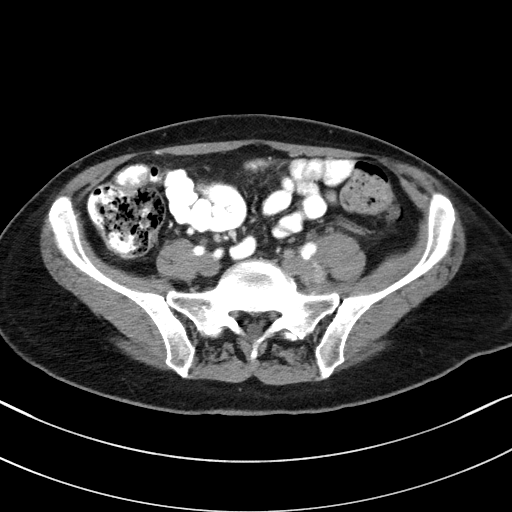
[im 49/133  mediastinal]
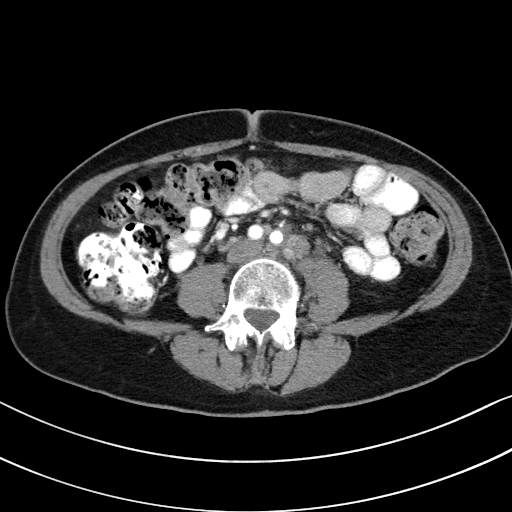
[im 73/133  mediastinal]
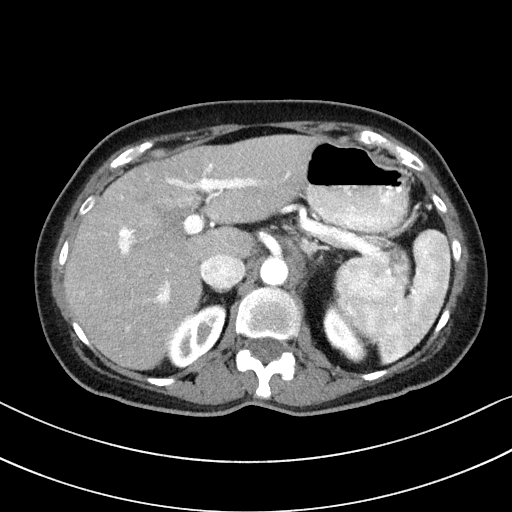
[im 85/133  mediastinal]
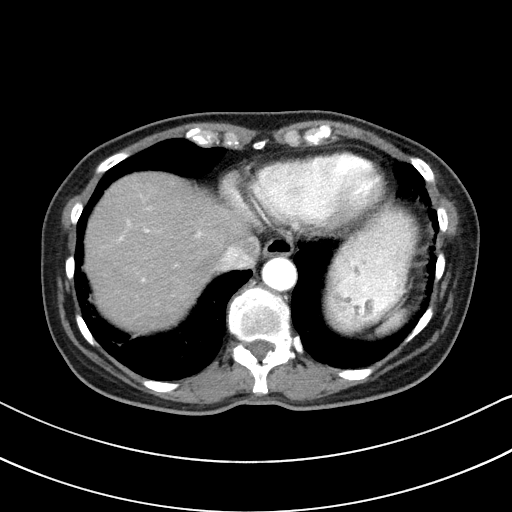
[im 97/133  mediastinal]
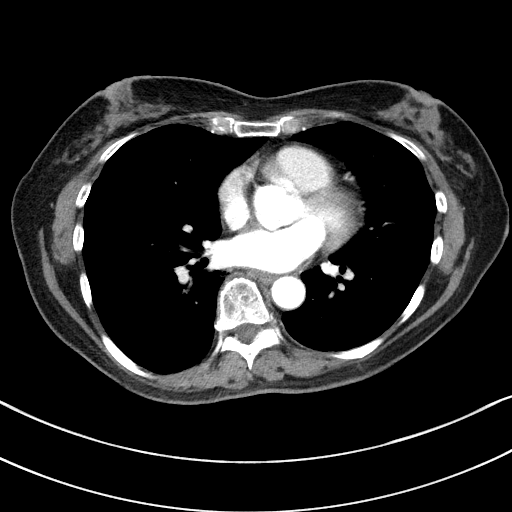
[im 109/133  mediastinal]
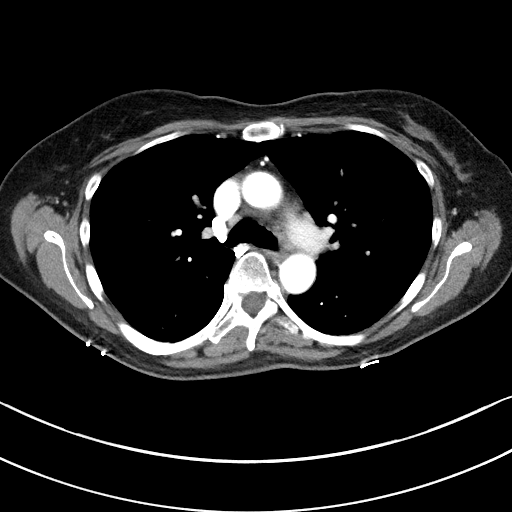
[im 121/133  mediastinal]
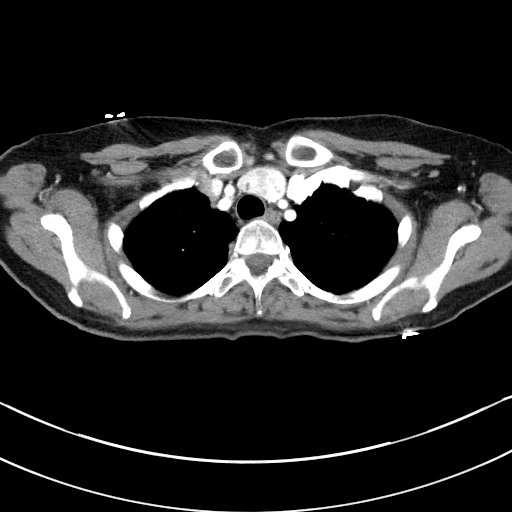
[im 121/133  bone]
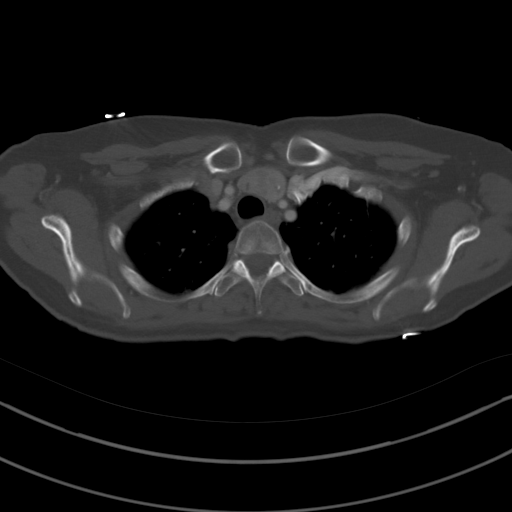

[Series 4: coronals · coronal · 0.64mm/px · 3 of 124 slices shown]
[im 25/124  mediastinal]
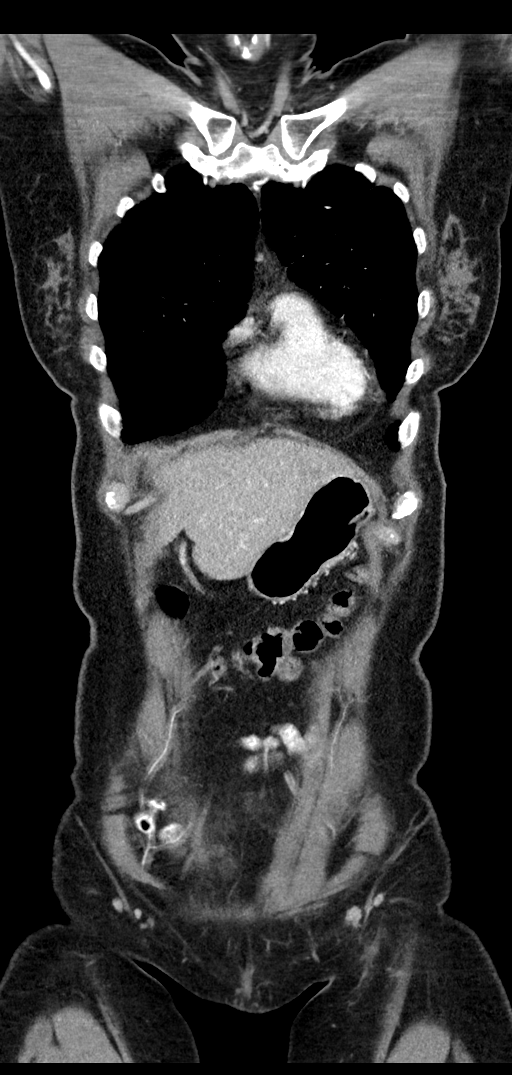
[im 50/124  mediastinal]
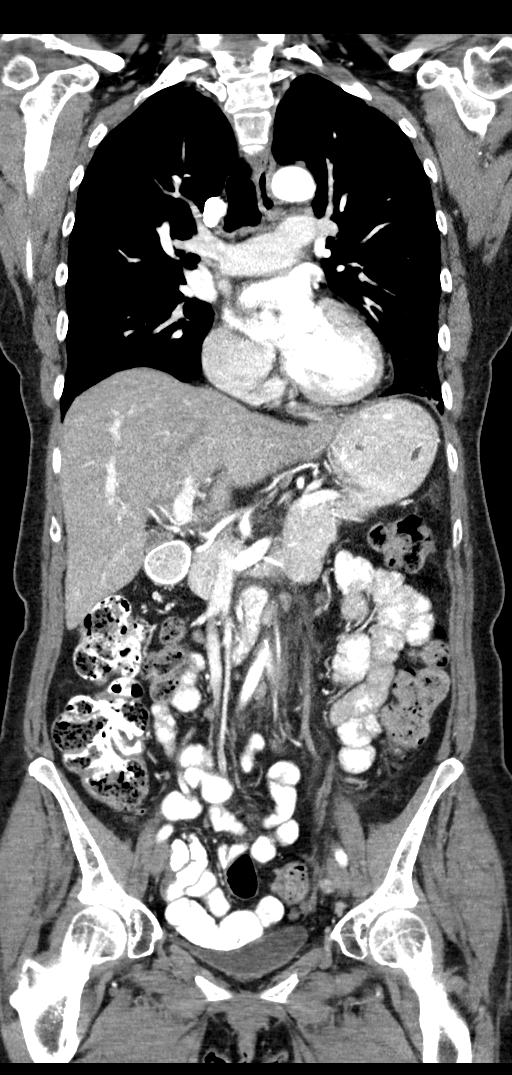
[im 74/124  mediastinal]
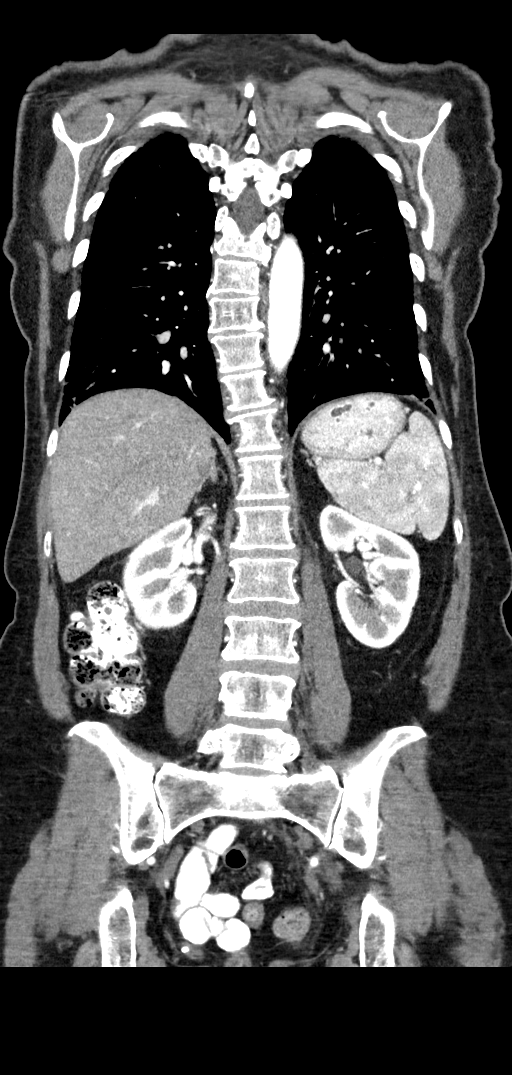

[12 of 36 positions shown; findings below may reference images not displayed]

FINDINGS: CT CHEST FINDINGS

Cardiovascular: Heart size is normal. No pericardial effusion.
Normal caliber of the central pulmonary vasculature. The normal
caliber of the thoracic aorta.

Mediastinum/Nodes: Mildly patulous esophagus. The no thoracic inlet
lymphadenopathy. No axillary lymphadenopathy. No mediastinal
lymphadenopathy. Heterogeneous and mildly enlarged LEFT thyroid.
Focal lesion at 1.8 cm in the LEFT thyroid (image [DATE])

Lungs/Pleura: Mild septal thickening and subpleural reticulation in
the chest. Likely superimposed upon atelectatic changes.

(Image 45/6) 4 mm RIGHT upper lobe pulmonary nodule.

(Image 54/6) 5 mm ground-glass nodule in the RIGHT upper lobe.

(Image 73/6): 4 mm RIGHT lower lobe pulmonary nodule.

Musculoskeletal: See below for full musculoskeletal details.

CT ABDOMEN PELVIS FINDINGS

Hepatobiliary: No focal, suspicious hepatic lesion. No
pericholecystic stranding. No biliary duct dilation. Portal vein is
patent.

Pancreas: Normal, without mass, inflammation or ductal dilatation.

Spleen: Hypodense lesion in the medial spleen 1.4 cm, within 2-3 mm
of previous size.

Hypodense area in the upper spleen (image 52/2) 11 mm, stable. Not
as well seen on prior imaging however.

Adrenals/Urinary Tract: Adrenal glands are normal.

Symmetric renal enhancement.

No hydronephrosis.  No perivesical stranding.

Stomach/Bowel: No acute gastrointestinal findings.

Vascular/Lymphatic: Bulky adenopathy in the retroperitoneum with
interval decrease in size, considerable diminished size compared to
the prior study. The LEFT para-aortic lymph node 2.4 cm at the site
of previous 7.7 x 9.0 cm LEFT retroperitoneal mass. There is still
soft tissue that is contiguous with this lymph node that encases
LEFT renal vasculature (image 67/2) 2.4 x 2.7 cm.

LEFT common iliac lymph node (image 86/2 and (image 88/2 these lymph
nodes measuring approximately 1.3 and 1.4 cm at the site where there
was previously a 3.6 cm nodal mass.

Similar decrease in size of a 5.6 x 5.6 cm LEFT external iliac nodal
mass currently approximately 2.1 x 1.7 cm.

Decreased size of external iliac/pelvic sidewall lymph nodes to a
similar extent.

No signs of aortic dilation. Smooth contour of the IVC.
Calcifications of the abdominal aorta are mild.

Reproductive: Post hysterectomy without signs of adnexal mass.

Other: No ascites.

Musculoskeletal: No acute musculoskeletal process. Spinal
degenerative changes.
IMPRESSION: 1. Marked interval response to therapy with respect to abdominal and
pelvic lymph nodes as described.
2. Small pulmonary nodules, nonspecific, suggest short interval
follow-up in this patient with metastatic melanoma.
3. Stable hypodense lesions in the spleen, potentially metastatic
perhaps slightly smaller size of the dominant lesion. Attention on
follow-up.
4. Findings of mild ground-glass along subpleural lower lobes and
mild septal thickening/subpleural reticulation with mild
bronchiolectasis particularly in the RIGHT lower lobe. Findings are
likely mixed with atelectasis. Findings with pattern of "probable
UIP" but have developed since previous imaging. Correlate with any
respiratory symptoms in this patient with ongoing immunotherapy.
Could consider follow-up with high-resolution chest CT as warranted.
5. Aortic atherosclerosis.

Aortic Atherosclerosis ([TC]-[TC]).

## 2021-05-18 MED ORDER — IOHEXOL 350 MG/ML SOLN
100.0000 mL | Freq: Once | INTRAVENOUS | Status: AC | PRN
  Administered 2021-05-18: 75 mL via INTRAVENOUS

## 2021-05-27 ENCOUNTER — Inpatient Hospital Stay: Payer: BC Managed Care – PPO | Attending: Oncology

## 2021-05-27 ENCOUNTER — Other Ambulatory Visit: Payer: Self-pay

## 2021-05-27 ENCOUNTER — Inpatient Hospital Stay (HOSPITAL_BASED_OUTPATIENT_CLINIC_OR_DEPARTMENT_OTHER): Payer: BC Managed Care – PPO | Admitting: Oncology

## 2021-05-27 ENCOUNTER — Inpatient Hospital Stay: Payer: BC Managed Care – PPO

## 2021-05-27 VITALS — BP 109/75 | HR 77 | Temp 98.2°F | Resp 18 | Ht 65.5 in | Wt 139.8 lb

## 2021-05-27 DIAGNOSIS — Z5112 Encounter for antineoplastic immunotherapy: Secondary | ICD-10-CM | POA: Diagnosis not present

## 2021-05-27 DIAGNOSIS — C439 Malignant melanoma of skin, unspecified: Secondary | ICD-10-CM

## 2021-05-27 DIAGNOSIS — C4372 Malignant melanoma of left lower limb, including hip: Secondary | ICD-10-CM | POA: Diagnosis not present

## 2021-05-27 DIAGNOSIS — Z79899 Other long term (current) drug therapy: Secondary | ICD-10-CM | POA: Diagnosis not present

## 2021-05-27 LAB — CMP (CANCER CENTER ONLY)
ALT: 15 U/L (ref 0–44)
AST: 15 U/L (ref 15–41)
Albumin: 3.8 g/dL (ref 3.5–5.0)
Alkaline Phosphatase: 70 U/L (ref 38–126)
Anion gap: 9 (ref 5–15)
BUN: 13 mg/dL (ref 8–23)
CO2: 27 mmol/L (ref 22–32)
Calcium: 9.5 mg/dL (ref 8.9–10.3)
Chloride: 105 mmol/L (ref 98–111)
Creatinine: 0.63 mg/dL (ref 0.44–1.00)
GFR, Estimated: >60 mL/min (ref >60)
Glucose, Bld: 93 mg/dL (ref 70–99)
Potassium: 4.1 mmol/L (ref 3.5–5.1)
Sodium: 141 mmol/L (ref 135–145)
Total Bilirubin: 0.2 mg/dL — ABNORMAL LOW (ref 0.3–1.2)
Total Protein: 7.3 g/dL (ref 6.5–8.1)

## 2021-05-27 LAB — CBC WITH DIFFERENTIAL (CANCER CENTER ONLY)
Abs Immature Granulocytes: 0.01 10*3/uL (ref 0.00–0.07)
Basophils Absolute: 0.1 10*3/uL (ref 0.0–0.1)
Basophils Relative: 1 %
Eosinophils Absolute: 0.2 10*3/uL (ref 0.0–0.5)
Eosinophils Relative: 3 %
HCT: 41.7 % (ref 36.0–46.0)
Hemoglobin: 13.5 g/dL (ref 12.0–15.0)
Immature Granulocytes: 0 %
Lymphocytes Relative: 34 %
Lymphs Abs: 2 10*3/uL (ref 0.7–4.0)
MCH: 26.4 pg (ref 26.0–34.0)
MCHC: 32.4 g/dL (ref 30.0–36.0)
MCV: 81.6 fL (ref 80.0–100.0)
Monocytes Absolute: 0.5 10*3/uL (ref 0.1–1.0)
Monocytes Relative: 9 %
Neutro Abs: 3.1 10*3/uL (ref 1.7–7.7)
Neutrophils Relative %: 53 %
Platelet Count: 229 10*3/uL (ref 150–400)
RBC: 5.11 MIL/uL (ref 3.87–5.11)
RDW: 16.7 % — ABNORMAL HIGH (ref 11.5–15.5)
WBC Count: 5.9 10*3/uL (ref 4.0–10.5)
nRBC: 0 % (ref 0.0–0.2)

## 2021-05-27 LAB — TSH: TSH: 0.906 u[IU]/mL (ref 0.308–3.960)

## 2021-05-27 MED ORDER — SODIUM CHLORIDE 0.9 % IV SOLN
480.0000 mg | Freq: Once | INTRAVENOUS | Status: AC
  Administered 2021-05-27: 480 mg via INTRAVENOUS
  Filled 2021-05-27: qty 48

## 2021-05-27 MED ORDER — SODIUM CHLORIDE 0.9 % IV SOLN
Freq: Once | INTRAVENOUS | Status: AC
  Filled 2021-05-27: qty 250

## 2021-05-27 NOTE — Progress Notes (Signed)
Hematology and Oncology Follow Up  Sharon Kirk [id number] December 26, 1960 60 y.o. 05/27/2021 12:24 PM Sharon Face, MD       Principle Diagnosis: 60 year old woman with stage IV melanoma presented with abdominal adenopathy in May 2022.  She initially presented with T2BN1 lower extremity tumor in 2017.  Secondary diagnoses:    1. Superficial spreading melanoma of the lower extremity diagnosed in August 2017. She was found to have 1.25 mm thickness without ulceration or lymphovascular invasion. She had 1 out of 2 lymph nodes positive. The size of the lymph node measuring 0.25 x 0.95 mm with pathological staging T2bN1.    2. Superficial spreading melanoma of the right shoulder diagnosed in August 2017.She is status post wide local excision and found tohave a 0.72 mm. These findings indicate a pathological staging of T1 NA.     Prior Therapy: She is status post local excision followed by sentinel lymph node sampling with 2 lymph nodes sampled one was involved with metastatic melanoma. The measurement was 0.25 x 0.95 mm in dimension of the capsule. Her final pathological stage stage is T2bN1. This was completed on 05/23/2016.    Nivolumab total of 240 mg dose every 2 weeks for total of 4 doses. Started on 07/21/2016.  She completed 4 cycles of therapy for a total of 2 months out of planned 12.  Patient opted out continuing immunotherapy.  She is status post lymph node biopsy completed on Feb 15, 2021.  Ipilimumab 3 mg/kg with a nivolumab at 1 mg/kg started on Mar 04, 2021.  She is status post 4 cycles of therapy.   Current therapy:   Nivolumab 480 mg every 4 weeks to start on May 27, 2021.   Interim History: Sharon Kirk is here for a follow-up visit.  Since the last visit, she reports feeling well without any major complaints.  She has tolerated therapy without any concerns or complications.  She denies any nausea, fatigue or shortness of breath.  Her abdominal and pelvic  pain has resolved.  She denies any lower extremity edema at this time.    Medications: Unchanged on review. Current Outpatient Medications  Medication Sig Dispense Refill   albuterol (VENTOLIN HFA) 108 (90 Base) MCG/ACT inhaler Inhale 1 puff into the lungs every 4 (four) hours as needed for wheezing or shortness of breath. 1 each 2   apixaban (ELIQUIS) 5 MG TABS tablet Take 1 tablet (5 mg total) by mouth 2 (two) times daily. 60 tablet 2   calcium carbonate (OS-CAL) 600 MG TABS Take 600 mg by mouth 2 (two) times daily with a meal.     cholecalciferol (VITAMIN D) 1000 UNITS tablet Take 5,000 Units by mouth daily.     Cyanocobalamin (VITAMIN B-12 CR PO) Take 1 tablet by mouth daily.     HYDROcodone-acetaminophen (NORCO) 5-325 MG tablet Take 1 tablet by mouth every 6 (six) hours as needed for moderate pain. 30 tablet 0   LORazepam (ATIVAN) 1 MG tablet Take one tablet one hour before MRI (Patient not taking: Reported on 05/04/2021) 30 tablet 0   prochlorperazine (COMPAZINE) 10 MG tablet Take 1 tablet (10 mg total) by mouth every 6 (six) hours as needed for nausea or vomiting. 30 tablet 0   venlafaxine XR (EFFEXOR XR) 37.5 MG 24 hr capsule Take 1 capsule (37.5 mg total) by mouth daily with breakfast. 90 capsule 0   No current facility-administered medications for this visit.     Allergies:  Allergies  Allergen Reactions  Codeine Nausea Only and Other (See Comments)    Hallucinations; confusion   Physical exam   Blood pressure 109/75, pulse 77, temperature 98.2 F (36.8 C), temperature source Oral, resp. rate 18, height 5' 5.5" (1.664 m), weight 139 lb 12.8 oz (63.4 kg), last menstrual period 06/30/1990, SpO2 100 %.   ECOG 0   General appearance: Comfortable appearing without any discomfort Head: Normocephalic without any trauma Oropharynx: Mucous membranes are moist and pink without any thrush or ulcers. Eyes: Pupils are equal and round reactive to light. Lymph nodes: No cervical,  supraclavicular, inguinal or axillary lymphadenopathy.   Heart:regular rate and rhythm.  S1 and S2 without leg edema. Lung: Clear without any rhonchi or wheezes.  No dullness to percussion. Abdomin: Soft, nontender, nondistended with good bowel sounds.  No hepatosplenomegaly. Musculoskeletal: No joint deformity or effusion.  Full range of motion noted. Neurological: No deficits noted on motor, sensory and deep tendon reflex exam. Skin: No petechial rash or dryness.  Appeared moist.          Lab Results: Lab Results  Component Value Date   WBC 5.1 05/04/2021   HGB 12.6 05/04/2021   HCT 38.9 05/04/2021   MCV 81.6 05/04/2021   PLT 246 05/04/2021     Chemistry      Component Value Date/Time   NA 142 05/04/2021 1228   NA 140 09/08/2016 0755   K 3.7 05/04/2021 1228   K 3.9 09/08/2016 0755   CL 108 05/04/2021 1228   CO2 26 05/04/2021 1228   CO2 23 09/08/2016 0755   BUN 11 05/04/2021 1228   BUN 11.2 09/08/2016 0755   CREATININE 0.63 05/04/2021 1228   CREATININE 0.61 09/30/2019 0839   CREATININE 0.7 09/08/2016 0755      Component Value Date/Time   CALCIUM 9.1 05/04/2021 1228   CALCIUM 9.1 09/08/2016 0755   ALKPHOS 56 05/04/2021 1228   ALKPHOS 61 09/08/2016 0755   AST 15 05/04/2021 1228   AST 13 09/08/2016 0755   ALT 17 05/04/2021 1228   ALT 16 09/08/2016 0755   BILITOT 0.6 05/04/2021 1228   BILITOT 0.42 09/08/2016 0755     IMPRESSION: 1. Marked interval response to therapy with respect to abdominal and pelvic lymph nodes as described. 2. Small pulmonary nodules, nonspecific, suggest short interval follow-up in this patient with metastatic melanoma. 3. Stable hypodense lesions in the spleen, potentially metastatic perhaps slightly smaller size of the dominant lesion. Attention on follow-up. 4. Findings of mild ground-glass along subpleural lower lobes and mild septal thickening/subpleural reticulation with mild bronchiolectasis particularly in the RIGHT lower  lobe. Findings are likely mixed with atelectasis. Findings with pattern of "probable UIP" but have developed since previous imaging. Correlate with any respiratory symptoms in this patient with ongoing immunotherapy. Could consider follow-up with high-resolution chest CT as warranted. 5. Aortic atherosclerosis.   Impression and Plan:   60 year old woman with:  1.  Stage IV melanoma with abdominal adenopathy noted in May 2022.  She initially presented with T2N1 disease.  Her disease status was updated at this time including review of imaging studies obtained on May 18, 2021.  PET scan showed excellent response to therapy with decrease in the size of her pelvic and abdominal adenopathy.  Risks and benefits of continuing nivolumab therapy were reviewed.  Immune mediated complications among others were reiterated.  She is agreeable to continue.  2.  IV access: Peripheral veins are currently in use without any issues.  3.  Antiemetics: Compazine is available  to her without any nausea or vomiting.   4.  Autoimmune complications: Pneumonitis, colitis and thyroid disease or potential complication associated with this therapy.  We will continue to monitor while on treatment.   5.  CNS staging: This will be updated in 2023 including MRI of the brain.   6.  Back pain and abdominal pain: Related to her malignancy continues to improve.  7.  Lower extremity swelling: Related to malignancy and improved with positive response to therapy.  8.  Treatment goal and prognosis: Therapy is palliative low risk medicines are warranted given her excellent response.  9.  Follow-up: In 4 weeks for the next cycle of nivolumab maintenance.     30  minutes were dedicated to this visit.  The time was spent on reviewing laboratory data, imaging studies reviewed, treatment choices and addressing complications related to cancer and cancer therapy.   Zola Button, MD 05/27/2021 12:24 PM

## 2021-05-31 ENCOUNTER — Other Ambulatory Visit: Payer: Self-pay | Admitting: Nurse Practitioner

## 2021-05-31 DIAGNOSIS — Z1231 Encounter for screening mammogram for malignant neoplasm of breast: Secondary | ICD-10-CM

## 2021-06-08 DIAGNOSIS — Z86718 Personal history of other venous thrombosis and embolism: Secondary | ICD-10-CM | POA: Diagnosis not present

## 2021-06-08 DIAGNOSIS — Z13220 Encounter for screening for lipoid disorders: Secondary | ICD-10-CM | POA: Diagnosis not present

## 2021-06-08 DIAGNOSIS — E039 Hypothyroidism, unspecified: Secondary | ICD-10-CM | POA: Diagnosis not present

## 2021-06-08 DIAGNOSIS — C439 Malignant melanoma of skin, unspecified: Secondary | ICD-10-CM | POA: Diagnosis not present

## 2021-06-08 DIAGNOSIS — Z0000 Encounter for general adult medical examination without abnormal findings: Secondary | ICD-10-CM | POA: Diagnosis not present

## 2021-06-08 DIAGNOSIS — C779 Secondary and unspecified malignant neoplasm of lymph node, unspecified: Secondary | ICD-10-CM | POA: Diagnosis not present

## 2021-06-08 DIAGNOSIS — Z85820 Personal history of malignant melanoma of skin: Secondary | ICD-10-CM | POA: Diagnosis not present

## 2021-06-16 ENCOUNTER — Telehealth: Payer: Self-pay | Admitting: Oncology

## 2021-06-16 NOTE — Telephone Encounter (Signed)
Sch per 8/12 los, left message

## 2021-06-22 ENCOUNTER — Inpatient Hospital Stay: Payer: BC Managed Care – PPO

## 2021-06-22 ENCOUNTER — Inpatient Hospital Stay (HOSPITAL_BASED_OUTPATIENT_CLINIC_OR_DEPARTMENT_OTHER): Payer: BC Managed Care – PPO | Admitting: Oncology

## 2021-06-22 ENCOUNTER — Inpatient Hospital Stay: Payer: BC Managed Care – PPO | Attending: Oncology

## 2021-06-22 ENCOUNTER — Other Ambulatory Visit: Payer: Self-pay

## 2021-06-22 VITALS — BP 106/66 | HR 71 | Temp 98.3°F | Resp 17 | Ht 65.5 in | Wt 138.2 lb

## 2021-06-22 DIAGNOSIS — C775 Secondary and unspecified malignant neoplasm of intrapelvic lymph nodes: Secondary | ICD-10-CM | POA: Insufficient documentation

## 2021-06-22 DIAGNOSIS — Z79899 Other long term (current) drug therapy: Secondary | ICD-10-CM | POA: Insufficient documentation

## 2021-06-22 DIAGNOSIS — C439 Malignant melanoma of skin, unspecified: Secondary | ICD-10-CM

## 2021-06-22 DIAGNOSIS — C4372 Malignant melanoma of left lower limb, including hip: Secondary | ICD-10-CM | POA: Insufficient documentation

## 2021-06-22 DIAGNOSIS — Z5112 Encounter for antineoplastic immunotherapy: Secondary | ICD-10-CM | POA: Diagnosis not present

## 2021-06-22 LAB — CBC WITH DIFFERENTIAL (CANCER CENTER ONLY)
Abs Immature Granulocytes: 0.01 10*3/uL (ref 0.00–0.07)
Basophils Absolute: 0 10*3/uL (ref 0.0–0.1)
Basophils Relative: 1 %
Eosinophils Absolute: 0.1 10*3/uL (ref 0.0–0.5)
Eosinophils Relative: 3 %
HCT: 41.3 % (ref 36.0–46.0)
Hemoglobin: 13.6 g/dL (ref 12.0–15.0)
Immature Granulocytes: 0 %
Lymphocytes Relative: 36 %
Lymphs Abs: 1.8 10*3/uL (ref 0.7–4.0)
MCH: 27 pg (ref 26.0–34.0)
MCHC: 32.9 g/dL (ref 30.0–36.0)
MCV: 81.9 fL (ref 80.0–100.0)
Monocytes Absolute: 0.4 10*3/uL (ref 0.1–1.0)
Monocytes Relative: 8 %
Neutro Abs: 2.5 10*3/uL (ref 1.7–7.7)
Neutrophils Relative %: 52 %
Platelet Count: 220 10*3/uL (ref 150–400)
RBC: 5.04 MIL/uL (ref 3.87–5.11)
RDW: 16.5 % — ABNORMAL HIGH (ref 11.5–15.5)
WBC Count: 4.9 10*3/uL (ref 4.0–10.5)
nRBC: 0 % (ref 0.0–0.2)

## 2021-06-22 LAB — TSH: TSH: 0.444 u[IU]/mL (ref 0.308–3.960)

## 2021-06-22 LAB — CMP (CANCER CENTER ONLY)
ALT: 15 U/L (ref 0–44)
AST: 14 U/L — ABNORMAL LOW (ref 15–41)
Albumin: 3.9 g/dL (ref 3.5–5.0)
Alkaline Phosphatase: 74 U/L (ref 38–126)
Anion gap: 9 (ref 5–15)
BUN: 13 mg/dL (ref 8–23)
CO2: 27 mmol/L (ref 22–32)
Calcium: 9.6 mg/dL (ref 8.9–10.3)
Chloride: 106 mmol/L (ref 98–111)
Creatinine: 0.65 mg/dL (ref 0.44–1.00)
GFR, Estimated: >60 mL/min (ref >60)
Glucose, Bld: 91 mg/dL (ref 70–99)
Potassium: 3.8 mmol/L (ref 3.5–5.1)
Sodium: 142 mmol/L (ref 135–145)
Total Bilirubin: 0.3 mg/dL (ref 0.3–1.2)
Total Protein: 7.3 g/dL (ref 6.5–8.1)

## 2021-06-22 MED ORDER — SODIUM CHLORIDE 0.9 % IV SOLN
480.0000 mg | Freq: Once | INTRAVENOUS | Status: AC
  Administered 2021-06-22: 480 mg via INTRAVENOUS
  Filled 2021-06-22: qty 48

## 2021-06-22 MED ORDER — SODIUM CHLORIDE 0.9 % IV SOLN: Freq: Once | INTRAVENOUS | Status: AC

## 2021-06-22 NOTE — Patient Instructions (Signed)
[facility]COL[facility]   ?Discharge Instructions: ?Thank you for choosing [facility] to provide your oncology and hematology care.  ? ?If you have a lab appointment with the Cancer Center, please go directly to the Cancer Center and check in at the registration area. ?  ?Wear comfortable clothing and clothing appropriate for easy access to any Portacath or PICC line.  ? ?We strive to give you quality time with your provider. You may need to reschedule your appointment if you arrive late (15 or more minutes).  Arriving late affects you and other patients whose appointments are after yours.  Also, if you miss three or more appointments without notifying the office, you may be dismissed from the clinic at the provider?s discretion.    ?  ?For prescription refill requests, have your pharmacy contact our office and allow 72 hours for refills to be completed.   ? ?Today you received the following chemotherapy and/or immunotherapy agents: Nivolumab (Opdivo)    ?  ?To help prevent nausea and vomiting after your treatment, we encourage you to take your nausea medication as directed. ? ?BELOW ARE SYMPTOMS THAT SHOULD BE REPORTED IMMEDIATELY: ?*FEVER GREATER THAN 100.4 F (38 ?C) OR HIGHER ?*CHILLS OR SWEATING ?*NAUSEA AND VOMITING THAT IS NOT CONTROLLED WITH YOUR NAUSEA MEDICATION ?*UNUSUAL SHORTNESS OF BREATH ?*UNUSUAL BRUISING OR BLEEDING ?*URINARY PROBLEMS (pain or burning when urinating, or frequent urination) ?*BOWEL PROBLEMS (unusual diarrhea, constipation, pain near the anus) ?TENDERNESS IN MOUTH AND THROAT WITH OR WITHOUT PRESENCE OF ULCERS (sore throat, sores in mouth, or a toothache) ?UNUSUAL RASH, SWELLING OR PAIN  ?UNUSUAL VAGINAL DISCHARGE OR ITCHING  ? ?Items with * indicate a potential emergency and should be followed up as soon as possible or go to the Emergency Department if any problems should occur. ? ?Please show the CHEMOTHERAPY ALERT CARD or IMMUNOTHERAPY ALERT CARD at  check-in to the Emergency Department and triage nurse. ? ?Should you have questions after your visit or need to cancel or reschedule your appointment, please contact [facility]  Dept: [phone]  and follow the prompts.  Office hours are 8:00 a.m. to 4:30 p.m. Monday - Friday. Please note that voicemails left after 4:00 p.m. may not be returned until the following business day.  We are closed weekends and major holidays. You have access to a nurse at all times for urgent questions. Please call the main number to the clinic Dept: [phone] and follow the prompts. ? ? ?For any non-urgent questions, you may also contact your provider using MyChart. We now offer e-Visits for anyone 18 and older to request care online for non-urgent symptoms. For details visit [email or web address]. ?  ?Also download the MyChart app! Go to the app store, search "MyChart", open the app, select [facility], and log in with your MyChart username and password. ? ?Due to Covid, a mask is required upon entering the hospital/clinic. If you do not have a mask, one will be given to you upon arrival. For doctor visits, patients may have 1 support person aged 18 or older with them. For treatment visits, patients cannot have anyone with them due to current Covid guidelines and our immunocompromised population.  ? ?

## 2021-06-22 NOTE — Progress Notes (Signed)
Hematology and Oncology Follow Up  Sharon Kirk [id number] 11-21-1960 60 y.o. 06/22/2021 1:03 PM [name], MD       Principle Diagnosis: 60 year old woman with melanoma of the lower extremity diagnosed in 2017.  She developed stage IV with abdominal adenopathy in May 2022 after initial presentation with T2BN1 disease.  Secondary diagnoses:    1. Superficial spreading melanoma of the lower extremity diagnosed in August 2017. She was found to have 1.25 mm thickness without ulceration or lymphovascular invasion. She had 1 out of 2 lymph nodes positive. The size of the lymph node measuring 0.25 x 0.95 mm with pathological staging T2bN1.    2. Superficial spreading melanoma of the right shoulder diagnosed in August 2017.She is status post wide local excision and found tohave a 0.72 mm. These findings indicate a pathological staging of T1 NA.     Prior Therapy: She is status post local excision followed by sentinel lymph node sampling with 2 lymph nodes sampled one was involved with metastatic melanoma. The measurement was 0.25 x 0.95 mm in dimension of the capsule. Her final pathological stage stage is T2bN1. This was completed on 05/23/2016.    Nivolumab total of 240 mg dose every 2 weeks for total of 4 doses. Started on 07/21/2016.  She completed 4 cycles of therapy for a total of 2 months out of planned 12.  Patient opted out continuing immunotherapy.  She is status post lymph node biopsy completed on Feb 15, 2021.  Ipilimumab 3 mg/kg with a nivolumab at 1 mg/kg started on Mar 04, 2021.  She is status post 4 cycles of therapy.   Current therapy:   Nivolumab 480 mg every 4 weeks started on May 27, 2021.  She is here for the next cycle of therapy.   Interim History: Sharon Kirk returns today for repeat evaluation.  Since the last visit, she reports no major changes in her health.  She denies any recent hospitalizations or illnesses.  She denies any complications  related to nivolumab.  She denies any skin rash or GI complaints.  She denies any excessive fatigue or tiredness.    Medications: Updated on review. Current Outpatient Medications  Medication Sig Dispense Refill   albuterol (VENTOLIN HFA) 108 (90 Base) MCG/ACT inhaler Inhale 1 puff into the lungs every 4 (four) hours as needed for wheezing or shortness of breath. 1 each 2   apixaban (ELIQUIS) 5 MG TABS tablet Take 1 tablet (5 mg total) by mouth 2 (two) times daily. 60 tablet 2   calcium carbonate (OS-CAL) 600 MG TABS Take 600 mg by mouth 2 (two) times daily with a meal.     cholecalciferol (VITAMIN D) 1000 UNITS tablet Take 5,000 Units by mouth daily.     Cyanocobalamin (VITAMIN B-12 CR PO) Take 1 tablet by mouth daily.     HYDROcodone-acetaminophen (NORCO) 5-325 MG tablet Take 1 tablet by mouth every 6 (six) hours as needed for moderate pain. 30 tablet 0   LORazepam (ATIVAN) 1 MG tablet Take one tablet one hour before MRI (Patient not taking: Reported on 05/04/2021) 30 tablet 0   prochlorperazine (COMPAZINE) 10 MG tablet Take 1 tablet (10 mg total) by mouth every 6 (six) hours as needed for nausea or vomiting. 30 tablet 0   venlafaxine XR (EFFEXOR XR) 37.5 MG 24 hr capsule Take 1 capsule (37.5 mg total) by mouth daily with breakfast. 90 capsule 0   No current facility-administered medications for this visit.   Facility-Administered  Medications Ordered in Other Visits  Medication Dose Route Frequency Provider Last Rate Last Admin   0.9 %  sodium chloride infusion   Intravenous Once Jalayia Bagheri, Mathis Dad, MD       nivolumab (OPDIVO) 480 mg in sodium chloride 0.9 % 100 mL chemo infusion  480 mg Intravenous Once Wyatt Portela, MD         Allergies:  Allergies  Allergen Reactions   Codeine Nausea Only and Other (See Comments)    Hallucinations; confusion   Physical exam   Blood pressure 106/66, pulse 71, temperature 98.3 F (36.8 C), temperature source Oral, resp. rate 17, height 5' 5.5"  (1.664 m), weight 138 lb 3.2 oz (62.7 kg), last menstrual period 06/30/1990, SpO2 100 %.   ECOG 0   General appearance: Alert, awake without any distress. Head: Atraumatic without abnormalities Oropharynx: Without any thrush or ulcers. Eyes: No scleral icterus. Lymph nodes: No lymphadenopathy noted in the cervical, supraclavicular, or axillary nodes Heart:regular rate and rhythm, without any murmurs or gallops.   Lung: Clear to auscultation without any rhonchi, wheezes or dullness to percussion. Abdomin: Soft, nontender without any shifting dullness or ascites. Musculoskeletal: No clubbing or cyanosis. Neurological: No motor or sensory deficits. Skin: No rashes or lesions.         Lab Results: Lab Results  Component Value Date   WBC 4.9 06/22/2021   HGB 13.6 06/22/2021   HCT 41.3 06/22/2021   MCV 81.9 06/22/2021   PLT 220 06/22/2021     Chemistry      Component Value Date/Time   NA 142 06/22/2021 1206   NA 140 09/08/2016 0755   K 3.8 06/22/2021 1206   K 3.9 09/08/2016 0755   CL 106 06/22/2021 1206   CO2 27 06/22/2021 1206   CO2 23 09/08/2016 0755   BUN 13 06/22/2021 1206   BUN 11.2 09/08/2016 0755   CREATININE 0.65 06/22/2021 1206   CREATININE 0.61 09/30/2019 0839   CREATININE 0.7 09/08/2016 0755      Component Value Date/Time   CALCIUM 9.6 06/22/2021 1206   CALCIUM 9.1 09/08/2016 0755   ALKPHOS 74 06/22/2021 1206   ALKPHOS 61 09/08/2016 0755   AST 14 (L) 06/22/2021 1206   AST 13 09/08/2016 0755   ALT 15 06/22/2021 1206   ALT 16 09/08/2016 0755   BILITOT 0.3 06/22/2021 1206   BILITOT 0.42 09/08/2016 0755        Impression and Plan:   60 year old woman with:  1.  Melanoma lower extremity diagnosed in 2017.  She developed stage IV disease with pelvic adenopathy.  She had an excellent response to immunotherapy at this time and currently on nivolumab maintenance.  Risks and benefits of continuing this treatment were discussed.  Complications that  include autoimmune issues, GI toxicity and dermatological issues were reviewed.  The plan is to repeat imaging studies tentatively in December 2022.  The duration of therapy would be till disease progression or if she achieves a complete response then discontinuing immunotherapy after a period of maintenance would be considered.  2.  IV access: No issues with peripheral veins at this time.  Port-A-Cath option has been deferred.  3.  Antiemetics: No nausea or vomiting reported at this time.  Compazine is available to her currently.   4.  Autoimmune complications: I continue to educate her about potential issues including pneumonitis, colitis and thyroid disease.   5.  CNS staging: No signs or symptoms for CNS metastasis.  Imaging studies will be  updated in 2023.   6.  Back pain and abdominal pain: Resolved at this time due to treatment of her malignancy.   7.  Treatment goal and prognosis: Therapy remains palliative although aggressive measures are warranted given her excellent response.    8.  Follow-up: She will return in 1 month for the next cycle of therapy.     30  minutes were spent on this encounter.  The time was dedicated to reviewing laboratory data, disease status update, treatment choices and future plan of care discussion.   Zola Button, MD 06/22/2021 1:03 PM

## 2021-07-20 ENCOUNTER — Inpatient Hospital Stay (HOSPITAL_BASED_OUTPATIENT_CLINIC_OR_DEPARTMENT_OTHER): Payer: BC Managed Care – PPO | Admitting: Oncology

## 2021-07-20 ENCOUNTER — Other Ambulatory Visit: Payer: Self-pay

## 2021-07-20 ENCOUNTER — Inpatient Hospital Stay: Payer: BC Managed Care – PPO

## 2021-07-20 ENCOUNTER — Inpatient Hospital Stay: Payer: BC Managed Care – PPO | Attending: Oncology

## 2021-07-20 VITALS — BP 95/66 | HR 63 | Temp 97.9°F | Resp 16 | Ht 65.5 in | Wt 140.4 lb

## 2021-07-20 DIAGNOSIS — C439 Malignant melanoma of skin, unspecified: Secondary | ICD-10-CM

## 2021-07-20 DIAGNOSIS — C4372 Malignant melanoma of left lower limb, including hip: Secondary | ICD-10-CM | POA: Insufficient documentation

## 2021-07-20 DIAGNOSIS — Z5112 Encounter for antineoplastic immunotherapy: Secondary | ICD-10-CM | POA: Diagnosis not present

## 2021-07-20 DIAGNOSIS — Z79899 Other long term (current) drug therapy: Secondary | ICD-10-CM | POA: Insufficient documentation

## 2021-07-20 LAB — CBC WITH DIFFERENTIAL (CANCER CENTER ONLY)
Abs Immature Granulocytes: 0.01 10*3/uL (ref 0.00–0.07)
Basophils Absolute: 0 10*3/uL (ref 0.0–0.1)
Basophils Relative: 1 %
Eosinophils Absolute: 0.1 10*3/uL (ref 0.0–0.5)
Eosinophils Relative: 3 %
HCT: 41.7 % (ref 36.0–46.0)
Hemoglobin: 13.5 g/dL (ref 12.0–15.0)
Immature Granulocytes: 0 %
Lymphocytes Relative: 35 %
Lymphs Abs: 1.7 10*3/uL (ref 0.7–4.0)
MCH: 27.2 pg (ref 26.0–34.0)
MCHC: 32.4 g/dL (ref 30.0–36.0)
MCV: 84.1 fL (ref 80.0–100.0)
Monocytes Absolute: 0.4 10*3/uL (ref 0.1–1.0)
Monocytes Relative: 7 %
Neutro Abs: 2.6 10*3/uL (ref 1.7–7.7)
Neutrophils Relative %: 54 %
Platelet Count: 219 10*3/uL (ref 150–400)
RBC: 4.96 MIL/uL (ref 3.87–5.11)
RDW: 15.6 % — ABNORMAL HIGH (ref 11.5–15.5)
WBC Count: 4.8 10*3/uL (ref 4.0–10.5)
nRBC: 0 % (ref 0.0–0.2)

## 2021-07-20 LAB — CMP (CANCER CENTER ONLY)
ALT: 14 U/L (ref 0–44)
AST: 13 U/L — ABNORMAL LOW (ref 15–41)
Albumin: 4 g/dL (ref 3.5–5.0)
Alkaline Phosphatase: 80 U/L (ref 38–126)
Anion gap: 12 (ref 5–15)
BUN: 9 mg/dL (ref 8–23)
CO2: 28 mmol/L (ref 22–32)
Calcium: 9.6 mg/dL (ref 8.9–10.3)
Chloride: 104 mmol/L (ref 98–111)
Creatinine: 0.65 mg/dL (ref 0.44–1.00)
GFR, Estimated: >60 mL/min (ref >60)
Glucose, Bld: 81 mg/dL (ref 70–99)
Potassium: 3.6 mmol/L (ref 3.5–5.1)
Sodium: 144 mmol/L (ref 135–145)
Total Bilirubin: <0.2 mg/dL — ABNORMAL LOW (ref 0.3–1.2)
Total Protein: 7.6 g/dL (ref 6.5–8.1)

## 2021-07-20 LAB — TSH: TSH: 0.814 u[IU]/mL (ref 0.308–3.960)

## 2021-07-20 MED ORDER — SODIUM CHLORIDE 0.9 % IV SOLN: Freq: Once | INTRAVENOUS | Status: AC

## 2021-07-20 MED ORDER — SODIUM CHLORIDE 0.9 % IV SOLN
480.0000 mg | Freq: Once | INTRAVENOUS | Status: AC
  Administered 2021-07-20: 480 mg via INTRAVENOUS
  Filled 2021-07-20: qty 48

## 2021-07-20 NOTE — Progress Notes (Signed)
Hematology and Oncology Follow Up  Sharon Kirk [id number] 1961/04/26 60 y.o. 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  She presented with T2BN1 in 2017 involving the lower extremity.  Secondary diagnoses:    1. Superficial spreading melanoma of the lower extremity diagnosed in August 2017. She was found to have 1.25 mm thickness without ulceration or lymphovascular invasion. She had 1 out of 2 lymph nodes positive. The size of the lymph node measuring 0.25 x 0.95 mm with pathological staging T2bN1.    2. Superficial spreading melanoma of the right shoulder diagnosed in August 2017.She is status post wide local excision and found tohave a 0.72 mm. These findings indicate a pathological staging of T1 NA.     Prior Therapy: She is status post local excision followed by sentinel lymph node sampling with 2 lymph nodes sampled one was involved with metastatic melanoma. The measurement was 0.25 x 0.95 mm in dimension of the capsule. Her final pathological stage stage is T2bN1. This was completed on 05/23/2016.    Nivolumab total of 240 mg dose every 2 weeks for total of 4 doses. Started on 07/21/2016.  She completed 4 cycles of therapy for a total of 2 months out of planned 12.  Patient opted out continuing immunotherapy.  She is status post lymph node biopsy completed on Feb 15, 2021.  Ipilimumab 3 mg/kg with a nivolumab at 1 mg/kg started on Mar 04, 2021.  She is status post 4 cycles of therapy.   Current therapy:   Nivolumab 480 mg every 4 weeks started on May 27, 2021.  She presents today for repeat evaluation and the next cycle of therapy.   Interim History: Sharon Kirk presents today for a follow-up visit.  Since the last visit, she reports no major changes in her health.  She has noticed some slight increased pain in her hip and  thigh on the left side for the last week.  This was more noticeable at nighttime and resolves during the day.  Not interfering with physical activity at this time.  She has been more active and exercising regularly [place].  She denies any recent hospitalizations or illnesses.  She denies any fevers chills or sweats.  Pulm status quality of life remained excellent.    Medications: Reviewed without changes. Current Outpatient Medications  Medication Sig Dispense Refill   albuterol (VENTOLIN HFA) 108 (90 Base) MCG/ACT inhaler Inhale 1 puff into the lungs every 4 (four) hours as needed for wheezing or shortness of breath. 1 each 2   apixaban (ELIQUIS) 5 MG TABS tablet Take 1 tablet (5 mg total) by mouth 2 (two) times daily. 60 tablet 2   calcium carbonate (OS-CAL) 600 MG TABS Take 600 mg by mouth 2 (two) times daily with a meal.     cholecalciferol (VITAMIN D) 1000 UNITS tablet Take 5,000 Units by mouth daily.     Cyanocobalamin (VITAMIN B-12 CR PO) Take 1 tablet by mouth daily.     HYDROcodone-acetaminophen (NORCO) 5-325 MG tablet Take 1 tablet by mouth every 6 (six) hours as needed for moderate pain. 30 tablet 0   LORazepam (ATIVAN) 1 MG tablet Take one tablet one hour before MRI (Patient not taking: Reported on 05/04/2021) 30 tablet 0   prochlorperazine (COMPAZINE) 10 MG tablet Take 1 tablet (10 mg total) by mouth every 6 (six) hours as  needed for nausea or vomiting. 30 tablet 0   venlafaxine XR (EFFEXOR XR) 37.5 MG 24 hr capsule Take 1 capsule (37.5 mg total) by mouth daily with breakfast. 90 capsule 0   No current facility-administered medications for this visit.     Allergies:  Allergies  Allergen Reactions   Codeine Nausea Only and Other (See Comments)    Hallucinations; confusion   Physical exam    Blood pressure 95/66, pulse 63, temperature 97.9 F (36.6 C), temperature source Oral, resp. rate 16, height 5' 5.5" (1.664 m), weight 140 lb 6.4 oz (63.7 kg), last menstrual period  06/30/1990, SpO2 100 %.   ECOG 0   General appearance: Comfortable appearing without any discomfort Head: Normocephalic without any trauma Oropharynx: Mucous membranes are moist and pink without any thrush or ulcers. Eyes: Pupils are equal and round reactive to light. Lymph nodes: No cervical, supraclavicular, inguinal or axillary lymphadenopathy.   Heart:regular rate and rhythm.  S1 and S2 without leg edema. Lung: Clear without any rhonchi or wheezes.  No dullness to percussion. Abdomin: Soft, nontender, nondistended with good bowel sounds.  No hepatosplenomegaly. Musculoskeletal: Full range of motion noted on her exam of the left hip.  No point tenderness. Neurological: No deficits noted on motor, sensory and deep tendon reflex exam. Skin: No petechial rash or dryness.  Appeared moist.           Lab Results: Lab Results  Component Value Date   WBC 4.9 06/22/2021   HGB 13.6 06/22/2021   HCT 41.3 06/22/2021   MCV 81.9 06/22/2021   PLT 220 06/22/2021     Chemistry      Component Value Date/Time   NA 142 06/22/2021 1206   NA 140 09/08/2016 0755   K 3.8 06/22/2021 1206   K 3.9 09/08/2016 0755   CL 106 06/22/2021 1206   CO2 27 06/22/2021 1206   CO2 23 09/08/2016 0755   BUN 13 06/22/2021 1206   BUN 11.2 09/08/2016 0755   CREATININE 0.65 06/22/2021 1206   CREATININE 0.61 09/30/2019 0839   CREATININE 0.7 09/08/2016 0755      Component Value Date/Time   CALCIUM 9.6 06/22/2021 1206   CALCIUM 9.1 09/08/2016 0755   ALKPHOS 74 06/22/2021 1206   ALKPHOS 61 09/08/2016 0755   AST 14 (L) 06/22/2021 1206   AST 13 09/08/2016 0755   ALT 15 06/22/2021 1206   ALT 16 09/08/2016 0755   BILITOT 0.3 06/22/2021 1206   BILITOT 0.42 09/08/2016 0755        Impression and Plan:   60 year old woman with:  1.  Stage IV melanoma involving pelvic adenopathy diagnosed in 2022 after initially diagnosed with localized disease in 2017.  Her disease status was updated at this time  and treatment choices were discussed.  She has experienced excellent clinical benefit to immunotherapy and currently on nivolumab.  Risks and benefits of continuing this treatment for at least the next year were discussed.  Complications that include immune mediated issues, GI toxicity and dermatitis were reviewed.  At this time she is agreeable to continue and we will update imaging studies in December.    2.  IV access: Peripheral veins are currently in use without any need for Port-A-Cath.  3.  Antiemetics: Compazine is available to her without any nausea or vomiting.   4.  Autoimmune complications: I continue to educate her about potential complications including pneumonitis, colitis and thyroid disease.   5.  CNS staging: No signs of symptoms or  any brain metastasis.  This will be updated in 2023.  6.  Hip pain/thigh pain: Physical exam is unrevealing with excellent range of motion in her hip and knee.  This appears to be more muscular in nature and will continue to monitor at this time.  I doubt this is an autoimmune malignancy related.  7.  Treatment goal and prognosis: Aggressive measures are warranted given her excellent performance status.  Her disease might not be curable but she is responding well.  8.  Follow-up: In 4 weeks for repeat follow-up.     30  minutes were dedicated to this visit.  The time spent on reviewing laboratory data, disease status update, treatment choices and future plan of care review.   Zola Button, MD 07/20/2021 12:07 PM

## 2021-07-20 NOTE — Patient Instructions (Signed)
[facility]ICAL [facility]COLOG[facility]  ? Discharge Instructions: ?Thank you for choosing [facility] to provide your oncology and hematology care.  ? ?If you have a lab appointment with the Cancer Center, please go directly to the Cancer Center and check in at the registration area. ?  ?Wear comfortable clothing and clothing appropriate for easy access to any Portacath or PICC line.  ? ?We strive to give you quality time with your provider. You may need to reschedule your appointment if you arrive late (15 or more minutes).  Arriving late affects you and other patients whose appointments are after yours.  Also, if you miss three or more appointments without notifying the office, you may be dismissed from the clinic at the provider?s discretion.    ?  ?For prescription refill requests, have your pharmacy contact our office and allow 72 hours for refills to be completed.   ? ?Today you received the following chemotherapy and/or immunotherapy agents: nivolumab    ?  ?To help prevent nausea and vomiting after your treatment, we encourage you to take your nausea medication as directed. ? ?BELOW ARE SYMPTOMS THAT SHOULD BE REPORTED IMMEDIATELY: ?*FEVER GREATER THAN 100.4 F (38 ?C) OR HIGHER ?*CHILLS OR SWEATING ?*NAUSEA AND VOMITING THAT IS NOT CONTROLLED WITH YOUR NAUSEA MEDICATION ?*UNUSUAL SHORTNESS OF BREATH ?*UNUSUAL BRUISING OR BLEEDING ?*URINARY PROBLEMS (pain or burning when urinating, or frequent urination) ?*BOWEL PROBLEMS (unusual diarrhea, constipation, pain near the anus) ?TENDERNESS IN MOUTH AND THROAT WITH OR WITHOUT PRESENCE OF ULCERS (sore throat, sores in mouth, or a toothache) ?UNUSUAL RASH, SWELLING OR PAIN  ?UNUSUAL VAGINAL DISCHARGE OR ITCHING  ? ?Items with * indicate a potential emergency and should be followed up as soon as possible or go to the Emergency Department if any problems should occur. ? ?Please show the CHEMOTHERAPY ALERT CARD or IMMUNOTHERAPY ALERT CARD at check-in  to the Emergency Department and triage nurse. ? ?Should you have questions after your visit or need to cancel or reschedule your appointment, please contact [facility]  Dept: [phone]  and follow the prompts.  Office hours are 8:00 a.m. to 4:30 p.m. Monday - Friday. Please note that voicemails left after 4:00 p.m. may not be returned until the following business day.  We are closed weekends and major holidays. You have access to a nurse at all times for urgent questions. Please call the main number to the clinic Dept: [phone] and follow the prompts. ? ? ?For any non-urgent questions, you may also contact your provider using MyChart. We now offer e-Visits for anyone 18 and older to request care online for non-urgent symptoms. For details visit [email or web address]. ?  ?Also download the MyChart app! Go to the app store, search "MyChart", open the app, select [facility], and log in with your MyChart username and password. ? ?Due to Covid, a mask is required upon entering the hospital/clinic. If you do not have a mask, one will be given to you upon arrival. For doctor visits, patients may have 1 support person aged 18 or older with them. For treatment visits, patients cannot have anyone with them due to current Covid guidelines and our immunocompromised population.  ? ?

## 2021-07-29 DIAGNOSIS — L821 Other seborrheic keratosis: Secondary | ICD-10-CM | POA: Diagnosis not present

## 2021-07-29 DIAGNOSIS — Z85820 Personal history of malignant melanoma of skin: Secondary | ICD-10-CM | POA: Diagnosis not present

## 2021-07-29 DIAGNOSIS — D2272 Melanocytic nevi of left lower limb, including hip: Secondary | ICD-10-CM | POA: Diagnosis not present

## 2021-07-29 DIAGNOSIS — D485 Neoplasm of uncertain behavior of skin: Secondary | ICD-10-CM | POA: Diagnosis not present

## 2021-07-29 DIAGNOSIS — D2261 Melanocytic nevi of right upper limb, including shoulder: Secondary | ICD-10-CM | POA: Diagnosis not present

## 2021-08-17 ENCOUNTER — Inpatient Hospital Stay: Payer: BC Managed Care – PPO | Attending: Oncology

## 2021-08-17 ENCOUNTER — Inpatient Hospital Stay: Payer: BC Managed Care – PPO

## 2021-08-17 ENCOUNTER — Other Ambulatory Visit: Payer: Self-pay

## 2021-08-17 ENCOUNTER — Inpatient Hospital Stay (HOSPITAL_BASED_OUTPATIENT_CLINIC_OR_DEPARTMENT_OTHER): Payer: BC Managed Care – PPO | Admitting: Oncology

## 2021-08-17 VITALS — BP 106/67 | HR 70 | Temp 97.3°F | Resp 18 | Wt 142.0 lb

## 2021-08-17 DIAGNOSIS — C4370 Malignant melanoma of unspecified lower limb, including hip: Secondary | ICD-10-CM | POA: Insufficient documentation

## 2021-08-17 DIAGNOSIS — Z5112 Encounter for antineoplastic immunotherapy: Secondary | ICD-10-CM | POA: Insufficient documentation

## 2021-08-17 DIAGNOSIS — C439 Malignant melanoma of skin, unspecified: Secondary | ICD-10-CM

## 2021-08-17 DIAGNOSIS — Z79899 Other long term (current) drug therapy: Secondary | ICD-10-CM | POA: Insufficient documentation

## 2021-08-17 DIAGNOSIS — C772 Secondary and unspecified malignant neoplasm of intra-abdominal lymph nodes: Secondary | ICD-10-CM | POA: Diagnosis not present

## 2021-08-17 LAB — CBC WITH DIFFERENTIAL (CANCER CENTER ONLY)
Abs Immature Granulocytes: 0 10*3/uL (ref 0.00–0.07)
Basophils Absolute: 0 10*3/uL (ref 0.0–0.1)
Basophils Relative: 1 %
Eosinophils Absolute: 0.1 10*3/uL (ref 0.0–0.5)
Eosinophils Relative: 3 %
HCT: 40.8 % (ref 36.0–46.0)
Hemoglobin: 13.5 g/dL (ref 12.0–15.0)
Immature Granulocytes: 0 %
Lymphocytes Relative: 41 %
Lymphs Abs: 1.8 10*3/uL (ref 0.7–4.0)
MCH: 27.4 pg (ref 26.0–34.0)
MCHC: 33.1 g/dL (ref 30.0–36.0)
MCV: 82.9 fL (ref 80.0–100.0)
Monocytes Absolute: 0.4 10*3/uL (ref 0.1–1.0)
Monocytes Relative: 9 %
Neutro Abs: 2 10*3/uL (ref 1.7–7.7)
Neutrophils Relative %: 46 %
Platelet Count: 226 10*3/uL (ref 150–400)
RBC: 4.92 MIL/uL (ref 3.87–5.11)
RDW: 14.7 % (ref 11.5–15.5)
WBC Count: 4.3 10*3/uL (ref 4.0–10.5)
nRBC: 0 % (ref 0.0–0.2)

## 2021-08-17 LAB — CMP (CANCER CENTER ONLY)
ALT: 17 U/L (ref 0–44)
AST: 15 U/L (ref 15–41)
Albumin: 4 g/dL (ref 3.5–5.0)
Alkaline Phosphatase: 76 U/L (ref 38–126)
Anion gap: 10 (ref 5–15)
BUN: 13 mg/dL (ref 8–23)
CO2: 27 mmol/L (ref 22–32)
Calcium: 9.4 mg/dL (ref 8.9–10.3)
Chloride: 105 mmol/L (ref 98–111)
Creatinine: 0.69 mg/dL (ref 0.44–1.00)
GFR, Estimated: >60 mL/min (ref >60)
Glucose, Bld: 82 mg/dL (ref 70–99)
Potassium: 3.9 mmol/L (ref 3.5–5.1)
Sodium: 142 mmol/L (ref 135–145)
Total Bilirubin: 0.4 mg/dL (ref 0.3–1.2)
Total Protein: 7.4 g/dL (ref 6.5–8.1)

## 2021-08-17 LAB — TSH: TSH: 0.732 u[IU]/mL (ref 0.308–3.960)

## 2021-08-17 MED ORDER — SODIUM CHLORIDE 0.9 % IV SOLN
480.0000 mg | Freq: Once | INTRAVENOUS | Status: AC
  Administered 2021-08-17: 480 mg via INTRAVENOUS
  Filled 2021-08-17: qty 48

## 2021-08-17 MED ORDER — SODIUM CHLORIDE 0.9 % IV SOLN: Freq: Once | INTRAVENOUS | Status: DC

## 2021-08-17 NOTE — Patient Instructions (Signed)
[facility]  ? Discharge Instructions: ?Thank you for choosing [facility] to provide your oncology and hematology care.  ? ?If you have a lab appointment with the Cancer Center, please go directly to the Cancer Center and check in at the registration area. ?  ?Wear comfortable clothing and clothing appropriate for easy access to any Portacath or PICC line.  ? ?We strive to give you quality time with your provider. You may need to reschedule your appointment if you arrive late (15 or more minutes).  Arriving late affects you and other patients whose appointments are after yours.  Also, if you miss three or more appointments without notifying the office, you may be dismissed from the clinic at the provider?s discretion.    ?  ?For prescription refill requests, have your pharmacy contact our office and allow 72 hours for refills to be completed.   ? ?Today you received the following chemotherapy and/or immunotherapy agents: nivolumab    ?  ?To help prevent nausea and vomiting after your treatment, we encourage you to take your nausea medication as directed. ? ?BELOW ARE SYMPTOMS THAT SHOULD BE REPORTED IMMEDIATELY: ?*FEVER GREATER THAN 100.4 F (38 ?C) OR HIGHER ?*CHILLS OR SWEATING ?*NAUSEA AND VOMITING THAT IS NOT CONTROLLED WITH YOUR NAUSEA MEDICATION ?*UNUSUAL SHORTNESS OF BREATH ?*UNUSUAL BRUISING OR BLEEDING ?*URINARY PROBLEMS (pain or burning when urinating, or frequent urination) ?*BOWEL PROBLEMS (unusual diarrhea, constipation, pain near the anus) ?TENDERNESS IN MOUTH AND THROAT WITH OR WITHOUT PRESENCE OF ULCERS (sore throat, sores in mouth, or a toothache) ?UNUSUAL RASH, SWELLING OR PAIN  ?UNUSUAL VAGINAL DISCHARGE OR ITCHING  ? ?Items with * indicate a potential emergency and should be followed up as soon as possible or go to the Emergency Department if any problems should occur. ? ?Please show the CHEMOTHERAPY ALERT CARD or IMMUNOTHERAPY ALERT CARD at check-in  to the Emergency Department and triage nurse. ? ?Should you have questions after your visit or need to cancel or reschedule your appointment, please contact [facility]  Dept: [phone]  and follow the prompts.  Office hours are 8:00 a.m. to 4:30 p.m. Monday - Friday. Please note that voicemails left after 4:00 p.m. may not be returned until the following business day.  We are closed weekends and major holidays. You have access to a nurse at all times for urgent questions. Please call the main number to the clinic Dept: [phone] and follow the prompts. ? ? ?For any non-urgent questions, you may also contact your provider using MyChart. We now offer e-Visits for anyone 18 and older to request care online for non-urgent symptoms. For details visit [email or web address]. ?  ?Also download the MyChart app! Go to the app store, search "MyChart", open the app, select [facility], and log in with your MyChart username and password. ? ?Due to Covid, a mask is required upon entering the hospital/clinic. If you do not have a mask, one will be given to you upon arrival. For doctor visits, patients may have 1 support person aged 18 or older with them. For treatment visits, patients cannot have anyone with them due to current Covid guidelines and our immunocompromised population.  ? ?

## 2021-08-17 NOTE — Progress Notes (Signed)
Hematology and Oncology Follow Up  Sharon Kirk [id number] 1961-09-11 60 y.o. 08/17/2021 12:16 PM Sharon Kirk, Sharon Brow, MD       Principle Diagnosis: 60 year old woman with melanoma of the lower extremity diagnosed in 2017 presented with T2BN1 superficial spreading subtype.  She developed stage IV disease with abdominal adenopathy diagnosed in May 2022.    Secondary diagnoses:    1. Superficial spreading melanoma of the lower extremity diagnosed in August 2017. She was found to have 1.25 mm thickness without ulceration or lymphovascular invasion. She had 1 out of 2 lymph nodes positive. The size of the lymph node measuring 0.25 x 0.95 mm with pathological staging T2bN1.    2. Superficial spreading melanoma of the right shoulder diagnosed in August 2017.She is status post wide local excision and found tohave a 0.72 mm. These findings indicate a pathological staging of T1 NA.     Prior Therapy: She is status post local excision followed by sentinel lymph node sampling with 2 lymph nodes sampled one was involved with metastatic melanoma. The measurement was 0.25 x 0.95 mm in dimension of the capsule. Her final pathological stage stage is T2bN1. This was completed on 05/23/2016.    Nivolumab total of 240 mg dose every 2 weeks for total of 4 doses. Started on 07/21/2016.  She completed 4 cycles of therapy for a total of 2 months out of planned 12.  Patient opted out continuing immunotherapy.  She is status post lymph node biopsy completed on Feb 15, 2021.  Ipilimumab 3 mg/kg with a nivolumab at 1 mg/kg started on Mar 04, 2021.  She is status post 4 cycles of therapy.   Current therapy:   Nivolumab 480 mg every 4 weeks started on May 27, 2021.  She returns for the next cycle of therapy.  Interim History: Sharon Kirk is here for return evaluation.  Since the last visit, she reports no major changes in her health.  She still has intermittent left-sided hip pain although has not  been more frequent or constant.  She reports her pain is exacerbated with prolonged sitting positions.  She denies any nausea, vomiting or fevers.  She denies any neurological deficits.  Performance status quality of life remained excellent.    Medications: Updated on review. Current Outpatient Medications  Medication Sig Dispense Refill   albuterol (VENTOLIN HFA) 108 (90 Base) MCG/ACT inhaler Inhale 1 puff into the lungs every 4 (four) hours as needed for wheezing or shortness of breath. 1 each 2   apixaban (ELIQUIS) 5 MG TABS tablet Take 1 tablet (5 mg total) by mouth 2 (two) times daily. 60 tablet 2   calcium carbonate (OS-CAL) 600 MG TABS Take 600 mg by mouth 2 (two) times daily with a meal.     cholecalciferol (VITAMIN D) 1000 UNITS tablet Take 5,000 Units by mouth daily.     Cyanocobalamin (VITAMIN B-12 CR PO) Take 1 tablet by mouth daily.     HYDROcodone-acetaminophen (NORCO) 5-325 MG tablet Take 1 tablet by mouth every 6 (six) hours as needed for moderate pain. 30 tablet 0   LORazepam (ATIVAN) 1 MG tablet Take one tablet one hour before MRI (Patient not taking: Reported on 05/04/2021) 30 tablet 0   prochlorperazine (COMPAZINE) 10 MG tablet Take 1 tablet (10 mg total) by mouth every 6 (six) hours as needed for nausea or vomiting. 30 tablet 0   venlafaxine XR (EFFEXOR XR) 37.5 MG 24 hr capsule Take 1 capsule (37.5 mg total) by mouth  daily with breakfast. 90 capsule 0   No current facility-administered medications for this visit.     Allergies:  Allergies  Allergen Reactions   Codeine Nausea Only and Other (See Comments)    Hallucinations; confusion   Physical exam    Blood pressure 106/67, pulse 70, temperature (!) 97.3 F (36.3 C), resp. rate 18, weight 142 lb (64.4 kg), last menstrual period 06/30/1990, SpO2 99 %.    ECOG 0    General appearance: Alert, awake without any distress. Head: Atraumatic without abnormalities Oropharynx: Without any thrush or ulcers. Eyes:  No scleral icterus. Lymph nodes: No lymphadenopathy noted in the cervical, supraclavicular, or axillary nodes Heart:regular rate and rhythm, without any murmurs or gallops.   Lung: Clear to auscultation without any rhonchi, wheezes or dullness to percussion. Abdomin: Soft, nontender without any shifting dullness or ascites. Musculoskeletal: No clubbing or cyanosis. Neurological: No motor or sensory deficits. Skin: No rashes or lesions.           Lab Results: Lab Results  Component Value Date   WBC 4.8 07/20/2021   HGB 13.5 07/20/2021   HCT 41.7 07/20/2021   MCV 84.1 07/20/2021   PLT 219 07/20/2021     Chemistry      Component Value Date/Time   NA 144 07/20/2021 1215   NA 140 09/08/2016 0755   K 3.6 07/20/2021 1215   K 3.9 09/08/2016 0755   CL 104 07/20/2021 1215   CO2 28 07/20/2021 1215   CO2 23 09/08/2016 0755   BUN 9 07/20/2021 1215   BUN 11.2 09/08/2016 0755   CREATININE 0.65 07/20/2021 1215   CREATININE 0.61 09/30/2019 0839   CREATININE 0.7 09/08/2016 0755      Component Value Date/Time   CALCIUM 9.6 07/20/2021 1215   CALCIUM 9.1 09/08/2016 0755   ALKPHOS 80 07/20/2021 1215   ALKPHOS 61 09/08/2016 0755   AST 13 (L) 07/20/2021 1215   AST 13 09/08/2016 0755   ALT 14 07/20/2021 1215   ALT 16 09/08/2016 0755   BILITOT <0.2 (L) 07/20/2021 1215   BILITOT 0.42 09/08/2016 0755        Impression and Plan:   60 year old woman with:  1.  Melanoma diagnosed in 2017.  She developed stage IV superficial spreading subtype with pelvic adenopathy in 2022.    She continues to have excellent tolerance and benefit from immunotherapy at this time.  Risks and benefits of continuing this treatment were discussed.  Complications including immune mediated issues, GI toxicity among others.  The plan is to update her staging scans in December 2022.  She is agreeable to proceed.  2.  IV access: No issues reported with peripheral veins at this time.  Port-A-Cath options  been deferred.  3.  Antiemetics: No nausea or vomiting reported currently.  Compazine is available to her.   4.  Autoimmune complications: She has not experienced any issues at this time.  These include pneumonitis, colitis and thyroid disease.   5.  CNS staging: She is up-to-date without any evidence of CNS metastasis.  We will update that next year.  6.  Hip pain/thigh pain: Remains mild and unclear etiology.  Radicular pain versus musculoskeletal etiology is under consideration.  We will update her scans in the near future to rule out malignancy.  7.  Treatment goal and prognosis: Therapy remains palliative although aggressive measures are warranted given her performance status.  8.  Follow-up: She will return next month for a follow-up visit.  30  minutes were dedicated to this encounter.  Time spent on reviewing laboratory data, disease status update and outlining future plan of care discussion.   Zola Button, MD 08/17/2021 12:16 PM

## 2021-09-13 ENCOUNTER — Encounter (HOSPITAL_COMMUNITY): Payer: Self-pay

## 2021-09-13 ENCOUNTER — Ambulatory Visit (HOSPITAL_COMMUNITY)
Admission: RE | Admit: 2021-09-13 | Discharge: 2021-09-13 | Disposition: A | Discharge status: Home / Self Care | Payer: BC Managed Care – PPO | Source: Ambulatory Visit | Attending: Oncology | Admitting: Oncology

## 2021-09-13 ENCOUNTER — Other Ambulatory Visit: Payer: Self-pay

## 2021-09-13 DIAGNOSIS — D7389 Other diseases of spleen: Secondary | ICD-10-CM | POA: Diagnosis not present

## 2021-09-13 DIAGNOSIS — I2510 Atherosclerotic heart disease of native coronary artery without angina pectoris: Secondary | ICD-10-CM | POA: Diagnosis not present

## 2021-09-13 DIAGNOSIS — J479 Bronchiectasis, uncomplicated: Secondary | ICD-10-CM | POA: Diagnosis not present

## 2021-09-13 DIAGNOSIS — K7689 Other specified diseases of liver: Secondary | ICD-10-CM | POA: Diagnosis not present

## 2021-09-13 DIAGNOSIS — K561 Intussusception: Secondary | ICD-10-CM | POA: Diagnosis not present

## 2021-09-13 DIAGNOSIS — C439 Malignant melanoma of skin, unspecified: Secondary | ICD-10-CM | POA: Diagnosis not present

## 2021-09-13 IMAGING — CT CT CHEST-ABD-PELV W/ CM
2 of 5 series · 13 of 36 positions shown, 15 images · IV contrast (omnipaque)
Comparison: [DATE].

CLINICAL DATA: Melanoma, immunotherapy in progress.

EXAM:
CT CHEST, ABDOMEN, AND PELVIS WITH CONTRAST
TECHNIQUE: Multidetector CT imaging of the chest, abdomen and pelvis was
performed following the standard protocol during bolus
administration of intravenous contrast.
CONTRAST:  75mL OMNIPAQUE IOHEXOL 350 MG/ML SOLN

[Series 2: cap with · axial · 0.68mm/px · z∈[-623,-83]mm · 10 of 132 slices shown, 12 images]
[im 12/132  mediastinal]
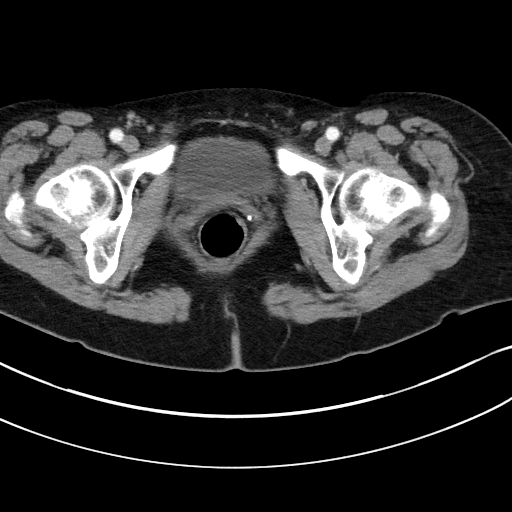
[im 12/132  bone]
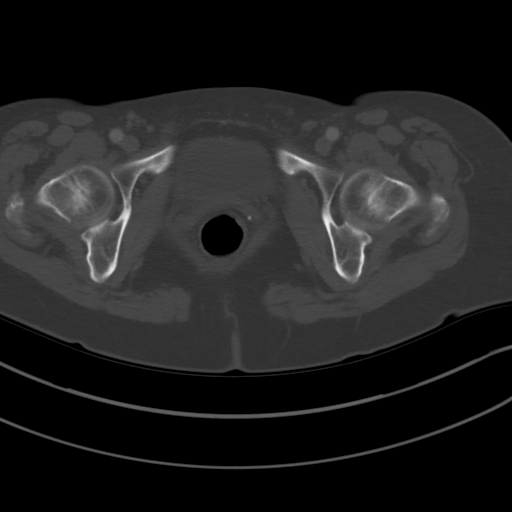
[im 24/132  mediastinal]
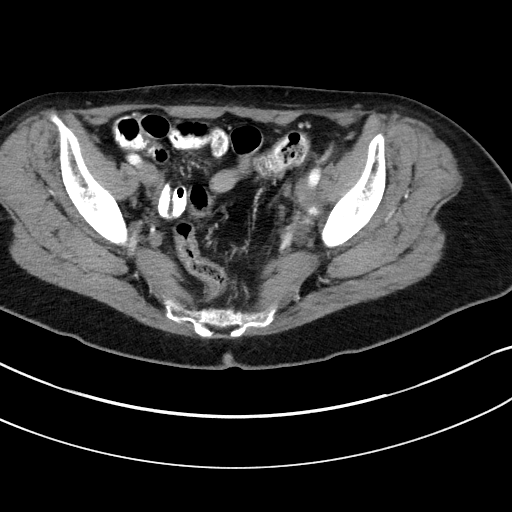
[im 36/132  mediastinal]
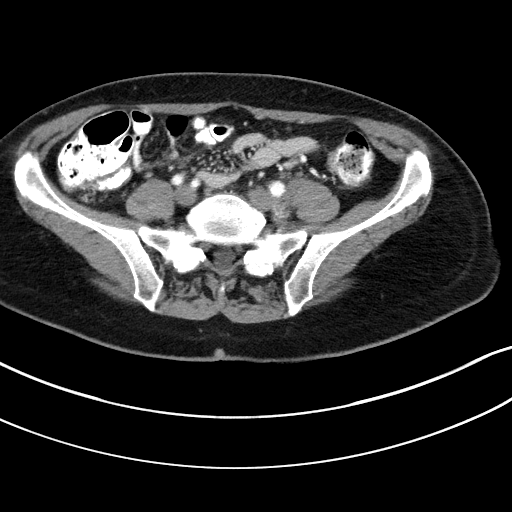
[im 48/132  mediastinal]
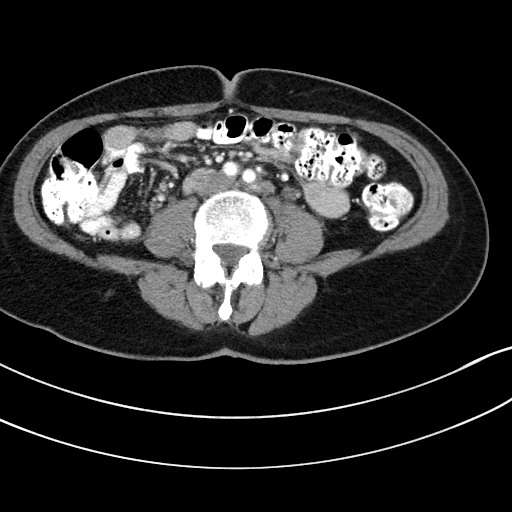
[im 60/132  mediastinal]
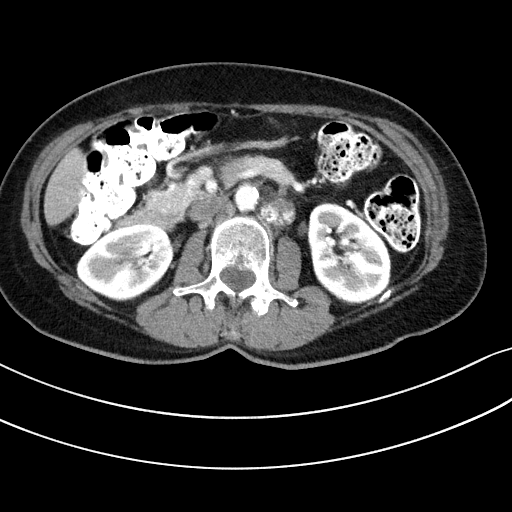
[im 72/132  mediastinal]
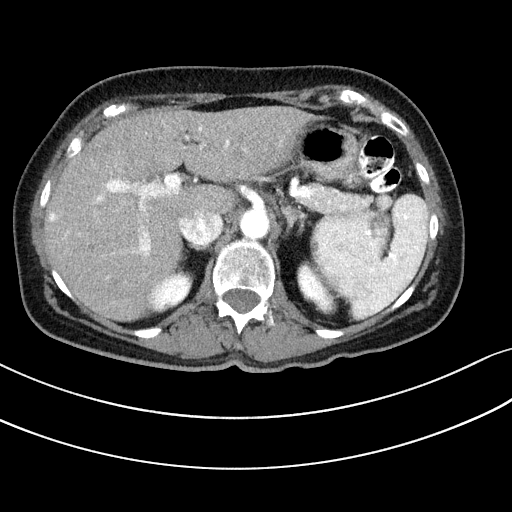
[im 84/132  mediastinal]
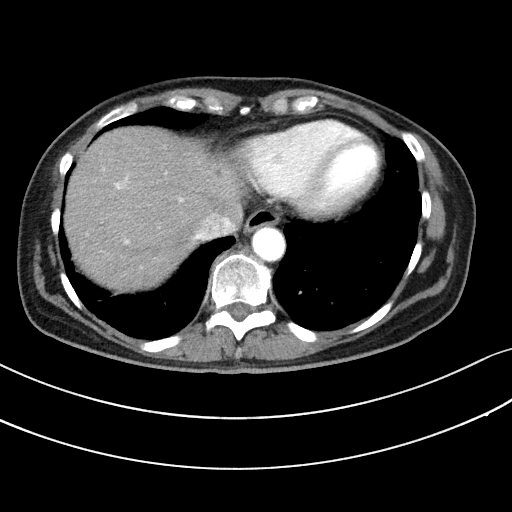
[im 96/132  mediastinal]
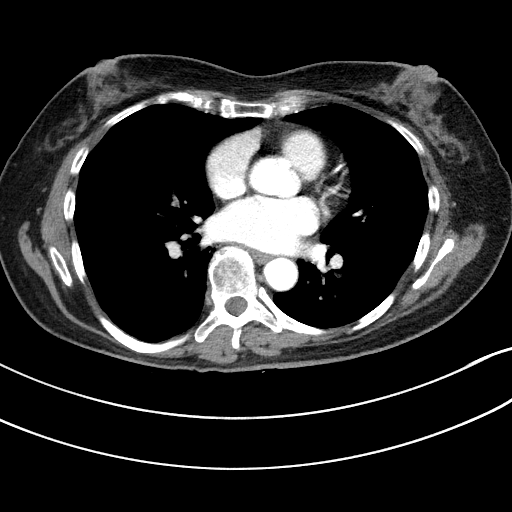
[im 108/132  mediastinal]
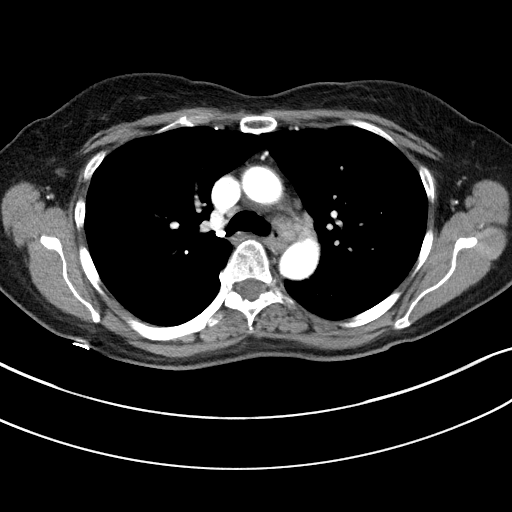
[im 108/132  bone]
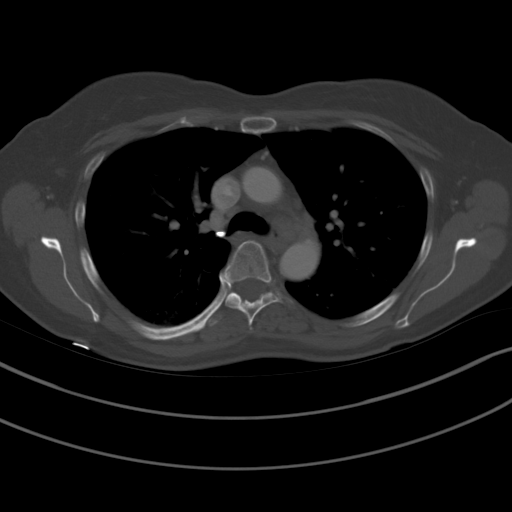
[im 120/132  mediastinal]
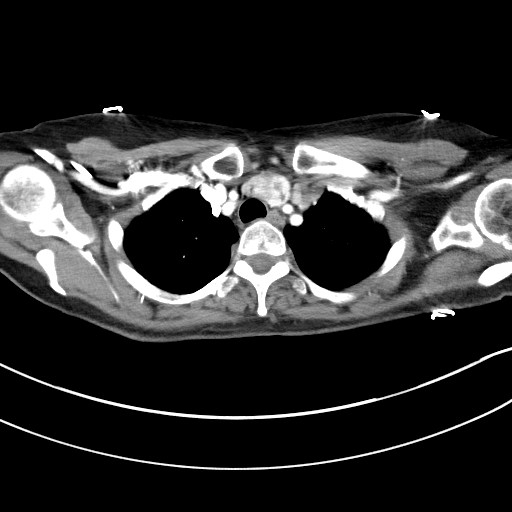

[Series 4: coronals · coronal · 0.70mm/px · 3 of 131 slices shown]
[im 27/131  mediastinal]
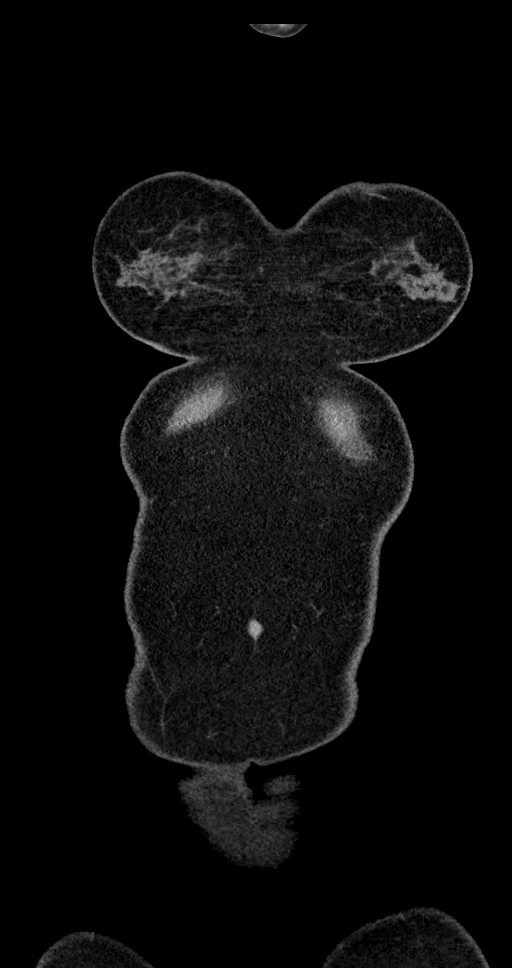
[im 53/131  mediastinal]
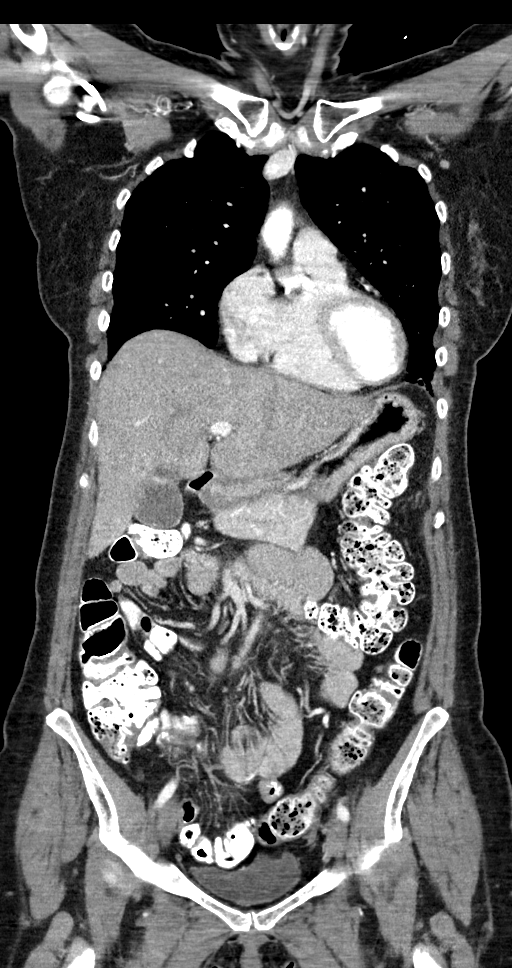
[im 79/131  mediastinal]
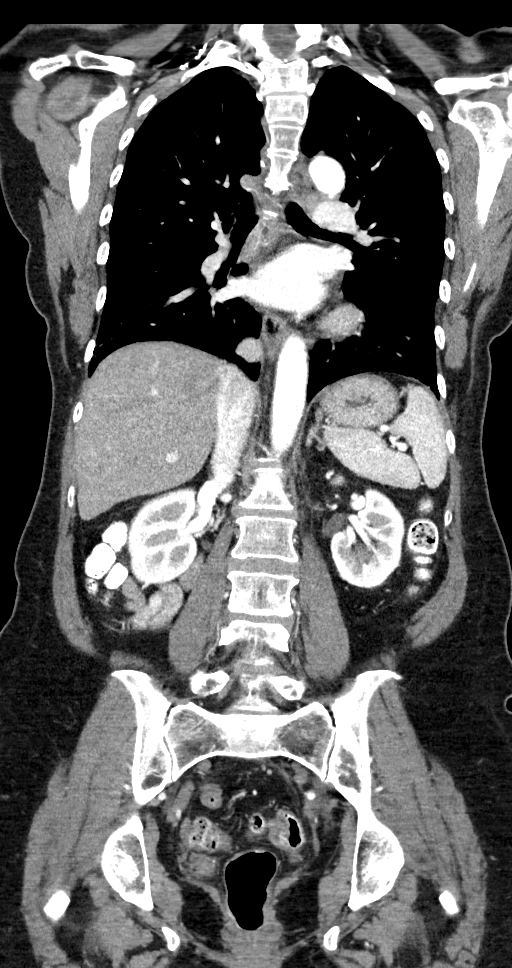

[13 of 36 positions shown; findings below may reference images not displayed]

FINDINGS: CT CHEST FINDINGS

Cardiovascular: Coronary artery calcification. Heart is enlarged.
Left ventricle is dilated. No pericardial effusion.

Mediastinum/Nodes: Heterogeneous low-attenuation thyroid nodules
measure up to at least 1.9 x 2.4 cm on the left. Mediastinal lymph
nodes measure up to 11 mm in the low left paratracheal station,
similar. No hilar, axillary, low internal jugular or supraclavicular
adenopathy. Esophagus is unremarkable.

Lungs/Pleura: Biapical pleuroparenchymal scarring. Coarsened areas
of pulmonary parenchymal ground-glass, with a basilar predominance.
Associated mild bronchiectasis. Findings appears similar to
[DATE]. 2 mm nodule in the posterior aspect of the superior
segment right lower lobe (6/78), unchanged or even minimally
smaller, previously 3 mm. 3 mm peripheral right upper lobe nodule
(6/46), stable. No new pulmonary nodules. No pleural fluid. Airway
is unremarkable.

Musculoskeletal: No worrisome lytic or sclerotic lesions.

CT ABDOMEN PELVIS FINDINGS

Hepatobiliary: Subcentimeter low-attenuation lesion in the dome of
the left hepatic lobe is too small to characterize. Liver and
gallbladder are otherwise unremarkable. No biliary ductal
dilatation.

Pancreas: Negative.

Spleen: Low-attenuation lesions in the spleen measure up to 1.9 cm,
as before and difficult to further characterize.

Adrenals/Urinary Tract: Adrenal glands and kidneys are unremarkable.
Ureters are decompressed. Bladder is grossly unremarkable.

Stomach/Bowel: Stomach and majority of the small bowel are
unremarkable. Very short-segment intussusception in the distal small
bowel (2/113 and 4/60), new from [DATE] and most likely
transient. Remainder of the small bowel, appendix and colon are
otherwise unremarkable.

Vascular/Lymphatic: Atherosclerotic calcification of the aorta.
Heterogeneously hyperattenuating retroperitoneal adenopathy in the
abdomen and pelvis has improved in the interval. Index left
periaortic lymph node measures 1.9 x 2.4 cm (2/73), previously 2.4 x
2.8 cm on [DATE]. Index left external iliac lymph node now
measures 1.2 x 1.4 cm (2/111), previously 1.8 x 2.1 cm on
[DATE].

Reproductive: Hysterectomy.  No adnexal mass.

Other: No free fluid.  Mesenteries and peritoneum are unremarkable.

Musculoskeletal: No worrisome lytic or sclerotic lesions.
IMPRESSION: 1. Continued regression in abdominopelvic retroperitoneal
adenopathy.
2. Similar to minimally smaller pulmonary nodules.
3. Hypodense lesions in the spleen, unchanged and difficult to
further characterize. Recommend continued attention on follow-up.
4. Peripheral and basilar predominant coarsened ground-glass and
mild bronchiectasis may be due to immunotherapy related interstitial
lung disease. If further evaluation is desired, high-resolution
chest CT without contrast is recommended.
5. Very short segment distal small bowel intussusception, likely
transient. Recommend attention on follow-up.
6. Thyroid nodules. Previous thyroid ultrasound [DATE]. No
follow-up recommended unless clinically warranted. (Ref: [HOSPITAL]. [DATE]): 143-50).
7. Aortic atherosclerosis ([T4]-[T4]). Coronary artery
calcification.

## 2021-09-13 MED ORDER — IOHEXOL 350 MG/ML SOLN
75.0000 mL | Freq: Once | INTRAVENOUS | Status: AC | PRN
  Administered 2021-09-13: 75 mL via INTRAVENOUS

## 2021-09-20 ENCOUNTER — Other Ambulatory Visit: Payer: Self-pay

## 2021-09-20 ENCOUNTER — Inpatient Hospital Stay: Payer: BC Managed Care – PPO | Attending: Oncology

## 2021-09-20 ENCOUNTER — Inpatient Hospital Stay: Payer: BC Managed Care – PPO

## 2021-09-20 ENCOUNTER — Inpatient Hospital Stay (HOSPITAL_BASED_OUTPATIENT_CLINIC_OR_DEPARTMENT_OTHER): Payer: BC Managed Care – PPO | Admitting: Oncology

## 2021-09-20 VITALS — BP 108/72 | HR 68 | Temp 97.2°F | Resp 17 | Wt 145.3 lb

## 2021-09-20 DIAGNOSIS — C4370 Malignant melanoma of unspecified lower limb, including hip: Secondary | ICD-10-CM | POA: Insufficient documentation

## 2021-09-20 DIAGNOSIS — Z79899 Other long term (current) drug therapy: Secondary | ICD-10-CM | POA: Insufficient documentation

## 2021-09-20 DIAGNOSIS — C439 Malignant melanoma of skin, unspecified: Secondary | ICD-10-CM

## 2021-09-20 DIAGNOSIS — Z5112 Encounter for antineoplastic immunotherapy: Secondary | ICD-10-CM | POA: Diagnosis not present

## 2021-09-20 LAB — CBC WITH DIFFERENTIAL (CANCER CENTER ONLY)
Abs Immature Granulocytes: 0 10*3/uL (ref 0.00–0.07)
Basophils Absolute: 0 10*3/uL (ref 0.0–0.1)
Basophils Relative: 1 %
Eosinophils Absolute: 0.1 10*3/uL (ref 0.0–0.5)
Eosinophils Relative: 3 %
HCT: 40.8 % (ref 36.0–46.0)
Hemoglobin: 13.2 g/dL (ref 12.0–15.0)
Immature Granulocytes: 0 %
Lymphocytes Relative: 42 %
Lymphs Abs: 1.7 10*3/uL (ref 0.7–4.0)
MCH: 27.8 pg (ref 26.0–34.0)
MCHC: 32.4 g/dL (ref 30.0–36.0)
MCV: 85.9 fL (ref 80.0–100.0)
Monocytes Absolute: 0.3 10*3/uL (ref 0.1–1.0)
Monocytes Relative: 8 %
Neutro Abs: 1.9 10*3/uL (ref 1.7–7.7)
Neutrophils Relative %: 46 %
Platelet Count: 223 10*3/uL (ref 150–400)
RBC: 4.75 MIL/uL (ref 3.87–5.11)
RDW: 14.6 % (ref 11.5–15.5)
WBC Count: 4 10*3/uL (ref 4.0–10.5)
nRBC: 0 % (ref 0.0–0.2)

## 2021-09-20 LAB — CMP (CANCER CENTER ONLY)
ALT: 14 U/L (ref 0–44)
AST: 14 U/L — ABNORMAL LOW (ref 15–41)
Albumin: 4 g/dL (ref 3.5–5.0)
Alkaline Phosphatase: 67 U/L (ref 38–126)
Anion gap: 10 (ref 5–15)
BUN: 11 mg/dL (ref 8–23)
CO2: 27 mmol/L (ref 22–32)
Calcium: 9.2 mg/dL (ref 8.9–10.3)
Chloride: 106 mmol/L (ref 98–111)
Creatinine: 0.67 mg/dL (ref 0.44–1.00)
GFR, Estimated: >60 mL/min (ref >60)
Glucose, Bld: 64 mg/dL — ABNORMAL LOW (ref 70–99)
Potassium: 4 mmol/L (ref 3.5–5.1)
Sodium: 143 mmol/L (ref 135–145)
Total Bilirubin: 0.3 mg/dL (ref 0.3–1.2)
Total Protein: 7.5 g/dL (ref 6.5–8.1)

## 2021-09-20 LAB — TSH: TSH: 1.457 u[IU]/mL (ref 0.308–3.960)

## 2021-09-20 MED ORDER — SODIUM CHLORIDE 0.9 % IV SOLN: Freq: Once | INTRAVENOUS | Status: AC

## 2021-09-20 MED ORDER — SODIUM CHLORIDE 0.9 % IV SOLN
480.0000 mg | Freq: Once | INTRAVENOUS | Status: AC
  Administered 2021-09-20: 480 mg via INTRAVENOUS
  Filled 2021-09-20: qty 48

## 2021-09-20 NOTE — Progress Notes (Signed)
Hematology and Oncology Follow Up  Sharon Kirk [id number] 1961/06/13 60 y.o. 09/20/2021 11:17 AM [name], MD       Principle Diagnosis: 60 year old woman with stage IV melanoma with abdominal adenopathy diagnosed in May 2022.  She presented with T2bN1 superficial spreading tumor in the lower extremity diagnosed in 2017   Secondary diagnoses:    1. Superficial spreading melanoma of the lower extremity diagnosed in August 2017. She was found to have 1.25 mm thickness without ulceration or lymphovascular invasion. She had 1 out of 2 lymph nodes positive. The size of the lymph node measuring 0.25 x 0.95 mm with pathological staging T2bN1.    2. Superficial spreading melanoma of the right shoulder diagnosed in August 2017.She is status post wide local excision and found tohave a 0.72 mm. These findings indicate a pathological staging of T1 NA.     Prior Therapy: She is status post local excision followed by sentinel lymph node sampling with 2 lymph nodes sampled one was involved with metastatic melanoma. The measurement was 0.25 x 0.95 mm in dimension of the capsule. Her final pathological stage stage is T2bN1. This was completed on 05/23/2016.    Nivolumab total of 240 mg dose every 2 weeks for total of 4 doses. Started on 07/21/2016.  She completed 4 cycles of therapy for a total of 2 months out of planned 12.  Patient opted out continuing immunotherapy.  She is status post lymph node biopsy completed on Feb 15, 2021.  Ipilimumab 3 mg/kg with a nivolumab at 1 mg/kg started on Mar 04, 2021.  She is status post 4 cycles of therapy.   Current therapy:   Nivolumab 480 mg every 4 weeks started on May 27, 2021.  She presents today for a return evaluation prior to the next cycle.  Interim History: Sharon Kirk returns today for a follow-up visit.  Since the last visit, she reports no major changes in her health.  She denies any recent hospitalizations or illnesses.   She denies any shortness of breath, cough or wheezing.  She denies any nausea, vomiting or abdominal pain.  He denies any weight loss or change in her performance status.  She still has some mild discomfort in her left hip but not interfering with function at this time.    Medications: Reviewed without changes. Current Outpatient Medications  Medication Sig Dispense Refill   albuterol (VENTOLIN HFA) 108 (90 Base) MCG/ACT inhaler Inhale 1 puff into the lungs every 4 (four) hours as needed for wheezing or shortness of breath. 1 each 2   apixaban (ELIQUIS) 5 MG TABS tablet Take 1 tablet (5 mg total) by mouth 2 (two) times daily. 60 tablet 2   calcium carbonate (OS-CAL) 600 MG TABS Take 600 mg by mouth 2 (two) times daily with a meal.     cholecalciferol (VITAMIN D) 1000 UNITS tablet Take 5,000 Units by mouth daily.     Cyanocobalamin (VITAMIN B-12 CR PO) Take 1 tablet by mouth daily.     HYDROcodone-acetaminophen (NORCO) 5-325 MG tablet Take 1 tablet by mouth every 6 (six) hours as needed for moderate pain. 30 tablet 0   LORazepam (ATIVAN) 1 MG tablet Take one tablet one hour before MRI (Patient not taking: Reported on 05/04/2021) 30 tablet 0   prochlorperazine (COMPAZINE) 10 MG tablet Take 1 tablet (10 mg total) by mouth every 6 (six) hours as needed for nausea or vomiting. 30 tablet 0   venlafaxine XR (EFFEXOR XR) 37.5 MG  24 hr capsule Take 1 capsule (37.5 mg total) by mouth daily with breakfast. 90 capsule 0   No current facility-administered medications for this visit.     Allergies:  Allergies  Allergen Reactions   Codeine Nausea Only and Other (See Comments)    Hallucinations; confusion   Physical exam    Blood pressure 108/72, pulse 68, temperature (!) 97.2 F (36.2 C), temperature source Tympanic, resp. rate 17, weight 145 lb 4.8 oz (65.9 kg), last menstrual period 06/30/1990, SpO2 100 %.     ECOG 0   General appearance: Comfortable appearing without any discomfort Head:  Normocephalic without any trauma Oropharynx: Mucous membranes are moist and pink without any thrush or ulcers. Eyes: Pupils are equal and round reactive to light. Lymph nodes: No cervical, supraclavicular, inguinal or axillary lymphadenopathy.   Heart:regular rate and rhythm.  S1 and S2 without leg edema. Lung: Clear without any rhonchi or wheezes.  No dullness to percussion. Abdomin: Soft, nontender, nondistended with good bowel sounds.  No hepatosplenomegaly. Musculoskeletal: No joint deformity or effusion.  Full range of motion noted. Neurological: No deficits noted on motor, sensory and deep tendon reflex exam. Skin: No petechial rash or dryness.  Appeared moist.             Lab Results: Lab Results  Component Value Date   WBC 4.3 08/17/2021   HGB 13.5 08/17/2021   HCT 40.8 08/17/2021   MCV 82.9 08/17/2021   PLT 226 08/17/2021     Chemistry      Component Value Date/Time   NA 142 08/17/2021 1206   NA 140 09/08/2016 0755   K 3.9 08/17/2021 1206   K 3.9 09/08/2016 0755   CL 105 08/17/2021 1206   CO2 27 08/17/2021 1206   CO2 23 09/08/2016 0755   BUN 13 08/17/2021 1206   BUN 11.2 09/08/2016 0755   CREATININE 0.69 08/17/2021 1206   CREATININE 0.61 09/30/2019 0839   CREATININE 0.7 09/08/2016 0755      Component Value Date/Time   CALCIUM 9.4 08/17/2021 1206   CALCIUM 9.1 09/08/2016 0755   ALKPHOS 76 08/17/2021 1206   ALKPHOS 61 09/08/2016 0755   AST 15 08/17/2021 1206   AST 13 09/08/2016 0755   ALT 17 08/17/2021 1206   ALT 16 09/08/2016 0755   BILITOT 0.4 08/17/2021 1206   BILITOT 0.42 09/08/2016 0755      IMPRESSION: 1. Continued regression in abdominopelvic retroperitoneal adenopathy. 2. Similar to minimally smaller pulmonary nodules. 3. Hypodense lesions in the spleen, unchanged and difficult to further characterize. Recommend continued attention on follow-up. 4. Peripheral and basilar predominant coarsened ground-glass and mild bronchiectasis may be  due to immunotherapy related interstitial lung disease. If further evaluation is desired, high-resolution chest CT without contrast is recommended. 5. Very short segment distal small bowel intussusception, likely transient. Recommend attention on follow-up. 6. Thyroid nodules. Previous thyroid ultrasound 06/19/2019. No follow-up recommended unless clinically warranted. (Ref: J Am Coll Radiol. 2015 Feb;12(2): 143-50). 7. Aortic atherosclerosis (ICD10-I70.0). Coronary artery calcification.  Impression and Plan:   60 year old woman with:  1.  Stage IV superficial spreading with pelvic adenopathy noted in 2022.    CT scan obtained on September 13, 2021 was personally reviewed and continues to show excellent response to therapy with regression of her abdominal adenopathy.  Risks and benefits of continuing this treatment were reviewed at this time.  Complications that include immune mediated issues as well as GI toxicity were discussed.  She is agreeable to continue.  2.  IV access: Peripheral veins are currently in use and will continue to defer a Port-A-Cath insertion.  3.  Antiemetics: Compazine is available to her without any nausea or vomiting.   4.  Autoimmune complications: She has not experienced any complications at this time.  Radiographic evidence of pneumonitis is possible and she has no symptoms.   5.  CNS staging: No evidence of CNS disease at this time and we will update in the future.  6.  Hip pain/thigh pain: Does not appear to be related to malignancy.  7.  Treatment goal and prognosis: Treatment remains palliative though aggressive measures are warranted given her excellent performance status.  8.  Follow-up: In 4 weeks for repeat follow-up.     30  minutes were spent on this visit.  Time was dedicated to reviewing imaging studies, disease status update, treatment choices and addressing complication related to cancer and cancer therapy.   Zola Button, MD 09/20/2021  11:17 AM

## 2021-09-20 NOTE — Patient Instructions (Signed)
[facility] MEDICAL [facility]COLOG[facility]  ? Discharge Instructions: ?Thank you for choosing [facility] to provide your oncology and hematology care.  ? ?If you have a lab appointment with the Cancer Center, please go directly to the Cancer Center and check in at the registration area. ?  ?Wear comfortable clothing and clothing appropriate for easy access to any Portacath or PICC line.  ? ?We strive to give you quality time with your provider. You may need to reschedule your appointment if you arrive late (15 or more minutes).  Arriving late affects you and other patients whose appointments are after yours.  Also, if you miss three or more appointments without notifying the office, you may be dismissed from the clinic at the provider?s discretion.    ?  ?For prescription refill requests, have your pharmacy contact our office and allow 72 hours for refills to be completed.   ? ?Today you received the following chemotherapy and/or immunotherapy agents: nivolumab    ?  ?To help prevent nausea and vomiting after your treatment, we encourage you to take your nausea medication as directed. ? ?BELOW ARE SYMPTOMS THAT SHOULD BE REPORTED IMMEDIATELY: ?*FEVER GREATER THAN 100.4 F (38 ?C) OR HIGHER ?*CHILLS OR SWEATING ?*NAUSEA AND VOMITING THAT IS NOT CONTROLLED WITH YOUR NAUSEA MEDICATION ?*UNUSUAL SHORTNESS OF BREATH ?*UNUSUAL BRUISING OR BLEEDING ?*URINARY PROBLEMS (pain or burning when urinating, or frequent urination) ?*BOWEL PROBLEMS (unusual diarrhea, constipation, pain near the anus) ?TENDERNESS IN MOUTH AND THROAT WITH OR WITHOUT PRESENCE OF ULCERS (sore throat, sores in mouth, or a toothache) ?UNUSUAL RASH, SWELLING OR PAIN  ?UNUSUAL VAGINAL DISCHARGE OR ITCHING  ? ?Items with * indicate a potential emergency and should be followed up as soon as possible or go to the Emergency Department if any problems should occur. ? ?Please show the CHEMOTHERAPY ALERT CARD or IMMUNOTHERAPY ALERT CARD at check-in  to the Emergency Department and triage nurse. ? ?Should you have questions after your visit or need to cancel or reschedule your appointment, please contact [facility]  Dept: [phone]  and follow the prompts.  Office hours are 8:00 a.m. to 4:30 p.m. Monday - Friday. Please note that voicemails left after 4:00 p.m. may not be returned until the following business day.  We are closed weekends and major holidays. You have access to a nurse at all times for urgent questions. Please call the main number to the clinic Dept: [phone] and follow the prompts. ? ? ?For any non-urgent questions, you may also contact your provider using MyChart. We now offer e-Visits for anyone 18 and older to request care online for non-urgent symptoms. For details visit [email or web address]. ?  ?Also download the MyChart app! Go to the app store, search "MyChart", open the app, select [facility], and log in with your MyChart username and password. ? ?Due to Covid, a mask is required upon entering the hospital/clinic. If you do not have a mask, one will be given to you upon arrival. For doctor visits, patients may have 1 support person aged 18 or older with them. For treatment visits, patients cannot have anyone with them due to current Covid guidelines and our immunocompromised population.  ? ?

## 2021-10-04 ENCOUNTER — Other Ambulatory Visit: Payer: Self-pay

## 2021-10-04 ENCOUNTER — Encounter: Payer: Self-pay | Admitting: Nurse Practitioner

## 2021-10-04 ENCOUNTER — Ambulatory Visit (INDEPENDENT_AMBULATORY_CARE_PROVIDER_SITE_OTHER): Payer: BC Managed Care – PPO | Admitting: Nurse Practitioner

## 2021-10-04 VITALS — BP 122/76 | Ht 65.0 in | Wt 144.0 lb

## 2021-10-04 DIAGNOSIS — N951 Menopausal and female climacteric states: Secondary | ICD-10-CM | POA: Diagnosis not present

## 2021-10-04 DIAGNOSIS — M85852 Other specified disorders of bone density and structure, left thigh: Secondary | ICD-10-CM

## 2021-10-04 DIAGNOSIS — Z780 Asymptomatic menopausal state: Secondary | ICD-10-CM | POA: Diagnosis not present

## 2021-10-04 DIAGNOSIS — Z01419 Encounter for gynecological examination (general) (routine) without abnormal findings: Secondary | ICD-10-CM | POA: Diagnosis not present

## 2021-10-04 DIAGNOSIS — M85851 Other specified disorders of bone density and structure, right thigh: Secondary | ICD-10-CM | POA: Diagnosis not present

## 2021-10-04 NOTE — Progress Notes (Signed)
Sharon Kirk 1960-11-24 [id number]   History:  60 y.o. G0 presents for annual exam. Postmenopausal - stopped HRT a couple of years ago but was still having hot flashes, did not tolerate side effects of Effexor, taking OTC supplement. They are worse since starting chemotherapy in August. Being treated for stage IV melanoma since May 2022. She is responding well. Prior 2017 superficial spreading tumor in lower extremity and right shoulder. S/P 1991 TVH. DVT October 07/2020 while hospitalized with covid. Normal pap and mammogram history. Osteopenia of bilateral hips. Hypothyroidism managed by endocrinology.   Gynecologic History Patient's last menstrual period was 06/30/1990.   Contraception: status post hysterectomy Sexually active: Yes  Health maintenance Last Pap: 07/31/2011. Results were: Normal Last mammogram: 10/05/2020 Results were: Normal. Scheduled 10/06/2021 Last colonoscopy: 2020. Results were: Normal, 10-year recall Last Dexa: 11/16/2020. Results were: T-score -1.6, FRAX 8.7% / 0.8%  Past medical history, past surgical history, family history and social history were all reviewed and documented in the EPIC chart. Married. Works in [employer] at [employer].   ROS:  A ROS was performed and pertinent positives and negatives are included.  Exam:  Vitals:   10/04/21 0807  BP: 122/76  Weight: 144 lb (65.3 kg)  Height: 5\' 5"  (1.651 m)    Body mass index is 23.96 kg/m.  General appearance:  Normal Thyroid:  Symmetrical, normal in size, without palpable masses or nodularity. Respiratory  Auscultation:  Clear without wheezing or rhonchi Cardiovascular  Auscultation:  Regular rate, without rubs, murmurs or gallops  Edema/varicosities:  Not grossly evident Abdominal  Soft,nontender, without masses, guarding or rebound.  Liver/spleen:  No organomegaly noted  Hernia:  None appreciated  Skin  Inspection:  Grossly normal   Breasts: Examined lying and  sitting.   Right: Without masses, retractions, discharge or axillary adenopathy.   Left: Without masses, retractions, discharge or axillary adenopathy. Genitourinary   Inguinal/mons:  Normal without inguinal adenopathy  External genitalia:  Normal appearing vulva with no masses, tenderness, or lesions  BUS/Urethra/Skene's glands:  Normal  Vagina:  Normal appearing with normal color and discharge, no lesions  Cervix:  Absent  Uterus:  Absent  Adnexa/parametria:     Rt: Normal in size, without masses or tenderness.   Lt: Normal in size, without masses or tenderness.  Anus and perineum: Normal  Digital rectal exam: Normal sphincter tone without palpated masses or tenderness  Patient informed chaperone available to be present for breast and pelvic exam. Patient has requested no chaperone to be present. Patient has been advised what will be completed during breast and pelvic exam.   Assessment/Plan:  60 y.o. G0 for annual exam.   Well female exam with routine gynecological exam - Education provided on SBEs, importance of preventative screenings, current guidelines, high calcium diet, regular exercise, and multivitamin daily. Labs with PCP.   Postmenopausal - No HRT. S/P S/P 1991 TVH.  Vasomotor symptoms due to menopause - Stopped HRT a couple of years ago and continued having hot flashes, did not tolerate side effects of Effexor, taking Vitamin A and E. Tried OTC supplements with no improvement. They are worse since starting chemotherapy in August. We discussed lifestyle modifications for managing.   Osteopenia of necks of both femurs - T-score - 1.6 without elevated FRAX in February 2022. Continue Vitamin D + Calcium and regular exercise. Will repeat 11/2022.   Screening for cervical cancer - Normal pap history.  No longer screening per guidelines.  Screening for breast cancer - Normal mammogram  history.  Continue annual screenings.  Mammogram scheduled 10/06/2021.  Breast exam normal  today.  Screening for colon cancer - Normal colonoscopy in 2020.  She will repeat at 10-year interval per GI's recommendation.   Follow-up in 1 year for annual.    Tamela Gammon [facility], 8:43 AM 10/04/2021

## 2021-10-06 ENCOUNTER — Ambulatory Visit
Admission: RE | Admit: 2021-10-06 | Discharge: 2021-10-06 | Disposition: A | Discharge status: Home / Self Care | Payer: BC Managed Care – PPO | Source: Ambulatory Visit | Attending: Nurse Practitioner | Admitting: Nurse Practitioner

## 2021-10-06 DIAGNOSIS — Z1231 Encounter for screening mammogram for malignant neoplasm of breast: Secondary | ICD-10-CM

## 2021-10-06 IMAGING — MG MM DIGITAL SCREENING BILAT W/ TOMO AND CAD
8 series · 9 of 24 positions shown · non-contrast
Comparison: Previous exam(s).

CLINICAL DATA: Screening.

EXAM:
DIGITAL SCREENING BILATERAL MAMMOGRAM WITH TOMOSYNTHESIS AND CAD
TECHNIQUE: Bilateral screening digital craniocaudal and mediolateral oblique
mammograms were obtained. Bilateral screening digital breast
tomosynthesis was performed. The images were evaluated with
computer-aided detection.

[L CC synth-2D]
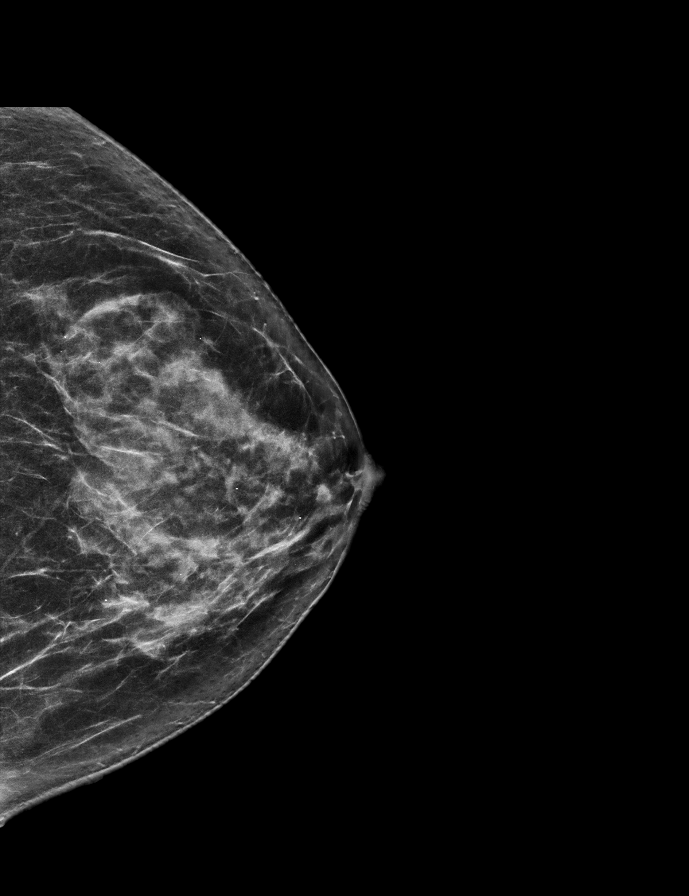

[L MLO synth-2D]
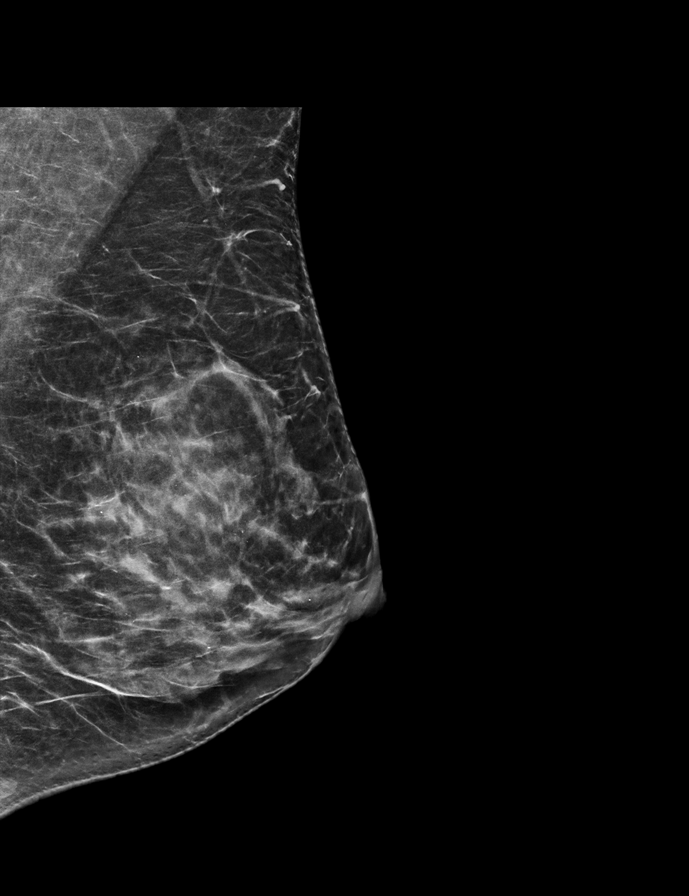

[R CC synth-2D]
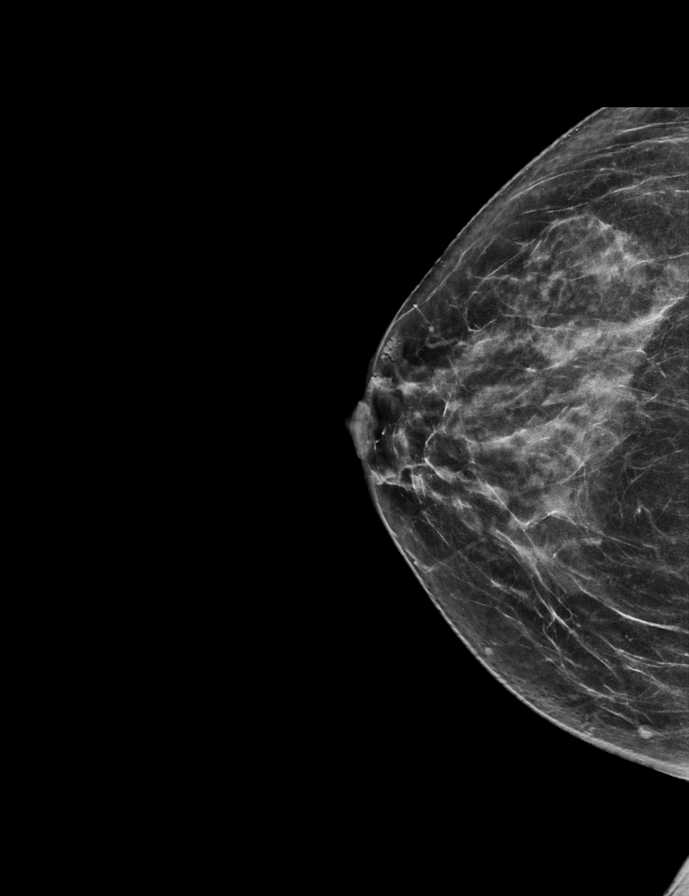

[R MLO synth-2D]
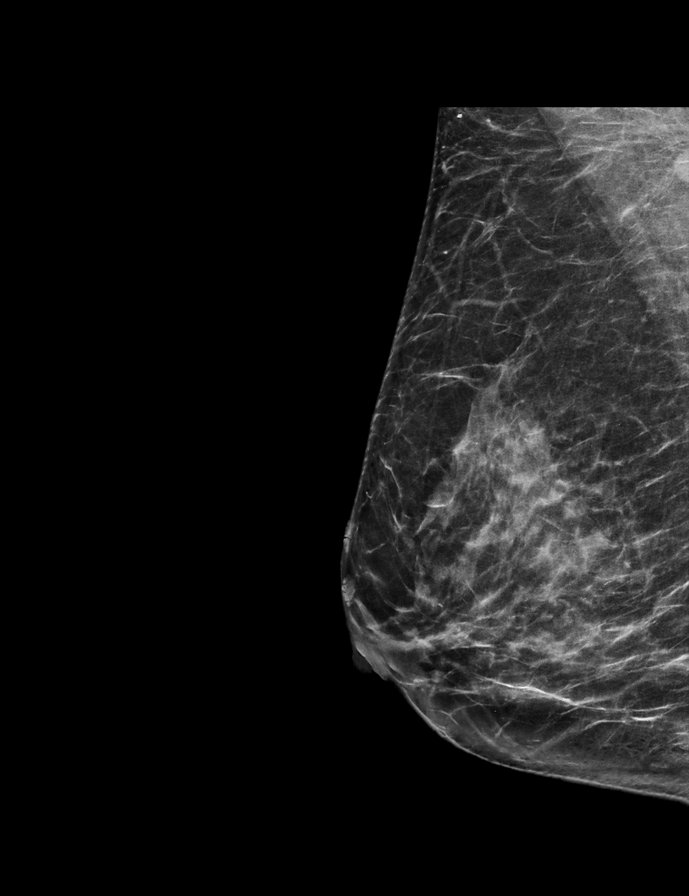

[R CC tomo · 2 of 58 frames shown]
[frame 19/58]
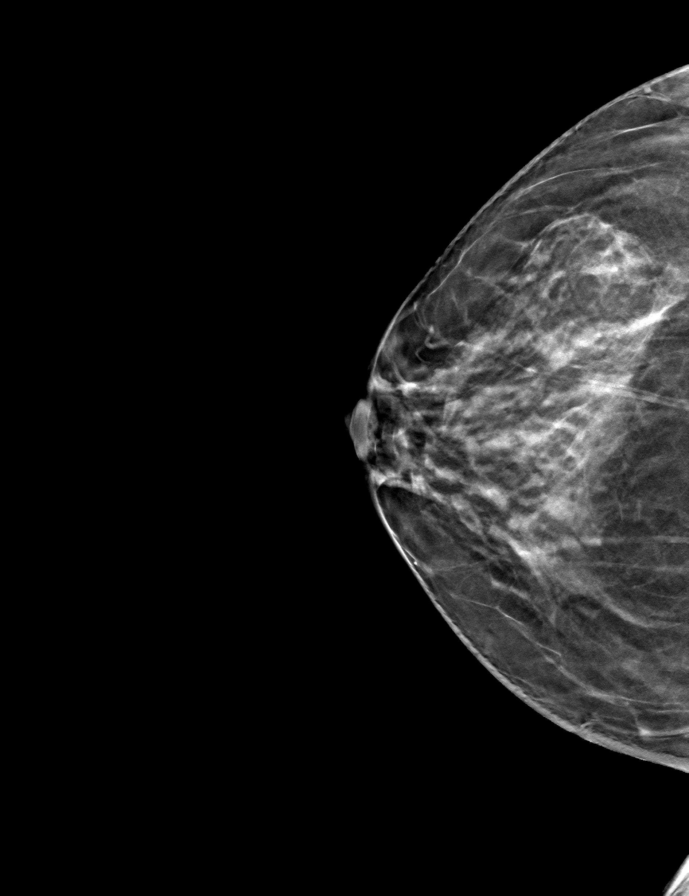
[frame 29/58]
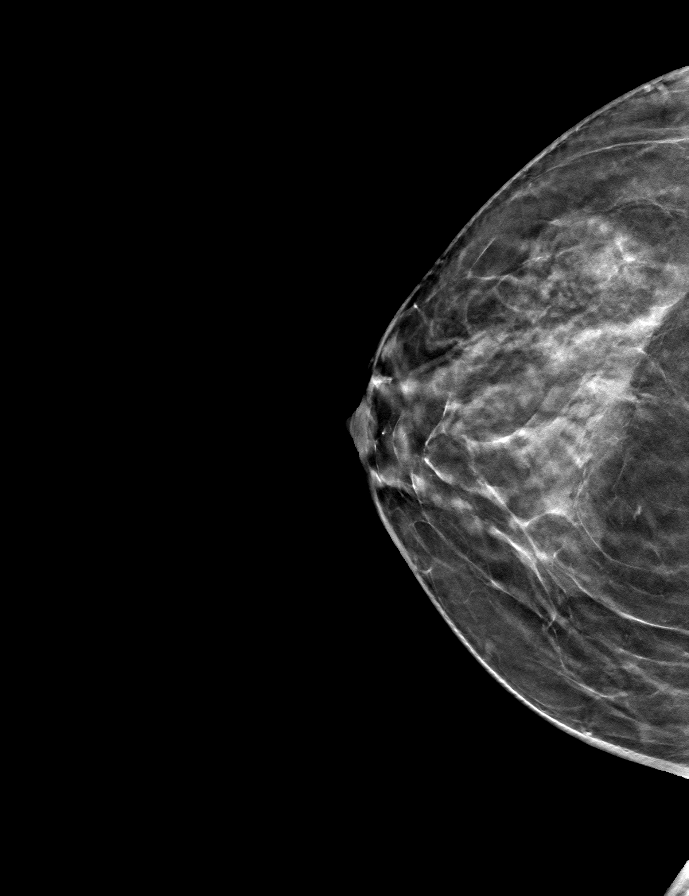

[L CC tomo · tomo slice 33/64.0]
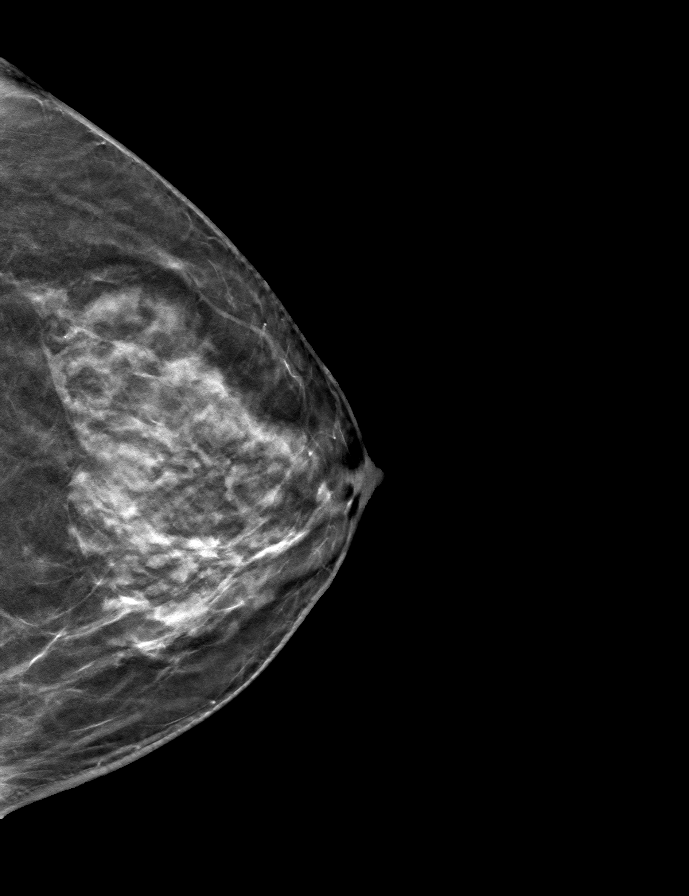

[L MLO tomo · tomo slice 32/63.0]
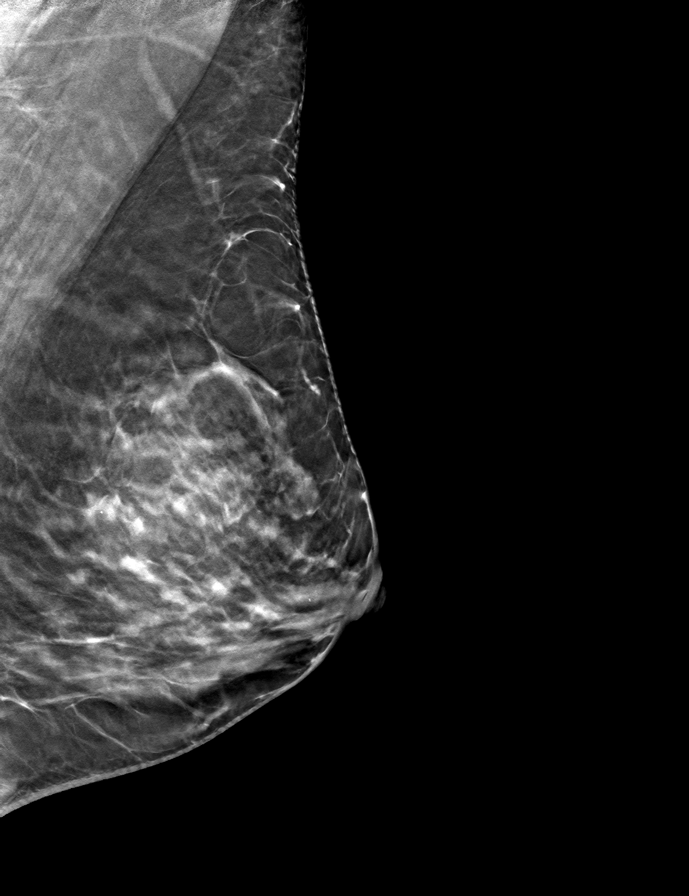

[R MLO tomo · tomo slice 31/62.0]
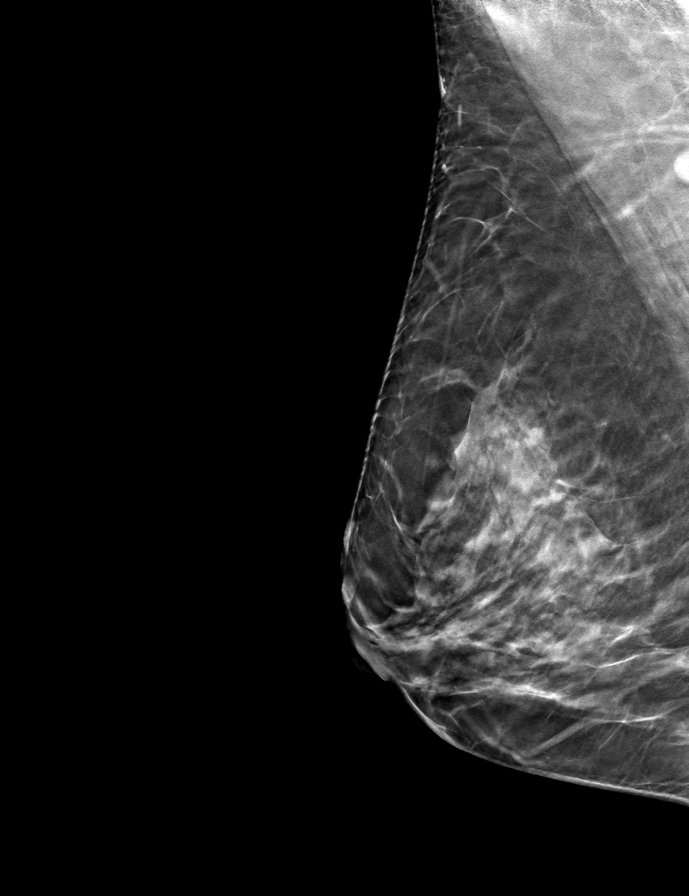

[9 of 24 positions shown; findings below may reference images not displayed]

ACR Breast Density Category c: The breast tissue is heterogeneously
dense, which may obscure small masses.
FINDINGS: There are no findings suspicious for malignancy.
IMPRESSION: No mammographic evidence of malignancy. A result letter of this
screening mammogram will be mailed directly to the patient.

RECOMMENDATION:
Screening mammogram in one year. (Code:[V2])

BI-RADS CATEGORY  1: Negative.

## 2021-10-18 ENCOUNTER — Inpatient Hospital Stay: Payer: BC Managed Care – PPO | Admitting: Oncology

## 2021-10-18 ENCOUNTER — Other Ambulatory Visit: Payer: Self-pay

## 2021-10-18 ENCOUNTER — Inpatient Hospital Stay: Payer: BC Managed Care – PPO | Attending: Oncology

## 2021-10-18 ENCOUNTER — Inpatient Hospital Stay: Payer: BC Managed Care – PPO

## 2021-10-18 VITALS — BP 105/72 | HR 68 | Temp 96.3°F | Resp 18 | Ht 65.0 in | Wt 143.7 lb

## 2021-10-18 DIAGNOSIS — C439 Malignant melanoma of skin, unspecified: Secondary | ICD-10-CM | POA: Diagnosis not present

## 2021-10-18 DIAGNOSIS — Z5112 Encounter for antineoplastic immunotherapy: Secondary | ICD-10-CM | POA: Insufficient documentation

## 2021-10-18 DIAGNOSIS — R591 Generalized enlarged lymph nodes: Secondary | ICD-10-CM

## 2021-10-18 DIAGNOSIS — Z79899 Other long term (current) drug therapy: Secondary | ICD-10-CM | POA: Insufficient documentation

## 2021-10-18 DIAGNOSIS — C4370 Malignant melanoma of unspecified lower limb, including hip: Secondary | ICD-10-CM | POA: Insufficient documentation

## 2021-10-18 LAB — CMP (CANCER CENTER ONLY)
ALT: 13 U/L (ref 0–44)
AST: 12 U/L — ABNORMAL LOW (ref 15–41)
Albumin: 4.3 g/dL (ref 3.5–5.0)
Alkaline Phosphatase: 65 U/L (ref 38–126)
Anion gap: 8 (ref 5–15)
BUN: 12 mg/dL (ref 8–23)
CO2: 26 mmol/L (ref 22–32)
Calcium: 9.5 mg/dL (ref 8.9–10.3)
Chloride: 106 mmol/L (ref 98–111)
Creatinine: 0.49 mg/dL (ref 0.44–1.00)
GFR, Estimated: >60 mL/min (ref >60)
Glucose, Bld: 86 mg/dL (ref 70–99)
Potassium: 4.1 mmol/L (ref 3.5–5.1)
Sodium: 140 mmol/L (ref 135–145)
Total Bilirubin: 0.4 mg/dL (ref 0.3–1.2)
Total Protein: 7.5 g/dL (ref 6.5–8.1)

## 2021-10-18 LAB — CBC WITH DIFFERENTIAL (CANCER CENTER ONLY)
Abs Immature Granulocytes: 0 10*3/uL (ref 0.00–0.07)
Basophils Absolute: 0 10*3/uL (ref 0.0–0.1)
Basophils Relative: 1 %
Eosinophils Absolute: 0.1 10*3/uL (ref 0.0–0.5)
Eosinophils Relative: 2 %
HCT: 39.6 % (ref 36.0–46.0)
Hemoglobin: 13.2 g/dL (ref 12.0–15.0)
Immature Granulocytes: 0 %
Lymphocytes Relative: 36 %
Lymphs Abs: 1.7 10*3/uL (ref 0.7–4.0)
MCH: 28.2 pg (ref 26.0–34.0)
MCHC: 33.3 g/dL (ref 30.0–36.0)
MCV: 84.6 fL (ref 80.0–100.0)
Monocytes Absolute: 0.4 10*3/uL (ref 0.1–1.0)
Monocytes Relative: 8 %
Neutro Abs: 2.5 10*3/uL (ref 1.7–7.7)
Neutrophils Relative %: 53 %
Platelet Count: 215 10*3/uL (ref 150–400)
RBC: 4.68 MIL/uL (ref 3.87–5.11)
RDW: 13.9 % (ref 11.5–15.5)
WBC Count: 4.6 10*3/uL (ref 4.0–10.5)
nRBC: 0 % (ref 0.0–0.2)

## 2021-10-18 LAB — TSH: TSH: 0.741 u[IU]/mL (ref 0.308–3.960)

## 2021-10-18 MED ORDER — SODIUM CHLORIDE 0.9 % IV SOLN
480.0000 mg | Freq: Once | INTRAVENOUS | Status: AC
  Administered 2021-10-18: 480 mg via INTRAVENOUS
  Filled 2021-10-18: qty 48

## 2021-10-18 MED ORDER — SODIUM CHLORIDE 0.9 % IV SOLN: Freq: Once | INTRAVENOUS | Status: AC

## 2021-10-18 NOTE — Progress Notes (Signed)
Hematology and Oncology Follow Up  Sharon Kirk [id number] 01-Jun-1961 61 y.o. 10/18/2021 10:00 AM [name], [name], MD       Principle Diagnosis: 57 year old woman with T2bN1 superficial spreading tumor in the lower extremity diagnosed in 2017.she developed stage IV with adenopathy diagnosed in May 2022.   Secondary diagnoses:    1. Superficial spreading melanoma of the lower extremity diagnosed in August 2017. She was found to have 1.25 mm thickness without ulceration or lymphovascular invasion. She had 1 out of 2 lymph nodes positive. The size of the lymph node measuring 0.25 x 0.95 mm with pathological staging T2bN1.    2. Superficial spreading melanoma of the right shoulder diagnosed in August 2017.She is status post wide local excision and found tohave a 0.72 mm. These findings indicate a pathological staging of T1 NA.     Prior Therapy: She is status post local excision followed by sentinel lymph node sampling with 2 lymph nodes sampled one was involved with metastatic melanoma. The measurement was 0.25 x 0.95 mm in dimension of the capsule. Her final pathological stage stage is T2bN1. This was completed on 05/23/2016.    Nivolumab total of 240 mg dose every 2 weeks for total of 4 doses. Started on 07/21/2016.  She completed 4 cycles of therapy for a total of 2 months out of planned 12.  Patient opted out continuing immunotherapy.  She is status post lymph node biopsy completed on Feb 15, 2021.  Ipilimumab 3 mg/kg with a nivolumab at 1 mg/kg started on Mar 04, 2021.  She is status post 4 cycles of therapy.   Current therapy:   Nivolumab 480 mg every 4 weeks started on May 27, 2021.  She she is here for the next cycle of therapy.  Interim History: Sharon Kirk is here for repeat evaluation.  Since the last visit, she reports no major changes in her health.  She denies any recent hospitalizations or illnesses.  She denies any nausea, vomiting or abdominal pain.  She  denies any excessive fatigue or tiredness.  She denies any bone pain or pathological fractures.  She has also reported hip pain which is unchanged at this time.   Medications: Updated on review. Current Outpatient Medications  Medication Sig Dispense Refill   Biotin 1000 MCG CHEW Chew by mouth.     calcium carbonate (OS-CAL) 600 MG TABS Take 600 mg by mouth 2 (two) times daily with a meal.     cholecalciferol (VITAMIN D) 1000 UNITS tablet Take 5,000 Units by mouth daily.     Cyanocobalamin (VITAMIN B-12 CR PO) Take 1 tablet by mouth daily.     No current facility-administered medications for this visit.     Allergies:  Allergies  Allergen Reactions   Codeine Nausea Only and Other (See Comments)    Hallucinations; confusion   Physical exam      Blood pressure 105/72, pulse 68, temperature (!) 96.3 F (35.7 C), temperature source Temporal, resp. rate 18, height 5\' 5"  (1.651 m), weight 143 lb 11.2 oz (65.2 kg), last menstrual period 06/30/1990, SpO2 100 %.    ECOG 0    General appearance: Alert, awake without any distress. Head: Atraumatic without abnormalities Oropharynx: Without any thrush or ulcers. Eyes: No scleral icterus. Lymph nodes: No lymphadenopathy noted in the cervical, supraclavicular, or axillary nodes Heart:regular rate and rhythm, without any murmurs or gallops.   Lung: Clear to auscultation without any rhonchi, wheezes or dullness to percussion. Abdomin: Soft, nontender without  any shifting dullness or ascites. Musculoskeletal: No clubbing or cyanosis. Neurological: No motor or sensory deficits. Skin: No rashes or lesions.             Lab Results: Lab Results  Component Value Date   WBC 4.0 09/20/2021   HGB 13.2 09/20/2021   HCT 40.8 09/20/2021   MCV 85.9 09/20/2021   PLT 223 09/20/2021     Chemistry      Component Value Date/Time   NA 143 09/20/2021 1108   NA 140 09/08/2016 0755   K 4.0 09/20/2021 1108   K 3.9 09/08/2016 0755    CL 106 09/20/2021 1108   CO2 27 09/20/2021 1108   CO2 23 09/08/2016 0755   BUN 11 09/20/2021 1108   BUN 11.2 09/08/2016 0755   CREATININE 0.67 09/20/2021 1108   CREATININE 0.61 09/30/2019 0839   CREATININE 0.7 09/08/2016 0755      Component Value Date/Time   CALCIUM 9.2 09/20/2021 1108   CALCIUM 9.1 09/08/2016 0755   ALKPHOS 67 09/20/2021 1108   ALKPHOS 61 09/08/2016 0755   AST 14 (L) 09/20/2021 1108   AST 13 09/08/2016 0755   ALT 14 09/20/2021 1108   ALT 16 09/08/2016 0755   BILITOT 0.3 09/20/2021 1108   BILITOT 0.42 09/08/2016 0755        Impression and Plan:   61 year old woman with:  1.  Stage IV superficial spreading melanoma with pelvic adenopathy diagnosed in 2022.    She is currently on nivolumab which I recommended continuing at this time.  Risks and benefits of continuing this treatment were discussed at this time.  Potential complications that include immune mediated issues, nausea, fatigue among others were reiterated.  Duration of therapy will be close to 2 years pending her tolerance.  She is agreeable to proceed.  2.  IV access: No issues reported at this time.  Port-A-Cath is deferred for the time being.  3.  Antiemetics: No nausea or vomiting reported at this time.  Compazine is available to her.   4.  Autoimmune complications: I continue to educate her about issues including pneumonitis, colitis and thyroid disease.  She is not experiencing any at this time.   5.  CNS staging: Her last imaging of the brain was in May 2022 and will be updated in 2023.  6.  Hip pain/thigh pain: Stable at this time without any evidence of malignancy.  Continue to monitor.  7.  Treatment goal and prognosis: Therapy is palliative although aggressive measures are warranted given her excellent response.  8.  Follow-up: Next month for repeat follow-up.     30  minutes were dedicated to this visit.  Time spent on reviewing laboratory data, disease status update and  outlining future plan of care discussion.   Zola Button, MD 10/18/2021 10:00 AM

## 2021-10-18 NOTE — Patient Instructions (Signed)
[facility]  Discharge Instructions: ?Thank you for choosing [facility] to provide your oncology and hematology care.  ? ?If you have a lab appointment with the Cancer Center, please go directly to the Cancer Center and check in at the registration area. ?  ?Wear comfortable clothing and clothing appropriate for easy access to any Portacath or PICC line.  ? ?We strive to give you quality time with your provider. You may need to reschedule your appointment if you arrive late (15 or more minutes).  Arriving late affects you and other patients whose appointments are after yours.  Also, if you miss three or more appointments without notifying the office, you may be dismissed from the clinic at the provider?s discretion.    ?  ?For prescription refill requests, have your pharmacy contact our office and allow 72 hours for refills to be completed.   ? ?Today you received the following chemotherapy and/or immunotherapy agents Opdivo    ?  ?To help prevent nausea and vomiting after your treatment, we encourage you to take your nausea medication as directed. ? ?BELOW ARE SYMPTOMS THAT SHOULD BE REPORTED IMMEDIATELY: ?*FEVER GREATER THAN 100.4 F (38 ?C) OR HIGHER ?*CHILLS OR SWEATING ?*NAUSEA AND VOMITING THAT IS NOT CONTROLLED WITH YOUR NAUSEA MEDICATION ?*UNUSUAL SHORTNESS OF BREATH ?*UNUSUAL BRUISING OR BLEEDING ?*URINARY PROBLEMS (pain or burning when urinating, or frequent urination) ?*BOWEL PROBLEMS (unusual diarrhea, constipation, pain near the anus) ?TENDERNESS IN MOUTH AND THROAT WITH OR WITHOUT PRESENCE OF ULCERS (sore throat, sores in mouth, or a toothache) ?UNUSUAL RASH, SWELLING OR PAIN  ?UNUSUAL VAGINAL DISCHARGE OR ITCHING  ? ?Items with * indicate a potential emergency and should be followed up as soon as possible or go to the Emergency Department if any problems should occur. ? ?Please show the CHEMOTHERAPY ALERT CARD or IMMUNOTHERAPY ALERT CARD at check-in to the  Emergency Department and triage nurse. ? ?Should you have questions after your visit or need to cancel or reschedule your appointment, please contact [facility]  Dept: [phone]  and follow the prompts.  Office hours are 8:00 a.m. to 4:30 p.m. Monday - Friday. Please note that voicemails left after 4:00 p.m. may not be returned until the following business day.  We are closed weekends and major holidays. You have access to a nurse at all times for urgent questions. Please call the main number to the clinic Dept: [phone] and follow the prompts. ? ? ?For any non-urgent questions, you may also contact your provider using MyChart. We now offer e-Visits for anyone 18 and older to request care online for non-urgent symptoms. For details visit [email or web address]. ?  ?Also download the MyChart app! Go to the app store, search "MyChart", open the app, select [facility], and log in with your MyChart username and password. ? ?Due to Covid, a mask is required upon entering the hospital/clinic. If you do not have a mask, one will be given to you upon arrival. For doctor visits, patients may have 1 support person aged 18 or older with them. For treatment visits, patients cannot have anyone with them due to current Covid guidelines and our immunocompromised population.  ? ?

## 2021-11-01 DIAGNOSIS — D2261 Melanocytic nevi of right upper limb, including shoulder: Secondary | ICD-10-CM | POA: Diagnosis not present

## 2021-11-01 DIAGNOSIS — D485 Neoplasm of uncertain behavior of skin: Secondary | ICD-10-CM | POA: Diagnosis not present

## 2021-11-01 DIAGNOSIS — L820 Inflamed seborrheic keratosis: Secondary | ICD-10-CM | POA: Diagnosis not present

## 2021-11-01 DIAGNOSIS — D225 Melanocytic nevi of trunk: Secondary | ICD-10-CM | POA: Diagnosis not present

## 2021-11-01 DIAGNOSIS — D2262 Melanocytic nevi of left upper limb, including shoulder: Secondary | ICD-10-CM | POA: Diagnosis not present

## 2021-11-01 DIAGNOSIS — Z85820 Personal history of malignant melanoma of skin: Secondary | ICD-10-CM | POA: Diagnosis not present

## 2021-11-15 ENCOUNTER — Other Ambulatory Visit: Payer: Self-pay

## 2021-11-15 ENCOUNTER — Inpatient Hospital Stay: Payer: BC Managed Care – PPO

## 2021-11-15 ENCOUNTER — Inpatient Hospital Stay: Payer: BC Managed Care – PPO | Admitting: Oncology

## 2021-11-15 VITALS — BP 94/71 | HR 81 | Temp 97.9°F | Resp 17 | Ht 65.0 in | Wt 148.2 lb

## 2021-11-15 DIAGNOSIS — C439 Malignant melanoma of skin, unspecified: Secondary | ICD-10-CM

## 2021-11-15 DIAGNOSIS — Z79899 Other long term (current) drug therapy: Secondary | ICD-10-CM | POA: Diagnosis not present

## 2021-11-15 DIAGNOSIS — Z5112 Encounter for antineoplastic immunotherapy: Secondary | ICD-10-CM | POA: Diagnosis not present

## 2021-11-15 DIAGNOSIS — C4370 Malignant melanoma of unspecified lower limb, including hip: Secondary | ICD-10-CM | POA: Diagnosis not present

## 2021-11-15 LAB — CBC WITH DIFFERENTIAL (CANCER CENTER ONLY)
Abs Immature Granulocytes: 0.01 10*3/uL (ref 0.00–0.07)
Basophils Absolute: 0 10*3/uL (ref 0.0–0.1)
Basophils Relative: 1 %
Eosinophils Absolute: 0.1 10*3/uL (ref 0.0–0.5)
Eosinophils Relative: 2 %
HCT: 41.7 % (ref 36.0–46.0)
Hemoglobin: 13.7 g/dL (ref 12.0–15.0)
Immature Granulocytes: 0 %
Lymphocytes Relative: 34 %
Lymphs Abs: 1.5 10*3/uL (ref 0.7–4.0)
MCH: 28 pg (ref 26.0–34.0)
MCHC: 32.9 g/dL (ref 30.0–36.0)
MCV: 85.1 fL (ref 80.0–100.0)
Monocytes Absolute: 0.4 10*3/uL (ref 0.1–1.0)
Monocytes Relative: 8 %
Neutro Abs: 2.5 10*3/uL (ref 1.7–7.7)
Neutrophils Relative %: 55 %
Platelet Count: 222 10*3/uL (ref 150–400)
RBC: 4.9 MIL/uL (ref 3.87–5.11)
RDW: 13.7 % (ref 11.5–15.5)
WBC Count: 4.5 10*3/uL (ref 4.0–10.5)
nRBC: 0 % (ref 0.0–0.2)

## 2021-11-15 LAB — CMP (CANCER CENTER ONLY)
ALT: 15 U/L (ref 0–44)
AST: 13 U/L — ABNORMAL LOW (ref 15–41)
Albumin: 4.3 g/dL (ref 3.5–5.0)
Alkaline Phosphatase: 59 U/L (ref 38–126)
Anion gap: 8 (ref 5–15)
BUN: 13 mg/dL (ref 8–23)
CO2: 28 mmol/L (ref 22–32)
Calcium: 9.6 mg/dL (ref 8.9–10.3)
Chloride: 105 mmol/L (ref 98–111)
Creatinine: 0.52 mg/dL (ref 0.44–1.00)
GFR, Estimated: >60 mL/min (ref >60)
Glucose, Bld: 90 mg/dL (ref 70–99)
Potassium: 4.1 mmol/L (ref 3.5–5.1)
Sodium: 141 mmol/L (ref 135–145)
Total Bilirubin: 0.4 mg/dL (ref 0.3–1.2)
Total Protein: 7.4 g/dL (ref 6.5–8.1)

## 2021-11-15 LAB — TSH: TSH: 0.802 u[IU]/mL (ref 0.308–3.960)

## 2021-11-15 MED ORDER — SODIUM CHLORIDE 0.9 % IV SOLN
480.0000 mg | Freq: Once | INTRAVENOUS | Status: AC
  Administered 2021-11-15: 480 mg via INTRAVENOUS
  Filled 2021-11-15: qty 48

## 2021-11-15 MED ORDER — SODIUM CHLORIDE 0.9 % IV SOLN: Freq: Once | INTRAVENOUS | Status: AC

## 2021-11-15 NOTE — Patient Instructions (Signed)
[facility]  Discharge Instructions: ?Thank you for choosing [facility] to provide your oncology and hematology care.  ? ?If you have a lab appointment with the Cancer Center, please go directly to the Cancer Center and check in at the registration area. ?  ?Wear comfortable clothing and clothing appropriate for easy access to any Portacath or PICC line.  ? ?We strive to give you quality time with your provider. You may need to reschedule your appointment if you arrive late (15 or more minutes).  Arriving late affects you and other patients whose appointments are after yours.  Also, if you miss three or more appointments without notifying the office, you may be dismissed from the clinic at the provider?s discretion.    ?  ?For prescription refill requests, have your pharmacy contact our office and allow 72 hours for refills to be completed.   ? ?Today you received the following chemotherapy and/or immunotherapy agents Opdivo    ?  ?To help prevent nausea and vomiting after your treatment, we encourage you to take your nausea medication as directed. ? ?BELOW ARE SYMPTOMS THAT SHOULD BE REPORTED IMMEDIATELY: ?*FEVER GREATER THAN 100.4 F (38 ?C) OR HIGHER ?*CHILLS OR SWEATING ?*NAUSEA AND VOMITING THAT IS NOT CONTROLLED WITH YOUR NAUSEA MEDICATION ?*UNUSUAL SHORTNESS OF BREATH ?*UNUSUAL BRUISING OR BLEEDING ?*URINARY PROBLEMS (pain or burning when urinating, or frequent urination) ?*BOWEL PROBLEMS (unusual diarrhea, constipation, pain near the anus) ?TENDERNESS IN MOUTH AND THROAT WITH OR WITHOUT PRESENCE OF ULCERS (sore throat, sores in mouth, or a toothache) ?UNUSUAL RASH, SWELLING OR PAIN  ?UNUSUAL VAGINAL DISCHARGE OR ITCHING  ? ?Items with * indicate a potential emergency and should be followed up as soon as possible or go to the Emergency Department if any problems should occur. ? ?Please show the CHEMOTHERAPY ALERT CARD or IMMUNOTHERAPY ALERT CARD at check-in to the  Emergency Department and triage nurse. ? ?Should you have questions after your visit or need to cancel or reschedule your appointment, please contact [facility]  Dept: [phone]  and follow the prompts.  Office hours are 8:00 a.m. to 4:30 p.m. Monday - Friday. Please note that voicemails left after 4:00 p.m. may not be returned until the following business day.  We are closed weekends and major holidays. You have access to a nurse at all times for urgent questions. Please call the main number to the clinic Dept: [phone] and follow the prompts. ? ? ?For any non-urgent questions, you may also contact your provider using MyChart. We now offer e-Visits for anyone 18 and older to request care online for non-urgent symptoms. For details visit [email or web address]. ?  ?Also download the MyChart app! Go to the app store, search "MyChart", open the app, select [facility], and log in with your MyChart username and password. ? ?Due to Covid, a mask is required upon entering the hospital/clinic. If you do not have a mask, one will be given to you upon arrival. For doctor visits, patients may have 1 support person aged 18 or older with them. For treatment visits, patients cannot have anyone with them due to current Covid guidelines and our immunocompromised population.  ? ?

## 2021-11-15 NOTE — Progress Notes (Signed)
Hematology and Oncology Follow Up  Sharon Kirk [id number] [birth year] 61 y.o. 11/15/2021 8:20 AM [name], MD       Principle Diagnosis: 61 year old woman with stage IV melanoma presented with abdominal adenopathy in May 2022.  She was found to have T2bN1 superficial spreading tumor in the lower extremity diagnosed in 2017.   Secondary diagnoses:    1. Superficial spreading melanoma of the lower extremity diagnosed in August 2017. She was found to have 1.25 mm thickness without ulceration or lymphovascular invasion. She had 1 out of 2 lymph nodes positive. The size of the lymph node measuring 0.25 x 0.95 mm with pathological staging T2bN1.    2. Superficial spreading melanoma of the right shoulder diagnosed in August 2017.She is status post wide local excision and found tohave a 0.72 mm. These findings indicate a pathological staging of T1 NA.     Prior Therapy: She is status post local excision followed by sentinel lymph node sampling with 2 lymph nodes sampled one was involved with metastatic melanoma. The measurement was 0.25 x 0.95 mm in dimension of the capsule. Her final pathological stage stage is T2bN1. This was completed on 05/23/2016.    Nivolumab total of 240 mg dose every 2 weeks for total of 4 doses. Started on 07/21/2016.  She completed 4 cycles of therapy for a total of 2 months out of planned 12.  Patient opted out continuing immunotherapy.  She is status post lymph node biopsy completed on Feb 15, 2021.  Ipilimumab 3 mg/kg with a nivolumab at 1 mg/kg started on Mar 04, 2021.  She is status post 4 cycles of therapy.   Current therapy:   Nivolumab 480 mg every 4 weeks started on May 27, 2021.  She turns for the subsequent cycle of therapy.  Interim History: Mrs. Mckendry returns today for repeat evaluation.  Since the last visit, she reports no major changes in her health.  She continues to tolerate nivolumab without any recent complaints.  She  denies any nausea, vomiting, diarrhea or shortness of breath.  She has reported occasional pruritus that lasts for 1 day.  She continues to have left hip discomfort which has not improved at this time.  It is not interfering with function and not interfering with ability to ambulate.  She rarely takes Tylenol for this discomfort.   Medications: Reviewed without changes. Current Outpatient Medications  Medication Sig Dispense Refill   Biotin 1000 MCG CHEW Chew by mouth.     calcium carbonate (OS-CAL) 600 MG TABS Take 600 mg by mouth 2 (two) times daily with a meal.     cholecalciferol (VITAMIN D) 1000 UNITS tablet Take 5,000 Units by mouth daily.     Cyanocobalamin (VITAMIN B-12 CR PO) Take 1 tablet by mouth daily.     No current facility-administered medications for this visit.     Allergies:  Allergies  Allergen Reactions   Codeine Nausea Only and Other (See Comments)    Hallucinations; confusion   Physical exam      Blood pressure 94/71, pulse 81, temperature 97.9 F (36.6 C), temperature source Temporal, resp. rate 17, height _0  (1.651 m), weight 148 lb 3.2 oz (67.2 kg), last menstrual period 06/30/1990, SpO2 99 %.     ECOG 0   General appearance: Comfortable appearing without any discomfort Head: Normocephalic without any trauma Oropharynx: Mucous membranes are moist and pink without any thrush or ulcers. Eyes: Pupils are equal and round reactive to light. Lymph  nodes: No cervical, supraclavicular, inguinal or axillary lymphadenopathy.   Heart:regular rate and rhythm.  S1 and S2 without leg edema. Lung: Clear without any rhonchi or wheezes.  No dullness to percussion. Abdomin: Soft, nontender, nondistended with good bowel sounds.  No hepatosplenomegaly. Musculoskeletal: Full range of motion in her left hip slight discomfort noted. Neurological: No deficits noted on motor, sensory and deep tendon reflex exam. Skin: No petechial rash or dryness.  Appeared moist.                 Lab Results: Lab Results  Component Value Date   WBC 4.6 10/18/2021   HGB 13.2 10/18/2021   HCT 39.6 10/18/2021   MCV 84.6 10/18/2021   PLT 215 10/18/2021     Chemistry      Component Value Date/Time   NA 140 10/18/2021 0939   NA 140 09/08/2016 0755   K 4.1 10/18/2021 0939   K 3.9 09/08/2016 0755   CL 106 10/18/2021 0939   CO2 26 10/18/2021 0939   CO2 23 09/08/2016 0755   BUN 12 10/18/2021 0939   BUN 11.2 09/08/2016 0755   CREATININE 0.49 10/18/2021 0939   CREATININE 0.61 09/30/2019 0839   CREATININE 0.7 09/08/2016 0755      Component Value Date/Time   CALCIUM 9.5 10/18/2021 0939   CALCIUM 9.1 09/08/2016 0755   ALKPHOS 65 10/18/2021 0939   ALKPHOS 61 09/08/2016 0755   AST 12 (L) 10/18/2021 0939   AST 13 09/08/2016 0755   ALT 13 10/18/2021 0939   ALT 16 09/08/2016 0755   BILITOT 0.4 10/18/2021 0939   BILITOT 0.42 09/08/2016 0755        Impression and Plan:   61 year old woman with:  1.  Melanoma of the lower extremity diagnosed in 2017.  She developed stage IV superficial spreading subtype with pelvic adenopathy in 2022.    Her disease status was updated at this time and treatment choices were reviewed.  Risks and benefits of continuing immunotherapy were reviewed at this time.  Complications include nausea, fatigue and immune mediated issues were discussed.  Alternative treatment options including active surveillance or BRAF targeted therapy if she harbors appropriate mutation.  She is agreeable to continue at this time.  2.  IV access: No issues reported with peripheral veins.  Port-A-Cath option has been deferred.  3.  Antiemetics: No nausea or vomiting reported at this time.  Compazine is available to her.   4.  Autoimmune complications: She has not experienced any autoimmune issues.  Pneumonitis, colitis and thyroid disease were reiterated.  5.  CNS staging: We will repeat MRI of the brain May 2023.  6.  Hip pain/thigh pain:  Unclear etiology and does not appear to be improving.  We will obtain imaging studies of the left hip to rule out metastatic disease.  7.  Treatment goal and prognosis: Her disease is incurable but she has experienced excellent benefit to immunotherapy and the plan is to continue.  8.  Follow-up: In 4 weeks for repeat evaluation.     30  minutes were spent on this encounter.  The time was dedicated to reviewing laboratory data, disease status update and treatment choices for the future.   Zola Button, MD 11/15/2021 8:20 AM

## 2021-11-16 DIAGNOSIS — R7309 Other abnormal glucose: Secondary | ICD-10-CM | POA: Diagnosis not present

## 2021-11-22 ENCOUNTER — Ambulatory Visit (HOSPITAL_COMMUNITY)
Admission: RE | Admit: 2021-11-22 | Discharge: 2021-11-22 | Disposition: A | Discharge status: Home / Self Care | Payer: BC Managed Care – PPO | Source: Ambulatory Visit | Attending: Oncology | Admitting: Oncology

## 2021-11-22 DIAGNOSIS — C439 Malignant melanoma of skin, unspecified: Secondary | ICD-10-CM | POA: Insufficient documentation

## 2021-11-22 IMAGING — MR MR HIP*L* WO/W CM
9 series · 40 of 40 positions shown · IV contrast (gadavist)
Comparison: CT abdomen and pelvis [DATE], CT chest abdomen and
pelvis [DATE]

CLINICAL DATA: Musculoskeletal neoplasm. Assess for treatment
response. Metastatic melanoma.

EXAM:
MRI OF THE LEFT HIP WITHOUT AND WITH CONTRAST
TECHNIQUE: Multiplanar, multisequence MR imaging was performed both before and
after administration of intravenous contrast.
CONTRAST:  7mL GADAVIST GADOBUTROL 1 MMOL/ML IV SOLN

[Series 8: T1 · coronal · left · 3.0mm · 0.89mm/px · 5 of 26 slices shown]
[im 1/26]
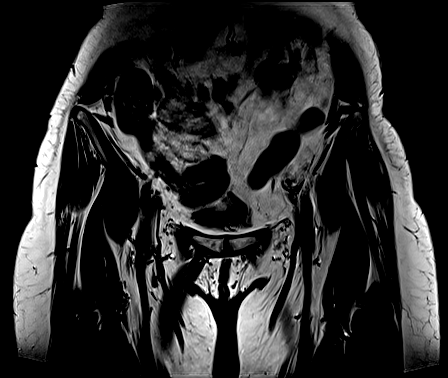
[im 7/26]
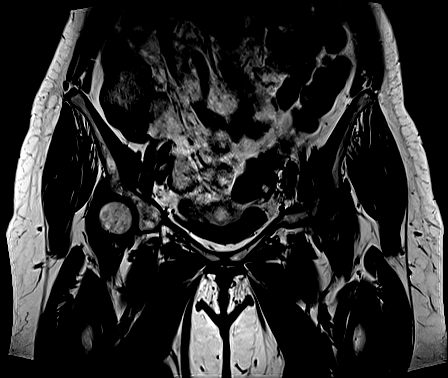
[im 13/26]
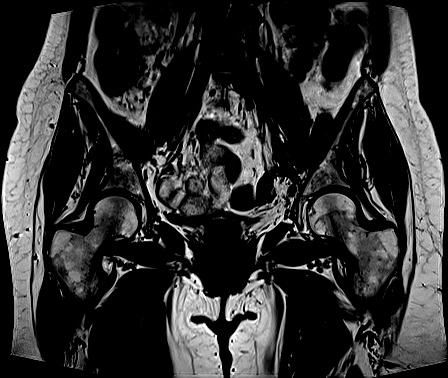
[im 19/26]
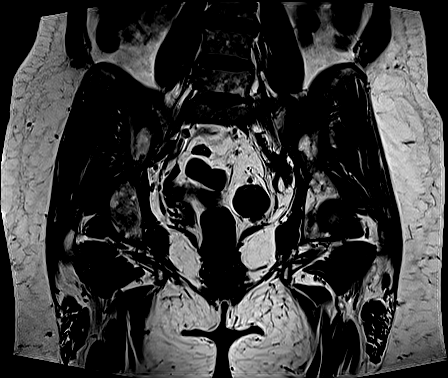
[im 26/26]
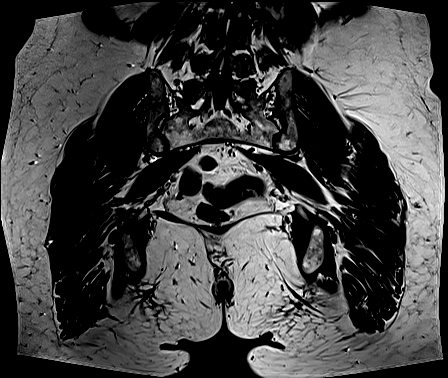

[Series 9: T2 fat-sat · coronal · left · 3.0mm · 1.56mm/px · 4 of 26 slices shown (1 of 2)]
[im 1/26]
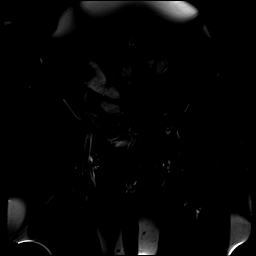
[im 9/26]
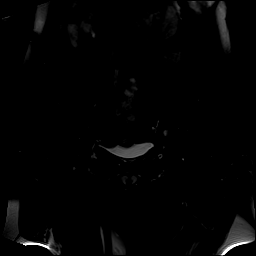
[im 17/26]
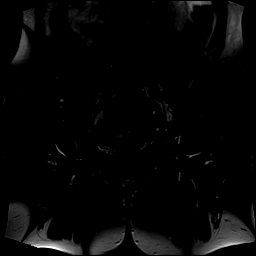
[im 26/26]
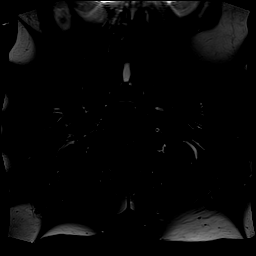

[Series 10: T2 fat-sat · axial · left · 4.0mm · 1.25mm/px · z∈[-169,+25]mm · 7 of 40 slices shown (2 of 2)]
[im 1/40]
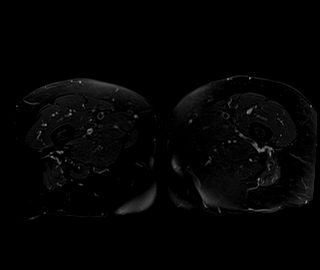
[im 7/40]
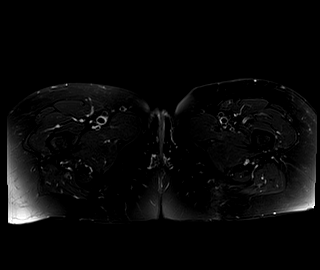
[im 14/40]
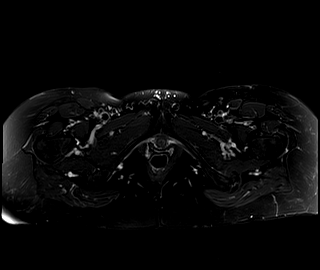
[im 20/40]
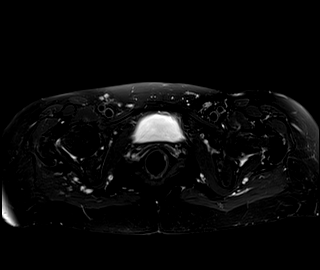
[im 27/40]
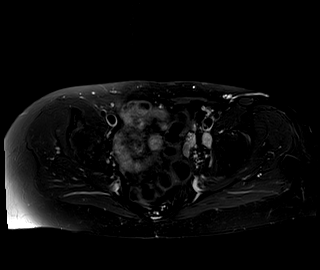
[im 33/40]
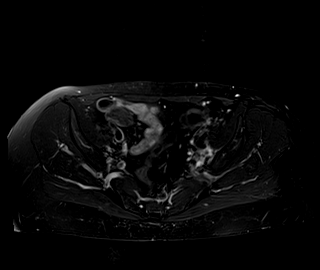
[im 40/40]
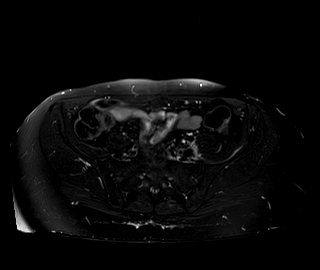

[Series 11: PD fat-sat · coronal · left · 4.0mm · 0.62mm/px · 4 of 22 slices shown (1 of 2)]
[im 1/22]
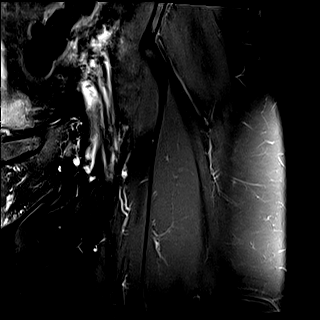
[im 8/22]
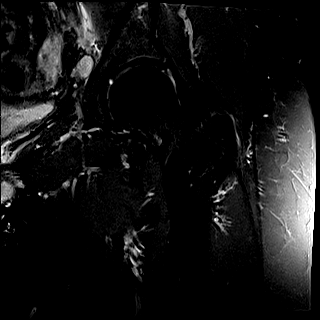
[im 15/22]
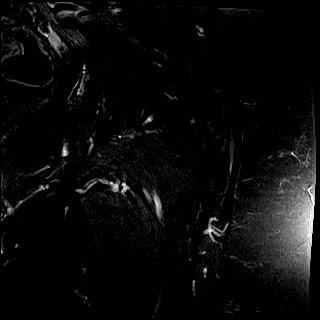
[im 22/22]
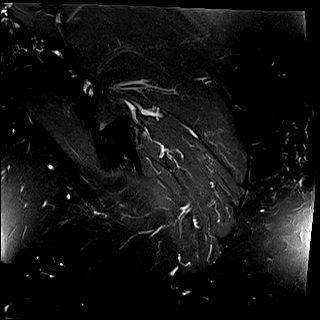

[Series 12: PD · oblique · left · 4.0mm · 0.70mm/px · 4 of 23 slices shown]
[im 1/23]
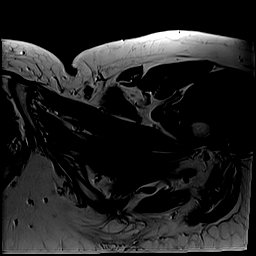
[im 8/23]
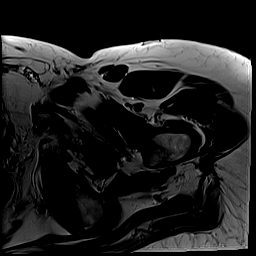
[im 15/23]
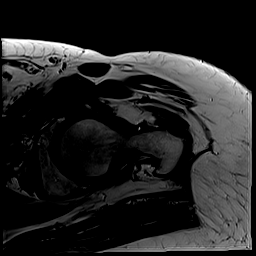
[im 23/23]
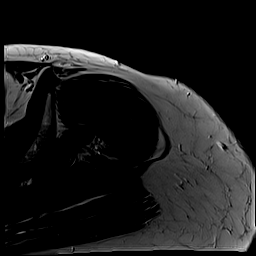

[Series 13: PD fat-sat · sagittal · left · 4.0mm · 0.56mm/px · 4 of 24 slices shown (2 of 2)]
[im 1/24]
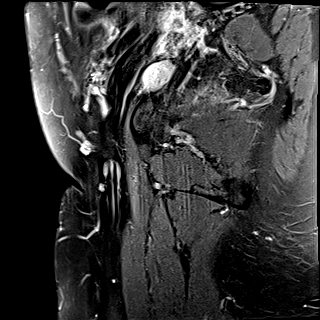
[im 8/24]
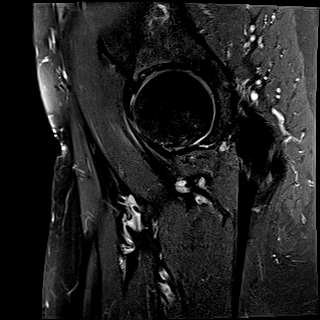
[im 16/24]
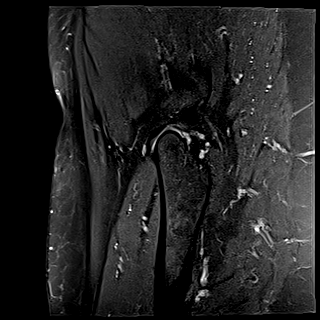
[im 24/24]
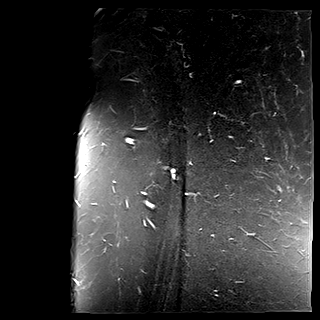

[Series 14: T1 fat-sat · axial · non-contrast · left · 4.0mm · 0.86mm/px · z∈[-133,-8]mm · 4 of 26 slices shown (1 of 2)]
[im 1/26]
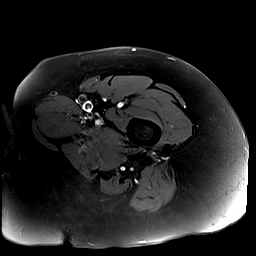
[im 9/26]
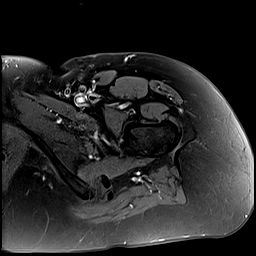
[im 17/26]
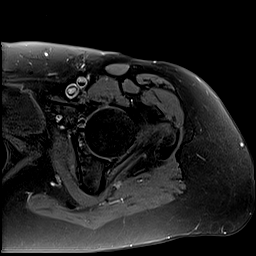
[im 26/26]
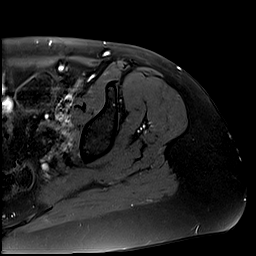

[Series 16: T1 fat-sat · coronal · left · 4.0mm · 0.39mm/px · 4 of 22 slices shown (2 of 2)]
[im 1/22]
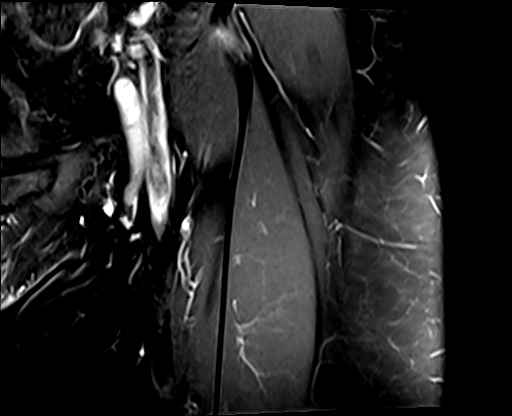
[im 8/22]
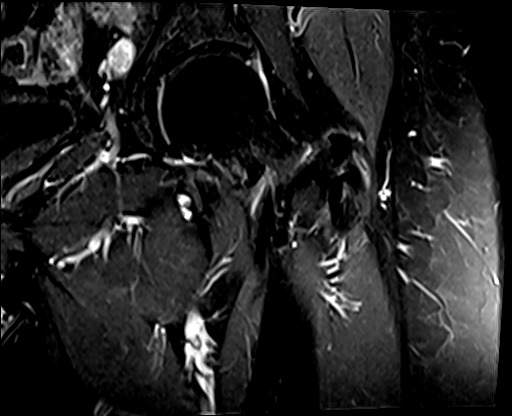
[im 15/22]
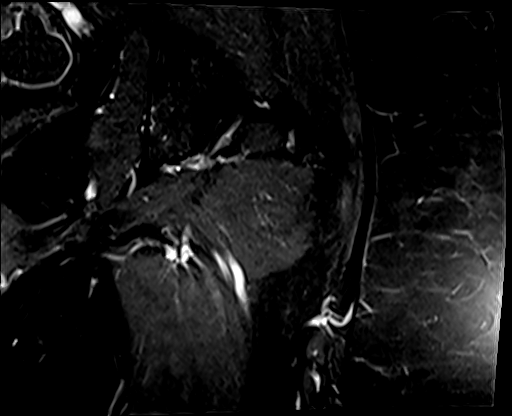
[im 22/22]
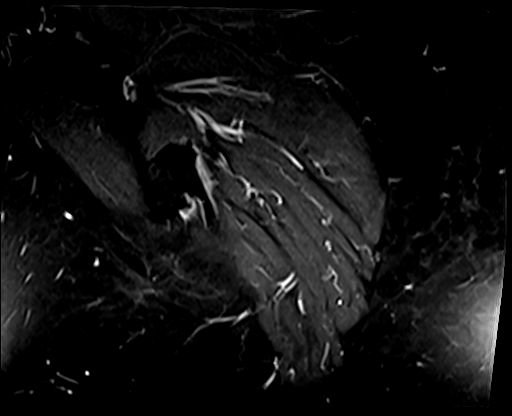

[Series 17: T1 fat-sat post-contrast · axial · left · 4.0mm · 0.86mm/px · z∈[-133,-9]mm · 4 of 26 slices shown]
[im 1/26]
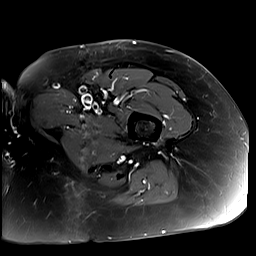
[im 9/26]
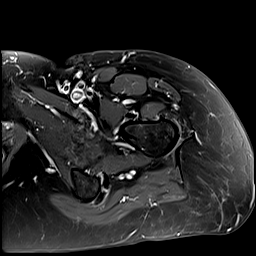
[im 17/26]
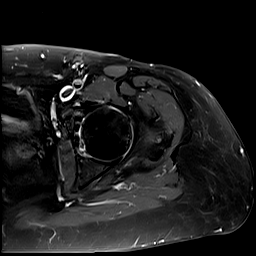
[im 26/26]
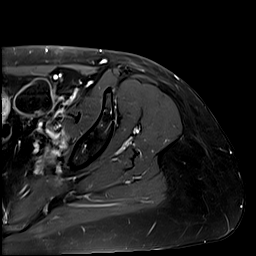

[40 of 40 positions shown; findings below may reference images not displayed]

FINDINGS: Bones: No acute fracture or avascular necrosis is seen within the
visualized portions of the pelvis or either proximal femur. No
destructive bone lesion is identified.

Articular cartilage and labrum

Articular cartilage: Mild superior left femoral head and acetabular
cartilage thinning. Moderate anterior superior cartilage thinning
with mild anterior superior acetabular subchondral cystic change.

Labrum: Degenerative attenuation of the anterior superior left
acetabular labrum.

Joint or bursal effusion

Joint effusion:  No joint effusion in either hip.

Bursae: Minimal left trochanteric bursitis.

Muscles and tendons

Muscles and tendons: The origins of the bilateral sartorius tendons
are intact. Mild edema deep to the junction of the straight head and
reflected lateral head of the left rectus femoris tendon (axial
series 10, image 11) mild origin tendinosis. The right rectus
femoris tendon origin is intact. Minimal edema bordering the
posteromedial aspect of the right common hamstring tendon origin
(axial image 32 and coronal image 5), mild tendinosis. The left
common hamstring origin is intact. The bilateral iliopsoas tendon
insertions are intact. Mild edema around the bilateral gluteus
minimus tendon insertions. The bilateral gluteus medius tendon
insertions are intact. The rectus abdominis-adductor aponeuroses are
intact.

Other findings

Miscellaneous: The uterus appears to be surgically absent. Near
resolution of the prior bulky lymphadenopathy seen within the left
pelvic sidewall on CT abdomen and pelvis [DATE], on that prior
CT extending more superiorly around the left abdominal aorta. There
is a 12 mm short axis lymph node at the superior left pelvic
sidewall (axial 10, image 14, coronal series 8, image 16).
IMPRESSION: :
IMPRESSION: 1. Note is made of bulky lymphadenopathy throughout the
retroperitoneal space of the left hemipelvis on prior [DATE] CT.
The portion of the lymphadenopathy within the visualized portion of
the pelvis on CT abdomen pelvis [DATE] again has markedly
improved. There is a 12 mm short axis left external iliac chain
lymph node that is unchanged compared to [DATE].
2. No destructive bone lesion is visualized.
3. Mild-to-moderate left femoroacetabular osteoarthritis.
4. Mild left rectus femoris origin, minimal right common hamstring
tendon origin, and mild bilateral gluteus minimus insertional
tendinosis.

## 2021-11-22 MED ORDER — GADOBUTROL 1 MMOL/ML IV SOLN
7.0000 mL | Freq: Once | INTRAVENOUS | Status: AC | PRN
  Administered 2021-11-22: 7 mL via INTRAVENOUS

## 2021-11-24 ENCOUNTER — Telehealth: Payer: Self-pay | Admitting: *Deleted

## 2021-11-24 NOTE — Telephone Encounter (Signed)
Notified of message below

## 2021-11-24 NOTE — Telephone Encounter (Signed)
-----   Message from Wyatt Portela, MD sent at 11/24/2021  8:42 AM EST ----- Please let her know no the MRI shows no cancer. Just arthritis.

## 2021-11-24 NOTE — Telephone Encounter (Signed)
LM to call Dr Hazeline Junker office

## 2021-11-25 ENCOUNTER — Telehealth: Payer: Self-pay | Admitting: Oncology

## 2021-11-25 NOTE — Telephone Encounter (Signed)
Scheduled per 01/31 los, patient has been called and notified of upcoming appointments. °

## 2021-12-13 ENCOUNTER — Inpatient Hospital Stay: Payer: BC Managed Care – PPO | Attending: Oncology

## 2021-12-13 ENCOUNTER — Inpatient Hospital Stay (HOSPITAL_BASED_OUTPATIENT_CLINIC_OR_DEPARTMENT_OTHER): Payer: BC Managed Care – PPO | Admitting: Oncology

## 2021-12-13 ENCOUNTER — Inpatient Hospital Stay: Payer: BC Managed Care – PPO

## 2021-12-13 ENCOUNTER — Other Ambulatory Visit: Payer: Self-pay

## 2021-12-13 VITALS — BP 97/75 | HR 66 | Temp 97.7°F | Resp 16 | Wt 150.5 lb

## 2021-12-13 DIAGNOSIS — Z5112 Encounter for antineoplastic immunotherapy: Secondary | ICD-10-CM | POA: Insufficient documentation

## 2021-12-13 DIAGNOSIS — C439 Malignant melanoma of skin, unspecified: Secondary | ICD-10-CM

## 2021-12-13 DIAGNOSIS — Z79899 Other long term (current) drug therapy: Secondary | ICD-10-CM | POA: Insufficient documentation

## 2021-12-13 DIAGNOSIS — C4370 Malignant melanoma of unspecified lower limb, including hip: Secondary | ICD-10-CM | POA: Insufficient documentation

## 2021-12-13 LAB — CMP (CANCER CENTER ONLY)
ALT: 16 U/L (ref 0–44)
AST: 13 U/L — ABNORMAL LOW (ref 15–41)
Albumin: 4.5 g/dL (ref 3.5–5.0)
Alkaline Phosphatase: 70 U/L (ref 38–126)
Anion gap: 5 (ref 5–15)
BUN: 12 mg/dL (ref 8–23)
CO2: 31 mmol/L (ref 22–32)
Calcium: 9.7 mg/dL (ref 8.9–10.3)
Chloride: 104 mmol/L (ref 98–111)
Creatinine: 0.52 mg/dL (ref 0.44–1.00)
GFR, Estimated: >60 mL/min (ref >60)
Glucose, Bld: 79 mg/dL (ref 70–99)
Potassium: 4 mmol/L (ref 3.5–5.1)
Sodium: 140 mmol/L (ref 135–145)
Total Bilirubin: 0.3 mg/dL (ref 0.3–1.2)
Total Protein: 7.5 g/dL (ref 6.5–8.1)

## 2021-12-13 LAB — CBC WITH DIFFERENTIAL (CANCER CENTER ONLY)
Abs Immature Granulocytes: 0.01 10*3/uL (ref 0.00–0.07)
Basophils Absolute: 0 10*3/uL (ref 0.0–0.1)
Basophils Relative: 1 %
Eosinophils Absolute: 0.1 10*3/uL (ref 0.0–0.5)
Eosinophils Relative: 3 %
HCT: 43.2 % (ref 36.0–46.0)
Hemoglobin: 14 g/dL (ref 12.0–15.0)
Immature Granulocytes: 0 %
Lymphocytes Relative: 43 %
Lymphs Abs: 1.9 10*3/uL (ref 0.7–4.0)
MCH: 27.8 pg (ref 26.0–34.0)
MCHC: 32.4 g/dL (ref 30.0–36.0)
MCV: 85.9 fL (ref 80.0–100.0)
Monocytes Absolute: 0.4 10*3/uL (ref 0.1–1.0)
Monocytes Relative: 8 %
Neutro Abs: 2 10*3/uL (ref 1.7–7.7)
Neutrophils Relative %: 45 %
Platelet Count: 228 10*3/uL (ref 150–400)
RBC: 5.03 MIL/uL (ref 3.87–5.11)
RDW: 13.5 % (ref 11.5–15.5)
WBC Count: 4.4 10*3/uL (ref 4.0–10.5)
nRBC: 0 % (ref 0.0–0.2)

## 2021-12-13 LAB — TSH: TSH: 1.123 u[IU]/mL (ref 0.308–3.960)

## 2021-12-13 MED ORDER — SODIUM CHLORIDE 0.9 % IV SOLN: Freq: Once | INTRAVENOUS | Status: AC

## 2021-12-13 MED ORDER — SODIUM CHLORIDE 0.9 % IV SOLN
480.0000 mg | Freq: Once | INTRAVENOUS | Status: AC
  Administered 2021-12-13: 480 mg via INTRAVENOUS
  Filled 2021-12-13: qty 48

## 2021-12-13 NOTE — Patient Instructions (Signed)
Nivolumab injection °What is this medication? °NIVOLUMAB (nye VOL ue mab) is a monoclonal antibody. It treats certain types of cancer. Some of the cancers treated are colon cancer, head and neck cancer, Hodgkin lymphoma, lung cancer, and melanoma. °This medicine may be used for other purposes; ask your health care provider or pharmacist if you have questions. °COMMON BRAND NAME(S): Opdivo °What should I tell my care team before I take this medication? °They need to know if you have any of these conditions: °Autoimmune diseases such as Crohn's disease, ulcerative colitis, or lupus °Have had or planning to have an allogeneic stem cell transplant (uses someone else's stem cells) °History of chest radiation °Organ transplant °Nervous system problems such as myasthenia gravis or Guillain-Barre syndrome °An unusual or allergic reaction to nivolumab, other medicines, foods, dyes, or preservatives °Pregnant or trying to get pregnant °Breast-feeding °How should I use this medication? °This medication is injected into a vein. It is given in a hospital or clinic setting. °A special MedGuide will be given to you before each treatment. Be sure to read this information carefully each time. °Talk to your care team regarding the use of this medication in children. While it may be prescribed for children as young as 12 years for selected conditions, precautions do apply. °Overdosage: If you think you have taken too much of this medicine contact a poison control center or emergency room at once. °NOTE: This medicine is only for you. Do not share this medicine with others. °What if I miss a dose? °Keep appointments for follow-up doses. It is important not to miss your dose. Call your care team if you are unable to keep an appointment. °What may interact with this medication? °Interactions have not been studied. °This list may not describe all possible interactions. Give your health care provider a list of all the medicines, herbs,  non-prescription drugs, or dietary supplements you use. Also tell them if you smoke, drink alcohol, or use illegal drugs. Some items may interact with your medicine. °What should I watch for while using this medication? °Your condition will be monitored carefully while you are receiving this medication. °You may need blood work done while you are taking this medication. °Do not become pregnant while taking this medication or for 5 months after stopping it. Women should inform their care team if they wish to become pregnant or think they might be pregnant. There is a potential for serious harm to an unborn child. Talk to your care team for more information. Do not breast-feed an infant while taking this medication or for 5 months after stopping it. °What side effects may I notice from receiving this medication? °Side effects that you should report to your care team as soon as possible: °Allergic reactions--skin rash, itching, hives, swelling of the face, lips, tongue, or throat °Bloody or black, tar-like stools °Change in vision °Chest pain °Diarrhea °Dry cough, shortness of breath or trouble breathing °Eye pain °Fast or irregular heartbeat °Fever, chills °High blood sugar (hyperglycemia)--increased thirst or amount of urine, unusual weakness or fatigue, blurry vision °High thyroid levels (hyperthyroidism)--fast or irregular heartbeat, weight loss, excessive sweating or sensitivity to heat, tremors or shaking, anxiety, nervousness, irregular menstrual cycle or spotting °Kidney injury--decrease in the amount of urine, swelling of the ankles, hands, or feet °Liver injury--right upper belly pain, loss of appetite, nausea, light-colored stool, dark yellow or brown urine, yellowing skin or eyes, unusual weakness or fatigue °Low red blood cell count--unusual weakness or fatigue, dizziness, headache, trouble breathing °  Low thyroid levels (hypothyroidism)--unusual weakness or fatigue, increased sensitivity to cold,  constipation, hair loss, dry skin, weight gain, feelings of depression °Mood and behavior changes-confusion, change in sex drive or performance, irritability °Muscle pain or cramps °Pain, tingling, or numbness in the hands or feet, muscle weakness, trouble walking, loss of balance or coordination °Red or dark brown urine °Redness, blistering, peeling, or loosening of the skin, including inside the mouth °Stomach pain °Unusual bruising or bleeding °Side effects that usually do not require medical attention (report to your care team if they continue or are bothersome): °Bone pain °Constipation °Loss of appetite °Nausea °Tiredness °Vomiting °This list may not describe all possible side effects. Call your doctor for medical advice about side effects. You may report side effects to FDA at 1-800-FDA-1088. °Where should I keep my medication? °This medication is given in a hospital or clinic and will not be stored at home. °NOTE: This sheet is a summary. It may not cover all possible information. If you have questions about this medicine, talk to your doctor, pharmacist, or health care provider. °© 2022 [employer] (2021-06-21 00:00:00) ° °

## 2021-12-13 NOTE — Progress Notes (Signed)
Hematology and Oncology Follow Up  Sharon Kirk [id number] Jun 30, 1961 61 y.o. 12/13/2021 11:45 AM Sharon Face, MD       Principle Diagnosis: 61 year old woman with T2BN1 melanoma of the lower extremity diagnosed in 2017.  She developed stage IV superficial spreading subtype with abdominal adenopathy in May 2022.   Secondary diagnoses:    1. Superficial spreading melanoma of the lower extremity diagnosed in August 2017. She was found to have 1.25 mm thickness without ulceration or lymphovascular invasion. She had 1 out of 2 lymph nodes positive. The size of the lymph node measuring 0.25 x 0.95 mm with pathological staging T2bN1.    2. Superficial spreading melanoma of the right shoulder diagnosed in August 2017.She is status post wide local excision and found tohave a 0.72 mm. These findings indicate a pathological staging of T1 NA.     Prior Therapy: She is status post local excision followed by sentinel lymph node sampling with 2 lymph nodes sampled one was involved with metastatic melanoma. The measurement was 0.25 x 0.95 mm in dimension of the capsule. Her final pathological stage stage is T2bN1. This was completed on 05/23/2016.    Nivolumab total of 240 mg dose every 2 weeks for total of 4 doses. Started on 07/21/2016.  She completed 4 cycles of therapy for a total of 2 months out of planned 12.  Patient opted out continuing immunotherapy.  She is status post lymph node biopsy completed on Feb 15, 2021.  Ipilimumab 3 mg/kg with a nivolumab at 1 mg/kg started on Mar 04, 2021.  She is status post 4 cycles of therapy.   Current therapy:   Nivolumab 480 mg every 4 weeks started on May 27, 2021.  He is here for the next cycle of therapy.    Interim History: Sharon Kirk presents today for a follow-up evaluation.  Since the last visit, she reports no major changes in her health.  She continues to have pain in the left hip but no major changes.  She is able to  ambulate and attends activities of daily living without any decline.  She denies any hospitalizations or illnesses.  She denies any nausea, fatigue or respiratory distress.  She denies any changes in her bowels.   Medications: Updated on review. Current Outpatient Medications  Medication Sig Dispense Refill   Biotin 1000 MCG CHEW Chew by mouth.     calcium carbonate (OS-CAL) 600 MG TABS Take 600 mg by mouth 2 (two) times daily with a meal.     cholecalciferol (VITAMIN D) 1000 UNITS tablet Take 5,000 Units by mouth daily.     Cyanocobalamin (VITAMIN B-12 CR PO) Take 1 tablet by mouth daily.     No current facility-administered medications for this visit.     Allergies:  Allergies  Allergen Reactions   Codeine Nausea Only and Other (See Comments)    Hallucinations; confusion   Physical exam      Blood pressure 97/75, pulse 66, temperature 97.7 F (36.5 C), temperature source Temporal, resp. rate 16, weight 150 lb 8 oz (68.3 kg), last menstrual period 06/30/1990, SpO2 99 %.      ECOG 0    General appearance: Alert, awake without any distress. Head: Atraumatic without abnormalities Oropharynx: Without any thrush or ulcers. Eyes: No scleral icterus. Lymph nodes: No lymphadenopathy noted in the cervical, supraclavicular, or axillary nodes Heart:regular rate and rhythm, without any murmurs or gallops.   Lung: Clear to auscultation without any rhonchi, wheezes or  dullness to percussion. Abdomin: Soft, nontender without any shifting dullness or ascites. Musculoskeletal: No clubbing or cyanosis. Neurological: No motor or sensory deficits. Skin: No rashes or lesions.                Lab Results: Lab Results  Component Value Date   WBC 4.4 12/13/2021   HGB 14.0 12/13/2021   HCT 43.2 12/13/2021   MCV 85.9 12/13/2021   PLT 228 12/13/2021     Chemistry      Component Value Date/Time   NA 141 11/15/2021 0830   NA 140 09/08/2016 0755   K 4.1 11/15/2021 0830    K 3.9 09/08/2016 0755   CL 105 11/15/2021 0830   CO2 28 11/15/2021 0830   CO2 23 09/08/2016 0755   BUN 13 11/15/2021 0830   BUN 11.2 09/08/2016 0755   CREATININE 0.52 11/15/2021 0830   CREATININE 0.61 09/30/2019 0839   CREATININE 0.7 09/08/2016 0755      Component Value Date/Time   CALCIUM 9.6 11/15/2021 0830   CALCIUM 9.1 09/08/2016 0755   ALKPHOS 59 11/15/2021 0830   ALKPHOS 61 09/08/2016 0755   AST 13 (L) 11/15/2021 0830   AST 13 09/08/2016 0755   ALT 15 11/15/2021 0830   ALT 16 09/08/2016 0755   BILITOT 0.4 11/15/2021 0830   BILITOT 0.42 09/08/2016 0755        Impression and Plan:   61 year old woman with:  1.  Stage IV superficial spreading melanoma of the lower extremity presented with pelvic adenopathy in 2022.    The natural course of her disease was reviewed at this time and treatment choices were discussed.  She continues to tolerate nivolumab reasonably well without any major complications.  Last imaging studies in September 13, 2021 continues to show improvement.  Imaging studies of the left hip did include some of her pelvic adenopathy which continues to improve.  I plan to update her staging scans in May 2023.  Plan is to continue with nivolumab for at least a total of 2 years of immunotherapy.  2.  IV access: Port-A-Cath option has been deferred at this time.  Peripheral veins continues to be in use.  3.  Antiemetics: Compazine is available without any nausea or vomiting.   4.  Autoimmune complications: I continue to educate her about potential complications including pneumonitis, colitis and thyroid disease.  5.  CNS staging: Last MRI of the brain in 2022 showed no evidence of disease and this will be repeated in May 2023.  6.  Hip pain/thigh pain: MRI of the hip showed no evidence of malignancy.  This is related to arthritis predominantly.  7.  Treatment goal and prognosis: Aggressive therapy is warranted.  Her disease is likely incurable  however.  8.  Follow-up: She will return in 1 month for follow-up evaluation.     30  minutes were spent on this visit.  The time was dedicated to reviewing laboratory data, disease status update and outlining future plan of care discussion.   Zola Button, MD 12/13/2021 11:45 AM

## 2021-12-14 DIAGNOSIS — R7309 Other abnormal glucose: Secondary | ICD-10-CM | POA: Diagnosis not present

## 2021-12-21 ENCOUNTER — Telehealth: Payer: Self-pay | Admitting: Oncology

## 2021-12-21 NOTE — Telephone Encounter (Signed)
Scheduled per 02/28 los, patient has been called and notified.  ?

## 2021-12-22 DIAGNOSIS — H524 Presbyopia: Secondary | ICD-10-CM | POA: Diagnosis not present

## 2022-01-10 ENCOUNTER — Inpatient Hospital Stay: Payer: BC Managed Care – PPO

## 2022-01-10 ENCOUNTER — Inpatient Hospital Stay (HOSPITAL_BASED_OUTPATIENT_CLINIC_OR_DEPARTMENT_OTHER): Payer: BC Managed Care – PPO | Admitting: Oncology

## 2022-01-10 ENCOUNTER — Inpatient Hospital Stay: Payer: BC Managed Care – PPO | Attending: Oncology

## 2022-01-10 ENCOUNTER — Other Ambulatory Visit: Payer: Self-pay

## 2022-01-10 VITALS — BP 98/74 | HR 70 | Temp 98.2°F | Wt 152.0 lb

## 2022-01-10 DIAGNOSIS — Z5112 Encounter for antineoplastic immunotherapy: Secondary | ICD-10-CM | POA: Diagnosis not present

## 2022-01-10 DIAGNOSIS — C439 Malignant melanoma of skin, unspecified: Secondary | ICD-10-CM

## 2022-01-10 DIAGNOSIS — C4370 Malignant melanoma of unspecified lower limb, including hip: Secondary | ICD-10-CM | POA: Insufficient documentation

## 2022-01-10 DIAGNOSIS — Z79899 Other long term (current) drug therapy: Secondary | ICD-10-CM | POA: Insufficient documentation

## 2022-01-10 LAB — CBC WITH DIFFERENTIAL (CANCER CENTER ONLY)
Abs Immature Granulocytes: 0 10*3/uL (ref 0.00–0.07)
Basophils Absolute: 0 10*3/uL (ref 0.0–0.1)
Basophils Relative: 1 %
Eosinophils Absolute: 0.1 10*3/uL (ref 0.0–0.5)
Eosinophils Relative: 2 %
HCT: 40.9 % (ref 36.0–46.0)
Hemoglobin: 13.8 g/dL (ref 12.0–15.0)
Immature Granulocytes: 0 %
Lymphocytes Relative: 38 %
Lymphs Abs: 1.5 10*3/uL (ref 0.7–4.0)
MCH: 28.4 pg (ref 26.0–34.0)
MCHC: 33.7 g/dL (ref 30.0–36.0)
MCV: 84.2 fL (ref 80.0–100.0)
Monocytes Absolute: 0.4 10*3/uL (ref 0.1–1.0)
Monocytes Relative: 9 %
Neutro Abs: 2 10*3/uL (ref 1.7–7.7)
Neutrophils Relative %: 50 %
Platelet Count: 219 10*3/uL (ref 150–400)
RBC: 4.86 MIL/uL (ref 3.87–5.11)
RDW: 13.2 % (ref 11.5–15.5)
WBC Count: 3.9 10*3/uL — ABNORMAL LOW (ref 4.0–10.5)
nRBC: 0 % (ref 0.0–0.2)

## 2022-01-10 LAB — CMP (CANCER CENTER ONLY)
ALT: 15 U/L (ref 0–44)
AST: 13 U/L — ABNORMAL LOW (ref 15–41)
Albumin: 4.2 g/dL (ref 3.5–5.0)
Alkaline Phosphatase: 69 U/L (ref 38–126)
Anion gap: 4 — ABNORMAL LOW (ref 5–15)
BUN: 15 mg/dL (ref 8–23)
CO2: 30 mmol/L (ref 22–32)
Calcium: 9.5 mg/dL (ref 8.9–10.3)
Chloride: 105 mmol/L (ref 98–111)
Creatinine: 0.54 mg/dL (ref 0.44–1.00)
GFR, Estimated: >60 mL/min (ref >60)
Glucose, Bld: 84 mg/dL (ref 70–99)
Potassium: 4.5 mmol/L (ref 3.5–5.1)
Sodium: 139 mmol/L (ref 135–145)
Total Bilirubin: 0.4 mg/dL (ref 0.3–1.2)
Total Protein: 7 g/dL (ref 6.5–8.1)

## 2022-01-10 LAB — TSH: TSH: 1.019 u[IU]/mL (ref 0.308–3.960)

## 2022-01-10 MED ORDER — SODIUM CHLORIDE 0.9 % IV SOLN: Freq: Once | INTRAVENOUS | Status: AC

## 2022-01-10 MED ORDER — SODIUM CHLORIDE 0.9 % IV SOLN
480.0000 mg | Freq: Once | INTRAVENOUS | Status: AC
  Administered 2022-01-10: 480 mg via INTRAVENOUS
  Filled 2022-01-10: qty 48

## 2022-01-10 NOTE — Progress Notes (Signed)
Hematology and Oncology Follow Up ? ?Sharon Kirk ?[id number] ?[birth year] 61 y.o. ?01/10/2022 7:43 AM ?[name], MD  ? ? ? ? ? ?Principle Diagnosis: 61 year old woman with stage IV melanoma confirmed in May 2022.  She initially presented with T2BN1 superficial spreading type of the the lower extremity diagnosed in 2017.  ?Secondary diagnoses:  ? ? ?1. Superficial spreading melanoma of the lower extremity diagnosed in August 2017. She was found to have 1.25 mm thickness without ulceration or lymphovascular invasion. She had 1 out of 2 lymph nodes positive. The size of the lymph node measuring 0.25 x 0.95 mm with pathological staging T2bN1.  ?  ?2. Superficial spreading melanoma of the right shoulder diagnosed in August 2017.She is status post wide local excision and found tohave a 0.72 mm. These findings indicate a pathological staging of T1 NA. ?  ?  ?Prior Therapy: ?She is status post local excision followed by sentinel lymph node sampling with 2 lymph nodes sampled one was involved with metastatic melanoma. The measurement was 0.25 x 0.95 mm in dimension of the capsule. Her final pathological stage stage is T2bN1. This was completed on 05/23/2016. ? ? ? Nivolumab total of 240 mg dose every 2 weeks for total of 4 doses. Started on 07/21/2016.  She completed 4 cycles of therapy for a total of 2 months out of planned 12.  Patient opted out continuing immunotherapy. ? ?She is status post lymph node biopsy completed on Feb 15, 2021. ? ?Ipilimumab 3 mg/kg with a nivolumab at 1 mg/kg started on Mar 04, 2021.  She is status post 4 cycles of therapy. ?  ?Current therapy:   Nivolumab 480 mg every 4 weeks started on May 27, 2021.  She returns for the subsequent cycle of treatment. ? ?Interim History: [name] returns today for a follow-up visit.  Since the last visit, she reports no major complaints.  She has tolerated the last cycle of therapy without any complaints.  She denies any nausea,  vomiting or abdominal pain.  She denies any skin rashes or lesions.  She denies any GI toxicities. ? ? ?Medications: Reviewed without changes. ?Current Outpatient Medications  ?Medication Sig Dispense Refill  ? Biotin 1000 MCG CHEW Chew by mouth.    ? calcium carbonate (OS-CAL) 600 MG TABS Take 600 mg by mouth 2 (two) times daily with a meal.    ? cholecalciferol (VITAMIN D) 1000 UNITS tablet Take 5,000 Units by mouth daily.    ? Cyanocobalamin (VITAMIN B-12 CR PO) Take 1 tablet by mouth daily.    ? ?No current facility-administered medications for this visit.  ? ? ? ?Allergies:  ?Allergies  ?Allergen Reactions  ? Codeine Nausea Only and Other (See Comments)  ?  Hallucinations; confusion  ? ?Physical exam ? ? ? ? ? ? ? ?Blood pressure 98/74, pulse 70, temperature 98.2 ?F (36.8 ?C), weight 152 lb (68.9 kg), last menstrual period 06/30/1990, SpO2 99 %. ? ? ? ? ?ECOG 0 ? ? ?General appearance: Comfortable appearing without any discomfort ?Head: Normocephalic without any trauma ?Oropharynx: Mucous membranes are moist and pink without any thrush or ulcers. ?Eyes: Pupils are equal and round reactive to light. ?Lymph nodes: No cervical, supraclavicular, inguinal or axillary lymphadenopathy.   ?Heart:regular rate and rhythm.  S1 and S2 without leg edema. ?Lung: Clear without any rhonchi or wheezes.  No dullness to percussion. ?Abdomin: Soft, nontender, nondistended with good bowel sounds.  No hepatosplenomegaly. ?Musculoskeletal: No joint deformity or effusion.  Full range of motion noted. ?Neurological: No deficits noted on motor, sensory and deep tendon reflex exam. ?Skin: No rashes or lesions. ? ? ? ? ? ? ? ? ? ? ? ? ? ? ? ?Lab Results: ?Lab Results  ?Component Value Date  ? WBC 4.4 12/13/2021  ? HGB 14.0 12/13/2021  ? HCT 43.2 12/13/2021  ? MCV 85.9 12/13/2021  ? PLT 228 12/13/2021  ? ?  Chemistry   ?   ?Component Value Date/Time  ? NA 140 12/13/2021 1126  ? NA 140 09/08/2016 0755  ? K 4.0 12/13/2021 1126  ? K 3.9  09/08/2016 0755  ? CL 104 12/13/2021 1126  ? CO2 31 12/13/2021 1126  ? CO2 23 09/08/2016 0755  ? BUN 12 12/13/2021 1126  ? BUN 11.2 09/08/2016 0755  ? CREATININE 0.52 12/13/2021 1126  ? CREATININE 0.61 09/30/2019 0839  ? CREATININE 0.7 09/08/2016 0755  ?    ?Component Value Date/Time  ? CALCIUM 9.7 12/13/2021 1126  ? CALCIUM 9.1 09/08/2016 0755  ? ALKPHOS 70 12/13/2021 1126  ? ALKPHOS 61 09/08/2016 0755  ? AST 13 (L) 12/13/2021 1126  ? AST 13 09/08/2016 0755  ? ALT 16 12/13/2021 1126  ? ALT 16 09/08/2016 0755  ? BILITOT 0.3 12/13/2021 1126  ? BILITOT 0.42 09/08/2016 0755  ?  ? ? ? ? ?Impression and Plan: ? ? ?61 year old woman with: ? ?1.  Cutaneous melanoma of the lower extremity diagnosed in 2017.  She developed stage IV superficial spreading type with pelvic adenopathy. ? ?Her disease status was updated today and treatment choices were discussed.  Risks and benefits of continuing current regimen were reviewed.  Complications that include autoimmune complications, GI toxicity among others were discussed.  Alternative treatment options including therapy discontinuation among others were reviewed. ? ?2.  IV access: Peripheral veins currently in use without any issues.  Port-A-Cath option has been deferred. ? ?3.  Antiemetics: No nausea or vomiting reported at this time.  Compazine is available to her. ? ? ?4.  Autoimmune complications: No issues reported.  These include pneumonitis, colitis and thyroid disease were reiterated. ? ?5.  CNS staging: No evidence of CNS disease noted.  Last imaging studies was an MRI of the brain in 2022. ? ?6.  Hip pain/thigh pain: Appears to be musculoskeletal and manageable at this time with Tylenol. ? ?7.  Treatment goal and prognosis: Therapy remains palliative although aggressive measures are warranted. ? ?8.  Follow-up: In 4 weeks for repeat follow-up. ?  ? ? 30  minutes were dedicated to this encounter.  The time was spent on reviewing laboratory data, disease status update on  outlining future plan ? ? ?Zola Button, MD 01/10/2022 7:43 AM ?

## 2022-01-10 NOTE — Patient Instructions (Signed)
[facility]  Discharge Instructions: ?Thank you for choosing [facility] to provide your oncology and hematology care.  ? ?If you have a lab appointment with the Cancer Center, please go directly to the Cancer Center and check in at the registration area. ?  ?Wear comfortable clothing and clothing appropriate for easy access to any Portacath or PICC line.  ? ?We strive to give you quality time with your provider. You may need to reschedule your appointment if you arrive late (15 or more minutes).  Arriving late affects you and other patients whose appointments are after yours.  Also, if you miss three or more appointments without notifying the office, you may be dismissed from the clinic at the provider?s discretion.    ?  ?For prescription refill requests, have your pharmacy contact our office and allow 72 hours for refills to be completed.   ? ?Today you received the following chemotherapy and/or immunotherapy agents Opdivo    ?  ?To help prevent nausea and vomiting after your treatment, we encourage you to take your nausea medication as directed. ? ?BELOW ARE SYMPTOMS THAT SHOULD BE REPORTED IMMEDIATELY: ?*FEVER GREATER THAN 100.4 F (38 ?C) OR HIGHER ?*CHILLS OR SWEATING ?*NAUSEA AND VOMITING THAT IS NOT CONTROLLED WITH YOUR NAUSEA MEDICATION ?*UNUSUAL SHORTNESS OF BREATH ?*UNUSUAL BRUISING OR BLEEDING ?*URINARY PROBLEMS (pain or burning when urinating, or frequent urination) ?*BOWEL PROBLEMS (unusual diarrhea, constipation, pain near the anus) ?TENDERNESS IN MOUTH AND THROAT WITH OR WITHOUT PRESENCE OF ULCERS (sore throat, sores in mouth, or a toothache) ?UNUSUAL RASH, SWELLING OR PAIN  ?UNUSUAL VAGINAL DISCHARGE OR ITCHING  ? ?Items with * indicate a potential emergency and should be followed up as soon as possible or go to the Emergency Department if any problems should occur. ? ?Please show the CHEMOTHERAPY ALERT CARD or IMMUNOTHERAPY ALERT CARD at check-in to the  Emergency Department and triage nurse. ? ?Should you have questions after your visit or need to cancel or reschedule your appointment, please contact [facility]  Dept: [phone]  and follow the prompts.  Office hours are 8:00 a.m. to 4:30 p.m. Monday - Friday. Please note that voicemails left after 4:00 p.m. may not be returned until the following business day.  We are closed weekends and major holidays. You have access to a nurse at all times for urgent questions. Please call the main number to the clinic Dept: [phone] and follow the prompts. ? ? ?For any non-urgent questions, you may also contact your provider using MyChart. We now offer e-Visits for anyone 18 and older to request care online for non-urgent symptoms. For details visit [email or web address]. ?  ?Also download the MyChart app! Go to the app store, search "MyChart", open the app, select [facility], and log in with your MyChart username and password. ? ?Due to Covid, a mask is required upon entering the hospital/clinic. If you do not have a mask, one will be given to you upon arrival. For doctor visits, patients may have 1 support person aged 18 or older with them. For treatment visits, patients cannot have anyone with them due to current Covid guidelines and our immunocompromised population.  ? ?

## 2022-01-14 DIAGNOSIS — R7309 Other abnormal glucose: Secondary | ICD-10-CM | POA: Diagnosis not present

## 2022-02-02 DIAGNOSIS — L820 Inflamed seborrheic keratosis: Secondary | ICD-10-CM | POA: Diagnosis not present

## 2022-02-02 DIAGNOSIS — D2261 Melanocytic nevi of right upper limb, including shoulder: Secondary | ICD-10-CM | POA: Diagnosis not present

## 2022-02-02 DIAGNOSIS — L821 Other seborrheic keratosis: Secondary | ICD-10-CM | POA: Diagnosis not present

## 2022-02-02 DIAGNOSIS — D2262 Melanocytic nevi of left upper limb, including shoulder: Secondary | ICD-10-CM | POA: Diagnosis not present

## 2022-02-02 DIAGNOSIS — D485 Neoplasm of uncertain behavior of skin: Secondary | ICD-10-CM | POA: Diagnosis not present

## 2022-02-02 DIAGNOSIS — Z85820 Personal history of malignant melanoma of skin: Secondary | ICD-10-CM | POA: Diagnosis not present

## 2022-02-07 ENCOUNTER — Inpatient Hospital Stay: Payer: BC Managed Care – PPO | Attending: Oncology

## 2022-02-07 ENCOUNTER — Inpatient Hospital Stay (HOSPITAL_BASED_OUTPATIENT_CLINIC_OR_DEPARTMENT_OTHER): Payer: BC Managed Care – PPO | Admitting: Oncology

## 2022-02-07 ENCOUNTER — Other Ambulatory Visit: Payer: Self-pay

## 2022-02-07 ENCOUNTER — Inpatient Hospital Stay: Payer: BC Managed Care – PPO

## 2022-02-07 VITALS — BP 119/73 | HR 70 | Temp 97.9°F | Resp 17 | Wt 154.9 lb

## 2022-02-07 DIAGNOSIS — C4370 Malignant melanoma of unspecified lower limb, including hip: Secondary | ICD-10-CM | POA: Diagnosis not present

## 2022-02-07 DIAGNOSIS — Z5112 Encounter for antineoplastic immunotherapy: Secondary | ICD-10-CM | POA: Insufficient documentation

## 2022-02-07 DIAGNOSIS — C439 Malignant melanoma of skin, unspecified: Secondary | ICD-10-CM

## 2022-02-07 DIAGNOSIS — Z79899 Other long term (current) drug therapy: Secondary | ICD-10-CM | POA: Insufficient documentation

## 2022-02-07 LAB — CMP (CANCER CENTER ONLY)
ALT: 19 U/L (ref 0–44)
AST: 15 U/L (ref 15–41)
Albumin: 4.3 g/dL (ref 3.5–5.0)
Alkaline Phosphatase: 69 U/L (ref 38–126)
Anion gap: 5 (ref 5–15)
BUN: 13 mg/dL (ref 8–23)
CO2: 33 mmol/L — ABNORMAL HIGH (ref 22–32)
Calcium: 9.6 mg/dL (ref 8.9–10.3)
Chloride: 104 mmol/L (ref 98–111)
Creatinine: 0.53 mg/dL (ref 0.44–1.00)
GFR, Estimated: >60 mL/min (ref >60)
Glucose, Bld: 98 mg/dL (ref 70–99)
Potassium: 4.1 mmol/L (ref 3.5–5.1)
Sodium: 142 mmol/L (ref 135–145)
Total Bilirubin: 0.3 mg/dL (ref 0.3–1.2)
Total Protein: 7.1 g/dL (ref 6.5–8.1)

## 2022-02-07 LAB — CBC WITH DIFFERENTIAL (CANCER CENTER ONLY)
Abs Immature Granulocytes: 0 10*3/uL (ref 0.00–0.07)
Basophils Absolute: 0 10*3/uL (ref 0.0–0.1)
Basophils Relative: 1 %
Eosinophils Absolute: 0.1 10*3/uL (ref 0.0–0.5)
Eosinophils Relative: 3 %
HCT: 40 % (ref 36.0–46.0)
Hemoglobin: 13.4 g/dL (ref 12.0–15.0)
Immature Granulocytes: 0 %
Lymphocytes Relative: 40 %
Lymphs Abs: 1.8 10*3/uL (ref 0.7–4.0)
MCH: 28.6 pg (ref 26.0–34.0)
MCHC: 33.5 g/dL (ref 30.0–36.0)
MCV: 85.3 fL (ref 80.0–100.0)
Monocytes Absolute: 0.3 10*3/uL (ref 0.1–1.0)
Monocytes Relative: 7 %
Neutro Abs: 2.1 10*3/uL (ref 1.7–7.7)
Neutrophils Relative %: 49 %
Platelet Count: 210 10*3/uL (ref 150–400)
RBC: 4.69 MIL/uL (ref 3.87–5.11)
RDW: 13.4 % (ref 11.5–15.5)
WBC Count: 4.4 10*3/uL (ref 4.0–10.5)
nRBC: 0 % (ref 0.0–0.2)

## 2022-02-07 LAB — TSH: TSH: 1.379 u[IU]/mL (ref 0.350–4.500)

## 2022-02-07 MED ORDER — SODIUM CHLORIDE 0.9 % IV SOLN: Freq: Once | INTRAVENOUS | Status: DC

## 2022-02-07 MED ORDER — SODIUM CHLORIDE 0.9 % IV SOLN
480.0000 mg | Freq: Once | INTRAVENOUS | Status: AC
  Administered 2022-02-07: 480 mg via INTRAVENOUS
  Filled 2022-02-07: qty 48

## 2022-02-07 NOTE — Progress Notes (Signed)
Hematology and Oncology Follow Up ? ?Sharon Kirk ?[id number] ?[birth year] 61 y.o. ?02/07/2022 11:52 AM ?[name], MD  ? ? ? ? ? ?Principle Diagnosis: 61 year old woman with T2BN1 superficial spreading melanoma of the lower extremity diagnosed in 2017.  She developed stage IV disease with abdominal and pelvic adenopathy in May 2022.  History of stage ? ? ?Secondary diagnoses:  ? ? ?1. Superficial spreading melanoma of the lower extremity diagnosed in August 2017. She was found to have 1.25 mm thickness without ulceration or lymphovascular invasion. She had 1 out of 2 lymph nodes positive. The size of the lymph node measuring 0.25 x 0.95 mm with pathological staging T2bN1.  ?  ?2. Superficial spreading melanoma of the right shoulder diagnosed in August 2017.She is status post wide local excision and found tohave a 0.72 mm. These findings indicate a pathological staging of T1 NA. ?  ?  ?Prior Therapy: ?She is status post local excision followed by sentinel lymph node sampling with 2 lymph nodes sampled one was involved with metastatic melanoma. The measurement was 0.25 x 0.95 mm in dimension of the capsule. Her final pathological stage stage is T2bN1. This was completed on 05/23/2016. ? ? ? Nivolumab total of 240 mg dose every 2 weeks for total of 4 doses. Started on 07/21/2016.  She completed 4 cycles of therapy for a total of 2 months out of planned 12.  Patient opted out continuing immunotherapy. ? ?She is status post lymph node biopsy completed on Feb 15, 2021. ? ?Ipilimumab 3 mg/kg with a nivolumab at 1 mg/kg started on Mar 04, 2021.  She is status post 4 cycles of therapy. ?  ?Current therapy:   Nivolumab 480 mg every 4 weeks started on May 27, 2021.  She she is here for the next cycle of therapy. ? ?Interim History: [name] presents today for repeat evaluation.  Since last visit, she reports feeling well without any major complaints.  She has reported an area of lost hair on the top of  her scalp but no other complaints.  She denies any skin rashes or lesions.  He does not have any pruritus.  Her performance status and quality of life remains unchanged. ? ? ?Medications: Reviewed without changes. ?Current Outpatient Medications  ?Medication Sig Dispense Refill  ? Biotin 1000 MCG CHEW Chew by mouth.    ? calcium carbonate (OS-CAL) 600 MG TABS Take 600 mg by mouth 2 (two) times daily with a meal.    ? cholecalciferol (VITAMIN D) 1000 UNITS tablet Take 5,000 Units by mouth daily.    ? Cyanocobalamin (VITAMIN B-12 CR PO) Take 1 tablet by mouth daily.    ? ?No current facility-administered medications for this visit.  ? ? ? ?Allergies:  ?Allergies  ?Allergen Reactions  ? Codeine Nausea Only and Other (See Comments)  ?  Hallucinations; confusion  ? ?Physical exam ? ?Blood pressure 119/73, pulse 70, temperature 97.9 ?F (36.6 ?C), temperature source Temporal, resp. rate 17, weight 154 lb 14.4 oz (70.3 kg), last menstrual period 06/30/1990, SpO2 100 %. ? ? ?ECOG 0 ? ? ?General appearance: Alert, awake without any distress. ?Head: Atraumatic without abnormalities ?Oropharynx: Without any thrush or ulcers. ?Eyes: No scleral icterus. ?Lymph nodes: No lymphadenopathy noted in the cervical, supraclavicular, or axillary nodes ?Heart:regular rate and rhythm, without any murmurs or gallops.   ?Lung: Clear to auscultation without any rhonchi, wheezes or dullness to percussion. ?Abdomin: Soft, nontender without any shifting dullness or ascites. ?Musculoskeletal: No  clubbing or cyanosis. ?Neurological: No motor or sensory deficits. ?Skin: No rashes or lesions. ? ? ? ? ? ? ? ? ? ? ? ? ? ? ? ?Lab Results: ?Lab Results  ?Component Value Date  ? WBC 3.9 (L) 01/10/2022  ? HGB 13.8 01/10/2022  ? HCT 40.9 01/10/2022  ? MCV 84.2 01/10/2022  ? PLT 219 01/10/2022  ? ?  Chemistry   ?   ?Component Value Date/Time  ? NA 139 01/10/2022 0748  ? NA 140 09/08/2016 0755  ? K 4.5 01/10/2022 0748  ? K 3.9 09/08/2016 0755  ? CL 105  01/10/2022 0748  ? CO2 30 01/10/2022 0748  ? CO2 23 09/08/2016 0755  ? BUN 15 01/10/2022 0748  ? BUN 11.2 09/08/2016 0755  ? CREATININE 0.54 01/10/2022 0748  ? CREATININE 0.61 09/30/2019 0839  ? CREATININE 0.7 09/08/2016 0755  ?    ?Component Value Date/Time  ? CALCIUM 9.5 01/10/2022 0748  ? CALCIUM 9.1 09/08/2016 0755  ? ALKPHOS 69 01/10/2022 0748  ? ALKPHOS 61 09/08/2016 0755  ? AST 13 (L) 01/10/2022 0748  ? AST 13 09/08/2016 0755  ? ALT 15 01/10/2022 0748  ? ALT 16 09/08/2016 0755  ? BILITOT 0.4 01/10/2022 0748  ? BILITOT 0.42 09/08/2016 0755  ?  ? ? ? ? ?Impression and Plan: ? ? ?62 year old woman with: ? ?1.  T2bN1 superficial spreading melanoma of the lower extremity diagnosed in 2017.  She developed stage IV disease manifesting with pelvic adenopathy. ? ?She continues to tolerate nivolumab without any major complications.  Risks and benefits of continuing this treatment were reviewed.  She continues to tolerate therapy reasonably well with excellent response.  I recommended continuing treatment for the time being.  Duration of therapy will be determined by her continued response as well as tolerance.  Tentatively a total of 2 years of therapy would be considered.  I recommend updating her staging scans before the next visit.  She is agreeable to proceed. ? ?2.  IV access: No issues reported with peripheral veins at this time. ? ?3.  Antiemetics: Compazine is available to her without any nausea or vomiting. ? ? ?4.  Autoimmune complications: I continue to reiterate complications including pneumonitis, colitis and thyroid disease. ? ?5.  CNS staging: No signs or symptoms of CNS metastasis.  Her initial staging in 2022 showed no concerning evidence of metastasis. ? ?6.  Hip pain/thigh pain: Manageable at this time with limited changes.  Imaging studies including MRI did not show any evidence of malignancy. ? ?7.  Treatment goal and prognosis: Her disease is incurable although aggressive measures are  warranted. ? ?8.  Follow-up: She will return in 1 month for the next cycle of therapy. ?  ? ? 30  minutes were spent on this visit.  The time was dedicated to reviewing laboratory data, disease status update and outlining complications related to cancer and cancer therapy. ? ? ?Zola Button, MD 02/07/2022 11:52 AM ?

## 2022-02-13 ENCOUNTER — Encounter: Payer: Self-pay | Admitting: Oncology

## 2022-02-13 DIAGNOSIS — R7309 Other abnormal glucose: Secondary | ICD-10-CM | POA: Diagnosis not present

## 2022-02-16 ENCOUNTER — Other Ambulatory Visit: Payer: Self-pay | Admitting: Nurse Practitioner

## 2022-02-16 DIAGNOSIS — Z1231 Encounter for screening mammogram for malignant neoplasm of breast: Secondary | ICD-10-CM

## 2022-02-28 ENCOUNTER — Ambulatory Visit (HOSPITAL_COMMUNITY)
Admission: RE | Admit: 2022-02-28 | Discharge: 2022-02-28 | Disposition: A | Discharge status: Home / Self Care | Payer: BC Managed Care – PPO | Source: Ambulatory Visit | Attending: Oncology | Admitting: Oncology

## 2022-02-28 DIAGNOSIS — C649 Malignant neoplasm of unspecified kidney, except renal pelvis: Secondary | ICD-10-CM | POA: Diagnosis not present

## 2022-02-28 DIAGNOSIS — C439 Malignant melanoma of skin, unspecified: Secondary | ICD-10-CM | POA: Diagnosis not present

## 2022-02-28 DIAGNOSIS — I700 Atherosclerosis of aorta: Secondary | ICD-10-CM | POA: Diagnosis not present

## 2022-02-28 DIAGNOSIS — D7389 Other diseases of spleen: Secondary | ICD-10-CM | POA: Diagnosis not present

## 2022-02-28 DIAGNOSIS — J479 Bronchiectasis, uncomplicated: Secondary | ICD-10-CM | POA: Diagnosis not present

## 2022-02-28 DIAGNOSIS — K769 Liver disease, unspecified: Secondary | ICD-10-CM | POA: Diagnosis not present

## 2022-02-28 IMAGING — CT CT CHEST-ABD-PELV W/ CM
2 of 5 series · 12 of 36 positions shown, 14 images · IV contrast (APPLIED)
Comparison: Multiple priors including most recent CT [DATE]

CLINICAL DATA: Kidney cancer recurrence. Melanoma of skin. *
Tracking Code: BO * .

EXAM:
CT CHEST, ABDOMEN, AND PELVIS WITH CONTRAST
TECHNIQUE: Multidetector CT imaging of the chest, abdomen and pelvis was
performed following the standard protocol during bolus
administration of intravenous contrast.

[Series 2: cap with · axial · 0.90mm/px · z∈[-629,-79]mm · 9 of 138 slices shown, 11 images]
[im 14/138  mediastinal]
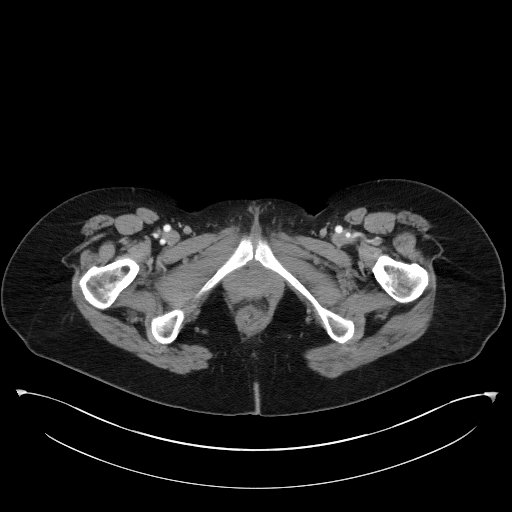
[im 14/138  bone]
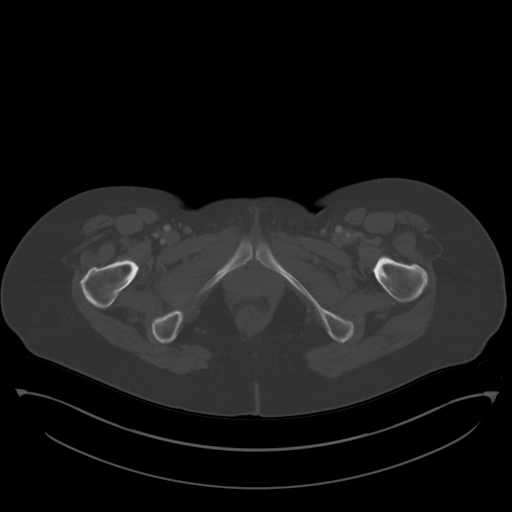
[im 28/138  mediastinal]
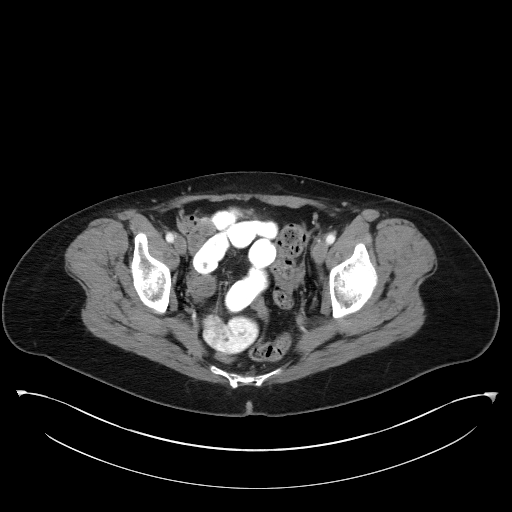
[im 42/138  mediastinal]
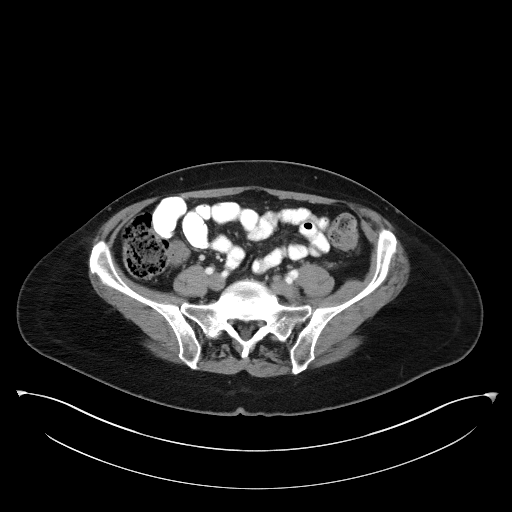
[im 55/138  mediastinal]
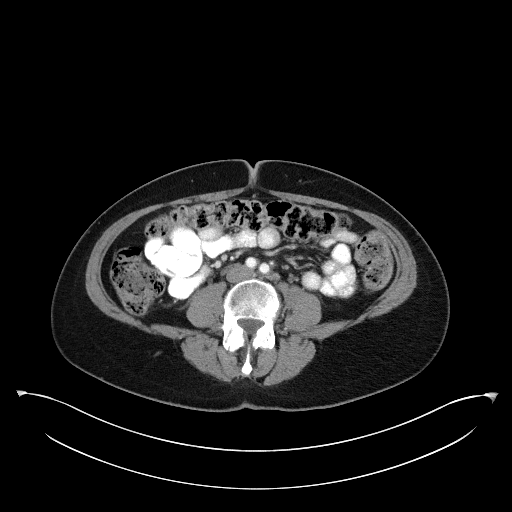
[im 69/138  mediastinal]
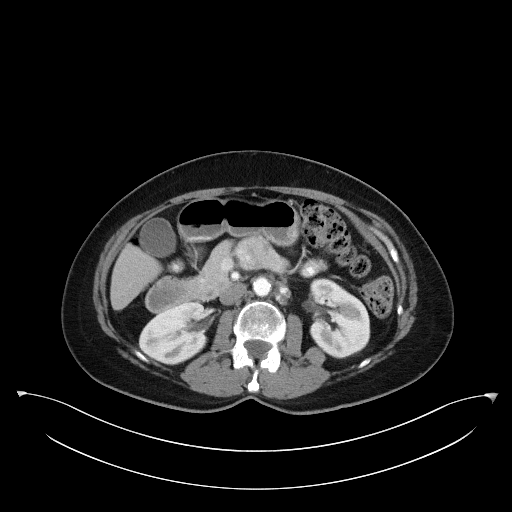
[im 83/138  mediastinal]
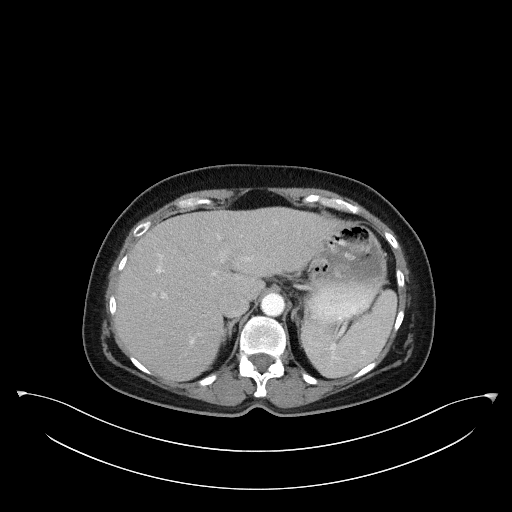
[im 96/138  mediastinal]
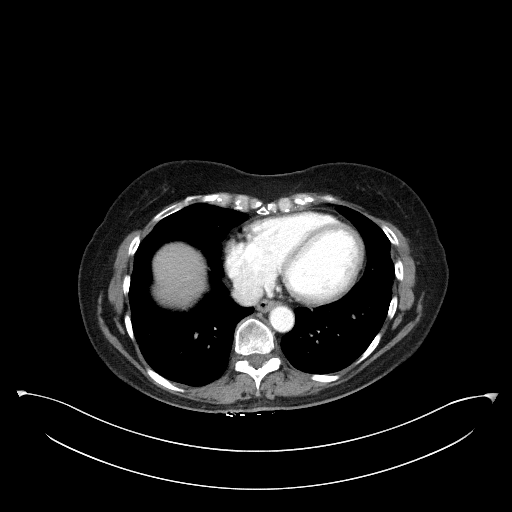
[im 110/138  mediastinal]
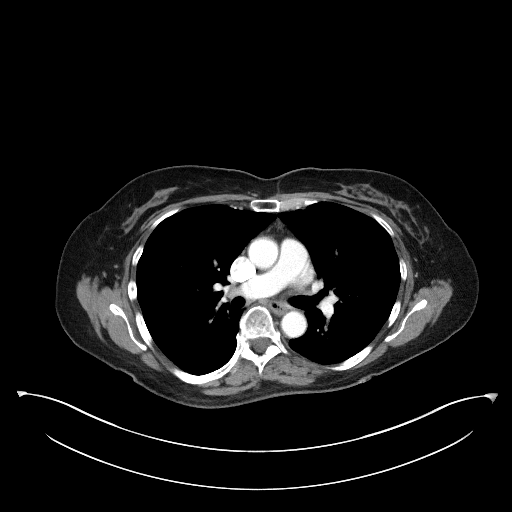
[im 124/138  mediastinal]
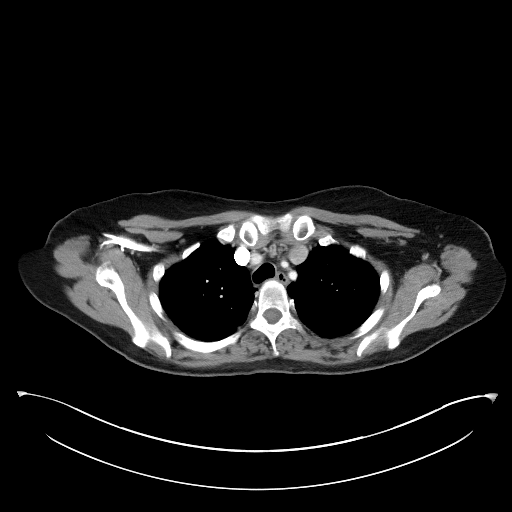
[im 124/138  bone]
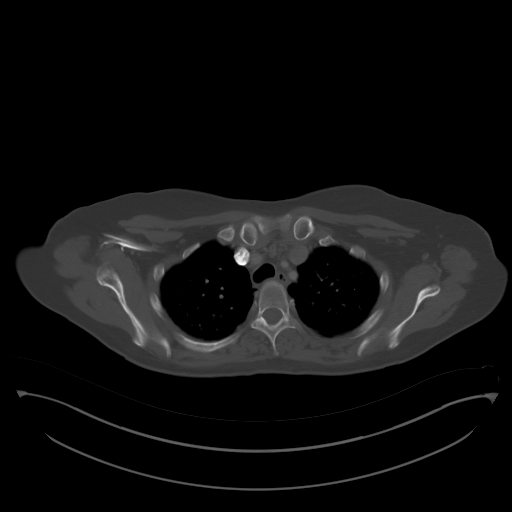

[Series 4: coronals · coronal · 0.82mm/px · 3 of 119 slices shown]
[im 24/119  mediastinal]
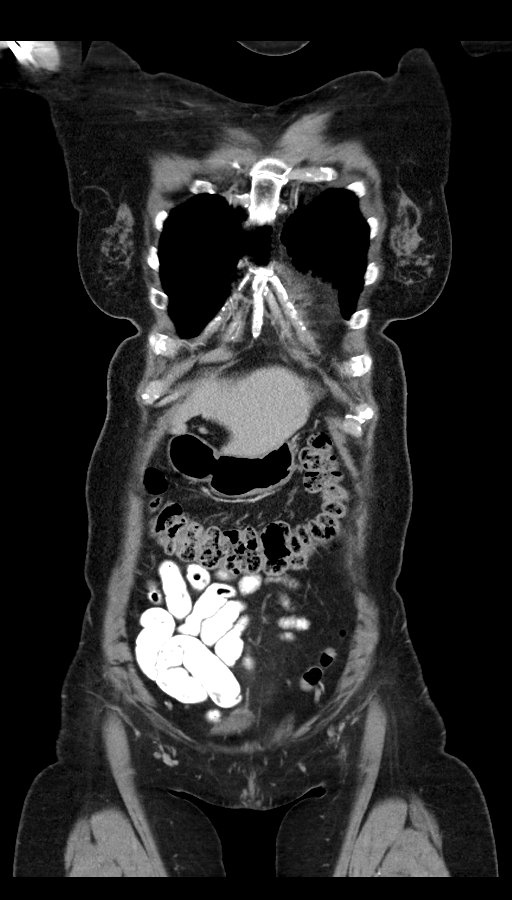
[im 48/119  mediastinal]
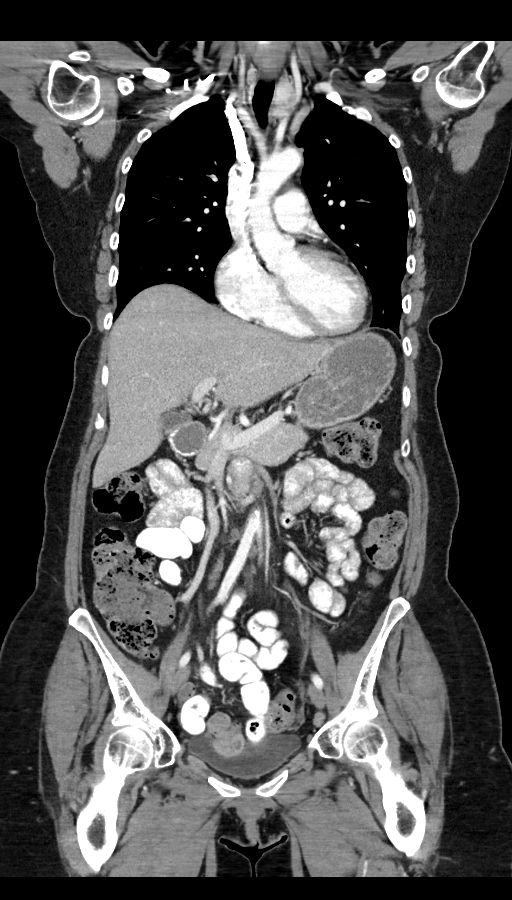
[im 71/119  mediastinal]
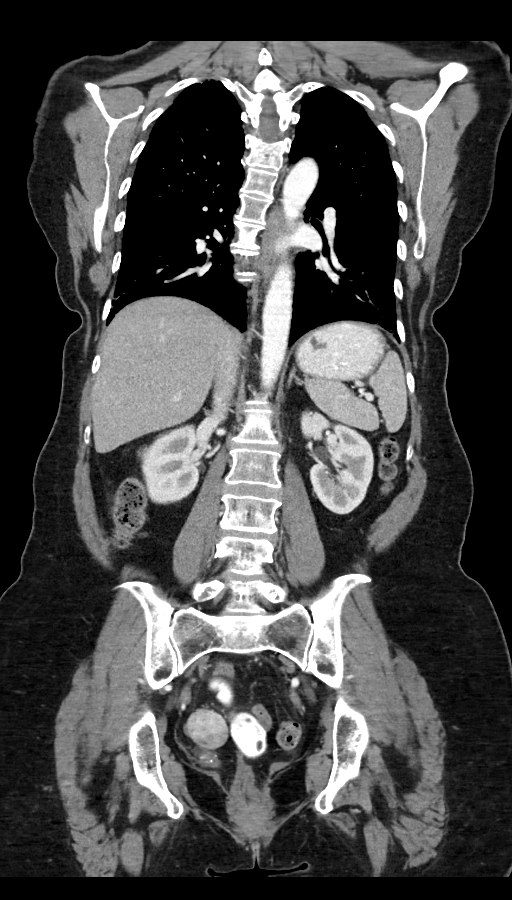

[12 of 36 positions shown; findings below may reference images not displayed]

RADIATION DOSE REDUCTION: This exam was performed according to the
departmental dose-optimization program which includes automated
exposure control, adjustment of the mA and/or kV according to
patient size and/or use of iterative reconstruction technique.

CONTRAST:  100mL OMNIPAQUE IOHEXOL 300 MG/ML  SOLN
FINDINGS: CT CHEST FINDINGS

Cardiovascular: Normal caliber thoracic aorta. No central pulmonary
embolus on this nondedicated study. Normal size heart. No
significant pericardial effusion/thickening.

Mediastinum/Nodes: Similar appearance of the heterogeneous
low-attenuation thyroid nodules measuring up to 2.3 cm in the left.
Previously evaluated with thyroid ultrasound [DATE]. No
follow-up recommended unless clinically warranted. (Ref: [HOSPITAL]. [DATE]): 143-50). No pathologically enlarged
mediastinal hilar or axillary lymph nodes. Previously indexed low
paratracheal/precarinal lymph node measures 7 mm in short axis
previously 11 mm. The esophagus is grossly unremarkable.

Lungs/Pleura: Biapical pleuroparenchymal scarring similar prior.
Coarsened areas of pulmonary parenchymal gland gas ground-glass with
a basilar predominance and associated mild bronchiectasis similar
over prior studies.

Index pulmonary nodules are as follows:

-superior segment right lower lobe pulmonary nodule measures 2 mm on
image 71/6, unchanged.

-peripheral right upper lobe pulmonary nodule measures 3 mm on image
42/6, unchanged.

No new suspicious pulmonary nodules or masses.

No pleural effusion.  No pneumothorax.

Musculoskeletal: No aggressive lytic or blastic lesion of bone.

CT ABDOMEN PELVIS FINDINGS

Hepatobiliary: Subcentimeter hypodense lesion in the left hepatic
dome on image 48/2 is technically too small to accurately
characterize but stable over multiple prior studies. No solid
enhancing hepatic lesion. Gallbladder is unremarkable. No biliary
ductal dilation.

Pancreas: No pancreatic ductal dilation or evidence of acute
inflammation.

Spleen: Decreased conspicuity of the hypodense splenic lesion now
measuring 12 mm on image 62/2 with similar size of an additional
hypodense 15 mm splenic lesion on image 53/2 difficult to further
characterize.

Adrenals/Urinary Tract: Bilateral adrenal glands appear normal. No
hydronephrosis. Kidneys demonstrate symmetric enhancement and
excretion of contrast material. Urinary bladder is grossly
unremarkable for degree of distension.

Stomach/Bowel: Radiopaque enteric contrast material traverses distal
loops of small bowel. The stomach is unremarkable for degree of
distension. There is no pathologic dilation of small or large bowel.
The terminal ileum appears normal. Normal appendix. Moderate volume
of formed stool throughout the colon. No evidence of acute bowel
inflammation.

Vascular/Lymphatic: Aortic atherosclerosis without abdominal aortic
aneurysm.

Heterogeneously hyperattenuating retroperitoneal adenopathy in the
abdomen and pelvis is similar prior. Previously index lymph nodes
are as follows:

-left periaortic lymph node now measures 17 x 16 mm on image 76/2
previously 24 x 19 mm.

-left external iliac lymph node now measures 11 x 10 mm on image
109/2 previously 14 x 12 mm.

No new or enlarging abdominal or pelvic adenopathy.

Reproductive: Uterus is surgically absent. No suspicious adnexal
mass.

Other: No significant abdominopelvic free fluid. No discrete
peritoneal or omental nodularity.

Musculoskeletal: No aggressive lytic or blastic lesion of bone.
IMPRESSION: 1. Continued regression in the abdominopelvic retroperitoneal
adenopathy.
2. Stable small pulmonary nodules.
3. No evidence of new or progressive disease in the chest, abdomen
or pelvis.
4. Hypodense splenic lesions again seen 1 of which appears
decreasing in conspicuity to prior imaging which may be related to
contrast timing but are difficult to further characterize. Continued
attention on follow-up imaging is suggested.
5. Peripheral and basilar predominant coarsened ground-glass and
mild bronchiectasis are similar to prior and possibly reflecting
immunotherapy related interstitial lung disease. If clinically
indicated consider further evaluation with ILD protocol chest CT
with inspiratory and expiratory imaging.
6.  Aortic Atherosclerosis ([VS]-[VS]).

## 2022-02-28 MED ORDER — IOHEXOL 300 MG/ML  SOLN
100.0000 mL | Freq: Once | INTRAMUSCULAR | Status: AC | PRN
  Administered 2022-02-28: 100 mL via INTRAVENOUS

## 2022-03-02 ENCOUNTER — Encounter: Payer: Self-pay | Admitting: Oncology

## 2022-03-08 ENCOUNTER — Ambulatory Visit: Payer: BC Managed Care – PPO | Admitting: Oncology

## 2022-03-08 ENCOUNTER — Other Ambulatory Visit: Payer: BC Managed Care – PPO

## 2022-03-08 ENCOUNTER — Inpatient Hospital Stay: Payer: BC Managed Care – PPO | Attending: Oncology

## 2022-03-08 ENCOUNTER — Ambulatory Visit: Payer: BC Managed Care – PPO

## 2022-03-08 ENCOUNTER — Inpatient Hospital Stay: Payer: BC Managed Care – PPO

## 2022-03-08 ENCOUNTER — Inpatient Hospital Stay (HOSPITAL_BASED_OUTPATIENT_CLINIC_OR_DEPARTMENT_OTHER): Payer: BC Managed Care – PPO | Admitting: Oncology

## 2022-03-08 ENCOUNTER — Other Ambulatory Visit: Payer: Self-pay

## 2022-03-08 VITALS — BP 104/68 | HR 62 | Temp 98.0°F | Resp 15 | Wt 155.5 lb

## 2022-03-08 DIAGNOSIS — Z79899 Other long term (current) drug therapy: Secondary | ICD-10-CM | POA: Insufficient documentation

## 2022-03-08 DIAGNOSIS — C439 Malignant melanoma of skin, unspecified: Secondary | ICD-10-CM

## 2022-03-08 DIAGNOSIS — Z7189 Other specified counseling: Secondary | ICD-10-CM | POA: Diagnosis not present

## 2022-03-08 DIAGNOSIS — Z5112 Encounter for antineoplastic immunotherapy: Secondary | ICD-10-CM | POA: Insufficient documentation

## 2022-03-08 DIAGNOSIS — C778 Secondary and unspecified malignant neoplasm of lymph nodes of multiple regions: Secondary | ICD-10-CM | POA: Insufficient documentation

## 2022-03-08 DIAGNOSIS — C4370 Malignant melanoma of unspecified lower limb, including hip: Secondary | ICD-10-CM | POA: Insufficient documentation

## 2022-03-08 LAB — CBC WITH DIFFERENTIAL (CANCER CENTER ONLY)
Abs Immature Granulocytes: 0.01 10*3/uL (ref 0.00–0.07)
Basophils Absolute: 0 10*3/uL (ref 0.0–0.1)
Basophils Relative: 1 %
Eosinophils Absolute: 0.1 10*3/uL (ref 0.0–0.5)
Eosinophils Relative: 3 %
HCT: 40.7 % (ref 36.0–46.0)
Hemoglobin: 13.7 g/dL (ref 12.0–15.0)
Immature Granulocytes: 0 %
Lymphocytes Relative: 37 %
Lymphs Abs: 1.5 10*3/uL (ref 0.7–4.0)
MCH: 28.2 pg (ref 26.0–34.0)
MCHC: 33.7 g/dL (ref 30.0–36.0)
MCV: 83.7 fL (ref 80.0–100.0)
Monocytes Absolute: 0.3 10*3/uL (ref 0.1–1.0)
Monocytes Relative: 8 %
Neutro Abs: 2.1 10*3/uL (ref 1.7–7.7)
Neutrophils Relative %: 51 %
Platelet Count: 245 10*3/uL (ref 150–400)
RBC: 4.86 MIL/uL (ref 3.87–5.11)
RDW: 13.5 % (ref 11.5–15.5)
WBC Count: 4.1 10*3/uL (ref 4.0–10.5)
nRBC: 0 % (ref 0.0–0.2)

## 2022-03-08 LAB — CMP (CANCER CENTER ONLY)
ALT: 19 U/L (ref 0–44)
AST: 15 U/L (ref 15–41)
Albumin: 4.3 g/dL (ref 3.5–5.0)
Alkaline Phosphatase: 69 U/L (ref 38–126)
Anion gap: 6 (ref 5–15)
BUN: 13 mg/dL (ref 8–23)
CO2: 30 mmol/L (ref 22–32)
Calcium: 9.5 mg/dL (ref 8.9–10.3)
Chloride: 103 mmol/L (ref 98–111)
Creatinine: 0.64 mg/dL (ref 0.44–1.00)
GFR, Estimated: >60 mL/min (ref >60)
Glucose, Bld: 85 mg/dL (ref 70–99)
Potassium: 4.2 mmol/L (ref 3.5–5.1)
Sodium: 139 mmol/L (ref 135–145)
Total Bilirubin: 0.3 mg/dL (ref 0.3–1.2)
Total Protein: 7.2 g/dL (ref 6.5–8.1)

## 2022-03-08 LAB — TSH: TSH: 1.724 u[IU]/mL (ref 0.350–4.500)

## 2022-03-08 MED ORDER — SODIUM CHLORIDE 0.9 % IV SOLN
480.0000 mg | Freq: Once | INTRAVENOUS | Status: AC
  Administered 2022-03-08: 480 mg via INTRAVENOUS
  Filled 2022-03-08: qty 48

## 2022-03-08 MED ORDER — SODIUM CHLORIDE 0.9 % IV SOLN: Freq: Once | INTRAVENOUS | Status: AC

## 2022-03-08 NOTE — Progress Notes (Signed)
Hematology and Oncology Follow Up  Sharon Kirk [id number] 1961-01-07 61 y.o. 03/08/2022 12:06 PM Sharon Face, MD       Principle Diagnosis: 61 year old woman with stage IV melanoma documented with abdominal adenopathy in May 2022.  She initially was diagnosed with T2BN1 superficial spreading melanoma of the lower extremity diagnosed in 2017.     Secondary diagnoses:    1. Superficial spreading melanoma of the lower extremity diagnosed in August 2017. She was found to have 1.25 mm thickness without ulceration or lymphovascular invasion. She had 1 out of 2 lymph nodes positive. The size of the lymph node measuring 0.25 x 0.95 mm with pathological staging T2bN1.    2. Superficial spreading melanoma of the right shoulder diagnosed in August 2017.She is status post wide local excision and found tohave a 0.72 mm. These findings indicate a pathological staging of T1 NA.     Prior Therapy: She is status post local excision followed by sentinel lymph node sampling with 2 lymph nodes sampled one was involved with metastatic melanoma. The measurement was 0.25 x 0.95 mm in dimension of the capsule. Her final pathological stage stage is T2bN1. This was completed on 05/23/2016.    Nivolumab total of 240 mg dose every 2 weeks for total of 4 doses. Started on 07/21/2016.  She completed 4 cycles of therapy for a total of 2 months out of planned 12.  Patient opted out continuing immunotherapy.  She is status post lymph node biopsy completed on Feb 15, 2021.  Ipilimumab 3 mg/kg with a nivolumab at 1 mg/kg started on Mar 04, 2021.  She is status post 4 cycles of therapy.   Current therapy:   Nivolumab 480 mg every 4 weeks started on May 27, 2021.  She returns here for the subsequent treatment.  Interim History: [name] is here for a follow-up visit.  Since the last visit, she reports no major changes in her health.  She has reported some hair loss but no other changes at this  time.  She denies any nausea, vomiting or abdominal pain.  She denies any cough, wheezing or hemoptysis.  Her performance status and quality of life remains excellent.  She denies any complications related to nivolumab.  She denies any changes in her bowel habits or respiratory status.   Medications: Updated on review. Current Outpatient Medications  Medication Sig Dispense Refill   Biotin 1000 MCG CHEW Chew by mouth.     calcium carbonate (OS-CAL) 600 MG TABS Take 600 mg by mouth 2 (two) times daily with a meal.     cholecalciferol (VITAMIN D) 1000 UNITS tablet Take 5,000 Units by mouth daily.     Cyanocobalamin (VITAMIN B-12 CR PO) Take 1 tablet by mouth daily.     No current facility-administered medications for this visit.     Allergies:  Allergies  Allergen Reactions   Codeine Nausea Only and Other (See Comments)    Hallucinations; confusion   Physical exam   Blood pressure 104/68, pulse 62, temperature 98 F (36.7 C), temperature source Tympanic, resp. rate 15, weight 155 lb 8 oz (70.5 kg), last menstrual period 06/30/1990, SpO2 100 %.   ECOG 0    General appearance: Comfortable appearing without any discomfort Head: Normocephalic without any trauma Oropharynx: Mucous membranes are moist and pink without any thrush or ulcers. Eyes: Pupils are equal and round reactive to light. Lymph nodes: No cervical, supraclavicular, inguinal or axillary lymphadenopathy.   Heart:regular rate and rhythm.  S1 and S2 without leg edema. Lung: Clear without any rhonchi or wheezes.  No dullness to percussion. Abdomin: Soft, nontender, nondistended with good bowel sounds.  No hepatosplenomegaly. Musculoskeletal: No joint deformity or effusion.  Full range of motion noted. Neurological: No deficits noted on motor, sensory and deep tendon reflex exam. Skin: No petechial rash or dryness.  Appeared moist.                  Lab Results: Lab Results  Component Value Date   WBC  4.4 02/07/2022   HGB 13.4 02/07/2022   HCT 40.0 02/07/2022   MCV 85.3 02/07/2022   PLT 210 02/07/2022     Chemistry      Component Value Date/Time   NA 142 02/07/2022 1203   NA 140 09/08/2016 0755   K 4.1 02/07/2022 1203   K 3.9 09/08/2016 0755   CL 104 02/07/2022 1203   CO2 33 (H) 02/07/2022 1203   CO2 23 09/08/2016 0755   BUN 13 02/07/2022 1203   BUN 11.2 09/08/2016 0755   CREATININE 0.53 02/07/2022 1203   CREATININE 0.61 09/30/2019 0839   CREATININE 0.7 09/08/2016 0755      Component Value Date/Time   CALCIUM 9.6 02/07/2022 1203   CALCIUM 9.1 09/08/2016 0755   ALKPHOS 69 02/07/2022 1203   ALKPHOS 61 09/08/2016 0755   AST 15 02/07/2022 1203   AST 13 09/08/2016 0755   ALT 19 02/07/2022 1203   ALT 16 09/08/2016 0755   BILITOT 0.3 02/07/2022 1203   BILITOT 0.42 09/08/2016 0755     IMPRESSION: 1. Continued regression in the abdominopelvic retroperitoneal adenopathy. 2. Stable small pulmonary nodules. 3. No evidence of new or progressive disease in the chest, abdomen or pelvis. 4. Hypodense splenic lesions again seen 1 of which appears decreasing in conspicuity to prior imaging which may be related to contrast timing but are difficult to further characterize. Continued attention on follow-up imaging is suggested. 5. Peripheral and basilar predominant coarsened ground-glass and mild bronchiectasis are similar to prior and possibly reflecting immunotherapy related interstitial lung disease. If clinically indicated consider further evaluation with ILD protocol chest CT with inspiratory and expiratory imaging. 6.  Aortic Atherosclerosis (ICD10-I70.0).     Impression and Plan:   61 year old woman with:  1.  Melanoma of the lower extremity diagnosed in 2017.  She was found to have T2bN1 superficial spreading and developed stage IV disease manifesting with pelvic and abdominal adenopathy.  CT scan obtained on Feb 28, 2022 was personally reviewed and continues to  show improvement in her disease and regression of her abdominal adenopathy.  Risks and benefits of continuing this treatment were reiterated.  Complications that include autoimmune complications, GI toxicity and dermatological issues were reviewed.  I recommended continuing this therapy at this time given her excellent tolerance and continued reasonable response.  She is agreeable to continue at this time.  2.  IV access: Peripheral veins will continue to be in use at this time.  3.  Antiemetics: No nausea or vomiting reported at this time.  Compazine is available to him.   4.  Autoimmune complications: I continue to educate about potential complications.  Including pneumonitis, colitis and thyroid disease.  There is faint evidence of follow-up interstitial lung disease on her imaging studies but no clinical signs or symptoms.  5.  CNS staging: We will repeat imaging studies if she develops any symptoms in the future.  6.  Hip pain/thigh pain: Appears to be musculoskeletal nature without  any evidence of disease in her imaging studies.  7.  Prognosis and goals of care: Therapy is palliative although aggressive measures are warranted given her excellent response.  8.  Follow-up: In 4 weeks for repeat follow-up.     30  minutes were dedicated to this encounter.  The time was spent on reviewing laboratory data, disease status update and outlining future plan of care discussion.   Zola Button, MD 03/08/2022 12:06 PM

## 2022-03-08 NOTE — Patient Instructions (Signed)
[facility] MEDICAL [facility]COL[facility]  Discharge Instructions: ?Thank you for choosing [facility] to provide your oncology and hematology care.  ? ?If you have a lab appointment with the Cancer Center, please go directly to the Cancer Center and check in at the registration area. ?  ?Wear comfortable clothing and clothing appropriate for easy access to any Portacath or PICC line.  ? ?We strive to give you quality time with your provider. You may need to reschedule your appointment if you arrive late (15 or more minutes).  Arriving late affects you and other patients whose appointments are after yours.  Also, if you miss three or more appointments without notifying the office, you may be dismissed from the clinic at the provider?s discretion.    ?  ?For prescription refill requests, have your pharmacy contact our office and allow 72 hours for refills to be completed.   ? ?Today you received the following chemotherapy and/or immunotherapy agents Opdivo    ?  ?To help prevent nausea and vomiting after your treatment, we encourage you to take your nausea medication as directed. ? ?BELOW ARE SYMPTOMS THAT SHOULD BE REPORTED IMMEDIATELY: ?*FEVER GREATER THAN 100.4 F (38 ?C) OR HIGHER ?*CHILLS OR SWEATING ?*NAUSEA AND VOMITING THAT IS NOT CONTROLLED WITH YOUR NAUSEA MEDICATION ?*UNUSUAL SHORTNESS OF BREATH ?*UNUSUAL BRUISING OR BLEEDING ?*URINARY PROBLEMS (pain or burning when urinating, or frequent urination) ?*BOWEL PROBLEMS (unusual diarrhea, constipation, pain near the anus) ?TENDERNESS IN MOUTH AND THROAT WITH OR WITHOUT PRESENCE OF ULCERS (sore throat, sores in mouth, or a toothache) ?UNUSUAL RASH, SWELLING OR PAIN  ?UNUSUAL VAGINAL DISCHARGE OR ITCHING  ? ?Items with * indicate a potential emergency and should be followed up as soon as possible or go to the Emergency Department if any problems should occur. ? ?Please show the CHEMOTHERAPY ALERT CARD or IMMUNOTHERAPY ALERT CARD at check-in to the  Emergency Department and triage nurse. ? ?Should you have questions after your visit or need to cancel or reschedule your appointment, please contact [facility]  Dept: [phone]  and follow the prompts.  Office hours are 8:00 a.m. to 4:30 p.m. Monday - Friday. Please note that voicemails left after 4:00 p.m. may not be returned until the following business day.  We are closed weekends and major holidays. You have access to a nurse at all times for urgent questions. Please call the main number to the clinic Dept: [phone] and follow the prompts. ? ? ?For any non-urgent questions, you may also contact your provider using MyChart. We now offer e-Visits for anyone 18 and older to request care online for non-urgent symptoms. For details visit [email or web address]. ?  ?Also download the MyChart app! Go to the app store, search "MyChart", open the app, select [facility], and log in with your MyChart username and password. ? ?Due to Covid, a mask is required upon entering the hospital/clinic. If you do not have a mask, one will be given to you upon arrival. For doctor visits, patients may have 1 support person aged 18 or older with them. For treatment visits, patients cannot have anyone with them due to current Covid guidelines and our immunocompromised population.  ? ?

## 2022-03-09 ENCOUNTER — Other Ambulatory Visit: Payer: Self-pay | Admitting: Oncology

## 2022-03-09 ENCOUNTER — Encounter: Payer: Self-pay | Admitting: Oncology

## 2022-03-09 DIAGNOSIS — Z13220 Encounter for screening for lipoid disorders: Secondary | ICD-10-CM

## 2022-03-16 DIAGNOSIS — R7309 Other abnormal glucose: Secondary | ICD-10-CM | POA: Diagnosis not present

## 2022-03-31 ENCOUNTER — Telehealth: Payer: Self-pay | Admitting: Oncology

## 2022-03-31 NOTE — Telephone Encounter (Signed)
Called patient regarding upcoming June and July appointment, patient has been called and notified.

## 2022-04-04 ENCOUNTER — Inpatient Hospital Stay: Payer: BC Managed Care – PPO

## 2022-04-04 ENCOUNTER — Inpatient Hospital Stay (HOSPITAL_BASED_OUTPATIENT_CLINIC_OR_DEPARTMENT_OTHER): Payer: BC Managed Care – PPO | Admitting: Oncology

## 2022-04-04 ENCOUNTER — Other Ambulatory Visit: Payer: Self-pay

## 2022-04-04 ENCOUNTER — Inpatient Hospital Stay: Payer: BC Managed Care – PPO | Attending: Oncology

## 2022-04-04 VITALS — BP 100/67 | HR 68 | Temp 98.2°F | Resp 17 | Ht 65.0 in | Wt 156.0 lb

## 2022-04-04 DIAGNOSIS — M1990 Unspecified osteoarthritis, unspecified site: Secondary | ICD-10-CM | POA: Diagnosis not present

## 2022-04-04 DIAGNOSIS — M79659 Pain in unspecified thigh: Secondary | ICD-10-CM | POA: Diagnosis not present

## 2022-04-04 DIAGNOSIS — Z5112 Encounter for antineoplastic immunotherapy: Secondary | ICD-10-CM | POA: Insufficient documentation

## 2022-04-04 DIAGNOSIS — C439 Malignant melanoma of skin, unspecified: Secondary | ICD-10-CM | POA: Diagnosis not present

## 2022-04-04 DIAGNOSIS — C4370 Malignant melanoma of unspecified lower limb, including hip: Secondary | ICD-10-CM | POA: Insufficient documentation

## 2022-04-04 DIAGNOSIS — Z13220 Encounter for screening for lipoid disorders: Secondary | ICD-10-CM

## 2022-04-04 DIAGNOSIS — M25559 Pain in unspecified hip: Secondary | ICD-10-CM | POA: Insufficient documentation

## 2022-04-04 LAB — CBC WITH DIFFERENTIAL (CANCER CENTER ONLY)
Abs Immature Granulocytes: 0.02 10*3/uL (ref 0.00–0.07)
Basophils Absolute: 0.1 10*3/uL (ref 0.0–0.1)
Basophils Relative: 1 %
Eosinophils Absolute: 0.1 10*3/uL (ref 0.0–0.5)
Eosinophils Relative: 2 %
HCT: 42.4 % (ref 36.0–46.0)
Hemoglobin: 14.1 g/dL (ref 12.0–15.0)
Immature Granulocytes: 0 %
Lymphocytes Relative: 34 %
Lymphs Abs: 1.7 10*3/uL (ref 0.7–4.0)
MCH: 28.3 pg (ref 26.0–34.0)
MCHC: 33.3 g/dL (ref 30.0–36.0)
MCV: 85.1 fL (ref 80.0–100.0)
Monocytes Absolute: 0.3 10*3/uL (ref 0.1–1.0)
Monocytes Relative: 5 %
Neutro Abs: 3 10*3/uL (ref 1.7–7.7)
Neutrophils Relative %: 58 %
Platelet Count: 208 10*3/uL (ref 150–400)
RBC: 4.98 MIL/uL (ref 3.87–5.11)
RDW: 13.6 % (ref 11.5–15.5)
WBC Count: 5.2 10*3/uL (ref 4.0–10.5)
nRBC: 0 % (ref 0.0–0.2)

## 2022-04-04 LAB — CMP (CANCER CENTER ONLY)
ALT: 17 U/L (ref 0–44)
AST: 14 U/L — ABNORMAL LOW (ref 15–41)
Albumin: 4.5 g/dL (ref 3.5–5.0)
Alkaline Phosphatase: 62 U/L (ref 38–126)
Anion gap: 6 (ref 5–15)
BUN: 13 mg/dL (ref 8–23)
CO2: 29 mmol/L (ref 22–32)
Calcium: 9.7 mg/dL (ref 8.9–10.3)
Chloride: 104 mmol/L (ref 98–111)
Creatinine: 0.57 mg/dL (ref 0.44–1.00)
GFR, Estimated: >60 mL/min (ref >60)
Glucose, Bld: 103 mg/dL — ABNORMAL HIGH (ref 70–99)
Potassium: 3.6 mmol/L (ref 3.5–5.1)
Sodium: 139 mmol/L (ref 135–145)
Total Bilirubin: 0.3 mg/dL (ref 0.3–1.2)
Total Protein: 7.3 g/dL (ref 6.5–8.1)

## 2022-04-04 LAB — LIPID PANEL
Cholesterol: 254 mg/dL — ABNORMAL HIGH (ref 0–200)
HDL: 57 mg/dL (ref >40)
LDL Cholesterol: 161 mg/dL — ABNORMAL HIGH (ref 0–99)
Total CHOL/HDL Ratio: 4.5 RATIO
Triglycerides: 180 mg/dL — ABNORMAL HIGH (ref <150)
VLDL: 36 mg/dL (ref 0–40)

## 2022-04-04 LAB — TSH: TSH: 0.673 u[IU]/mL (ref 0.350–4.500)

## 2022-04-04 MED ORDER — SODIUM CHLORIDE 0.9 % IV SOLN
480.0000 mg | Freq: Once | INTRAVENOUS | Status: AC
  Administered 2022-04-04: 480 mg via INTRAVENOUS
  Filled 2022-04-04: qty 48

## 2022-04-04 MED ORDER — SODIUM CHLORIDE 0.9 % IV SOLN: Freq: Once | INTRAVENOUS | Status: AC

## 2022-04-04 NOTE — Progress Notes (Signed)
Hematology and Oncology Follow Up  Sharon Kirk [id number] Feb 18, 1961 61 y.o. 04/04/2022 11:45 AM Sharon Kirk, Sharon Brow, MD       Principle Diagnosis: 61 year old woman with T2BN1 superficial spreading melanoma of the lower extremity diagnosed in 2017.  He developed stage IV disease with abdominal adenopathy in May 2022.    Secondary diagnoses:    1. Superficial spreading melanoma of the lower extremity diagnosed in August 2017. She was found to have 1.25 mm thickness without ulceration or lymphovascular invasion. She had 1 out of 2 lymph nodes positive. The size of the lymph node measuring 0.25 x 0.95 mm with pathological staging T2bN1.    2. Superficial spreading melanoma of the right shoulder diagnosed in August 2017.She is status post wide local excision and found tohave a 0.72 mm. These findings indicate a pathological staging of T1 NA.     Prior Therapy: She is status post local excision followed by sentinel lymph node sampling with 2 lymph nodes sampled one was involved with metastatic melanoma. The measurement was 0.25 x 0.95 mm in dimension of the capsule. Her final pathological stage stage is T2bN1. This was completed on 05/23/2016.    Nivolumab total of 240 mg dose every 2 weeks for total of 4 doses. Started on 07/21/2016.  She completed 4 cycles of therapy for a total of 2 months out of planned 12.  Patient opted out continuing immunotherapy.  She is status post lymph node biopsy completed on Feb 15, 2021.  Ipilimumab 3 mg/kg with a nivolumab at 1 mg/kg started on Mar 04, 2021.  She is status post 4 cycles of therapy.   Current therapy:   Nivolumab 480 mg every 4 weeks started on May 27, 2021.  She is here for the next cycle of treatment.  Interim History: Sharon Kirk returns today for a repeat evaluation.  Since last visit, she reports feeling well without any major complaints.  She denies any nausea, vomiting or abdominal pain.  She did have 1 area on her  scalp with hair loss but no other complaints.  She denies any diarrhea or change in her bowel habits.  She denies any respiratory complaints.  She denies any arthralgias or myalgias.  Medications: Reviewed without changes. Current Outpatient Medications  Medication Sig Dispense Refill   Biotin 1000 MCG CHEW Chew by mouth.     calcium carbonate (OS-CAL) 600 MG TABS Take 600 mg by mouth 2 (two) times daily with a meal.     cholecalciferol (VITAMIN D) 1000 UNITS tablet Take 5,000 Units by mouth daily.     Cyanocobalamin (VITAMIN B-12 CR PO) Take 1 tablet by mouth daily.     No current facility-administered medications for this visit.     Allergies:  Allergies  Allergen Reactions   Codeine Nausea Only and Other (See Comments)    Hallucinations; confusion   Physical exam   Blood pressure 100/67, pulse 68, temperature 98.2 F (36.8 C), temperature source Tympanic, resp. rate 17, height '5\' 5"'$  (1.651 m), weight 156 lb (70.8 kg), last menstrual period 06/30/1990, SpO2 99 %.   ECOG 0   General appearance: Alert, awake without any distress. Head: Atraumatic without abnormalities Oropharynx: Without any thrush or ulcers. Eyes: No scleral icterus. Lymph nodes: No lymphadenopathy noted in the cervical, supraclavicular, or axillary nodes Heart:regular rate and rhythm, without any murmurs or gallops.   Lung: Clear to auscultation without any rhonchi, wheezes or dullness to percussion. Abdomin: Soft, nontender without any shifting dullness or  ascites. Musculoskeletal: No clubbing or cyanosis. Neurological: No motor or sensory deficits. Skin: No rashes or lesions.                 Lab Results: Lab Results  Component Value Date   WBC 5.2 04/04/2022   HGB 14.1 04/04/2022   HCT 42.4 04/04/2022   MCV 85.1 04/04/2022   PLT 208 04/04/2022     Chemistry      Component Value Date/Time   NA 139 03/08/2022 1219   NA 140 09/08/2016 0755   K 4.2 03/08/2022 1219   K 3.9  09/08/2016 0755   CL 103 03/08/2022 1219   CO2 30 03/08/2022 1219   CO2 23 09/08/2016 0755   BUN 13 03/08/2022 1219   BUN 11.2 09/08/2016 0755   CREATININE 0.64 03/08/2022 1219   CREATININE 0.61 09/30/2019 0839   CREATININE 0.7 09/08/2016 0755      Component Value Date/Time   CALCIUM 9.5 03/08/2022 1219   CALCIUM 9.1 09/08/2016 0755   ALKPHOS 69 03/08/2022 1219   ALKPHOS 61 09/08/2016 0755   AST 15 03/08/2022 1219   AST 13 09/08/2016 0755   ALT 19 03/08/2022 1219   ALT 16 09/08/2016 0755   BILITOT 0.3 03/08/2022 1219   BILITOT 0.42 09/08/2016 0755       Impression and Plan:   61 year old woman with:  1.  Stage IV melanoma with abdominal adenopathy documented in May 2022.    She continues to tolerate nivolumab without any complaints.  Risks and benefits of continuing this treatment were reviewed at this time.  Plan is to continue this treatment until tentatively May 2024.  We will update her staging scan in October 2023.  2.  IV access: No issues reported with peripheral veins.  3.  Antiemetics: Compazine is available to her without any nausea or vomiting.   4.  Autoimmune complications: She has not experienced any pneumonitis, colitis and thyroid disease.  We will continue to educate about potential issues.  5.  CNS staging: No evidence of metastatic disease on previous imaging.  Is been updated in the future as needed.  6.  Hip pain/thigh pain: Related to arthritis without any exacerbation.  7.  Prognosis and goals of care: Treatment is palliative though aggressive measures are warranted given her reasonable performance status.  8.  Follow-up: She will return in 1 month for a follow-up visit.     30  minutes were spent on this visit.  The time was dedicated to reviewing laboratory data, disease status update and outlining future plan of care discussion.   Zola Button, MD 04/04/2022 11:45 AM

## 2022-04-15 DIAGNOSIS — R7309 Other abnormal glucose: Secondary | ICD-10-CM | POA: Diagnosis not present

## 2022-05-03 ENCOUNTER — Ambulatory Visit: Payer: BC Managed Care – PPO | Admitting: Oncology

## 2022-05-03 ENCOUNTER — Ambulatory Visit: Payer: BC Managed Care – PPO

## 2022-05-03 ENCOUNTER — Inpatient Hospital Stay (HOSPITAL_BASED_OUTPATIENT_CLINIC_OR_DEPARTMENT_OTHER): Payer: BC Managed Care – PPO | Admitting: Oncology

## 2022-05-03 ENCOUNTER — Inpatient Hospital Stay: Payer: BC Managed Care – PPO

## 2022-05-03 ENCOUNTER — Other Ambulatory Visit: Payer: Self-pay

## 2022-05-03 ENCOUNTER — Inpatient Hospital Stay: Payer: BC Managed Care – PPO | Attending: Oncology

## 2022-05-03 ENCOUNTER — Other Ambulatory Visit: Payer: BC Managed Care – PPO

## 2022-05-03 VITALS — BP 104/68 | HR 62 | Temp 97.8°F | Resp 17 | Ht 65.0 in | Wt 156.2 lb

## 2022-05-03 VITALS — BP 97/62 | HR 67 | Temp 97.8°F | Resp 18 | Ht 65.0 in

## 2022-05-03 DIAGNOSIS — C4370 Malignant melanoma of unspecified lower limb, including hip: Secondary | ICD-10-CM | POA: Insufficient documentation

## 2022-05-03 DIAGNOSIS — Z79899 Other long term (current) drug therapy: Secondary | ICD-10-CM | POA: Insufficient documentation

## 2022-05-03 DIAGNOSIS — C439 Malignant melanoma of skin, unspecified: Secondary | ICD-10-CM

## 2022-05-03 DIAGNOSIS — Z5112 Encounter for antineoplastic immunotherapy: Secondary | ICD-10-CM | POA: Insufficient documentation

## 2022-05-03 LAB — CMP (CANCER CENTER ONLY)
ALT: 18 U/L (ref 0–44)
AST: 17 U/L (ref 15–41)
Albumin: 4.5 g/dL (ref 3.5–5.0)
Alkaline Phosphatase: 65 U/L (ref 38–126)
Anion gap: 5 (ref 5–15)
BUN: 12 mg/dL (ref 8–23)
CO2: 30 mmol/L (ref 22–32)
Calcium: 9.7 mg/dL (ref 8.9–10.3)
Chloride: 105 mmol/L (ref 98–111)
Creatinine: 0.53 mg/dL (ref 0.44–1.00)
GFR, Estimated: >60 mL/min (ref >60)
Glucose, Bld: 89 mg/dL (ref 70–99)
Potassium: 3.7 mmol/L (ref 3.5–5.1)
Sodium: 140 mmol/L (ref 135–145)
Total Bilirubin: 0.4 mg/dL (ref 0.3–1.2)
Total Protein: 7.2 g/dL (ref 6.5–8.1)

## 2022-05-03 LAB — TSH: TSH: 0.987 u[IU]/mL (ref 0.350–4.500)

## 2022-05-03 LAB — CBC WITH DIFFERENTIAL (CANCER CENTER ONLY)
Abs Immature Granulocytes: 0.01 10*3/uL (ref 0.00–0.07)
Basophils Absolute: 0 10*3/uL (ref 0.0–0.1)
Basophils Relative: 1 %
Eosinophils Absolute: 0.1 10*3/uL (ref 0.0–0.5)
Eosinophils Relative: 3 %
HCT: 41.1 % (ref 36.0–46.0)
Hemoglobin: 13.8 g/dL (ref 12.0–15.0)
Immature Granulocytes: 0 %
Lymphocytes Relative: 37 %
Lymphs Abs: 1.6 10*3/uL (ref 0.7–4.0)
MCH: 28.3 pg (ref 26.0–34.0)
MCHC: 33.6 g/dL (ref 30.0–36.0)
MCV: 84.4 fL (ref 80.0–100.0)
Monocytes Absolute: 0.3 10*3/uL (ref 0.1–1.0)
Monocytes Relative: 7 %
Neutro Abs: 2.3 10*3/uL (ref 1.7–7.7)
Neutrophils Relative %: 52 %
Platelet Count: 219 10*3/uL (ref 150–400)
RBC: 4.87 MIL/uL (ref 3.87–5.11)
RDW: 13.4 % (ref 11.5–15.5)
WBC Count: 4.3 10*3/uL (ref 4.0–10.5)
nRBC: 0 % (ref 0.0–0.2)

## 2022-05-03 MED ORDER — SODIUM CHLORIDE 0.9 % IV SOLN
480.0000 mg | Freq: Once | INTRAVENOUS | Status: AC
  Administered 2022-05-03: 480 mg via INTRAVENOUS
  Filled 2022-05-03: qty 48

## 2022-05-03 MED ORDER — SODIUM CHLORIDE 0.9 % IV SOLN: Freq: Once | INTRAVENOUS | Status: AC

## 2022-05-03 NOTE — Patient Instructions (Signed)
[facility]COL[facility]  Discharge Instructions: Thank you for choosing [facility] to provide your oncology and hematology care.   If you have a lab appointment with the Cancer Center, please go directly to the Cancer Center and check in at the registration area.   Wear comfortable clothing and clothing appropriate for easy access to any Portacath or PICC line.   We strive to give you quality time with your provider. You may need to reschedule your appointment if you arrive late (15 or more minutes).  Arriving late affects you and other patients whose appointments are after yours.  Also, if you miss three or more appointments without notifying the office, you may be dismissed from the clinic at the provider's discretion.      For prescription refill requests, have your pharmacy contact our office and allow 72 hours for refills to be completed.    Today you received the following chemotherapy and/or immunotherapy agents: Opdivo      To help prevent nausea and vomiting after your treatment, we encourage you to take your nausea medication as directed.  BELOW ARE SYMPTOMS THAT SHOULD BE REPORTED IMMEDIATELY: *FEVER GREATER THAN 100.4 F (38 C) OR HIGHER *CHILLS OR SWEATING *NAUSEA AND VOMITING THAT IS NOT CONTROLLED WITH YOUR NAUSEA MEDICATION *UNUSUAL SHORTNESS OF BREATH *UNUSUAL BRUISING OR BLEEDING *URINARY PROBLEMS (pain or burning when urinating, or frequent urination) *BOWEL PROBLEMS (unusual diarrhea, constipation, pain near the anus) TENDERNESS IN MOUTH AND THROAT WITH OR WITHOUT PRESENCE OF ULCERS (sore throat, sores in mouth, or a toothache) UNUSUAL RASH, SWELLING OR PAIN  UNUSUAL VAGINAL DISCHARGE OR ITCHING   Items with * indicate a potential emergency and should be followed up as soon as possible or go to the Emergency Department if any problems should occur.  Please show the CHEMOTHERAPY ALERT CARD or IMMUNOTHERAPY ALERT CARD at check-in to the  Emergency Department and triage nurse.  Should you have questions after your visit or need to cancel or reschedule your appointment, please contact [facility]  Dept: [phone]  and follow the prompts.  Office hours are 8:00 a.m. to 4:30 p.m. Monday - Friday. Please note that voicemails left after 4:00 p.m. may not be returned until the following business day.  We are closed weekends and major holidays. You have access to a nurse at all times for urgent questions. Please call the main number to the clinic Dept: [phone] and follow the prompts.   For any non-urgent questions, you may also contact your provider using MyChart. We now offer e-Visits for anyone 18 and older to request care online for non-urgent symptoms. For details visit [email or web address].   Also download the MyChart app! Go to the app store, search "MyChart", open the app, select , and log in with your MyChart username and password.  Masks are optional in the cancer centers. If you would like for your care team to wear a mask while they are taking care of you, please let them know. For doctor visits, patients may have with them one support person who is at least 63 years old. At this time, visitors are not allowed in the infusion area. 

## 2022-05-03 NOTE — Progress Notes (Signed)
Hematology and Oncology Follow Up  TAM SAVOIA [id number] June 19, 1961 61 y.o. 05/03/2022 11:46 AM [name], MD       Principle Diagnosis: 61 year old woman with stage IV melanoma documented in May 2022 with abdominal adenopathy.  She initially presented with T2BN1 superficial spreading tumor in the lower extremity diagnosed in 2017.    Secondary diagnoses:    1. Superficial spreading melanoma of the lower extremity diagnosed in August 2017. She was found to have 1.25 mm thickness without ulceration or lymphovascular invasion. She had 1 out of 2 lymph nodes positive. The size of the lymph node measuring 0.25 x 0.95 mm with pathological staging T2bN1.    2. Superficial spreading melanoma of the right shoulder diagnosed in August 2017.She is status post wide local excision and found tohave a 0.72 mm. These findings indicate a pathological staging of T1 NA.     Prior Therapy: She is status post local excision followed by sentinel lymph node sampling with 2 lymph nodes sampled one was involved with metastatic melanoma. The measurement was 0.25 x 0.95 mm in dimension of the capsule. Her final pathological stage stage is T2bN1. This was completed on 05/23/2016.    Nivolumab total of 240 mg dose every 2 weeks for total of 4 doses. Started on 07/21/2016.  She completed 4 cycles of therapy for a total of 2 months out of planned 12.  Patient opted out continuing immunotherapy.  She is status post lymph node biopsy completed on Feb 15, 2021.  Ipilimumab 3 mg/kg with a nivolumab at 1 mg/kg started on Mar 04, 2021.  She is status post 4 cycles of therapy.   Current therapy:   Nivolumab 480 mg every 4 weeks started on May 27, 2021.  She returns for subsequent cycle of treatment.  Interim History: Mrs. Bocchino is here for follow-up.  Since the last visit, she reports no major changes in her health.  She continues to be active and attends to activities of daily living.  She  denies any nausea, vomiting or abdominal pain.  She denies any worsening hip pain.  She does have thinning and balding spot on scalp but no other hair or nail changes.  Medications: Updated on review. Current Outpatient Medications  Medication Sig Dispense Refill   Biotin 1000 MCG CHEW Chew by mouth.     calcium carbonate (OS-CAL) 600 MG TABS Take 600 mg by mouth 2 (two) times daily with a meal.     cholecalciferol (VITAMIN D) 1000 UNITS tablet Take 5,000 Units by mouth daily.     Cyanocobalamin (VITAMIN B-12 CR PO) Take 1 tablet by mouth daily.     No current facility-administered medications for this visit.     Allergies:  Allergies  Allergen Reactions   Codeine Nausea Only and Other (See Comments)    Hallucinations; confusion   Physical exam   Blood pressure 104/68, pulse 62, temperature 97.8 F (36.6 C), temperature source Temporal, resp. rate 17, height '5\' 5"'$  (1.651 m), weight 156 lb 3.2 oz (70.9 kg), last menstrual period 06/30/1990, SpO2 98 %.    ECOG 0    General appearance: Comfortable appearing without any discomfort Head: Normocephalic without any trauma Oropharynx: Mucous membranes are moist and pink without any thrush or ulcers. Eyes: Pupils are equal and round reactive to light. Lymph nodes: No cervical, supraclavicular, inguinal or axillary lymphadenopathy.   Heart:regular rate and rhythm.  S1 and S2 without leg edema. Lung: Clear without any rhonchi or wheezes.  No dullness to percussion. Abdomin: Soft, nontender, nondistended with good bowel sounds.  No hepatosplenomegaly. Musculoskeletal: No joint deformity or effusion.  Full range of motion noted. Neurological: No deficits noted on motor, sensory and deep tendon reflex exam. Skin: Thinning of the hair noted around a the top of her scalp.                 Lab Results: Lab Results  Component Value Date   WBC 4.3 05/03/2022   HGB 13.8 05/03/2022   HCT 41.1 05/03/2022   MCV 84.4  05/03/2022   PLT 219 05/03/2022     Chemistry      Component Value Date/Time   NA 139 04/04/2022 1118   NA 140 09/08/2016 0755   K 3.6 04/04/2022 1118   K 3.9 09/08/2016 0755   CL 104 04/04/2022 1118   CO2 29 04/04/2022 1118   CO2 23 09/08/2016 0755   BUN 13 04/04/2022 1118   BUN 11.2 09/08/2016 0755   CREATININE 0.57 04/04/2022 1118   CREATININE 0.61 09/30/2019 0839   CREATININE 0.7 09/08/2016 0755      Component Value Date/Time   CALCIUM 9.7 04/04/2022 1118   CALCIUM 9.1 09/08/2016 0755   ALKPHOS 62 04/04/2022 1118   ALKPHOS 61 09/08/2016 0755   AST 14 (L) 04/04/2022 1118   AST 13 09/08/2016 0755   ALT 17 04/04/2022 1118   ALT 16 09/08/2016 0755   BILITOT 0.3 04/04/2022 1118   BILITOT 0.42 09/08/2016 0755       Impression and Plan:   61 year old woman with:  1.  Melanoma of the lower extremity diagnosed in 2017.  She presented with stage IV disease and abdominal adenopathy in 2022.    Her disease status was updated at this time and treatment choices were discussed.  She continues to tolerate nivolumab without any complications.  Risks and benefits of continuing this treatment were reviewed.  The duration of therapy was discussed and that tentatively 2 years of therapy will be concluded August 2024 as long as she is responding.  We will update staging scans in October 2023.  2.  IV access: Peripheral veins are currently in use without any issues.  3.  Antiemetics: No nausea or vomiting reported at this time.  Compazine is available to her   4.  Autoimmune complications: I continue to educate about potential issues including pneumonitis, colitis and thyroid disease.  5.  CNS staging: We will update staging scans in the future if he has progression of disease.  6.  Hip pain/thigh pain: Unrelated to malignancy based on multiple imaging studies.  7.  Prognosis and goals of care: Disease is incurable but aggressive measures are warranted given her excellent  response.  8.  Follow-up: In 4 weeks for the next cycle of therapy.     30  minutes were dedicated to this encounter.  Time spent on reviewing laboratory data, disease status update and outlining future plan of care discussion.   Zola Button, MD 05/03/2022 11:46 AM

## 2022-05-04 DIAGNOSIS — Z85820 Personal history of malignant melanoma of skin: Secondary | ICD-10-CM | POA: Diagnosis not present

## 2022-05-04 DIAGNOSIS — B078 Other viral warts: Secondary | ICD-10-CM | POA: Diagnosis not present

## 2022-05-04 DIAGNOSIS — D224 Melanocytic nevi of scalp and neck: Secondary | ICD-10-CM | POA: Diagnosis not present

## 2022-05-04 DIAGNOSIS — D2262 Melanocytic nevi of left upper limb, including shoulder: Secondary | ICD-10-CM | POA: Diagnosis not present

## 2022-05-04 DIAGNOSIS — D2261 Melanocytic nevi of right upper limb, including shoulder: Secondary | ICD-10-CM | POA: Diagnosis not present

## 2022-05-05 ENCOUNTER — Telehealth: Payer: Self-pay | Admitting: Oncology

## 2022-05-05 NOTE — Telephone Encounter (Signed)
Scheduled per 07/19 los, patient has been called and voicemail was left. 

## 2022-05-08 ENCOUNTER — Other Ambulatory Visit: Payer: Self-pay

## 2022-05-16 DIAGNOSIS — R7309 Other abnormal glucose: Secondary | ICD-10-CM | POA: Diagnosis not present

## 2022-05-26 ENCOUNTER — Other Ambulatory Visit: Payer: Self-pay | Admitting: Oncology

## 2022-05-26 DIAGNOSIS — C439 Malignant melanoma of skin, unspecified: Secondary | ICD-10-CM

## 2022-05-28 ENCOUNTER — Other Ambulatory Visit: Payer: Self-pay | Admitting: Oncology

## 2022-05-30 ENCOUNTER — Inpatient Hospital Stay: Payer: BC Managed Care – PPO | Attending: Oncology

## 2022-05-30 ENCOUNTER — Other Ambulatory Visit: Payer: Self-pay

## 2022-05-30 ENCOUNTER — Inpatient Hospital Stay (HOSPITAL_BASED_OUTPATIENT_CLINIC_OR_DEPARTMENT_OTHER): Payer: BC Managed Care – PPO | Admitting: Oncology

## 2022-05-30 ENCOUNTER — Inpatient Hospital Stay: Payer: BC Managed Care – PPO

## 2022-05-30 VITALS — BP 108/74 | HR 62 | Temp 98.2°F | Resp 18

## 2022-05-30 VITALS — BP 104/74 | HR 57 | Temp 97.5°F | Resp 20 | Wt 158.5 lb

## 2022-05-30 DIAGNOSIS — C4370 Malignant melanoma of unspecified lower limb, including hip: Secondary | ICD-10-CM | POA: Insufficient documentation

## 2022-05-30 DIAGNOSIS — C439 Malignant melanoma of skin, unspecified: Secondary | ICD-10-CM | POA: Diagnosis not present

## 2022-05-30 DIAGNOSIS — Z5112 Encounter for antineoplastic immunotherapy: Secondary | ICD-10-CM | POA: Diagnosis not present

## 2022-05-30 DIAGNOSIS — Z79899 Other long term (current) drug therapy: Secondary | ICD-10-CM | POA: Diagnosis not present

## 2022-05-30 LAB — CBC WITH DIFFERENTIAL (CANCER CENTER ONLY)
Abs Immature Granulocytes: 0.01 10*3/uL (ref 0.00–0.07)
Basophils Absolute: 0 10*3/uL (ref 0.0–0.1)
Basophils Relative: 1 %
Eosinophils Absolute: 0.1 10*3/uL (ref 0.0–0.5)
Eosinophils Relative: 3 %
HCT: 40.8 % (ref 36.0–46.0)
Hemoglobin: 13.8 g/dL (ref 12.0–15.0)
Immature Granulocytes: 0 %
Lymphocytes Relative: 35 %
Lymphs Abs: 1.7 10*3/uL (ref 0.7–4.0)
MCH: 28.5 pg (ref 26.0–34.0)
MCHC: 33.8 g/dL (ref 30.0–36.0)
MCV: 84.3 fL (ref 80.0–100.0)
Monocytes Absolute: 0.3 10*3/uL (ref 0.1–1.0)
Monocytes Relative: 6 %
Neutro Abs: 2.6 10*3/uL (ref 1.7–7.7)
Neutrophils Relative %: 55 %
Platelet Count: 214 10*3/uL (ref 150–400)
RBC: 4.84 MIL/uL (ref 3.87–5.11)
RDW: 13.5 % (ref 11.5–15.5)
WBC Count: 4.8 10*3/uL (ref 4.0–10.5)
nRBC: 0 % (ref 0.0–0.2)

## 2022-05-30 LAB — CMP (CANCER CENTER ONLY)
ALT: 23 U/L (ref 0–44)
AST: 17 U/L (ref 15–41)
Albumin: 4.2 g/dL (ref 3.5–5.0)
Alkaline Phosphatase: 63 U/L (ref 38–126)
Anion gap: 6 (ref 5–15)
BUN: 16 mg/dL (ref 8–23)
CO2: 27 mmol/L (ref 22–32)
Calcium: 9.3 mg/dL (ref 8.9–10.3)
Chloride: 107 mmol/L (ref 98–111)
Creatinine: 0.65 mg/dL (ref 0.44–1.00)
GFR, Estimated: >60 mL/min (ref >60)
Glucose, Bld: 97 mg/dL (ref 70–99)
Potassium: 3.7 mmol/L (ref 3.5–5.1)
Sodium: 140 mmol/L (ref 135–145)
Total Bilirubin: 0.4 mg/dL (ref 0.3–1.2)
Total Protein: 6.9 g/dL (ref 6.5–8.1)

## 2022-05-30 LAB — TSH: TSH: 0.81 u[IU]/mL (ref 0.350–4.500)

## 2022-05-30 MED ORDER — SODIUM CHLORIDE 0.9 % IV SOLN: Freq: Once | INTRAVENOUS | Status: DC

## 2022-05-30 MED ORDER — SODIUM CHLORIDE 0.9 % IV SOLN
480.0000 mg | Freq: Once | INTRAVENOUS | Status: AC
  Administered 2022-05-30: 480 mg via INTRAVENOUS
  Filled 2022-05-30: qty 48

## 2022-05-30 NOTE — Progress Notes (Signed)
Hematology and Oncology Follow Up  Knox L Radke [id number] 01/01/1961 61 y.o. 05/30/2022 11:08 AM Bouska, David, MDBouska, David, MD       Principle Diagnosis: 61-year-old woman with melanoma of the lower extremity diagnosed in 2017.  She developed stage IV with abdominal adenopathy in 07/05/2021.  Secondary diagnoses:    1. Superficial spreading melanoma of the lower extremity diagnosed in August 2017. She was found to have 1.25 mm thickness without ulceration or lymphovascular invasion. She had 1 out of 2 lymph nodes positive. The size of the lymph node measuring 0.25 x 0.95 mm with pathological staging T2bN1.    2. Superficial spreading melanoma of the right shoulder diagnosed in August 2017.She is status post wide local excision and found tohave a 0.72 mm. These findings indicate a pathological staging of T1 NA.     Prior Therapy: She is status post local excision followed by sentinel lymph node sampling with 2 lymph nodes sampled one was involved with metastatic melanoma. The measurement was 0.25 x 0.95 mm in dimension of the capsule. Her final pathological stage stage is T2bN1. This was completed on 05/23/2016.    Nivolumab total of 240 mg dose every 2 weeks for total of 4 doses. Started on 07/21/2016.  She completed 4 cycles of therapy for a total of 2 months out of planned 12.  Patient opted out continuing immunotherapy.  She is status post lymph node biopsy completed on Feb 15, 2021.  Ipilimumab 3 mg/kg with a nivolumab at 1 mg/kg started on Mar 04, 2021.  She is status post 4 cycles of therapy.   Current therapy:   Nivolumab 480 mg every 4 weeks started on May 27, 2021.  She is here for the next cycle of therapy.  Interim History: Mrs. Godshall returns today for repeat evaluation.  Since the last visit, she reports no major complaints.  She denies any recent hospitalizations or illnesses.  She denies any complications related to nivolumab.  She has continues to have mild  alopecia on the top of the scalp but no other complaints.  Continues to be active and attends to activities of daily living.  She denies any skin rash, pruritus or diarrhea.  Medications: Reviewed without changes. Current Outpatient Medications  Medication Sig Dispense Refill   Biotin 1000 MCG CHEW Chew by mouth.     calcium carbonate (OS-CAL) 600 MG TABS Take 600 mg by mouth 2 (two) times daily with a meal.     cholecalciferol (VITAMIN D) 1000 UNITS tablet Take 5,000 Units by mouth daily.     Cyanocobalamin (VITAMIN B-12 CR PO) Take 1 tablet by mouth daily.     No current facility-administered medications for this visit.     Allergies:  Allergies  Allergen Reactions   Codeine Nausea Only and Other (See Comments)    Hallucinations; confusion   Physical exam    Blood pressure 104/74, pulse (!) 57, temperature (!) 97.5 F (36.4 C), resp. rate 20, weight 158 lb 8 oz (71.9 kg), last menstrual period 06/30/1990, SpO2 100 %.    ECOG 0   General appearance: Alert, awake without any distress. Head: Atraumatic without abnormalities Oropharynx: Without any thrush or ulcers. Eyes: No scleral icterus. Lymph nodes: No lymphadenopathy noted in the cervical, supraclavicular, or axillary nodes Heart:regular rate and rhythm, without any murmurs or gallops.   Lung: Clear to auscultation without any rhonchi, wheezes or dullness to percussion. Abdomin: Soft, nontender without any shifting dullness or ascites. Musculoskeletal: No clubbing or cyanosis.   Neurological: No motor or sensory deficits. Skin: No rashes or lesions.                 Lab Results: Lab Results  Component Value Date   WBC 4.3 05/03/2022   HGB 13.8 05/03/2022   HCT 41.1 05/03/2022   MCV 84.4 05/03/2022   PLT 219 05/03/2022     Chemistry      Component Value Date/Time   NA 140 05/03/2022 1132   NA 140 09/08/2016 0755   K 3.7 05/03/2022 1132   K 3.9 09/08/2016 0755   CL 105 05/03/2022 1132   CO2  30 05/03/2022 1132   CO2 23 09/08/2016 0755   BUN 12 05/03/2022 1132   BUN 11.2 09/08/2016 0755   CREATININE 0.53 05/03/2022 1132   CREATININE 0.61 09/30/2019 0839   CREATININE 0.7 09/08/2016 0755      Component Value Date/Time   CALCIUM 9.7 05/03/2022 1132   CALCIUM 9.1 09/08/2016 0755   ALKPHOS 65 05/03/2022 1132   ALKPHOS 61 09/08/2016 0755   AST 17 05/03/2022 1132   AST 13 09/08/2016 0755   ALT 18 05/03/2022 1132   ALT 16 09/08/2016 0755   BILITOT 0.4 05/03/2022 1132   BILITOT 0.42 09/08/2016 0755       Impression and Plan:   61 year old woman with:  1.  Stage IV melanoma with abdominal adenopathy diagnosed in 2022.    She continues to tolerate nivolumab without any major complications and continues to enjoy reasonable benefit.  Plan is to update her staging scan October 2023 and potentially continue with immunotherapy for the next 12 months.  Complications including autoimmune considerations among others were reiterated and alternative treatment options including BRAF targeted therapy if she harbors appropriate mutation.  She is agreeable to continue.  2.  IV access: No issues reported with peripheral veins.  3.  Antiemetics: Compazine is available to her without any nausea or vomiting.   4.  Autoimmune complications: These include pneumonitis, colitis, hypophysitis among others were reiterated.  She has not experienced any.  5.  CNS staging: No evidence of CNS metastasis noted.  We will continue to monitor.  6.  Hip pain/thigh pain: Imaging studies did not show any evidence of malignancy.  Appears to be musculoskeletal in nature.  7.  Prognosis and goals of care: Therapy remains palliative although aggressive measures are warranted.  8.  Follow-up: She will return next month for the next cycle of therapy.     30  minutes were spent on this visit.  The time was dedicated to reviewing disease status, treatment choices and future plan of care discussion.   Zola Button, MD 05/30/2022 11:08 AM

## 2022-05-30 NOTE — Patient Instructions (Signed)
[facility]   Discharge Instructions: Thank you for choosing [facility] to provide your oncology and hematology care.   If you have a lab appointment with the Cancer Center, please go directly to the Cancer Center and check in at the registration area.   Wear comfortable clothing and clothing appropriate for easy access to any Portacath or PICC line.   We strive to give you quality time with your provider. You may need to reschedule your appointment if you arrive late (15 or more minutes).  Arriving late affects you and other patients whose appointments are after yours.  Also, if you miss three or more appointments without notifying the office, you may be dismissed from the clinic at the provider's discretion.      For prescription refill requests, have your pharmacy contact our office and allow 72 hours for refills to be completed.    Today you received the following chemotherapy and/or immunotherapy agents: nivolumab      To help prevent nausea and vomiting after your treatment, we encourage you to take your nausea medication as directed.  BELOW ARE SYMPTOMS THAT SHOULD BE REPORTED IMMEDIATELY: *FEVER GREATER THAN 100.4 F (38 C) OR HIGHER *CHILLS OR SWEATING *NAUSEA AND VOMITING THAT IS NOT CONTROLLED WITH YOUR NAUSEA MEDICATION *UNUSUAL SHORTNESS OF BREATH *UNUSUAL BRUISING OR BLEEDING *URINARY PROBLEMS (pain or burning when urinating, or frequent urination) *BOWEL PROBLEMS (unusual diarrhea, constipation, pain near the anus) TENDERNESS IN MOUTH AND THROAT WITH OR WITHOUT PRESENCE OF ULCERS (sore throat, sores in mouth, or a toothache) UNUSUAL RASH, SWELLING OR PAIN  UNUSUAL VAGINAL DISCHARGE OR ITCHING   Items with * indicate a potential emergency and should be followed up as soon as possible or go to the Emergency Department if any problems should occur.  Please show the CHEMOTHERAPY ALERT CARD or IMMUNOTHERAPY ALERT CARD at check-in  to the Emergency Department and triage nurse.  Should you have questions after your visit or need to cancel or reschedule your appointment, please contact [facility]  Dept: [phone]  and follow the prompts.  Office hours are 8:00 a.m. to 4:30 p.m. Monday - Friday. Please note that voicemails left after 4:00 p.m. may not be returned until the following business day.  We are closed weekends and major holidays. You have access to a nurse at all times for urgent questions. Please call the main number to the clinic Dept: [phone] and follow the prompts.   For any non-urgent questions, you may also contact your provider using MyChart. We now offer e-Visits for anyone 18 and older to request care online for non-urgent symptoms. For details visit [email or web address].   Also download the MyChart app! Go to the app store, search "MyChart", open the app, select , and log in with your MyChart username and password.  Masks are optional in the cancer centers. If you would like for your care team to wear a mask while they are taking care of you, please let them know. You may have one support person who is at least 63 years old accompany you for your appointments. 

## 2022-05-31 LAB — T4: T4, Total: 7.4 ug/dL (ref 4.5–12.0)

## 2022-06-12 DIAGNOSIS — Z13220 Encounter for screening for lipoid disorders: Secondary | ICD-10-CM | POA: Diagnosis not present

## 2022-06-12 DIAGNOSIS — E042 Nontoxic multinodular goiter: Secondary | ICD-10-CM | POA: Diagnosis not present

## 2022-06-12 DIAGNOSIS — Z86718 Personal history of other venous thrombosis and embolism: Secondary | ICD-10-CM | POA: Diagnosis not present

## 2022-06-12 DIAGNOSIS — Z0000 Encounter for general adult medical examination without abnormal findings: Secondary | ICD-10-CM | POA: Diagnosis not present

## 2022-06-12 DIAGNOSIS — Z85820 Personal history of malignant melanoma of skin: Secondary | ICD-10-CM | POA: Diagnosis not present

## 2022-06-15 ENCOUNTER — Telehealth: Payer: Self-pay | Admitting: Oncology

## 2022-06-15 NOTE — Telephone Encounter (Signed)
Called patient regarding upcoming September appointment, patient is notified.  

## 2022-06-16 DIAGNOSIS — R7309 Other abnormal glucose: Secondary | ICD-10-CM | POA: Diagnosis not present

## 2022-06-27 ENCOUNTER — Telehealth: Payer: Self-pay | Admitting: *Deleted

## 2022-06-27 DIAGNOSIS — J069 Acute upper respiratory infection, unspecified: Secondary | ICD-10-CM | POA: Diagnosis not present

## 2022-06-27 NOTE — Telephone Encounter (Signed)
Sharon Kirk called to cancel tomorrow's treatment. States she has a 24 hour stomach bug. Need to reschedule or come in October as scheduled?

## 2022-06-27 NOTE — Telephone Encounter (Signed)
Notified to come in October as scheduled

## 2022-06-28 ENCOUNTER — Inpatient Hospital Stay: Payer: BC Managed Care – PPO | Admitting: Oncology

## 2022-06-28 ENCOUNTER — Inpatient Hospital Stay: Payer: BC Managed Care – PPO

## 2022-07-05 ENCOUNTER — Encounter: Payer: Self-pay | Admitting: Oncology

## 2022-07-16 DIAGNOSIS — R7309 Other abnormal glucose: Secondary | ICD-10-CM | POA: Diagnosis not present

## 2022-07-26 ENCOUNTER — Other Ambulatory Visit: Payer: BC Managed Care – PPO

## 2022-07-26 ENCOUNTER — Inpatient Hospital Stay: Payer: BC Managed Care – PPO

## 2022-07-26 ENCOUNTER — Inpatient Hospital Stay: Payer: BC Managed Care – PPO | Attending: Oncology | Admitting: Oncology

## 2022-07-26 ENCOUNTER — Ambulatory Visit: Payer: BC Managed Care – PPO

## 2022-07-26 VITALS — BP 108/74 | HR 66 | Temp 97.2°F | Resp 15 | Wt 159.0 lb

## 2022-07-26 DIAGNOSIS — L638 Other alopecia areata: Secondary | ICD-10-CM | POA: Diagnosis not present

## 2022-07-26 DIAGNOSIS — B078 Other viral warts: Secondary | ICD-10-CM | POA: Diagnosis not present

## 2022-07-26 DIAGNOSIS — C439 Malignant melanoma of skin, unspecified: Secondary | ICD-10-CM

## 2022-07-26 DIAGNOSIS — Z79899 Other long term (current) drug therapy: Secondary | ICD-10-CM | POA: Insufficient documentation

## 2022-07-26 DIAGNOSIS — D225 Melanocytic nevi of trunk: Secondary | ICD-10-CM | POA: Diagnosis not present

## 2022-07-26 DIAGNOSIS — Z5112 Encounter for antineoplastic immunotherapy: Secondary | ICD-10-CM | POA: Insufficient documentation

## 2022-07-26 DIAGNOSIS — C4370 Malignant melanoma of unspecified lower limb, including hip: Secondary | ICD-10-CM | POA: Diagnosis not present

## 2022-07-26 DIAGNOSIS — Z85820 Personal history of malignant melanoma of skin: Secondary | ICD-10-CM | POA: Diagnosis not present

## 2022-07-26 DIAGNOSIS — D2261 Melanocytic nevi of right upper limb, including shoulder: Secondary | ICD-10-CM | POA: Diagnosis not present

## 2022-07-26 LAB — CMP (CANCER CENTER ONLY)
ALT: 20 U/L (ref 0–44)
AST: 15 U/L (ref 15–41)
Albumin: 4.6 g/dL (ref 3.5–5.0)
Alkaline Phosphatase: 72 U/L (ref 38–126)
Anion gap: 6 (ref 5–15)
BUN: 9 mg/dL (ref 8–23)
CO2: 30 mmol/L (ref 22–32)
Calcium: 9.4 mg/dL (ref 8.9–10.3)
Chloride: 104 mmol/L (ref 98–111)
Creatinine: 0.52 mg/dL (ref 0.44–1.00)
GFR, Estimated: >60 mL/min (ref >60)
Glucose, Bld: 87 mg/dL (ref 70–99)
Potassium: 3.9 mmol/L (ref 3.5–5.1)
Sodium: 140 mmol/L (ref 135–145)
Total Bilirubin: 0.3 mg/dL (ref 0.3–1.2)
Total Protein: 7.5 g/dL (ref 6.5–8.1)

## 2022-07-26 LAB — CBC WITH DIFFERENTIAL (CANCER CENTER ONLY)
Abs Immature Granulocytes: 0.01 10*3/uL (ref 0.00–0.07)
Basophils Absolute: 0 10*3/uL (ref 0.0–0.1)
Basophils Relative: 1 %
Eosinophils Absolute: 0.1 10*3/uL (ref 0.0–0.5)
Eosinophils Relative: 3 %
HCT: 42 % (ref 36.0–46.0)
Hemoglobin: 14.1 g/dL (ref 12.0–15.0)
Immature Granulocytes: 0 %
Lymphocytes Relative: 39 %
Lymphs Abs: 1.6 10*3/uL (ref 0.7–4.0)
MCH: 28.5 pg (ref 26.0–34.0)
MCHC: 33.6 g/dL (ref 30.0–36.0)
MCV: 85 fL (ref 80.0–100.0)
Monocytes Absolute: 0.3 10*3/uL (ref 0.1–1.0)
Monocytes Relative: 7 %
Neutro Abs: 2 10*3/uL (ref 1.7–7.7)
Neutrophils Relative %: 50 %
Platelet Count: 196 10*3/uL (ref 150–400)
RBC: 4.94 MIL/uL (ref 3.87–5.11)
RDW: 13.5 % (ref 11.5–15.5)
WBC Count: 4 10*3/uL (ref 4.0–10.5)
nRBC: 0 % (ref 0.0–0.2)

## 2022-07-26 LAB — TSH: TSH: 2.881 u[IU]/mL (ref 0.350–4.500)

## 2022-07-26 MED ORDER — SODIUM CHLORIDE 0.9 % IV SOLN: Freq: Once | INTRAVENOUS | Status: AC

## 2022-07-26 MED ORDER — SODIUM CHLORIDE 0.9 % IV SOLN
480.0000 mg | Freq: Once | INTRAVENOUS | Status: AC
  Administered 2022-07-26: 480 mg via INTRAVENOUS
  Filled 2022-07-26: qty 48

## 2022-07-26 NOTE — Patient Instructions (Signed)
[facility] MEDICAL [facility]COL[facility]   Discharge Instructions: Thank you for choosing [facility] to provide your oncology and hematology care.   If you have a lab appointment with the Cancer Center, please go directly to the Cancer Center and check in at the registration area.   Wear comfortable clothing and clothing appropriate for easy access to any Portacath or PICC line.   We strive to give you quality time with your provider. You may need to reschedule your appointment if you arrive late (15 or more minutes).  Arriving late affects you and other patients whose appointments are after yours.  Also, if you miss three or more appointments without notifying the office, you may be dismissed from the clinic at the provider's discretion.      For prescription refill requests, have your pharmacy contact our office and allow 72 hours for refills to be completed.    Today you received the following chemotherapy and/or immunotherapy agents: nivolumab      To help prevent nausea and vomiting after your treatment, we encourage you to take your nausea medication as directed.  BELOW ARE SYMPTOMS THAT SHOULD BE REPORTED IMMEDIATELY: *FEVER GREATER THAN 100.4 F (38 C) OR HIGHER *CHILLS OR SWEATING *NAUSEA AND VOMITING THAT IS NOT CONTROLLED WITH YOUR NAUSEA MEDICATION *UNUSUAL SHORTNESS OF BREATH *UNUSUAL BRUISING OR BLEEDING *URINARY PROBLEMS (pain or burning when urinating, or frequent urination) *BOWEL PROBLEMS (unusual diarrhea, constipation, pain near the anus) TENDERNESS IN MOUTH AND THROAT WITH OR WITHOUT PRESENCE OF ULCERS (sore throat, sores in mouth, or a toothache) UNUSUAL RASH, SWELLING OR PAIN  UNUSUAL VAGINAL DISCHARGE OR ITCHING   Items with * indicate a potential emergency and should be followed up as soon as possible or go to the Emergency Department if any problems should occur.  Please show the CHEMOTHERAPY ALERT CARD or IMMUNOTHERAPY ALERT CARD at check-in  to the Emergency Department and triage nurse.  Should you have questions after your visit or need to cancel or reschedule your appointment, please contact [facility]  Dept: [phone]  and follow the prompts.  Office hours are 8:00 a.m. to 4:30 p.m. Monday - Friday. Please note that voicemails left after 4:00 p.m. may not be returned until the following business day.  We are closed weekends and major holidays. You have access to a nurse at all times for urgent questions. Please call the main number to the clinic Dept: [phone] and follow the prompts.   For any non-urgent questions, you may also contact your provider using MyChart. We now offer e-Visits for anyone 18 and older to request care online for non-urgent symptoms. For details visit [email or web address].   Also download the MyChart app! Go to the app store, search "MyChart", open the app, select [facility], and log in with your MyChart username and password.  Masks are optional in the cancer centers. If you would like for your care team to wear a mask while they are taking care of you, please let them know. You may have one support Kaytlen Lightsey who is at least 63 years old accompany you for your appointments. 

## 2022-07-26 NOTE — Progress Notes (Signed)
Hematology and Oncology Follow Up  Sharon Kirk [id number] May 03, 1961 61 y.o. 07/26/2022 11:24 AM Sharon Kirk, MDShadad, Sharon Dad, MD       Principle Diagnosis: 73 year old woman with stage IV melanoma involving abdominal adenopathy diagnosed in May of 2022.  She initially presented with localized disease involving the lower extremity in August 2017.   Secondary diagnoses:    1. Superficial spreading melanoma of the lower extremity diagnosed in August 2017. She was found to have 1.25 mm thickness without ulceration or lymphovascular invasion. She had 1 out of 2 lymph nodes positive. The size of the lymph node measuring 0.25 x 0.95 mm with pathological staging T2bN1.    2. Superficial spreading melanoma of the right shoulder diagnosed in August 2017.She is status post wide local excision and found tohave a 0.72 mm. These findings indicate a pathological staging of T1 NA.     Prior Therapy: She is status post local excision followed by sentinel lymph node sampling with 2 lymph nodes sampled one was involved with metastatic melanoma. The measurement was 0.25 x 0.95 mm in dimension of the capsule. Her final pathological stage stage is T2bN1. This was completed on 05/23/2016.    Nivolumab total of 240 mg dose every 2 weeks for total of 4 doses. Started on 07/21/2016.  She completed 4 cycles of therapy for a total of 2 months out of planned 12.  Patient opted out continuing immunotherapy.  She is status post lymph node biopsy completed on Feb 15, 2021.  Ipilimumab 3 mg/kg with a nivolumab at 1 mg/kg started on Mar 04, 2021.  She is status post 4 cycles of therapy.   Current therapy:   Nivolumab 480 mg every 4 weeks started on May 27, 2021.  She presents today for a repeat follow-up in the next cycle of therapy.  Interim History: Sharon Kirk returns today for a follow-up.  Since the last visit, she reports no major changes in her health.  She continues to tolerate nivolumab without any  major complaints.  She continues to have a loss of her hair and isolated area on her scalp but no other dermatological or GI toxicities.  She denies any nausea, diarrhea or respiratory complaints.  Medications: Updated on review. Current Outpatient Medications  Medication Sig Dispense Refill   Biotin 1000 MCG CHEW Chew by mouth.     calcium carbonate (OS-CAL) 600 MG TABS Take 600 mg by mouth 2 (two) times daily with a meal.     cholecalciferol (VITAMIN D) 1000 UNITS tablet Take 5,000 Units by mouth daily.     Cyanocobalamin (VITAMIN B-12 CR PO) Take 1 tablet by mouth daily.     No current facility-administered medications for this visit.     Allergies:  Allergies  Allergen Reactions   Codeine Nausea Only and Other (See Comments)    Hallucinations; confusion   Physical exam        ECOG 0    General appearance: Comfortable appearing without any discomfort Head: Normocephalic without any trauma Oropharynx: Mucous membranes are moist and pink without any thrush or ulcers. Eyes: Pupils are equal and round reactive to light. Lymph nodes: No cervical, supraclavicular, inguinal or axillary lymphadenopathy.   Heart:regular rate and rhythm.  S1 and S2 without leg edema. Lung: Clear without any rhonchi or wheezes.  No dullness to percussion. Abdomin: Soft, nontender, nondistended with good bowel sounds.  No hepatosplenomegaly. Musculoskeletal: No joint deformity or effusion.  Full range of motion noted. Neurological: No deficits noted  on motor, sensory and deep tendon reflex exam. Skin: No scalp lesions but loss of hair noted an isolated area on top of her scalp.                 Lab Results: Lab Results  Component Value Date   WBC 4.0 07/26/2022   HGB 14.1 07/26/2022   HCT 42.0 07/26/2022   MCV 85.0 07/26/2022   PLT 196 07/26/2022     Chemistry      Component Value Date/Time   NA 140 05/30/2022 1124   NA 140 09/08/2016 0755   K 3.7 05/30/2022 1124   K  3.9 09/08/2016 0755   CL 107 05/30/2022 1124   CO2 27 05/30/2022 1124   CO2 23 09/08/2016 0755   BUN 16 05/30/2022 1124   BUN 11.2 09/08/2016 0755   CREATININE 0.65 05/30/2022 1124   CREATININE 0.61 09/30/2019 0839   CREATININE 0.7 09/08/2016 0755      Component Value Date/Time   CALCIUM 9.3 05/30/2022 1124   CALCIUM 9.1 09/08/2016 0755   ALKPHOS 63 05/30/2022 1124   ALKPHOS 61 09/08/2016 0755   AST 17 05/30/2022 1124   AST 13 09/08/2016 0755   ALT 23 05/30/2022 1124   ALT 16 09/08/2016 0755   BILITOT 0.4 05/30/2022 1124   BILITOT 0.42 09/08/2016 0755       Impression and Plan:   61 year old woman with:  1.  Melanoma of the lower extremity diagnosed in 2017.  She was found to have stage IV and abdominal adenopathy documented in May 2022.  She continues to be on nivolumab maintenance at this time without any major complications.  I recommended updating her staging scans before the next visit.  Alternative service therapy options including BRAF targeted therapy among others will be considered at that time if she has relapsed disease.  Discontinuation of urinary therapy could be considered if she has complete response and completes 2 years of immunotherapy.  2.  IV access: Peripheral veins are currently in use without any issues.   3.  Antiemetics: No nausea or vomiting reported at this time.  Compazine is available to her.   4.  Autoimmune complications: She has not experienced any issues at this time.  I continue to educate her about pneumonitis, colitis, hepatitis and thyroid disease.  5.  CNS staging: Her initial CNS staging was negative.  We will continue to monitor at future evaluations.   6.  Dermatology surveillance: She is up-to-date and has a visit later on today.  I recommend continuing this indefinitely.  7.  Prognosis and goals of care: Therapy remains palliative although aggressive measures are warranted given her excellent response to therapy that could  result in long-term disease control.  8.  Follow-up: In 4 weeks for the next cycle of therapy.     30  minutes were dedicated to this encounter.  The time was spent on updating disease status, treatment choices and outlining future plan of care reviewed.   Sharon Button, MD 07/26/2022 11:24 AM

## 2022-07-27 ENCOUNTER — Encounter: Payer: Self-pay | Admitting: Oncology

## 2022-07-28 LAB — T4: T4, Total: 7.4 ug/dL (ref 4.5–12.0)

## 2022-08-05 ENCOUNTER — Other Ambulatory Visit: Payer: Self-pay

## 2022-08-16 ENCOUNTER — Ambulatory Visit (HOSPITAL_COMMUNITY)
Admission: RE | Admit: 2022-08-16 | Discharge: 2022-08-16 | Disposition: A | Discharge status: Home / Self Care | Payer: BC Managed Care – PPO | Source: Ambulatory Visit | Attending: Oncology | Admitting: Oncology

## 2022-08-16 DIAGNOSIS — C439 Malignant melanoma of skin, unspecified: Secondary | ICD-10-CM | POA: Insufficient documentation

## 2022-08-16 DIAGNOSIS — J9811 Atelectasis: Secondary | ICD-10-CM | POA: Diagnosis not present

## 2022-08-16 DIAGNOSIS — R7309 Other abnormal glucose: Secondary | ICD-10-CM | POA: Diagnosis not present

## 2022-08-16 DIAGNOSIS — D7389 Other diseases of spleen: Secondary | ICD-10-CM | POA: Diagnosis not present

## 2022-08-16 DIAGNOSIS — I700 Atherosclerosis of aorta: Secondary | ICD-10-CM | POA: Diagnosis not present

## 2022-08-16 DIAGNOSIS — R590 Localized enlarged lymph nodes: Secondary | ICD-10-CM | POA: Diagnosis not present

## 2022-08-16 MED ORDER — IOHEXOL 300 MG/ML  SOLN
100.0000 mL | Freq: Once | INTRAMUSCULAR | Status: AC | PRN
  Administered 2022-08-16: 100 mL via INTRAVENOUS

## 2022-08-16 MED ORDER — SODIUM CHLORIDE (PF) 0.9 % IJ SOLN
INTRAMUSCULAR | Status: AC
  Filled 2022-08-16: qty 50

## 2022-08-23 ENCOUNTER — Ambulatory Visit: Payer: BC Managed Care – PPO

## 2022-08-23 ENCOUNTER — Other Ambulatory Visit: Payer: BC Managed Care – PPO

## 2022-08-23 ENCOUNTER — Ambulatory Visit: Payer: BC Managed Care – PPO | Admitting: Oncology

## 2022-08-25 DIAGNOSIS — C779 Secondary and unspecified malignant neoplasm of lymph node, unspecified: Secondary | ICD-10-CM | POA: Diagnosis not present

## 2022-08-25 DIAGNOSIS — J0100 Acute maxillary sinusitis, unspecified: Secondary | ICD-10-CM | POA: Diagnosis not present

## 2022-08-29 ENCOUNTER — Inpatient Hospital Stay: Payer: BC Managed Care – PPO | Attending: Oncology

## 2022-08-29 ENCOUNTER — Inpatient Hospital Stay: Payer: BC Managed Care – PPO

## 2022-08-29 ENCOUNTER — Inpatient Hospital Stay (HOSPITAL_BASED_OUTPATIENT_CLINIC_OR_DEPARTMENT_OTHER): Payer: BC Managed Care – PPO | Admitting: Oncology

## 2022-08-29 VITALS — BP 100/72 | HR 72 | Temp 98.0°F | Resp 17 | Ht 65.0 in | Wt 161.8 lb

## 2022-08-29 DIAGNOSIS — C4370 Malignant melanoma of unspecified lower limb, including hip: Secondary | ICD-10-CM | POA: Insufficient documentation

## 2022-08-29 DIAGNOSIS — Z5112 Encounter for antineoplastic immunotherapy: Secondary | ICD-10-CM | POA: Insufficient documentation

## 2022-08-29 DIAGNOSIS — C439 Malignant melanoma of skin, unspecified: Secondary | ICD-10-CM

## 2022-08-29 DIAGNOSIS — Z79899 Other long term (current) drug therapy: Secondary | ICD-10-CM | POA: Insufficient documentation

## 2022-08-29 LAB — CBC WITH DIFFERENTIAL (CANCER CENTER ONLY)
Abs Immature Granulocytes: 0.01 10*3/uL (ref 0.00–0.07)
Basophils Absolute: 0 10*3/uL (ref 0.0–0.1)
Basophils Relative: 1 %
Eosinophils Absolute: 0.1 10*3/uL (ref 0.0–0.5)
Eosinophils Relative: 3 %
HCT: 40.5 % (ref 36.0–46.0)
Hemoglobin: 13.9 g/dL (ref 12.0–15.0)
Immature Granulocytes: 0 %
Lymphocytes Relative: 31 %
Lymphs Abs: 1.4 10*3/uL (ref 0.7–4.0)
MCH: 29 pg (ref 26.0–34.0)
MCHC: 34.3 g/dL (ref 30.0–36.0)
MCV: 84.6 fL (ref 80.0–100.0)
Monocytes Absolute: 0.3 10*3/uL (ref 0.1–1.0)
Monocytes Relative: 6 %
Neutro Abs: 2.6 10*3/uL (ref 1.7–7.7)
Neutrophils Relative %: 59 %
Platelet Count: 242 10*3/uL (ref 150–400)
RBC: 4.79 MIL/uL (ref 3.87–5.11)
RDW: 13.4 % (ref 11.5–15.5)
WBC Count: 4.4 10*3/uL (ref 4.0–10.5)
nRBC: 0 % (ref 0.0–0.2)

## 2022-08-29 LAB — CMP (CANCER CENTER ONLY)
ALT: 19 U/L (ref 0–44)
AST: 15 U/L (ref 15–41)
Albumin: 4.5 g/dL (ref 3.5–5.0)
Alkaline Phosphatase: 76 U/L (ref 38–126)
Anion gap: 8 (ref 5–15)
BUN: 14 mg/dL (ref 8–23)
CO2: 28 mmol/L (ref 22–32)
Calcium: 9.3 mg/dL (ref 8.9–10.3)
Chloride: 103 mmol/L (ref 98–111)
Creatinine: 0.55 mg/dL (ref 0.44–1.00)
GFR, Estimated: >60 mL/min (ref >60)
Glucose, Bld: 99 mg/dL (ref 70–99)
Potassium: 3.6 mmol/L (ref 3.5–5.1)
Sodium: 139 mmol/L (ref 135–145)
Total Bilirubin: 0.3 mg/dL (ref 0.3–1.2)
Total Protein: 7.3 g/dL (ref 6.5–8.1)

## 2022-08-29 LAB — TSH: TSH: 3.055 u[IU]/mL (ref 0.350–4.500)

## 2022-08-29 MED ORDER — SODIUM CHLORIDE 0.9 % IV SOLN: Freq: Once | INTRAVENOUS | Status: AC

## 2022-08-29 MED ORDER — SODIUM CHLORIDE 0.9 % IV SOLN
480.0000 mg | Freq: Once | INTRAVENOUS | Status: AC
  Administered 2022-08-29: 480 mg via INTRAVENOUS
  Filled 2022-08-29: qty 48

## 2022-08-29 NOTE — Patient Instructions (Signed)
[facility]COL[facility]   Discharge Instructions: Thank you for choosing [facility] to provide your oncology and hematology care.   If you have a lab appointment with the Cancer Center, please go directly to the Cancer Center and check in at the registration area.   Wear comfortable clothing and clothing appropriate for easy access to any Portacath or PICC line.   We strive to give you quality time with your provider. You may need to reschedule your appointment if you arrive late (15 or more minutes).  Arriving late affects you and other patients whose appointments are after yours.  Also, if you miss three or more appointments without notifying the office, you may be dismissed from the clinic at the provider's discretion.      For prescription refill requests, have your pharmacy contact our office and allow 72 hours for refills to be completed.    Today you received the following chemotherapy and/or immunotherapy agents: nivolumab      To help prevent nausea and vomiting after your treatment, we encourage you to take your nausea medication as directed.  BELOW ARE SYMPTOMS THAT SHOULD BE REPORTED IMMEDIATELY: *FEVER GREATER THAN 100.4 F (38 C) OR HIGHER *CHILLS OR SWEATING *NAUSEA AND VOMITING THAT IS NOT CONTROLLED WITH YOUR NAUSEA MEDICATION *UNUSUAL SHORTNESS OF BREATH *UNUSUAL BRUISING OR BLEEDING *URINARY PROBLEMS (pain or burning when urinating, or frequent urination) *BOWEL PROBLEMS (unusual diarrhea, constipation, pain near the anus) TENDERNESS IN MOUTH AND THROAT WITH OR WITHOUT PRESENCE OF ULCERS (sore throat, sores in mouth, or a toothache) UNUSUAL RASH, SWELLING OR PAIN  UNUSUAL VAGINAL DISCHARGE OR ITCHING   Items with * indicate a potential emergency and should be followed up as soon as possible or go to the Emergency Department if any problems should occur.  Please show the CHEMOTHERAPY ALERT CARD or IMMUNOTHERAPY ALERT CARD at check-in  to the Emergency Department and triage nurse.  Should you have questions after your visit or need to cancel or reschedule your appointment, please contact [facility]  Dept: [phone]  and follow the prompts.  Office hours are 8:00 a.m. to 4:30 p.m. Monday - Friday. Please note that voicemails left after 4:00 p.m. may not be returned until the following business day.  We are closed weekends and major holidays. You have access to a nurse at all times for urgent questions. Please call the main number to the clinic Dept: [phone] and follow the prompts.   For any non-urgent questions, you may also contact your provider using MyChart. We now offer e-Visits for anyone 18 and older to request care online for non-urgent symptoms. For details visit [email or web address].   Also download the MyChart app! Go to the app store, search "MyChart", open the app, select , and log in with your MyChart username and password.  Masks are optional in the cancer centers. If you would like for your care team to wear a mask while they are taking care of you, please let them know. You may have one support Gunter Conde who is at least 63 years old accompany you for your appointments. 

## 2022-08-29 NOTE — Progress Notes (Signed)
Hematology and Oncology Follow Up  Sharon Kirk [id number] 1961/05/19 61 y.o. 08/29/2022 11:00 AM Bernerd Limbo, MDShadad, Mathis Dad, MD       Principle Diagnosis: 18 year old woman with melanoma of the lower extremity diagnosed in 2017.  She developed stage IV with abdominal adenopathy in May of 2022.  7.   Secondary diagnoses:    1. Superficial spreading melanoma of the lower extremity diagnosed in August 2017. She was found to have 1.25 mm thickness without ulceration or lymphovascular invasion. She had 1 out of 2 lymph nodes positive. The size of the lymph node measuring 0.25 x 0.95 mm with pathological staging T2bN1.    2. Superficial spreading melanoma of the right shoulder diagnosed in August 2017.She is status post wide local excision and found tohave a 0.72 mm. These findings indicate a pathological staging of T1 NA.     Prior Therapy: She is status post local excision followed by sentinel lymph node sampling with 2 lymph nodes sampled one was involved with metastatic melanoma. The measurement was 0.25 x 0.95 mm in dimension of the capsule. Her final pathological stage stage is T2bN1. This was completed on 05/23/2016.    Nivolumab total of 240 mg dose every 2 weeks for total of 4 doses. Started on 07/21/2016.  She completed 4 cycles of therapy for a total of 2 months out of planned 12.  Patient opted out continuing immunotherapy.  She is status post lymph node biopsy completed on Feb 15, 2021.  Ipilimumab 3 mg/kg with a nivolumab at 1 mg/kg started on Mar 04, 2021.  She is status post 4 cycles of therapy.   Current therapy:   Nivolumab 480 mg every 4 weeks started on May 27, 2021.  She is here for repeat evaluation in the next cycle of therapy.  Interim History: Sharon Kirk presents today for 8-week follow-up.  Since the last visit, she reports feeling well without any major complaints.  Denies any nausea, vomiting or abdominal pain.  She denies any hospitalizations or  illnesses.  She denies any skin rashes or lesions.  She does have 1 isolated area of hair loss which has not changed dramatically.  Medications: Reviewed with changes. Current Outpatient Medications  Medication Sig Dispense Refill   Biotin 1000 MCG CHEW Chew by mouth.     calcium carbonate (OS-CAL) 600 MG TABS Take 600 mg by mouth 2 (two) times daily with a meal.     cholecalciferol (VITAMIN D) 1000 UNITS tablet Take 5,000 Units by mouth daily.     Cyanocobalamin (VITAMIN B-12 CR PO) Take 1 tablet by mouth daily.     No current facility-administered medications for this visit.     Allergies:  Allergies  Allergen Reactions   Codeine Nausea Only and Other (See Comments)    Hallucinations; confusion   Physical exam     Blood pressure 100/72, pulse 72, temperature 98 F (36.7 C), temperature source Temporal, resp. rate 17, height '5\' 5"'$  (1.651 m), weight 161 lb 12.8 oz (73.4 kg), last menstrual period 06/30/1990, SpO2 97 %.    ECOG 0   General appearance: Alert, awake without any distress. Head: Atraumatic without abnormalities Oropharynx: Without any thrush or ulcers. Eyes: No scleral icterus. Lymph nodes: No lymphadenopathy noted in the cervical, supraclavicular, or axillary nodes Heart:regular rate and rhythm, without any murmurs or gallops.   Lung: Clear to auscultation without any rhonchi, wheezes or dullness to percussion. Abdomin: Soft, nontender without any shifting dullness or ascites. Musculoskeletal: No clubbing  or cyanosis. Neurological: No motor or sensory deficits. Skin: No rashes or lesions.                  Lab Results: Lab Results  Component Value Date   WBC 4.0 07/26/2022   HGB 14.1 07/26/2022   HCT 42.0 07/26/2022   MCV 85.0 07/26/2022   PLT 196 07/26/2022     Chemistry      Component Value Date/Time   NA 140 07/26/2022 1102   NA 140 09/08/2016 0755   K 3.9 07/26/2022 1102   K 3.9 09/08/2016 0755   CL 104 07/26/2022 1102    CO2 30 07/26/2022 1102   CO2 23 09/08/2016 0755   BUN 9 07/26/2022 1102   BUN 11.2 09/08/2016 0755   CREATININE 0.52 07/26/2022 1102   CREATININE 0.61 09/30/2019 0839   CREATININE 0.7 09/08/2016 0755      Component Value Date/Time   CALCIUM 9.4 07/26/2022 1102   CALCIUM 9.1 09/08/2016 0755   ALKPHOS 72 07/26/2022 1102   ALKPHOS 61 09/08/2016 0755   AST 15 07/26/2022 1102   AST 13 09/08/2016 0755   ALT 20 07/26/2022 1102   ALT 16 09/08/2016 0755   BILITOT 0.3 07/26/2022 1102   BILITOT 0.42 09/08/2016 0755      Narrative & Impression  CLINICAL DATA:  Melanoma; * Tracking Code: BO *   EXAM: CT CHEST, ABDOMEN, AND PELVIS WITH CONTRAST   TECHNIQUE: Multidetector CT imaging of the chest, abdomen and pelvis was performed following the standard protocol during bolus administration of intravenous contrast.   RADIATION DOSE REDUCTION: This exam was performed according to the departmental dose-optimization program which includes automated exposure control, adjustment of the mA and/or kV according to patient size and/or use of iterative reconstruction technique.   CONTRAST:  14m OMNIPAQUE IOHEXOL 300 MG/ML  SOLN   COMPARISON:  CT chest, abdomen and pelvis dated May 16th 2023   FINDINGS: CT CHEST FINDINGS   Cardiovascular: Normal heart size. No pericardial effusion. Normal caliber thoracic aorta with no significant atherosclerotic disease. No visible coronary artery calcifications.   Mediastinum/Nodes: Unchanged left-sided thyroid nodule. Esophagus is unremarkable. No pathologically enlarged lymph nodes seen in the chest. Reference precarinal lymph node measures 7 mm in short axis on series 2, image 25, unchanged.   Lungs/Pleura: Central airways are patent. Bibasilar atelectasis and lower lung predominant linear opacities which are likely due to scarring. Stable small pulmonary nodules. Reference ground-glass nodule right upper lobe measuring 7 mm on series 4, image  52, unchanged. Small solid pulmonary nodule of the superior portion of the right lower lobe measuring 2 mm on series 4, image 73, unchanged. No consolidation, pneumothorax or pleural effusion.   Musculoskeletal: No chest wall mass or suspicious bone lesions identified.   CT ABDOMEN PELVIS FINDINGS   Hepatobiliary: Previously described hepatic lesion not visible on today's exam. No suspicious focal liver abnormality is seen. No gallstones, gallbladder wall thickening, or biliary dilatation.   Pancreas: Unremarkable. No pancreatic ductal dilatation or surrounding inflammatory changes.   Spleen: Unchanged splenic lesions. Reference lesion measures 1.5 cm on series 2, image 54.   Adrenals/Urinary Tract: Bilateral adrenal glands are unremarkable. No hydronephrosis or nephrolithiasis. No suspicious renal lesions. Bladder is decompressed.   Stomach/Bowel: Stomach is within normal limits. Appendix appears normal. No evidence of bowel wall thickening, distention, or inflammatory changes.   Vascular/Lymphatic: Aortic atherosclerosis. Stable heterogeneous hyperattenuating retroperitoneal adenopathy. Reference left periaortic lymph node measuring 1.7 x 1.5 cm on series 2, image  76. Reference left external iliac lymph node measuring 1.1 x 1.0 cm on series 2, image 109.   Reproductive: No adnexal masses.   Other: No abdominal wall hernia or abnormality. No abdominopelvic ascites.   Musculoskeletal: No acute or significant osseous findings.   IMPRESSION: 1. No acute findings in the abdomen or pelvis. 2. Stable abdominal and pelvic retroperitoneal adenopathy. 3. Stable small pulmonary nodules. 4. Unchanged hypodense splenic lesions. 5. No evidence of new progressive disease in the chest, abdomen or pelvis. 6.  Aortic Atherosclerosis (ICD10-I70.0).   Impression and Plan:   61 year old woman with:  1. Stage IV melanoma arising from the lower extremity diagnosed with abdominal  adenopathy in May 2022.  CT scan obtained on August 16, 2022 was personally reviewed and discussed with the patient.  She continues to have very little residual disease with borderline involvement of the left periaortic lymph node.  Risks and benefits of continuing nivolumab maintenance were discussed at this time.  Potential complications that include autoimmune complications, GI toxicity among others were reviewed.  After discussion today, I have recommended continuing nivolumab maintenance till May 2024.  Repeat imaging studies at this time will be recommended will be 6 months from now.  2.  IV access: No issues reported with peripheral veins at this time.   3.  Antiemetics: Compazine is available without any nausea or vomiting.   4.  Autoimmune complications: He is complicated including pneumonitis, colitis and thyroid disease were reiterated.  She is not experienced any.  5.  CNS staging: No signs or symptoms of CNS metastasis.   6.  Dermatology surveillance: She continues to be up-to-date on her dermatology follow-up.  7.  Prognosis and goals of care: Aggressive measures are warranted given her excellent response.  Her disease is likely incurable.  8.  Follow-up: In 1 month for a follow-up.     30  minutes were spent on this visit.  The time was dedicated to reviewing her disease status, treatment choices and outlining future plan of care review.   Zola Button, MD 08/29/2022 11:00 AM

## 2022-08-30 ENCOUNTER — Other Ambulatory Visit: Payer: Self-pay

## 2022-09-11 DIAGNOSIS — J069 Acute upper respiratory infection, unspecified: Secondary | ICD-10-CM | POA: Diagnosis not present

## 2022-09-14 ENCOUNTER — Other Ambulatory Visit: Payer: Self-pay

## 2022-09-15 DIAGNOSIS — R7309 Other abnormal glucose: Secondary | ICD-10-CM | POA: Diagnosis not present

## 2022-09-18 ENCOUNTER — Other Ambulatory Visit: Payer: Self-pay

## 2022-09-20 ENCOUNTER — Inpatient Hospital Stay: Payer: BC Managed Care – PPO | Attending: Oncology

## 2022-09-20 ENCOUNTER — Inpatient Hospital Stay (HOSPITAL_BASED_OUTPATIENT_CLINIC_OR_DEPARTMENT_OTHER): Payer: BC Managed Care – PPO | Admitting: Oncology

## 2022-09-20 ENCOUNTER — Inpatient Hospital Stay: Payer: BC Managed Care – PPO

## 2022-09-20 VITALS — BP 98/61 | HR 64 | Temp 97.8°F | Resp 17

## 2022-09-20 VITALS — BP 91/62 | HR 66 | Temp 97.7°F | Resp 15 | Ht 65.0 in | Wt 163.5 lb

## 2022-09-20 DIAGNOSIS — C4370 Malignant melanoma of unspecified lower limb, including hip: Secondary | ICD-10-CM | POA: Diagnosis not present

## 2022-09-20 DIAGNOSIS — C439 Malignant melanoma of skin, unspecified: Secondary | ICD-10-CM

## 2022-09-20 DIAGNOSIS — Z79899 Other long term (current) drug therapy: Secondary | ICD-10-CM | POA: Insufficient documentation

## 2022-09-20 DIAGNOSIS — L659 Nonscarring hair loss, unspecified: Secondary | ICD-10-CM | POA: Diagnosis not present

## 2022-09-20 DIAGNOSIS — Z5112 Encounter for antineoplastic immunotherapy: Secondary | ICD-10-CM | POA: Diagnosis not present

## 2022-09-20 LAB — CBC WITH DIFFERENTIAL (CANCER CENTER ONLY)
Abs Immature Granulocytes: 0.01 10*3/uL (ref 0.00–0.07)
Basophils Absolute: 0.1 10*3/uL (ref 0.0–0.1)
Basophils Relative: 1 %
Eosinophils Absolute: 0.1 10*3/uL (ref 0.0–0.5)
Eosinophils Relative: 2 %
HCT: 41.5 % (ref 36.0–46.0)
Hemoglobin: 14 g/dL (ref 12.0–15.0)
Immature Granulocytes: 0 %
Lymphocytes Relative: 36 %
Lymphs Abs: 1.8 10*3/uL (ref 0.7–4.0)
MCH: 28.6 pg (ref 26.0–34.0)
MCHC: 33.7 g/dL (ref 30.0–36.0)
MCV: 84.9 fL (ref 80.0–100.0)
Monocytes Absolute: 0.3 10*3/uL (ref 0.1–1.0)
Monocytes Relative: 7 %
Neutro Abs: 2.7 10*3/uL (ref 1.7–7.7)
Neutrophils Relative %: 54 %
Platelet Count: 262 10*3/uL (ref 150–400)
RBC: 4.89 MIL/uL (ref 3.87–5.11)
RDW: 13.4 % (ref 11.5–15.5)
WBC Count: 4.9 10*3/uL (ref 4.0–10.5)
nRBC: 0 % (ref 0.0–0.2)

## 2022-09-20 LAB — CMP (CANCER CENTER ONLY)
ALT: 22 U/L (ref 0–44)
AST: 17 U/L (ref 15–41)
Albumin: 4.4 g/dL (ref 3.5–5.0)
Alkaline Phosphatase: 70 U/L (ref 38–126)
Anion gap: 7 (ref 5–15)
BUN: 13 mg/dL (ref 8–23)
CO2: 30 mmol/L (ref 22–32)
Calcium: 10.1 mg/dL (ref 8.9–10.3)
Chloride: 103 mmol/L (ref 98–111)
Creatinine: 0.61 mg/dL (ref 0.44–1.00)
GFR, Estimated: >60 mL/min (ref >60)
Glucose, Bld: 76 mg/dL (ref 70–99)
Potassium: 3.8 mmol/L (ref 3.5–5.1)
Sodium: 140 mmol/L (ref 135–145)
Total Bilirubin: 0.3 mg/dL (ref 0.3–1.2)
Total Protein: 7.3 g/dL (ref 6.5–8.1)

## 2022-09-20 LAB — TSH: TSH: 2.851 u[IU]/mL (ref 0.350–4.500)

## 2022-09-20 MED ORDER — SODIUM CHLORIDE 0.9 % IV SOLN
480.0000 mg | Freq: Once | INTRAVENOUS | Status: AC
  Administered 2022-09-20: 480 mg via INTRAVENOUS
  Filled 2022-09-20: qty 48

## 2022-09-20 MED ORDER — SODIUM CHLORIDE 0.9 % IV SOLN: Freq: Once | INTRAVENOUS | Status: AC

## 2022-09-20 NOTE — Progress Notes (Signed)
Hematology and Oncology Follow Up  Sharon Kirk [id number] 1961/09/16 61 y.o. 09/20/2022 11:43 AM Bernerd Limbo, MDShadad, Mathis Dad, MD       Principle Diagnosis: 73 year old woman with stage IV melanoma presented with abdominal adenopathy in May of 2022.  She initially was found to have lower extremity primary tumor with lymph node involvement in 2017.  Her tumor is BRAF negative.   Secondary diagnoses:    1. Superficial spreading melanoma of the lower extremity diagnosed in August 2017. She was found to have 1.25 mm thickness without ulceration or lymphovascular invasion. She had 1 out of 2 lymph nodes positive. The size of the lymph node measuring 0.25 x 0.95 mm with pathological staging T2bN1.    2. Superficial spreading melanoma of the right shoulder diagnosed in August 2017.She is status post wide local excision and found tohave a 0.72 mm. These findings indicate a pathological staging of T1 NA.     Prior Therapy: She is status post local excision followed by sentinel lymph node sampling with 2 lymph nodes sampled one was involved with metastatic melanoma. The measurement was 0.25 x 0.95 mm in dimension of the capsule. Her final pathological stage stage is T2bN1. This was completed on 05/23/2016.    Nivolumab total of 240 mg dose every 2 weeks for total of 4 doses. Started on 07/21/2016.  She completed 4 cycles of therapy for a total of 2 months out of planned 12.  Patient opted out continuing immunotherapy.  She is status post lymph node biopsy completed on Feb 15, 2021.  Ipilimumab 3 mg/kg with a nivolumab at 1 mg/kg started on Mar 04, 2021.  She is status post 4 cycles of therapy.   Current therapy:   Nivolumab 480 mg every 4 weeks started on May 27, 2021.  She returns for the next cycle of treatment the plan is to complete 2 years of therapy.  Her last treatment will be tentatively April 2024.  Interim History: Sharon Kirk returns today for a follow-up visit.  Since the  last visit, she reports no major changes in her health.  She continues to tolerate nivolumab without any major complications.  She did have issue of mild alopecia which has not changed dramatically.  She denies any nausea, vomiting or abdominal pain.  She denies skin rash or lesions.  She denies respiratory complaints or changes in bowel habits.   Medications: Updated on review. Current Outpatient Medications  Medication Sig Dispense Refill   Biotin 1000 MCG CHEW Chew by mouth.     calcium carbonate (OS-CAL) 600 MG TABS Take 600 mg by mouth 2 (two) times daily with a meal.     cholecalciferol (VITAMIN D) 1000 UNITS tablet Take 5,000 Units by mouth daily.     Cyanocobalamin (VITAMIN B-12 CR PO) Take 1 tablet by mouth daily.     No current facility-administered medications for this visit.     Allergies:  Allergies  Allergen Reactions   Codeine Nausea Only and Other (See Comments)    Hallucinations; confusion   Physical exam     Blood pressure 91/62, pulse 66, temperature 97.7 F (36.5 C), temperature source Temporal, resp. rate 15, height _0  (1.651 m), weight 163 lb 8 oz (74.2 kg), last menstrual period 06/30/1990, SpO2 99 %.     ECOG 0    General appearance: Comfortable appearing without any discomfort Head: Normocephalic without any trauma Oropharynx: Mucous membranes are moist and pink without any thrush or ulcers. Eyes: Pupils are  equal and round reactive to light. Lymph nodes: No cervical, supraclavicular, inguinal or axillary lymphadenopathy.   Heart:regular rate and rhythm.  S1 and S2 without leg edema. Lung: Clear without any rhonchi or wheezes.  No dullness to percussion. Abdomin: Soft, nontender, nondistended with good bowel sounds.  No hepatosplenomegaly. Musculoskeletal: No joint deformity or effusion.  Full range of motion noted. Neurological: No deficits noted on motor, sensory and deep tendon reflex exam. Skin: No petechial rash or dryness.  Appeared  moist.  Small area of scalp hair loss noted.                   Lab Results: Lab Results  Component Value Date   WBC 4.9 09/20/2022   HGB 14.0 09/20/2022   HCT 41.5 09/20/2022   MCV 84.9 09/20/2022   PLT 262 09/20/2022     Chemistry      Component Value Date/Time   NA 139 08/29/2022 1102   NA 140 09/08/2016 0755   K 3.6 08/29/2022 1102   K 3.9 09/08/2016 0755   CL 103 08/29/2022 1102   CO2 28 08/29/2022 1102   CO2 23 09/08/2016 0755   BUN 14 08/29/2022 1102   BUN 11.2 09/08/2016 0755   CREATININE 0.55 08/29/2022 1102   CREATININE 0.61 09/30/2019 0839   CREATININE 0.7 09/08/2016 0755      Component Value Date/Time   CALCIUM 9.3 08/29/2022 1102   CALCIUM 9.1 09/08/2016 0755   ALKPHOS 76 08/29/2022 1102   ALKPHOS 61 09/08/2016 0755   AST 15 08/29/2022 1102   AST 13 09/08/2016 0755   ALT 19 08/29/2022 1102   ALT 16 09/08/2016 0755   BILITOT 0.3 08/29/2022 1102   BILITOT 0.42 09/08/2016 0755       Impression and Plan:   61 year old woman with:  1.  Melanoma of the lower extremity diagnosed in 2017.  She developed stage IV with pelvic adenopathy and BRAF wild-type tumor in 2022.  She continues to be on nivolumab maintenance without any major complications.  Risks and benefits of continuing this treatment were reviewed.  The plan is to complete total of 2 years of immunotherapy which will conclude around May 2024.  CT scan obtained in November 2023 showed very minimal residual disease with a borderline lymph node enlargement.  She is agreeable to proceed with the treatment today and the overall plan.  Last treatment is tentatively to be given in April 2024.  She will have repeat scans at that time.  2.  IV access: Port-A-Cath option has been deferred at this time.  Peripheral veins are currently in use.   3.  Antiemetics: No nausea or vomiting reported at this time.  Compazine is available to her.  4.  Autoimmune complications: Continue to educate  her about potential complications.  These include pneumonitis, colitis, thyroid disease among others.  She does have mild alopecia but overall no other complaints.  5.  CNS staging: MRI of the brain obtained in May 2022 did not show any evidence of metastatic disease.  She has no signs or symptoms of recurrence.   6.  Dermatology surveillance: I recommended indefinite dermatology surveillance at this time.  7.  Prognosis and goals of care: Therapy is likely palliative although she had an excellent response to therapy and aggressive measures are warranted.  8.  Follow-up: She will return in 4 weeks for a follow-up.     30  minutes were dedicated to this encounter.  This time spent on reviewing  her disease status, treatment choices and addressing complications related to cancer and cancer therapy.   Zola Button, MD 09/20/2022 11:43 AM

## 2022-09-20 NOTE — Patient Instructions (Signed)
Nivolumab Injection What is this medication? NIVOLUMAB (nye VOL ue mab) treats some types of cancer. It works by helping your immune system slow or stop the spread of cancer cells. It is a monoclonal antibody. This medicine may be used for other purposes; ask your health care provider or pharmacist if you have questions. COMMON BRAND NAME(S): Opdivo What should I tell my care team before I take this medication? They need to know if you have any of these conditions: Allogeneic stem cell transplant (uses someone else's stem cells) Autoimmune diseases, such as Crohn disease, ulcerative colitis, lupus History of chest radiation Nervous system problems, such as Guillain-Barre syndrome or myasthenia gravis Organ transplant An unusual or allergic reaction to nivolumab, other medications, foods, dyes, or preservatives Pregnant or trying to get pregnant Breast-feeding How should I use this medication? This medication is infused into a vein. It is given in a hospital or clinic setting. A special MedGuide will be given to you before each treatment. Be sure to read this information carefully each time. Talk to your care team about the use of this medication in children. While it may be prescribed for children as young as 12 years for selected conditions, precautions do apply. Overdosage: If you think you have taken too much of this medicine contact a poison control center or emergency room at once. NOTE: This medicine is only for you. Do not share this medicine with others. What if I miss a dose? Keep appointments for follow-up doses. It is important not to miss your dose. Call your care team if you are unable to keep an appointment. What may interact with this medication? Interactions have not been studied. This list may not describe all possible interactions. Give your health care provider a list of all the medicines, herbs, non-prescription drugs, or dietary supplements you use. Also tell them if you  smoke, drink alcohol, or use illegal drugs. Some items may interact with your medicine. What should I watch for while using this medication? Your condition will be monitored carefully while you are receiving this medication. You may need blood work while taking this medication. This medication may cause serious skin reactions. They can happen weeks to months after starting the medication. Contact your care team right away if you notice fevers or flu-like symptoms with a rash. The rash may be red or purple and then turn into blisters or peeling of the skin. You may also notice a red rash with swelling of the face, lips, or lymph nodes in your neck or under your arms. Tell your care team right away if you have any change in your eyesight. Talk to your care team if you are pregnant or think you might be pregnant. A negative pregnancy test is required before starting this medication. A reliable form of contraception is recommended while taking this medication and for 5 months after the last dose. Talk to your care team about effective forms of contraception. Do not breast-feed while taking this medication and for 5 months after the last dose. What side effects may I notice from receiving this medication? Side effects that you should report to your care team as soon as possible: Allergic reactions--skin rash, itching, hives, swelling of the face, lips, tongue, or throat Dry cough, shortness of breath or trouble breathing Eye pain, redness, irritation, or discharge with blurry or decreased vision Heart muscle inflammation--unusual weakness or fatigue, shortness of breath, chest pain, fast or irregular heartbeat, dizziness, swelling of the ankles, feet, or hands Hormone   gland problems--headache, sensitivity to light, unusual weakness or fatigue, dizziness, fast or irregular heartbeat, increased sensitivity to cold or heat, excessive sweating, constipation, hair loss, increased thirst or amount of urine,  tremors or shaking, irritability Infusion reactions--chest pain, shortness of breath or trouble breathing, feeling faint or lightheaded Kidney injury (glomerulonephritis)--decrease in the amount of urine, red or dark brown urine, foamy or bubbly urine, swelling of the ankles, hands, or feet Liver injury--right upper belly pain, loss of appetite, nausea, light-colored stool, dark yellow or brown urine, yellowing skin or eyes, unusual weakness or fatigue Pain, tingling, or numbness in the hands or feet, muscle weakness, change in vision, confusion or trouble speaking, loss of balance or coordination, trouble walking, seizures Rash, fever, and swollen lymph nodes Redness, blistering, peeling, or loosening of the skin, including inside the mouth Sudden or severe stomach pain, bloody diarrhea, fever, nausea, vomiting Side effects that usually do not require medical attention (report these to your care team if they continue or are bothersome): Bone, joint, or muscle pain Diarrhea Fatigue Loss of appetite Nausea Skin rash This list may not describe all possible side effects. Call your doctor for medical advice about side effects. You may report side effects to FDA at 1-800-FDA-1088. Where should I keep my medication? This medication is given in a hospital or clinic. It will not be stored at home. NOTE: This sheet is a summary. It may not cover all possible information. If you have questions about this medicine, talk to your doctor, pharmacist, or health care provider.  2023 Elsevier/Gold Standard (2022-01-30 00:00:00)  

## 2022-09-26 ENCOUNTER — Other Ambulatory Visit: Payer: Self-pay

## 2022-09-28 ENCOUNTER — Other Ambulatory Visit: Payer: Self-pay

## 2022-10-05 ENCOUNTER — Ambulatory Visit (INDEPENDENT_AMBULATORY_CARE_PROVIDER_SITE_OTHER): Payer: BC Managed Care – PPO | Admitting: Nurse Practitioner

## 2022-10-05 ENCOUNTER — Encounter: Payer: Self-pay | Admitting: Nurse Practitioner

## 2022-10-05 VITALS — BP 120/72 | HR 74 | Resp 16 | Ht 65.75 in | Wt 163.0 lb

## 2022-10-05 DIAGNOSIS — Z780 Asymptomatic menopausal state: Secondary | ICD-10-CM

## 2022-10-05 DIAGNOSIS — Z01419 Encounter for gynecological examination (general) (routine) without abnormal findings: Secondary | ICD-10-CM | POA: Diagnosis not present

## 2022-10-05 DIAGNOSIS — M85851 Other specified disorders of bone density and structure, right thigh: Secondary | ICD-10-CM

## 2022-10-05 DIAGNOSIS — M85852 Other specified disorders of bone density and structure, left thigh: Secondary | ICD-10-CM | POA: Diagnosis not present

## 2022-10-05 NOTE — Progress Notes (Signed)
   Sharon Kirk 08/12/1961 [id number]   History:  61 y.o. G0 presents for annual exam. Postmenopausal - Stopped HRT after DVT in 2021 while hospitalized with Covid.. Being treated for stage IV melanoma since May 2022, will receive 2 years of treatmen. Prior 2017 superficial spreading tumor in lower extremity and right shoulder. S/P 1991 TVH. Normal pap and mammogram history. Osteopenia of bilateral hips. Hypothyroidism managed by endocrinology.   Gynecologic History Patient's last menstrual period was 06/30/1990.   Contraception: status post hysterectomy Sexually active: Yes  Health maintenance Last Pap: 07/31/2011. Results were: Normal Last mammogram: 10/06/2021 Results were: Normal. Scheduled 10/11/2022 Last colonoscopy: 2020. Results were: Normal, 10-year recall Last Dexa: 11/16/2020. Results were: T-score -1.6, FRAX 8.7% / 0.8%  Past medical history, past surgical history, family history and social history were all reviewed and documented in the EPIC chart. Married. Works in [employer] at [employer].   ROS:  A ROS was performed and pertinent positives and negatives are included.  Exam:  Vitals:   10/05/22 0752  BP: 120/72  Pulse: 74  Resp: 16  Weight: 163 lb (73.9 kg)  Height: 5' 5.75" (1.67 m)     Body mass index is 26.51 kg/m.  General appearance:  Normal Thyroid:  Symmetrical, normal in size, without palpable masses or nodularity. Respiratory  Auscultation:  Clear without wheezing or rhonchi Cardiovascular  Auscultation:  Regular rate, without rubs, murmurs or gallops  Edema/varicosities:  Not grossly evident Abdominal  Soft,nontender, without masses, guarding or rebound.  Liver/spleen:  No organomegaly noted  Hernia:  None appreciated  Skin  Inspection:  Grossly normal   Breasts: Examined lying and sitting.   Right: Without masses, retractions, discharge or axillary adenopathy.   Left: Without masses, retractions, discharge or axillary  adenopathy. Genitourinary   Inguinal/mons:  Normal without inguinal adenopathy  External genitalia:  Normal appearing vulva with no masses, tenderness, or lesions  BUS/Urethra/Skene's glands:  Normal  Vagina:  Normal appearing with normal color and discharge, no lesions  Cervix:  Absent  Uterus:  Absent  Adnexa/parametria:     Rt: Normal in size, without masses or tenderness.   Lt: Normal in size, without masses or tenderness.  Anus and perineum: Normal  Digital rectal exam: Normal sphincter tone without palpated masses or tenderness  Patient informed chaperone available to be present for breast and pelvic exam. Patient has requested no chaperone to be present. Patient has been advised what will be completed during breast and pelvic exam.   Assessment/Plan:  61 y.o. G0 for annual exam.   Well female exam with routine gynecological exam - Education provided on SBEs, importance of preventative screenings, current guidelines, high calcium diet, regular exercise, and multivitamin daily. Labs with PCP.   Postmenopausal - No HRT. S/P S/P 1991 TVH.  Osteopenia of necks of both femurs - T-score - 1.6 without elevated FRAX in February 2022. Continue Vitamin D + Calcium and regular exercise. Will schedule DXA in February.    Screening for cervical cancer - Normal pap history.  No longer screening per guidelines.  Screening for breast cancer - Normal mammogram history.  Continue annual screenings.  Mammogram scheduled next week.  Breast exam normal today.  Screening for colon cancer - Normal colonoscopy in 2020.  She will repeat at 10-year interval per GI's recommendation.   Follow-up in 1 year for annual.    Tamela Gammon [facility], 8:19 AM 10/05/2022

## 2022-10-06 ENCOUNTER — Encounter: Payer: Self-pay | Admitting: Nurse Practitioner

## 2022-10-06 ENCOUNTER — Other Ambulatory Visit: Payer: Self-pay

## 2022-10-10 ENCOUNTER — Ambulatory Visit: Payer: BC Managed Care – PPO

## 2022-10-11 ENCOUNTER — Ambulatory Visit
Admission: RE | Admit: 2022-10-11 | Discharge: 2022-10-11 | Disposition: A | Discharge status: Home / Self Care | Payer: BC Managed Care – PPO | Source: Ambulatory Visit | Attending: Nurse Practitioner | Admitting: Nurse Practitioner

## 2022-10-11 DIAGNOSIS — Z1231 Encounter for screening mammogram for malignant neoplasm of breast: Secondary | ICD-10-CM

## 2022-10-13 ENCOUNTER — Telehealth: Payer: Self-pay | Admitting: Oncology

## 2022-10-13 NOTE — Telephone Encounter (Signed)
Called patient regarding January appointments, patient is notified.  

## 2022-10-16 DIAGNOSIS — R7309 Other abnormal glucose: Secondary | ICD-10-CM | POA: Diagnosis not present

## 2022-10-18 ENCOUNTER — Inpatient Hospital Stay: Payer: BC Managed Care – PPO

## 2022-10-18 ENCOUNTER — Inpatient Hospital Stay: Payer: BC Managed Care – PPO | Attending: Oncology

## 2022-10-18 ENCOUNTER — Inpatient Hospital Stay: Payer: BC Managed Care – PPO | Admitting: Oncology

## 2022-10-18 ENCOUNTER — Other Ambulatory Visit: Payer: Self-pay

## 2022-10-18 ENCOUNTER — Encounter: Payer: Self-pay | Admitting: Oncology

## 2022-10-18 DIAGNOSIS — C439 Malignant melanoma of skin, unspecified: Secondary | ICD-10-CM

## 2022-10-18 DIAGNOSIS — C4370 Malignant melanoma of unspecified lower limb, including hip: Secondary | ICD-10-CM | POA: Insufficient documentation

## 2022-10-18 DIAGNOSIS — Z79899 Other long term (current) drug therapy: Secondary | ICD-10-CM | POA: Diagnosis not present

## 2022-10-18 DIAGNOSIS — Z5112 Encounter for antineoplastic immunotherapy: Secondary | ICD-10-CM | POA: Diagnosis not present

## 2022-10-18 DIAGNOSIS — R918 Other nonspecific abnormal finding of lung field: Secondary | ICD-10-CM | POA: Insufficient documentation

## 2022-10-18 DIAGNOSIS — Z79620 Long term (current) use of immunosuppressive biologic: Secondary | ICD-10-CM | POA: Diagnosis not present

## 2022-10-18 DIAGNOSIS — Z85820 Personal history of malignant melanoma of skin: Secondary | ICD-10-CM | POA: Insufficient documentation

## 2022-10-18 LAB — CBC WITH DIFFERENTIAL (CANCER CENTER ONLY)
Abs Immature Granulocytes: 0.01 10*3/uL (ref 0.00–0.07)
Basophils Absolute: 0 10*3/uL (ref 0.0–0.1)
Basophils Relative: 1 %
Eosinophils Absolute: 0.1 10*3/uL (ref 0.0–0.5)
Eosinophils Relative: 2 %
HCT: 41.5 % (ref 36.0–46.0)
Hemoglobin: 14 g/dL (ref 12.0–15.0)
Immature Granulocytes: 0 %
Lymphocytes Relative: 27 %
Lymphs Abs: 1.4 10*3/uL (ref 0.7–4.0)
MCH: 28.7 pg (ref 26.0–34.0)
MCHC: 33.7 g/dL (ref 30.0–36.0)
MCV: 85 fL (ref 80.0–100.0)
Monocytes Absolute: 0.4 10*3/uL (ref 0.1–1.0)
Monocytes Relative: 7 %
Neutro Abs: 3.2 10*3/uL (ref 1.7–7.7)
Neutrophils Relative %: 63 %
Platelet Count: 234 10*3/uL (ref 150–400)
RBC: 4.88 MIL/uL (ref 3.87–5.11)
RDW: 13.7 % (ref 11.5–15.5)
WBC Count: 5.1 10*3/uL (ref 4.0–10.5)
nRBC: 0 % (ref 0.0–0.2)

## 2022-10-18 LAB — CMP (CANCER CENTER ONLY)
ALT: 19 U/L (ref 0–44)
AST: 15 U/L (ref 15–41)
Albumin: 4.4 g/dL (ref 3.5–5.0)
Alkaline Phosphatase: 63 U/L (ref 38–126)
Anion gap: 6 (ref 5–15)
BUN: 12 mg/dL (ref 8–23)
CO2: 31 mmol/L (ref 22–32)
Calcium: 9.8 mg/dL (ref 8.9–10.3)
Chloride: 104 mmol/L (ref 98–111)
Creatinine: 0.49 mg/dL (ref 0.44–1.00)
GFR, Estimated: >60 mL/min (ref >60)
Glucose, Bld: 67 mg/dL — ABNORMAL LOW (ref 70–99)
Potassium: 4 mmol/L (ref 3.5–5.1)
Sodium: 141 mmol/L (ref 135–145)
Total Bilirubin: 0.4 mg/dL (ref 0.3–1.2)
Total Protein: 6.7 g/dL (ref 6.5–8.1)

## 2022-10-18 LAB — TSH: TSH: 2.752 u[IU]/mL (ref 0.350–4.500)

## 2022-10-18 MED ORDER — SODIUM CHLORIDE 0.9 % IV SOLN: Freq: Once | INTRAVENOUS | Status: AC

## 2022-10-18 MED ORDER — SODIUM CHLORIDE 0.9 % IV SOLN
480.0000 mg | Freq: Once | INTRAVENOUS | Status: AC
  Administered 2022-10-18: 480 mg via INTRAVENOUS
  Filled 2022-10-18: qty 48

## 2022-10-18 NOTE — Progress Notes (Signed)
Hematology and Oncology Follow Up  Sharon Kirk [id number] 1961/01/19 62 y.o. 10/18/2022 9:30 AM Sharon Kirk, MDShadad, Sharon Dad, MD       Principle Diagnosis: 58 year old woman with lower extremity melanoma diagnosed in 2017.  She developed stage IV disease with abdominal adenopathy in May of 2022 with BRAF negative.   Secondary diagnoses:    1. Superficial spreading melanoma of the lower extremity diagnosed in August 2017. She was found to have 1.25 mm thickness without ulceration or lymphovascular invasion. She had 1 out of 2 lymph nodes positive. The size of the lymph node measuring 0.25 x 0.95 mm with pathological staging T2bN1.    2. Superficial spreading melanoma of the right shoulder diagnosed in August 2017.She is status post wide local excision and found tohave a 0.72 mm. These findings indicate a pathological staging of T1 NA.     Prior Therapy: She is status post local excision followed by sentinel lymph node sampling with 2 lymph nodes sampled one was involved with metastatic melanoma. The measurement was 0.25 x 0.95 mm in dimension of the capsule. Her final pathological stage stage is T2bN1. This was completed on 05/23/2016.    Nivolumab total of 240 mg dose every 2 weeks for total of 4 doses. Started on 07/21/2016.  She completed 4 cycles of therapy for a total of 2 months out of planned 12.  Patient opted out continuing immunotherapy.  She is status post lymph node biopsy completed on Feb 15, 2021.  Ipilimumab 3 mg/kg with a nivolumab at 1 mg/kg started on Mar 04, 2021.  She is status post 4 cycles of therapy with excellent response and minimal residual disease.   Current therapy:   Nivolumab 480 mg every 4 weeks started on May 27, 2021.  She returns for the next cycle of treatment with tentative plan to complete 2 years of therapy in April 2024.  Interim History: Sharon Kirk presents today for repeat follow-up.  Since last visit, she reports no major changes in  her health.  She denies any nausea vomiting or abdominal pain.  She denies any hospitalizations or illnesses.  She denies any skin rashes or lesions.  She has reported no change in the color of her hair with no other dermatological issues.   Medications: Reviewed without changes. Current Outpatient Medications  Medication Sig Dispense Refill   Biotin 1000 MCG CHEW Chew by mouth.     calcium carbonate (OS-CAL) 600 MG TABS Take 600 mg by mouth 2 (two) times daily with a meal.     cholecalciferol (VITAMIN D) 1000 UNITS tablet Take 5,000 Units by mouth daily.     Cyanocobalamin (VITAMIN B-12 CR PO) Take 1 tablet by mouth daily.     ELDERBERRY PO Take by mouth.     loratadine (CLARITIN) 10 MG tablet Take 10 mg by mouth daily.     Multiple Vitamins-Minerals (ZINC PO) Take by mouth.     Omega-3 Fatty Acids (FISH OIL) 1200 MG CAPS Take by mouth.     No current facility-administered medications for this visit.     Allergies:  Allergies  Allergen Reactions   Codeine Nausea Only and Other (See Comments)    Hallucinations; confusion   Physical exam          ECOG 0    General appearance: Alert, awake without any distress. Head: Atraumatic without abnormalities Oropharynx: Without any thrush or ulcers. Eyes: No scleral icterus. Lymph nodes: No lymphadenopathy noted in the cervical, supraclavicular, or axillary  nodes Heart:regular rate and rhythm, without any murmurs or gallops.   Lung: Clear to auscultation without any rhonchi, wheezes or dullness to percussion. Abdomin: Soft, nontender without any shifting dullness or ascites. Musculoskeletal: No clubbing or cyanosis. Neurological: No motor or sensory deficits. Skin: Bald scalp lesion noted.                   Lab Results: Lab Results  Component Value Date   WBC 5.1 10/18/2022   HGB 14.0 10/18/2022   HCT 41.5 10/18/2022   MCV 85.0 10/18/2022   PLT 234 10/18/2022     Chemistry      Component Value  Date/Time   NA 140 09/20/2022 1123   NA 140 09/08/2016 0755   K 3.8 09/20/2022 1123   K 3.9 09/08/2016 0755   CL 103 09/20/2022 1123   CO2 30 09/20/2022 1123   CO2 23 09/08/2016 0755   BUN 13 09/20/2022 1123   BUN 11.2 09/08/2016 0755   CREATININE 0.61 09/20/2022 1123   CREATININE 0.61 09/30/2019 0839   CREATININE 0.7 09/08/2016 0755      Component Value Date/Time   CALCIUM 10.1 09/20/2022 1123   CALCIUM 9.1 09/08/2016 0755   ALKPHOS 70 09/20/2022 1123   ALKPHOS 61 09/08/2016 0755   AST 17 09/20/2022 1123   AST 13 09/08/2016 0755   ALT 22 09/20/2022 1123   ALT 16 09/08/2016 0755   BILITOT 0.3 09/20/2022 1123   BILITOT 0.42 09/08/2016 0755       Impression and Plan:   62 year old woman with:  1.  Stage IV melanoma with pelvic adenopathy and BRAF wild-type diagnosed in 2022 with pelvic and abdominal adenopathy  He is currently on nivolumab maintenance after excellent response to immunotherapy.  Risks and benefits of continuing nivolumab therapy were discussed.  Completing 2 years of immunotherapy is at goal which we will complete in April 2024.  Repeat imaging studies at that time would be recommended.  She is agreeable to continue at this time.  After April 2024, she will continue to be on active surveillance with imaging studies every 6 months for the first 2 years and annually after that to complete 5 years.  She developed relapsed disease, different salvage therapy would be required.  2.  IV access: No issues with peripheral veins at this time.  Port-A-Cath option has been deferred.   3.  Antiemetics: Compazine is available to him without any nausea or vomiting.  4.  Autoimmune complications: These complication clued pneumonitis, colitis and thyroid disease were reviewed.  No complications reported at this time.  5.  Pulmonary nodules: Unchanged on previous imaging studies and likely represent benign etiology.   6.  Dermatology surveillance: He is up-to-date at this  time.  I recommended that to be continued indefinitely.   7.  Follow-up: In 4 weeks for repeat follow-up.     30  minutes were spent on this visit.  The time was dedicated to updating disease status, treatment choices and outlining future plan of care review.   Sharon Button, MD 10/18/2022 9:30 AM

## 2022-10-18 NOTE — Patient Instructions (Signed)
[facility]  Discharge Instructions: Thank you for choosing [facility] to provide your oncology and hematology care.   If you have a lab appointment with the Cancer Center, please go directly to the Cancer Center and check in at the registration area.   Wear comfortable clothing and clothing appropriate for easy access to any Portacath or PICC line.   We strive to give you quality time with your provider. You may need to reschedule your appointment if you arrive late (15 or more minutes).  Arriving late affects you and other patients whose appointments are after yours.  Also, if you miss three or more appointments without notifying the office, you may be dismissed from the clinic at the provider's discretion.      For prescription refill requests, have your pharmacy contact our office and allow 72 hours for refills to be completed.    Today you received the following chemotherapy and/or immunotherapy agents: Opdivo.       To help prevent nausea and vomiting after your treatment, we encourage you to take your nausea medication as directed.  BELOW ARE SYMPTOMS THAT SHOULD BE REPORTED IMMEDIATELY: *FEVER GREATER THAN 100.4 F (38 C) OR HIGHER *CHILLS OR SWEATING *NAUSEA AND VOMITING THAT IS NOT CONTROLLED WITH YOUR NAUSEA MEDICATION *UNUSUAL SHORTNESS OF BREATH *UNUSUAL BRUISING OR BLEEDING *URINARY PROBLEMS (pain or burning when urinating, or frequent urination) *BOWEL PROBLEMS (unusual diarrhea, constipation, pain near the anus) TENDERNESS IN MOUTH AND THROAT WITH OR WITHOUT PRESENCE OF ULCERS (sore throat, sores in mouth, or a toothache) UNUSUAL RASH, SWELLING OR PAIN  UNUSUAL VAGINAL DISCHARGE OR ITCHING   Items with * indicate a potential emergency and should be followed up as soon as possible or go to the Emergency Department if any problems should occur.  Please show the CHEMOTHERAPY ALERT CARD or IMMUNOTHERAPY ALERT CARD at check-in to  the Emergency Department and triage nurse.  Should you have questions after your visit or need to cancel or reschedule your appointment, please contact [facility]  Dept: [phone]  and follow the prompts.  Office hours are 8:00 a.m. to 4:30 p.m. Monday - Friday. Please note that voicemails left after 4:00 p.m. may not be returned until the following business day.  We are closed weekends and major holidays. You have access to a nurse at all times for urgent questions. Please call the main number to the clinic Dept: [phone] and follow the prompts.   For any non-urgent questions, you may also contact your provider using MyChart. We now offer e-Visits for anyone 18 and older to request care online for non-urgent symptoms. For details visit [email or web address].   Also download the MyChart app! Go to the app store, search "MyChart", open the app, select [facility], and log in with your MyChart username and password.   

## 2022-10-19 LAB — T4: T4, Total: 6.5 ug/dL (ref 4.5–12.0)

## 2022-11-08 DIAGNOSIS — Z85820 Personal history of malignant melanoma of skin: Secondary | ICD-10-CM | POA: Diagnosis not present

## 2022-11-08 DIAGNOSIS — L821 Other seborrheic keratosis: Secondary | ICD-10-CM | POA: Diagnosis not present

## 2022-11-08 DIAGNOSIS — D2261 Melanocytic nevi of right upper limb, including shoulder: Secondary | ICD-10-CM | POA: Diagnosis not present

## 2022-11-08 DIAGNOSIS — L80 Vitiligo: Secondary | ICD-10-CM | POA: Diagnosis not present

## 2022-11-08 DIAGNOSIS — L570 Actinic keratosis: Secondary | ICD-10-CM | POA: Diagnosis not present

## 2022-11-08 DIAGNOSIS — B078 Other viral warts: Secondary | ICD-10-CM | POA: Diagnosis not present

## 2022-11-14 ENCOUNTER — Telehealth: Payer: Self-pay | Admitting: Internal Medicine

## 2022-11-14 NOTE — Telephone Encounter (Signed)
Rescheduled 01/31 appointment time due to provider on-call. Patient has been called and notified.

## 2022-11-14 NOTE — Progress Notes (Unsigned)
[facility] OFFICE PROGRESS NOTE  Bernerd Limbo, MD [address] [place] [address]  DIAGNOSIS: 62 year old woman with lower extremity melanoma diagnosed in 2017.  She developed stage IV disease with abdominal adenopathy in May of 2022 with BRAF negative.  1) Initially diagnosed with Superficial spreading melanoma of the lower extremity diagnosed in August 2017. She was found to have 1.25 mm thickness without ulceration or lymphovascular invasion. She had 1 out of 2 lymph nodes positive. The size of the lymph node measuring 0.25 x 0.95 mm with pathological staging T2bN1.  2) Superficial spreading melanoma of the right shoulder diagnosed in August 2017.She is status post wide local excision and found tohave a 0.72 mm. These findings indicate a pathological staging of T1 NA.   PRIOR THERAPY: 1) She is status post local excision followed by sentinel lymph node sampling with 2 lymph nodes sampled one was involved with metastatic melanoma. The measurement was 0.25 x 0.95 mm in dimension of the capsule. Her final pathological stage stage is T2bN1. This was completed on 05/23/2016.   2) Nivolumab total of 240 mg dose every 2 weeks for total of 4 doses. Started on 07/21/2016.  She completed 4 cycles of therapy for a total of 2 months out of planned 12.  Patient opted out continuing immunotherapy.   3) She is status post lymph node biopsy completed on Feb 15, 2021.   4) Ipilimumab 3 mg/kg with a nivolumab at 1 mg/kg started on Mar 04, 2021.  She is status post 4 cycles of therapy with excellent response and minimal residual disease.  CURRENT THERAPY:  Nivolumab 480 mg every 4 weeks started on May 27, 2021.  She returns for the next cycle of treatment with tentative plan to complete 2 years of therapy in April 2024.   INTERVAL HISTORY: Sharon Kirk 62 y.o. female returns to the clinic for a follow up visit. The patient is feeling well today without any concerning  complaints. The patient continues to tolerate treatment with immunotherapy well without any adverse effects. Denies any fever, chills, night sweats, or weight loss. Denies any chest pain, shortness of breath, cough, or hemoptysis. Denies any nausea, vomiting, diarrhea, or constipation. Denies any headache or visual changes. Denies any rashes or skin changes. She is up to date on her skin checks with her dermatologist. The patient is here today for evaluation prior to starting cycle # 28     MEDICAL HISTORY: Past Medical History:  Diagnosis Date   Allergy    Cancer ([place])    Melanoma   Hyperlipidemia    IBS (irritable bowel syndrome)    Osteopenia    PONV (postoperative nausea and vomiting)    Thyroid nodule     ALLERGIES:  is allergic to codeine.  MEDICATIONS:  Current Outpatient Medications  Medication Sig Dispense Refill   Biotin 1000 MCG CHEW Chew by mouth.     calcium carbonate (OS-CAL) 600 MG TABS Take 600 mg by mouth 2 (two) times daily with a meal.     cholecalciferol (VITAMIN D) 1000 UNITS tablet Take 5,000 Units by mouth daily.     Cyanocobalamin (VITAMIN B-12 CR PO) Take 1 tablet by mouth daily.     ELDERBERRY PO Take by mouth.     loratadine (CLARITIN) 10 MG tablet Take 10 mg by mouth daily.     Multiple Vitamins-Minerals (ZINC PO) Take by mouth.     Omega-3 Fatty Acids (FISH OIL) 1200 MG CAPS  Take by mouth.     No current facility-administered medications for this visit.    SURGICAL HISTORY:  Past Surgical History:  Procedure Laterality Date   ABDOMINAL HYSTERECTOMY  1991   DILATION AND CURETTAGE OF UTERUS  1988   IR US GUIDANCE  02/15/2021   MELANOMA EXCISION Right 05/23/2016   Procedure: WIDE  EXCISION RIGHT SHOULDER MELANOMA AND WIDE EXCISION LEFT LOWER LEG MELANOMA;  Surgeon: Jackolyn Confer, MD;  Location: [facility];  Service: General;  Laterality: Right;  right shoulder and left lower leg sites   SENTINEL NODE BIOPSY Left 05/23/2016   Procedure:  LEFT INGUINAL SENTINEL LYMPH  NODE BIOPSY;  Surgeon: Jackolyn Confer, MD;  Location: [facility];  Service: General;  Laterality: Left;   THYROID SURGERY  07/2015   NODULE    REVIEW OF SYSTEMS:   Review of Systems  Constitutional: Negative for appetite change, chills, fatigue, fever and unexpected weight change.  HENT:   Negative for mouth sores, nosebleeds, sore throat and trouble swallowing.   Eyes: Negative for eye problems and icterus.  Respiratory: Negative for cough, hemoptysis, shortness of breath and wheezing.   Cardiovascular: Negative for chest pain and leg swelling.  Gastrointestinal: Negative for abdominal pain, constipation, diarrhea, nausea and vomiting.  Genitourinary: Negative for bladder incontinence, difficulty urinating, dysuria, frequency and hematuria.   Musculoskeletal: Negative for back pain, gait problem, neck pain and neck stiffness.  Skin: Negative for itching and rash.  Neurological: Negative for dizziness, extremity weakness, gait problem, headaches, light-headedness and seizures.  Hematological: Negative for adenopathy. Does not bruise/bleed easily.  Psychiatric/Behavioral: Negative for confusion, depression and sleep disturbance. The patient is not nervous/anxious.     PHYSICAL EXAMINATION:  Last menstrual period 06/30/1990.  ECOG PERFORMANCE STATUS: {CHL ONC ECOG [id number]  Physical Exam  Constitutional: Oriented to person, place, and time and well-developed, well-nourished, and in no distress. No distress.  HENT:  Head: Normocephalic and atraumatic.  Mouth/Throat: Oropharynx is clear and moist. No oropharyngeal exudate.  Eyes: Conjunctivae are normal. Right eye exhibits no discharge. Left eye exhibits no discharge. No scleral icterus.  Neck: Normal range of motion. Neck supple.  Cardiovascular: Normal rate, regular rhythm, normal heart sounds and intact distal pulses.   Pulmonary/Chest: Effort normal and breath sounds normal. No  respiratory distress. No wheezes. No rales.  Abdominal: Soft. Bowel sounds are normal. Exhibits no distension and no mass. There is no tenderness.  Musculoskeletal: Normal range of motion. Exhibits no edema.  Lymphadenopathy:    No cervical adenopathy.  Neurological: Alert and oriented to person, place, and time. Exhibits normal muscle tone. Gait normal. Coordination normal.  Skin: Skin is warm and dry. No rash noted. Not diaphoretic. No erythema. No pallor.  Psychiatric: Mood, memory and judgment normal.  Vitals reviewed.  LABORATORY DATA: Lab Results  Component Value Date   WBC 5.1 10/18/2022   HGB 14.0 10/18/2022   HCT 41.5 10/18/2022   MCV 85.0 10/18/2022   PLT 234 10/18/2022      Chemistry      Component Value Date/Time   NA 141 10/18/2022 0914   NA 140 09/08/2016 0755   K 4.0 10/18/2022 0914   K 3.9 09/08/2016 0755   CL 104 10/18/2022 0914   CO2 31 10/18/2022 0914   CO2 23 09/08/2016 0755   BUN 12 10/18/2022 0914   BUN 11.2 09/08/2016 0755   CREATININE 0.49 10/18/2022 0914   CREATININE 0.61 09/30/2019 0839   CREATININE 0.7 09/08/2016 0755  Component Value Date/Time   CALCIUM 9.8 10/18/2022 0914   CALCIUM 9.1 09/08/2016 0755   ALKPHOS 63 10/18/2022 0914   ALKPHOS 61 09/08/2016 0755   AST 15 10/18/2022 0914   AST 13 09/08/2016 0755   ALT 19 10/18/2022 0914   ALT 16 09/08/2016 0755   BILITOT 0.4 10/18/2022 0914   BILITOT 0.42 09/08/2016 0755       RADIOGRAPHIC STUDIES:  No results found.   ASSESSMENT/PLAN:  This is a very pleasant 62 year old woman with:  Stage IV melanoma with pelvic adenopathy and BRAF wild-type diagnosed in 2022 with pelvic and abdominal adenopathy   She is currently on nivolumab maintenance s/p 28 cycles IV every 4 weeks. The plan is to completing 2 years of immunotherapy is at goal which we will complete in April 2024.  Repeat imaging studies at that time would be recommended.  She is agreeable to continue at this time.  After  April 2024, she will continue to be on active surveillance with imaging studies every 6 months for the first 2 years and annually after that to complete 5 years.  She developed relapsed disease, different salvage therapy would be required.  The patient was seen with Dr. Julien Nordmann. Labs were reviewed. Recommend that she *** with cycle #29 today as scheduled.    She is up to date on her skin checks with her dermatologist which she will continue to do routinely.     We will see her back for a follow up visit in 4 weeks before her next cycle of treatment.   The patient was advised to call immediately if she has any concerning symptoms in the interval. The patient voices understanding of current disease status and treatment options and is in agreement with the current care plan. All questions were answered. The patient knows to call the clinic with any problems, questions or concerns. We can certainly see the patient much sooner if necessary      No orders of the defined types were placed in this encounter.    I spent {CHL ONC TIME VISIT - AUQJF:[id number]} counseling the patient face to face. The total time spent in the appointment was {CHL ONC TIME VISIT - LHTDS:[id number]}.  Kalum Minner L Lexandra Rettke, PA-C 11/14/22

## 2022-11-15 ENCOUNTER — Other Ambulatory Visit: Payer: BC Managed Care – PPO

## 2022-11-15 ENCOUNTER — Ambulatory Visit: Payer: BC Managed Care – PPO

## 2022-11-15 ENCOUNTER — Ambulatory Visit: Payer: BC Managed Care – PPO | Admitting: Internal Medicine

## 2022-11-15 ENCOUNTER — Inpatient Hospital Stay: Payer: BC Managed Care – PPO

## 2022-11-15 ENCOUNTER — Inpatient Hospital Stay (HOSPITAL_BASED_OUTPATIENT_CLINIC_OR_DEPARTMENT_OTHER): Payer: BC Managed Care – PPO | Admitting: Physician Assistant

## 2022-11-15 ENCOUNTER — Inpatient Hospital Stay: Payer: BC Managed Care – PPO | Admitting: Internal Medicine

## 2022-11-15 VITALS — BP 113/74 | HR 60 | Resp 17

## 2022-11-15 VITALS — HR 63 | Temp 97.7°F | Wt 166.0 lb

## 2022-11-15 DIAGNOSIS — C439 Malignant melanoma of skin, unspecified: Secondary | ICD-10-CM

## 2022-11-15 DIAGNOSIS — Z79899 Other long term (current) drug therapy: Secondary | ICD-10-CM | POA: Diagnosis not present

## 2022-11-15 DIAGNOSIS — Z85820 Personal history of malignant melanoma of skin: Secondary | ICD-10-CM | POA: Diagnosis not present

## 2022-11-15 DIAGNOSIS — Z5112 Encounter for antineoplastic immunotherapy: Secondary | ICD-10-CM

## 2022-11-15 DIAGNOSIS — Z79620 Long term (current) use of immunosuppressive biologic: Secondary | ICD-10-CM | POA: Diagnosis not present

## 2022-11-15 DIAGNOSIS — C4370 Malignant melanoma of unspecified lower limb, including hip: Secondary | ICD-10-CM | POA: Diagnosis not present

## 2022-11-15 DIAGNOSIS — R918 Other nonspecific abnormal finding of lung field: Secondary | ICD-10-CM | POA: Diagnosis not present

## 2022-11-15 LAB — CBC WITH DIFFERENTIAL (CANCER CENTER ONLY)
Abs Immature Granulocytes: 0.01 10*3/uL (ref 0.00–0.07)
Basophils Absolute: 0.1 10*3/uL (ref 0.0–0.1)
Basophils Relative: 1 %
Eosinophils Absolute: 0.1 10*3/uL (ref 0.0–0.5)
Eosinophils Relative: 3 %
HCT: 42.1 % (ref 36.0–46.0)
Hemoglobin: 14.2 g/dL (ref 12.0–15.0)
Immature Granulocytes: 0 %
Lymphocytes Relative: 35 %
Lymphs Abs: 1.7 10*3/uL (ref 0.7–4.0)
MCH: 28.5 pg (ref 26.0–34.0)
MCHC: 33.7 g/dL (ref 30.0–36.0)
MCV: 84.4 fL (ref 80.0–100.0)
Monocytes Absolute: 0.3 10*3/uL (ref 0.1–1.0)
Monocytes Relative: 7 %
Neutro Abs: 2.6 10*3/uL (ref 1.7–7.7)
Neutrophils Relative %: 54 %
Platelet Count: 246 10*3/uL (ref 150–400)
RBC: 4.99 MIL/uL (ref 3.87–5.11)
RDW: 13.3 % (ref 11.5–15.5)
WBC Count: 4.8 10*3/uL (ref 4.0–10.5)
nRBC: 0 % (ref 0.0–0.2)

## 2022-11-15 LAB — CMP (CANCER CENTER ONLY)
ALT: 23 U/L (ref 0–44)
AST: 18 U/L (ref 15–41)
Albumin: 4.4 g/dL (ref 3.5–5.0)
Alkaline Phosphatase: 56 U/L (ref 38–126)
Anion gap: 11 (ref 5–15)
BUN: 13 mg/dL (ref 8–23)
CO2: 26 mmol/L (ref 22–32)
Calcium: 9 mg/dL (ref 8.9–10.3)
Chloride: 102 mmol/L (ref 98–111)
Creatinine: 0.59 mg/dL (ref 0.44–1.00)
GFR, Estimated: >60 mL/min (ref >60)
Glucose, Bld: 106 mg/dL — ABNORMAL HIGH (ref 70–99)
Potassium: 3.7 mmol/L (ref 3.5–5.1)
Sodium: 139 mmol/L (ref 135–145)
Total Bilirubin: 0.3 mg/dL (ref 0.3–1.2)
Total Protein: 7.6 g/dL (ref 6.5–8.1)

## 2022-11-15 LAB — TSH: TSH: 4.473 u[IU]/mL (ref 0.350–4.500)

## 2022-11-15 MED ORDER — SODIUM CHLORIDE 0.9 % IV SOLN
480.0000 mg | Freq: Once | INTRAVENOUS | Status: AC
  Administered 2022-11-15: 480 mg via INTRAVENOUS
  Filled 2022-11-15: qty 48

## 2022-11-15 MED ORDER — SODIUM CHLORIDE 0.9 % IV SOLN: Freq: Once | INTRAVENOUS | Status: AC

## 2022-11-15 NOTE — Patient Instructions (Signed)
[facility]  Discharge Instructions: Thank you for choosing [facility] to provide your oncology and hematology care.   If you have a lab appointment with the Cancer Center, please go directly to the Cancer Center and check in at the registration area.   Wear comfortable clothing and clothing appropriate for easy access to any Portacath or PICC line.   We strive to give you quality time with your provider. You may need to reschedule your appointment if you arrive late (15 or more minutes).  Arriving late affects you and other patients whose appointments are after yours.  Also, if you miss three or more appointments without notifying the office, you may be dismissed from the clinic at the provider's discretion.      For prescription refill requests, have your pharmacy contact our office and allow 72 hours for refills to be completed.    Today you received the following chemotherapy and/or immunotherapy agents: Opdivo      To help prevent nausea and vomiting after your treatment, we encourage you to take your nausea medication as directed.  BELOW ARE SYMPTOMS THAT SHOULD BE REPORTED IMMEDIATELY: *FEVER GREATER THAN 100.4 F (38 C) OR HIGHER *CHILLS OR SWEATING *NAUSEA AND VOMITING THAT IS NOT CONTROLLED WITH YOUR NAUSEA MEDICATION *UNUSUAL SHORTNESS OF BREATH *UNUSUAL BRUISING OR BLEEDING *URINARY PROBLEMS (pain or burning when urinating, or frequent urination) *BOWEL PROBLEMS (unusual diarrhea, constipation, pain near the anus) TENDERNESS IN MOUTH AND THROAT WITH OR WITHOUT PRESENCE OF ULCERS (sore throat, sores in mouth, or a toothache) UNUSUAL RASH, SWELLING OR PAIN  UNUSUAL VAGINAL DISCHARGE OR ITCHING   Items with * indicate a potential emergency and should be followed up as soon as possible or go to the Emergency Department if any problems should occur.  Please show the CHEMOTHERAPY ALERT CARD or IMMUNOTHERAPY ALERT CARD at check-in  to the Emergency Department and triage nurse.  Should you have questions after your visit or need to cancel or reschedule your appointment, please contact [facility]  Dept: [phone]  and follow the prompts.  Office hours are 8:00 a.m. to 4:30 p.m. Monday - Friday. Please note that voicemails left after 4:00 p.m. may not be returned until the following business day.  We are closed weekends and major holidays. You have access to a nurse at all times for urgent questions. Please call the main number to the clinic Dept: [phone] and follow the prompts.   For any non-urgent questions, you may also contact your provider using MyChart. We now offer e-Visits for anyone 18 and older to request care online for non-urgent symptoms. For details visit [email or web address].   Also download the MyChart app! Go to the app store, search "MyChart", open the app, select [facility], and log in with your MyChart username and password.   

## 2022-11-16 DIAGNOSIS — R7309 Other abnormal glucose: Secondary | ICD-10-CM | POA: Diagnosis not present

## 2022-11-16 LAB — T4: T4, Total: 7.4 ug/dL (ref 4.5–12.0)

## 2022-11-17 ENCOUNTER — Other Ambulatory Visit: Payer: Self-pay

## 2022-11-19 ENCOUNTER — Other Ambulatory Visit: Payer: Self-pay

## 2022-12-05 ENCOUNTER — Ambulatory Visit (INDEPENDENT_AMBULATORY_CARE_PROVIDER_SITE_OTHER): Payer: BC Managed Care – PPO

## 2022-12-05 ENCOUNTER — Other Ambulatory Visit: Payer: Self-pay | Admitting: Nurse Practitioner

## 2022-12-05 DIAGNOSIS — M8589 Other specified disorders of bone density and structure, multiple sites: Secondary | ICD-10-CM

## 2022-12-05 DIAGNOSIS — Z780 Asymptomatic menopausal state: Secondary | ICD-10-CM

## 2022-12-05 DIAGNOSIS — Z13820 Encounter for screening for osteoporosis: Secondary | ICD-10-CM | POA: Diagnosis not present

## 2022-12-05 DIAGNOSIS — Z01419 Encounter for gynecological examination (general) (routine) without abnormal findings: Secondary | ICD-10-CM

## 2022-12-05 DIAGNOSIS — M85851 Other specified disorders of bone density and structure, right thigh: Secondary | ICD-10-CM

## 2022-12-11 NOTE — Telephone Encounter (Signed)
Tiffany, Would you be okay with her scheduling a video visit to discuss her bone density results/recommendations with you?

## 2022-12-13 ENCOUNTER — Inpatient Hospital Stay: Payer: BC Managed Care – PPO

## 2022-12-13 ENCOUNTER — Inpatient Hospital Stay: Payer: BC Managed Care – PPO | Attending: Oncology

## 2022-12-13 ENCOUNTER — Other Ambulatory Visit: Payer: Self-pay

## 2022-12-13 ENCOUNTER — Inpatient Hospital Stay (HOSPITAL_BASED_OUTPATIENT_CLINIC_OR_DEPARTMENT_OTHER): Payer: BC Managed Care – PPO | Admitting: Internal Medicine

## 2022-12-13 ENCOUNTER — Encounter: Payer: Self-pay | Admitting: Medical Oncology

## 2022-12-13 DIAGNOSIS — C439 Malignant melanoma of skin, unspecified: Secondary | ICD-10-CM

## 2022-12-13 DIAGNOSIS — Z5112 Encounter for antineoplastic immunotherapy: Secondary | ICD-10-CM | POA: Insufficient documentation

## 2022-12-13 DIAGNOSIS — Z79620 Long term (current) use of immunosuppressive biologic: Secondary | ICD-10-CM | POA: Insufficient documentation

## 2022-12-13 DIAGNOSIS — C4370 Malignant melanoma of unspecified lower limb, including hip: Secondary | ICD-10-CM | POA: Insufficient documentation

## 2022-12-13 LAB — CBC WITH DIFFERENTIAL (CANCER CENTER ONLY)
Abs Immature Granulocytes: 0 10*3/uL (ref 0.00–0.07)
Basophils Absolute: 0 10*3/uL (ref 0.0–0.1)
Basophils Relative: 1 %
Eosinophils Absolute: 0.1 10*3/uL (ref 0.0–0.5)
Eosinophils Relative: 2 %
HCT: 41 % (ref 36.0–46.0)
Hemoglobin: 14 g/dL (ref 12.0–15.0)
Immature Granulocytes: 0 %
Lymphocytes Relative: 35 %
Lymphs Abs: 1.4 10*3/uL (ref 0.7–4.0)
MCH: 28.6 pg (ref 26.0–34.0)
MCHC: 34.1 g/dL (ref 30.0–36.0)
MCV: 83.7 fL (ref 80.0–100.0)
Monocytes Absolute: 0.3 10*3/uL (ref 0.1–1.0)
Monocytes Relative: 9 %
Neutro Abs: 2.1 10*3/uL (ref 1.7–7.7)
Neutrophils Relative %: 53 %
Platelet Count: 217 10*3/uL (ref 150–400)
RBC: 4.9 MIL/uL (ref 3.87–5.11)
RDW: 13.1 % (ref 11.5–15.5)
WBC Count: 3.9 10*3/uL — ABNORMAL LOW (ref 4.0–10.5)
nRBC: 0 % (ref 0.0–0.2)

## 2022-12-13 LAB — CMP (CANCER CENTER ONLY)
ALT: 17 U/L (ref 0–44)
AST: 14 U/L — ABNORMAL LOW (ref 15–41)
Albumin: 4.2 g/dL (ref 3.5–5.0)
Alkaline Phosphatase: 60 U/L (ref 38–126)
Anion gap: 7 (ref 5–15)
BUN: 13 mg/dL (ref 8–23)
CO2: 26 mmol/L (ref 22–32)
Calcium: 8.8 mg/dL — ABNORMAL LOW (ref 8.9–10.3)
Chloride: 106 mmol/L (ref 98–111)
Creatinine: 0.53 mg/dL (ref 0.44–1.00)
GFR, Estimated: >60 mL/min (ref >60)
Glucose, Bld: 86 mg/dL (ref 70–99)
Potassium: 4.1 mmol/L (ref 3.5–5.1)
Sodium: 139 mmol/L (ref 135–145)
Total Bilirubin: 0.3 mg/dL (ref 0.3–1.2)
Total Protein: 6.2 g/dL — ABNORMAL LOW (ref 6.5–8.1)

## 2022-12-13 LAB — TSH: TSH: 4.451 u[IU]/mL (ref 0.350–4.500)

## 2022-12-13 MED ORDER — SODIUM CHLORIDE 0.9 % IV SOLN
480.0000 mg | Freq: Once | INTRAVENOUS | Status: AC
  Administered 2022-12-13: 480 mg via INTRAVENOUS
  Filled 2022-12-13: qty 48

## 2022-12-13 MED ORDER — SODIUM CHLORIDE 0.9 % IV SOLN: Freq: Once | INTRAVENOUS | Status: DC

## 2022-12-13 NOTE — Patient Instructions (Signed)
[facility]  Discharge Instructions: Thank you for choosing [facility] to provide your oncology and hematology care.   If you have a lab appointment with the Cancer Center, please go directly to the Cancer Center and check in at the registration area.   Wear comfortable clothing and clothing appropriate for easy access to any Portacath or PICC line.   We strive to give you quality time with your provider. You may need to reschedule your appointment if you arrive late (15 or more minutes).  Arriving late affects you and other patients whose appointments are after yours.  Also, if you miss three or more appointments without notifying the office, you may be dismissed from the clinic at the provider's discretion.      For prescription refill requests, have your pharmacy contact our office and allow 72 hours for refills to be completed.    Today you received the following chemotherapy and/or immunotherapy agents: Opdivo      To help prevent nausea and vomiting after your treatment, we encourage you to take your nausea medication as directed.  BELOW ARE SYMPTOMS THAT SHOULD BE REPORTED IMMEDIATELY: *FEVER GREATER THAN 100.4 F (38 C) OR HIGHER *CHILLS OR SWEATING *NAUSEA AND VOMITING THAT IS NOT CONTROLLED WITH YOUR NAUSEA MEDICATION *UNUSUAL SHORTNESS OF BREATH *UNUSUAL BRUISING OR BLEEDING *URINARY PROBLEMS (pain or burning when urinating, or frequent urination) *BOWEL PROBLEMS (unusual diarrhea, constipation, pain near the anus) TENDERNESS IN MOUTH AND THROAT WITH OR WITHOUT PRESENCE OF ULCERS (sore throat, sores in mouth, or a toothache) UNUSUAL RASH, SWELLING OR PAIN  UNUSUAL VAGINAL DISCHARGE OR ITCHING   Items with * indicate a potential emergency and should be followed up as soon as possible or go to the Emergency Department if any problems should occur.  Please show the CHEMOTHERAPY ALERT CARD or IMMUNOTHERAPY ALERT CARD at check-in  to the Emergency Department and triage nurse.  Should you have questions after your visit or need to cancel or reschedule your appointment, please contact [facility]  Dept: [phone]  and follow the prompts.  Office hours are 8:00 a.m. to 4:30 p.m. Monday - Friday. Please note that voicemails left after 4:00 p.m. may not be returned until the following business day.  We are closed weekends and major holidays. You have access to a nurse at all times for urgent questions. Please call the main number to the clinic Dept: [phone] and follow the prompts.   For any non-urgent questions, you may also contact your provider using MyChart. We now offer e-Visits for anyone 18 and older to request care online for non-urgent symptoms. For details visit [email or web address].   Also download the MyChart app! Go to the app store, search "MyChart", open the app, select [facility], and log in with your MyChart username and password.   

## 2022-12-13 NOTE — Progress Notes (Signed)
Patient seen by MD today  Vitals are within treatment parameters.  Labs reviewed: and are within treatment parameters.  Per physician team, patient is ready for treatment and there are NO modifications to the treatment plan.

## 2022-12-13 NOTE — Progress Notes (Signed)
[facility] [phone]   [phone]  OFFICE PROGRESS NOTE  Bernerd Limbo, MD [address] [place]  [address]  DIAGNOSIS: Lower extremity melanoma diagnosed in 2017.  She developed stage IV disease with abdominal adenopathy in May of 2022 with BRAF negative.  1) Initially diagnosed with Superficial spreading melanoma of the lower extremity diagnosed in August 2017. She was found to have 1.25 mm thickness without ulceration or lymphovascular invasion. She had 1 out of 2 lymph nodes positive. The size of the lymph node measuring 0.25 x 0.95 mm with pathological staging T2bN1.  2) Superficial spreading melanoma of the right shoulder diagnosed in August 2017.She is status post wide local excision and found tohave a 0.72 mm. These findings indicate a pathological staging of T1 NA.    PRIOR THERAPY: 1) She is status post local excision followed by sentinel lymph node sampling with 2 lymph nodes sampled one was involved with metastatic melanoma. The measurement was 0.25 x 0.95 mm in dimension of the capsule. Her final pathological stage stage is T2bN1. This was completed on 05/23/2016.   2) Nivolumab total of 240 mg dose every 2 weeks for total of 4 doses. Started on 07/21/2016.  She completed 4 cycles of therapy for a total of 2 months out of planned 12.  Patient opted out continuing immunotherapy.   3) She is status post lymph node biopsy completed on Feb 15, 2021.   4) Ipilimumab 3 mg/kg with a nivolumab at 1 mg/kg started on Mar 04, 2021.  She is status post 4 cycles of therapy with excellent response and minimal residual disease.   CURRENT THERAPY:  Nivolumab 480 mg every 4 weeks started on May 27, 2021.  She returns for the next cycle of treatment with tentative plan to complete 2 years of therapy in April 2024.  INTERVAL HISTORY: CHEYANNA Kirk 62 y.o. female returns to the clinic today for follow-up visit accompanied by her  husband.  The patient is feeling fine today with no concerning complaints.  She has been tolerating her treatment with maintenance nivolumab fairly well.  She denied having any current chest pain, shortness of breath, cough or hemoptysis.  She has no nausea, vomiting, diarrhea or constipation.  She has no itching or skin rash.  She is here today for evaluation before starting cycle #29 of her treatment.  MEDICAL HISTORY: Past Medical History:  Diagnosis Date   Allergy    Cancer ([place])    Melanoma   Hyperlipidemia    IBS (irritable bowel syndrome)    Osteopenia    PONV (postoperative nausea and vomiting)    Thyroid nodule     ALLERGIES:  is allergic to codeine.  MEDICATIONS:  Current Outpatient Medications  Medication Sig Dispense Refill   Biotin 1000 MCG CHEW Chew by mouth.     calcium carbonate (OS-CAL) 600 MG TABS Take 600 mg by mouth 2 (two) times daily with a meal.     cholecalciferol (VITAMIN D) 1000 UNITS tablet Take 5,000 Units by mouth daily.     Cyanocobalamin (VITAMIN B-12 CR PO) Take 1 tablet by mouth daily.     ELDERBERRY PO Take by mouth.     loratadine (CLARITIN) 10 MG tablet Take 10 mg by mouth daily.     Multiple Vitamins-Minerals (ZINC PO) Take by mouth.     Omega-3 Fatty Acids (FISH OIL) 1200 MG CAPS Take by mouth.     No current  facility-administered medications for this visit.    SURGICAL HISTORY:  Past Surgical History:  Procedure Laterality Date   ABDOMINAL HYSTERECTOMY  1991   DILATION AND CURETTAGE OF UTERUS  1988   IR US GUIDANCE  02/15/2021   MELANOMA EXCISION Right 05/23/2016   Procedure: WIDE  EXCISION RIGHT SHOULDER MELANOMA AND WIDE EXCISION LEFT LOWER LEG MELANOMA;  Surgeon: Jackolyn Confer, MD;  Location: [facility];  Service: General;  Laterality: Right;  right shoulder and left lower leg sites   SENTINEL NODE BIOPSY Left 05/23/2016   Procedure: LEFT INGUINAL SENTINEL LYMPH  NODE BIOPSY;  Surgeon: Jackolyn Confer, MD;  Location: [facility];  Service: General;  Laterality: Left;   THYROID SURGERY  07/2015   NODULE    REVIEW OF SYSTEMS:  A comprehensive review of systems was negative.   PHYSICAL EXAMINATION: General appearance: alert, cooperative, and no distress Head: Normocephalic, without obvious abnormality, atraumatic Neck: no adenopathy, no JVD, supple, symmetrical, trachea midline, and thyroid not enlarged, symmetric, no tenderness/mass/nodules Lymph nodes: Cervical, supraclavicular, and axillary nodes normal. Resp: clear to auscultation bilaterally Back: symmetric, no curvature. ROM normal. No CVA tenderness. Cardio: regular rate and rhythm, S1, S2 normal, no murmur, click, rub or gallop GI: soft, non-tender; bowel sounds normal; no masses,  no organomegaly Extremities: extremities normal, atraumatic, no cyanosis or edema  ECOG PERFORMANCE STATUS: 1 - Symptomatic but completely ambulatory  Blood pressure 108/78, pulse 63, temperature (!) 97.5 F (36.4 C), temperature source Oral, resp. rate 18, weight 166 lb 3 oz (75.4 kg), last menstrual period 06/30/1990, SpO2 98 %.  LABORATORY DATA: Lab Results  Component Value Date   WBC 3.9 (L) 12/13/2022   HGB 14.0 12/13/2022   HCT 41.0 12/13/2022   MCV 83.7 12/13/2022   PLT 217 12/13/2022      Chemistry      Component Value Date/Time   NA 139 11/15/2022 1426   NA 140 09/08/2016 0755   K 3.7 11/15/2022 1426   K 3.9 09/08/2016 0755   CL 102 11/15/2022 1426   CO2 26 11/15/2022 1426   CO2 23 09/08/2016 0755   BUN 13 11/15/2022 1426   BUN 11.2 09/08/2016 0755   CREATININE 0.59 11/15/2022 1426   CREATININE 0.61 09/30/2019 0839   CREATININE 0.7 09/08/2016 0755      Component Value Date/Time   CALCIUM 9.0 11/15/2022 1426   CALCIUM 9.1 09/08/2016 0755   ALKPHOS 56 11/15/2022 1426   ALKPHOS 61 09/08/2016 0755   AST 18 11/15/2022 1426   AST 13 09/08/2016 0755   ALT 23 11/15/2022 1426   ALT 16 09/08/2016 0755   BILITOT 0.3 11/15/2022 1426    BILITOT 0.42 09/08/2016 0755       RADIOGRAPHIC STUDIES: No results found.  ASSESSMENT AND PLAN: This is a very pleasant 62 years old white female with Lower extremity melanoma diagnosed in 2017.  She developed stage IV disease with abdominal adenopathy in May of 2022 with BRAF negative.  1) Initially diagnosed with Superficial spreading melanoma of the lower extremity diagnosed in August 2017. She was found to have 1.25 mm thickness without ulceration or lymphovascular invasion. She had 1 out of 2 lymph nodes positive. The size of the lymph node measuring 0.25 x 0.95 mm with pathological staging T2bN1.  2) Superficial spreading melanoma of the right shoulder diagnosed in August 2017.She is status post wide local excision and found tohave a 0.72 mm. These findings indicate a pathological staging of T1 NA.  The patient is status post the following treatment. 1) status post local excision followed by sentinel lymph node sampling with 2 lymph nodes sampled one was involved with metastatic melanoma. The measurement was 0.25 x 0.95 mm in dimension of the capsule. Her final pathological stage stage is T2bN1. This was completed on 05/23/2016.   2) Nivolumab total of 240 mg dose every 2 weeks for total of 4 doses. Started on 07/21/2016.  She completed 4 cycles of therapy for a total of 2 months out of planned 12.  Patient opted out continuing immunotherapy.   3) status post lymph node biopsy completed on Feb 15, 2021.   4) Ipilimumab 3 mg/kg with a nivolumab at 1 mg/kg started on Mar 04, 2021.  She is status post 4 cycles of therapy with excellent response and minimal residual disease.  5) she is currently on maintenance treatment with nivolumab 480 Mg IV every 4 weeks status post 28 cycles.  The patient has been tolerating this treatment well with no concerning adverse effects. I recommended for her to proceed with cycle #29 today as planned. She will come back for follow-up visit in 4 weeks for  evaluation before the next cycle of her treatment. The patient was advised to call immediately if she has any other concerning symptoms in the interval. The patient voices understanding of current disease status and treatment options and is in agreement with the current care plan.  All questions were answered. The patient knows to call the clinic with any problems, questions or concerns. We can certainly see the patient much sooner if necessary.  The total time spent in the appointment was 20 minutes.  Disclaimer: This note was dictated with voice recognition software. Similar sounding words can inadvertently be transcribed and may not be corrected upon review.

## 2022-12-14 ENCOUNTER — Other Ambulatory Visit: Payer: Self-pay

## 2022-12-15 DIAGNOSIS — R7309 Other abnormal glucose: Secondary | ICD-10-CM | POA: Diagnosis not present

## 2022-12-15 LAB — T4: T4, Total: 6.3 ug/dL (ref 4.5–12.0)

## 2022-12-20 ENCOUNTER — Other Ambulatory Visit: Payer: Self-pay | Admitting: Hematology and Oncology

## 2022-12-20 ENCOUNTER — Other Ambulatory Visit: Payer: Self-pay | Admitting: Internal Medicine

## 2022-12-20 DIAGNOSIS — C439 Malignant melanoma of skin, unspecified: Secondary | ICD-10-CM

## 2022-12-20 NOTE — Progress Notes (Signed)
ON [facility] - Melanoma and Other Skin Cancers  No Change  Continue With Treatment as Ordered.  Original Decision Date/Time: 02/21/2021 09:17     Cycles 1 through 4: A cycle is every 21 days:     Nivolumab      Ipilimumab    Cycles 5 and beyond: A cycle is every 14 days:     Nivolumab   **Always confirm dose/schedule in your pharmacy ordering system**  Patient Characteristics: Melanoma, Cutaneous/Unknown Primary, Distant Metastases or Unresectable Local Recurrence, Unresectable, Symptomatic, First Line, BRAF V600 Wild Type / BRAF V600 Results Pending or Unknown, Candidate for Immunotherapy Disease Classification: Melanoma Disease Subtype: Cutaneous BRAF V600 Mutation Status: Awaiting BRAF V600 Results Therapeutic Status: Distant Metastases Metastatic Disease Type: Symptomatic Line of Therapy: First Line Immunotherapy Candidate Status: Candidate for Immunotherapy Intent of Therapy: Non-Curative / Palliative Intent, Discussed with Patient

## 2023-01-10 ENCOUNTER — Encounter: Payer: Self-pay | Admitting: Medical Oncology

## 2023-01-10 ENCOUNTER — Encounter: Payer: Self-pay | Admitting: Internal Medicine

## 2023-01-10 ENCOUNTER — Inpatient Hospital Stay: Payer: BC Managed Care – PPO | Attending: Oncology

## 2023-01-10 ENCOUNTER — Inpatient Hospital Stay: Payer: BC Managed Care – PPO

## 2023-01-10 ENCOUNTER — Inpatient Hospital Stay (HOSPITAL_BASED_OUTPATIENT_CLINIC_OR_DEPARTMENT_OTHER): Payer: BC Managed Care – PPO | Admitting: Internal Medicine

## 2023-01-10 DIAGNOSIS — C439 Malignant melanoma of skin, unspecified: Secondary | ICD-10-CM

## 2023-01-10 DIAGNOSIS — Z79620 Long term (current) use of immunosuppressive biologic: Secondary | ICD-10-CM | POA: Diagnosis not present

## 2023-01-10 DIAGNOSIS — C4370 Malignant melanoma of unspecified lower limb, including hip: Secondary | ICD-10-CM | POA: Insufficient documentation

## 2023-01-10 DIAGNOSIS — Z5112 Encounter for antineoplastic immunotherapy: Secondary | ICD-10-CM | POA: Insufficient documentation

## 2023-01-10 LAB — CBC WITH DIFFERENTIAL (CANCER CENTER ONLY)
Abs Immature Granulocytes: 0.01 10*3/uL (ref 0.00–0.07)
Basophils Absolute: 0.1 10*3/uL (ref 0.0–0.1)
Basophils Relative: 1 %
Eosinophils Absolute: 0.1 10*3/uL (ref 0.0–0.5)
Eosinophils Relative: 2 %
HCT: 42.2 % (ref 36.0–46.0)
Hemoglobin: 14.1 g/dL (ref 12.0–15.0)
Immature Granulocytes: 0 %
Lymphocytes Relative: 33 %
Lymphs Abs: 1.5 10*3/uL (ref 0.7–4.0)
MCH: 28.2 pg (ref 26.0–34.0)
MCHC: 33.4 g/dL (ref 30.0–36.0)
MCV: 84.4 fL (ref 80.0–100.0)
Monocytes Absolute: 0.4 10*3/uL (ref 0.1–1.0)
Monocytes Relative: 9 %
Neutro Abs: 2.6 10*3/uL (ref 1.7–7.7)
Neutrophils Relative %: 55 %
Platelet Count: 232 10*3/uL (ref 150–400)
RBC: 5 MIL/uL (ref 3.87–5.11)
RDW: 13.2 % (ref 11.5–15.5)
WBC Count: 4.6 10*3/uL (ref 4.0–10.5)
nRBC: 0 % (ref 0.0–0.2)

## 2023-01-10 LAB — CMP (CANCER CENTER ONLY)
ALT: 18 U/L (ref 0–44)
AST: 16 U/L (ref 15–41)
Albumin: 4.4 g/dL (ref 3.5–5.0)
Alkaline Phosphatase: 58 U/L (ref 38–126)
Anion gap: 7 (ref 5–15)
BUN: 14 mg/dL (ref 8–23)
CO2: 27 mmol/L (ref 22–32)
Calcium: 9.5 mg/dL (ref 8.9–10.3)
Chloride: 107 mmol/L (ref 98–111)
Creatinine: 0.62 mg/dL (ref 0.44–1.00)
GFR, Estimated: >60 mL/min (ref >60)
Glucose, Bld: 94 mg/dL (ref 70–99)
Potassium: 4.1 mmol/L (ref 3.5–5.1)
Sodium: 141 mmol/L (ref 135–145)
Total Bilirubin: 0.5 mg/dL (ref 0.3–1.2)
Total Protein: 7 g/dL (ref 6.5–8.1)

## 2023-01-10 LAB — TSH: TSH: 3.508 u[IU]/mL (ref 0.350–4.500)

## 2023-01-10 MED ORDER — SODIUM CHLORIDE 0.9 % IV SOLN: Freq: Once | INTRAVENOUS | Status: AC

## 2023-01-10 MED ORDER — SODIUM CHLORIDE 0.9 % IV SOLN
480.0000 mg | Freq: Once | INTRAVENOUS | Status: AC
  Administered 2023-01-10: 480 mg via INTRAVENOUS
  Filled 2023-01-10: qty 48

## 2023-01-10 MED ORDER — SODIUM CHLORIDE 0.9% FLUSH: 10.0000 mL | INTRAVENOUS | Status: DC | PRN

## 2023-01-10 MED ORDER — HEPARIN SOD (PORK) LOCK FLUSH 100 UNIT/ML IV SOLN: 500.0000 [IU] | Freq: Once | INTRAVENOUS | Status: DC | PRN

## 2023-01-10 NOTE — Progress Notes (Signed)
Patient seen by MD today  Vitals are within treatment parameters.  Labs reviewed: and are within treatment parameters.  Per physician team, patient is ready for treatment and there are NO modifications to the treatment plan.  

## 2023-01-10 NOTE — Progress Notes (Signed)
[facility] [phone]   Fax:([phone]) [phone]  OFFICE PROGRESS NOTE  Bernerd Limbo, MD [address] [place] [address]  DIAGNOSIS: Lower extremity melanoma diagnosed in 2017.  She developed stage IV disease with abdominal adenopathy in May of 2022 with BRAF negative.  1) Initially diagnosed with Superficial spreading melanoma of the lower extremity diagnosed in August 2017. She was found to have 1.25 mm thickness without ulceration or lymphovascular invasion. She had 1 out of 2 lymph nodes positive. The size of the lymph node measuring 0.25 x 0.95 mm with pathological staging T2bN1.  2) Superficial spreading melanoma of the right shoulder diagnosed in August 2017.She is status post wide local excision and found tohave a 0.72 mm. These findings indicate a pathological staging of T1 NA.    PRIOR THERAPY: 1) She is status post local excision followed by sentinel lymph node sampling with 2 lymph nodes sampled one was involved with metastatic melanoma. The measurement was 0.25 x 0.95 mm in dimension of the capsule. Her final pathological stage stage is T2bN1. This was completed on 05/23/2016.   2) Nivolumab total of 240 mg dose every 2 weeks for total of 4 doses. Started on 07/21/2016.  She completed 4 cycles of therapy for a total of 2 months out of planned 12.  Patient opted out continuing immunotherapy.   3) She is status post lymph node biopsy completed on Feb 15, 2021.   4) Ipilimumab 3 mg/kg with a nivolumab at 1 mg/kg started on Mar 04, 2021.  She is status post 4 cycles of therapy with excellent response and minimal residual disease.   CURRENT THERAPY:  Nivolumab 480 mg every 4 weeks started on May 27, 2021.  Status post 29 cycles with tentative plan to complete 2 years of therapy in April 2024.  INTERVAL HISTORY: JURNEY REINING 62 y.o. female returns to the clinic today for follow-up visit accompanied by her husband.  The patient  is feeling fine today with no concerning complaints except for abdominal distention.  She denied having any current chest pain, shortness of breath, cough or hemoptysis.  She has no nausea, vomiting, diarrhea or constipation.  She has no headache or visual changes.  She denied having any significant weight loss or night sweats.  She is here today for evaluation before starting cycle #30 of her treatment.  MEDICAL HISTORY: Past Medical History:  Diagnosis Date   Allergy    Cancer ([place])    Melanoma   Hyperlipidemia    IBS (irritable bowel syndrome)    Osteopenia    PONV (postoperative nausea and vomiting)    Thyroid nodule     ALLERGIES:  is allergic to codeine.  MEDICATIONS:  Current Outpatient Medications  Medication Sig Dispense Refill   Biotin 1000 MCG CHEW Chew by mouth.     calcium carbonate (OS-CAL) 600 MG TABS Take 600 mg by mouth 2 (two) times daily with a meal.     cholecalciferol (VITAMIN D) 1000 UNITS tablet Take 5,000 Units by mouth daily.     Cyanocobalamin (VITAMIN B-12 CR PO) Take 1 tablet by mouth daily.     ELDERBERRY PO Take by mouth.     loratadine (CLARITIN) 10 MG tablet Take 10 mg by mouth daily.     Multiple Vitamins-Minerals (ZINC PO) Take by mouth.     Omega-3 Fatty Acids (FISH OIL) 1200 MG CAPS Take by mouth.     No current facility-administered  medications for this visit.    SURGICAL HISTORY:  Past Surgical History:  Procedure Laterality Date   ABDOMINAL HYSTERECTOMY  1991   DILATION AND CURETTAGE OF UTERUS  1988   IR US GUIDANCE  02/15/2021   MELANOMA EXCISION Right 05/23/2016   Procedure: WIDE  EXCISION RIGHT SHOULDER MELANOMA AND WIDE EXCISION LEFT LOWER LEG MELANOMA;  Surgeon: Jackolyn Confer, MD;  Location: [facility];  Service: General;  Laterality: Right;  right shoulder and left lower leg sites   SENTINEL NODE BIOPSY Left 05/23/2016   Procedure: LEFT INGUINAL SENTINEL LYMPH  NODE BIOPSY;  Surgeon: Jackolyn Confer, MD;  Location: [facility];  Service: General;  Laterality: Left;   THYROID SURGERY  07/2015   NODULE    REVIEW OF SYSTEMS:  A comprehensive review of systems was negative except for: Gastrointestinal: positive for abdominal bloating    PHYSICAL EXAMINATION: General appearance: alert, cooperative, and no distress Head: Normocephalic, without obvious abnormality, atraumatic Neck: no adenopathy, no JVD, supple, symmetrical, trachea midline, and thyroid not enlarged, symmetric, no tenderness/mass/nodules Lymph nodes: Cervical, supraclavicular, and axillary nodes normal. Resp: clear to auscultation bilaterally Back: symmetric, no curvature. ROM normal. No CVA tenderness. Cardio: regular rate and rhythm, S1, S2 normal, no murmur, click, rub or gallop GI: soft, non-tender; bowel sounds normal; no masses,  no organomegaly Extremities: extremities normal, atraumatic, no cyanosis or edema  ECOG PERFORMANCE STATUS: 1 - Symptomatic but completely ambulatory  Blood pressure 106/70, pulse 76, temperature 97.8 F (36.6 C), temperature source Oral, resp. rate 16, weight 163 lb 8 oz (74.2 kg), last menstrual period 06/30/1990, SpO2 94 %.  LABORATORY DATA: Lab Results  Component Value Date   WBC 4.6 01/10/2023   HGB 14.1 01/10/2023   HCT 42.2 01/10/2023   MCV 84.4 01/10/2023   PLT 232 01/10/2023      Chemistry      Component Value Date/Time   NA 141 01/10/2023 0813   NA 140 09/08/2016 0755   K 4.1 01/10/2023 0813   K 3.9 09/08/2016 0755   CL 107 01/10/2023 0813   CO2 27 01/10/2023 0813   CO2 23 09/08/2016 0755   BUN 14 01/10/2023 0813   BUN 11.2 09/08/2016 0755   CREATININE 0.62 01/10/2023 0813   CREATININE 0.61 09/30/2019 0839   CREATININE 0.7 09/08/2016 0755      Component Value Date/Time   CALCIUM 9.5 01/10/2023 0813   CALCIUM 9.1 09/08/2016 0755   ALKPHOS 58 01/10/2023 0813   ALKPHOS 61 09/08/2016 0755   AST 16 01/10/2023 0813   AST 13 09/08/2016 0755   ALT 18 01/10/2023 0813    ALT 16 09/08/2016 0755   BILITOT 0.5 01/10/2023 0813   BILITOT 0.42 09/08/2016 0755       RADIOGRAPHIC STUDIES: No results found.  ASSESSMENT AND PLAN: This is a very pleasant 62 years old white female with Lower extremity melanoma diagnosed in 2017.  She developed stage IV disease with abdominal adenopathy in May of 2022 with BRAF negative.  1) Initially diagnosed with Superficial spreading melanoma of the lower extremity diagnosed in August 2017. She was found to have 1.25 mm thickness without ulceration or lymphovascular invasion. She had 1 out of 2 lymph nodes positive. The size of the lymph node measuring 0.25 x 0.95 mm with pathological staging T2bN1.  2) Superficial spreading melanoma of the right shoulder diagnosed in August 2017.She is status post wide local excision and found tohave a 0.72 mm. These findings indicate a  pathological staging of T1 NA.  The patient is status post the following treatment. 1) status post local excision followed by sentinel lymph node sampling with 2 lymph nodes sampled one was involved with metastatic melanoma. The measurement was 0.25 x 0.95 mm in dimension of the capsule. Her final pathological stage stage is T2bN1. This was completed on 05/23/2016.   2) Nivolumab total of 240 mg dose every 2 weeks for total of 4 doses. Started on 07/21/2016.  She completed 4 cycles of therapy for a total of 2 months out of planned 12.  Patient opted out continuing immunotherapy.   3) status post lymph node biopsy completed on Feb 15, 2021.   4) Ipilimumab 3 mg/kg with a nivolumab at 1 mg/kg started on Mar 04, 2021.  She is status post 4 cycles of therapy with excellent response and minimal residual disease.  5) she is currently on maintenance treatment with nivolumab 480 Mg IV every 4 weeks status post 29 cycles.  The patient has been tolerating this treatment well with no concerning adverse effects.  I recommended for her to proceed with cycle #8 today as planned. She  will come back for follow-up visit in 4 weeks for evaluation before the last cycle of her treatment. I will repeat her imaging studies after the last cycle of her treatment with full body PET scan. The patient was advised to call immediately if she has any other concerning symptoms in the interval.  The patient voices understanding of current disease status and treatment options and is in agreement with the current care plan.  All questions were answered. The patient knows to call the clinic with any problems, questions or concerns. We can certainly see the patient much sooner if necessary.  The total time spent in the appointment was 20 minutes.  Disclaimer: This note was dictated with voice recognition software. Similar sounding words can inadvertently be transcribed and may not be corrected upon review.

## 2023-01-10 NOTE — Patient Instructions (Signed)
[facility]  Discharge Instructions: Thank you for choosing [facility] to provide your oncology and hematology care.   If you have a lab appointment with the Cancer Center, please go directly to the Cancer Center and check in at the registration area.   Wear comfortable clothing and clothing appropriate for easy access to any Portacath or PICC line.   We strive to give you quality time with your provider. You may need to reschedule your appointment if you arrive late (15 or more minutes).  Arriving late affects you and other patients whose appointments are after yours.  Also, if you miss three or more appointments without notifying the office, you may be dismissed from the clinic at the provider's discretion.      For prescription refill requests, have your pharmacy contact our office and allow 72 hours for refills to be completed.    Today you received the following chemotherapy and/or immunotherapy agents: Opdivo      To help prevent nausea and vomiting after your treatment, we encourage you to take your nausea medication as directed.  BELOW ARE SYMPTOMS THAT SHOULD BE REPORTED IMMEDIATELY: *FEVER GREATER THAN 100.4 F (38 C) OR HIGHER *CHILLS OR SWEATING *NAUSEA AND VOMITING THAT IS NOT CONTROLLED WITH YOUR NAUSEA MEDICATION *UNUSUAL SHORTNESS OF BREATH *UNUSUAL BRUISING OR BLEEDING *URINARY PROBLEMS (pain or burning when urinating, or frequent urination) *BOWEL PROBLEMS (unusual diarrhea, constipation, pain near the anus) TENDERNESS IN MOUTH AND THROAT WITH OR WITHOUT PRESENCE OF ULCERS (sore throat, sores in mouth, or a toothache) UNUSUAL RASH, SWELLING OR PAIN  UNUSUAL VAGINAL DISCHARGE OR ITCHING   Items with * indicate a potential emergency and should be followed up as soon as possible or go to the Emergency Department if any problems should occur.  Please show the CHEMOTHERAPY ALERT CARD or IMMUNOTHERAPY ALERT CARD at check-in  to the Emergency Department and triage nurse.  Should you have questions after your visit or need to cancel or reschedule your appointment, please contact [facility]  Dept: [phone]  and follow the prompts.  Office hours are 8:00 a.m. to 4:30 p.m. Monday - Friday. Please note that voicemails left after 4:00 p.m. may not be returned until the following business day.  We are closed weekends and major holidays. You have access to a nurse at all times for urgent questions. Please call the main number to the clinic Dept: [phone] and follow the prompts.   For any non-urgent questions, you may also contact your provider using MyChart. We now offer e-Visits for anyone 18 and older to request care online for non-urgent symptoms. For details visit [email or web address].   Also download the MyChart app! Go to the app store, search "MyChart", open the app, select [facility], and log in with your MyChart username and password.   

## 2023-01-11 ENCOUNTER — Telehealth: Payer: Self-pay | Admitting: Medical Oncology

## 2023-01-11 ENCOUNTER — Other Ambulatory Visit: Payer: Self-pay | Admitting: Internal Medicine

## 2023-01-11 DIAGNOSIS — C439 Malignant melanoma of skin, unspecified: Secondary | ICD-10-CM

## 2023-01-11 LAB — T4: T4, Total: 6.6 ug/dL (ref 4.5–12.0)

## 2023-01-11 NOTE — Telephone Encounter (Signed)
She is terminated and want to get scans before her next infusion.  Mohamed ordered scans for April 22.

## 2023-01-15 DIAGNOSIS — R7309 Other abnormal glucose: Secondary | ICD-10-CM | POA: Diagnosis not present

## 2023-01-16 ENCOUNTER — Other Ambulatory Visit: Payer: Self-pay

## 2023-02-02 ENCOUNTER — Encounter (HOSPITAL_COMMUNITY)
Admission: RE | Admit: 2023-02-02 | Discharge: 2023-02-02 | Disposition: A | Discharge status: Home / Self Care | Payer: BC Managed Care – PPO | Source: Ambulatory Visit | Attending: Internal Medicine | Admitting: Internal Medicine

## 2023-02-02 DIAGNOSIS — Z85820 Personal history of malignant melanoma of skin: Secondary | ICD-10-CM | POA: Diagnosis not present

## 2023-02-02 DIAGNOSIS — Z8572 Personal history of non-Hodgkin lymphomas: Secondary | ICD-10-CM | POA: Diagnosis not present

## 2023-02-02 DIAGNOSIS — Z08 Encounter for follow-up examination after completed treatment for malignant neoplasm: Secondary | ICD-10-CM | POA: Diagnosis not present

## 2023-02-02 DIAGNOSIS — C439 Malignant melanoma of skin, unspecified: Secondary | ICD-10-CM | POA: Diagnosis not present

## 2023-02-02 LAB — GLUCOSE, CAPILLARY: Glucose-Capillary: 109 mg/dL — ABNORMAL HIGH (ref 70–99)

## 2023-02-02 MED ORDER — FLUDEOXYGLUCOSE F - 18 (FDG) INJECTION
8.2000 | Freq: Once | INTRAVENOUS | Status: AC
  Administered 2023-02-02: 7.98 via INTRAVENOUS

## 2023-02-07 ENCOUNTER — Inpatient Hospital Stay (HOSPITAL_BASED_OUTPATIENT_CLINIC_OR_DEPARTMENT_OTHER): Payer: BC Managed Care – PPO | Admitting: Internal Medicine

## 2023-02-07 ENCOUNTER — Encounter: Payer: Self-pay | Admitting: Medical Oncology

## 2023-02-07 ENCOUNTER — Inpatient Hospital Stay: Payer: BC Managed Care – PPO

## 2023-02-07 ENCOUNTER — Encounter: Payer: Self-pay | Admitting: Internal Medicine

## 2023-02-07 ENCOUNTER — Inpatient Hospital Stay: Payer: BC Managed Care – PPO | Attending: Oncology

## 2023-02-07 VITALS — BP 110/68 | HR 65

## 2023-02-07 DIAGNOSIS — C439 Malignant melanoma of skin, unspecified: Secondary | ICD-10-CM | POA: Diagnosis not present

## 2023-02-07 DIAGNOSIS — C4370 Malignant melanoma of unspecified lower limb, including hip: Secondary | ICD-10-CM | POA: Insufficient documentation

## 2023-02-07 DIAGNOSIS — Z5112 Encounter for antineoplastic immunotherapy: Secondary | ICD-10-CM | POA: Insufficient documentation

## 2023-02-07 LAB — CBC WITH DIFFERENTIAL (CANCER CENTER ONLY)
Abs Immature Granulocytes: 0.01 10*3/uL (ref 0.00–0.07)
Basophils Absolute: 0 10*3/uL (ref 0.0–0.1)
Basophils Relative: 1 %
Eosinophils Absolute: 0.1 10*3/uL (ref 0.0–0.5)
Eosinophils Relative: 2 %
HCT: 43 % (ref 36.0–46.0)
Hemoglobin: 14.4 g/dL (ref 12.0–15.0)
Immature Granulocytes: 0 %
Lymphocytes Relative: 34 %
Lymphs Abs: 1.5 10*3/uL (ref 0.7–4.0)
MCH: 28.3 pg (ref 26.0–34.0)
MCHC: 33.5 g/dL (ref 30.0–36.0)
MCV: 84.6 fL (ref 80.0–100.0)
Monocytes Absolute: 0.4 10*3/uL (ref 0.1–1.0)
Monocytes Relative: 8 %
Neutro Abs: 2.4 10*3/uL (ref 1.7–7.7)
Neutrophils Relative %: 55 %
Platelet Count: 234 10*3/uL (ref 150–400)
RBC: 5.08 MIL/uL (ref 3.87–5.11)
RDW: 13.5 % (ref 11.5–15.5)
WBC Count: 4.3 10*3/uL (ref 4.0–10.5)
nRBC: 0 % (ref 0.0–0.2)

## 2023-02-07 LAB — CMP (CANCER CENTER ONLY)
ALT: 19 U/L (ref 0–44)
AST: 16 U/L (ref 15–41)
Albumin: 4.7 g/dL (ref 3.5–5.0)
Alkaline Phosphatase: 60 U/L (ref 38–126)
Anion gap: 5 (ref 5–15)
BUN: 14 mg/dL (ref 8–23)
CO2: 31 mmol/L (ref 22–32)
Calcium: 9.7 mg/dL (ref 8.9–10.3)
Chloride: 103 mmol/L (ref 98–111)
Creatinine: 0.6 mg/dL (ref 0.44–1.00)
GFR, Estimated: >60 mL/min (ref >60)
Glucose, Bld: 93 mg/dL (ref 70–99)
Potassium: 4 mmol/L (ref 3.5–5.1)
Sodium: 139 mmol/L (ref 135–145)
Total Bilirubin: 0.4 mg/dL (ref 0.3–1.2)
Total Protein: 7.3 g/dL (ref 6.5–8.1)

## 2023-02-07 MED ORDER — SODIUM CHLORIDE 0.9 % IV SOLN: Freq: Once | INTRAVENOUS | Status: AC

## 2023-02-07 MED ORDER — SODIUM CHLORIDE 0.9 % IV SOLN
480.0000 mg | Freq: Once | INTRAVENOUS | Status: AC
  Administered 2023-02-07: 480 mg via INTRAVENOUS
  Filled 2023-02-07: qty 48

## 2023-02-07 NOTE — Progress Notes (Signed)
[facility] [phone]   [phone]  OFFICE PROGRESS NOTE  [name], MD [address] [place] [address]  DIAGNOSIS: Lower extremity melanoma diagnosed in 2017.  She developed stage IV disease with abdominal adenopathy in May of 2022 with BRAF negative.  1) Initially diagnosed with Superficial spreading melanoma of the lower extremity diagnosed in August 2017. She was found to have 1.25 mm thickness without ulceration or lymphovascular invasion. She had 1 out of 2 lymph nodes positive. The size of the lymph node measuring 0.25 x 0.95 mm with pathological staging T2bN1.  2) Superficial spreading melanoma of the right shoulder diagnosed in August 2017.She is status post wide local excision and found tohave a 0.72 mm. These findings indicate a pathological staging of T1 NA.    PRIOR THERAPY: 1) She is status post local excision followed by sentinel lymph node sampling with 2 lymph nodes sampled one was involved with metastatic melanoma. The measurement was 0.25 x 0.95 mm in dimension of the capsule. Her final pathological stage stage is T2bN1. This was completed on 05/23/2016.   2) Nivolumab total of 240 mg dose every 2 weeks for total of 4 doses. Started on 07/21/2016.  She completed 4 cycles of therapy for a total of 2 months out of planned 12.  Patient opted out continuing immunotherapy.   3) She is status post lymph node biopsy completed on Feb 15, 2021.   4) Ipilimumab 3 mg/kg with a nivolumab at 1 mg/kg started on Mar 04, 2021.  She is status post 4 cycles of therapy with excellent response and minimal residual disease.   CURRENT THERAPY:  Nivolumab 480 mg every 4 weeks started on May 27, 2021.  Status post 30 cycles with tentative plan to complete 2 years of therapy in April 2024.  INTERVAL HISTORY: Sharon Kirk 62 y.o. female returns to the clinic today for follow-up visit.  The patient is feeling fine today with  no concerning complaints.  She denied having any chest pain, shortness of breath, cough or hemoptysis.  She has no nausea, vomiting, diarrhea or constipation.  She has no headache or visual changes.  Unfortunately she lost her job recently and she is running out of insurance.  She had repeat PET scan performed recently and she is here for evaluation and discussion of her PET scan results before the last cycle of her treatment.   MEDICAL HISTORY: Past Medical History:  Diagnosis Date   Allergy    Cancer (HCC)    Melanoma   Hyperlipidemia    IBS (irritable bowel syndrome)    Osteopenia    PONV (postoperative nausea and vomiting)    Thyroid nodule     ALLERGIES:  is allergic to codeine.  MEDICATIONS:  Current Outpatient Medications  Medication Sig Dispense Refill   Biotin 1000 MCG CHEW Chew by mouth.     calcium carbonate (OS-CAL) 600 MG TABS Take 600 mg by mouth 2 (two) times daily with a meal.     cholecalciferol (VITAMIN D) 1000 UNITS tablet Take 5,000 Units by mouth daily.     Cyanocobalamin (VITAMIN B-12 CR PO) Take 1 tablet by mouth daily.     ELDERBERRY PO Take by mouth.     loratadine (CLARITIN) 10 MG tablet Take 10 mg by mouth daily.     Multiple Vitamins-Minerals (ZINC PO) Take by mouth.     Omega-3 Fatty Acids (FISH OIL) 1200 MG CAPS Take  by mouth.     No current facility-administered medications for this visit.    SURGICAL HISTORY:  Past Surgical History:  Procedure Laterality Date   ABDOMINAL HYSTERECTOMY  1991   DILATION AND CURETTAGE OF UTERUS  1988   IR US GUIDANCE  02/15/2021   MELANOMA EXCISION Right 05/23/2016   Procedure: WIDE  EXCISION RIGHT SHOULDER MELANOMA AND WIDE EXCISION LEFT LOWER LEG MELANOMA;  Surgeon: Avel Peace, MD;  Location: [facility];  Service: General;  Laterality: Right;  right shoulder and left lower leg sites   SENTINEL NODE BIOPSY Left 05/23/2016   Procedure: LEFT INGUINAL SENTINEL LYMPH  NODE BIOPSY;  Surgeon: Avel Peace, MD;  Location: [facility];  Service: General;  Laterality: Left;   THYROID SURGERY  07/2015   NODULE    REVIEW OF SYSTEMS:  Constitutional: negative Eyes: negative Ears, nose, mouth, throat, and face: negative Respiratory: negative Cardiovascular: negative Gastrointestinal: negative Genitourinary:negative Integument/breast: negative Hematologic/lymphatic: negative Musculoskeletal:negative Neurological: negative Behavioral/Psych: negative Endocrine: negative Allergic/Immunologic: negative   PHYSICAL EXAMINATION: General appearance: alert, cooperative, and no distress Head: Normocephalic, without obvious abnormality, atraumatic Neck: no adenopathy, no JVD, supple, symmetrical, trachea midline, and thyroid not enlarged, symmetric, no tenderness/mass/nodules Lymph nodes: Cervical, supraclavicular, and axillary nodes normal. Resp: clear to auscultation bilaterally Back: symmetric, no curvature. ROM normal. No CVA tenderness. Cardio: regular rate and rhythm, S1, S2 normal, no murmur, click, rub or gallop GI: soft, non-tender; bowel sounds normal; no masses,  no organomegaly Extremities: extremities normal, atraumatic, no cyanosis or edema Neurologic: Alert and oriented X 3, normal strength and tone. Normal symmetric reflexes. Normal coordination and gait  ECOG PERFORMANCE STATUS: 1 - Symptomatic but completely ambulatory  Blood pressure 106/70, pulse 76, temperature 97.8 F (36.6 C), temperature source Oral, resp. rate 16, weight 163 lb 8 oz (74.2 kg), last menstrual period 06/30/1990, SpO2 94 %.  LABORATORY DATA: Lab Results  Component Value Date   WBC 4.6 01/10/2023   HGB 14.1 01/10/2023   HCT 42.2 01/10/2023   MCV 84.4 01/10/2023   PLT 232 01/10/2023      Chemistry      Component Value Date/Time   NA 141 01/10/2023 0813   NA 140 09/08/2016 0755   K 4.1 01/10/2023 0813   K 3.9 09/08/2016 0755   CL 107 01/10/2023 0813   CO2 27 01/10/2023  0813   CO2 23 09/08/2016 0755   BUN 14 01/10/2023 0813   BUN 11.2 09/08/2016 0755   CREATININE 0.62 01/10/2023 0813   CREATININE 0.61 09/30/2019 0839   CREATININE 0.7 09/08/2016 0755      Component Value Date/Time   CALCIUM 9.5 01/10/2023 0813   CALCIUM 9.1 09/08/2016 0755   ALKPHOS 58 01/10/2023 0813   ALKPHOS 61 09/08/2016 0755   AST 16 01/10/2023 0813   AST 13 09/08/2016 0755   ALT 18 01/10/2023 0813   ALT 16 09/08/2016 0755   BILITOT 0.5 01/10/2023 0813   BILITOT 0.42 09/08/2016 0755       RADIOGRAPHIC STUDIES: No results found.  ASSESSMENT AND PLAN: This is a very pleasant 62 years old white female with Lower extremity melanoma diagnosed in 2017.  She developed stage IV disease with abdominal adenopathy in May of 2022 with BRAF negative.  1) Initially diagnosed with Superficial spreading melanoma of the lower extremity diagnosed in August 2017. She was found to have 1.25 mm thickness without ulceration or lymphovascular invasion. She had 1 out of 2 lymph nodes positive. The size  of the lymph node measuring 0.25 x 0.95 mm with pathological staging T2bN1.  2) Superficial spreading melanoma of the right shoulder diagnosed in August 2017.She is status post wide local excision and found tohave a 0.72 mm. These findings indicate a pathological staging of T1 NA.  The patient is status post the following treatment. 1) status post local excision followed by sentinel lymph node sampling with 2 lymph nodes sampled one was involved with metastatic melanoma. The measurement was 0.25 x 0.95 mm in dimension of the capsule. Her final pathological stage stage is T2bN1. This was completed on 05/23/2016.   2) Nivolumab total of 240 mg dose every 2 weeks for total of 4 doses. Started on 07/21/2016.  She completed 4 cycles of therapy for a total of 2 months out of planned 12.  Patient opted out continuing immunotherapy.   3) status post lymph node biopsy completed on Feb 15, 2021.   4) Ipilimumab  3 mg/kg with a nivolumab at 1 mg/kg started on Mar 04, 2021.  She is status post 4 cycles of therapy with excellent response and minimal residual disease.  5) she is currently on maintenance treatment with nivolumab 480 Mg IV every 4 weeks status post 30 cycles.  The patient has been tolerating this treatment well with no concerning adverse effects.   She had repeat PET scan performed recently.  I personally and independently reviewed the PET scan and discussed the result with the patient today. Her PET scan showed no concerning findings for disease recurrence or metastasis. I recommended for her to proceed with the last cycle of her treatment with immunotherapy today. I will see her back for follow-up visit in 6 months for evaluation and repeat blood work as well as CT scan of the chest, abdomen and pelvis for restaging of her disease. The patient was advised to call immediately if she has any other concerning symptoms in the interval.  The patient voices understanding of current disease status and treatment options and is in agreement with the current care plan.  All questions were answered. The patient knows to call the clinic with any problems, questions or concerns. We can certainly see the patient much sooner if necessary.  The total time spent in the appointment was 30 minutes.  Disclaimer: This note was dictated with voice recognition software. Similar sounding words can inadvertently be transcribed and may not be corrected upon review.

## 2023-02-07 NOTE — Patient Instructions (Signed)
[facility]  Discharge Instructions: Thank you for choosing [facility] to provide your oncology and hematology care.   If you have a lab appointment with the Cancer Center, please go directly to the Cancer Center and check in at the registration area.   Wear comfortable clothing and clothing appropriate for easy access to any Portacath or PICC line.   We strive to give you quality time with your provider. You may need to reschedule your appointment if you arrive late (15 or more minutes).  Arriving late affects you and other patients whose appointments are after yours.  Also, if you miss three or more appointments without notifying the office, you may be dismissed from the clinic at the provider's discretion.      For prescription refill requests, have your pharmacy contact our office and allow 72 hours for refills to be completed.    Today you received the following chemotherapy and/or immunotherapy agents: Opdivo      To help prevent nausea and vomiting after your treatment, we encourage you to take your nausea medication as directed.  BELOW ARE SYMPTOMS THAT SHOULD BE REPORTED IMMEDIATELY: *FEVER GREATER THAN 100.4 F (38 C) OR HIGHER *CHILLS OR SWEATING *NAUSEA AND VOMITING THAT IS NOT CONTROLLED WITH YOUR NAUSEA MEDICATION *UNUSUAL SHORTNESS OF BREATH *UNUSUAL BRUISING OR BLEEDING *URINARY PROBLEMS (pain or burning when urinating, or frequent urination) *BOWEL PROBLEMS (unusual diarrhea, constipation, pain near the anus) TENDERNESS IN MOUTH AND THROAT WITH OR WITHOUT PRESENCE OF ULCERS (sore throat, sores in mouth, or a toothache) UNUSUAL RASH, SWELLING OR PAIN  UNUSUAL VAGINAL DISCHARGE OR ITCHING   Items with * indicate a potential emergency and should be followed up as soon as possible or go to the Emergency Department if any problems should occur.  Please show the CHEMOTHERAPY ALERT CARD or IMMUNOTHERAPY ALERT CARD at check-in  to the Emergency Department and triage nurse.  Should you have questions after your visit or need to cancel or reschedule your appointment, please contact [facility]  Dept: [phone]  and follow the prompts.  Office hours are 8:00 a.m. to 4:30 p.m. Monday - Friday. Please note that voicemails left after 4:00 p.m. may not be returned until the following business day.  We are closed weekends and major holidays. You have access to a nurse at all times for urgent questions. Please call the main number to the clinic Dept: [phone] and follow the prompts.   For any non-urgent questions, you may also contact your provider using MyChart. We now offer e-Visits for anyone 18 and older to request care online for non-urgent symptoms. For details visit [email or web address].   Also download the MyChart app! Go to the app store, search "MyChart", open the app, select [facility], and log in with your MyChart username and password.   

## 2023-02-07 NOTE — Progress Notes (Signed)
Patient seen by Dr. Mohamed  Vitals are within treatment parameters.  Labs reviewed: and are within treatment parameters.  Per physician team, patient is ready for treatment and there are NO modifications to the treatment plan.  

## 2023-02-08 ENCOUNTER — Other Ambulatory Visit: Payer: Self-pay

## 2023-02-08 DIAGNOSIS — D225 Melanocytic nevi of trunk: Secondary | ICD-10-CM | POA: Diagnosis not present

## 2023-02-08 DIAGNOSIS — Z85820 Personal history of malignant melanoma of skin: Secondary | ICD-10-CM | POA: Diagnosis not present

## 2023-02-08 DIAGNOSIS — D224 Melanocytic nevi of scalp and neck: Secondary | ICD-10-CM | POA: Diagnosis not present

## 2023-02-08 DIAGNOSIS — B078 Other viral warts: Secondary | ICD-10-CM | POA: Diagnosis not present

## 2023-02-08 DIAGNOSIS — D2261 Melanocytic nevi of right upper limb, including shoulder: Secondary | ICD-10-CM | POA: Diagnosis not present

## 2023-02-14 DIAGNOSIS — R7309 Other abnormal glucose: Secondary | ICD-10-CM | POA: Diagnosis not present

## 2023-03-07 ENCOUNTER — Inpatient Hospital Stay: Payer: BC Managed Care – PPO

## 2023-03-07 ENCOUNTER — Inpatient Hospital Stay: Payer: BC Managed Care – PPO | Admitting: Internal Medicine

## 2023-03-17 DIAGNOSIS — R7309 Other abnormal glucose: Secondary | ICD-10-CM | POA: Diagnosis not present

## 2023-04-16 DIAGNOSIS — R7309 Other abnormal glucose: Secondary | ICD-10-CM | POA: Diagnosis not present

## 2023-04-17 DIAGNOSIS — D485 Neoplasm of uncertain behavior of skin: Secondary | ICD-10-CM | POA: Diagnosis not present

## 2023-04-17 DIAGNOSIS — L820 Inflamed seborrheic keratosis: Secondary | ICD-10-CM | POA: Diagnosis not present

## 2023-04-17 DIAGNOSIS — B078 Other viral warts: Secondary | ICD-10-CM | POA: Diagnosis not present

## 2023-05-17 DIAGNOSIS — R7309 Other abnormal glucose: Secondary | ICD-10-CM | POA: Diagnosis not present

## 2023-05-30 ENCOUNTER — Other Ambulatory Visit: Payer: Self-pay | Admitting: Nurse Practitioner

## 2023-05-30 DIAGNOSIS — Z1231 Encounter for screening mammogram for malignant neoplasm of breast: Secondary | ICD-10-CM

## 2023-05-31 ENCOUNTER — Other Ambulatory Visit: Payer: Self-pay

## 2023-06-17 DIAGNOSIS — R7309 Other abnormal glucose: Secondary | ICD-10-CM | POA: Diagnosis not present

## 2023-06-20 DIAGNOSIS — Z0000 Encounter for general adult medical examination without abnormal findings: Secondary | ICD-10-CM | POA: Diagnosis not present

## 2023-06-20 DIAGNOSIS — Z1330 Encounter for screening examination for mental health and behavioral disorders, unspecified: Secondary | ICD-10-CM | POA: Diagnosis not present

## 2023-06-20 DIAGNOSIS — Z6826 Body mass index (BMI) 26.0-26.9, adult: Secondary | ICD-10-CM | POA: Diagnosis not present

## 2023-06-20 DIAGNOSIS — Z13220 Encounter for screening for lipoid disorders: Secondary | ICD-10-CM | POA: Diagnosis not present

## 2023-07-17 DIAGNOSIS — R7309 Other abnormal glucose: Secondary | ICD-10-CM | POA: Diagnosis not present

## 2023-08-07 ENCOUNTER — Inpatient Hospital Stay: Payer: BC Managed Care – PPO | Attending: Pulmonary Disease

## 2023-08-07 ENCOUNTER — Ambulatory Visit (HOSPITAL_COMMUNITY)
Admission: RE | Admit: 2023-08-07 | Discharge: 2023-08-07 | Disposition: A | Discharge status: Home / Self Care | Payer: BC Managed Care – PPO | Source: Ambulatory Visit | Attending: Internal Medicine | Admitting: Internal Medicine

## 2023-08-07 DIAGNOSIS — I700 Atherosclerosis of aorta: Secondary | ICD-10-CM | POA: Diagnosis not present

## 2023-08-07 DIAGNOSIS — D7389 Other diseases of spleen: Secondary | ICD-10-CM | POA: Diagnosis not present

## 2023-08-07 DIAGNOSIS — M79604 Pain in right leg: Secondary | ICD-10-CM | POA: Insufficient documentation

## 2023-08-07 DIAGNOSIS — C4361 Malignant melanoma of right upper limb, including shoulder: Secondary | ICD-10-CM | POA: Diagnosis not present

## 2023-08-07 DIAGNOSIS — Z90710 Acquired absence of both cervix and uterus: Secondary | ICD-10-CM | POA: Diagnosis not present

## 2023-08-07 DIAGNOSIS — Z85820 Personal history of malignant melanoma of skin: Secondary | ICD-10-CM | POA: Insufficient documentation

## 2023-08-07 DIAGNOSIS — C439 Malignant melanoma of skin, unspecified: Secondary | ICD-10-CM

## 2023-08-07 LAB — CMP (CANCER CENTER ONLY)
ALT: 15 U/L (ref 0–44)
AST: 14 U/L — ABNORMAL LOW (ref 15–41)
Albumin: 4.3 g/dL (ref 3.5–5.0)
Alkaline Phosphatase: 60 U/L (ref 38–126)
Anion gap: 6 (ref 5–15)
BUN: 12 mg/dL (ref 8–23)
CO2: 28 mmol/L (ref 22–32)
Calcium: 9.5 mg/dL (ref 8.9–10.3)
Chloride: 107 mmol/L (ref 98–111)
Creatinine: 0.56 mg/dL (ref 0.44–1.00)
GFR, Estimated: >60 mL/min (ref >60)
Glucose, Bld: 84 mg/dL (ref 70–99)
Potassium: 4 mmol/L (ref 3.5–5.1)
Sodium: 141 mmol/L (ref 135–145)
Total Bilirubin: 0.3 mg/dL (ref 0.3–1.2)
Total Protein: 6.7 g/dL (ref 6.5–8.1)

## 2023-08-07 LAB — CBC WITH DIFFERENTIAL (CANCER CENTER ONLY)
Abs Immature Granulocytes: 0.01 10*3/uL (ref 0.00–0.07)
Basophils Absolute: 0 10*3/uL (ref 0.0–0.1)
Basophils Relative: 1 %
Eosinophils Absolute: 0.1 10*3/uL (ref 0.0–0.5)
Eosinophils Relative: 2 %
HCT: 40.3 % (ref 36.0–46.0)
Hemoglobin: 13.4 g/dL (ref 12.0–15.0)
Immature Granulocytes: 0 %
Lymphocytes Relative: 29 %
Lymphs Abs: 1.4 10*3/uL (ref 0.7–4.0)
MCH: 28.3 pg (ref 26.0–34.0)
MCHC: 33.3 g/dL (ref 30.0–36.0)
MCV: 85 fL (ref 80.0–100.0)
Monocytes Absolute: 0.4 10*3/uL (ref 0.1–1.0)
Monocytes Relative: 9 %
Neutro Abs: 2.9 10*3/uL (ref 1.7–7.7)
Neutrophils Relative %: 59 %
Platelet Count: 223 10*3/uL (ref 150–400)
RBC: 4.74 MIL/uL (ref 3.87–5.11)
RDW: 13.8 % (ref 11.5–15.5)
WBC Count: 4.8 10*3/uL (ref 4.0–10.5)
nRBC: 0 % (ref 0.0–0.2)

## 2023-08-07 MED ORDER — SODIUM CHLORIDE (PF) 0.9 % IJ SOLN
INTRAMUSCULAR | Status: AC
  Filled 2023-08-07: qty 50

## 2023-08-07 MED ORDER — IOHEXOL 300 MG/ML  SOLN
100.0000 mL | Freq: Once | INTRAMUSCULAR | Status: AC | PRN
  Administered 2023-08-07: 100 mL via INTRAVENOUS

## 2023-08-09 ENCOUNTER — Inpatient Hospital Stay: Payer: BC Managed Care – PPO | Admitting: Internal Medicine

## 2023-08-09 ENCOUNTER — Other Ambulatory Visit: Payer: Self-pay

## 2023-08-09 VITALS — BP 118/80 | HR 69 | Temp 98.0°F | Resp 14 | Ht 65.75 in | Wt 158.4 lb

## 2023-08-09 DIAGNOSIS — C439 Malignant melanoma of skin, unspecified: Secondary | ICD-10-CM

## 2023-08-09 DIAGNOSIS — M79604 Pain in right leg: Secondary | ICD-10-CM | POA: Diagnosis not present

## 2023-08-09 DIAGNOSIS — Z85820 Personal history of malignant melanoma of skin: Secondary | ICD-10-CM | POA: Diagnosis not present

## 2023-08-09 NOTE — Progress Notes (Signed)
[facility] [phone]   [phone]  OFFICE PROGRESS NOTE  [name], MD [address] [place] [address]  DIAGNOSIS: Lower extremity melanoma diagnosed in 2017.  She developed stage IV disease with abdominal adenopathy in May of 2022 with BRAF negative.  1) Initially diagnosed with Superficial spreading melanoma of the lower extremity diagnosed in August 2017. She was found to have 1.25 mm thickness without ulceration or lymphovascular invasion. She had 1 out of 2 lymph nodes positive. The size of the lymph node measuring 0.25 x 0.95 mm with pathological staging T2bN1.  2) Superficial spreading melanoma of the right shoulder diagnosed in August 2017.She is status post wide local excision and found tohave a 0.72 mm. These findings indicate a pathological staging of T1 NA.    PRIOR THERAPY: 1) She is status post local excision followed by sentinel lymph node sampling with 2 lymph nodes sampled one was involved with metastatic melanoma. The measurement was 0.25 x 0.95 mm in dimension of the capsule. Her final pathological stage stage is T2bN1. This was completed on 05/23/2016.   2) Nivolumab total of 240 mg dose every 2 weeks for total of 4 doses. Started on 07/21/2016.  She completed 4 cycles of therapy for a total of 2 months out of planned 12.  Patient opted out continuing immunotherapy.   3) She is status post lymph node biopsy completed on Feb 15, 2021.   4) Ipilimumab 3 mg/kg with a nivolumab at 1 mg/kg started on Mar 04, 2021.  She is status post 4 cycles of therapy with excellent response and minimal residual disease.  5) Nivolumab 480 mg every 4 weeks started on May 27, 2021.  Status post 30 cycles with tentative plan to complete 2 years of therapy in April 2024.   CURRENT THERAPY: Observation.  INTERVAL HISTORY: Sharon Kirk 62 y.o. female returns to the clinic today for follow-up visit accompanied by her  husband.Discussed the use of AI scribe software for clinical note transcription with the patient, who gave verbal consent to proceed.  History of Present Illness   Sharon Kirk, a 62 year old patient with a history of stage four malignant melanoma, presents for a follow-up visit. The patient was diagnosed in May 2022 and underwent treatment with ipilimumab and nivolumab, receiving four cycles of the combination therapy followed by thirty cycles of nivolumab alone, which was completed in April 2024.  The patient reports persistent abdominal swelling, which has been a concern for the past six months. She also expresses dissatisfaction with the state of her hair, which she believes has not returned to normal. No other symptoms such as breathing difficulties, nausea, or vomiting are reported.  However, the patient does mention experiencing pain radiating down the right leg while sitting on the sofa at night. This pain is described as a new symptom, with no prior history of disc disease or sciatica. The patient denies any other symptoms or new medications.  The patient also reports having a lot of digestive problems, but no further details are provided. The patient is due to see a dermatologist for a full skin test in the following week. The patient's treatment appears to have been successful, with no evidence of metastatic disease in the abdomen and pelvis on recent scans.         MEDICAL HISTORY: Past Medical History:  Diagnosis Date   Allergy    Cancer (HCC)    Melanoma  Hyperlipidemia    IBS (irritable bowel syndrome)    Osteopenia    PONV (postoperative nausea and vomiting)    Thyroid nodule     ALLERGIES:  is allergic to codeine.  MEDICATIONS:  Current Outpatient Medications  Medication Sig Dispense Refill   Biotin 1000 MCG CHEW Chew by mouth.     calcium carbonate (OS-CAL) 600 MG TABS Take 600 mg by mouth 2 (two) times daily with a meal.     cholecalciferol (VITAMIN D) 1000  UNITS tablet Take 5,000 Units by mouth daily.     Cyanocobalamin (VITAMIN B-12 CR PO) Take 1 tablet by mouth daily.     ELDERBERRY PO Take by mouth.     loratadine (CLARITIN) 10 MG tablet Take 10 mg by mouth daily.     Multiple Vitamins-Minerals (ZINC PO) Take by mouth.     Omega-3 Fatty Acids (FISH OIL) 1200 MG CAPS Take by mouth.     No current facility-administered medications for this visit.    SURGICAL HISTORY:  Past Surgical History:  Procedure Laterality Date   ABDOMINAL HYSTERECTOMY  1991   DILATION AND CURETTAGE OF UTERUS  1988   IR US GUIDANCE  02/15/2021   MELANOMA EXCISION Right 05/23/2016   Procedure: WIDE  EXCISION RIGHT SHOULDER MELANOMA AND WIDE EXCISION LEFT LOWER LEG MELANOMA;  Surgeon: Avel Peace, MD;  Location: [facility];  Service: General;  Laterality: Right;  right shoulder and left lower leg sites   SENTINEL NODE BIOPSY Left 05/23/2016   Procedure: LEFT INGUINAL SENTINEL LYMPH  NODE BIOPSY;  Surgeon: Avel Peace, MD;  Location: [facility];  Service: General;  Laterality: Left;   THYROID SURGERY  07/2015   NODULE    REVIEW OF SYSTEMS:  Constitutional: negative Eyes: negative Ears, nose, mouth, throat, and face: negative Respiratory: negative Cardiovascular: negative Gastrointestinal: positive for abdominal pain Genitourinary:negative Integument/breast: negative Hematologic/lymphatic: negative Musculoskeletal:negative Neurological: negative Behavioral/Psych: negative Endocrine: negative Allergic/Immunologic: negative   PHYSICAL EXAMINATION: General appearance: alert, cooperative, and no distress Head: Normocephalic, without obvious abnormality, atraumatic Neck: no adenopathy, no JVD, supple, symmetrical, trachea midline, and thyroid not enlarged, symmetric, no tenderness/mass/nodules Lymph nodes: Cervical, supraclavicular, and axillary nodes normal. Resp: clear to auscultation bilaterally Back: symmetric, no curvature.  ROM normal. No CVA tenderness. Cardio: regular rate and rhythm, S1, S2 normal, no murmur, click, rub or gallop GI: soft, non-tender; bowel sounds normal; no masses,  no organomegaly Extremities: extremities normal, atraumatic, no cyanosis or edema Neurologic: Alert and oriented X 3, normal strength and tone. Normal symmetric reflexes. Normal coordination and gait  ECOG PERFORMANCE STATUS: 1 - Symptomatic but completely ambulatory  Blood pressure 118/80, pulse 69, temperature 98 F (36.7 C), temperature source Oral, resp. rate 14, height 5' 5.75" (1.67 m), weight 158 lb 6.4 oz (71.8 kg), last menstrual period 06/30/1990, SpO2 97%.  LABORATORY DATA: Lab Results  Component Value Date   WBC 4.8 08/07/2023   HGB 13.4 08/07/2023   HCT 40.3 08/07/2023   MCV 85.0 08/07/2023   PLT 223 08/07/2023      Chemistry      Component Value Date/Time   NA 141 08/07/2023 0951   NA 140 09/08/2016 0755   K 4.0 08/07/2023 0951   K 3.9 09/08/2016 0755   CL 107 08/07/2023 0951   CO2 28 08/07/2023 0951   CO2 23 09/08/2016 0755   BUN 12 08/07/2023 0951   BUN 11.2 09/08/2016 0755   CREATININE 0.56 08/07/2023 0951   CREATININE 0.61 09/30/2019  1308   CREATININE 0.7 09/08/2016 0755      Component Value Date/Time   CALCIUM 9.5 08/07/2023 0951   CALCIUM 9.1 09/08/2016 0755   ALKPHOS 60 08/07/2023 0951   ALKPHOS 61 09/08/2016 0755   AST 14 (L) 08/07/2023 0951   AST 13 09/08/2016 0755   ALT 15 08/07/2023 0951   ALT 16 09/08/2016 0755   BILITOT 0.3 08/07/2023 0951   BILITOT 0.42 09/08/2016 0755       RADIOGRAPHIC STUDIES: CT Chest W Contrast  Result Date: 08/09/2023 CLINICAL DATA:  Melanoma. Restaging staging exam. * Tracking Code: BO *. Prior melanoma RIGHT shoulder excision and LEFT lower leg excision. history of abdominal adenopathy. EXAM: CT CHEST, ABDOMEN, AND PELVIS WITH CONTRAST TECHNIQUE: Multidetector CT imaging of the chest, abdomen and pelvis was performed following the standard  protocol during bolus administration of intravenous contrast. RADIATION DOSE REDUCTION: This exam was performed according to the departmental dose-optimization program which includes automated exposure control, adjustment of the mA and/or kV according to patient size and/or use of iterative reconstruction technique. CONTRAST:  OMNIPAQUE IOHEXOL 300 MG/ML  SOLN COMPARISON:  PET-CT 02/02/2019 FINDINGS: CT CHEST FINDINGS Cardiovascular: No significant vascular findings. Normal heart size. No pericardial effusion. Mediastinum/Nodes: No axillary or supraclavicular adenopathy. No mediastinal or hilar adenopathy. No pericardial fluid. Esophagus normal. Lungs/Pleura: No suspicious pulmonary nodules. Normal pleural. Airways normal. Musculoskeletal: No aggressive osseous lesion. No cutaneous lesion. CT ABDOMEN AND PELVIS FINDINGS Hepatobiliary: No focal hepatic lesion. No biliary ductal dilatation. Gallbladder is normal. Common bile duct is normal. Pancreas: Pancreas is normal. No ductal dilatation. No pancreatic inflammation. Spleen: 10 mm hypoenhancing lesion in the spleen is unchanged from comparison CT (image 60/2. No hypermetabolic activity on comparison PET scan at this site. Adrenals/urinary tract: Adrenal glands and kidneys are normal. The ureters and bladder normal. Stomach/Bowel: Stomach, small bowel, appendix, and cecum are normal. The colon and rectosigmoid colon are normal. Vascular/Lymphatic: Abdominal aorta is normal caliber. Several partially calcified lymph nodes in the retroperitoneum LEFT adjacent the aorta. For example 15 mm node on image 72/2 compares to 17 mm on comparison CT. No metabolic activity on comparison PET-CT. A similar node measuring 11 mm on image 76 is unchanged no new retroperitoneal adenopathy. LEFT external iliac lymph node on image 112 measures 7 mm unchanged. Reproductive: Post hysterectomy.  Adnexa unremarkable Other: No free fluid. Musculoskeletal: No aggressive osseous lesion.  IMPRESSION: CHEST: No evidence of thoracic metastasis. PELVIS: 1. No evidence of metastatic melanoma in the abdomen pelvis. 2. Stable partially calcified retroperitoneal lymph nodes. Favor treated metastatic melanoma. 3. Stable hypoenhancing lesion in the spleen. Favor benign hemangioma or hamartoma. 4.  Aortic Atherosclerosis (ICD10-I70.0). Electronically Signed   By: Genevive Bi M.D.   On: 08/09/2023 09:22   CT Abdomen Pelvis W Contrast  Result Date: 08/09/2023 CLINICAL DATA:  Melanoma. Restaging staging exam. * Tracking Code: BO *. Prior melanoma RIGHT shoulder excision and LEFT lower leg excision. history of abdominal adenopathy. EXAM: CT CHEST, ABDOMEN, AND PELVIS WITH CONTRAST TECHNIQUE: Multidetector CT imaging of the chest, abdomen and pelvis was performed following the standard protocol during bolus administration of intravenous contrast. RADIATION DOSE REDUCTION: This exam was performed according to the departmental dose-optimization program which includes automated exposure control, adjustment of the mA and/or kV according to patient size and/or use of iterative reconstruction technique. CONTRAST:  OMNIPAQUE IOHEXOL 300 MG/ML  SOLN COMPARISON:  PET-CT 02/02/2019 FINDINGS: CT CHEST FINDINGS Cardiovascular: No significant vascular findings. Normal heart size. No pericardial  effusion. Mediastinum/Nodes: No axillary or supraclavicular adenopathy. No mediastinal or hilar adenopathy. No pericardial fluid. Esophagus normal. Lungs/Pleura: No suspicious pulmonary nodules. Normal pleural. Airways normal. Musculoskeletal: No aggressive osseous lesion. No cutaneous lesion. CT ABDOMEN AND PELVIS FINDINGS Hepatobiliary: No focal hepatic lesion. No biliary ductal dilatation. Gallbladder is normal. Common bile duct is normal. Pancreas: Pancreas is normal. No ductal dilatation. No pancreatic inflammation. Spleen: 10 mm hypoenhancing lesion in the spleen is unchanged from comparison CT (image 60/2. No  hypermetabolic activity on comparison PET scan at this site. Adrenals/urinary tract: Adrenal glands and kidneys are normal. The ureters and bladder normal. Stomach/Bowel: Stomach, small bowel, appendix, and cecum are normal. The colon and rectosigmoid colon are normal. Vascular/Lymphatic: Abdominal aorta is normal caliber. Several partially calcified lymph nodes in the retroperitoneum LEFT adjacent the aorta. For example 15 mm node on image 72/2 compares to 17 mm on comparison CT. No metabolic activity on comparison PET-CT. A similar node measuring 11 mm on image 76 is unchanged no new retroperitoneal adenopathy. LEFT external iliac lymph node on image 112 measures 7 mm unchanged. Reproductive: Post hysterectomy.  Adnexa unremarkable Other: No free fluid. Musculoskeletal: No aggressive osseous lesion. IMPRESSION: CHEST: No evidence of thoracic metastasis. PELVIS: 1. No evidence of metastatic melanoma in the abdomen pelvis. 2. Stable partially calcified retroperitoneal lymph nodes. Favor treated metastatic melanoma. 3. Stable hypoenhancing lesion in the spleen. Favor benign hemangioma or hamartoma. 4.  Aortic Atherosclerosis (ICD10-I70.0). Electronically Signed   By: Genevive Bi M.D.   On: 08/09/2023 09:22    ASSESSMENT AND PLAN: This is a very pleasant 62 years old white female with Lower extremity melanoma diagnosed in 2017.  She developed stage IV disease with abdominal adenopathy in May of 2022 with BRAF negative.  1) Initially diagnosed with Superficial spreading melanoma of the lower extremity diagnosed in August 2017. She was found to have 1.25 mm thickness without ulceration or lymphovascular invasion. She had 1 out of 2 lymph nodes positive. The size of the lymph node measuring 0.25 x 0.95 mm with pathological staging T2bN1.  2) Superficial spreading melanoma of the right shoulder diagnosed in August 2017.She is status post wide local excision and found tohave a 0.72 mm. These findings indicate a  pathological staging of T1 NA.  The patient is status post the following treatment. 1) status post local excision followed by sentinel lymph node sampling with 2 lymph nodes sampled one was involved with metastatic melanoma. The measurement was 0.25 x 0.95 mm in dimension of the capsule. Her final pathological stage stage is T2bN1. This was completed on 05/23/2016.   2) Nivolumab total of 240 mg dose every 2 weeks for total of 4 doses. Started on 07/21/2016.  She completed 4 cycles of therapy for a total of 2 months out of planned 12.  Patient opted out continuing immunotherapy.   3) status post lymph node biopsy completed on Feb 15, 2021.   4) Ipilimumab 3 mg/kg with a nivolumab at 1 mg/kg started on Mar 04, 2021.  She is status post 4 cycles of therapy with excellent response and minimal residual disease.  5) she completed maintenance treatment with nivolumab 480 Mg IV every 4 weeks status post 30 cycles.  The patient has been tolerating this treatment well with no concerning adverse effects.  The patient is currently on observation. She had repeat CT scan of the chest, abdomen and pelvis performed recently.  I personally and independently reviewed the scan images and discussed the result and showed  the images to the patient today. Her scan showed no concerning findings for disease progression.    Stage IV Malignant Melanoma Completed treatment with ipilimumab and nivolumab in April 2024. No evidence of metastatic disease on recent CT scan. Patient reports persistent abdominal swelling and skin pigmentation changes, likely side effects of immune therapy. -Continue surveillance with imaging. -Order PET scan in 6 months (April 2025).  Right Leg Pain New onset, likely sciatica. No history of disc disease. -Advise over-the-counter pain relief with Tylenol or ibuprofen as needed. -Consider consultation with orthopedic surgeon for possible injection therapy if symptoms persist.  General Health  Maintenance -Continue routine dermatology follow-ups.   The patient was advised to call immediately if she has any other concerning symptoms in the interval. The patient voices understanding of current disease status and treatment options and is in agreement with the current care plan.  All questions were answered. The patient knows to call the clinic with any problems, questions or concerns. We can certainly see the patient much sooner if necessary.  The total time spent in the appointment was 30 minutes.  Disclaimer: This note was dictated with voice recognition software. Similar sounding words can inadvertently be transcribed and may not be corrected upon review.

## 2023-08-11 ENCOUNTER — Other Ambulatory Visit: Payer: Self-pay

## 2023-08-14 DIAGNOSIS — D485 Neoplasm of uncertain behavior of skin: Secondary | ICD-10-CM | POA: Diagnosis not present

## 2023-08-14 DIAGNOSIS — D2261 Melanocytic nevi of right upper limb, including shoulder: Secondary | ICD-10-CM | POA: Diagnosis not present

## 2023-08-14 DIAGNOSIS — L80 Vitiligo: Secondary | ICD-10-CM | POA: Diagnosis not present

## 2023-08-14 DIAGNOSIS — L820 Inflamed seborrheic keratosis: Secondary | ICD-10-CM | POA: Diagnosis not present

## 2023-08-14 DIAGNOSIS — Z85820 Personal history of malignant melanoma of skin: Secondary | ICD-10-CM | POA: Diagnosis not present

## 2023-08-14 DIAGNOSIS — D2262 Melanocytic nevi of left upper limb, including shoulder: Secondary | ICD-10-CM | POA: Diagnosis not present

## 2023-08-14 DIAGNOSIS — L570 Actinic keratosis: Secondary | ICD-10-CM | POA: Diagnosis not present

## 2023-08-17 DIAGNOSIS — R7309 Other abnormal glucose: Secondary | ICD-10-CM | POA: Diagnosis not present

## 2023-09-05 ENCOUNTER — Encounter: Payer: Self-pay | Admitting: Internal Medicine

## 2023-09-05 NOTE — Telephone Encounter (Signed)
Telephone call  

## 2023-09-16 DIAGNOSIS — R7309 Other abnormal glucose: Secondary | ICD-10-CM | POA: Diagnosis not present

## 2023-10-12 ENCOUNTER — Encounter: Payer: Self-pay | Admitting: Nurse Practitioner

## 2023-10-15 ENCOUNTER — Ambulatory Visit (INDEPENDENT_AMBULATORY_CARE_PROVIDER_SITE_OTHER): Payer: BC Managed Care – PPO | Admitting: Nurse Practitioner

## 2023-10-15 ENCOUNTER — Ambulatory Visit
Admission: RE | Admit: 2023-10-15 | Discharge: 2023-10-15 | Disposition: A | Discharge status: Home / Self Care | Payer: BC Managed Care – PPO | Source: Ambulatory Visit | Attending: Nurse Practitioner

## 2023-10-15 VITALS — BP 122/82 | HR 71 | Ht 65.25 in | Wt 157.0 lb

## 2023-10-15 DIAGNOSIS — Z01419 Encounter for gynecological examination (general) (routine) without abnormal findings: Secondary | ICD-10-CM

## 2023-10-15 DIAGNOSIS — Z1231 Encounter for screening mammogram for malignant neoplasm of breast: Secondary | ICD-10-CM | POA: Diagnosis not present

## 2023-10-15 DIAGNOSIS — Z780 Asymptomatic menopausal state: Secondary | ICD-10-CM

## 2023-10-15 DIAGNOSIS — M8589 Other specified disorders of bone density and structure, multiple sites: Secondary | ICD-10-CM

## 2023-10-15 NOTE — Progress Notes (Signed)
Sharon Kirk 1960/11/15 [id number]   History:  62 y.o. G0 presents for annual exam. Postmenopausal - Stopped HRT after DVT in 2021 while hospitalized with Covid. H/O stage IV melanoma, completed treatment 01/2023. Continuing surveillance with oncology. Prior 2017 superficial spreading tumor in lower extremity and right shoulder. S/P 1991 TVH. Normal pap and mammogram history. Osteopenia of bilateral hips. Hypothyroidism managed by endocrinology.   Gynecologic History Patient's last menstrual period was 06/30/1990.   Contraception: status post hysterectomy Sexually active: Yes  Health maintenance Last Pap: 07/31/2011. Results were: Normal Last mammogram: 10/15/2023 (today). Results were: No report yet Last colonoscopy: 2020. Results were: Normal, 10-year recall Last Dexa: 12/05/2022. Results were: T-score -2.3, FRAX 12.0% / 2.0%  Past medical history, past surgical history, family history and social history were all reviewed and documented in the EPIC chart. Married. 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   ROS:  A ROS was performed and pertinent positives and negatives are included.  Exam:  Vitals:   10/15/23 0941  BP: 122/82  Pulse: 71  SpO2: 98%  Weight: 157 lb (71.2 kg)  Height: 5' 5.25" (1.657 m)      Body mass index is 25.93 kg/m.  General appearance:  Normal Thyroid:  Symmetrical, normal in size, without palpable masses or nodularity. Respiratory  Auscultation:  Clear without wheezing or rhonchi Cardiovascular  Auscultation:  Regular rate, without rubs, murmurs or gallops  Edema/varicosities:  Not grossly evident Abdominal  Soft,nontender, without masses, guarding or rebound.  Liver/spleen:  No organomegaly noted  Hernia:  None appreciated  Skin  Inspection:  Grossly normal   Breasts: Examined lying and sitting.   Right: Without masses, retractions, discharge or axillary adenopathy.   Left: Without masses, retractions,  discharge or axillary adenopathy. Pelvic: External genitalia:  no lesions              Urethra:  normal appearing urethra with no masses, tenderness or lesions              Bartholins and Skenes: normal                 Vagina: normal appearing vagina with normal color and discharge, no lesions              Cervix: absent Bimanual Exam:  Uterus:  absent              Adnexa: no mass, fullness, tenderness              Rectovaginal: Deferred              Anus:  normal, no lesions  Patient informed chaperone available to be present for breast and pelvic exam. Patient has requested no chaperone to be present. Patient has been advised what will be completed during breast and pelvic exam.   Assessment/Plan:  62 y.o. G0 for annual exam.   Well female exam with routine gynecological exam - Education provided on SBEs, importance of preventative screenings, current guidelines, high calcium diet, regular exercise, and multivitamin daily. Labs with PCP.   Postmenopausal - No HRT. S/P S/P 1991 TVH.  Osteopenia of necks of both femurs - 11/2022 T-score -2.3 without elevated FRAX. Continue Vitamin D + Calcium and regular exercise.   Screening for cervical cancer - Normal pap history.  No longer screening per guidelines.  Screening for breast cancer - Normal mammogram history.  Continue annual screenings. Breast exam normal today.  Screening for colon cancer -  Normal colonoscopy in 2020.  She will repeat at 10-year interval per GI's recommendation.   Return in about 1 year (around 10/14/2024) for Annual.    [name] [facility], 10:26 AM 10/15/2023

## 2024-01-18 ENCOUNTER — Other Ambulatory Visit: Payer: Self-pay

## 2024-01-19 ENCOUNTER — Encounter (HOSPITAL_BASED_OUTPATIENT_CLINIC_OR_DEPARTMENT_OTHER): Payer: Self-pay

## 2024-01-19 ENCOUNTER — Emergency Department (HOSPITAL_BASED_OUTPATIENT_CLINIC_OR_DEPARTMENT_OTHER): Admission: EM | Admit: 2024-01-19 | Discharge: 2024-01-19 | Disposition: A | Discharge status: Home / Self Care

## 2024-01-19 ENCOUNTER — Other Ambulatory Visit: Payer: Self-pay

## 2024-01-19 DIAGNOSIS — R300 Dysuria: Secondary | ICD-10-CM | POA: Diagnosis present

## 2024-01-19 DIAGNOSIS — N390 Urinary tract infection, site not specified: Secondary | ICD-10-CM | POA: Diagnosis not present

## 2024-01-19 LAB — URINALYSIS, ROUTINE W REFLEX MICROSCOPIC
Bilirubin Urine: NEGATIVE
Glucose, UA: NEGATIVE mg/dL
Ketones, ur: NEGATIVE mg/dL
Nitrite: NEGATIVE
Protein, ur: NEGATIVE mg/dL
Specific Gravity, Urine: 1.006 (ref 1.005–1.030)
WBC, UA: >50 WBC/hpf (ref 0–5)
pH: 5.5 (ref 5.0–8.0)

## 2024-01-19 MED ORDER — PHENAZOPYRIDINE HCL 200 MG PO TABS: 200.0000 mg | ORAL_TABLET | Freq: Three times a day (TID) | ORAL | 0 refills | Status: DC

## 2024-01-19 MED ORDER — NITROFURANTOIN MONOHYD MACRO 100 MG PO CAPS: 100.0000 mg | ORAL_CAPSULE | Freq: Two times a day (BID) | ORAL | 0 refills | Status: DC

## 2024-01-19 NOTE — ED Triage Notes (Signed)
 Says this morning ~530 after drinking morning coffee she noticed suprapubic pressure, and painful urination with urgency.   Request eval for UTI

## 2024-01-19 NOTE — Discharge Instructions (Signed)
 Your urine does appear to be infected.  Please take the medications as prescribed and follow-up with your doctor.  Call your doctor or return to the ER for worsening symptoms.

## 2024-01-19 NOTE — ED Provider Notes (Signed)
 [place] [place] EMERGENCY DEPARTMENT AT [facility] Provider Note   CSN: [id number] Arrival date & time: 01/19/24  4098     History  Chief Complaint  Patient presents with   Dysuria    Sharon Kirk is a 63 y.o. female.  63 year old female with past medical history of GERD presenting to the emergency department today with dysuria, increased urinary frequency, and urgency.  The patient denies any dysuria.  She states that this started yesterday.  She denies any associated nausea or vomiting.  Denies any flank pain.  She has been afebrile.  She came to the emergency department today due to the symptoms.  She reports this is similar to symptoms she has had in the past with urinary tract infections but she has not had any issues with this for years.   Dysuria      Home Medications Prior to Admission medications   Medication Sig Start Date End Date Taking? Authorizing Provider  nitrofurantoin, macrocrystal-monohydrate, (MACROBID) 100 MG capsule Take 1 capsule (100 mg total) by mouth 2 (two) times daily. 01/19/24  Yes Durwin Glaze, MD  phenazopyridine (PYRIDIUM) 200 MG tablet Take 1 tablet (200 mg total) by mouth 3 (three) times daily. 01/19/24  Yes Durwin Glaze, MD  Biotin 1000 MCG CHEW Chew by mouth.    [provider]  calcium carbonate (OS-CAL) 600 MG TABS Take 600 mg by mouth 2 (two) times daily with a meal.    [provider]  cholecalciferol (VITAMIN D) 1000 UNITS tablet Take 5,000 Units by mouth daily.    [provider]  Cyanocobalamin (VITAMIN B-12 CR PO) Take 1 tablet by mouth daily.    [provider]  ELDERBERRY PO Take by mouth.    [provider]  loratadine (CLARITIN) 10 MG tablet Take 10 mg by mouth daily.    [provider]  Multiple Vitamins-Minerals (ZINC PO) Take by mouth.    [provider]  Omega-3 Fatty Acids (FISH OIL) 1200 MG CAPS Take by mouth.    [provider]      Allergies     Codeine    Review of Systems   Review of Systems  Genitourinary:  Positive for dysuria.  All other systems reviewed and are negative.   Physical Exam Updated Vital Signs BP 101/72 (BP Location: Right Arm)   Pulse 74   Temp (!) 97.4 F (36.3 C) (Temporal)   Resp 18   Ht 5\' 5"  (1.651 m)   Wt 72.6 kg   LMP 06/30/1990   SpO2 98%   BMI 26.63 kg/m  Physical Exam Vitals and nursing note reviewed.   Gen: NAD Neck: trachea midline Resp: clear to auscultation bilaterally Card: RRR, no murmurs, rubs, or gallops Abd: nontender, nondistended, no CVA tenderness Extremities: no calf tenderness, no edema Vascular: 2+ radial pulses bilaterally, 2+ DP pulses bilaterally Skin: no rashes Psyc: acting appropriately   ED Results / Procedures / Treatments   Labs (all labs ordered are listed, but only abnormal results are displayed) Labs Reviewed  URINALYSIS, ROUTINE W REFLEX MICROSCOPIC - Abnormal; Notable for the following components:      Result Value   Hgb urine dipstick SMALL (*)    Leukocytes,Ua LARGE (*)    Bacteria, UA FEW (*)    All other components within normal limits    EKG None  Radiology No results found.  Procedures Procedures    Medications Ordered in ED Medications - No data to display  ED  Course/ Medical Decision Making/ A&P                                 Medical Decision Making 63 year old female with past medical history of GERD presenting to the emergency department today with symptoms consistent with urinary tract infection.  Urinalysis performed in triage does show findings consistent with this.  She does not have any findings on exam to suggest pyelonephritis.  Vital signs are reassuring and low suspicion for sepsis at this time.  I think that she may be treated with oral medications at discharge.  Amount and/or Complexity of Data Reviewed Labs: ordered.           Final Clinical Impression(s) / ED Diagnoses Final diagnoses:  Urinary  tract infection without hematuria, site unspecified    Rx / DC Orders ED Discharge Orders          Ordered    nitrofurantoin, macrocrystal-monohydrate, (MACROBID) 100 MG capsule  2 times daily        01/19/24 0727    phenazopyridine (PYRIDIUM) 200 MG tablet  3 times daily        01/19/24 0727              Durwin Glaze, MD 01/19/24 [phone]

## 2024-01-26 ENCOUNTER — Emergency Department (HOSPITAL_BASED_OUTPATIENT_CLINIC_OR_DEPARTMENT_OTHER): Admission: EM | Admit: 2024-01-26 | Discharge: 2024-01-26 | Disposition: A | Discharge status: Home / Self Care

## 2024-01-26 ENCOUNTER — Encounter (HOSPITAL_BASED_OUTPATIENT_CLINIC_OR_DEPARTMENT_OTHER): Payer: Self-pay | Admitting: Emergency Medicine

## 2024-01-26 ENCOUNTER — Emergency Department (HOSPITAL_BASED_OUTPATIENT_CLINIC_OR_DEPARTMENT_OTHER): Admitting: Radiology

## 2024-01-26 ENCOUNTER — Other Ambulatory Visit: Payer: Self-pay

## 2024-01-26 DIAGNOSIS — J168 Pneumonia due to other specified infectious organisms: Secondary | ICD-10-CM | POA: Diagnosis not present

## 2024-01-26 DIAGNOSIS — R059 Cough, unspecified: Secondary | ICD-10-CM | POA: Diagnosis present

## 2024-01-26 DIAGNOSIS — J189 Pneumonia, unspecified organism: Secondary | ICD-10-CM

## 2024-01-26 LAB — RESP PANEL BY RT-PCR (RSV, FLU A&B, COVID)  RVPGX2
Influenza A by PCR: NEGATIVE
Influenza B by PCR: NEGATIVE
Resp Syncytial Virus by PCR: NEGATIVE
SARS Coronavirus 2 by RT PCR: NEGATIVE

## 2024-01-26 MED ORDER — ALBUTEROL SULFATE HFA 108 (90 BASE) MCG/ACT IN AERS: 1.0000 | INHALATION_SPRAY | Freq: Four times a day (QID) | RESPIRATORY_TRACT | 0 refills | Status: DC | PRN

## 2024-01-26 MED ORDER — AMOXICILLIN-POT CLAVULANATE 875-125 MG PO TABS: 1.0000 | ORAL_TABLET | Freq: Two times a day (BID) | ORAL | 0 refills | Status: DC

## 2024-01-26 MED ORDER — AZITHROMYCIN 250 MG PO TABS: 250.0000 mg | ORAL_TABLET | Freq: Every day | ORAL | 0 refills | Status: DC

## 2024-01-26 MED ORDER — BENZONATATE 100 MG PO CAPS: 100.0000 mg | ORAL_CAPSULE | Freq: Three times a day (TID) | ORAL | 0 refills | Status: DC

## 2024-01-26 MED ORDER — AMOXICILLIN-POT CLAVULANATE 875-125 MG PO TABS
1.0000 | ORAL_TABLET | Freq: Once | ORAL | Status: AC
  Administered 2024-01-26: 1 via ORAL
  Filled 2024-01-26: qty 1

## 2024-01-26 NOTE — ED Triage Notes (Signed)
 Cough,sneezing, taking claritin. She did televisit, ordered steroid and tessalon pearls,started on Wednesday. Cough has gotten worse ,congestion a little better. No fevers

## 2024-01-26 NOTE — ED Provider Notes (Signed)
 [place] EMERGENCY DEPARTMENT AT [facility] Provider Note   CSN: [id number] Arrival date & time: 01/26/24  1644     History  No chief complaint on file.   Sharon Kirk is a 63 y.o. female.  63 year old female with past medical history of GERD presenting to the emergency department today with cough.  The patient states that last week she saw her doctor for some nasal congestion and cough.  She states that the nasal congestion has improved with allergy medications but her cough is gotten worse.  She reports that she has had a minimally productive cough during this time.  Denies any associated chest pain.  She states that when she has coughing fits occasionally she will feel short of breath but denies any shortness of breath with exertion.  She denies any leg pain or swelling.  Denies a history of DVT or pulmonary embolism, recent surgeries, recent travel.  She came to the ER today due to these worsening symptoms.        Home Medications Prior to Admission medications   Medication Sig Start Date End Date Taking? Authorizing Provider  albuterol (VENTOLIN HFA) 108 (90 Base) MCG/ACT inhaler Inhale 1-2 puffs into the lungs every 6 (six) hours as needed for wheezing or shortness of breath. 01/26/24  Yes Carin Charleston, MD  amoxicillin-clavulanate (AUGMENTIN) 875-125 MG tablet Take 1 tablet by mouth every 12 (twelve) hours. 01/26/24  Yes Carin Charleston, MD  benzonatate (TESSALON) 100 MG capsule Take 1 capsule (100 mg total) by mouth every 8 (eight) hours. 01/26/24  Yes Carin Charleston, MD  Biotin 1000 MCG CHEW Chew by mouth.    [provider]  calcium carbonate (OS-CAL) 600 MG TABS Take 600 mg by mouth 2 (two) times daily with a meal.    [provider]  cholecalciferol (VITAMIN D) 1000 UNITS tablet Take 5,000 Units by mouth daily.    [provider]  Cyanocobalamin (VITAMIN B-12 CR PO) Take 1 tablet by mouth daily.    [provider]  ELDERBERRY PO  Take by mouth.    [provider]  loratadine (CLARITIN) 10 MG tablet Take 10 mg by mouth daily.    [provider]  Multiple Vitamins-Minerals (ZINC PO) Take by mouth.    [provider]  nitrofurantoin, macrocrystal-monohydrate, (MACROBID) 100 MG capsule Take 1 capsule (100 mg total) by mouth 2 (two) times daily. 01/19/24   Carin Charleston, MD  Omega-3 Fatty Acids (FISH OIL) 1200 MG CAPS Take by mouth.    [provider]  phenazopyridine (PYRIDIUM) 200 MG tablet Take 1 tablet (200 mg total) by mouth 3 (three) times daily. 01/19/24   Carin Charleston, MD      Allergies    Codeine    Review of Systems   Review of Systems  Respiratory:  Positive for cough.   All other systems reviewed and are negative.   Physical Exam Updated Vital Signs BP 125/81   Pulse 72   Temp 98.1 F (36.7 C)   Resp 15   Wt 72.6 kg   LMP 06/30/1990   SpO2 96%   BMI 26.63 kg/m  Physical Exam Vitals and nursing note reviewed.   Gen: NAD Eyes: PERRL, EOMI HEENT: no oropharyngeal swelling Neck: trachea midline Resp: Rhonchi and left lower lung field Card: RRR, no murmurs, rubs, or gallops Abd: nontender, nondistended Extremities: no calf tenderness, no edema Vascular: 2+ radial pulses bilaterally, 2+ DP pulses bilaterally Skin: no rashes  Psyc: acting appropriately   ED Results / Procedures / Treatments   Labs (all labs ordered are listed, but only abnormal results are displayed) Labs Reviewed  RESP PANEL BY RT-PCR (RSV, FLU A&B, COVID)  [id number]    EKG None  Radiology DG Chest 2 View Result Date: 01/26/2024 CLINICAL DATA:  Cough EXAM: CHEST - 2 VIEW COMPARISON:  08/17/2020 FINDINGS: Left retrocardiac patchy infiltrate, partially obscuring the left diaphragmatic leaflet. Right lung clear. Heart size and mediastinal contours are within normal limits. No effusion. Mild dextroscoliosis of the thoracic spine without underlying vertebral anomaly. IMPRESSION: Left  retrocardiac infiltrate. Electronically Signed   By: Nicoletta Barrier M.D.   On: 01/26/2024 17:42    Procedures Procedures    Medications Ordered in ED Medications  amoxicillin-clavulanate (AUGMENTIN) 875-125 MG per tablet 1 tablet (has no administration in time range)    ED Course/ Medical Decision Making/ A&P                                 Medical Decision Making 63 year old female with past medical history of GERD presenting to the emergency department today with cough.  The patient had viral testing to evaluate for viral etiology as well as a chest x-ray at triage.  Chest x-ray does show left-sided infiltrate.  The certainly does fit with her clinical picture.  The patient's pulse ox on evaluation with me is 98 to 99% on room air.  She is denying any significant shortness of breath here.  I think the patient is appropriate for oral antibiotic therapy.  Will give her Augmentin here.  I think that she is stable for discharge with return precautions.  Amount and/or Complexity of Data Reviewed Radiology: ordered.  Risk Prescription drug management.           Final Clinical Impression(s) / ED Diagnoses Final diagnoses:  Pneumonia of left lung due to infectious organism, unspecified part of lung    Rx / DC Orders ED Discharge Orders          Ordered    amoxicillin-clavulanate (AUGMENTIN) 875-125 MG tablet  Every 12 hours        01/26/24 2138    benzonatate (TESSALON) 100 MG capsule  Every 8 hours        01/26/24 2138    albuterol (VENTOLIN HFA) 108 (90 Base) MCG/ACT inhaler  Every 6 hours PRN        01/26/24 2138              Carin Charleston, MD 01/26/24 2138

## 2024-01-26 NOTE — Discharge Instructions (Signed)
 Please take the Augmentin as prescribed and follow-up with your doctor.  Return to the ER for worsening symptoms.

## 2024-01-28 ENCOUNTER — Other Ambulatory Visit (HOSPITAL_COMMUNITY)

## 2024-01-28 ENCOUNTER — Other Ambulatory Visit: Payer: BC Managed Care – PPO

## 2024-02-04 ENCOUNTER — Ambulatory Visit: Payer: BC Managed Care – PPO | Admitting: Physician Assistant

## 2024-02-11 ENCOUNTER — Inpatient Hospital Stay

## 2024-02-11 ENCOUNTER — Encounter (HOSPITAL_COMMUNITY)
Admission: RE | Admit: 2024-02-11 | Discharge: 2024-02-11 | Disposition: A | Discharge status: Home / Self Care | Source: Ambulatory Visit | Attending: Internal Medicine | Admitting: Internal Medicine

## 2024-02-11 DIAGNOSIS — C4361 Malignant melanoma of right upper limb, including shoulder: Secondary | ICD-10-CM | POA: Insufficient documentation

## 2024-02-11 DIAGNOSIS — E041 Nontoxic single thyroid nodule: Secondary | ICD-10-CM | POA: Diagnosis not present

## 2024-02-11 DIAGNOSIS — C4372 Malignant melanoma of left lower limb, including hip: Secondary | ICD-10-CM | POA: Diagnosis not present

## 2024-02-11 DIAGNOSIS — R590 Localized enlarged lymph nodes: Secondary | ICD-10-CM | POA: Insufficient documentation

## 2024-02-11 DIAGNOSIS — Z85820 Personal history of malignant melanoma of skin: Secondary | ICD-10-CM | POA: Insufficient documentation

## 2024-02-11 DIAGNOSIS — C439 Malignant melanoma of skin, unspecified: Secondary | ICD-10-CM

## 2024-02-11 LAB — CBC WITH DIFFERENTIAL (CANCER CENTER ONLY)
Abs Immature Granulocytes: 0 10*3/uL (ref 0.00–0.07)
Basophils Absolute: 0 10*3/uL (ref 0.0–0.1)
Basophils Relative: 1 %
Eosinophils Absolute: 0.1 10*3/uL (ref 0.0–0.5)
Eosinophils Relative: 2 %
HCT: 41.2 % (ref 36.0–46.0)
Hemoglobin: 13.8 g/dL (ref 12.0–15.0)
Immature Granulocytes: 0 %
Lymphocytes Relative: 30 %
Lymphs Abs: 1.2 10*3/uL (ref 0.7–4.0)
MCH: 28.5 pg (ref 26.0–34.0)
MCHC: 33.5 g/dL (ref 30.0–36.0)
MCV: 85.1 fL (ref 80.0–100.0)
Monocytes Absolute: 0.3 10*3/uL (ref 0.1–1.0)
Monocytes Relative: 8 %
Neutro Abs: 2.4 10*3/uL (ref 1.7–7.7)
Neutrophils Relative %: 59 %
Platelet Count: 200 10*3/uL (ref 150–400)
RBC: 4.84 MIL/uL (ref 3.87–5.11)
RDW: 13.7 % (ref 11.5–15.5)
WBC Count: 4 10*3/uL (ref 4.0–10.5)
nRBC: 0 % (ref 0.0–0.2)

## 2024-02-11 LAB — CMP (CANCER CENTER ONLY)
ALT: 24 U/L (ref 0–44)
AST: 20 U/L (ref 15–41)
Albumin: 4.5 g/dL (ref 3.5–5.0)
Alkaline Phosphatase: 52 U/L (ref 38–126)
Anion gap: 5 (ref 5–15)
BUN: 9 mg/dL (ref 8–23)
CO2: 30 mmol/L (ref 22–32)
Calcium: 9.4 mg/dL (ref 8.9–10.3)
Chloride: 105 mmol/L (ref 98–111)
Creatinine: 0.6 mg/dL (ref 0.44–1.00)
GFR, Estimated: >60 mL/min (ref >60)
Glucose, Bld: 91 mg/dL (ref 70–99)
Potassium: 4 mmol/L (ref 3.5–5.1)
Sodium: 140 mmol/L (ref 135–145)
Total Bilirubin: 0.5 mg/dL (ref 0.0–1.2)
Total Protein: 6.9 g/dL (ref 6.5–8.1)

## 2024-02-11 LAB — GLUCOSE, CAPILLARY: Glucose-Capillary: 103 mg/dL — ABNORMAL HIGH (ref 70–99)

## 2024-02-11 LAB — LACTATE DEHYDROGENASE: LDH: 129 U/L (ref 98–192)

## 2024-02-11 MED ORDER — FLUDEOXYGLUCOSE F - 18 (FDG) INJECTION
8.0100 | Freq: Once | INTRAVENOUS | Status: AC | PRN
Start: 2024-02-11 — End: 2024-02-11
  Administered 2024-02-11: 8.01 via INTRAVENOUS

## 2024-02-18 ENCOUNTER — Inpatient Hospital Stay

## 2024-02-18 ENCOUNTER — Other Ambulatory Visit (HOSPITAL_COMMUNITY)

## 2024-02-28 ENCOUNTER — Inpatient Hospital Stay: Attending: Internal Medicine | Admitting: Internal Medicine

## 2024-02-28 VITALS — BP 112/81 | HR 62 | Temp 97.7°F | Resp 16 | Ht 65.0 in | Wt 155.3 lb

## 2024-02-28 DIAGNOSIS — Z9221 Personal history of antineoplastic chemotherapy: Secondary | ICD-10-CM | POA: Insufficient documentation

## 2024-02-28 DIAGNOSIS — Z85820 Personal history of malignant melanoma of skin: Secondary | ICD-10-CM | POA: Insufficient documentation

## 2024-02-28 DIAGNOSIS — C3490 Malignant neoplasm of unspecified part of unspecified bronchus or lung: Secondary | ICD-10-CM | POA: Diagnosis not present

## 2024-02-28 DIAGNOSIS — E041 Nontoxic single thyroid nodule: Secondary | ICD-10-CM | POA: Diagnosis not present

## 2024-02-28 NOTE — Progress Notes (Signed)
 [facility] [phone]   [phone]  OFFICE PROGRESS NOTE  Sharon Ina, MD [address] [place] [address]  DIAGNOSIS: Lower extremity melanoma diagnosed in 2017.  She developed stage IV disease with abdominal adenopathy in May of 2022 with BRAF negative.  1) Initially diagnosed with Superficial spreading melanoma of the lower extremity diagnosed in August 2017. She was found to have 1.25 mm thickness without ulceration or lymphovascular invasion. She had 1 out of 2 lymph nodes positive. The size of the lymph node measuring 0.25 x 0.95 mm with pathological staging T2bN1.  2) Superficial spreading melanoma of the right shoulder diagnosed in August 2017.She is status post wide local excision and found tohave a 0.72 mm. These findings indicate a pathological staging of T1 NA.    PRIOR THERAPY: 1) She is status post local excision followed by sentinel lymph node sampling with 2 lymph nodes sampled one was involved with metastatic melanoma. The measurement was 0.25 x 0.95 mm in dimension of the capsule. Her final pathological stage stage is T2bN1. This was completed on 05/23/2016.   2) Nivolumab  total of 240 mg dose every 2 weeks for total of 4 doses. Started on 07/21/2016.  She completed 4 cycles of therapy for a total of 2 months out of planned 12.  Patient opted out continuing immunotherapy.   3) She is status post lymph node biopsy completed on Feb 15, 2021.   4) Ipilimumab  3 mg/kg with a nivolumab  at 1 mg/kg started on Mar 04, 2021.  She is status post 4 cycles of therapy with excellent response and minimal residual disease.  5) Nivolumab  480 mg every 4 weeks started on May 27, 2021.  Status post 30 cycles with tentative plan to complete 2 years of therapy in April 2024.   CURRENT THERAPY: Observation.  INTERVAL HISTORY: Sharon Kirk 63 y.o. female returns to the clinic today for follow-up visit accompanied by her  husband.Discussed the use of AI scribe software for clinical note transcription with the patient, who gave verbal consent to proceed.  History of Present Illness   Sharon Kirk is a 63 year old female with stage four melanoma who presents for evaluation and repeat PET scan for restaging of her disease.  Diagnosed with lower extremity melanoma in 2017, which progressed to stage four with abdominal adenopathy in May 2022. The tumor is negative for BRAF mutation. Underwent treatment with immunotherapy, receiving ipilimumab  and nivolumab  every three weeks for four cycles, followed by maintenance nivolumab  for two years, completed in April 2024. Currently under observation.  No new complaints since the last visit six months ago. Desires hair to return to normal. Recent whole body PET scan showed no areas of developing abnormal radiotracer uptake. Blood work, including glucose levels, was normal, with a fasting blood glucose of 91 mg/dL.  Inquired about thyroid  nodules previously noted, identified as a heterogeneous left thyroid  nodule without abnormal uptake. No personal history of diabetes, although two brothers have diabetes.        MEDICAL HISTORY: Past Medical History:  Diagnosis Date   Allergy    Cancer (HCC)    Melanoma   Hyperlipidemia    IBS (irritable bowel syndrome)    Osteopenia    PONV (postoperative nausea and vomiting)    Thyroid  nodule     ALLERGIES:  is allergic to codeine.  MEDICATIONS:  Current Outpatient Medications  Medication Sig Dispense Refill   albuterol  (VENTOLIN   HFA) 108 (90 Base) MCG/ACT inhaler Inhale 1-2 puffs into the lungs every 6 (six) hours as needed for wheezing or shortness of breath. 1 each 0   amoxicillin -clavulanate (AUGMENTIN ) 875-125 MG tablet Take 1 tablet by mouth every 12 (twelve) hours. 14 tablet 0   azithromycin  (ZITHROMAX ) 250 MG tablet Take 1 tablet (250 mg total) by mouth daily. Take first 2 tablets together, then 1 every day until  finished. 6 tablet 0   benzonatate  (TESSALON ) 100 MG capsule Take 1 capsule (100 mg total) by mouth every 8 (eight) hours. 21 capsule 0   Biotin 1000 MCG CHEW Chew by mouth.     calcium carbonate (OS-CAL) 600 MG TABS Take 600 mg by mouth 2 (two) times daily with a meal.     cholecalciferol (VITAMIN D ) 1000 UNITS tablet Take 5,000 Units by mouth daily.     Cyanocobalamin  (VITAMIN B-12 CR PO) Take 1 tablet by mouth daily.     ELDERBERRY PO Take by mouth.     loratadine (CLARITIN) 10 MG tablet Take 10 mg by mouth daily.     Multiple Vitamins-Minerals (ZINC PO) Take by mouth.     Omega-3 Fatty Acids (FISH OIL) 1200 MG CAPS Take by mouth.     phenazopyridine  (PYRIDIUM ) 200 MG tablet Take 1 tablet (200 mg total) by mouth 3 (three) times daily. 6 tablet 0   No current facility-administered medications for this visit.    SURGICAL HISTORY:  Past Surgical History:  Procedure Laterality Date   ABDOMINAL HYSTERECTOMY  1991   DILATION AND CURETTAGE OF UTERUS  1988   IR US  GUIDANCE  02/15/2021   MELANOMA EXCISION Right 05/23/2016   Procedure: WIDE  EXCISION RIGHT SHOULDER MELANOMA AND WIDE EXCISION LEFT LOWER LEG MELANOMA;  Surgeon: Adalberto Hollow, MD;  Location: [facility];  Service: General;  Laterality: Right;  right shoulder and left lower leg sites   SENTINEL NODE BIOPSY Left 05/23/2016   Procedure: LEFT INGUINAL SENTINEL LYMPH  NODE BIOPSY;  Surgeon: Adalberto Hollow, MD;  Location: [facility];  Service: General;  Laterality: Left;   THYROID  SURGERY  07/2015   NODULE    REVIEW OF SYSTEMS:  Constitutional: negative Eyes: negative Ears, nose, mouth, throat, and face: negative Respiratory: negative Cardiovascular: negative Gastrointestinal: negative Genitourinary:negative Integument/breast: negative Hematologic/lymphatic: negative Musculoskeletal:negative Neurological: negative Behavioral/Psych: negative Endocrine: negative Allergic/Immunologic: negative    PHYSICAL EXAMINATION: General appearance: alert, cooperative, and no distress Head: Normocephalic, without obvious abnormality, atraumatic Neck: no adenopathy, no JVD, supple, symmetrical, trachea midline, and thyroid  not enlarged, symmetric, no tenderness/mass/nodules Lymph nodes: Cervical, supraclavicular, and axillary nodes normal. Resp: clear to auscultation bilaterally Back: symmetric, no curvature. ROM normal. No CVA tenderness. Cardio: regular rate and rhythm, S1, S2 normal, no murmur, click, rub or gallop GI: soft, non-tender; bowel sounds normal; no masses,  no organomegaly Extremities: extremities normal, atraumatic, no cyanosis or edema Neurologic: Alert and oriented X 3, normal strength and tone. Normal symmetric reflexes. Normal coordination and gait  ECOG PERFORMANCE STATUS: 1 - Symptomatic but completely ambulatory  Blood pressure 112/81, pulse 62, temperature 97.7 F (36.5 C), temperature source Temporal, resp. rate 16, height 5\' 5"  (1.651 m), weight 155 lb 4.8 oz (70.4 kg), last menstrual period 06/30/1990, SpO2 99%.  LABORATORY DATA: Lab Results  Component Value Date   WBC 4.0 02/11/2024   HGB 13.8 02/11/2024   HCT 41.2 02/11/2024   MCV 85.1 02/11/2024   PLT 200 02/11/2024      Chemistry  Component Value Date/Time   NA 140 02/11/2024 0747   NA 140 09/08/2016 0755   K 4.0 02/11/2024 0747   K 3.9 09/08/2016 0755   CL 105 02/11/2024 0747   CO2 30 02/11/2024 0747   CO2 23 09/08/2016 0755   BUN 9 02/11/2024 0747   BUN 11.2 09/08/2016 0755   CREATININE 0.60 02/11/2024 0747   CREATININE 0.61 09/30/2019 0839   CREATININE 0.7 09/08/2016 0755      Component Value Date/Time   CALCIUM 9.4 02/11/2024 0747   CALCIUM 9.1 09/08/2016 0755   ALKPHOS 52 02/11/2024 0747   ALKPHOS 61 09/08/2016 0755   AST 20 02/11/2024 0747   AST 13 09/08/2016 0755   ALT 24 02/11/2024 0747   ALT 16 09/08/2016 0755   BILITOT 0.5 02/11/2024 0747   BILITOT 0.42 09/08/2016 0755        RADIOGRAPHIC STUDIES: NM PET Image Restage (PS) Whole Body Result Date: 02/13/2024 CLINICAL DATA:  Subsequent treatment strategy for melanoma. No current therapy. Primary sites were right shoulder and lower left leg. EXAM: NUCLEAR MEDICINE PET WHOLE BODY TECHNIQUE: 8.01 mCi F-18 FDG was injected intravenously. Full-ring PET imaging was performed from the head to foot after the radiotracer. CT data was obtained and used for attenuation correction and anatomic localization. Fasting blood glucose: 103 mg/dl COMPARISON:  CT 16/07/9603. PET-CT 02/02/2023. Older exams as well. FINDINGS: Mediastinal blood pool activity: SUV max 3.1 HEAD/NECK: No specific abnormal radiotracer uptake seen including along the lymph node change the neck of the submandibular, posterior triangle or internal jugular region. Near symmetric uptake of the intracranial compartment. Incidental CT findings: The parotid glands, submandibular glands unremarkable. There is enlarged left thyroid  lobe with some calcifications. Again no abnormal uptake. Appearance is also similar to previous examination in April 2024. Streak artifact related to the patient's dental hardware. There is some fluid along the maxillary sinuses. Well pneumatized frontal paranasal sinuses. The mastoid air cells are clear. CHEST: No specific abnormal radiotracer uptake seen above mediastinal blood pool in the axillary regions, hilum or mediastinum. No abnormal uptake along the lung parenchyma. Incidental CT findings: Heart is slightly enlarged. No pericardial effusion. Coronary artery calcifications are seen. Pulsation artifact along the ascending aorta. Breathing motion seen. There is some dependent atelectasis. No consolidation, pneumothorax or effusion. ABDOMEN/PELVIS: There is physiologic distribution radiotracer along the parenchymal organs, bowel and renal collecting systems. Incidental CT findings: Grossly, the liver, adrenal glands, pancreas are unremarkable.  Gallbladder is present. Low-attenuation lesion along the superolateral aspect of the spleen is stable on image 126. Again no abnormal uptake. This is been previously described as possible benign splenic lesion. Scattered vascular calcifications. Once again there are calcified retroperitoneal lymph nodes. Differential includes the sequela of previously treated disease, old infectious or inflammatory process. Contracted urinary bladder. There is a small bubble of air along the margin of the bladder. Please correlate for any prior instrumentation or other process. Grossly the bowel is nondilated. Scattered colonic stool. No free air or free fluid. SKELETON/extremities: Physiologic muscular uptake along the upper extremities, left greater than right. No specific abnormal uptake along the osseous structures. No pathologic soft tissue uptake identified at this time. Mild degenerative changes along the spine. Curvature of the spine. IMPRESSION: No areas of developing abnormal radiotracer uptake to suggest recurrent or metastatic disease. Stable calcified retroperitoneal lymph nodes. Stable heterogeneous left thyroid  nodule without abnormal uptake. Electronically Signed   By: Adrianna Horde M.D.   On: 02/13/2024 11:23    ASSESSMENT AND PLAN:  This is a very pleasant 63 years old white female with Lower extremity melanoma diagnosed in 2017.  She developed stage IV disease with abdominal adenopathy in May of 2022 with BRAF negative.  1) Initially diagnosed with Superficial spreading melanoma of the lower extremity diagnosed in August 2017. She was found to have 1.25 mm thickness without ulceration or lymphovascular invasion. She had 1 out of 2 lymph nodes positive. The size of the lymph node measuring 0.25 x 0.95 mm with pathological staging T2bN1.  2) Superficial spreading melanoma of the right shoulder diagnosed in August 2017.She is status post wide local excision and found tohave a 0.72 mm. These findings indicate a  pathological staging of T1 NA.  The patient is status post the following treatment. 1) status post local excision followed by sentinel lymph node sampling with 2 lymph nodes sampled one was involved with metastatic melanoma. The measurement was 0.25 x 0.95 mm in dimension of the capsule. Her final pathological stage stage is T2bN1. This was completed on 05/23/2016.   2) Nivolumab  total of 240 mg dose every 2 weeks for total of 4 doses. Started on 07/21/2016.  She completed 4 cycles of therapy for a total of 2 months out of planned 12.  Patient opted out continuing immunotherapy.   3) status post lymph node biopsy completed on Feb 15, 2021.   4) Ipilimumab  3 mg/kg with a nivolumab  at 1 mg/kg started on Mar 04, 2021.  She is status post 4 cycles of therapy with excellent response and minimal residual disease.  5) she completed maintenance treatment with nivolumab  480 Mg IV every 4 weeks status post 30 cycles.  The patient has been tolerating this treatment well with no concerning adverse effects.  The patient is currently on observation. She had repeat whole-body PET scan performed recently.  I personally and independently reviewed the scan and discussed the result with the patient and her husband. Her scan showed no concerning findings for disease recurrence or metastasis.    Stage IV melanoma with abdominal adenopathy Stage IV melanoma with abdominal adenopathy, diagnosed in May 2022. Negative BRAF mutation. Previously treated with ipilimumab  and nivolumab , followed by maintenance nivolumab  until April 2024. Currently under observation. Recent PET scan shows no abnormal radiotracer uptake, indicating no recurrent or metastatic disease. Blood work is within normal limits. - Schedule follow-up in six months with regular CT scan - Perform lab work and scan a week or ten days before the next appointment  Thyroid  nodule Heterogeneous left thyroid  nodule identified on imaging. No abnormal radiotracer  uptake, indicating no current concern for malignancy. Common finding and not currently active.   The patient was advised to call immediately if she has any concerning symptoms in the interval. The patient voices understanding of current disease status and treatment options and is in agreement with the current care plan.  All questions were answered. The patient knows to call the clinic with any problems, questions or concerns. We can certainly see the patient much sooner if necessary.  The total time spent in the appointment was 30 minutes.  Disclaimer: This note was dictated with voice recognition software. Similar sounding words can inadvertently be transcribed and may not be corrected upon review.

## 2024-03-01 ENCOUNTER — Other Ambulatory Visit: Payer: Self-pay

## 2024-06-23 ENCOUNTER — Telehealth: Payer: Self-pay | Admitting: Medical Oncology

## 2024-06-23 NOTE — Telephone Encounter (Signed)
 Swelling on my back at R shoulder blade. It may be a pulled muscle.   Dr. Sherrod needs to look at it.  Pt currently on observation.  Per Dr Sherrod I told pt to see her PCP first.

## 2024-08-07 ENCOUNTER — Other Ambulatory Visit: Payer: Self-pay | Admitting: Nurse Practitioner

## 2024-08-07 DIAGNOSIS — Z1231 Encounter for screening mammogram for malignant neoplasm of breast: Secondary | ICD-10-CM

## 2024-08-08 ENCOUNTER — Other Ambulatory Visit: Payer: Self-pay

## 2024-08-13 ENCOUNTER — Encounter: Payer: Self-pay | Admitting: Internal Medicine

## 2024-08-18 ENCOUNTER — Inpatient Hospital Stay: Attending: Internal Medicine

## 2024-08-18 ENCOUNTER — Encounter (HOSPITAL_COMMUNITY): Payer: Self-pay

## 2024-08-18 ENCOUNTER — Ambulatory Visit (HOSPITAL_COMMUNITY)
Admission: RE | Admit: 2024-08-18 | Discharge: 2024-08-18 | Disposition: A | Discharge status: Home / Self Care | Source: Ambulatory Visit | Attending: Internal Medicine | Admitting: Internal Medicine

## 2024-08-18 DIAGNOSIS — Z85820 Personal history of malignant melanoma of skin: Secondary | ICD-10-CM | POA: Insufficient documentation

## 2024-08-18 DIAGNOSIS — Z08 Encounter for follow-up examination after completed treatment for malignant neoplasm: Secondary | ICD-10-CM | POA: Diagnosis present

## 2024-08-18 DIAGNOSIS — C3490 Malignant neoplasm of unspecified part of unspecified bronchus or lung: Secondary | ICD-10-CM | POA: Insufficient documentation

## 2024-08-18 DIAGNOSIS — M8580 Other specified disorders of bone density and structure, unspecified site: Secondary | ICD-10-CM | POA: Diagnosis not present

## 2024-08-18 DIAGNOSIS — R590 Localized enlarged lymph nodes: Secondary | ICD-10-CM | POA: Diagnosis not present

## 2024-08-18 LAB — CBC WITH DIFFERENTIAL (CANCER CENTER ONLY)
Abs Immature Granulocytes: 0 K/uL (ref 0.00–0.07)
Basophils Absolute: 0 K/uL (ref 0.0–0.1)
Basophils Relative: 1 %
Eosinophils Absolute: 0.1 K/uL (ref 0.0–0.5)
Eosinophils Relative: 2 %
HCT: 41.2 % (ref 36.0–46.0)
Hemoglobin: 14.1 g/dL (ref 12.0–15.0)
Immature Granulocytes: 0 %
Lymphocytes Relative: 35 %
Lymphs Abs: 1.4 K/uL (ref 0.7–4.0)
MCH: 28.6 pg (ref 26.0–34.0)
MCHC: 34.2 g/dL (ref 30.0–36.0)
MCV: 83.6 fL (ref 80.0–100.0)
Monocytes Absolute: 0.3 K/uL (ref 0.1–1.0)
Monocytes Relative: 8 %
Neutro Abs: 2.2 K/uL (ref 1.7–7.7)
Neutrophils Relative %: 54 %
Platelet Count: 227 K/uL (ref 150–400)
RBC: 4.93 MIL/uL (ref 3.87–5.11)
RDW: 13.3 % (ref 11.5–15.5)
WBC Count: 4.1 K/uL (ref 4.0–10.5)
nRBC: 0 % (ref 0.0–0.2)

## 2024-08-18 LAB — CMP (CANCER CENTER ONLY)
ALT: 16 U/L (ref 0–44)
AST: 14 U/L — ABNORMAL LOW (ref 15–41)
Albumin: 4.5 g/dL (ref 3.5–5.0)
Alkaline Phosphatase: 66 U/L (ref 38–126)
Anion gap: 6 (ref 5–15)
BUN: 12 mg/dL (ref 8–23)
CO2: 29 mmol/L (ref 22–32)
Calcium: 9.9 mg/dL (ref 8.9–10.3)
Chloride: 104 mmol/L (ref 98–111)
Creatinine: 0.58 mg/dL (ref 0.44–1.00)
GFR, Estimated: >60 mL/min (ref >60)
Glucose, Bld: 82 mg/dL (ref 70–99)
Potassium: 4.5 mmol/L (ref 3.5–5.1)
Sodium: 139 mmol/L (ref 135–145)
Total Bilirubin: 0.3 mg/dL (ref 0.0–1.2)
Total Protein: 7.4 g/dL (ref 6.5–8.1)

## 2024-08-18 LAB — LACTATE DEHYDROGENASE: LDH: 130 U/L (ref 98–192)

## 2024-08-18 MED ORDER — IOHEXOL 300 MG/ML  SOLN
100.0000 mL | Freq: Once | INTRAMUSCULAR | Status: AC | PRN
  Administered 2024-08-18: 100 mL via INTRAVENOUS

## 2024-08-18 MED ORDER — SODIUM CHLORIDE (PF) 0.9 % IJ SOLN
INTRAMUSCULAR | Status: AC
Start: 2024-08-18 — End: 2024-08-18
  Filled 2024-08-18: qty 50

## 2024-08-25 ENCOUNTER — Inpatient Hospital Stay: Admitting: Internal Medicine

## 2024-08-25 VITALS — BP 111/84 | HR 84 | Temp 97.6°F | Resp 17 | Ht 65.0 in | Wt 161.0 lb

## 2024-08-25 DIAGNOSIS — Z08 Encounter for follow-up examination after completed treatment for malignant neoplasm: Secondary | ICD-10-CM | POA: Diagnosis not present

## 2024-08-25 DIAGNOSIS — C439 Malignant melanoma of skin, unspecified: Secondary | ICD-10-CM

## 2024-08-25 NOTE — Progress Notes (Signed)
 [facility] Telephone:([phone]) [phone]   [phone]  OFFICE PROGRESS NOTE  Pura Lenis, MD [address] [place] [address]  DIAGNOSIS: Lower extremity melanoma diagnosed in 2017.  She developed stage IV disease with abdominal adenopathy in May of 2022 with BRAF negative.  1) Initially diagnosed with Superficial spreading melanoma of the lower extremity diagnosed in August 2017. She was found to have 1.25 mm thickness without ulceration or lymphovascular invasion. She had 1 out of 2 lymph nodes positive. The size of the lymph node measuring 0.25 x 0.95 mm with pathological staging T2bN1.  2) Superficial spreading melanoma of the right shoulder diagnosed in August 2017.She is status post wide local excision and found tohave a 0.72 mm. These findings indicate a pathological staging of T1 NA.    PRIOR THERAPY: 1) She is status post local excision followed by sentinel lymph node sampling with 2 lymph nodes sampled one was involved with metastatic melanoma. The measurement was 0.25 x 0.95 mm in dimension of the capsule. Her final pathological stage stage is T2bN1. This was completed on 05/23/2016.   2) Nivolumab  total of 240 mg dose every 2 weeks for total of 4 doses. Started on 07/21/2016.  She completed 4 cycles of therapy for a total of 2 months out of planned 12.  Patient opted out continuing immunotherapy.   3) She is status post lymph node biopsy completed on Feb 15, 2021.   4) Ipilimumab  3 mg/kg with a nivolumab  at 1 mg/kg started on Mar 04, 2021.  She is status post 4 cycles of therapy with excellent response and minimal residual disease.  5) Nivolumab  480 mg every 4 weeks started on May 27, 2021.  Status post 30 cycles with tentative plan to complete 2 years of therapy in April 2024.   CURRENT THERAPY: Observation.  INTERVAL HISTORY: Sharon Kirk 63 y.o. female returns to the clinic today for follow-up visit accompanied by her  husband.Discussed the use of AI scribe software for clinical note transcription with the patient, who gave verbal consent to proceed.  History of Present Illness Sharon Kirk is a 63 year old female with stage four melanoma who presents for evaluation and restaging with a repeat CT scan. She is accompanied by her husband.  She has a history of lower extremity melanoma diagnosed in 2017, which progressed to stage four with abdominal lymphadenopathy in May 2022. She has undergone several treatments, including immunotherapy with ipilimumab  and nivolumab , followed by nivolumab  alone for two more years. She has been on observation since April 2024.  She is here for evaluation with a repeat CT scan of the chest, abdomen, and pelvis for restaging of her disease. No new complaints, weight loss, or night sweats, but she mentions 'just eating too much.' She denies headaches and states that her stomach 'still stays swell.'  Her melanoma initially started in her left leg.     MEDICAL HISTORY: Past Medical History:  Diagnosis Date   Allergy    Cancer (HCC)    Melanoma   Hyperlipidemia    IBS (irritable bowel syndrome)    Osteopenia    PONV (postoperative nausea and vomiting)    Thyroid  nodule     ALLERGIES:  is allergic to codeine.  MEDICATIONS:  Current Outpatient Medications  Medication Sig Dispense Refill   albuterol  (VENTOLIN  HFA) 108 (90 Base) MCG/ACT inhaler Inhale 1-2 puffs into the lungs every 6 (six) hours as needed for wheezing  or shortness of breath. 1 each 0   amoxicillin -clavulanate (AUGMENTIN ) 875-125 MG tablet Take 1 tablet by mouth every 12 (twelve) hours. 14 tablet 0   azithromycin  (ZITHROMAX ) 250 MG tablet Take 1 tablet (250 mg total) by mouth daily. Take first 2 tablets together, then 1 every day until finished. 6 tablet 0   benzonatate  (TESSALON ) 100 MG capsule Take 1 capsule (100 mg total) by mouth every 8 (eight) hours. 21 capsule 0   Biotin 1000 MCG CHEW Chew by  mouth.     calcium carbonate (OS-CAL) 600 MG TABS Take 600 mg by mouth 2 (two) times daily with a meal.     cholecalciferol (VITAMIN D ) 1000 UNITS tablet Take 5,000 Units by mouth daily.     Cyanocobalamin  (VITAMIN B-12 CR PO) Take 1 tablet by mouth daily.     ELDERBERRY PO Take by mouth.     loratadine (CLARITIN) 10 MG tablet Take 10 mg by mouth daily.     Multiple Vitamins-Minerals (ZINC PO) Take by mouth.     Omega-3 Fatty Acids (FISH OIL) 1200 MG CAPS Take by mouth.     phenazopyridine  (PYRIDIUM ) 200 MG tablet Take 1 tablet (200 mg total) by mouth 3 (three) times daily. 6 tablet 0   No current facility-administered medications for this visit.    SURGICAL HISTORY:  Past Surgical History:  Procedure Laterality Date   ABDOMINAL HYSTERECTOMY  1991   DILATION AND CURETTAGE OF UTERUS  1988   IR US  GUIDANCE  02/15/2021   MELANOMA EXCISION Right 05/23/2016   Procedure: WIDE  EXCISION RIGHT SHOULDER MELANOMA AND WIDE EXCISION LEFT LOWER LEG MELANOMA;  Surgeon: Krystal Russell, MD;  Location: [facility];  Service: General;  Laterality: Right;  right shoulder and left lower leg sites   SENTINEL NODE BIOPSY Left 05/23/2016   Procedure: LEFT INGUINAL SENTINEL LYMPH  NODE BIOPSY;  Surgeon: Krystal Russell, MD;  Location: [facility];  Service: General;  Laterality: Left;   THYROID  SURGERY  07/2015   NODULE    REVIEW OF SYSTEMS:  Constitutional: negative Eyes: negative Ears, nose, mouth, throat, and face: negative Respiratory: negative Cardiovascular: negative Gastrointestinal: negative Genitourinary:negative Integument/breast: negative Hematologic/lymphatic: negative Musculoskeletal:negative Neurological: negative Behavioral/Psych: negative Endocrine: negative Allergic/Immunologic: negative   PHYSICAL EXAMINATION: General appearance: alert, cooperative, and no distress Head: Normocephalic, without obvious abnormality, atraumatic Neck: no adenopathy, no JVD,  supple, symmetrical, trachea midline, and thyroid  not enlarged, symmetric, no tenderness/mass/nodules Lymph nodes: Cervical, supraclavicular, and axillary nodes normal. Resp: clear to auscultation bilaterally Back: symmetric, no curvature. ROM normal. No CVA tenderness. Cardio: regular rate and rhythm, S1, S2 normal, no murmur, click, rub or gallop GI: soft, non-tender; bowel sounds normal; no masses,  no organomegaly Extremities: extremities normal, atraumatic, no cyanosis or edema Neurologic: Alert and oriented X 3, normal strength and tone. Normal symmetric reflexes. Normal coordination and gait  ECOG PERFORMANCE STATUS: 1 - Symptomatic but completely ambulatory  Blood pressure 111/84, pulse 84, temperature 97.6 F (36.4 C), temperature source Temporal, resp. rate 17, height 5' 5 (1.651 m), weight 161 lb (73 kg), last menstrual period 06/30/1990, SpO2 96%.  LABORATORY DATA: Lab Results  Component Value Date   WBC 4.1 08/18/2024   HGB 14.1 08/18/2024   HCT 41.2 08/18/2024   MCV 83.6 08/18/2024   PLT 227 08/18/2024      Chemistry      Component Value Date/Time   NA 139 08/18/2024 0929   NA 140 09/08/2016 0755   K 4.5  08/18/2024 0929   K 3.9 09/08/2016 0755   CL 104 08/18/2024 0929   CO2 29 08/18/2024 0929   CO2 23 09/08/2016 0755   BUN 12 08/18/2024 0929   BUN 11.2 09/08/2016 0755   CREATININE 0.58 08/18/2024 0929   CREATININE 0.61 09/30/2019 0839   CREATININE 0.7 09/08/2016 0755      Component Value Date/Time   CALCIUM 9.9 08/18/2024 0929   CALCIUM 9.1 09/08/2016 0755   ALKPHOS 66 08/18/2024 0929   ALKPHOS 61 09/08/2016 0755   AST 14 (L) 08/18/2024 0929   AST 13 09/08/2016 0755   ALT 16 08/18/2024 0929   ALT 16 09/08/2016 0755   BILITOT 0.3 08/18/2024 0929   BILITOT 0.42 09/08/2016 0755       RADIOGRAPHIC STUDIES: CT Chest W Contrast Result Date: 08/20/2024 CLINICAL DATA:  Melanoma.  * Tracking Code: BO * EXAM: CT CHEST, ABDOMEN, AND PELVIS WITH CONTRAST  TECHNIQUE: Multidetector CT imaging of the chest, abdomen and pelvis was performed following the standard protocol during bolus administration of intravenous contrast. RADIATION DOSE REDUCTION: This exam was performed according to the departmental dose-optimization program which includes automated exposure control, adjustment of the mA and/or kV according to patient size and/or use of iterative reconstruction technique. CONTRAST:  OMNIPAQUE  IOHEXOL  300 MG/ML  SOLN COMPARISON:  PET 02/11/2024. FINDINGS: CT CHEST FINDINGS Cardiovascular: Coronary artery calcification. Heart is enlarged with left ventricular dilatation. No pericardial effusion. Mediastinum/Nodes: Asymmetrically enlarged and somewhat nodular left lobe of the thyroid . No pathologically enlarged mediastinal, hilar or axillary lymph nodes. Esophagus is grossly unremarkable. Lungs/Pleura: No suspicious pulmonary nodules. There may be basilar and peripheral predominant coarsened ground-glass and subpleural reticular densities. No pleural fluid. Airway is unremarkable. Musculoskeletal: Degenerative changes in the spine. No worrisome lytic or sclerotic lesions. CT ABDOMEN PELVIS FINDINGS Hepatobiliary: Liver and gallbladder are unremarkable. No biliary ductal dilatation. Pancreas: Negative. Spleen: Vague low-attenuation lesions in spleen are unchanged from 08/07/2023 and may represent hemangiomas. Adrenals/Urinary Tract: Adrenal glands and kidneys are unremarkable. Ureters are decompressed. Bladder is relatively low in volume. Stomach/Bowel: Stomach, small bowel, appendix and colon are unremarkable. Vascular/Lymphatic: Atherosclerotic calcification of the aorta. Calcified abdominal retroperitoneal lymph nodes may represent treated disease. Left external iliac lymph node has enlarged, now measuring 2.0 cm (2/115), previously 7 mm. There may be small left internal iliac lymph nodes as well, measuring up to 11 mm (2/109 and 114). Reproductive: Hysterectomy.   No adnexal mass. Other: No free fluid.  Mesenteries and peritoneum are unremarkable. Musculoskeletal: Degenerative changes in the spine. No worrisome lytic or sclerotic lesions. IMPRESSION: 1. Enlarging left iliac chain lymph nodes, compatible with disease progression. 2. Asymmetrically enlarged and somewhat nodular left thyroid . Recommend thyroid  ultrasound. (Ref: J Am Coll Radiol. 2015 Feb;12(2): 143-50). 3. Possible mild basilar interstitial lung abnormality or interstitial lung disease. If further evaluation is desired, high resolution chest CT without contrast is recommended. 4. Aortic atherosclerosis (ICD10-I70.0). Coronary artery calcification. Electronically Signed   By: Newell Eke M.D.   On: 08/20/2024 17:10   CT ABDOMEN PELVIS W CONTRAST Result Date: 08/20/2024 CLINICAL DATA:  Melanoma.  * Tracking Code: BO * EXAM: CT CHEST, ABDOMEN, AND PELVIS WITH CONTRAST TECHNIQUE: Multidetector CT imaging of the chest, abdomen and pelvis was performed following the standard protocol during bolus administration of intravenous contrast. RADIATION DOSE REDUCTION: This exam was performed according to the departmental dose-optimization program which includes automated exposure control, adjustment of the mA and/or kV according to patient size and/or use of iterative reconstruction technique.  CONTRAST:  OMNIPAQUE  IOHEXOL  300 MG/ML  SOLN COMPARISON:  PET 02/11/2024. FINDINGS: CT CHEST FINDINGS Cardiovascular: Coronary artery calcification. Heart is enlarged with left ventricular dilatation. No pericardial effusion. Mediastinum/Nodes: Asymmetrically enlarged and somewhat nodular left lobe of the thyroid . No pathologically enlarged mediastinal, hilar or axillary lymph nodes. Esophagus is grossly unremarkable. Lungs/Pleura: No suspicious pulmonary nodules. There may be basilar and peripheral predominant coarsened ground-glass and subpleural reticular densities. No pleural fluid. Airway is unremarkable.  Musculoskeletal: Degenerative changes in the spine. No worrisome lytic or sclerotic lesions. CT ABDOMEN PELVIS FINDINGS Hepatobiliary: Liver and gallbladder are unremarkable. No biliary ductal dilatation. Pancreas: Negative. Spleen: Vague low-attenuation lesions in spleen are unchanged from 08/07/2023 and may represent hemangiomas. Adrenals/Urinary Tract: Adrenal glands and kidneys are unremarkable. Ureters are decompressed. Bladder is relatively low in volume. Stomach/Bowel: Stomach, small bowel, appendix and colon are unremarkable. Vascular/Lymphatic: Atherosclerotic calcification of the aorta. Calcified abdominal retroperitoneal lymph nodes may represent treated disease. Left external iliac lymph node has enlarged, now measuring 2.0 cm (2/115), previously 7 mm. There may be small left internal iliac lymph nodes as well, measuring up to 11 mm (2/109 and 114). Reproductive: Hysterectomy.  No adnexal mass. Other: No free fluid.  Mesenteries and peritoneum are unremarkable. Musculoskeletal: Degenerative changes in the spine. No worrisome lytic or sclerotic lesions. IMPRESSION: 1. Enlarging left iliac chain lymph nodes, compatible with disease progression. 2. Asymmetrically enlarged and somewhat nodular left thyroid . Recommend thyroid  ultrasound. (Ref: J Am Coll Radiol. 2015 Feb;12(2): 143-50). 3. Possible mild basilar interstitial lung abnormality or interstitial lung disease. If further evaluation is desired, high resolution chest CT without contrast is recommended. 4. Aortic atherosclerosis (ICD10-I70.0). Coronary artery calcification. Electronically Signed   By: Newell Eke M.D.   On: 08/20/2024 17:10    ASSESSMENT AND PLAN: This is a very pleasant 63 years old white female with Lower extremity melanoma diagnosed in 2017.  She developed stage IV disease with abdominal adenopathy in May of 2022 with BRAF negative.  1) Initially diagnosed with Superficial spreading melanoma of the lower extremity diagnosed  in August 2017. She was found to have 1.25 mm thickness without ulceration or lymphovascular invasion. She had 1 out of 2 lymph nodes positive. The size of the lymph node measuring 0.25 x 0.95 mm with pathological staging T2bN1.  2) Superficial spreading melanoma of the right shoulder diagnosed in August 2017.She is status post wide local excision and found tohave a 0.72 mm. These findings indicate a pathological staging of T1 NA.  The patient is status post the following treatment. 1) status post local excision followed by sentinel lymph node sampling with 2 lymph nodes sampled one was involved with metastatic melanoma. The measurement was 0.25 x 0.95 mm in dimension of the capsule. Her final pathological stage stage is T2bN1. This was completed on 05/23/2016.   2) Nivolumab  total of 240 mg dose every 2 weeks for total of 4 doses. Started on 07/21/2016.  She completed 4 cycles of therapy for a total of 2 months out of planned 12.  Patient opted out continuing immunotherapy.   3) status post lymph node biopsy completed on Feb 15, 2021.   4) Ipilimumab  3 mg/kg with a nivolumab  at 1 mg/kg started on Mar 04, 2021.  She is status post 4 cycles of therapy with excellent response and minimal residual disease.  5) she completed maintenance treatment with nivolumab  480 Mg IV every 4 weeks status post 30 cycles.  The patient has been tolerating this treatment well with  no concerning adverse effects.  The patient is currently on observation. The patient is currently on observation.  She had repeat CT scan of the chest, abdomen and pelvis performed recently.  I personally independently reviewed the scan and discussed the result with the patient and her husband.  Her scan showed increase in size of left iliac lymph nodes. Assessment and Plan Assessment & Plan Stage IV melanoma of the left lower extremity with intra-abdominal and inguinal lymphadenopathy Stage IV melanoma initially diagnosed in 2017, with  progression to stage IV with abdominal lymphadenopathy in May 2022. Currently on observation since April 2024. Recent CT scan shows stable disease except for enlargement of inguinal lymph nodes, raising concern for potential recurrence or metastasis. Differential includes inflammation versus melanoma recurrence. - Ordered whole body PET scan to assess lymph node activity. - Scheduled follow-up appointment in 2-3 weeks to review PET scan results. - Will consider biopsy of suspicious lymph nodes if PET scan indicates activity. She was advised to call immediately if she has any concerning symptoms in the interval.  The patient voices understanding of current disease status and treatment options and is in agreement with the current care plan.  All questions were answered. The patient knows to call the clinic with any problems, questions or concerns. We can certainly see the patient much sooner if necessary.  The total time spent in the appointment was 30 minutes.  Disclaimer: This note was dictated with voice recognition software. Similar sounding words can inadvertently be transcribed and may not be corrected upon review.

## 2024-08-26 ENCOUNTER — Telehealth: Payer: Self-pay | Admitting: Internal Medicine

## 2024-08-26 NOTE — Telephone Encounter (Signed)
 Scheduled patient for next appointment. Called and spoke with the patient, she is aware.

## 2024-08-29 ENCOUNTER — Encounter: Payer: Self-pay | Admitting: Internal Medicine

## 2024-09-05 ENCOUNTER — Encounter (HOSPITAL_COMMUNITY)
Admission: RE | Admit: 2024-09-05 | Discharge: 2024-09-05 | Disposition: A | Discharge status: Home / Self Care | Source: Ambulatory Visit | Attending: Internal Medicine | Admitting: Internal Medicine

## 2024-09-05 DIAGNOSIS — C439 Malignant melanoma of skin, unspecified: Secondary | ICD-10-CM | POA: Diagnosis present

## 2024-09-05 LAB — GLUCOSE, CAPILLARY: Glucose-Capillary: 99 mg/dL (ref 70–99)

## 2024-09-05 MED ORDER — FLUDEOXYGLUCOSE F - 18 (FDG) INJECTION
7.0000 | Freq: Once | INTRAVENOUS | Status: AC
  Administered 2024-09-05: 7.9 via INTRAVENOUS

## 2024-09-10 ENCOUNTER — Inpatient Hospital Stay: Admitting: Internal Medicine

## 2024-09-10 VITALS — BP 99/73 | HR 72 | Temp 97.6°F | Resp 17 | Ht 65.0 in | Wt 149.0 lb

## 2024-09-10 DIAGNOSIS — C439 Malignant melanoma of skin, unspecified: Secondary | ICD-10-CM | POA: Diagnosis not present

## 2024-09-10 DIAGNOSIS — Z08 Encounter for follow-up examination after completed treatment for malignant neoplasm: Secondary | ICD-10-CM | POA: Diagnosis not present

## 2024-09-10 MED ORDER — PROCHLORPERAZINE MALEATE 10 MG PO TABS: 10.0000 mg | ORAL_TABLET | Freq: Four times a day (QID) | ORAL | 1 refills | Status: DC | PRN

## 2024-09-10 MED ORDER — ONDANSETRON HCL 8 MG PO TABS: 8.0000 mg | ORAL_TABLET | Freq: Three times a day (TID) | ORAL | 1 refills | Status: DC | PRN

## 2024-09-10 MED ORDER — LIDOCAINE-PRILOCAINE 2.5-2.5 % EX CREA: TOPICAL_CREAM | CUTANEOUS | 3 refills | Status: DC

## 2024-09-10 NOTE — Progress Notes (Signed)
 [facility] Telephone:([phone]) [phone]   Fax:([phone]) [phone]  OFFICE PROGRESS NOTE  Sharon Lenis, MD [address] [place] [address]  DIAGNOSIS: Lower extremity melanoma diagnosed in 2017.  She developed stage IV disease with abdominal adenopathy in May of 2022 with BRAF negative.  1) Initially diagnosed with Superficial spreading melanoma of the lower extremity diagnosed in August 2017. She was found to have 1.25 mm thickness without ulceration or lymphovascular invasion. She had 1 out of 2 lymph nodes positive. The size of the lymph node measuring 0.25 x 0.95 mm with pathological staging T2bN1.  2) Superficial spreading melanoma of the right shoulder diagnosed in August 2017.She is status post wide local excision and found tohave a 0.72 mm. These findings indicate a pathological staging of T1 NA.    PRIOR THERAPY: 1) She is status post local excision followed by sentinel lymph node sampling with 2 lymph nodes sampled one was involved with metastatic melanoma. The measurement was 0.25 x 0.95 mm in dimension of the capsule. Her final pathological stage stage is T2bN1. This was completed on 05/23/2016.   2) Nivolumab  total of 240 mg dose every 2 weeks for total of 4 doses. Started on 07/21/2016.  She completed 4 cycles of therapy for a total of 2 months out of planned 12.  Patient opted out continuing immunotherapy.   3) She is status post lymph node biopsy completed on Feb 15, 2021.   4) Ipilimumab  3 mg/kg with a nivolumab  at 1 mg/kg started on Mar 04, 2021.  She is status post 4 cycles of therapy with excellent response and minimal residual disease.  5) Nivolumab  480 mg every 4 weeks started on May 27, 2021.  Status post 30 cycles with tentative plan to complete 2 years of therapy in April 2024.   CURRENT THERAPY: Observation.  INTERVAL HISTORY: Sharon Kirk 63 y.o. female returns to the clinic today for follow-up visit accompanied by her  husband.Discussed the use of AI scribe software for clinical note transcription with the patient, who gave verbal consent to proceed.  History of Present Illness Sharon Kirk is a 64 year old female with stage four malignant melanoma who presents for evaluation and discussion of recent imaging results. She is accompanied by her husband.  Diagnosed with stage four malignant melanoma in August 2017, initially located in the left lower extremity. Underwent local excision with central lymph node biopsy and received two cycles of nivolumab  as adjuvant treatment.  In May 2022, experienced disease progression and was treated with a combination of ipilimumab  and nivolumab , followed by maintenance nivolumab  80 mg IV every four weeks for two years, completed in April 2024. Currently under observation.  Recent imaging studies revealed suspicious lymphadenopathy in the left iliac chain.  No new symptoms since the last visit and no issues during the PET scan procedure.  Has a history of back pain but reports no other issues at this time.    MEDICAL HISTORY: Past Medical History:  Diagnosis Date   Allergy    Cancer (HCC)    Melanoma   Hyperlipidemia    IBS (irritable bowel syndrome)    Osteopenia    PONV (postoperative nausea and vomiting)    Thyroid  nodule     ALLERGIES:  is allergic to codeine.  MEDICATIONS:  Current Outpatient Medications  Medication Sig Dispense Refill   albuterol  (VENTOLIN  HFA) 108 (90 Base) MCG/ACT inhaler Inhale 1-2 puffs into the lungs every 6 (  six) hours as needed for wheezing or shortness of breath. 1 each 0   amoxicillin -clavulanate (AUGMENTIN ) 875-125 MG tablet Take 1 tablet by mouth every 12 (twelve) hours. 14 tablet 0   azithromycin  (ZITHROMAX ) 250 MG tablet Take 1 tablet (250 mg total) by mouth daily. Take first 2 tablets together, then 1 every day until finished. 6 tablet 0   benzonatate  (TESSALON ) 100 MG capsule Take 1 capsule (100 mg total) by mouth every  8 (eight) hours. 21 capsule 0   Biotin 1000 MCG CHEW Chew by mouth.     calcium carbonate (OS-CAL) 600 MG TABS Take 600 mg by mouth 2 (two) times daily with a meal.     cholecalciferol (VITAMIN D ) 1000 UNITS tablet Take 5,000 Units by mouth daily.     Cyanocobalamin  (VITAMIN B-12 CR PO) Take 1 tablet by mouth daily.     ELDERBERRY PO Take by mouth.     loratadine (CLARITIN) 10 MG tablet Take 10 mg by mouth daily.     Multiple Vitamins-Minerals (ZINC PO) Take by mouth.     Omega-3 Fatty Acids (FISH OIL) 1200 MG CAPS Take by mouth.     phenazopyridine  (PYRIDIUM ) 200 MG tablet Take 1 tablet (200 mg total) by mouth 3 (three) times daily. 6 tablet 0   No current facility-administered medications for this visit.    SURGICAL HISTORY:  Past Surgical History:  Procedure Laterality Date   ABDOMINAL HYSTERECTOMY  1991   DILATION AND CURETTAGE OF UTERUS  1988   IR US  GUIDANCE  02/15/2021   MELANOMA EXCISION Right 05/23/2016   Procedure: WIDE  EXCISION RIGHT SHOULDER MELANOMA AND WIDE EXCISION LEFT LOWER LEG MELANOMA;  Surgeon: Krystal Russell, MD;  Location: [facility];  Service: General;  Laterality: Right;  right shoulder and left lower leg sites   SENTINEL NODE BIOPSY Left 05/23/2016   Procedure: LEFT INGUINAL SENTINEL LYMPH  NODE BIOPSY;  Surgeon: Krystal Russell, MD;  Location:  SURGERY CENTER;  Service: General;  Laterality: Left;   THYROID  SURGERY  07/2015   NODULE    REVIEW OF SYSTEMS:  Constitutional: negative Eyes: negative Ears, nose, mouth, throat, and face: negative Respiratory: negative Cardiovascular: negative Gastrointestinal: negative Genitourinary:negative Integument/breast: negative Hematologic/lymphatic: negative Musculoskeletal:negative Neurological: negative Behavioral/Psych: negative Endocrine: negative Allergic/Immunologic: negative   PHYSICAL EXAMINATION: General appearance: alert, cooperative, and no distress Head: Normocephalic, without  obvious abnormality, atraumatic Neck: no adenopathy, no JVD, supple, symmetrical, trachea midline, and thyroid  not enlarged, symmetric, no tenderness/mass/nodules Lymph nodes: Cervical, supraclavicular, and axillary nodes normal. Resp: clear to auscultation bilaterally Back: symmetric, no curvature. ROM normal. No CVA tenderness. Cardio: regular rate and rhythm, S1, S2 normal, no murmur, click, rub or gallop GI: soft, non-tender; bowel sounds normal; no masses,  no organomegaly Extremities: extremities normal, atraumatic, no cyanosis or edema Neurologic: Alert and oriented X 3, normal strength and tone. Normal symmetric reflexes. Normal coordination and gait  ECOG PERFORMANCE STATUS: 1 - Symptomatic but completely ambulatory  Blood pressure 99/73, pulse 72, temperature 97.6 F (36.4 C), temperature source Temporal, resp. rate 17, height 5' 5 (1.651 m), weight 149 lb (67.6 kg), last menstrual period 06/30/1990, SpO2 97%.  LABORATORY DATA: Lab Results  Component Value Date   WBC 4.1 08/18/2024   HGB 14.1 08/18/2024   HCT 41.2 08/18/2024   MCV 83.6 08/18/2024   PLT 227 08/18/2024      Chemistry      Component Value Date/Time   NA 139 08/18/2024 0929   NA 140  09/08/2016 0755   K 4.5 08/18/2024 0929   K 3.9 09/08/2016 0755   CL 104 08/18/2024 0929   CO2 29 08/18/2024 0929   CO2 23 09/08/2016 0755   BUN 12 08/18/2024 0929   BUN 11.2 09/08/2016 0755   CREATININE 0.58 08/18/2024 0929   CREATININE 0.61 09/30/2019 0839   CREATININE 0.7 09/08/2016 0755      Component Value Date/Time   CALCIUM 9.9 08/18/2024 0929   CALCIUM 9.1 09/08/2016 0755   ALKPHOS 66 08/18/2024 0929   ALKPHOS 61 09/08/2016 0755   AST 14 (L) 08/18/2024 0929   AST 13 09/08/2016 0755   ALT 16 08/18/2024 0929   ALT 16 09/08/2016 0755   BILITOT 0.3 08/18/2024 0929   BILITOT 0.42 09/08/2016 0755       RADIOGRAPHIC STUDIES: NM PET Image Restage (PS) Whole Body Result Date: 09/09/2024 EXAM: PET CT WHOLE  BODY 09/05/2024 10:01:51 AM TECHNIQUE: RADIOPHARMACEUTICAL: 7.9 mCi F-18 FDG Uptake time 60 minutes. Glucose level 99 mg/dl. PET imaging was acquired from the skull vertex through the feet. Non-contrast enhanced computed tomography was obtained for attenuation correction and anatomic localization. COMPARISON: CT 08/18/2024, PET CT 02/11/2024. CLINICAL HISTORY: Melanoma, stage IIB/III/IV, monitor. FINDINGS: HEAD AND NECK: No metabolically active cervical lymphadenopathy. CHEST: No hypermetabolic mediastinal nodes. No suspicious pulmonary nodules. There is mild activity within the musculature deep to the tip of the right scapula with SUVmax of 3.2 on image 104. This is favored benign musculoskeletal inflammation. ABDOMEN AND PELVIS: Enlarged and intensely hypermetabolic left external iliac lymph node measures 2.2 x 3.2 cm with SUVmax equal 20.7 on image 208. No additional hypermetabolic lymph nodes in the inguinal region, pelvis, abdomen. Physiologic activity within the gastrointestinal and genitourinary systems. BONES AND SOFT TISSUE: There is mild activity within the musculature deep to the tip of the right scapula with SUVmax of 3.2 on image 104. This is favored benign musculoskeletal inflammation. Treatment No metabolically active aggressive osseous lesion. IMPRESSION: 1. Enlarged and intensely hypermetabolic left external iliac lymph node, measuring 2.2 x 3.2 cm consistent with metastatic melanoma. 2. No additional hypermetabolic lymphadenopathy in the inguinal region, pelvis, or abdomen. 3. No additional evidence of metastatic melanoma on on Whole body CT scan Electronically signed by: Norleen Boxer MD 09/09/2024 10:55 AM EST RP Workstation: [id number]   CT Chest W Contrast Result Date: 08/20/2024 CLINICAL DATA:  Melanoma.  * Tracking Code: BO * EXAM: CT CHEST, ABDOMEN, AND PELVIS WITH CONTRAST TECHNIQUE: Multidetector CT imaging of the chest, abdomen and pelvis was performed following the standard protocol  during bolus administration of intravenous contrast. RADIATION DOSE REDUCTION: This exam was performed according to the departmental dose-optimization program which includes automated exposure control, adjustment of the mA and/or kV according to patient size and/or use of iterative reconstruction technique. CONTRAST:  OMNIPAQUE  IOHEXOL  300 MG/ML  SOLN COMPARISON:  PET 02/11/2024. FINDINGS: CT CHEST FINDINGS Cardiovascular: Coronary artery calcification. Heart is enlarged with left ventricular dilatation. No pericardial effusion. Mediastinum/Nodes: Asymmetrically enlarged and somewhat nodular left lobe of the thyroid . No pathologically enlarged mediastinal, hilar or axillary lymph nodes. Esophagus is grossly unremarkable. Lungs/Pleura: No suspicious pulmonary nodules. There may be basilar and peripheral predominant coarsened ground-glass and subpleural reticular densities. No pleural fluid. Airway is unremarkable. Musculoskeletal: Degenerative changes in the spine. No worrisome lytic or sclerotic lesions. CT ABDOMEN PELVIS FINDINGS Hepatobiliary: Liver and gallbladder are unremarkable. No biliary ductal dilatation. Pancreas: Negative. Spleen: Vague low-attenuation lesions in spleen are unchanged from 08/07/2023 and may represent hemangiomas. Adrenals/Urinary Tract:  Adrenal glands and kidneys are unremarkable. Ureters are decompressed. Bladder is relatively low in volume. Stomach/Bowel: Stomach, small bowel, appendix and colon are unremarkable. Vascular/Lymphatic: Atherosclerotic calcification of the aorta. Calcified abdominal retroperitoneal lymph nodes may represent treated disease. Left external iliac lymph node has enlarged, now measuring 2.0 cm (2/115), previously 7 mm. There may be small left internal iliac lymph nodes as well, measuring up to 11 mm (2/109 and 114). Reproductive: Hysterectomy.  No adnexal mass. Other: No free fluid.  Mesenteries and peritoneum are unremarkable. Musculoskeletal:  Degenerative changes in the spine. No worrisome lytic or sclerotic lesions. IMPRESSION: 1. Enlarging left iliac chain lymph nodes, compatible with disease progression. 2. Asymmetrically enlarged and somewhat nodular left thyroid . Recommend thyroid  ultrasound. (Ref: J Am Coll Radiol. 2015 Feb;12(2): 143-50). 3. Possible mild basilar interstitial lung abnormality or interstitial lung disease. If further evaluation is desired, high resolution chest CT without contrast is recommended. 4. Aortic atherosclerosis (ICD10-I70.0). Coronary artery calcification. Electronically Signed   By: Newell Eke M.D.   On: 08/20/2024 17:10   CT ABDOMEN PELVIS W CONTRAST Result Date: 08/20/2024 CLINICAL DATA:  Melanoma.  * Tracking Code: BO * EXAM: CT CHEST, ABDOMEN, AND PELVIS WITH CONTRAST TECHNIQUE: Multidetector CT imaging of the chest, abdomen and pelvis was performed following the standard protocol during bolus administration of intravenous contrast. RADIATION DOSE REDUCTION: This exam was performed according to the departmental dose-optimization program which includes automated exposure control, adjustment of the mA and/or kV according to patient size and/or use of iterative reconstruction technique. CONTRAST:  OMNIPAQUE  IOHEXOL  300 MG/ML  SOLN COMPARISON:  PET 02/11/2024. FINDINGS: CT CHEST FINDINGS Cardiovascular: Coronary artery calcification. Heart is enlarged with left ventricular dilatation. No pericardial effusion. Mediastinum/Nodes: Asymmetrically enlarged and somewhat nodular left lobe of the thyroid . No pathologically enlarged mediastinal, hilar or axillary lymph nodes. Esophagus is grossly unremarkable. Lungs/Pleura: No suspicious pulmonary nodules. There may be basilar and peripheral predominant coarsened ground-glass and subpleural reticular densities. No pleural fluid. Airway is unremarkable. Musculoskeletal: Degenerative changes in the spine. No worrisome lytic or sclerotic lesions. CT ABDOMEN PELVIS  FINDINGS Hepatobiliary: Liver and gallbladder are unremarkable. No biliary ductal dilatation. Pancreas: Negative. Spleen: Vague low-attenuation lesions in spleen are unchanged from 08/07/2023 and may represent hemangiomas. Adrenals/Urinary Tract: Adrenal glands and kidneys are unremarkable. Ureters are decompressed. Bladder is relatively low in volume. Stomach/Bowel: Stomach, small bowel, appendix and colon are unremarkable. Vascular/Lymphatic: Atherosclerotic calcification of the aorta. Calcified abdominal retroperitoneal lymph nodes may represent treated disease. Left external iliac lymph node has enlarged, now measuring 2.0 cm (2/115), previously 7 mm. There may be small left internal iliac lymph nodes as well, measuring up to 11 mm (2/109 and 114). Reproductive: Hysterectomy.  No adnexal mass. Other: No free fluid.  Mesenteries and peritoneum are unremarkable. Musculoskeletal: Degenerative changes in the spine. No worrisome lytic or sclerotic lesions. IMPRESSION: 1. Enlarging left iliac chain lymph nodes, compatible with disease progression. 2. Asymmetrically enlarged and somewhat nodular left thyroid . Recommend thyroid  ultrasound. (Ref: J Am Coll Radiol. 2015 Feb;12(2): 143-50). 3. Possible mild basilar interstitial lung abnormality or interstitial lung disease. If further evaluation is desired, high resolution chest CT without contrast is recommended. 4. Aortic atherosclerosis (ICD10-I70.0). Coronary artery calcification. Electronically Signed   By: Newell Eke M.D.   On: 08/20/2024 17:10    ASSESSMENT AND PLAN: This is a very pleasant 63 years old white female with Lower extremity melanoma diagnosed in 2017.  She developed stage IV disease with abdominal adenopathy in May of 2022 with  BRAF negative.  1) Initially diagnosed with Superficial spreading melanoma of the lower extremity diagnosed in August 2017. She was found to have 1.25 mm thickness without ulceration or lymphovascular invasion. She had 1  out of 2 lymph nodes positive. The size of the lymph node measuring 0.25 x 0.95 mm with pathological staging T2bN1.  2) Superficial spreading melanoma of the right shoulder diagnosed in August 2017.She is status post wide local excision and found tohave a 0.72 mm. These findings indicate a pathological staging of T1 NA.  The patient is status post the following treatment. 1) status post local excision followed by sentinel lymph node sampling with 2 lymph nodes sampled one was involved with metastatic melanoma. The measurement was 0.25 x 0.95 mm in dimension of the capsule. Her final pathological stage stage is T2bN1. This was completed on 05/23/2016.   2) Nivolumab  total of 240 mg dose every 2 weeks for total of 4 doses. Started on 07/21/2016.  She completed 4 cycles of therapy for a total of 2 months out of planned 12.  Patient opted out continuing immunotherapy.   3) status post lymph node biopsy completed on Feb 15, 2021.   4) Ipilimumab  3 mg/kg with a nivolumab  at 1 mg/kg started on Mar 04, 2021.  She is status post 4 cycles of therapy with excellent response and minimal residual disease.  5) she completed maintenance treatment with nivolumab  480 Mg IV every 4 weeks status post 30 cycles.  The patient has been tolerating this treatment well with no concerning adverse effects.  The patient is currently on observation. The patient is currently on observation.  She had repeat PET scan that showed hypermetabolic left iliac lymph node but no other evidence of metastatic disease. Assessment and Plan Assessment & Plan Stage IV malignant melanoma of left lower extremity with left iliac lymph node metastasis Stage IV malignant melanoma initially diagnosed in August 2017, originating in the left lower extremity. Recent PET scan shows suspicious lymphadenopathy in the left iliac chain, likely related to melanoma. No other metastatic sites identified. Previous treatment included local excision, nivolumab ,  and a combination of ipilimumab  and nivolumab  with good response. Currently on observation since April 2024. - Referred to surgical consultation for evaluation of left iliac lymph node. - Will initiate immune therapy with nivolumab  every three weeks for four cycles, followed by monthly maintenance therapy. - Will monitor response to treatment every three months. She was advised to call immediately if she has any other concerning symptoms in the interval. The patient voices understanding of current disease status and treatment options and is in agreement with the current care plan.  All questions were answered. The patient knows to call the clinic with any problems, questions or concerns. We can certainly see the patient much sooner if necessary.  The total time spent in the appointment was 30 minutes.  Disclaimer: This note was dictated with voice recognition software. Similar sounding words can inadvertently be transcribed and may not be corrected upon review.

## 2024-09-13 ENCOUNTER — Other Ambulatory Visit: Payer: Self-pay

## 2024-09-14 ENCOUNTER — Other Ambulatory Visit: Payer: Self-pay

## 2024-09-15 NOTE — Progress Notes (Signed)
 Pharmacist Chemotherapy Monitoring - Initial Assessment    Anticipated start date: 09/24/24   The following has been reviewed per standard work regarding the patient's treatment regimen: The patient's diagnosis, treatment plan and drug doses, and organ/hematologic function Lab orders and baseline tests specific to treatment regimen  The treatment plan start date, drug sequencing, and pre-medications Prior authorization status  Patient's documented medication list, including drug-drug interaction screen and prescriptions for anti-emetics and supportive care specific to the treatment regimen The drug concentrations, fluid compatibility, administration routes, and timing of the medications to be used The patient's access for treatment and lifetime cumulative dose history, if applicable  The patient's medication allergies and previous infusion related reactions, if applicable   Changes made to treatment plan:  N/A  Follow up needed:  Pending authorization for treatment  and confirmation of doses per Dr. Sherrod. Pt received standard nivo 1mg /kg and ipi 3mg /kg with last tx cycle in 2022, but Checkmate 511 dosing is ordered for this round. Staff message sent to Dr. Sherrod to confirm dosing.  Addendum: IPI 3mg /kg and Nivo 1 mg/kg per Dr. Sherrod. Message sent requesting tx plan change to the correct plan.   Addendum #2: Dr. Sherrod has ordered the correct tx plan. Sharon Kirk is pending.  Zadin Lange, PharmD, MBA

## 2024-09-16 ENCOUNTER — Other Ambulatory Visit: Payer: Self-pay | Admitting: Internal Medicine

## 2024-09-16 DIAGNOSIS — C439 Malignant melanoma of skin, unspecified: Secondary | ICD-10-CM

## 2024-09-24 ENCOUNTER — Inpatient Hospital Stay: Attending: Internal Medicine

## 2024-09-24 ENCOUNTER — Inpatient Hospital Stay

## 2024-09-24 VITALS — BP 106/83 | HR 74 | Temp 97.8°F | Resp 15 | Wt 161.2 lb

## 2024-09-24 DIAGNOSIS — C439 Malignant melanoma of skin, unspecified: Secondary | ICD-10-CM

## 2024-09-24 DIAGNOSIS — C4370 Malignant melanoma of unspecified lower limb, including hip: Secondary | ICD-10-CM | POA: Diagnosis present

## 2024-09-24 DIAGNOSIS — R21 Rash and other nonspecific skin eruption: Secondary | ICD-10-CM | POA: Insufficient documentation

## 2024-09-24 DIAGNOSIS — H538 Other visual disturbances: Secondary | ICD-10-CM | POA: Insufficient documentation

## 2024-09-24 DIAGNOSIS — Z5112 Encounter for antineoplastic immunotherapy: Secondary | ICD-10-CM | POA: Insufficient documentation

## 2024-09-24 DIAGNOSIS — Z79620 Long term (current) use of immunosuppressive biologic: Secondary | ICD-10-CM | POA: Diagnosis not present

## 2024-09-24 LAB — CBC WITH DIFFERENTIAL (CANCER CENTER ONLY)
Abs Immature Granulocytes: 0 K/uL (ref 0.00–0.07)
Basophils Absolute: 0 K/uL (ref 0.0–0.1)
Basophils Relative: 0 %
Eosinophils Absolute: 0.1 K/uL (ref 0.0–0.5)
Eosinophils Relative: 2 %
HCT: 40.5 % (ref 36.0–46.0)
Hemoglobin: 13.7 g/dL (ref 12.0–15.0)
Immature Granulocytes: 0 %
Lymphocytes Relative: 34 %
Lymphs Abs: 1.4 K/uL (ref 0.7–4.0)
MCH: 28.2 pg (ref 26.0–34.0)
MCHC: 33.8 g/dL (ref 30.0–36.0)
MCV: 83.3 fL (ref 80.0–100.0)
Monocytes Absolute: 0.3 K/uL (ref 0.1–1.0)
Monocytes Relative: 8 %
Neutro Abs: 2.3 K/uL (ref 1.7–7.7)
Neutrophils Relative %: 56 %
Platelet Count: 215 K/uL (ref 150–400)
RBC: 4.86 MIL/uL (ref 3.87–5.11)
RDW: 13.5 % (ref 11.5–15.5)
WBC Count: 4.2 K/uL (ref 4.0–10.5)
nRBC: 0 % (ref 0.0–0.2)

## 2024-09-24 LAB — CMP (CANCER CENTER ONLY)
ALT: 17 U/L (ref 0–44)
AST: 18 U/L (ref 15–41)
Albumin: 4.4 g/dL (ref 3.5–5.0)
Alkaline Phosphatase: 68 U/L (ref 38–126)
Anion gap: 11 (ref 5–15)
BUN: 13 mg/dL (ref 8–23)
CO2: 26 mmol/L (ref 22–32)
Calcium: 9.7 mg/dL (ref 8.9–10.3)
Chloride: 104 mmol/L (ref 98–111)
Creatinine: 0.59 mg/dL (ref 0.44–1.00)
GFR, Estimated: >60 mL/min (ref >60)
Glucose, Bld: 106 mg/dL — ABNORMAL HIGH (ref 70–99)
Potassium: 3.7 mmol/L (ref 3.5–5.1)
Sodium: 141 mmol/L (ref 135–145)
Total Bilirubin: 0.2 mg/dL (ref 0.0–1.2)
Total Protein: 7 g/dL (ref 6.5–8.1)

## 2024-09-24 LAB — TSH: TSH: 2.39 u[IU]/mL (ref 0.350–4.500)

## 2024-09-24 MED ORDER — FAMOTIDINE IN NACL 20-0.9 MG/50ML-% IV SOLN
20.0000 mg | Freq: Once | INTRAVENOUS | Status: AC
  Administered 2024-09-24: 20 mg via INTRAVENOUS
  Filled 2024-09-24: qty 50

## 2024-09-24 MED ORDER — SODIUM CHLORIDE 0.9 % IV SOLN: INTRAVENOUS | Status: DC

## 2024-09-24 MED ORDER — DIPHENHYDRAMINE HCL 50 MG/ML IJ SOLN
25.0000 mg | Freq: Once | INTRAMUSCULAR | Status: AC
  Administered 2024-09-24: 25 mg via INTRAVENOUS
  Filled 2024-09-24: qty 1

## 2024-09-24 MED ORDER — SODIUM CHLORIDE 0.9 % IV SOLN
1.0000 mg/kg | Freq: Once | INTRAVENOUS | Status: AC
  Administered 2024-09-24: 68 mg via INTRAVENOUS
  Filled 2024-09-24: qty 6.8

## 2024-09-24 MED ORDER — SODIUM CHLORIDE 0.9 % IV SOLN
3.0000 mg/kg | Freq: Once | INTRAVENOUS | Status: AC
  Administered 2024-09-24: 200 mg via INTRAVENOUS
  Filled 2024-09-24: qty 40

## 2024-09-24 NOTE — Patient Instructions (Signed)
 [facility] - A DEPT OF [facility]  Discharge Instructions: Thank you for choosing [facility] to provide your oncology and hematology care.   If you have a lab appointment with the Cancer Center, please go directly to the Cancer Center and check in at the registration area.   Wear comfortable clothing and clothing appropriate for easy access to any Portacath or PICC line.   We strive to give you quality time with your provider. You may need to reschedule your appointment if you arrive late (15 or more minutes).  Arriving late affects you and other patients whose appointments are after yours.  Also, if you miss three or more appointments without notifying the office, you may be dismissed from the clinic at the provider's discretion.      For prescription refill requests, have your pharmacy contact our office and allow 72 hours for refills to be completed.    Today you received the following chemotherapy and/or immunotherapy agents: nivolumab  and ipilimumab       To help prevent nausea and vomiting after your treatment, we encourage you to take your nausea medication as directed.  BELOW ARE SYMPTOMS THAT SHOULD BE REPORTED IMMEDIATELY: *FEVER GREATER THAN 100.4 F (38 C) OR HIGHER *CHILLS OR SWEATING *NAUSEA AND VOMITING THAT IS NOT CONTROLLED WITH YOUR NAUSEA MEDICATION *UNUSUAL SHORTNESS OF BREATH *UNUSUAL BRUISING OR BLEEDING *URINARY PROBLEMS (pain or burning when urinating, or frequent urination) *BOWEL PROBLEMS (unusual diarrhea, constipation, pain near the anus) TENDERNESS IN MOUTH AND THROAT WITH OR WITHOUT PRESENCE OF ULCERS (sore throat, sores in mouth, or a toothache) UNUSUAL RASH, SWELLING OR PAIN  UNUSUAL VAGINAL DISCHARGE OR ITCHING   Items with * indicate a potential emergency and should be followed up as soon as possible or go to the Emergency Department if any problems should occur.  Please show the CHEMOTHERAPY ALERT CARD or  IMMUNOTHERAPY ALERT CARD at check-in to the Emergency Department and triage nurse.  Should you have questions after your visit or need to cancel or reschedule your appointment, please contact [facility] - A DEPT OF [facility]  Dept: [phone]  and follow the prompts.  Office hours are 8:00 a.m. to 4:30 p.m. Monday - Friday. Please note that voicemails left after 4:00 p.m. may not be returned until the following business day.  We are closed weekends and major holidays. You have access to a nurse at all times for urgent questions. Please call the main number to the clinic Dept: [phone] and follow the prompts.   For any non-urgent questions, you may also contact your provider using MyChart. We now offer e-Visits for anyone 23 and older to request care online for non-urgent symptoms. For details visit [email or web address].   Also download the MyChart app! Go to the app store, search "MyChart", open the app, select [place], and log in with your MyChart username and password.

## 2024-09-24 NOTE — Progress Notes (Signed)
 Ipilimumab  (YERVOY ) Patient Monitoring Assessment   Is the patient experiencing any of the following general symptoms?:  [] Difficulty performing normal activities [] Feeling sluggish or cold all the time [x] Unusual weight gain [] Constant or unusual headaches [] Feeling dizzy or faint [] Changes in eyesight (blurry vision, double vision, or other vision problems) [] Changes in mood or behavior (ex: decreased sex drive, irritability, or forgetfulness) [] Starting new medications (ex: steroids, other medications that lower immune response) [] Patient is not experiencing any of the general symptoms above.    Gastrointestinal  Patient is having 0-1 bowel movements each day.  Is this different from baseline? [] Yes [x] No Are your stools watery or do they have a foul smell? [] Yes [x] No Have you seen blood in your stools? [] Yes [x] No Are your stools dark, tarry, or sticky? [] Yes [x] No Are you having pain or tenderness in your belly? [] Yes [x] No  Skin Does your skin itch? [] Yes [x] No Do you have a rash? [] Yes [x] No Has your skin blistered and/or peeled? [] Yes [x] No Do you have sores in your mouth? [] Yes [x] No  Hepatic Has your urine been dark or tea colored? [] Yes [x] No Have you noticed that your skin or the whites of your eyes are turning yellow? [] Yes [x] No Are you bleeding or bruising more easily than normal? [] Yes [x] No Are you nauseous and/or vomiting? [] Yes [x] No Do you have pain on the right side of your stomach? [] Yes [x] No  Neurologic  Are you having unusual weakness of legs, arms, or face? [] Yes [x] No Are you having numbness or tingling in your hands or feet? [] Yes [x] No  Sharon Kirk

## 2024-09-25 LAB — T4: T4, Total: 7.2 ug/dL (ref 4.5–12.0)

## 2024-10-01 ENCOUNTER — Telehealth: Payer: Self-pay | Admitting: Medical Oncology

## 2024-10-01 NOTE — Telephone Encounter (Signed)
 Ipilimumab  (YERVOY ) Patient Monitoring Assessment   Is the patient experiencing any of the following general symptoms?:  [ ] Difficulty performing normal activities [ ] Feeling sluggish or cold all the time [ ] Unusual weight gain [ ] Constant or unusual headaches [ ] Feeling dizzy or faint [ ] Changes in eyesight (blurry vision, double vision, or other vision problems) [ ] Changes in mood or behavior (ex: decreased sex drive, irritability, or forgetfulness) [ ] Starting new medications (ex: steroids, other medications that lower immune response) [ x]Patient is not experiencing any of the general symptoms above.   Gastrointestinal  Patient is having 1 bowel movements each day.  Is this different from baseline? [x ]Yes [ ] No  Usually goes every 3 days Are your stools watery or do they have a foul smell? [ ] Yes [ ] No Have you seen blood in your stools? [ ] Yes [ ] No Are your stools dark, tarry, or sticky? [ ] Yes [ ] No Are you having pain or tenderness in your belly? [ ] Yes [ ] No  Skin Does your skin itch? [ ] Yes [ x]No- it did itch at her hand wrist area yesterday Do you have a rash? [x ]Yes [ ] No Has your skin blistered and/or peeled? [ ] Yes [ x]No Do you have sores in your mouth? [ ] Yes [ x]No  Hepatic Has your urine been dark or tea colored? [ ] Yes [ x]No Have you noticed that your skin or the whites of your eyes are turning yellow? [ ] Yes [x ]No Are you bleeding or bruising more easily than normal? [ ] Yes [x ]No Are you nauseous and/or vomiting? [ ] Yes [x ]No Do you have pain on the right side of your stomach? [ ] Yes [ x]No  Neurologic  Are you having unusual weakness of legs, arms, or face? [ ] Yes [ x]No Are you having numbness or tingling in your hands or feet? [ ] Yes [ x]No  Carolee Loa VEAR Nilsa a rash appeared above both  hands and no where else.. She described that the rash was only a small red area,bumpy , itchy . She applied hydrocortisone and vasoline over rash and today  it is not itching, the rash has diminished. Per Dr. Sherrod , I instructed the pt to monitor for any other symptoms or concerns.She voiced understanding.

## 2024-10-03 ENCOUNTER — Other Ambulatory Visit: Payer: Self-pay

## 2024-10-15 ENCOUNTER — Telehealth: Payer: Self-pay | Admitting: Medical Oncology

## 2024-10-15 ENCOUNTER — Inpatient Hospital Stay (HOSPITAL_BASED_OUTPATIENT_CLINIC_OR_DEPARTMENT_OTHER): Admitting: Internal Medicine

## 2024-10-15 ENCOUNTER — Inpatient Hospital Stay

## 2024-10-15 ENCOUNTER — Other Ambulatory Visit: Payer: Self-pay | Admitting: Physician Assistant

## 2024-10-15 VITALS — BP 105/75 | HR 67 | Temp 97.6°F | Resp 17 | Ht 65.0 in | Wt 160.0 lb

## 2024-10-15 DIAGNOSIS — F40240 Claustrophobia: Secondary | ICD-10-CM

## 2024-10-15 DIAGNOSIS — C439 Malignant melanoma of skin, unspecified: Secondary | ICD-10-CM

## 2024-10-15 DIAGNOSIS — Z5112 Encounter for antineoplastic immunotherapy: Secondary | ICD-10-CM | POA: Diagnosis not present

## 2024-10-15 LAB — CMP (CANCER CENTER ONLY)
ALT: 17 U/L (ref 0–44)
AST: 18 U/L (ref 15–41)
Albumin: 4.4 g/dL (ref 3.5–5.0)
Alkaline Phosphatase: 78 U/L (ref 38–126)
Anion gap: 10 (ref 5–15)
BUN: 12 mg/dL (ref 8–23)
CO2: 27 mmol/L (ref 22–32)
Calcium: 9.7 mg/dL (ref 8.9–10.3)
Chloride: 102 mmol/L (ref 98–111)
Creatinine: 0.57 mg/dL (ref 0.44–1.00)
GFR, Estimated: >60 mL/min
Glucose, Bld: 92 mg/dL (ref 70–99)
Potassium: 4.9 mmol/L (ref 3.5–5.1)
Sodium: 139 mmol/L (ref 135–145)
Total Bilirubin: 0.3 mg/dL (ref 0.0–1.2)
Total Protein: 7.3 g/dL (ref 6.5–8.1)

## 2024-10-15 LAB — CBC WITH DIFFERENTIAL (CANCER CENTER ONLY)
Abs Immature Granulocytes: 0.02 K/uL (ref 0.00–0.07)
Basophils Absolute: 0 K/uL (ref 0.0–0.1)
Basophils Relative: 1 %
Eosinophils Absolute: 0.1 K/uL (ref 0.0–0.5)
Eosinophils Relative: 2 %
HCT: 42.6 % (ref 36.0–46.0)
Hemoglobin: 14.3 g/dL (ref 12.0–15.0)
Immature Granulocytes: 0 %
Lymphocytes Relative: 20 %
Lymphs Abs: 1.3 K/uL (ref 0.7–4.0)
MCH: 28.3 pg (ref 26.0–34.0)
MCHC: 33.6 g/dL (ref 30.0–36.0)
MCV: 84.4 fL (ref 80.0–100.0)
Monocytes Absolute: 0.5 K/uL (ref 0.1–1.0)
Monocytes Relative: 8 %
Neutro Abs: 4.6 K/uL (ref 1.7–7.7)
Neutrophils Relative %: 69 %
Platelet Count: 249 K/uL (ref 150–400)
RBC: 5.05 MIL/uL (ref 3.87–5.11)
RDW: 13.5 % (ref 11.5–15.5)
WBC Count: 6.6 K/uL (ref 4.0–10.5)
nRBC: 0 % (ref 0.0–0.2)

## 2024-10-15 MED ORDER — SODIUM CHLORIDE 0.9 % IV SOLN
1.0000 mg/kg | Freq: Once | INTRAVENOUS | Status: AC
  Administered 2024-10-15: 68 mg via INTRAVENOUS
  Filled 2024-10-15: qty 6.8

## 2024-10-15 MED ORDER — FAMOTIDINE IN NACL 20-0.9 MG/50ML-% IV SOLN
20.0000 mg | Freq: Once | INTRAVENOUS | Status: AC
  Administered 2024-10-15: 20 mg via INTRAVENOUS
  Filled 2024-10-15: qty 50

## 2024-10-15 MED ORDER — SODIUM CHLORIDE 0.9 % IV SOLN: INTRAVENOUS | Status: DC

## 2024-10-15 MED ORDER — LORAZEPAM 0.5 MG PO TABS: ORAL_TABLET | ORAL | 0 refills | Status: DC

## 2024-10-15 MED ORDER — SODIUM CHLORIDE 0.9 % IV SOLN
3.0000 mg/kg | Freq: Once | INTRAVENOUS | Status: AC
  Administered 2024-10-15: 200 mg via INTRAVENOUS
  Filled 2024-10-15: qty 40

## 2024-10-15 MED ORDER — DIPHENHYDRAMINE HCL 50 MG/ML IJ SOLN
25.0000 mg | Freq: Once | INTRAMUSCULAR | Status: AC
  Administered 2024-10-15: 25 mg via INTRAVENOUS
  Filled 2024-10-15: qty 1

## 2024-10-15 NOTE — Patient Instructions (Signed)
 [facility] - A DEPT OF [place]   Discharge Instructions: Thank you for choosing [facility] to provide your oncology and hematology care.   If you have a lab appointment with the Cancer Center, please go directly to the Cancer Center and check in at the registration area.   Wear comfortable clothing and clothing appropriate for easy access to any Portacath or PICC line.   We strive to give you quality time with your provider. You may need to reschedule your appointment if you arrive late (15 or more minutes).  Arriving late affects you and other patients whose appointments are after yours.  Also, if you miss three or more appointments without notifying the office, you may be dismissed from the clinic at the providers discretion.      For prescription refill requests, have your pharmacy contact our office and allow 72 hours for refills to be completed.    Today you received the following chemotherapy and/or immunotherapy agents: Nivolumab  (Opdivo ), Ipilimumab  (Yervoy )      To help prevent nausea and vomiting after your treatment, we encourage you to take your nausea medication as directed.  BELOW ARE SYMPTOMS THAT SHOULD BE REPORTED IMMEDIATELY: *FEVER GREATER THAN 100.4 F (38 C) OR HIGHER *CHILLS OR SWEATING *NAUSEA AND VOMITING THAT IS NOT CONTROLLED WITH YOUR NAUSEA MEDICATION *UNUSUAL SHORTNESS OF BREATH *UNUSUAL BRUISING OR BLEEDING *URINARY PROBLEMS (pain or burning when urinating, or frequent urination) *BOWEL PROBLEMS (unusual diarrhea, constipation, pain near the anus) TENDERNESS IN MOUTH AND THROAT WITH OR WITHOUT PRESENCE OF ULCERS (sore throat, sores in mouth, or a toothache) UNUSUAL RASH, SWELLING OR PAIN  UNUSUAL VAGINAL DISCHARGE OR ITCHING   Items with * indicate a potential emergency and should be followed up as soon as possible or go to the Emergency Department if any problems should occur.  Please show the  CHEMOTHERAPY ALERT CARD or IMMUNOTHERAPY ALERT CARD at check-in to the Emergency Department and triage nurse.  Should you have questions after your visit or need to cancel or reschedule your appointment, please contact [facility] - A DEPT OF [facility]  Dept: [phone]  and follow the prompts.  Office hours are 8:00 a.m. to 4:30 p.m. Monday - Friday. Please note that voicemails left after 4:00 p.m. may not be returned until the following business day.  We are closed weekends and major holidays. You have access to a nurse at all times for urgent questions. Please call the main number to the clinic Dept: [phone] and follow the prompts.   For any non-urgent questions, you may also contact your provider using MyChart. We now offer e-Visits for anyone 20 and older to request care online for non-urgent symptoms. For details visit [email or web address].   Also download the MyChart app! Go to the app store, search MyChart, open the app, select [place], and log in with your MyChart username and password.

## 2024-10-15 NOTE — Progress Notes (Signed)
 "     [facility] [phone]   [phone]  OFFICE PROGRESS NOTE  Sharon Lenis, MD [address] [place] [address]  DIAGNOSIS: Lower extremity melanoma diagnosed in 2017.  She developed stage IV disease with abdominal adenopathy in May of 2022 with BRAF negative.  1) Initially diagnosed with Superficial spreading melanoma of the lower extremity diagnosed in August 2017. She was found to have 1.25 mm thickness without ulceration or lymphovascular invasion. She had 1 out of 2 lymph nodes positive. The size of the lymph node measuring 0.25 x 0.95 mm with pathological staging T2bN1.  2) Superficial spreading melanoma of the right shoulder diagnosed in August 2017.She is status post wide local excision and found tohave a 0.72 mm. These findings indicate a pathological staging of T1 NA.    PRIOR THERAPY: 1) She is status post local excision followed by sentinel lymph node sampling with 2 lymph nodes sampled one was involved with metastatic melanoma. The measurement was 0.25 x 0.95 mm in dimension of the capsule. Her final pathological stage stage is T2bN1. This was completed on 05/23/2016.   2) Nivolumab  total of 240 mg dose every 2 weeks for total of 4 doses. Started on 07/21/2016.  She completed 4 cycles of therapy for a total of 2 months out of planned 12.  Patient opted out continuing immunotherapy.   3) She is status post lymph node biopsy completed on Feb 15, 2021.   4) Ipilimumab  3 mg/kg with a nivolumab  at 1 mg/kg started on Mar 04, 2021.  She is status post 4 cycles of therapy with excellent response and minimal residual disease.  5) Nivolumab  480 mg every 4 weeks started on May 27, 2021.  Status post 30 cycles with tentative plan to complete 2 years of therapy in April 2024.   CURRENT THERAPY: Immunotherapy again with ipilimumab  3 mg/KG and nivolumab  1 mg/KG every 3 weeks.  Status post 1 cycle.  INTERVAL HISTORY: Sharon Kirk 63 y.o. female returns to the clinic today for follow-up visit accompanied by her husband.Discussed the use of AI scribe software for clinical note transcription with the patient, who gave verbal consent to proceed.  History of Present Illness Sharon Kirk is a 63 year old female with stage IV melanoma (BRAF wild type) who presents for evaluation prior to cycle two of resumed ipilimumab  and nivolumab  immunotherapy.  She previously completed two years of avelumab and nivolumab  immunotherapy in April 2024, but subsequently experienced disease progression with inguinal lymphadenopathy. Immunotherapy was resumed with ipilimumab  and nivolumab , and she is now being evaluated four weeks after her first cycle of the resumed regimen.  Following her initial cycle, she developed a rash three days post-treatment, which improved after four to five days of topical corticosteroid therapy. She also experienced oral mucositis and significant fatigue.  Since the third day after her most recent treatment, she has had persistent blurred vision, describing her vision as 'a daze of darkness and clouds' with decreased visual acuity and nyctalopia. She avoids driving at night due to photophobia and uses night lights for safety when ambulating. These visual symptoms were not present during prior immunotherapy courses.  Her husband has observed increased forgetfulness since the first cycle of resumed immunotherapy. She continues to take vitamin D  and elderberry supplements and inquired about their safety. She uses lorazepam  as needed for MRI procedures due to claustrophobia.  She denies recent illness and has limited exposure during flu season by remaining  indoors over the holidays.    MEDICAL HISTORY: Past Medical History:  Diagnosis Date   Allergy    Cancer (HCC)    Melanoma   Hyperlipidemia    IBS (irritable bowel syndrome)    Osteopenia    PONV (postoperative nausea and vomiting)    Thyroid  nodule      ALLERGIES:  is allergic to codeine.  MEDICATIONS:  Current Outpatient Medications  Medication Sig Dispense Refill   albuterol  (VENTOLIN  HFA) 108 (90 Base) MCG/ACT inhaler Inhale 1-2 puffs into the lungs every 6 (six) hours as needed for wheezing or shortness of breath. 1 each 0   amoxicillin -clavulanate (AUGMENTIN ) 875-125 MG tablet Take 1 tablet by mouth every 12 (twelve) hours. 14 tablet 0   azithromycin  (ZITHROMAX ) 250 MG tablet Take 1 tablet (250 mg total) by mouth daily. Take first 2 tablets together, then 1 every day until finished. 6 tablet 0   benzonatate  (TESSALON ) 100 MG capsule Take 1 capsule (100 mg total) by mouth every 8 (eight) hours. 21 capsule 0   Biotin 1000 MCG CHEW Chew by mouth.     calcium carbonate (OS-CAL) 600 MG TABS Take 600 mg by mouth 2 (two) times daily with a meal.     cholecalciferol (VITAMIN D ) 1000 UNITS tablet Take 5,000 Units by mouth daily.     Cyanocobalamin  (VITAMIN B-12 CR PO) Take 1 tablet by mouth daily.     ELDERBERRY PO Take by mouth.     loratadine (CLARITIN) 10 MG tablet Take 10 mg by mouth daily.     Multiple Vitamins-Minerals (ZINC PO) Take by mouth.     Omega-3 Fatty Acids (FISH OIL) 1200 MG CAPS Take by mouth.     phenazopyridine  (PYRIDIUM ) 200 MG tablet Take 1 tablet (200 mg total) by mouth 3 (three) times daily. 6 tablet 0   No current facility-administered medications for this visit.    SURGICAL HISTORY:  Past Surgical History:  Procedure Laterality Date   ABDOMINAL HYSTERECTOMY  1991   DILATION AND CURETTAGE OF UTERUS  1988   IR US  GUIDANCE  02/15/2021   MELANOMA EXCISION Right 05/23/2016   Procedure: WIDE  EXCISION RIGHT SHOULDER MELANOMA AND WIDE EXCISION LEFT LOWER LEG MELANOMA;  Surgeon: Krystal Russell, MD;  Location: [facility];  Service: General;  Laterality: Right;  right shoulder and left lower leg sites   SENTINEL NODE BIOPSY Left 05/23/2016   Procedure: LEFT INGUINAL SENTINEL LYMPH  NODE BIOPSY;  Surgeon:  Krystal Russell, MD;  Location: [facility];  Service: General;  Laterality: Left;   THYROID  SURGERY  07/2015   NODULE    REVIEW OF SYSTEMS:  Constitutional: negative Eyes: positive for visual disturbance Ears, nose, mouth, throat, and face: negative Respiratory: negative Cardiovascular: negative Gastrointestinal: negative Genitourinary:negative Integument/breast: positive for rash Hematologic/lymphatic: negative Musculoskeletal:negative Neurological: negative Behavioral/Psych: negative Endocrine: negative Allergic/Immunologic: negative   PHYSICAL EXAMINATION: General appearance: alert, cooperative, and no distress Head: Normocephalic, without obvious abnormality, atraumatic Neck: no adenopathy, no JVD, supple, symmetrical, trachea midline, and thyroid  not enlarged, symmetric, no tenderness/mass/nodules Lymph nodes: Cervical, supraclavicular, and axillary nodes normal. Resp: clear to auscultation bilaterally Back: symmetric, no curvature. ROM normal. No CVA tenderness. Cardio: regular rate and rhythm, S1, S2 normal, no murmur, click, rub or gallop GI: soft, non-tender; bowel sounds normal; no masses,  no organomegaly Extremities: extremities normal, atraumatic, no cyanosis or edema Neurologic: Alert and oriented X 3, normal strength and tone. Normal symmetric reflexes. Normal coordination and gait  ECOG PERFORMANCE  STATUS: 1 - Symptomatic but completely ambulatory  Blood pressure 105/75, pulse 67, temperature 97.6 F (36.4 C), temperature source Temporal, resp. rate 17, height 5' 5 (1.651 m), weight 160 lb (72.6 kg), last menstrual period 06/30/1990, SpO2 100%.  LABORATORY DATA: Lab Results  Component Value Date   WBC 6.6 10/15/2024   HGB 14.3 10/15/2024   HCT 42.6 10/15/2024   MCV 84.4 10/15/2024   PLT 249 10/15/2024      Chemistry      Component Value Date/Time   NA 141 09/24/2024 1342   NA 140 09/08/2016 0755   K 3.7 09/24/2024 1342   K 3.9  09/08/2016 0755   CL 104 09/24/2024 1342   CO2 26 09/24/2024 1342   CO2 23 09/08/2016 0755   BUN 13 09/24/2024 1342   BUN 11.2 09/08/2016 0755   CREATININE 0.59 09/24/2024 1342   CREATININE 0.61 09/30/2019 0839   CREATININE 0.7 09/08/2016 0755      Component Value Date/Time   CALCIUM 9.7 09/24/2024 1342   CALCIUM 9.1 09/08/2016 0755   ALKPHOS 68 09/24/2024 1342   ALKPHOS 61 09/08/2016 0755   AST 18 09/24/2024 1342   AST 13 09/08/2016 0755   ALT 17 09/24/2024 1342   ALT 16 09/08/2016 0755   BILITOT 0.2 09/24/2024 1342   BILITOT 0.42 09/08/2016 0755       RADIOGRAPHIC STUDIES: No results found.   ASSESSMENT AND PLAN: This is a very pleasant 63 years old white female with Lower extremity melanoma diagnosed in 2017.  She developed stage IV disease with abdominal adenopathy in May of 2022 with BRAF negative.  1) Initially diagnosed with Superficial spreading melanoma of the lower extremity diagnosed in August 2017. She was found to have 1.25 mm thickness without ulceration or lymphovascular invasion. She had 1 out of 2 lymph nodes positive. The size of the lymph node measuring 0.25 x 0.95 mm with pathological staging T2bN1.  2) Superficial spreading melanoma of the right shoulder diagnosed in August 2017.She is status post wide local excision and found tohave a 0.72 mm. These findings indicate a pathological staging of T1 NA.  The patient is status post the following treatment. 1) status post local excision followed by sentinel lymph node sampling with 2 lymph nodes sampled one was involved with metastatic melanoma. The measurement was 0.25 x 0.95 mm in dimension of the capsule. Her final pathological stage stage is T2bN1. This was completed on 05/23/2016.   2) Nivolumab  total of 240 mg dose every 2 weeks for total of 4 doses. Started on 07/21/2016.  She completed 4 cycles of therapy for a total of 2 months out of planned 12.  Patient opted out continuing immunotherapy.   3) status  post lymph node biopsy completed on Feb 15, 2021.   4) Ipilimumab  3 mg/kg with a nivolumab  at 1 mg/kg started on Mar 04, 2021.  She is status post 4 cycles of therapy with excellent response and minimal residual disease.  5) she completed maintenance treatment with nivolumab  480 Mg IV every 4 weeks status post 30 cycles.  The patient has been tolerating this treatment well with no concerning adverse effects.  The patient is currently on observation. The patient is currently on observation.  She had repeat PET scan that showed hypermetabolic left iliac lymph node but no other evidence of metastatic disease. She resumed her treatment with immunotherapy with ipilimumab  3 mg/KG in addition to nivolumab  1 mg/KG every 3 weeks status post 1 cycle.  She  is tolerating her treatment well except for the blurred vision and mild skin rash. Assessment and Plan Assessment & Plan Stage IV melanoma, BRAF wild type Metastatic melanoma with progression following prior immunotherapy, currently receiving ipilimumab  and nivolumab . New onset blurred and decreased vision raises concern for central nervous system involvement or immune-mediated optic pathway toxicity. No recent brain imaging since symptom onset. Recent laboratory studies remain within normal limits. Provided education regarding infection risk during influenza season. Discussed rationale for current immunotherapy regimen, emphasizing prior efficacy and superior long-term outcomes compared to alternative regimens. - Ordered MRI of the brain to assess for CNS involvement in light of new visual symptoms. - If MRI is negative for CNS disease, recommended ophthalmology evaluation for further assessment of visual symptoms. - Reviewed recent laboratory results, which remain within normal limits. - Advised to minimize exposure to infectious agents during influenza season. - Encouraged continuation of vitamin D  and calcium supplementation; advised against use of  unproven supplements.  Adverse effects of immunotherapy Following first cycle of resumed immunotherapy, she developed rash, oral mucositis, significant fatigue, and new visual symptoms. Visual changes are concerning for immune-related toxicity or CNS metastasis and require prompt evaluation. Rash and oral mucositis are managed symptomatically. Provided anticipatory guidance regarding potential immune-mediated effects on the pituitary and optic pathways. - Advised continuation of topical corticosteroid cream for rash as needed. - Planned evaluation for CNS involvement with MRI of the brain due to visual symptoms. - If MRI is negative, recommended ophthalmology follow-up for further evaluation of visual symptoms. - Reviewed risk of immune-related toxicity affecting pituitary and optic pathways, warranting prompt evaluation of new symptoms. - Provided guidance on use of lorazepam  prior to MRI for claustrophobia. The patient was advised to call immediately if she has any concerning symptoms in the interval.  The patient voices understanding of current disease status and treatment options and is in agreement with the current care plan.  All questions were answered. The patient knows to call the clinic with any problems, questions or concerns. We can certainly see the patient much sooner if necessary.  The total time spent in the appointment was 30 minutes including review of chart and various tests results, discussions about plan of care and coordination of care plan .   Disclaimer: This note was dictated with voice recognition software. Similar sounding words can inadvertently be transcribed and may not be corrected upon review.        "

## 2024-10-15 NOTE — Progress Notes (Signed)
 Ipilimumab  (YERVOY ) Patient Monitoring Assessment   Is the patient experiencing any of the following general symptoms?:  [] Difficulty performing normal activities [ ] Feeling sluggish or cold all the time [] Unusual weight gain [X] Constant or unusual headaches [ ] Feeling dizzy or faint [X] Changes in eyesight (blurry vision, double vision, or other vision problems) [ ] Changes in mood or behavior (ex: decreased sex drive, irritability, or forgetfulness) [ ] Starting new medications (ex: steroids, other medications that lower immune response) [ ] Patient is not experiencing any of the general symptoms above.   Gastrointestinal  Patient is having 1 bowel movements each day.  Is this different from baseline? [ ] Yes [X] No Are your stools watery or do they have a foul smell? [ ] Yes [ XNo Have you seen blood in your stools? [ ] Yes [X] No Are your stools dark, tarry, or sticky? [ ] Yes [X] No Are you having pain or tenderness in your belly? [ ] Yes [X] No  Skin Does your skin itch? [] Yes [X] No Do you have a rash? [X] Yes [ ] No Has your skin blistered and/or peeled? [ ] Yes [X]  No Do you have sores in your mouth? [X] Yes [ ] No  Hepatic Has your urine been dark or tea colored? [ ] Yes [ ] No Have you noticed that your skin or the whites of your eyes are turning yellow? [ ] Yes [X] No Are you bleeding or bruising more easily than normal? [ ] Yes [X] No Are you nauseous and/or vomiting? [ ] Yes [X] No Do you have pain on the right side of your stomach? [ ] Yes [X] No  Neurologic  Are you having unusual weakness of legs, arms, or face? [ ] Yes [X] No Are you having numbness or tingling in your hands or feet? [ ] Yes [X] No  Dr. Sherrod made aware and okay to continue with Yervoy  infusion. Patient to have brain MRI scheduled.   Rocky LOISE Haggard

## 2024-10-15 NOTE — Telephone Encounter (Signed)
 Request anti-anxiety for claustrophobia because she has an MRI on Monday.

## 2024-10-19 ENCOUNTER — Other Ambulatory Visit: Payer: Self-pay

## 2024-10-20 ENCOUNTER — Ambulatory Visit (HOSPITAL_COMMUNITY)
Admission: RE | Admit: 2024-10-20 | Discharge: 2024-10-20 | Disposition: A | Discharge status: Home / Self Care | Source: Ambulatory Visit | Attending: Internal Medicine | Admitting: Internal Medicine

## 2024-10-20 ENCOUNTER — Encounter: Payer: Self-pay | Admitting: Internal Medicine

## 2024-10-20 DIAGNOSIS — C439 Malignant melanoma of skin, unspecified: Secondary | ICD-10-CM | POA: Insufficient documentation

## 2024-10-20 MED ORDER — GADOBUTROL 1 MMOL/ML IV SOLN
7.0000 mL | Freq: Once | INTRAVENOUS | Status: AC | PRN
  Administered 2024-10-20: 7 mL via INTRAVENOUS

## 2024-10-20 NOTE — Progress Notes (Signed)
 "  Sharon Kirk 1961/09/22 [id number]   History:  64 y.o. G0 presents for annual exam. Complains of sometimes feeling a pressure/bulge at vaginal opening. H/O chronic constipation, sometimes feels like stool gets stuck. Also feels like she does not fully empty her bladder. No leakage.  Postmenopausal - Stopped HRT after DVT in 2021 while hospitalized with Covid. H/O stage IV melanoma, restarted immunotherapy in December. It is affecting her vision. Prior 2017 superficial spreading tumor in lower extremity and right shoulder. S/P 1991 TVH. Normal pap history. Osteopenia of bilateral hips. Hypothyroidism managed by endocrinology.   Gynecologic History Patient's last menstrual period was 06/30/1990.   Contraception: status post hysterectomy Sexually active: Yes  Health maintenance Last Pap: 07/31/2011. Results were: Normal Last mammogram: 10/15/2023. Results were: Normal Last colonoscopy: 2020. Results were: Normal, 10-year recall Last Dexa: 12/05/2022. Results were: T-score -2.3, FRAX 12.0% / 2.0%  Past medical history, past surgical history, family history and social history were all reviewed and documented in the EPIC chart. Married. 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   ROS:  A ROS was performed and pertinent positives and negatives are included.  Exam:  Vitals:   10/21/24 0744  BP: 110/68  Pulse: 78  SpO2: 97%  Weight: 161 lb (73 kg)  Height: 5' 5.25 (1.657 m)       Body mass index is 26.59 kg/m.  General appearance:  Normal Thyroid :  Symmetrical, normal in size, without palpable masses or nodularity. Respiratory  Auscultation:  Clear without wheezing or rhonchi Cardiovascular  Auscultation:  Regular rate, without rubs, murmurs or gallops  Edema/varicosities:  Not grossly evident Abdominal  Soft,nontender, without masses, guarding or rebound.  Liver/spleen:  No organomegaly noted  Hernia:  None appreciated  Skin  Inspection:   Grossly normal   Breasts: Examined lying and sitting.   Right: Without masses, retractions, discharge or axillary adenopathy.   Left: Without masses, retractions, discharge or axillary adenopathy. Pelvic: External genitalia:  no lesions              Urethra:  normal appearing urethra with no masses, tenderness or lesions              Bartholins and Skenes: normal                 Vagina: normal appearing vagina with normal color and discharge, no lesions. Atrophic changes              Cervix: absent Bimanual Exam:  Uterus:  absent              Adnexa: no mass, fullness, tenderness              Rectovaginal: Deferred              Anus:  normal, no lesions  Zada Louder, CMA present as chaperone.   Assessment/Plan:  64 y.o. G0 for annual exam.   Encounter for breast and pelvic examination - Education provided on SBEs, importance of preventative screenings, current guidelines, high calcium diet, regular exercise, and multivitamin daily. Labs with PCP.   Postmenopausal - No HRT. S/P S/P 1991 TVH.  Osteopenia of multiple sites - Plan: DG Bone Density. 11/2022 T-score -2.3 without elevated FRAX. Continue Vitamin D  + Calcium and regular exercise.   Rectocele - grade 2. H/O chronic constipation. Discussed causes, S/S and management options. Declines PFPT at this time. Recommend splinting, squatty potty, stool softeners and/or miralax , increase fiber in diet. Sees  GI.   Baden-Walker grade 1 cystocele - Mild. Recommend double voiding.   Screening for cervical cancer - Normal pap history.  No longer screening per guidelines.  Screening for breast cancer - Normal mammogram history.  Continue annual screenings. Scheduled 1/13. Breast exam normal today.  Screening for colon cancer - Normal colonoscopy in 2020.  She will repeat at 10-year interval per GI's recommendation.   Return in about 2 years (around 10/21/2026) for B&P.    Sharon Kirk [facility], 8:50 AM 10/21/2024 "

## 2024-10-21 ENCOUNTER — Encounter: Payer: Self-pay | Admitting: Nurse Practitioner

## 2024-10-21 ENCOUNTER — Ambulatory Visit: Admitting: Nurse Practitioner

## 2024-10-21 ENCOUNTER — Encounter: Payer: Self-pay | Admitting: Internal Medicine

## 2024-10-21 ENCOUNTER — Other Ambulatory Visit: Payer: Self-pay | Admitting: Medical Oncology

## 2024-10-21 VITALS — BP 110/68 | HR 78 | Ht 65.25 in | Wt 161.0 lb

## 2024-10-21 DIAGNOSIS — M8589 Other specified disorders of bone density and structure, multiple sites: Secondary | ICD-10-CM | POA: Diagnosis not present

## 2024-10-21 DIAGNOSIS — Z780 Asymptomatic menopausal state: Secondary | ICD-10-CM | POA: Diagnosis not present

## 2024-10-21 DIAGNOSIS — N816 Rectocele: Secondary | ICD-10-CM

## 2024-10-21 DIAGNOSIS — Z01419 Encounter for gynecological examination (general) (routine) without abnormal findings: Secondary | ICD-10-CM

## 2024-10-21 DIAGNOSIS — N8110 Cystocele, unspecified: Secondary | ICD-10-CM

## 2024-10-21 DIAGNOSIS — H538 Other visual disturbances: Secondary | ICD-10-CM

## 2024-10-21 MED ORDER — METHYLPREDNISOLONE 4 MG PO TBPK: ORAL_TABLET | ORAL | 0 refills | Status: AC

## 2024-10-21 NOTE — Progress Notes (Signed)
 Pt notified to pick up medrol  dose pack today .

## 2024-10-22 ENCOUNTER — Telehealth: Payer: Self-pay | Admitting: Medical Oncology

## 2024-10-22 ENCOUNTER — Encounter: Payer: Self-pay | Admitting: Internal Medicine

## 2024-10-22 NOTE — Telephone Encounter (Signed)
 Per Dr. Sherrod , I told pt not to drive and to have someone pick up and start  a medrol  dose pack and he will f/u with MRI results.

## 2024-10-24 ENCOUNTER — Telehealth: Payer: Self-pay | Admitting: Medical Oncology

## 2024-10-24 NOTE — Telephone Encounter (Signed)
 Blurred vision is no better after medrol  dose pak . I asked radiology to expedite reading the MRI . PT is calling Dr. Octavia for an appt today .

## 2024-10-24 NOTE — Telephone Encounter (Signed)
 Pt informed

## 2024-10-24 NOTE — Telephone Encounter (Signed)
 Pt has opthamology appt today at 1130. She is calling for transportation. Pt  is going to see Ophthalmology today .

## 2024-10-28 ENCOUNTER — Ambulatory Visit
Admission: RE | Admit: 2024-10-28 | Discharge: 2024-10-28 | Disposition: A | Source: Ambulatory Visit | Attending: Nurse Practitioner

## 2024-10-28 DIAGNOSIS — Z1231 Encounter for screening mammogram for malignant neoplasm of breast: Secondary | ICD-10-CM

## 2024-10-31 ENCOUNTER — Other Ambulatory Visit: Payer: Self-pay

## 2024-11-01 NOTE — Progress Notes (Unsigned)
 [facility] OFFICE PROGRESS NOTE  Pura Lenis, MD [address] [place] [address]  DIAGNOSIS:  Lower extremity melanoma diagnosed in 2017.  She developed stage IV disease with abdominal adenopathy in May of 2022 with BRAF negative.  1) Initially diagnosed with Superficial spreading melanoma of the lower extremity diagnosed in August 2017. She was found to have 1.25 mm thickness without ulceration or lymphovascular invasion. She had 1 out of 2 lymph nodes positive. The size of the lymph node measuring 0.25 x 0.95 mm with pathological staging T2bN1.  2) Superficial spreading melanoma of the right shoulder diagnosed in August 2017.She is status post wide local excision and found tohave a 0.72 mm. These findings indicate a pathological staging of T1 NA.   PRIOR THERAPY: 1) She is status post local excision followed by sentinel lymph node sampling with 2 lymph nodes sampled one was involved with metastatic melanoma. The measurement was 0.25 x 0.95 mm in dimension of the capsule. Her final pathological stage stage is T2bN1. This was completed on 05/23/2016.  2) Nivolumab  total of 240 mg dose every 2 weeks for total of 4 doses. Started on 07/21/2016.  She completed 4 cycles of therapy for a total of 2 months out of planned 12.  Patient opted out continuing immunotherapy.  3) She is status post lymph node biopsy completed on Feb 15, 2021. 4) Ipilimumab  3 mg/kg with a nivolumab  at 1 mg/kg started on Mar 04, 2021.  She is status post 4 cycles of therapy with excellent response and minimal residual disease. 5) Nivolumab  480 mg every 4 weeks started on May 27, 2021.  Status post 30 cycles with tentative plan to complete 2 years of therapy in April 2024.  CURRENT THERAPY: Immunotherapy again with ipilimumab  3 mg/KG and nivolumab  1 mg/KG every 3 weeks. Status post 2 cycles.   INTERVAL HISTORY: Sharon Kirk 64 y.o. female returns to the clinic today for a follow up  visit. She was last seen in the clinic by Dr. Sherrod on 10/15/24. She is currently undergoing treatment with immunotherapy. In the interval since being seen, she called the clinic endorsing vision changes. She was given a medrol  dosepack without improvement. She had a brain MRI that was negative for metastatic disease. She saw her eye doctor who performed a throughout evaluation. She was told she had dry eye.   She developed significant visual disturbances following initiation of immunotherapy, with symptoms beginning after the first dose and worsening after the second. The most pronounced decline occurred at the end of December. She describes her vision as awful and really fuzzy, currently estimated at 70-80% of her baseline. Night vision is particularly impaired, with difficulty due to car lights and flashing sensations.    She remains able to drive but requires night vision glasses. She has mild cataracts without optic nerve involvement. Prescribed eye drops administered every eight hours have resulted in partial improvement (approximately 60% better than baseline), but symptoms persist. She has a follow-up ophthalmology appointment scheduled.  She also experiences persistent oral ulcers and lip swelling since starting immunotherapy, which she manages with ice cubes. These symptoms were absent with prior regimens, during which she only noted mild fatigue. She reports intermittent pruritus and a prior rash after her first immunotherapy treatment, which resolved with topical corticosteroids.  She denies fever, chills, new night sweats, unintentional weight loss, respiratory symptoms, gastrointestinal symptoms, or new rashes. Recent brain imaging was unremarkable.  She expresses concern regarding the  persistence of her visual symptoms and is interested in potential modifications to her immunotherapy regimen, including dose reduction or transition to single-agent therapy, as she did not experience  ocular symptoms with prior regimens. She prioritizes cancer control but acknowledges the significant impact of her visual symptoms on her quality of life and awaits further recommendations regarding her treatment plan.  The patient is here today for evaluation prior to starting cycle # 3.    MEDICAL HISTORY: Past Medical History:  Diagnosis Date   Allergy    Cancer (HCC)    Melanoma   Hyperlipidemia    IBS (irritable bowel syndrome)    Osteopenia    PONV (postoperative nausea and vomiting)    Thyroid  nodule     ALLERGIES:  is allergic to codeine.  MEDICATIONS:  Current Outpatient Medications  Medication Sig Dispense Refill   Ascorbic Acid (VITAMIN C PO) Take by mouth.     Biotin 1000 MCG CHEW Chew by mouth.     calcium carbonate (OS-CAL) 600 MG TABS Take 600 mg by mouth 2 (two) times daily with a meal.     cholecalciferol (VITAMIN D ) 1000 UNITS tablet Take 5,000 Units by mouth daily. (Patient taking differently: Take 1,000 Units by mouth daily.)     Cyanocobalamin  (VITAMIN B-12 CR PO) Take 1 tablet by mouth daily.     ELDERBERRY PO Take by mouth.     loratadine (CLARITIN) 10 MG tablet Take 10 mg by mouth daily.     methylPREDNISolone  (MEDROL  DOSEPAK) 4 MG TBPK tablet Take as directed on package 21 tablet 0   Multiple Vitamins-Minerals (ZINC PO) Take by mouth.     Omega-3 Fatty Acids (FISH OIL) 1200 MG CAPS Take by mouth.     No current facility-administered medications for this visit.   Facility-Administered Medications Ordered in Other Visits  Medication Dose Route Frequency Provider Last Rate Last Admin   0.9 %  sodium chloride  infusion   Intravenous Continuous Sherrod Sherrod, MD 10 mL/hr at 11/05/24 1208 New Bag at 11/05/24 1208   nivolumab  (OPDIVO ) 480 mg in sodium chloride  0.9 % 100 mL chemo infusion  480 mg Intravenous Once Sherrod Sherrod, MD        SURGICAL HISTORY:  Past Surgical History:  Procedure Laterality Date   ABDOMINAL HYSTERECTOMY  1991   DILATION  AND CURETTAGE OF UTERUS  1988   IR US  GUIDANCE  02/15/2021   MELANOMA EXCISION Right 05/23/2016   Procedure: WIDE  EXCISION RIGHT SHOULDER MELANOMA AND WIDE EXCISION LEFT LOWER LEG MELANOMA;  Surgeon: Krystal Russell, MD;  Location: [facility];  Service: General;  Laterality: Right;  right shoulder and left lower leg sites   SENTINEL NODE BIOPSY Left 05/23/2016   Procedure: LEFT INGUINAL SENTINEL LYMPH  NODE BIOPSY;  Surgeon: Krystal Russell, MD;  Location: [facility];  Service: General;  Laterality: Left;   THYROID  SURGERY  07/2015   NODULE    REVIEW OF SYSTEMS:   Review of Systems  Constitutional: Positive for fatigue. Negative for appetite change, chills, fever and unexpected weight change.  HENT: Positive for mouth sores. Negative for nosebleeds, sore throat and trouble swallowing.   Eyes: Positive for bilateral blurry vision. Negative for icterus.  Respiratory: Negative for cough, hemoptysis, shortness of breath and wheezing.   Cardiovascular: Negative for chest pain and leg swelling.  Gastrointestinal: Negative for abdominal pain, constipation, diarrhea, nausea and vomiting.  Genitourinary: Negative for bladder incontinence, difficulty urinating, dysuria, frequency and hematuria.   Musculoskeletal:  Negative for back pain, gait problem, neck pain and neck stiffness.  Skin: Negative for itching and rash.  Neurological: Negative for dizziness, extremity weakness, gait problem, headaches, light-headedness and seizures.  Hematological: Negative for adenopathy. Does not bruise/bleed easily.  Psychiatric/Behavioral: Negative for confusion, depression and sleep disturbance. The patient is not nervous/anxious.     PHYSICAL EXAMINATION:  Blood pressure 114/86, pulse 66, temperature (!) 97.1 F (36.2 C), temperature source Temporal, resp. rate 16, height 5' 5.25 (1.657 m), weight 160 lb 8 oz (72.8 kg), last menstrual period 06/30/1990, SpO2 100%.  ECOG PERFORMANCE  STATUS: 1  Physical Exam  Constitutional: Oriented to person, place, and time and well-developed, well-nourished, and in no distress.  HENT:  Head: Normocephalic and atraumatic.  Mouth/Throat: Oropharynx is clear and moist. No oropharyngeal exudate.  Eyes: Conjunctivae are normal. Right eye exhibits no discharge. Left eye exhibits no discharge. No scleral icterus.  Neck: Normal range of motion. Neck supple.  Cardiovascular: Normal rate, regular rhythm, normal heart sounds and intact distal pulses.   Pulmonary/Chest: Effort normal and breath sounds normal. No respiratory distress. No wheezes. No rales.  Abdominal: Soft. Bowel sounds are normal. Exhibits no distension and no mass. There is no tenderness.  Musculoskeletal: Normal range of motion. Exhibits no edema.  Lymphadenopathy:    No cervical adenopathy.  Neurological: Alert and oriented to person, place, and time. Exhibits normal muscle tone. Gait normal. Coordination normal.  Skin: Skin is warm and dry. No rash noted. Not diaphoretic. No erythema. No pallor.  Psychiatric: Mood, memory and judgment normal.  Vitals reviewed.  LABORATORY DATA: Lab Results  Component Value Date   WBC 6.3 11/05/2024   HGB 13.8 11/05/2024   HCT 41.1 11/05/2024   MCV 85.1 11/05/2024   PLT 237 11/05/2024      Chemistry      Component Value Date/Time   NA 141 11/05/2024 0955   NA 140 09/08/2016 0755   K 3.9 11/05/2024 0955   K 3.9 09/08/2016 0755   CL 103 11/05/2024 0955   CO2 27 11/05/2024 0955   CO2 23 09/08/2016 0755   BUN 13 11/05/2024 0955   BUN 11.2 09/08/2016 0755   CREATININE 0.60 11/05/2024 0955   CREATININE 0.61 09/30/2019 0839   CREATININE 0.7 09/08/2016 0755      Component Value Date/Time   CALCIUM 9.5 11/05/2024 0955   CALCIUM 9.1 09/08/2016 0755   ALKPHOS 73 11/05/2024 0955   ALKPHOS 61 09/08/2016 0755   AST 23 11/05/2024 0955   AST 13 09/08/2016 0755   ALT 27 11/05/2024 0955   ALT 16 09/08/2016 0755   BILITOT 0.3  11/05/2024 0955   BILITOT 0.42 09/08/2016 0755       RADIOGRAPHIC STUDIES:  MM 3D SCREENING MAMMOGRAM BILATERAL BREAST Result Date: 10/29/2024 CLINICAL DATA:  Screening. EXAM: DIGITAL SCREENING BILATERAL MAMMOGRAM WITH TOMOSYNTHESIS AND CAD TECHNIQUE: Bilateral screening digital craniocaudal and mediolateral oblique mammograms were obtained. Bilateral screening digital breast tomosynthesis was performed. The images were evaluated with computer-aided detection. COMPARISON:  Previous exam(s). ACR Breast Density Category c: The breasts are heterogeneously dense, which may obscure small masses. FINDINGS: There are no findings suspicious for malignancy. IMPRESSION: No mammographic evidence of malignancy. A result letter of this screening mammogram will be mailed directly to the patient. RECOMMENDATION: Screening mammogram in one year. (Code:SM-B-01Y) BI-RADS CATEGORY  1: Negative. Electronically Signed   By: Dina  Arceo M.D.   On: 10/29/2024 17:09   MR BRAIN W WO CONTRAST Result Date: 10/24/2024 EXAM: MRI  BRAIN WITH AND WITHOUT CONTRAST 10/20/2024 07:22:08 AM TECHNIQUE: Multiplanar multisequence MRI of the head/brain was performed with and without the administration of intravenous contrast. CONTRAST: 7 mL of Gadavist . COMPARISON: MR Head 03/01/2021. CLINICAL HISTORY: Melanoma, stage IIB/III/IV, monitor. FINDINGS: BRAIN AND VENTRICLES: There is no evidence of an acute infarct, intracranial hemorrhage, mass, midline shift, hydrocephalus, or extra-axial fluid collection. Scattered small T2 hyperintensities in the cerebral white matter are unchanged and nonspecific but may reflect minimal chronic small vessel ischemic disease. Cerebral volume is within normal limits for age. No abnormal enhancement is identified. Major intracranial vascular flow voids are preserved. ORBITS: No acute abnormality. SINUSES: Moderate bilateral ethmoid and mild maxillary sinus mucosal thickening. No significant mastoid fluid. BONES AND  SOFT TISSUES: Normal bone marrow signal and enhancement. No acute soft tissue abnormality. IMPRESSION: 1. No evidence of intracranial metastatic disease. Electronically signed by: Dasie Hamburg MD MD 10/24/2024 11:55 AM EST RP Workstation: [id number]     ASSESSMENT/PLAN:  This is a very pleasant 64 year old Caucasian female with Lower extremity melanoma diagnosed in 2017.  She developed stage IV disease with abdominal adenopathy in May of 2022 with BRAF negative.  1) Initially diagnosed with Superficial spreading melanoma of the lower extremity diagnosed in August 2017. She was found to have 1.25 mm thickness without ulceration or lymphovascular invasion. She had 1 out of 2 lymph nodes positive. The size of the lymph node measuring 0.25 x 0.95 mm with pathological staging T2bN1.  2) Superficial spreading melanoma of the right shoulder diagnosed in August 2017.She is status post wide local excision and found tohave a 0.72 mm. These findings indicate a pathological staging of T1 NA.  The patient is status post the following treatment. 1) status post local excision followed by sentinel lymph node sampling with 2 lymph nodes sampled one was involved with metastatic melanoma. The measurement was 0.25 x 0.95 mm in dimension of the capsule. Her final pathological stage stage is T2bN1. This was completed on 05/23/2016.   2) Nivolumab  total of 240 mg dose every 2 weeks for total of 4 doses. Started on 07/21/2016.  She completed 4 cycles of therapy for a total of 2 months out of planned 12.  Patient opted out continuing immunotherapy.   3) status post lymph node biopsy completed on Feb 15, 2021.   4) Ipilimumab  3 mg/kg with a nivolumab  at 1 mg/kg started on Mar 04, 2021.  She is status post 4 cycles of therapy with excellent response and minimal residual disease.   5) she completed maintenance treatment with nivolumab  480 Mg IV every 4 weeks status post 30 cycles.    She had repeat PET scan that showed  hypermetabolic left iliac lymph node but no other evidence of metastatic disease. She resumed her treatment with immunotherapy with ipilimumab  3 mg/KG in addition to nivolumab  1 mg/KG every 3 weeks status post 2 cycle.sin November 2025. We will discontinue ipilimumab  from cycle #3 due to concerns related to her vision changes.   The patient was seen with Dr. Sherrod. Labs were reviewed. Recommend she proceed with cycle #3 today as scheduled but we will discontinue the ipilimumab  from her care plan due to her vision changes.   We will see her back in 4 weeks for evaluation and repeat blood work before undergoing cycle #4.  I will arrange for a restaging CT scan of the CAP prior to the next cycle of treatment.   - Reviewed ophthalmology findings confirming dry eye syndrome. - Instructed her  to continue prescribed eye drops and follow up with ophthalmology.  The patient was advised to call immediately if she has any concerning symptoms in the interval. The patient voices understanding of current disease status and treatment options and is in agreement with the current care plan. All questions were answered. The patient knows to call the clinic with any problems, questions or concerns. We can certainly see the patient much sooner if necessary    Orders Placed This Encounter  Procedures   CT CHEST ABDOMEN PELVIS W CONTRAST    Standing Status:   Future    Expected Date:   11/26/2024    Expiration Date:   11/05/2025    If indicated for the ordered procedure, I authorize the administration of contrast media per Radiology protocol:   Yes    Does the patient have a contrast media/X-ray dye allergy?:   No    Preferred imaging location?:   [facility]    If indicated for the ordered procedure, I authorize the administration of oral contrast media per Radiology protocol:   Yes      Makhiya Coburn L Bud Kaeser, PA-C 11/05/24  ADDENDUM: Hematology/Oncology Attending: I had a face-to-face  encounter with the patient today.  I reviewed her records, recent imaging studies and lab and recommended her care plan.  This is a very pleasant 64 years old white female with history of lower extremity melanoma diagnosed in 2017 with evidence of metastatic disease in May 2022 with negative BRAF mutation.  She was treated with a course of immunotherapy in the past with ipilimumab  and nivolumab  followed by maintenance nivolumab  480 mg IV every 4 weeks and completed 2 years of treatment in April 2024.  She was on observation since that time until she had evidence for disease progression with enlarging and intensely hypermetabolic left external iliac lymph node consistent with metastatic disease but no other additional metabolic lymphadenopathy.  The patient resumed her treatment with ipilimumab  and nivolumab  every 3 weeks status post 2 cycles.  She has been complaining complaining of blurry vision recently.  We did order MRI of the brain to rule out any brain metastasis or hypophysitis and there was no concerning findings on the MRI of the brain.  She was seen by her ophthalmologist and found to have dry eye and she was given prescription for eyedrops with some improvement in her visual changes but no complete resolution.  I had a lengthy discussion with the patient today about her current condition and treatment options.  I recommended for her to drop the epidurogram from her treatment because of the dry eye and toxicity.  We we will continue her current treatment with nivolumab  maintenance every 4 weeks starting from cycle #3. The patient will come back for follow-up visit in 4 weeks for evaluation before starting cycle #4. She was advised to call immediately if she has any other concerning symptoms in the interval. Disclaimer: This note was dictated with voice recognition software. Similar sounding words can inadvertently be transcribed and may be missed upon review. Sherrod MARLA Sherrod, MD

## 2024-11-05 ENCOUNTER — Inpatient Hospital Stay: Attending: Internal Medicine

## 2024-11-05 ENCOUNTER — Inpatient Hospital Stay

## 2024-11-05 ENCOUNTER — Inpatient Hospital Stay: Admitting: Physician Assistant

## 2024-11-05 VITALS — BP 121/76 | HR 90 | Resp 16

## 2024-11-05 VITALS — BP 114/86 | HR 66 | Temp 97.1°F | Resp 16 | Ht 65.25 in | Wt 160.5 lb

## 2024-11-05 DIAGNOSIS — Z5112 Encounter for antineoplastic immunotherapy: Secondary | ICD-10-CM

## 2024-11-05 DIAGNOSIS — C439 Malignant melanoma of skin, unspecified: Secondary | ICD-10-CM

## 2024-11-05 DIAGNOSIS — C4370 Malignant melanoma of unspecified lower limb, including hip: Secondary | ICD-10-CM | POA: Diagnosis present

## 2024-11-05 DIAGNOSIS — C3490 Malignant neoplasm of unspecified part of unspecified bronchus or lung: Secondary | ICD-10-CM | POA: Diagnosis not present

## 2024-11-05 DIAGNOSIS — H269 Unspecified cataract: Secondary | ICD-10-CM | POA: Insufficient documentation

## 2024-11-05 DIAGNOSIS — Z79620 Long term (current) use of immunosuppressive biologic: Secondary | ICD-10-CM | POA: Insufficient documentation

## 2024-11-05 LAB — CBC WITH DIFFERENTIAL (CANCER CENTER ONLY)
Abs Immature Granulocytes: 0.01 K/uL (ref 0.00–0.07)
Basophils Absolute: 0.1 K/uL (ref 0.0–0.1)
Basophils Relative: 1 %
Eosinophils Absolute: 0.2 K/uL (ref 0.0–0.5)
Eosinophils Relative: 2 %
HCT: 41.1 % (ref 36.0–46.0)
Hemoglobin: 13.8 g/dL (ref 12.0–15.0)
Immature Granulocytes: 0 %
Lymphocytes Relative: 23 %
Lymphs Abs: 1.4 K/uL (ref 0.7–4.0)
MCH: 28.6 pg (ref 26.0–34.0)
MCHC: 33.6 g/dL (ref 30.0–36.0)
MCV: 85.1 fL (ref 80.0–100.0)
Monocytes Absolute: 0.6 K/uL (ref 0.1–1.0)
Monocytes Relative: 9 %
Neutro Abs: 4.1 K/uL (ref 1.7–7.7)
Neutrophils Relative %: 65 %
Platelet Count: 237 K/uL (ref 150–400)
RBC: 4.83 MIL/uL (ref 3.87–5.11)
RDW: 13.9 % (ref 11.5–15.5)
WBC Count: 6.3 K/uL (ref 4.0–10.5)
nRBC: 0 % (ref 0.0–0.2)

## 2024-11-05 LAB — CMP (CANCER CENTER ONLY)
ALT: 27 U/L (ref 0–44)
AST: 23 U/L (ref 15–41)
Albumin: 4.3 g/dL (ref 3.5–5.0)
Alkaline Phosphatase: 73 U/L (ref 38–126)
Anion gap: 12 (ref 5–15)
BUN: 13 mg/dL (ref 8–23)
CO2: 27 mmol/L (ref 22–32)
Calcium: 9.5 mg/dL (ref 8.9–10.3)
Chloride: 103 mmol/L (ref 98–111)
Creatinine: 0.6 mg/dL (ref 0.44–1.00)
GFR, Estimated: >60 mL/min
Glucose, Bld: 88 mg/dL (ref 70–99)
Potassium: 3.9 mmol/L (ref 3.5–5.1)
Sodium: 141 mmol/L (ref 135–145)
Total Bilirubin: 0.3 mg/dL (ref 0.0–1.2)
Total Protein: 7.1 g/dL (ref 6.5–8.1)

## 2024-11-05 LAB — TSH: TSH: 1.77 u[IU]/mL (ref 0.350–4.500)

## 2024-11-05 MED ORDER — SODIUM CHLORIDE 0.9 % IV SOLN: INTRAVENOUS | Status: DC

## 2024-11-05 MED ORDER — SODIUM CHLORIDE 0.9 % IV SOLN
480.0000 mg | Freq: Once | INTRAVENOUS | Status: AC
  Administered 2024-11-05: 480 mg via INTRAVENOUS
  Filled 2024-11-05: qty 48

## 2024-11-05 NOTE — Patient Instructions (Signed)
 [facility] - A DEPT OF [facility]   Discharge Instructions: Thank you for choosing [facility] to provide your oncology and hematology care.   If you have a lab appointment with the Cancer Center, please go directly to the Cancer Center and check in at the registration area.   Wear comfortable clothing and clothing appropriate for easy access to any Portacath or PICC line.   We strive to give you quality time with your provider. You may need to reschedule your appointment if you arrive late (15 or more minutes).  Arriving late affects you and other patients whose appointments are after yours.  Also, if you miss three or more appointments without notifying the office, you may be dismissed from the clinic at the provider's discretion.      For prescription refill requests, have your pharmacy contact our office and allow 72 hours for refills to be completed.    Today you received the following chemotherapy and/or immunotherapy agents: Nivolumab (Opdivo)      To help prevent nausea and vomiting after your treatment, we encourage you to take your nausea medication as directed.  BELOW ARE SYMPTOMS THAT SHOULD BE REPORTED IMMEDIATELY: *FEVER GREATER THAN 100.4 F (38 C) OR HIGHER *CHILLS OR SWEATING *NAUSEA AND VOMITING THAT IS NOT CONTROLLED WITH YOUR NAUSEA MEDICATION *UNUSUAL SHORTNESS OF BREATH *UNUSUAL BRUISING OR BLEEDING *URINARY PROBLEMS (pain or burning when urinating, or frequent urination) *BOWEL PROBLEMS (unusual diarrhea, constipation, pain near the anus) TENDERNESS IN MOUTH AND THROAT WITH OR WITHOUT PRESENCE OF ULCERS (sore throat, sores in mouth, or a toothache) UNUSUAL RASH, SWELLING OR PAIN  UNUSUAL VAGINAL DISCHARGE OR ITCHING   Items with * indicate a potential emergency and should be followed up as soon as possible or go to the Emergency Department if any problems should occur.  Please show the CHEMOTHERAPY ALERT CARD or  IMMUNOTHERAPY ALERT CARD at check-in to the Emergency Department and triage nurse.  Should you have questions after your visit or need to cancel or reschedule your appointment, please contact [facility] - A DE[facility]  Dept: [phone]  and follow the prompts.  Office hours are 8:00 a.m. to 4:30 p.m. Monday - Friday. Please note that voicemails left after 4:00 p.m. may not be returned until the following business day.  We are closed weekends and major holidays. You have access to a nurse at all times for urgent questions. Please call the main number to the clinic Dept: [phone] and follow the prompts.   For any non-urgent questions, you may also contact your provider using MyChart. We now offer e-Visits for anyone 4 and older to request care online for non-urgent symptoms. For details visit [email or web address].   Also download the MyChart app! Go to the app store, search "MyChart", open the app, select [place], and log in with your MyChart username and password.

## 2024-11-06 LAB — T4: T4, Total: 7.4 ug/dL (ref 4.5–12.0)

## 2024-11-07 ENCOUNTER — Other Ambulatory Visit: Payer: Self-pay

## 2024-11-11 ENCOUNTER — Other Ambulatory Visit: Payer: Self-pay

## 2024-11-26 ENCOUNTER — Ambulatory Visit (HOSPITAL_COMMUNITY)

## 2024-11-26 ENCOUNTER — Inpatient Hospital Stay

## 2024-11-26 ENCOUNTER — Inpatient Hospital Stay: Admitting: Physician Assistant

## 2024-12-03 ENCOUNTER — Inpatient Hospital Stay: Attending: Internal Medicine

## 2024-12-03 ENCOUNTER — Inpatient Hospital Stay: Admitting: Physician Assistant

## 2024-12-03 ENCOUNTER — Inpatient Hospital Stay

## 2024-12-17 ENCOUNTER — Inpatient Hospital Stay

## 2024-12-17 ENCOUNTER — Inpatient Hospital Stay: Admitting: Internal Medicine

## 2024-12-31 ENCOUNTER — Inpatient Hospital Stay: Attending: Internal Medicine

## 2024-12-31 ENCOUNTER — Inpatient Hospital Stay

## 2024-12-31 ENCOUNTER — Inpatient Hospital Stay: Admitting: Internal Medicine
